# Patient Record
Sex: Male | Born: 1935 | Race: White | Hispanic: No | Marital: Single | Smoking: Former smoker
Health system: Southern US, Community
[De-identification: ages and names within clinical notes are randomized; demographics above are authoritative.]

## PROBLEM LIST (undated history)

## (undated) DIAGNOSIS — Z87442 Personal history of urinary calculi: Secondary | ICD-10-CM

## (undated) DIAGNOSIS — E785 Hyperlipidemia, unspecified: Secondary | ICD-10-CM

## (undated) DIAGNOSIS — G8929 Other chronic pain: Secondary | ICD-10-CM

## (undated) DIAGNOSIS — N400 Enlarged prostate without lower urinary tract symptoms: Secondary | ICD-10-CM

## (undated) DIAGNOSIS — G4730 Sleep apnea, unspecified: Secondary | ICD-10-CM

## (undated) DIAGNOSIS — N183 Chronic kidney disease, stage 3 unspecified: Secondary | ICD-10-CM

## (undated) DIAGNOSIS — I10 Essential (primary) hypertension: Secondary | ICD-10-CM

## (undated) DIAGNOSIS — K3189 Other diseases of stomach and duodenum: Secondary | ICD-10-CM

## (undated) DIAGNOSIS — Z86718 Personal history of other venous thrombosis and embolism: Secondary | ICD-10-CM

## (undated) DIAGNOSIS — K219 Gastro-esophageal reflux disease without esophagitis: Secondary | ICD-10-CM

## (undated) DIAGNOSIS — M549 Dorsalgia, unspecified: Secondary | ICD-10-CM

## (undated) DIAGNOSIS — E119 Type 2 diabetes mellitus without complications: Secondary | ICD-10-CM

## (undated) DIAGNOSIS — I2510 Atherosclerotic heart disease of native coronary artery without angina pectoris: Secondary | ICD-10-CM

## (undated) DIAGNOSIS — H9190 Unspecified hearing loss, unspecified ear: Secondary | ICD-10-CM

## (undated) DIAGNOSIS — I5032 Chronic diastolic (congestive) heart failure: Secondary | ICD-10-CM

## (undated) DIAGNOSIS — S72009A Fracture of unspecified part of neck of unspecified femur, initial encounter for closed fracture: Secondary | ICD-10-CM

## (undated) DIAGNOSIS — I4819 Other persistent atrial fibrillation: Secondary | ICD-10-CM

## (undated) DIAGNOSIS — L0390 Cellulitis, unspecified: Secondary | ICD-10-CM

## (undated) DIAGNOSIS — N390 Urinary tract infection, site not specified: Secondary | ICD-10-CM

## (undated) DIAGNOSIS — Z951 Presence of aortocoronary bypass graft: Secondary | ICD-10-CM

## (undated) DIAGNOSIS — J449 Chronic obstructive pulmonary disease, unspecified: Secondary | ICD-10-CM

## (undated) HISTORY — PX: CORONARY ARTERY BYPASS GRAFT: SHX141

## (undated) HISTORY — DX: Type 2 diabetes mellitus without complications: E11.9

## (undated) HISTORY — DX: Essential (primary) hypertension: I10

## (undated) HISTORY — DX: Atherosclerotic heart disease of native coronary artery without angina pectoris: I25.10

## (undated) HISTORY — DX: Hyperlipidemia, unspecified: E78.5

## (undated) HISTORY — DX: Personal history of other venous thrombosis and embolism: Z86.718

## (undated) HISTORY — DX: Presence of aortocoronary bypass graft: Z95.1

---

## 2000-12-17 ENCOUNTER — Ambulatory Visit (HOSPITAL_COMMUNITY): Admission: RE | Admit: 2000-12-17 | Discharge: 2000-12-17 | Payer: Self-pay | Admitting: Internal Medicine

## 2000-12-17 ENCOUNTER — Encounter: Payer: Self-pay | Admitting: *Deleted

## 2000-12-17 ENCOUNTER — Inpatient Hospital Stay (HOSPITAL_COMMUNITY): Admission: AD | Admit: 2000-12-17 | Discharge: 2000-12-23 | Payer: Self-pay | Admitting: Cardiology

## 2000-12-19 ENCOUNTER — Encounter: Payer: Self-pay | Admitting: Cardiology

## 2000-12-30 ENCOUNTER — Inpatient Hospital Stay (HOSPITAL_COMMUNITY): Admission: AD | Admit: 2000-12-30 | Discharge: 2001-01-05 | Payer: Self-pay | Admitting: *Deleted

## 2001-05-09 ENCOUNTER — Observation Stay (HOSPITAL_COMMUNITY): Admission: AD | Admit: 2001-05-09 | Discharge: 2001-05-11 | Payer: Self-pay | Admitting: Cardiology

## 2001-05-09 ENCOUNTER — Encounter: Payer: Self-pay | Admitting: Cardiology

## 2001-05-16 ENCOUNTER — Encounter: Payer: Self-pay | Admitting: Family Medicine

## 2001-05-16 ENCOUNTER — Ambulatory Visit (HOSPITAL_COMMUNITY): Admission: RE | Admit: 2001-05-16 | Discharge: 2001-05-16 | Payer: Self-pay | Admitting: Family Medicine

## 2002-01-05 HISTORY — PX: COLONOSCOPY: SHX174

## 2002-05-08 ENCOUNTER — Ambulatory Visit (HOSPITAL_COMMUNITY): Admission: RE | Admit: 2002-05-08 | Discharge: 2002-05-08 | Payer: Self-pay | Admitting: Internal Medicine

## 2002-06-29 ENCOUNTER — Ambulatory Visit (HOSPITAL_COMMUNITY): Admission: RE | Admit: 2002-06-29 | Discharge: 2002-06-30 | Payer: Self-pay | Admitting: Cardiology

## 2002-11-10 ENCOUNTER — Observation Stay (HOSPITAL_COMMUNITY): Admission: EM | Admit: 2002-11-10 | Discharge: 2002-11-10 | Payer: Self-pay | Admitting: Emergency Medicine

## 2009-01-05 HISTORY — PX: CORONARY ARTERY BYPASS GRAFT: SHX141

## 2009-01-28 ENCOUNTER — Inpatient Hospital Stay (HOSPITAL_COMMUNITY): Admission: EM | Admit: 2009-01-28 | Discharge: 2009-02-07 | Payer: Self-pay | Admitting: Emergency Medicine

## 2009-01-30 ENCOUNTER — Encounter (INDEPENDENT_AMBULATORY_CARE_PROVIDER_SITE_OTHER): Payer: Self-pay | Admitting: Cardiovascular Disease

## 2009-01-30 ENCOUNTER — Ambulatory Visit: Payer: Self-pay | Admitting: Cardiothoracic Surgery

## 2009-01-31 ENCOUNTER — Encounter: Payer: Self-pay | Admitting: Cardiothoracic Surgery

## 2009-02-27 ENCOUNTER — Encounter: Admission: RE | Admit: 2009-02-27 | Discharge: 2009-02-27 | Payer: Self-pay | Admitting: Cardiothoracic Surgery

## 2009-02-27 ENCOUNTER — Ambulatory Visit: Payer: Self-pay | Admitting: Cardiothoracic Surgery

## 2009-07-03 ENCOUNTER — Encounter: Admission: RE | Admit: 2009-07-03 | Discharge: 2009-07-03 | Payer: Self-pay | Admitting: Cardiovascular Disease

## 2010-03-23 LAB — POCT I-STAT 3, ART BLOOD GAS (G3+)
Acid-base deficit: 1 mmol/L (ref 0.0–2.0)
Acid-base deficit: 2 mmol/L (ref 0.0–2.0)
O2 Saturation: 85 %
O2 Saturation: 93 %
O2 Saturation: 94 %
O2 Saturation: 96 %
Patient temperature: 35.7
Patient temperature: 36.3
Patient temperature: 38
Patient temperature: 38
TCO2: 24 mmol/L (ref 0–100)
TCO2: 25 mmol/L (ref 0–100)
TCO2: 26 mmol/L (ref 0–100)
TCO2: 26 mmol/L (ref 0–100)
pCO2 arterial: 38.8 mmHg (ref 35.0–45.0)
pCO2 arterial: 43.1 mmHg (ref 35.0–45.0)
pCO2 arterial: 45.2 mmHg — ABNORMAL HIGH (ref 35.0–45.0)
pCO2 arterial: 46.3 mmHg — ABNORMAL HIGH (ref 35.0–45.0)
pH, Arterial: 7.321 — ABNORMAL LOW (ref 7.350–7.450)
pH, Arterial: 7.339 — ABNORMAL LOW (ref 7.350–7.450)
pH, Arterial: 7.396 (ref 7.350–7.450)
pH, Arterial: 7.412 (ref 7.350–7.450)

## 2010-03-23 LAB — POCT I-STAT, CHEM 8
BUN: 13 mg/dL (ref 6–23)
BUN: 13 mg/dL (ref 6–23)
Calcium, Ion: 1.12 mmol/L (ref 1.12–1.32)
Calcium, Ion: 1.16 mmol/L (ref 1.12–1.32)
Chloride: 101 mEq/L (ref 96–112)
Creatinine, Ser: 0.9 mg/dL (ref 0.4–1.5)
Glucose, Bld: 173 mg/dL — ABNORMAL HIGH (ref 70–99)
Glucose, Bld: 223 mg/dL — ABNORMAL HIGH (ref 70–99)
Hemoglobin: 10.5 g/dL — ABNORMAL LOW (ref 13.0–17.0)
TCO2: 24 mmol/L (ref 0–100)

## 2010-03-23 LAB — CBC
HCT: 29.7 % — ABNORMAL LOW (ref 39.0–52.0)
HCT: 30.5 % — ABNORMAL LOW (ref 39.0–52.0)
HCT: 31 % — ABNORMAL LOW (ref 39.0–52.0)
HCT: 31.3 % — ABNORMAL LOW (ref 39.0–52.0)
HCT: 32.9 % — ABNORMAL LOW (ref 39.0–52.0)
HCT: 36.7 % — ABNORMAL LOW (ref 39.0–52.0)
HCT: 36.8 % — ABNORMAL LOW (ref 39.0–52.0)
HCT: 38.1 % — ABNORMAL LOW (ref 39.0–52.0)
HCT: 40.4 % (ref 39.0–52.0)
Hemoglobin: 10 g/dL — ABNORMAL LOW (ref 13.0–17.0)
Hemoglobin: 10.2 g/dL — ABNORMAL LOW (ref 13.0–17.0)
Hemoglobin: 10.3 g/dL — ABNORMAL LOW (ref 13.0–17.0)
Hemoglobin: 10.4 g/dL — ABNORMAL LOW (ref 13.0–17.0)
Hemoglobin: 12.8 g/dL — ABNORMAL LOW (ref 13.0–17.0)
Hemoglobin: 12.9 g/dL — ABNORMAL LOW (ref 13.0–17.0)
Hemoglobin: 13.3 g/dL (ref 13.0–17.0)
Hemoglobin: 13.6 g/dL (ref 13.0–17.0)
Hemoglobin: 13.7 g/dL (ref 13.0–17.0)
MCHC: 33.2 g/dL (ref 30.0–36.0)
MCHC: 33.3 g/dL (ref 30.0–36.0)
MCHC: 33.6 g/dL (ref 30.0–36.0)
MCHC: 33.8 g/dL (ref 30.0–36.0)
MCHC: 34.8 g/dL (ref 30.0–36.0)
MCHC: 34.9 g/dL (ref 30.0–36.0)
MCV: 85.6 fL (ref 78.0–100.0)
MCV: 85.8 fL (ref 78.0–100.0)
MCV: 86.7 fL (ref 78.0–100.0)
MCV: 87.6 fL (ref 78.0–100.0)
MCV: 87.9 fL (ref 78.0–100.0)
MCV: 88.2 fL (ref 78.0–100.0)
Platelets: 109 10*3/uL — ABNORMAL LOW (ref 150–400)
Platelets: 112 10*3/uL — ABNORMAL LOW (ref 150–400)
Platelets: 113 10*3/uL — ABNORMAL LOW (ref 150–400)
Platelets: 123 10*3/uL — ABNORMAL LOW (ref 150–400)
Platelets: 132 10*3/uL — ABNORMAL LOW (ref 150–400)
Platelets: 139 10*3/uL — ABNORMAL LOW (ref 150–400)
Platelets: 148 10*3/uL — ABNORMAL LOW (ref 150–400)
Platelets: 157 10*3/uL (ref 150–400)
Platelets: 215 10*3/uL (ref 150–400)
RBC: 3.38 MIL/uL — ABNORMAL LOW (ref 4.22–5.81)
RBC: 3.46 MIL/uL — ABNORMAL LOW (ref 4.22–5.81)
RBC: 3.54 MIL/uL — ABNORMAL LOW (ref 4.22–5.81)
RBC: 3.61 MIL/uL — ABNORMAL LOW (ref 4.22–5.81)
RBC: 4.28 MIL/uL (ref 4.22–5.81)
RBC: 4.28 MIL/uL (ref 4.22–5.81)
RBC: 4.44 MIL/uL (ref 4.22–5.81)
RDW: 13 % (ref 11.5–15.5)
RDW: 13.3 % (ref 11.5–15.5)
RDW: 13.4 % (ref 11.5–15.5)
RDW: 13.4 % (ref 11.5–15.5)
RDW: 13.4 % (ref 11.5–15.5)
RDW: 13.4 % (ref 11.5–15.5)
RDW: 13.5 % (ref 11.5–15.5)
RDW: 13.6 % (ref 11.5–15.5)
RDW: 13.6 % (ref 11.5–15.5)
WBC: 10.2 10*3/uL (ref 4.0–10.5)
WBC: 10.4 10*3/uL (ref 4.0–10.5)
WBC: 10.9 10*3/uL — ABNORMAL HIGH (ref 4.0–10.5)
WBC: 5.1 10*3/uL (ref 4.0–10.5)
WBC: 6.5 10*3/uL (ref 4.0–10.5)
WBC: 7.6 10*3/uL (ref 4.0–10.5)
WBC: 9.2 10*3/uL (ref 4.0–10.5)
WBC: 9.4 10*3/uL (ref 4.0–10.5)

## 2010-03-23 LAB — BASIC METABOLIC PANEL
BUN: 10 mg/dL (ref 6–23)
BUN: 12 mg/dL (ref 6–23)
BUN: 16 mg/dL (ref 6–23)
BUN: 22 mg/dL (ref 6–23)
CO2: 26 mEq/L (ref 19–32)
CO2: 31 mEq/L (ref 19–32)
Calcium: 7.8 mg/dL — ABNORMAL LOW (ref 8.4–10.5)
Calcium: 8 mg/dL — ABNORMAL LOW (ref 8.4–10.5)
Calcium: 8.9 mg/dL (ref 8.4–10.5)
Chloride: 100 mEq/L (ref 96–112)
Chloride: 106 mEq/L (ref 96–112)
Creatinine, Ser: 0.7 mg/dL (ref 0.4–1.5)
Creatinine, Ser: 0.86 mg/dL (ref 0.4–1.5)
Creatinine, Ser: 0.91 mg/dL (ref 0.4–1.5)
GFR calc Af Amer: >60 mL/min (ref >60)
GFR calc Af Amer: >60 mL/min (ref >60)
GFR calc non Af Amer: >60 mL/min (ref >60)
GFR calc non Af Amer: >60 mL/min (ref >60)
GFR calc non Af Amer: >60 mL/min (ref >60)
GFR calc non Af Amer: >60 mL/min (ref >60)
Glucose, Bld: 129 mg/dL — ABNORMAL HIGH (ref 70–99)
Glucose, Bld: 145 mg/dL — ABNORMAL HIGH (ref 70–99)
Glucose, Bld: 173 mg/dL — ABNORMAL HIGH (ref 70–99)
Potassium: 3.5 mEq/L (ref 3.5–5.1)
Potassium: 3.5 mEq/L (ref 3.5–5.1)
Potassium: 4 mEq/L (ref 3.5–5.1)
Sodium: 135 mEq/L (ref 135–145)
Sodium: 137 mEq/L (ref 135–145)
Sodium: 137 mEq/L (ref 135–145)
Sodium: 142 mEq/L (ref 135–145)

## 2010-03-23 LAB — BLOOD GAS, ARTERIAL
Acid-Base Excess: 2.6 mmol/L — ABNORMAL HIGH (ref 0.0–2.0)
Bicarbonate: 27 mEq/L — ABNORMAL HIGH (ref 20.0–24.0)
Drawn by: 312761
O2 Content: 3.5 L/min
O2 Saturation: 97.1 %
Patient temperature: 98.4
TCO2: 28.3 mmol/L (ref 0–100)
pCO2 arterial: 44.3 mmHg (ref 35.0–45.0)
pH, Arterial: 7.401 (ref 7.350–7.450)
pO2, Arterial: 80.5 mmHg (ref 80.0–100.0)

## 2010-03-23 LAB — COMPREHENSIVE METABOLIC PANEL
ALT: 20 U/L (ref 0–53)
AST: 17 U/L (ref 0–37)
AST: 19 U/L (ref 0–37)
Albumin: 2.9 g/dL — ABNORMAL LOW (ref 3.5–5.2)
Albumin: 3.3 g/dL — ABNORMAL LOW (ref 3.5–5.2)
Alkaline Phosphatase: 51 U/L (ref 39–117)
BUN: 12 mg/dL (ref 6–23)
CO2: 27 mEq/L (ref 19–32)
Calcium: 8.6 mg/dL (ref 8.4–10.5)
Calcium: 9.2 mg/dL (ref 8.4–10.5)
Chloride: 100 mEq/L (ref 96–112)
Chloride: 100 mEq/L (ref 96–112)
Creatinine, Ser: 0.71 mg/dL (ref 0.4–1.5)
Creatinine, Ser: 0.9 mg/dL (ref 0.4–1.5)
GFR calc Af Amer: >60 mL/min (ref >60)
GFR calc Af Amer: >60 mL/min (ref >60)
GFR calc non Af Amer: >60 mL/min (ref >60)
Glucose, Bld: 189 mg/dL — ABNORMAL HIGH (ref 70–99)
Potassium: 3.4 mEq/L — ABNORMAL LOW (ref 3.5–5.1)
Sodium: 134 mEq/L — ABNORMAL LOW (ref 135–145)
Total Bilirubin: 1 mg/dL (ref 0.3–1.2)
Total Protein: 5.7 g/dL — ABNORMAL LOW (ref 6.0–8.3)
Total Protein: 6 g/dL (ref 6.0–8.3)

## 2010-03-23 LAB — CARDIAC PANEL(CRET KIN+CKTOT+MB+TROPI)
CK, MB: 0.4 ng/mL (ref 0.3–4.0)
CK, MB: 0.8 ng/mL (ref 0.3–4.0)
Relative Index: INVALID (ref 0.0–2.5)
Relative Index: INVALID (ref 0.0–2.5)
Total CK: 34 U/L (ref 7–232)
Total CK: 47 U/L (ref 7–232)
Troponin I: 0.01 ng/mL (ref 0.00–0.06)
Troponin I: 0.02 ng/mL (ref 0.00–0.06)
Troponin I: 0.02 ng/mL (ref 0.00–0.06)

## 2010-03-23 LAB — DIFFERENTIAL
Basophils Absolute: 0 10*3/uL (ref 0.0–0.1)
Basophils Absolute: 0 10*3/uL (ref 0.0–0.1)
Basophils Relative: 0 % (ref 0–1)
Eosinophils Absolute: 0 10*3/uL (ref 0.0–0.7)
Eosinophils Relative: 0 % (ref 0–5)
Eosinophils Relative: 1 % (ref 0–5)
Eosinophils Relative: 1 % (ref 0–5)
Lymphocytes Relative: 15 % (ref 12–46)
Lymphocytes Relative: 7 % — ABNORMAL LOW (ref 12–46)
Lymphocytes Relative: 7 % — ABNORMAL LOW (ref 12–46)
Lymphs Abs: 0.5 10*3/uL — ABNORMAL LOW (ref 0.7–4.0)
Lymphs Abs: 0.6 10*3/uL — ABNORMAL LOW (ref 0.7–4.0)
Monocytes Absolute: 0.7 10*3/uL (ref 0.1–1.0)
Monocytes Absolute: 0.7 10*3/uL (ref 0.1–1.0)
Monocytes Relative: 9 % (ref 3–12)
Neutro Abs: 6.4 10*3/uL (ref 1.7–7.7)
Neutrophils Relative %: 84 % — ABNORMAL HIGH (ref 43–77)

## 2010-03-23 LAB — URINALYSIS, ROUTINE W REFLEX MICROSCOPIC
Bilirubin Urine: NEGATIVE
Glucose, UA: 250 mg/dL — AB
Hgb urine dipstick: NEGATIVE
Ketones, ur: NEGATIVE mg/dL
Leukocytes, UA: NEGATIVE
Nitrite: NEGATIVE
Protein, ur: 30 mg/dL — AB
Specific Gravity, Urine: 1.029 (ref 1.005–1.030)
Urobilinogen, UA: 1 mg/dL (ref 0.0–1.0)
pH: 5 (ref 5.0–8.0)

## 2010-03-23 LAB — MAGNESIUM
Magnesium: 1.9 mg/dL (ref 1.5–2.5)
Magnesium: 2.3 mg/dL (ref 1.5–2.5)
Magnesium: 2.6 mg/dL — ABNORMAL HIGH (ref 1.5–2.5)

## 2010-03-23 LAB — CROSSMATCH
ABO/RH(D): O NEG
Antibody Screen: NEGATIVE

## 2010-03-23 LAB — CREATININE, SERUM
Creatinine, Ser: 0.85 mg/dL (ref 0.4–1.5)
Creatinine, Ser: 0.9 mg/dL (ref 0.4–1.5)
GFR calc Af Amer: >60 mL/min (ref >60)
GFR calc Af Amer: >60 mL/min (ref >60)
GFR calc non Af Amer: >60 mL/min (ref >60)
GFR calc non Af Amer: >60 mL/min (ref >60)

## 2010-03-23 LAB — POCT I-STAT 4, (NA,K, GLUC, HGB,HCT)
Glucose, Bld: 135 mg/dL — ABNORMAL HIGH (ref 70–99)
Glucose, Bld: 165 mg/dL — ABNORMAL HIGH (ref 70–99)
Glucose, Bld: 173 mg/dL — ABNORMAL HIGH (ref 70–99)
Glucose, Bld: 180 mg/dL — ABNORMAL HIGH (ref 70–99)
Glucose, Bld: 214 mg/dL — ABNORMAL HIGH (ref 70–99)
HCT: 26 % — ABNORMAL LOW (ref 39.0–52.0)
HCT: 31 % — ABNORMAL LOW (ref 39.0–52.0)
HCT: 31 % — ABNORMAL LOW (ref 39.0–52.0)
HCT: 34 % — ABNORMAL LOW (ref 39.0–52.0)
Hemoglobin: 11.6 g/dL — ABNORMAL LOW (ref 13.0–17.0)
Hemoglobin: 8.8 g/dL — ABNORMAL LOW (ref 13.0–17.0)
Hemoglobin: 8.8 g/dL — ABNORMAL LOW (ref 13.0–17.0)
Hemoglobin: 9.2 g/dL — ABNORMAL LOW (ref 13.0–17.0)
Potassium: 3.5 mEq/L (ref 3.5–5.1)
Potassium: 3.7 mEq/L (ref 3.5–5.1)
Potassium: 3.7 mEq/L (ref 3.5–5.1)
Potassium: 3.7 mEq/L (ref 3.5–5.1)
Potassium: 4 mEq/L (ref 3.5–5.1)
Sodium: 132 mEq/L — ABNORMAL LOW (ref 135–145)
Sodium: 132 mEq/L — ABNORMAL LOW (ref 135–145)
Sodium: 133 mEq/L — ABNORMAL LOW (ref 135–145)
Sodium: 138 mEq/L (ref 135–145)

## 2010-03-23 LAB — MRSA PCR SCREENING: MRSA by PCR: NEGATIVE

## 2010-03-23 LAB — GLUCOSE, CAPILLARY
Glucose-Capillary: 125 mg/dL — ABNORMAL HIGH (ref 70–99)
Glucose-Capillary: 139 mg/dL — ABNORMAL HIGH (ref 70–99)
Glucose-Capillary: 140 mg/dL — ABNORMAL HIGH (ref 70–99)
Glucose-Capillary: 152 mg/dL — ABNORMAL HIGH (ref 70–99)
Glucose-Capillary: 163 mg/dL — ABNORMAL HIGH (ref 70–99)
Glucose-Capillary: 184 mg/dL — ABNORMAL HIGH (ref 70–99)
Glucose-Capillary: 185 mg/dL — ABNORMAL HIGH (ref 70–99)
Glucose-Capillary: 190 mg/dL — ABNORMAL HIGH (ref 70–99)
Glucose-Capillary: 237 mg/dL — ABNORMAL HIGH (ref 70–99)
Glucose-Capillary: 94 mg/dL (ref 70–99)

## 2010-03-23 LAB — PREPARE FRESH FROZEN PLASMA

## 2010-03-23 LAB — CULTURE, BLOOD (ROUTINE X 2)
Culture: NO GROWTH
Culture: NO GROWTH

## 2010-03-23 LAB — POCT I-STAT 3, VENOUS BLOOD GAS (G3P V)
Bicarbonate: 29.7 mEq/L — ABNORMAL HIGH (ref 20.0–24.0)
pCO2, Ven: 49.2 mmHg (ref 45.0–50.0)
pH, Ven: 7.389 — ABNORMAL HIGH (ref 7.250–7.300)

## 2010-03-23 LAB — HEMOGLOBIN AND HEMATOCRIT, BLOOD
HCT: 26.5 % — ABNORMAL LOW (ref 39.0–52.0)
Hemoglobin: 9.3 g/dL — ABNORMAL LOW (ref 13.0–17.0)

## 2010-03-23 LAB — HEPARIN LEVEL (UNFRACTIONATED)
Heparin Unfractionated: 0.1 IU/mL — ABNORMAL LOW (ref 0.30–0.70)
Heparin Unfractionated: 0.51 IU/mL (ref 0.30–0.70)

## 2010-03-23 LAB — LIPID PANEL: VLDL: 16 mg/dL (ref 0–40)

## 2010-03-23 LAB — PROTIME-INR
INR: 1.01 (ref 0.00–1.49)
Prothrombin Time: 13.2 seconds (ref 11.6–15.2)

## 2010-03-23 LAB — HEMOGLOBIN A1C
Hgb A1c MFr Bld: 12 % — ABNORMAL HIGH (ref 4.6–6.1)
Mean Plasma Glucose: 298 mg/dL

## 2010-03-23 LAB — URINE MICROSCOPIC-ADD ON

## 2010-03-23 LAB — PREPARE PLATELETS

## 2010-03-23 LAB — APTT: aPTT: 139 seconds — ABNORMAL HIGH (ref 24–37)

## 2010-03-24 LAB — CARDIAC PANEL(CRET KIN+CKTOT+MB+TROPI)
CK, MB: 1.2 ng/mL (ref 0.3–4.0)
Relative Index: INVALID (ref 0.0–2.5)
Relative Index: INVALID (ref 0.0–2.5)
Total CK: 47 U/L (ref 7–232)
Total CK: 58 U/L (ref 7–232)
Troponin I: 0.02 ng/mL (ref 0.00–0.06)

## 2010-03-24 LAB — COMPREHENSIVE METABOLIC PANEL
AST: 21 U/L (ref 0–37)
Alkaline Phosphatase: 79 U/L (ref 39–117)
BUN: 13 mg/dL (ref 6–23)
CO2: 30 mEq/L (ref 19–32)
Chloride: 99 mEq/L (ref 96–112)
Creatinine, Ser: 0.9 mg/dL (ref 0.4–1.5)
GFR calc Af Amer: >60 mL/min (ref >60)
GFR calc non Af Amer: >60 mL/min (ref >60)
Potassium: 3.8 mEq/L (ref 3.5–5.1)
Total Bilirubin: 0.4 mg/dL (ref 0.3–1.2)

## 2010-03-24 LAB — CBC
HCT: 42.5 % (ref 39.0–52.0)
MCV: 85.8 fL (ref 78.0–100.0)
RBC: 4.96 MIL/uL (ref 4.22–5.81)
WBC: 5.2 10*3/uL (ref 4.0–10.5)

## 2010-03-24 LAB — URINE MICROSCOPIC-ADD ON

## 2010-03-24 LAB — URINALYSIS, ROUTINE W REFLEX MICROSCOPIC
Bilirubin Urine: NEGATIVE
Glucose, UA: 500 mg/dL — AB
Hgb urine dipstick: NEGATIVE
Protein, ur: 30 mg/dL — AB
Specific Gravity, Urine: 1.025 (ref 1.005–1.030)
Urobilinogen, UA: 0.2 mg/dL (ref 0.0–1.0)

## 2010-03-24 LAB — GLUCOSE, CAPILLARY
Glucose-Capillary: 197 mg/dL — ABNORMAL HIGH (ref 70–99)
Glucose-Capillary: 489 mg/dL — ABNORMAL HIGH (ref 70–99)

## 2010-03-24 LAB — DIFFERENTIAL
Basophils Absolute: 0 10*3/uL (ref 0.0–0.1)
Basophils Relative: 1 % (ref 0–1)
Eosinophils Absolute: 0 10*3/uL (ref 0.0–0.7)
Eosinophils Relative: 1 % (ref 0–5)
Lymphocytes Relative: 14 % (ref 12–46)
Monocytes Absolute: 0.8 10*3/uL (ref 0.1–1.0)

## 2010-03-24 LAB — POCT CARDIAC MARKERS: Troponin i, poc: <0.05 ng/mL (ref 0.00–0.09)

## 2010-03-24 LAB — GLUCOSE, RANDOM: Glucose, Bld: 444 mg/dL — ABNORMAL HIGH (ref 70–99)

## 2010-03-26 LAB — GLUCOSE, CAPILLARY
Glucose-Capillary: 135 mg/dL — ABNORMAL HIGH (ref 70–99)
Glucose-Capillary: 140 mg/dL — ABNORMAL HIGH (ref 70–99)
Glucose-Capillary: 140 mg/dL — ABNORMAL HIGH (ref 70–99)
Glucose-Capillary: 155 mg/dL — ABNORMAL HIGH (ref 70–99)

## 2010-03-26 LAB — BASIC METABOLIC PANEL
BUN: 23 mg/dL (ref 6–23)
CO2: 28 mEq/L (ref 19–32)
Calcium: 9.9 mg/dL (ref 8.4–10.5)
Chloride: 100 mEq/L (ref 96–112)
Creatinine, Ser: 0.8 mg/dL (ref 0.4–1.5)
GFR calc Af Amer: >60 mL/min (ref >60)
GFR calc non Af Amer: >60 mL/min (ref >60)
Glucose, Bld: 154 mg/dL — ABNORMAL HIGH (ref 70–99)
Potassium: 3.8 mEq/L (ref 3.5–5.1)
Sodium: 138 mEq/L (ref 135–145)

## 2010-05-20 NOTE — Assessment & Plan Note (Signed)
OFFICE VISIT   NAHMIR, ZEIDMAN  DOB:  1931-08-24                                        February 27, 2009  CHART #:  [id number]   CURRENT PROBLEMS:  1. Status post coronary artery bypass graft x4, January 31, 2009.  2. Diabetes mellitus.  3. Transient postoperative thrombocytopenia.   PRESENT ILLNESS:  The patient is a 77 year old Caucasian male nonsmoking  diabetic, returns for his first office visit after multivessel bypass  grafting performed approximately 1 month ago.  At that time, he  presented with unstable angina and had a history of previous coronary  stents placed 4 or 5 years ago.  At cardiac cath, he was found to have a  90% to 95% stenosis of the LAD positioned proximal to the previous stent  as well as high-grade stenosis of the diagonal and moderate disease of  the circumflex and right coronary.  He underwent left IMA graft to the  LAD and vein grafts to the diagonal, circumflex marginal, and posterior  descending branch of right coronary.  Postoperatively, he did well and  maintained in sinus rhythm and was discharged home on the sixth  postoperative day.  He has continued to do well at home, although  recently he has developed symptoms of an upper respiratory infection  with nasal congestion, cough, and sore throat.   CURRENT MEDICATIONS:  1. Lantus 25 units at bedtime.  2. Lopressor 75 mg b.i.d.  3. Metformin 500 mg b.i.d.  4. Lipitor 40 mg a day.  5. Aspirin 1 tablet a day.  6. Lisinopril HCT 20/12.5 mg daily.   PHYSICAL EXAMINATION:  Blood pressure 135/70, pulse 80, respirations 18,  saturation 96% on room air, temperature 99.4.  He has a nasal quality to  his voice from congestion.  His breath sounds, however, are equal and  clear.  His sternal incision is well healed, and his cardiac rhythm is  regular without gallop or rub.  His leg incisions are healing, and there  is no significant peripheral edema.   DIAGNOSTIC TESTS:   A PA and lateral chest x-ray reveals clear lung  fields without pleural effusion.  The sternal wires are intact, and the  heart size is stable.   IMPRESSION AND PLAN:  The patient has done well now almost 1 month after  surgery.  He is ready to resume driving and light activities.  He knows  not to lift more than 20 pounds until 3 months after surgery.  He knows  he should maintain his current medications.  I also asked that he  contact the rehab program at [facility] for post-CABG  outpatient rehab.  He has not had any significant pain issues and is not  in need of any pain medication.  I did prescribe him some Duratuss cough  medicine, Mucinex, and Z-Pak for his upper respiratory infection and  sore throat.   Kerin Perna, M.D.  Electronically Signed   PV/MEDQ  D:  02/27/2009  T:  02/27/2009  Job:  [id number]   cc:   Ritta Slot, MD  Melvyn Novas, MD

## 2010-05-21 ENCOUNTER — Other Ambulatory Visit (HOSPITAL_COMMUNITY): Payer: Self-pay | Admitting: Internal Medicine

## 2010-05-21 ENCOUNTER — Ambulatory Visit (HOSPITAL_COMMUNITY)
Admission: RE | Admit: 2010-05-21 | Discharge: 2010-05-21 | Disposition: A | Discharge status: Home / Self Care | Payer: Medicare Other | Source: Ambulatory Visit | Attending: Internal Medicine | Admitting: Internal Medicine

## 2010-05-21 DIAGNOSIS — R600 Localized edema: Secondary | ICD-10-CM

## 2010-05-21 DIAGNOSIS — M79609 Pain in unspecified limb: Secondary | ICD-10-CM | POA: Insufficient documentation

## 2010-05-21 DIAGNOSIS — M7989 Other specified soft tissue disorders: Secondary | ICD-10-CM | POA: Insufficient documentation

## 2010-05-23 NOTE — Discharge Summary (Signed)
. [facility]  Patient:    [name], [name] Visit Number: [id number] MRN: [id number]          Service Type: EMS Location: [facility] Attending Physician:  Sandi Raveling Dictated by:   Darcella Gasman Ingold, F.N.P.C. Admit Date:  12/30/2000 Discharge Date: 12/30/2000   CC:         Lenise Herald, M.D.  Dr. Nechama Guard, M.D., [address], [place], [place] [address]   Discharge Summary  DISCHARGE DIAGNOSES:  1. Deep venous thrombosis left leg.  2. Anemia, stable with negative Hemoccult.  3. Coronary artery disease with recent staged Percutaneous coronary     intervention.  4. Non-insulin-dependent diabetes mellitus.  5. Anticoagulation with Coumadin.  DISCHARGE CONDITION: Improved and stable.  PROCEDURES:  None.  DISCHARGE MEDICATIONS:  1. Coumadin 5 mg daily.  2. Avandia 4 mg one q.d.  3. Glucotrol 10 mg one q.d.  4. Aspirin 325 mg daily.  5. Altace 10 mg daily.  6. Protonix 40 mg daily.  7. Lopressor 50 mg b.i.d.  8. Lipitor 40 mg daily.  9. Zetia 10 mg one q.d.  10. Nitroglycerin 0.4 mg as needed. 11. Stop Plavix.  DISCHARGE INSTRUCTIONS:  ACTIVITY:  Limited activity and weight-bearing for one to two weeks, keep leg elevated.  DIET:  Low salt, low fat, low cholesterol diabetic diet.  FOLLOW UP:  Will need blood work, protime done on Friday 01/07/01. See Dr. Laurena Slimmer in two weeks. Call the office to set up an appointment.  HISTORY OF PRESENT ILLNESS:  The patient is a 77 year old white married male with past medical history of non-insulin-dependent diabetes mellitus, hypertension and elevated lipids who was recently evaluated for chest pain. He had a positive Cardiolite and underwent cardiac catheterization with initial catheterization revealing high grade left anterior descending lesion as well as mid right coronary artery lesion.  The patient underwent staged interventions to the left anterior descending on  12/20/00 followed by intervention to the right coronary artery on 12/22/00.  The patient presented to the office of Dr. Laurena Slimmer in [place] on 12/30/00 with complaints of left leg pain and swelling which had been present since discharge.  The pain was unchanged though the pain may be slightly worse according to the patients wife, ambulation had been difficult and painful. The patient denied any chest pain, shortness of breath, dizziness, palpitations.  No fever, chills, nausea or vomiting.  No left back or flank discomfort.  The patient was admitted to [facility] and Doppler studies were done.  PAST MEDICAL HISTORY: Coronary artery disease as described, hypertension, hyperlipidemia, diabetes.  OUTPATIENT MEDICATIONS:  1. Glucotrol XL 10 mg daily.  2. Enteric coated aspirin 325 mg q.d.  3. Altace 10 mg.  4. Plavix 75 mg.  5. Protonix 40 mg.  6. Lopressor 50 b.i.d.  7. Lipitor 40 mg q.h.s.  8. Zetia 10 mg daily.  9. Nitroglycerin p.r.n. 10. Patient was on Avandia but the family did not have medications with them     in the office.  REVIEW OF SYSTEMS, SOCIAL HISTORY, FAMILY HISTORY:  Please see history and physical.  ALLERGIES:  No known drug allergies.  PHYSICAL EXAMINATION AT DISCHARGE:  VITAL SIGNS: Blood pressure 114/58, pulse 68, respirations 20, temperature 98. Room air oxygen saturation 96%.  LUNGS:  Clear to auscultation bilaterally.  HEART:  Regular rate and rhythm.  EXTREMITIES:  Moderate edema of left leg and foot to the mid thigh.  LABORATORY DATA:  (ADMISSION)  CBC:  Admission hemoglobin  10.5, hematocrit 30.5, white blood cell count 7.2, platelet count 297K, neutrophils 72, lymphocytes 16, monos 7, eosinophils 3, basophils 2.  Hemoglobin at discharge was 10.3 and hematocrit 30.2.  PROTIME: On admission 13.1, INR 1.  PTT 35 on heparin the PTT was therapeutic. Prior to discharge protime 24.2, INR 2.8 and PTT 65.  CHEMISTRIES:  Sodium 137,  potassium 4.1, chloride 102, cO2 30, glucose 103, BUN 12, creatinine 0.8, calcium 9.6, total protein 6.9, albumin 3.7, AST 17, ALT 19, ALP 71, total bilirubin 0.3. Glucose was elevated until Avandia was restarted with more improvement in Accu-Chek control.  THYROID:  TSH 0.613.  CEA:  0.9.  PSA:  2.18.  ELECTROCARDIOGRAM:  On admission sinus rhythm, normal electrocardiogram. Follow up on 12/31/00: sinus rhythm, first degree aV block, ______ 0.218. Otherwise normal.  VENOUS DOPPLER STUDY:  Appears to be occluding deep venous thrombosis in the left common femoral, superficial femoral, profunda and popliteal veins.  No evidence of superficial thrombus or Bakers cyst bilaterally or deep venous thrombosis in the right lower extremity.  HOSPITAL COURSE:  The patient was admitted by Dr. Laurena Slimmer 12/30/00 for left lower leg edema.  Doppler studies were done which were positive for large deep venous thrombosis in the left system.  The patient was placed on intravenous heparin, intravenous Coumadin was started.  The patient is anemic.  Hemoccults had been negative so Coumadin was continued.  The patient was otherwise asymptomatic.  The leg remained swollen in the hospital even with elevation.  The patient continued to be stable with no chest pain and by 01/05/01 the patient was ready for discharge.  His INR was therapeutic at 2.8.  There had been recommendations to have cross over Lovenox and Coumadin for two to three days with therapeutic Coumadin but the patient stated he could not afford the Lovenox and refused to be on it.  Therefore, the patient was discharged on Coumadin, his Plavix was discontinued and he will follow up on Friday, 01/07/01 for outpatient protime and further management of his Coumadin.  The patient  was discharged by Dr. Elsie Lincoln in stable condition. Dictated by:   Darcella Gasman Ingold, F.N.P.C. Attending Physician:  Sandi Raveling DD:  01/18/01 TD:  01/18/01 Job:  [id number]

## 2010-05-23 NOTE — Procedures (Signed)
[facility]  Patient:    [name], [name] Visit Number: [id number] MRN: [id number]          Service Type: MED Location: 2000 2007 01 Attending Physician:  Pamella Pert Dictated by:   Sherral Hammers, M.D. Proc. Date: 12/17/00 Admit Date:  12/17/2000   CC:         John Giovanni, M.D.   Stress Test  HISTORY OF PRESENT ILLNESS:  Mr. Ohanesian is a 77 year old white male who began to experience chest pain approximately two and one-half weeks ago.  He reports that it is predictable with exertion, resolves with rest.  It is associated with shortness of breath and occasional nausea but no diaphoresis or vomiting.  He is, therefore, referred for nuclear stress today.  PERSANTINE INFUSION 70M CARDIOLITE MYOCARDIAL PERFUSION STUDY: The patient performed the standard rest/pharmacologic stress protocol.  ELECTROCARDIOGRAPHIC/HEMADYNAMIC DATA: The resting blood pressure was 150/80 with a pulse of 67.  Blood pressure increased to a peak of 180/74 with peak heart rate of 103.  The patient experienced chest pain similar to what he has been experiencing in the past two and one-half weeks.  He graded this chest pain at 8/10.  Baseline 12-lead electrocardiogram: Normal sinus rhythm with early transition. Nonspecific ST changes are present, mot notably in V5 and V6.  With peak stress, his electrocardiogram had approximately 1 to 2 mm ST depression in V5, V6, lead II.  This ST depression was downsloping.  It was notably worse during recovery.  It required approximately 5 to 6 minutes for his heart rate and blood pressure to return to normal.  Additionally, it required 1 sublingual nitroglycerin to relieve the chest pain and for normalization of his EKG.  OVERALL IMPRESSION: 1. Clinically positive for angina. 2. Electrocardiogram positive for ischemia. 3. Cine angiographic images are pending. Dictated by:   Sherral Hammers, M.D. Attending Physician:  Pamella Pert DD:  12/17/00 TD:  12/17/00 Job: [id number]

## 2010-05-23 NOTE — Cardiovascular Report (Signed)
NAME:  [name], [name]                         ACCOUNT NO.:  [id number]   MEDICAL RECORD NO.:  [id number]                   PATIENT TYPE:  INP   LOCATION:  1826                                 FACILITY:  [facility]   PHYSICIAN:  Cristy Hilts. Jacinto Halim, M.D.                  DATE OF BIRTH:  08/06/1932   DATE OF PROCEDURE:  11/10/2002  DATE OF DISCHARGE:  11/10/2002                              CARDIAC CATHETERIZATION   REFERRING PHYSICIAN:  Mila Homer. Sudie Bailey, M.D.   PROCEDURES PERFORMED:  1. Left ventriculography.  2. Selective left and right coronary arteriography.   CARDIOLOGIST:  Pamella Pert, M.D.   INDICATIONS:  [name] is a 77 year old Caucasian male with history of  hypertension, diabetes and hyperlipidemia who developed chest pain with  radiation to his left arm about two weeks ago and has been having on and off  numbness in his left arm.  He has significant coronary disease for which he  has undergone PTCA and stenting to the RCA on December 22, 2000, and PTCA  and stenting to the LAD on December 20, 2000.  This LAD stent had restenosed  and he received brachytherapy on May 10, 2001.  He had restenosis again in  the proximal edge of the stent for which he underwent cutting balloon PTCA  on June 29, 2002.   Given his significant history we thought about restenosis and he is brought  back to the cardiac catheterization laboratory for evaluation of his  coronary anatomy.  He was initially seen in the office and was directly sent  to the hospital.   HEMODYNAMIC DATA:  The left ventricular pressures were 111/7 with an end-  diastolic pressure of 16 mmHg.  The aortic pressures were 106/60 with a mean  of 81 mmHg.  There was no pressure gradient across the aortic valve.   ANGIOGRAPHIC DATA:  Left Ventricle:  Left ventricular systolic function was  normal.  The ejection fraction was estimated at 60%.   Right Coronary Artery:  Right coronary artery is a large caliber vessel.  The previously placed stent in the mid RCA is widely patent.  The proximal  RCA had 10-20% luminal narrowing.  This is unchanged from prior cardiac  catheterization.   Left Main Coronary Artery:  The left main coronary artery is a small-to-  moderate caliber vessel and has diffuse disease.  There is no significant  luminal obstruction.   Left Anterior Descending Artery:  The left anterior descending artery has  moderate diffuse disease in the proximal segment; however, the previously  angioplastied proximal segment and also the stent in the mid LAD is widely  patent.  In fact, the diagonal-2, which had a 90-95% stenosis on previous  cardiac catheterization now appears to have reversed, remodeled and  presently looks like 80% stenosis.   Circumflex Artery:  The circumflex artery is a large caliber vessel.  It is  smooth and it has mild noncritical disease.   IMPRESSION:  1. Widely patent right coronary artery stent placed on December 22, 2000.  2. Widely patent left anterior descending stent placed on December 20, 2000.     The restenotic site that was reangioplastied with a cutting balloon is     widely patent.  The proximal left anterior descending has diffuse disease     which is unchanged from prior cardiac catheterization.  3. Mildly elevated left ventricular end-diastolic pressure.   RECOMMENDATIONS:  Based on the coronary anatomy continued aggressive medical  therapy is indicated.  This pain is probably of noncardiac etiology.  If she  continues to have persistent pain evaluation for a noncardiac cause for  chest pain may be indicated.   DESCRIPTION OF PROCEDURE:  Under the usual sterile precautions using 6-  French right femoral artery access and a 5-French right femoral venous  access left heart catheterization was performed through the arterial access.  A 6-French multipurpose FL-4 diagnostic catheter was advanced into the  ascending aorta with 0.02 __________.  The left  main coronary artery was  selectively engaged and angiography was performed.   Then the catheter was pulled out the in the usual fashion and the 6-French  JR-4 diagnostic catheter was advanced into ascending aorta in a similar  fashion.  The right coronary artery was selectively engaged and angiography  was performed.  Then the catheter was pulled out of the body in the usual  fashion.  An angled pigtail catheter was advanced into the left ventricle in  a similar fashion.  Left ventricular pressures were monitored and left  ventriculography was performed in the RAO projection.  Then the catheter was  pulled out of the body in the usual fashion.   Right femoral angiography was performed through the arterial access sheath  and because of the proximity of the access to the bifurcation of the  profunda and superficial femoral artery it was deemed to proceed with manual  compression for hemostasis.   The patient tolerated the procedure well.                                               Cristy Hilts. Jacinto Halim, M.D.    Pilar Plate  D:  11/10/2002  T:  11/11/2002  Job:  [id number]   cc:   Clint Bolder, M.D.   Mila Homer. Sudie Bailey, M.D.  [address] [place], [place] [address]  Fax: [phone]

## 2010-05-23 NOTE — Cardiovascular Report (Signed)
[name]. [facility]  Patient:    [name], [name] Visit Number: [id number] MRN: [id number]          Service Type: MED Location: 6500 [id number] Attending Physician:  Pamella Pert Dictated by:   Delrae Rend, M.D. Proc. Date: 05/10/01 Admit Date:  05/09/2001 Discharge Date: 05/11/2001   CC:         Dr. Kizzie Bane, M.D.  [facility], [address] [address]   Cardiac Catheterization  PROCEDURES PERFORMED: 1. Percutaneous transluminal coronary angioplasty of the in-stent restenotic    segment of the left anterior descending artery. 2. Brachytherapy of the in-stent restenosis segment of the left anterior    descending artery using Galileo system.  INDICATIONS:  [name] is a 77 year old gentleman with a history of diabetes, hypercholesterolemia who underwent PTCA of the left anterior descending artery on May 09, 2001, for an in-stent restenosis and was brought back to the cardiac catheterization lab for brachytherapy.  The patient had recoiled his post PTCA site and had 60% mid left anterior descending artery stenosis.  Hence, balloon angioplasty was performed before administration of brachytherapy.  PROCEDURAL DATA:  The left anterior descending artery in-stent restenotic site had a recoil of about 60%.  This was angioplastied with a 2.5 x 20-mm CrossSail balloon at 12 atmospheres of pressure for 90 seconds.  The balloon was deflated and the inflow of the stent was again dilated at 6 atmospheres of pressure for 90 seconds.  The balloon was deflated and the stenosis reduced from 60% to less than 20%.  There was TIMI-3 to TIMI-3 flow at the beginning and end of the procedure.  There was no evidence of dissection.  Brachytherapy was administered using the Galileo 2.5 x 32-mm balloon and the dwell time was 119 seconds.  The patient tolerated the procedure well.  RECOMMENDATIONS: 1. The patient  will be continued on aspirin and Plavix for long-term. 2. Aggressive risk factor modification is again indicated.  TECHNIQUE OF PROCEDURE:  Under usual sterile precautions using #7 French right femoral arterial access, a #7 [place] FL-4.5 guide was advanced over the ascending aorta over a 0.035-mm J wire.  Because of damping of the pressures, the catheter was pulled out of the body and sidehole was done manually and the catheter was readvanced to the ascending aorta over a 0.035-mm J wire.  The left main coronary artery was selectively engaged.  Arteriography was performed.  Then a 190-cm x 0.014-inch BMW wire was advanced to the left anterior descending artery and the tip of the wire was carefully positioned in the distal left anterior descending artery.  Then a 2.5 x 20-mm CrossSail balloon was advanced into the mid left anterior descending artery and after confirming the position of the balloon, balloon angioplasty was performed at 12 atmospheres of pressure for 90 seconds.  The balloon was deflated, pulled back just into the inflow of the stent.  A second inflation was performed at 6 atmospheres of pressure for 60 seconds.  A third angioplasty was again performed in the proximal inflow segment at 4 atmospheres of pressure for 60 seconds.  The balloon was deflated and pulled back into the guiding catheter and arteriography was performed.  After confirming the ______ with reduction of stenosis to less than 20%, the balloon was pulled out of the body.  The [facility] system was utilized for brachytherapy administration.  A 2.5 x 32-mm balloon was advanced over the BMW guidewire  and the catheter was carefully positioned and brachytherapy was administered.  The total dwell time was 119 seconds.  The [facility] catheter was returned out of the body and angiography was performed.  During the procedure, intracoronary nitroglycerin was also administered.  Then the guidewire was pulled out of the  body, angiography performed, and the guide catheter was disengaged from the left main coronary artery and pulled out of the body over a 0.035-mm J wire.  The arterial access site was sutured in place and the patient was transferred to the floor in stable condition.  During the procedure, ACT was maintained at around 250 seconds.  A total of 10,000 units of intravenous heparin was administered.  Integrilin was started in the cardiac catheterization lab just at the termination of the procedure.  The patient tolerated the procedure well.  No complications were noted. Dictated by:   Delrae Rend, M.D. Attending Physician:  Pamella Pert DD:  05/10/01 TD:  05/11/01 Job: [id number]

## 2010-05-23 NOTE — Op Note (Signed)
NAME:  [name], [name]                         ACCOUNT NO.:  [id number]   MEDICAL RECORD NO.:  [id number]                  PATIENT TYPE:  AMB   LOCATION:  DAY                                  FACILITY:  [facility]   PHYSICIAN:  Lionel December, M.D.                 DATE OF BIRTH:  08/06/1932   DATE OF PROCEDURE:  05/08/2002  DATE OF DISCHARGE:                                 OPERATIVE REPORT   PROCEDURE:  Total colonoscopy.   INDICATIONS:  [name] is a 77 year old Caucasian male who was recently  found to have anemia.  However, his anemia profile was normal.  He does have  history of intermittent hematochezia.  He is undergoing diagnostic  colonoscopy.  Procedure was reviewed with the patient and informed consent  was obtained.   PREOPERATIVE MEDICATIONS:  Demerol 25 mg IV, Versed 5 mg IV in divided  doses.   INSTRUMENT:  Olympus video system.   FINDINGS:  The procedure was performed in the endoscopy suite.  The  patient's vital signs and O2 saturation were monitored during the procedure  and remained stable.  The patient was placed in the left lateral recumbent  position.  Rectal examination was performed.  He has small central skin tag.  Digital exam was normal.  Scope was placed in the rectum and advanced into  the region of the sigmoid colon and beyond.  Preparation was excellent.  The  scope was passed to the cecum which was identified by the  appendiceal  orifice and ileocecal valve.  There were three tiny polyps in this area at  blunted cecum which were ablated by cold biopsy and submitted in one  container.  Short segment of terminal ileum was also examined and was  normal.  As the scope was withdrawn, colonic mucosa was once again carefully  examined.  There were no other abnormalities.  Rectal mucosa was normal.  The scope was retroflexed and examined anorectal junction and small  hemorrhoids were noted below the dentate line.  At this point the endoscope  was  withdrawn.  The patient tolerated the procedure well.   FINAL DIAGNOSIS:  1. Three tiny polyps at the cecum ablated with cold biopsy.  2. Small external hemorrhoids that would explain his intermittent     hematochezia.    RECOMMENDATIONS:  Standard instructions given.  I will be contacting the  patient with biopsy results and further recommendations if any.  As far as  his anemia is concerned, I would like for him to follow with Dr. Sudie Bailey in  a few weeks and get his CBC checked.  If anemia is progressive, he will need  further evaluation per hematologist.  Lionel December, M.D.    NR/MEDQ  D:  05/08/2002  T:  05/08/2002  Job:  [id number]   cc:   Mila Homer. Sudie Bailey, M.D.  [address] [place], [place] [address]  Fax: [phone]

## 2010-05-23 NOTE — Discharge Summary (Signed)
[place]. [facility]  Patient:    [name], [name] Visit Number: [id number] MRN: [id number]          Service Type: DSU Location: 6500 [id number] Attending Physician:  Pamella Pert Dictated by:   Delrae Rend, M.D. Admit Date:  05/09/2001   CC:         Dr. Kizzie Bane, M.D.  [facility], [address] [address]   Discharge Summary  PROCEDURES PERFORMED: 1. Left ventriculography. 2. Selective right and left coronary arteriography. 3. Percutaneous transluminal coronary angioplasty of the in-stent restenotic    mid left anterior descending artery lesion with a 2.5 x 10-mm cutting    balloon. 4. Percutaneous transluminal coronary angioplasty with a 2.0 x 15-mm CrossSail    balloon of the diagonal #2, proximal segment.  INDICATIONS:  [name] is a 77 year old gentleman with a history of hypertension, diabetes, who had undergone PTCA and stenting of the left anterior descending artery and right coronary artery in December 2002 who presents with class 3 angina pectoris.  A cardiac catheterization is being performed to evaluate for suspected ______ restenosis.  HEMODYNAMIC DATA: 1. Left ventricular pressure 143/20. 2. Central aortic pressure 143/78 with no pressure gradient across the aortic    valve.  LEFT VENTRICLE:  The left ventriculography revealed well preserved left ventricular systolic function, ejection fraction 60%.  ANGIOGRAPHIC DATA: 1. Right coronary artery:  The right coronary artery is a dominant vessel.  It    is a large caliber vessel.  The previously placed mid right coronary artery    stent (December 23, 1998, 4.0 x 13-mm Zeta) is widely patent with mild    intimal neoplasia constituting 20% restenosis.  2. Left main coronary:  The left main coronary artery is a large caliber    vessel.  It gives origin to a large caliber circumflex and a larger left    anterior descending  artery.  3. Circumflex coronary artery:  The circumflex coronary artery is a large    caliber vessel.  It has a mid 20% stenosis and a mid to distal 20%    stenosis which is not significantly changed from prior cardiac    catheterization in December 2002.  The circumflex gives origin to a    moderate sized obtuse marginal #1 and continues in the AV groove.  4. Left anterior descending artery:  The left anterior descending artery is    a large caliber vessel.  It gives a very high diagonal #1 artery which has    mild disease.  The left anterior descending artery has mid stent in-stent    restenosis constituting 99% stenosis.  It gives origin to a moderate sized    diagonal #2 which has a proximal 90% stenosis which is not significantly    changed from prior cardiac catheterization as of December 20, 2000;    however, the ostial disease appears to have been progressed.  INTERVENTIONAL DATA:  Successful balloon angioplasty of the mid left anterior descending artery.  The mid left anterior descending artery was predilated with a 2.0 x 15-mm CrossSail balloon and then multiple cuts were made with a 2.5 x 10-mm cutting balloon at 6 atmospheres of pressure, each lasting for 1 minute.  The stenosis was reduced from 99% to less than 30%.  The TIMI flow was from TIMI-3 to TIMI-3 flow with no evidence of dissection, no evidence of thrombus at the end of the procedure.  Successful ______ balloon angioplasty of the diagonal #2 with a 2.0 x 20-mm CrossSail balloon at 5 atmospheres of pressure for 90 seconds x3.  The stenosis was reduced from 90% to less than 20%.  There was TIMI-3 to TIMI-3 flow established.  No evidence of dissection, no evidence of thrombus.  IMPRESSION: 1. Well preserved left ventricular systolic function, ejection fraction 60%. 2. Mild intimal hyperplasia constituting 20% in-stent restenosis of the right    coronary artery. 3. High-grade in-stent restenosis of the mid left  anterior descending artery    constituting 99%, status post balloon angioplasty, to less than 30% with a    2.5 x 10-mm cutting balloon. 4. Successful percutaneous transluminal coronary angioplasty of the diagonal    #2, proximal segment, with a 2.0 x 15-mm CrossSail balloon with reduction    in stenosis from 90% to less than 20%.  RECOMMENDATIONS: 1. Patient will be scheduled for brachytherapy of the mid left anterior    descending artery.  He has a high chance of restenosis as he still has    intimal hyperplasia and the balloon angioplasty results are still    suboptimal. 2. Continued aggressive risk factor modification is indicated.  TECHNIQUE OF DIAGNOSTIC CARDIAC CATHETERIZATION:  Under usual sterile precautions using #6 French right femoral arterial access, a #6 [place] multipurpose B-2 catheter was advanced in the ascending aorta over the 0.035-mm J wire.  The catheter was gently advanced to the left ventricle. Left ventricular pressures were monitored.  Hand contrast injection of the left ventricle was performed in the RAO projection.  The catheter was flushed with saline and pulled back into the ascending aorta.  Pressure gradient across the aortic valve was monitored.  The right coronary artery was selective engaged and arteriography was performed.  In a similar fashion, the left main coronary artery was selective engaged and arteriography was performed.  Then the catheter was pulled out of the body over the 0.035-mm J wire.  TECHNIQUE OF CORONARY INTERVENTION:  The #6 French right femoral arterial access sheath was exchanged for an #8 [place] sheath.  A #5 French venous line was also accessed at the beginning of the diagnostic cardiac catheterization. An #8 [place] FL-4 guide was advanced into the ascending aorta over a 0.035-mm J wire.  The left main coronary artery was selectively engaged.  Because of  damping, the catheter was pulled out of the body and a manual sidehole  was made into the guide.  Then the guide was readvanced into the ascending aorta over the 0.035-mm J wire and the left main coronary artery was selectively engaged.  Then a 190-cm x 0.014-inch luge was advanced into the left anterior descending artery and the tip of the wire was carefully positioned in the distal left anterior descending artery.  Then a 190-cm x 0.014-inch BMW wire was advanced into the left anterior descending artery and the diagonal #2 was selectively cannulated and the tip of the wire was carefully positioned in the distal diagonal #2 vessel.  Then a cutting balloon was advanced into the left anterior descending artery; however, because of inability to cross the lesion, a 2.0 x 15-mm CrossSail balloon was advanced in the left anterior descending artery and balloon dilatation was performed.  Because of residual stenosis, the 2.5 x 10-mm cutting balloon was readvanced into the left anterior descending artery and multiple cuts were made at 60 seconds each.  Then the balloon was returned into the guiding catheter and arteriography was performed.  Then a  2.0 x 15-mm previously used CrossSail balloon was advanced over the BMW wire into the diagonal #2 vessel and after confirming the position of the balloon, balloon angioplasty was performed for 90 seconds at 4 atmospheres of pressure.  Two inflations were performed at 90 seconds.  Then the balloon was deflated, pulled back slightly into the left anterior descending artery and the ostium to diagonal #2 was again dilated at 5 atmospheres of pressure.  There was about 20% residual stenosis left in the diagonal #2; however, because of calcification and because of increased chance of restenosis with the T stenting, the lesion was left alone.  During the procedure, intracoronary nitroglycerin was administered.  During the procedure, intravenous heparin was administered.  ACT was maintained at a therapeutic range and intravenous  Integrilin was also administered.  The arterial access and the venous sheaths were sutured in place.  The patient was transferred to the recovery room in stable condition. Dictated by:   Delrae Rend, M.D. Attending Physician:  Pamella Pert DD:  05/09/01 TD:  05/10/01 Job: [id number]

## 2010-05-23 NOTE — Cardiovascular Report (Signed)
[place]. [facility]  Patient:    [name], [name] Visit Number: [id number] MRN: [id number]          Service Type: MED Location: 3700 [id number] Attending Physician:  Pamella Pert Dictated by:   Delrae Rend, M.D. Proc. Date: 12/22/00 Admit Date:  12/17/2000                          Cardiac Catheterization  PROCEDURE PERFORMED: Percutaneous transluminal coronary angioplasty and stenting of the mid right coronary artery.  INDICATIONS: The patient is a 77 year old gentleman with a history of hypertension, diabetes, hypercholesterolemia, was admitted to the hospital with unstable angina. He underwent PTCA and stenting of the left anterior descending artery on December 20, 2000, and he was brought back to the cardiac catheterization lab for a staged ______, high-grade stenosis of the right coronary artery.  INTERVENTIONAL DATA: Successful PTCA with a 3.5 x 10 mm Cutting Balloon. Atherectomy was performed x3 at maximum of 8 atmospheric pressures for one minute inflations each.  There was haziness noted in the mid segment.  Successful PTCA and stenting of the mid right coronary artery with a 4.0 x 13 mm Zeta stent deployed at 16 atmospheric pressures (4.25 mm lumen). The stenosis was reduced from 80-85% to 0%. The TIMI flow was from TIMI-3 to TIMI-3 at the end of the procedure. There was no evidence of dissection, no evidence of thrombus at the end of the procedure.  TECHNIQUE OF THE PROCEDURE: Under usual sterile precautions, using 8 French left femoral arterial access, a 8 [place] AR1 guide was advanced in the ascending aorta over the 0.025 mm J wire.  The right coronary artery was selectively engaged. Because of damping, the catheter was withdrawn out of the body and side holes were done with the help of a needle and the catheter was reintroduced into the ascending aorta over the 0.025 mm J wire. The right coronary artery was selectively engaged.  Again, dampening was noted. Hence, the catheter was pulled out of the body and exchanged to a 8 [place] hockey-stick with side-hole catheter. The right coronary artery was selectively engaged in the usual fashion. Then a 180 cm x 0.014 mm luge guide wire was advanced into the right coronary artery. The tip of the wire was carefully positioned in the distal right coronary artery.  Then a 3.5 x 10 mm Cutting Balloon was advanced over this guide wire and the Cutting Balloon was positioned in the mid right coronary artery.  After confirming the position of the balloon multiple atherectomy were performed initially at 6 atmospheric pressures then a maximum of 8 atmospheric pressures x3 for one minute inflations each. Then the balloon was ______ with a guiding catheter. Intracoronary nitroglycerin was administered and angiography was performed. There was haziness noted in the mid right coronary artery. Hence, the Cutting Balloon was withdrawn out of the body and a 4.0 x 13 mm Zeta stent was advanced over the luge guide wire and after confirming the position of the stent, the stent was deployed initially at 14 atmospheric pressures for one minute. Then the balloon was deflated and a second inflation was performed at a maximum of 16 atmospheric pressures for 30 seconds. The balloon was deflated, pulled back into the guiding catheter and intracoronary nitroglycerin was administered. Angiography was performed. There were excellent results noted. Then the guide was disengaged from the right coronary artery and the guide wire was removed out of the  body. The guide catheter was eventually removed out of the body over the 0.025 mm J wire. The arterial access sheath was sutured in place and the patient was transferred to the ward in a stable condition.  During the procedure, intravenous heparin and intravenous Integrilin was also administered and the ACT was monitored closely. Dictated by:   Delrae Rend, M.D. Attending Physician:  Pamella Pert DD:  12/22/00 TD:  12/23/00 Job: [id number]

## 2010-05-23 NOTE — Discharge Summary (Signed)
**Note Phillip-Identified via Obfuscation** NAME:  DE[place], [name]                         ACCOUNT NO.:  [id number]   MEDICAL RECORD NO.:  [id number]                   PATIENT TYPE:  OIB   LOCATION:  6533                                 FACILITY:  [facility]   PHYSICIAN:  Kem Boroughs, M.D.                 DATE OF BIRTH:  08/06/1932   DATE OF ADMISSION:  06/29/2002  DATE OF DISCHARGE:  06/30/2002                                 DISCHARGE SUMMARY   ADMISSION DIAGNOSES:  1. Unstable angina.  2. Status post Cardiolite scan on June 13, 2002, that revealed lateral     ischemia, ejection fraction 59%.  3. Known coronary artery disease.     a. History of percutaneous coronary intervention to the left anterior        descending and right coronary artery in December 2002.     b. Stent restenosis of left anterior descending prompting percutaneous        coronary intervention and brachytherapy.  4. History of deep venous thrombosis in the left lower extremity.  5. Peripheral vascular disease with peroneal disease bilaterally.  This is     per angiogram May 2003.   DISCHARGE DIAGNOSES:  1. Unstable angina.  2. Status post Cardiolite scan on June 13, 2002, that revealed lateral     ischemia, ejection fraction 59%.  3. Known coronary artery disease.     a. History of percutaneous coronary intervention to the left anterior        descending and right coronary artery in December 2002.     b. Stent restenosis of left anterior descending prompting percutaneous        coronary intervention and brachytherapy.  4. History of deep venous thrombosis in the left lower extremity.  5. Peripheral vascular disease with peroneal disease bilaterally.  This is     per angiogram May 2003.  6. Status post cardiac catheterization by Dr. Yates Decamp on June 29, 2002,     revealing 99% instant restenosis and a previously placed stent to the     proximal left anterior descending while there was severe diseased 50-60%     stenosis just proximal to this.  There is  also 80% stenosis of an ostial     diagonal.  Dr. Jacinto Halim did try to proceed with cutting balloon angioplasty     of the 99% restenosis site.  It was felt that if the patient has     recurrent restenosis, we might need to consider bypass surgery.  At this     time, we will continue medical therapy and try to optimize __________     modification.   HISTORY OF PRESENT ILLNESS:  [name] is a 77 year old white male who  has history of CAD with previous PCI to the LAD and RCA with stent placement  and in December of 2002.  He had subsequent stent restenosis in the LAD  prompting PCI with  brachytherapy.  As well he had a DVT in the left lower  extremity, status post his first catheterization.  He has also had  peripheral angiography in May 2003 that showed mild distal peroneal  bilateral disease.   From his last office visit earlier in last June, he reported left-sided  chest discomfort with exhaustion which was briefer in duration than his  prior angina but somewhat different and the fact that it was somewhat sharp  and did not occur with exertion.  The duration had been a minute or two.  It  was somewhat different than his previous angina.  He was sent for repeat  nuclear test.  This was performed on June 13, 2002.  This revealed lateral  ischemia at the apex.  Ejection fraction 59%.  He presented to the office  for follow up of his tests on June 21 and was informed of the findings.  It  was felt that we needed to proceed with cardiac catheterization.  Risks and  benefits of the procedure were discussed with the patient who was willing to  proceed.   HOSPITAL COURSE:  On June 29, 2002, [name] underwent cardiac  catheterization by Dr. Yates Decamp.  He was found to have 20-30% stent  restenosis of the RCA previously placed stent.  In the proximal LAD, the  previously placed stent had a 99% stent restenosis.  Just proximal to this  previously placed stent, there was some 50-60% stenosis  of the native LAD.  There was some 80% ostial disease in the ostial diagonal branch.  Dr. Jacinto Halim  performed cutting balloon angioplasty to the stent restenosis and got this  to less than 30% residual.  We plan to continue medical therapy for the  residual disease.  It is felt that if he has recurrent restenosis, we would  have to consider bypass.  The patient tolerated the procedure well with no  complications.   On June 30, 2002, [name] is stable with no chest pain, no shortness of  breath.  His labs are stable post procedure.  He is hemodynamically stable  with blood pressure 104/60, heart rate 62.  Wound site is well healed  without hematoma or bleed.  At this point, he is staying hemodynamically  stable after 24 hours and was therefore discharged home.  Prior to discharge  home, he will have a nutrition consult and diabetic teaching and followup  arrangements made.  He will also ambulate in the hallway prior to discharge.   HOSPITAL CONSULTS:  None other than diabetic teaching and nutrition.   HOSPITAL PROCEDURES:  Cardiac catheterization on June 29, 2002, by Dr. Yates Decamp.  This revealed 20-30% stent restenosis of the prior RCA stent.  Left  circumflex and its branches were free of disease.  In the LAD, the  previously placed proximal LAD stent had 99% stent restenosis.  Proximal to  this area was some 50-60% stenosis of the LAD.  The diagonal two branches  had some ostial 80% disease.  Dr. Jacinto Halim proceeded with cutting balloon  angioplasty to the stent restenosis of the LAD and this went to about 30%  residual.  We plan to continue medical therapy for the remainder of his  disease.  Inasmuch as he had repeat instant restenosis, we will need to  consider bypass surgery.   LABORATORY DATA:  Pre-procedure labs were drawn as an outpatient.  Post-  procedure labs on June 30, 2002, show white count 6.2, hemoglobin 10.8, hematocrit  31, platelets 175.  Sodium 138, potassium 4, BUN  14, creatinine  0.8.   DISCHARGE MEDICATIONS:  1. Lipitor 80 mg once a day.  2. Aspirin 81 mg once a day.  3. Glipizide 20 mg once a day.  4. Hydrochlorothiazide 25 mg once a day.  5. Furosemide 40 mg once a day.  6. Plavix 75 mg once a day.  7. Avandia 8 mg once a day.  8. Altace 10 mg once a day.  9. Toprol-XL 25 mg once a day.  10.      Viagra as directed.  11.      Nitroglycerin as directed.   No strenuous activity, lifting greater than five pounds, driving or sexual  activity for three days.   Low salt, low fat, low cholesterol diabetic diet.   Wash the groin site with warm water and soap.   Call [phone] if any bleeding or injury to the groin site.   He has an appointment to see Dr. Domingo Sep back in the [place] office July  13 at 2:15.   Note that he has received diabetic teaching and a diabetic kit for followup  and has also had a nutrition consult prior to his discharge home.     Mary B. Easley, P.A.-C.                   Kem Boroughs, M.D.    MBE/MEDQ  D:  06/30/2002  T:  07/01/2002  Job:  [id number]   cc:   Kem Boroughs, M.D.  [phone] [address]  [place]  [place] [address]  Fax: [phone]   Mila Homer. Sudie Bailey, M.D.  [address] [place], [place] [address]  Fax: [phone]    cc:   Kem Boroughs, M.D.  [phone] [address]  [place]  [place] [address]  Fax: [phone]   Mila Homer. Sudie Bailey, M.D.  [address] [place], [place] [address]  Fax: [phone]

## 2010-05-23 NOTE — Cardiovascular Report (Signed)
[place]. [facility]  Patient:    [name], [name] Visit Number: [id number] MRN: [id number]          Service Type: DSU Location: 6500 [id number] Attending Physician:  Pamella Pert Dictated by:   Delrae Rend, M.D. Proc. Date: 05/09/01 Admit Date:  05/09/2001   CC:         Dr. Kizzie Bane, M.D.  [facility], [address] [address]   Cardiac Catheterization  PROCEDURE PERFORMED: 1. Abdominal aortogram. 2. Selective right and left iliac angiogram with femoral runoff.  INDICATIONS:  [name] is a 77 year old gentleman with a history of hypertension, diabetes, hypercholesterolemia who has been complaining of leg pain suggestive of claudication especially in the left lower extremity; hence, an angiography is being performed to evaluate for evidence of peripheral vascular disease.  TECHNIQUE:  The patient has undergone cardiac catheterization this morning. He was transferred to the [facility] lab with sheaths in place.  The #8 French right femoral arterial access sheath was exchanged under sterile conditions for a new #8 [place] sheath.  Then a #5 [place] diagnostic LIMA catheter was advanced into the abdominal aorta over the 0.035-mm J wire.  The abdominal aortogram was performed with hand contrast injections.  Then the left iliac artery was selectively engaged and angiography was performed.  The superficial femoral artery and the distal bed of the left lower extremity was visualized by selectively cannulating the catheter deep into the superficial femoral artery into the [place] canal.  With the help of the 0.035-mm J wire, the diagnostic LIMA catheter was advanced into the distal superficial femoral artery. Angiography was again performed with hand contrast injection.  Then after obtaining an adequate view, the catheter was pulled back into the right iliac artery ostium and right iliac angiogram with distal  runoff was performed. Then the catheter was pulled out of the body over the 0.035-mm J wire.  The arterial access sheath was pulled.  Manual pressure was held.  Adequate hemostasis was obtained.  The patient was transferred to the recovery room in stable condition.  ANGIOGRAPHIC DATA: 1. Abdominal angiogram revealed widely patent renal arteries and mesenteric    and celiac trunk with no significant stenosis noted in the renal arteries. 2. The right and left iliac angiogram revealed widely patent iliac arteries    with widely patent superficial femoral arteries.  There was also good    runoff ______ noted in the anterior tibial and perineal trunks    bilaterally.  There was mild insignificant disease noted in the perineal    trunk bilaterally.  RECOMMENDATIONS:  The patient has mild distal disease in the perineal trunk but continued medical therapy and risk factor modification is indicated.Dictated by:   Delrae Rend, M.D. Attending Physician:  Pamella Pert DD:  05/09/01 TD:  05/10/01 Job: [id number]

## 2010-05-23 NOTE — Discharge Summary (Signed)
. [facility]  Patient:    [name], [name] Visit Number: [id number] MRN: [id number]          Service Type: MED Location: [phone] Attending Physician:  Pamella Pert Dictated by:   Polo Riley Benjamine Mola, M.D. Admit Date:  05/09/2001 Discharge Date: 05/11/2001   CC:         John Giovanni, M.D.   Discharge Summary  HISTORY OF PRESENT ILLNESS:  [name] is a 77 year old white married male patient of Dr. Romeo Apple who has a medical history of coronary artery disease.  He most recently had a catheterization on December 20, 2000, and was found to have high-grade lesions.  He had stenting of his LAD, and then he had a staged intervention of his RCA with atherectomy and stenting. Several weeks after intervention, he developed a DVT and has been on Coumadin since then.  He came into the hospital because he was experiencing recurrent anginal symptoms.  He was taken off Coumadin in preparation.  He also had claudication symptoms, and plans for abdominal aortography with runoff were being made.  HOSPITAL COURSE:  On May 09, 2001, he underwent catheterization by Dr. Jacinto Halim. The RCA stent had 20% in-stent restenosis.  The LAD stent had in-stent 99% resetnosis.  He underwent cutting balloon and brachytherapy of that lesion. He also had a 90% diagonal-2 for which he underwent PTCA, reduced to less than 20%.  He then underwent peripheral angiogram which showed widely patent renal, iliac, and superficial femoral arteries.  He had excellent runoff.  He did have small vessel disease wit mild bilateral peroneal disease.  He actually had the brachytherapy on May 10, 2001.  On May 11, 2001, he was seen by Dr. Jacinto Halim and considered stable to be discharged home.  His blood pressure was 124/62, heart rate 64, temperature 97.3.  It was felt that his diabetes was not well controlled; however, a review of his medications showed that he had previously been  on Avandia which he had not told us about previously.  This was added at his discharge.  He did receive a glucometer to follow his blood sugars at home, and he will follow up with his primary care physician, Dr. Sudie Bailey, for followup of diabetes.  He will also received some diabetes education, and he was offered the nutritional classes at [facility].  We are not sure at this point whether or not he will attend these; because of his work schedule, it may be difficulty.  He was also offered cardiac rehabilitation, and he may not be able to attend this because of his work schedule.  Labs on May 11, 2001, showed hemoglobin 11.7, hematocrit 34.3, platelets 204, WBC 7.6.  Sodium 138, potassium 3.8, glucose 217, BUN 12, creatinine 0.8.  There is no chest x-ray on the chart at the time of this dictation.  DISCHARGE MEDICATIONS: 1. Glucotrol 10 mg twice per day. 2. Avandia 8 mg once per day. 3. Toprol XL 50 mg once per day., 4. Lipitor 40 mg once per day. 5. Foltx one every day. 6. Enteric-coated aspirin 81 mg every day. 7. Hydrochlorothiazide 25 mg once per day. 8. Plavix 75 mg once per day. 9. Altace 10 mg once per day.  He should stop taking Lasix and stop taking Coumadin.  ACTIVITY:  He should not do any strenuous activity or lifting for a week.  He should do no driving for three days.  SPECIAL INSTRUCTIONS:  If he has  any problems with his leg, he will call our office.  He should have his blood drawn a few days before he sees Dr. Domingo Sep.  We are drawing a BMET to evaluate his electrolytes after being started on hydrochlorothiazide.  He will follow up with Dr. Sudie Bailey about diabetes.  I have asked him to check his blood sugars once a day at different times of the day such as before breakfast, two hours after a meal, before lunch, before supper, and at bedtime.  DISCHARGE DIAGNOSES: 1. Unstable angina. 2. Status post cardiac catheterization with restenosis in his left  anterior    descending artery stent status post percutaneous transluminal coronary    angioplasty and then brachytherapy with residual diagonal #2, high-grade    90%.  With undergoing percutaneous transluminal coronary angioplasty,    reduced to 20%. His right coronary artery stent was open, and he had    minimal blockage in his circumflex. 3. Claudication symptoms.  He does have some mild small vessel disease in    his peroneal arteries bilaterally; however his right and left iliac and    superficial femoral artery are widely patent.  Also, his renal arteries    are patent. 4. Hyperlipidemia. 5. History of deep venous thrombosis, now off Coumadin.  This occurred    status post catheterization. 6. Adult onset diabetes mellitus, type 2. 7. Erectile dysfunction. 8. Hypertension. Dictated by:   Polo Riley Benjamine Mola, M.D. Attending Physician:  Pamella Pert DD:  05/11/01 TD:  05/14/01 Job: [id number]

## 2010-05-23 NOTE — Cardiovascular Report (Signed)
NAME:  [name], [name]                         ACCOUNT NO.:  [id number]   MEDICAL RECORD NO.:  [id number]                   PATIENT TYPE:  OIB   LOCATION:  6533                                 FACILITY:  [facility]   PHYSICIAN:  Vonna Kotyk R. Jacinto Halim, M.D.                  DATE OF BIRTH:  08/06/1932   DATE OF PROCEDURE:  06/29/2002  DATE OF DISCHARGE:  06/30/2002                              CARDIAC CATHETERIZATION   PROCEDURES PERFORMED:  1. Left ventriculography.  2. Selective left and right coronary arteriography.  3. Cutting balloon angioplasty of the proximal left anterior descending and     in-stent restented segment of the mid left anterior descending; in-stent     restenotic segment of the mid left anterior descending occurred.  4. Intracoronary nitroglycerin administration.   CARDIOLOGIST:  Cristy Hilts. Jacinto Halim, M.D.   INDICATIONS:  [name] is a 77 year old gentleman with history of morbid  obesity, hypertension and hyperlipidemia who has had PTCA and stenting of  the RCA on December 22, 2000 as well as PTCA and stenting of the LAD on  December 20, 2000 status post brachytherapy to the in-stent restenotic LAD  that was performed on May 10, 2001.  He presented with increasing shortness  of breath and chest pain.  He underwent a Cardiolite stress test, which  revealed anteroapical and apical lateral wall ischemia.  Given this he was  brought to the cardiac catheterization lab to evaluate his coronary anatomy.   HEMODYNAMIC DATA:  The left ventricular pressures were 132/13 with an end-  diastolic pressure of 24 mmHg.  The aortic pressures were 132/72 with mean  if 98 mmHg.  There was no pressure gradient across the aortic valve.   ANGIOGRAPHIC DATA:  Left Ventricle:  The left ventricular systolic function  was normal and the ejection fraction was estimated at 65%.  There was no  significant mitral regurgitation.   Right Coronary Artery:  The right coronary artery is a super dominant  vessel  and gives origin to a large PLV, which supplies the apical lateral part of  the left ventricle, and a moderate-to-large-sized PDA.  The previously  placed stent in the mid segment of the RCA (4.0 x 13 Zeta) has 20-30%  luminal irregularity, which is unchanged from cardiac catheterization  performed on May 10, 2001.   Left Main Coronary Artery:  The left main coronary artery is very short and  severely diffusely diseased, small caliber vessel.   Circumflex:  The circumflex coronary artery is a moderate-to-large caliber  vessel.  It has mild luminal irregularity.  It perfuses the obtuse marginal-  1.   Ramus Intermediate:  The ramus intermediate is a small vessel.   Left Anterior Descending Artery:  The left anterior descending artery is a  very small caliber vessel.  It ends at the apex just before reaching the  apex.  It is severely diseased in  the proximal segment.  It gives origin to  a small diagonal-1, but moderate-sized diagonal-2.  This diagonal-2 is the  larger of the diagonals and has an ostial 80-90% stenosis.  It has severe  diffuse disease.  It supplies a large part of the lateral wall.  The LAD  itself has in-stent 99% stenosis in its proximal segment of the previously  placed mid LAD stent that was placed on December 20, 2000 (2.5 x 20 mm  Express).  The proximal LAD also has severe diffuse disease and is at most  2.5 mm in diameter.  The diagonal-1 is small and the diagonal-2 measures  about 1.5 to at most 2.0 mm, if any.   IMPRESSION:  1. Normal left ventricular systolic function with an ejection fraction 65%.  2. Super dominant right coronary artery.  The stent in the right coronary     artery is widely patent with 20-30% luminal irregularity within the stent     that was placed on December 22, 2000.  3. The circumflex has mild disease.  4. The left anterior descending is severely diseased in the proximal and mid     segments.  It is a small caliber vessel  and has a 99% in-stent restenosis     in its proximal segment of the previously placed stent (2.5 x 20 Express     placed on December 20, 2000), this is in spite of the brachytherapy that     patient received previously.  There is also 60-70% diffuse luminal     stenosis of the proximal left anterior descending.  The diagonal-2 has     ostial 80-90% stenosis.  This is unchanged from prior cardiac     catheterization.   INTERVENTIONAL DATA:  Successful cutting balloon angioplasty of the in-stent  restenosis of the ostium of the proximal LAD with a 2.5 x 6 mm cutting  balloon.  The overall stenosis was reduced from, within the stent, 99% to  less than 20% and in the proximal inflow segment of the LAD towards the  stent from 70% to less than 30%.   RECOMMENDATIONS:  The LAD disease is severe and the LAD itself is of small  caliber with severe diffuse disease.  If he has recurrent chest pain the  plan will be to perform OPCAB for the LAD with ___________ stenosis.  I have  discussed these issues with the patient and his wife.  Otherwise we will  continue medical therapy and hopefully there will be no restenosis.   TECHNIQUE OF THE PROCEDURE:  Diagnostic cardiac catheterization under the  usual sterile precautions using a 6 French right femoral arterial access and  a 6 [place] multipurpose B2 catheter was advanced into the ascending aorta  with a 0.035 inch __________.  The catheter was gently advanced into the  left ventricle.  Left ventricular pressure was monitored.  Hand contrast  injections in the left ventricle was done in both the LAO and RAO  projections.  The catheter was flushed and pulled back into the ascending  aorta, and pressure gradient across the aortic valve was monitored.  The  right coronary artery was selectively engaged and angiography was performed.  In a similar fashion the left main coronary artery was selectively engaged and angiography was performed.  Then the  catheter as pulled out of the body  in the usual fashion.   TECHNIQUE OF INTERVENTION:  Initially a 7 French 4.5 guide was utilized to  engage the left main  coronary artery.  Because of damping this catheter was  pulled out and side hole was created, and reattempt was performed.  Because  of severe damping again the catheter was exchanged to a 7 [place] XL 3.0 with  side hole.  With this I was able to adequately engage the left main coronary  artery.  Then a 180 cm x 0.014 inch Luge wire was utilized to cross the LAD  and the tip of the wire was carefully positioned in the distal LAD.  Intracoronary 200 mg nitroglycerin was administered and angiography  repeated.  Then a 2.25 x 6 mm cutting balloon was utilized to perform  multiple angioplasties in the in-stent restenotic mid LAD stent and also the  proximal inflow of the stent.  These inflations were performed from four  atmospheric all the way up to 10 atmospheric pressure from 15 seconds all  the way up to 62 seconds.  Intracoronary nitroglycerin was again  administered, and angiography was repeated.   Then the guide was withdrawn.  Repeat angiography performed.  Catheter was  disengaged and pulled out of the body in the usual fashion.   The patient tolerated the procedure well.                                                 Cristy Hilts. Jacinto Halim, M.D.    Pilar Plate  D:  06/29/2002  T:  07/01/2002  Job:  [id number]  Mila Homer. Sudie Bailey, M.D.  [address] [place], [place] [address]  Fax: [phone]   Kem Boroughs, M.D.  [phone] [address]  [place]  [place] [address]  Fax: [phone]   cc:   Mila Homer. Sudie Bailey, M.D.  [address] [place], [place] [address]  Fax: [phone]   Kem Boroughs, M.D.  [phone] [address]  [place]  [place] [address]  Fax: [phone]

## 2010-05-23 NOTE — Discharge Summary (Signed)
[place]  Patient:    [name], [name] Visit Number: [id number] MRN: [id number]          Service Type: MED Location: [phone] Attending Physician:  Pamella Pert Dictated by:   Halford Decamp Delanna Ahmadi, R.N., N.P. Admit Date:  12/17/2000 Discharge Date: 12/23/2000   CC:         Dr. Kemper Durie   Discharge Summary  HISTORY OF PRESENT ILLNESS: The patient is a 77 year old white male with a prior medical history of hypertension, diabetes, hyperlipidemia.  He had a two-and-one-half week history of chest pain that is sternal with shortness of breath, no other associated symptoms which occurred with exertion, resolved with rest.  The duration was several minutes.  He had a nuclear scan apparently as an outpatient and was positive with chest pain.  His electrocardiogram showed ischemia, 1 to 2 mm downsloping, ST-down, V5 to V6 inferior, worse during recovery.  He required nitroglycerin for chest pain relief.  Thus, it was decided the patient needed to be admitted and undergo cardiac catheterization.  HOSPITAL COURSE:  The patient underwent cardiac catheterization on 12/20/00 by Dr. Jacinto Halim and this revealed positive LAD lesion, high grade.  Diffuse disease with small caliber vessel.  He also had an 80% stenosis of his mid right. He also had 90% ostial diagonal 2 lesion.  He underwent stenting of his LAD with plans for stage intervention of his right coronary artery in two days.  Post procedure the patient did well, however, on admission his hemoglobin was 10.4. Post procedure it was down to 9.9. Iron studies were done.  He was negative Guaiac. The patient then went on to have re-catheterization and had a cutting balloon procedure to his right coronary artery and then stenting.  On 12/23/00 he was not having any further chest pain. His electrocardiogram was without any ischemia post procedure. He has been started on metoprolol while in the hospital,  Protonix, Plavix and his Altace was increased.  His blood pressure on the day of discharge was 140/74, heart rate 68, temperature 97.8, SAO2 98%. His telemetry showed sinus bradycardia to sinus rhythm, 59 to 67.  He was seen by Dr. Susa Griffins who recommended that he have a gastrointestinal evaluation as an outpatient.  He will probably need a colonoscopy secondary to his unexplained anemia.  Prior to his discharge we spoke with his primary cardiologist, Dr. Laurena Slimmer.  He had initially been started on Niaspan as an outpatient, however, it was decided that because his hemoglobin A1C was 7.5 and his non-HDL cholesterol is 166 that we would discontinue the Niaspan and start Zetia 10 mg once per day to drive down his LDL/non-HDL cholesterol.  It was felt that the Niaspan may worsen his diabetes since it does not appear to be well controlled at this time.  Dr. Laurena Slimmer plans to follow up with the patient as an outpatient and talk to his primary care physician, Dr. Metta Clines. The patient may need some nutritional diabetic classes as an outpatient for better control of his diabetes.  LABORATORY DATA: The day of discharge his hemoglobin was 9.9, hematocrit was 28.7, white blood cell count 7.0, platelet count 221K.  His hemoglobin on admission was 10.4, hematocrit 30.8.  ELECTROLYTES:  Sodium 138, potassium 4.0, glucose 204, BUN 12, creatinine 0.7. CK-MBs were negative times three.  Troponin levels were negative times three.  His total iron was 43, TIBC 360, percent saturation was 12, vitamin B12 408, his serum folate  was greater than 20.  LIPIDS:  As an outpatient his total cholesterol on 11/24/00 was 201, triglycerides 310, HDL was 35, LDL was 104, however, this is probably not accurate with an HDL of 35 and triglycerides of 310.  His non-HDL cholesterol is 166.  DISCHARGE MEDICATIONS: 1. Glucotrol 10 mg before breakfast. 2. Enteric coated aspirin 325 mg q.d. 3. Altace 10 mg once per  day. 4. Plavix 75 mg once per day times four weeks. 5. Protonix 40 mg once per day. 6. Metoprolol 50 mg twice per day. 7. Lipitor 40 mg once per day. 8. Zetia 10 mg once per day. 9. Nitroglycerin 1/150 one sublingual q.5 minutes times three for chest pain.  ACTIVITY:  Patient should do no strenuous activity, no driving or lifting times three days. He should be on a low saturation fat diet, less than 12 grams of saturated fat per day and diabetic diet.  WOUND CARE:  If the patient has any problems with his groin he will call our office.  He will need to have an evaluation by a gastroenterologist.  He may need referral to the nutritional diabetic classes at [facility] through [facility].  FOLLOW UP:  He will follow up with Dr. Laurena Slimmer as an outpatient in a few weeks.  DISCHARGE DIAGNOSES: 1. Unstable angina. 2. Arteriosclerotic cardiovascular disease status post catheterization with    high grade mid left anterior descending, high grade diagonal 2 ostial    lesion and an 80% mid right coronary artery. 3. Status post intervention with stenting to left anterior descending and    atherectomy, stent to his right coronary artery. 4. Ejection fraction 60%. 5. Hypertension, medications titrated for blood pressure in the hospital. 6. Hyperlipidemia with change in his medications at discharge. 7. Onset diabetes mellitus type 2, not on insulin and not well controlled. 7. Anemia, unexplained cause with negative Guaiac stools in the hospital and    iron is borderline on the low side. Dictated by:   Halford Decamp Delanna Ahmadi, R.N., N.P. Attending Physician:  Pamella Pert DD:  12/23/00 TD:  12/24/00 Job: [id number]

## 2010-05-23 NOTE — Cardiovascular Report (Signed)
[place]. [facility]  Patient:    [name], [name] Visit Number: [id number] MRN: [id number]          Service Type: MED Location: [id number] Attending Physician:  Pamella Pert Dictated by:   Delrae Rend, M.D. Proc. Date: 12/20/00 Admit Date:  12/17/2000   CC:         Kem Boroughs, M.D., [place], [place].             [facility] [address], [place]                        Cardiac Catheterization  REFERRING CARDIOLOGIST:  Kem Boroughs, M.D.  PROCEDURE PERFORMED: 1. Selective left ventriculography. 2. Selective right and left coronary arteriography. 3. Percutaneous transluminal coronary angioplasty and stenting of the mid left    anterior descending artery. 4. Closure of the right femoral arterial access with Perclose device.  INDICATIONS:  [name] is a 77 year old gentleman with a past medical history of hypertension, hypercholesterolemia, diabetes who was admitted to the hospital with chest pain suggestive of unstable angina.  He was brought to the cardiac catheterization lab to evaluate his coronary anatomy.  HEMODYNAMIC DATA:  The left ventricular pressure was 138/10 with an end diastolic pressure of 21 mmHg.  The central aortic pressures were 141/70 with a mean of 96 mmHg.  There was no pressure gradient across the left ventricle to the aorta across the aortic valve.  ANGIOGRAPHIC DATA:  Left ventriculography:  Left ventriculogram performed in both RAO and LAO projections revealed ejection fraction of about 60%.  There was no significant mitral regurgitation.  Coronary arteriography: 1. Right coronary artery:  This is a caliber vessel.  It is at least 3.5 mm    lumen size.  In the mid segment, it has about 80% shaggy  lesion which is    very focal.  The right coronary artery bifurcated into a PDA and PLV    branches.  The remaining of the vessel is smooth. 2. Left main coronary artery:  The left main  coronary artery is a moderate    caliber vessel.  It bifurcates into LAD, intermedius, and circumflex    coronary artery. 3. Circumflex artery:  The circumflex coronary artery is a moderate caliber    vessel (3.0 to 3.25 mm vessel).  It continues as a tiny branch after giving    origin to a moderate sized obtuse marginal #1.  The obtuse marginal #1 has    mid 20% stenosis. 4. Intermedius coronary artery:  The intermedius coronary artery is a small    vessel.  It is disease-free. 5. Left anterior descending artery:  The left anterior descending artery is a    moderate caliber vessel (2.5 mm).  It has moderate to severe diffuse    disease.  In the mid segment, it has shaggy looking about 80-95% stenosis.    The LAD gives origin to a small diagonal #1 which measures 2 mm and a    smaller diagonal #2 which measures 1.5 mm.  The diagonal #2 has an ostial    about 90-95% stenosis.  The diagonal #1 has moderate diffuse disease.  IMPRESSIONS: 1. Normal left ventricular systolic function.  Ejection fraction 60%. 2. High-grade stenosis of the mid left anterior descending artery.  The    left anterior descending artery appears like a diabetic vessel with    diffuse disease and is a small  caliber vessel. 3. An 80% stenosis of the mid right coronary artery.  INTERVENTIONAL DATA:  Successful percutaneous transluminal coronary angioplasty and stenting of the mid left anterior descending artery with a 2.5- x 20-mm Express stent with reduction of stenosis from 95-99% to minus 10%.  The TIMI flow was TIMI-3 to TIMI-3.  There was no evidence of dissection, no evidence of thrombus at the end of the procedure.  The diagonal #2 was well preserved at the end of the procedure.  RECOMMENDATIONS:  Patient will be scheduled for staged intervention of the 80-85% stenosis in the mid right coronary artery.  Aggressive risk factor modification is indicated.  DESCRIPTION OF CATHETERIZATION PROCEDURE:  Under usual  sterile precautions using 6-French right femoral arterial access, a 6-French Multipurpose B2 catheter was advanced into the ascending aorta over a 0.035-mm J wire.  The catheter was gently advanced into the left ventricle.  Left ventricular pressures were monitored.  Hand contrast injection of the left ventricle was performed in both LAO and RAO projections.  The catheter was flushed with saline and pulled back into the ascending aorta.  Pressure gradient across the aortic valve was monitored.  The right coronary artery was selectively engaged and arteriography was performed.  In a similar fashion, the left main coronary artery was selectively engaged and arteriography was performed.  Then the catheter was pulled out of the body in the usual fashion.  DESCRIPTION OF INTERVENTIONAL PROCEDURE:  The 6-French right femoral arterial sheath was exchanged over a 7-French sheath.  Then a 7-French FL4 Medtronic guide was advanced into the ascending aorta over a 0.035-mm J wire.  The catheter was gently advanced and manipulated to engage the left main coronary artery.  Then a 190-cm x 0.014-mm luge guidewire was advanced into the left main coronary artery and then into the left anterior descending artery and the tip of the wire was carefully positioned in the distal left anterior descending artery.  Then a 2.5- x 20-mm Maverick balloon was advanced into the mid left anterior descending artery and after confirming the position of the balloon, balloon angioplasty was performed at 10 atmospheric pressures.  The balloon was returned with the guiding catheter and arteriography was performed. There was a Type B dissection noted in the mid left anterior descending artery.  Then the balloon was exchanged to a 2.5- x 20-mm Express stent and the stent was advanced into the left anterior descending artery and the stent was deployed after confirming the position at a maximum of 14 atmospheres of pressure for  one minute.  The balloon was deflated, pulled back into the guiding catheter, and arteriography was performed.  Because of  step-down in the distal segment in the outflow of the stent, the stent balloon was readvanced into the stent just into the outflow of the stent and a low pressure dilatation of four atmospheric pressures for one minute was performed.  Then the balloon was deflated and pulled back into the guiding catheter.  Intracoronary nitroglycerin was administered and arteriography was performed.  Excellent result was noted.  Then the wire was withdrawn into the proximal left anterior descending artery and arteriography was performed again.  Then the wire was removed out of the body.  The guide catheter was disengaged from the left main and pulled out of the body over a 0.035-mm J wire.  The right femoral arterial access sheath was closed with Perclose.  Excellent hemostasis was obtained.  The patient was transferred to the floor in a stable  condition. Dictated by:   Delrae Rend, M.D. Attending Physician:  Pamella Pert DD:  12/20/00 TD:  12/21/00 Job: [id number]

## 2011-01-20 ENCOUNTER — Other Ambulatory Visit (HOSPITAL_COMMUNITY): Payer: Self-pay | Admitting: Family Medicine

## 2011-01-20 DIAGNOSIS — M545 Low back pain, unspecified: Secondary | ICD-10-CM

## 2011-01-23 ENCOUNTER — Other Ambulatory Visit (HOSPITAL_COMMUNITY): Payer: Self-pay | Admitting: Family Medicine

## 2011-01-23 ENCOUNTER — Ambulatory Visit (HOSPITAL_COMMUNITY)
Admission: RE | Admit: 2011-01-23 | Discharge: 2011-01-23 | Disposition: A | Discharge status: Home / Self Care | Payer: Medicare Other | Source: Ambulatory Visit | Attending: Family Medicine | Admitting: Family Medicine

## 2011-01-23 DIAGNOSIS — M545 Low back pain, unspecified: Secondary | ICD-10-CM | POA: Insufficient documentation

## 2011-01-23 DIAGNOSIS — Z01818 Encounter for other preprocedural examination: Secondary | ICD-10-CM

## 2011-01-29 ENCOUNTER — Ambulatory Visit (HOSPITAL_COMMUNITY)
Admission: RE | Admit: 2011-01-29 | Discharge: 2011-01-29 | Disposition: A | Discharge status: Home / Self Care | Payer: Medicare Other | Source: Ambulatory Visit | Attending: Family Medicine | Admitting: Family Medicine

## 2011-01-29 DIAGNOSIS — M545 Low back pain, unspecified: Secondary | ICD-10-CM | POA: Insufficient documentation

## 2011-01-29 DIAGNOSIS — M5126 Other intervertebral disc displacement, lumbar region: Secondary | ICD-10-CM | POA: Insufficient documentation

## 2012-06-02 ENCOUNTER — Ambulatory Visit (INDEPENDENT_AMBULATORY_CARE_PROVIDER_SITE_OTHER): Payer: PRIVATE HEALTH INSURANCE | Admitting: Physician Assistant

## 2012-06-02 ENCOUNTER — Encounter: Payer: Self-pay | Admitting: Physician Assistant

## 2012-06-02 VITALS — BP 170/80 | HR 73 | Ht 71.0 in | Wt 226.5 lb

## 2012-06-02 DIAGNOSIS — R609 Edema, unspecified: Secondary | ICD-10-CM

## 2012-06-02 DIAGNOSIS — R600 Localized edema: Secondary | ICD-10-CM

## 2012-06-02 DIAGNOSIS — R0600 Dyspnea, unspecified: Secondary | ICD-10-CM

## 2012-06-02 DIAGNOSIS — R140 Abdominal distension (gaseous): Secondary | ICD-10-CM

## 2012-06-02 DIAGNOSIS — Z86718 Personal history of other venous thrombosis and embolism: Secondary | ICD-10-CM

## 2012-06-02 DIAGNOSIS — R0609 Other forms of dyspnea: Secondary | ICD-10-CM

## 2012-06-02 DIAGNOSIS — I10 Essential (primary) hypertension: Secondary | ICD-10-CM

## 2012-06-02 DIAGNOSIS — R0989 Other specified symptoms and signs involving the circulatory and respiratory systems: Secondary | ICD-10-CM

## 2012-06-02 DIAGNOSIS — E669 Obesity, unspecified: Secondary | ICD-10-CM | POA: Insufficient documentation

## 2012-06-02 DIAGNOSIS — R141 Gas pain: Secondary | ICD-10-CM

## 2012-06-02 LAB — CBC
HCT: 42.5 % (ref 39.0–52.0)
Hemoglobin: 13.9 g/dL (ref 13.0–17.0)
RBC: 5.04 MIL/uL (ref 4.22–5.81)
RDW: 14.5 % (ref 11.5–15.5)
WBC: 8.8 10*3/uL (ref 4.0–10.5)

## 2012-06-02 LAB — COMPREHENSIVE METABOLIC PANEL
AST: 15 U/L (ref 0–37)
Albumin: 4.3 g/dL (ref 3.5–5.2)
BUN: 16 mg/dL (ref 6–23)
CO2: 26 mEq/L (ref 19–32)
Calcium: 10.3 mg/dL (ref 8.4–10.5)
Chloride: 100 mEq/L (ref 96–112)
Potassium: 4.1 mEq/L (ref 3.5–5.3)

## 2012-06-02 MED ORDER — FUROSEMIDE 40 MG PO TABS: 40.0000 mg | ORAL_TABLET | Freq: Every day | ORAL | Status: DC

## 2012-06-02 NOTE — Assessment & Plan Note (Addendum)
According to the patient his lower extremity edema is worse than last year. His abdomen also appears severely distended and tight which to me,  indicates he is volume overloaded.  He does not have noticeable JVD however,  He does have a rather large neck making it difficult to tell. He also does not complain of orthopnea or PND which also leads me to believe this may not be heart failure.   Have added Lasix 40 mg daily the patient's medication regimen which he had been taking a lower dose but ran out.  I've instructed him to weigh himself on a daily basis and make sure that his weight is decreasing. Also asked him to eliminate salt from his diet. We'll give him a prescription for compression hose as his current pair is worn out. I will also order a 2-D echocardiogram, BNP, and CMP to check liver for congestion and albumin levels along with kidney function.  Extremity venous Dopplers showed no evidence of thrombus or thrombi thrombophlebitis were essentially normal lower extremity venous Dopplers. He also had carotid Dopplers that showed no evidence of diameter reduction.

## 2012-06-02 NOTE — Patient Instructions (Addendum)
1. Weigh your self every morning.  If you're not gradually reducing in weight every day, call our office after 5 days 2. Reduce the amount of salt you use daily.  3.  Stop taking Lasix if you experience dizziness and call her office  Wear compression stocking

## 2012-06-02 NOTE — Progress Notes (Signed)
Date:  06/02/2012   ID:  [name], DOB October 20, 1931, MRN [id number]  PCP:  Phillip Stalling, MD  Primary Cardiologist:  Rennis Golden     History of Present Illness: [name] is a 79 y.o. 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  Patient had lower chimney venous Dopplers and carotid Dopplers both studies were essentially normal. No signs of thrombus or thrombophlebitis. Patient presents today after being seen by Dr. Rennis Golden one year ago with continued and worsening lower extremity edema. Patient states his abdomen also is bigger than usual. He reports that he originally wore compression hose for about 2 months but didn't think it was making much difference. He denies any orthopnea or PND he does state that he used to walk walk 3 miles every day but is now down to 1 mile due to dyspnea. He also denies nausea, vomiting, chest pain, abdominal pain. Compared to last year his weight his current weight is up 6 lbs. 8 oz..   Wt Readings from Last 3 Encounters:  06/02/12 226 lb 8 oz (102.74 kg)     History reviewed. No pertinent past medical history.  Current Outpatient Prescriptions  Medication Sig Dispense Refill  . Alogliptin Benzoate (NESINA) 25 MG TABS Take 25 mg by mouth daily.      [name] amLODipine-olmesartan (AZOR) 10-40 MG per tablet Take 1 tablet by mouth daily.      [name] aspirin 325 MG EC tablet Take 325 mg by mouth daily.      . insulin detemir (LEVEMIR) 100 UNIT/ML injection Inject 40 Units into the skin at bedtime.      . rosuvastatin (CRESTOR) 5 MG tablet Take 5 mg by mouth daily.      . silodosin (RAPAFLO) 8 MG CAPS capsule Take 8 mg by mouth daily with breakfast.      . furosemide (LASIX) 40 MG tablet Take 1 tablet (40 mg total) by mouth daily.  90 tablet  3   No current facility-administered medications for this visit.    Allergies:   No Known Allergies  Social History:  The patient  reports  that he quit smoking about 57 years ago. His smoking use included Cigarettes. He smoked 0.00 packs per day. He does not have any smokeless tobacco history on file. He reports that he does not drink alcohol.   ROS:  Please see the history of present illness.  All other systems reviewed and negative.   PHYSICAL EXAM: VS:  BP 170/80  Pulse 73  Ht 5\' 11"  (1.803 m)  Wt 226 lb 8 oz (102.74 kg)  BMI 31.6 kg/m2 Obese, well developed, in no acute distress HEENT: Pupils are equal round react to light accommodation extraocular movements are intact.  Neck: Could not appreciate JVDNo cervical lymphadenopathy. Cardiac: Regular rate and rhythm without murmurs rubs or gallops. Lungs:  clear to auscultation bilaterally, no wheezing, rhonchi or rales Abd: Tense, distended, nontender, positive bowel sounds all quadrants, abdomen was too tense to appreciate hepato-or splenomegaly Ext: 3+ tense lower extremity edema.  2+ radial and dorsalis pedis pulses. Skin: warm and dry Neuro:  Grossly normal  EKG:  Sinus rhythm rate 72 beats per minute. Nonspecific T-wave changes laterally  ASSESSMENT AND PLAN:  Problem List Items Addressed This Visit   Lower extremity edema     According to the patient his lower extremity edema is worse than last year. His abdomen also appears severely  distended and tight which to me,  indicates he is volume overloaded.  He does not have noticeable JVD however,  He does have a rather large neck making it difficult to tell. He also does not complain of orthopnea or PND which also leads me to believe this may not be heart failure.   Have added Lasix 40 mg daily the patient's medication regimen which he had been taking a lower dose but ran out.  I've instructed him to weigh himself on a daily basis and make sure that his weight is decreasing. Also asked him to eliminate salt from his diet. We'll give him a prescription for compression hose as his current pair is worn out. I will also order a  2-D echocardiogram, BNP, and CMP to check liver for congestion and albumin levels along with kidney function.  Extremity venous Dopplers showed no evidence of thrombus or thrombi thrombophlebitis were essentially normal lower extremity venous Dopplers. He also had carotid Dopplers that showed no evidence of diameter reduction.    Relevant Orders      Ambulatory Referral for DME   Dyspnea   Relevant Orders      EKG 12-Lead      2D Echocardiogram with contrast      CBC      B Nat Peptide   Essential hypertension   Relevant Medications      rosuvastatin (CRESTOR) 5 MG tablet      amLODipine-olmesartan (AZOR) 10-40 MG per tablet      aspirin 325 MG EC tablet      furosemide (LASIX) tablet   History of DVT (deep vein thrombosis)   Relevant Orders      Ambulatory Referral for DME    Other Visit Diagnoses   Bilateral lower extremity edema    -  Primary    Relevant Medications       furosemide (LASIX) tablet    Other Relevant Orders       Ambulatory Referral for DME    Distended abdomen        Relevant Orders       Comp Met (CMET)

## 2012-06-03 ENCOUNTER — Telehealth: Payer: Self-pay | Admitting: Physician Assistant

## 2012-06-03 LAB — BRAIN NATRIURETIC PEPTIDE: Brain Natriuretic Peptide: 24.5 pg/mL (ref 0.0–100.0)

## 2012-06-03 NOTE — Telephone Encounter (Signed)
Spoke w/ S. Daphine Deutscher, RN and informed compression is 20-75mm knee highs.  Returned call and informed staff of this.  Verbalized understanding.

## 2012-06-03 NOTE — Telephone Encounter (Signed)
She need to know the compression of his hose? Prescription was written yesterday by Brian-Pt is there now waiting!

## 2012-06-20 ENCOUNTER — Telehealth (HOSPITAL_COMMUNITY): Payer: Self-pay | Admitting: Physician Assistant

## 2012-06-20 NOTE — Telephone Encounter (Signed)
Cancelled Echo as we were unable to obtain a [facility] [place] [facility] referral. Called [facility] (listed as PCP on [facility]) and spoke with, Diane who stated patient had never been seen. She stated that they do not give one time referrals even though the test was ordered for a symptomatic patient. Diane suggested we obtain a referral override from [facility].   Submitted a referral override through [facility] via phone and given tracking #[id number]. Called [facility] to check the status and spoke with [facility] who stated [facility] would not give referral override as they show the patient has had [facility] as their PCP since 2012 and must establish care to obtain referral. They were also aware that the patient was symptomatic. [facility] call ref# [id number].   I called pt. to notify [facility] would not pay for Echo until he established with PCP listed-[facility]- or he updated his PCP with his [facility] psychotherapist. Patient voiced lack of understanding. I spent 8 minutes on the phone trying to help the patient understand what next steps he must take. Notified the patient that he must call back to reschedule his Echo and follow up OV once this is resolved. I told him it was very important that he get in touch with his worker or [facility] as soon as he can. He stated he would try to get in touch with someone this week. I offered to give him the number to [facility] to establish care, he declined. I offered to give him my extension should he have any further questions, he declined. I notified ordering PA Wilburt Finlay that we must cancel both appts and the reasononing why-he agreed to cancel.

## 2012-06-21 ENCOUNTER — Ambulatory Visit (HOSPITAL_COMMUNITY): Payer: PRIVATE HEALTH INSURANCE

## 2012-06-29 ENCOUNTER — Ambulatory Visit: Payer: PRIVATE HEALTH INSURANCE | Admitting: Internal Medicine

## 2012-12-08 ENCOUNTER — Encounter (HOSPITAL_COMMUNITY): Payer: Self-pay | Admitting: Emergency Medicine

## 2012-12-08 ENCOUNTER — Inpatient Hospital Stay (HOSPITAL_COMMUNITY)
Admission: EM | Admit: 2012-12-08 | Discharge: 2012-12-14 | DRG: 690 | Disposition: A | Discharge status: Home / Self Care | Payer: PRIVATE HEALTH INSURANCE | Attending: Family Medicine | Admitting: Family Medicine

## 2012-12-08 DIAGNOSIS — I959 Hypotension, unspecified: Secondary | ICD-10-CM | POA: Diagnosis present

## 2012-12-08 DIAGNOSIS — I2510 Atherosclerotic heart disease of native coronary artery without angina pectoris: Secondary | ICD-10-CM | POA: Diagnosis present

## 2012-12-08 DIAGNOSIS — G8929 Other chronic pain: Secondary | ICD-10-CM | POA: Diagnosis present

## 2012-12-08 DIAGNOSIS — I129 Hypertensive chronic kidney disease with stage 1 through stage 4 chronic kidney disease, or unspecified chronic kidney disease: Secondary | ICD-10-CM | POA: Diagnosis present

## 2012-12-08 DIAGNOSIS — N182 Chronic kidney disease, stage 2 (mild): Secondary | ICD-10-CM | POA: Diagnosis present

## 2012-12-08 DIAGNOSIS — Z794 Long term (current) use of insulin: Secondary | ICD-10-CM

## 2012-12-08 DIAGNOSIS — N390 Urinary tract infection, site not specified: Principal | ICD-10-CM | POA: Diagnosis present

## 2012-12-08 DIAGNOSIS — E119 Type 2 diabetes mellitus without complications: Secondary | ICD-10-CM | POA: Diagnosis present

## 2012-12-08 DIAGNOSIS — N401 Enlarged prostate with lower urinary tract symptoms: Secondary | ICD-10-CM | POA: Diagnosis present

## 2012-12-08 DIAGNOSIS — IMO0002 Reserved for concepts with insufficient information to code with codable children: Secondary | ICD-10-CM | POA: Diagnosis present

## 2012-12-08 DIAGNOSIS — N179 Acute kidney failure, unspecified: Secondary | ICD-10-CM | POA: Diagnosis present

## 2012-12-08 DIAGNOSIS — E785 Hyperlipidemia, unspecified: Secondary | ICD-10-CM | POA: Diagnosis present

## 2012-12-08 DIAGNOSIS — N320 Bladder-neck obstruction: Secondary | ICD-10-CM | POA: Diagnosis present

## 2012-12-08 DIAGNOSIS — N138 Other obstructive and reflux uropathy: Secondary | ICD-10-CM | POA: Diagnosis present

## 2012-12-08 DIAGNOSIS — E860 Dehydration: Secondary | ICD-10-CM

## 2012-12-08 DIAGNOSIS — Z87440 Personal history of urinary (tract) infections: Secondary | ICD-10-CM

## 2012-12-08 DIAGNOSIS — Z951 Presence of aortocoronary bypass graft: Secondary | ICD-10-CM

## 2012-12-08 HISTORY — DX: Benign prostatic hyperplasia without lower urinary tract symptoms: N40.0

## 2012-12-08 NOTE — ED Notes (Signed)
Pt c/o weakness, cramping and sob x 2 hours.

## 2012-12-08 NOTE — ED Provider Notes (Signed)
CSN: [id number]     Arrival date & time 12/08/12  2334 History  This chart was scribed for Joya Gaskins, MD by Bennett Scrape, ED Scribe. This patient was seen in room APA08/APA08 and the patient's care was started at 11:55 AM.   Chief Complaint  Patient presents with  . Weakness    Patient is a 79 y.o. male presenting with weakness. The history is provided by the patient. No language interpreter was used.  Weakness This is a new problem. The current episode started 1 to 2 hours ago. The problem occurs constantly. The problem has been gradually worsening. Associated symptoms include shortness of breath. Pertinent negatives include no chest pain and no abdominal pain. Nothing aggravates the symptoms. Nothing relieves the symptoms. He has tried nothing for the symptoms.    HPI Comments: MOSS BERRY is a 78 y.o. male who presents to the Emergency Department brought in by ambulance complaining of weakness with associated SOB that began earlier that day. He reports cramps in his legs and hands as well. He states he was at home and went to get up but fell down causing him concern. He denies injuring anything as a result of the fall but states it took approximately 20 minutes for him to get back up. He denies LOC. He denies any prior episodes of the same. He is unsure as to whether or not he has any fever. He denies abdominal pain. No prior injury related to CC. He denies any recent medication changes. He reports a chronic h/o back pain but denies any recent flare ups.  PCP is Delbert Harness  Past Medical History  Diagnosis Date  . Diabetes mellitus without complication   . Hypertension   . BPH (benign prostatic hyperplasia)    Past Surgical History  Procedure Laterality Date  . Cardiac surgery    . Coronary artery bypass graft     No family history on file. History  Substance Use Topics  . Smoking status: Never Smoker   . Smokeless tobacco: Not on file  . Alcohol Use: No    Review  of Systems  Constitutional: Negative for fever and appetite change.  Respiratory: Positive for shortness of breath.   Cardiovascular: Negative for chest pain.  Gastrointestinal: Negative for abdominal pain.  Musculoskeletal: Positive for back pain (chronic, no changes) and myalgias.  Neurological: Positive for weakness. Negative for syncope.  All other systems reviewed and are negative.    Allergies  Review of patient's allergies indicates no known allergies.  Home Medications   Current Outpatient Rx  Name  Route  Sig  Dispense  Refill  . Alogliptin Benzoate (NESINA) 25 MG TABS   Oral   Take 25 mg by mouth.         . insulin glargine (LANTUS) 100 UNIT/ML injection   Subcutaneous   Inject 40 Units into the skin at bedtime.         . Olmesartan-Amlodipine-HCTZ (TRIBENZOR) 20-5-12.5 MG TABS   Oral   Take by mouth.         . silodosin (RAPAFLO) 8 MG CAPS capsule   Oral   Take 8 mg by mouth daily with breakfast.          Triage Vitals: BP 91/44  Pulse 78  Temp(Src) 97.3 F (36.3 C) (Oral)  Resp 16  Ht 5' 11.5" (1.816 m)  Wt 220 lb (99.791 kg)  BMI 30.26 kg/m2  SpO2 94%  Physical Exam  Nursing note and vitals reviewed.  CONSTITUTIONAL: Well developed/well nourished HEAD: Normocephalic/atraumatic EYES: EOMI/PERRL ENMT: Mucous membranes moist NECK: supple no meningeal signs SPINE:entire spine nontender CV: S1/S2 noted, no murmurs/rubs/gallops noted LUNGS: Lungs are clear to auscultation bilaterally, no apparent distress ABDOMEN: soft, nontender, no rebound or guarding NEURO: Pt is awake/alert, moves all extremitiesx4, no arm or leg drift EXTREMITIES: pulses normal, full ROM, no deformity and no tenderness SKIN: warm, color normal PSYCH: no abnormalities of mood noted  ED Course  Procedures (including critical care time)  DIAGNOSTIC STUDIES: Oxygen Saturation is 94% on room air, adequate by my interpretation.    COORDINATION OF CARE: 11:55  AM-Discussed treatment plan which includes BMP, CBC, and UA with pt at bedside and pt agreed to plan.   2:54 AM Pt improved His SBP has improved He is in no distress However, he appears to have uti and is dehydrated D/w dr Renard Matter who is on call for dondiego Will admit to med-surg for IV antibiotics and IV rehydration   Labs Review Labs Reviewed  BASIC METABOLIC PANEL  CBC WITH DIFFERENTIAL  URINALYSIS, ROUTINE W REFLEX MICROSCOPIC   Imaging Review No results found.  EKG Interpretation   None       MDM  No diagnosis found. Nursing notes including past medical history and social history reviewed and considered in documentation xrays reviewed and considered Labs/vital reviewed and considered    Date: 12/09/2012  Rate: 67  Rhythm: normal sinus rhythm  QRS Axis: normal  Intervals: normal  ST/T Wave abnormalities: nonspecific ST changes  Conduction Disutrbances:none     I personally performed the services described in this documentation, which was scribed in my presence. The recorded information has been reviewed and is accurate.       Joya Gaskins, MD 12/09/12 [phone]

## 2012-12-09 ENCOUNTER — Emergency Department (HOSPITAL_COMMUNITY): Payer: PRIVATE HEALTH INSURANCE

## 2012-12-09 DIAGNOSIS — N390 Urinary tract infection, site not specified: Secondary | ICD-10-CM | POA: Diagnosis present

## 2012-12-09 LAB — URINE MICROSCOPIC-ADD ON

## 2012-12-09 LAB — BASIC METABOLIC PANEL
BUN: 39 mg/dL — ABNORMAL HIGH (ref 6–23)
Chloride: 87 mEq/L — ABNORMAL LOW (ref 96–112)
GFR calc non Af Amer: 32 mL/min — ABNORMAL LOW (ref >90)
Glucose, Bld: 224 mg/dL — ABNORMAL HIGH (ref 70–99)
Potassium: 3.5 mEq/L (ref 3.5–5.1)

## 2012-12-09 LAB — CBC WITH DIFFERENTIAL/PLATELET
Basophils Relative: 0 % (ref 0–1)
Eosinophils Absolute: 0.1 10*3/uL (ref 0.0–0.7)
Eosinophils Relative: 1 % (ref 0–5)
HCT: 38.6 % — ABNORMAL LOW (ref 39.0–52.0)
Hemoglobin: 13.8 g/dL (ref 13.0–17.0)
MCH: 29.2 pg (ref 26.0–34.0)
MCHC: 35.8 g/dL (ref 30.0–36.0)
MCV: 81.8 fL (ref 78.0–100.0)
Monocytes Absolute: 0.8 10*3/uL (ref 0.1–1.0)
Monocytes Relative: 8 % (ref 3–12)
WBC: 10.6 10*3/uL — ABNORMAL HIGH (ref 4.0–10.5)

## 2012-12-09 LAB — GLUCOSE, CAPILLARY: Glucose-Capillary: 187 mg/dL — ABNORMAL HIGH (ref 70–99)

## 2012-12-09 LAB — URINALYSIS, ROUTINE W REFLEX MICROSCOPIC
Bilirubin Urine: NEGATIVE
Hgb urine dipstick: NEGATIVE
Ketones, ur: NEGATIVE mg/dL
Nitrite: NEGATIVE
Urobilinogen, UA: 0.2 mg/dL (ref 0.0–1.0)

## 2012-12-09 LAB — POCT I-STAT TROPONIN I: Troponin i, poc: 0.02 ng/mL (ref 0.00–0.08)

## 2012-12-09 LAB — FREE PSA
PSA, Free Pct: 27 % (ref >25)
PSA, Free: 0.47 ng/mL

## 2012-12-09 MED ORDER — ASPIRIN 81 MG PO CHEW
81.0000 mg | CHEWABLE_TABLET | Freq: Every day | ORAL | Status: DC
  Administered 2012-12-09 – 2012-12-13 (×4): 81 mg via ORAL
  Filled 2012-12-09 (×4): qty 1

## 2012-12-09 MED ORDER — TAMSULOSIN HCL 0.4 MG PO CAPS
0.4000 mg | ORAL_CAPSULE | Freq: Every day | ORAL | Status: DC
  Administered 2012-12-09 – 2012-12-13 (×4): 0.4 mg via ORAL
  Filled 2012-12-09 (×4): qty 1

## 2012-12-09 MED ORDER — ENOXAPARIN SODIUM 40 MG/0.4ML ~~LOC~~ SOLN
40.0000 mg | SUBCUTANEOUS | Status: DC
  Administered 2012-12-09 – 2012-12-14 (×4): 40 mg via SUBCUTANEOUS
  Filled 2012-12-09 (×4): qty 0.4

## 2012-12-09 MED ORDER — DEXTROSE 5 % IV SOLN
1.0000 g | INTRAVENOUS | Status: DC
  Administered 2012-12-10 – 2012-12-14 (×5): 1 g via INTRAVENOUS
  Filled 2012-12-09 (×5): qty 10

## 2012-12-09 MED ORDER — INSULIN ASPART 100 UNIT/ML ~~LOC~~ SOLN: 0.0000 [IU] | Freq: Every day | SUBCUTANEOUS | Status: DC

## 2012-12-09 MED ORDER — LORAZEPAM 1 MG PO TABS
1.0000 mg | ORAL_TABLET | Freq: Every day | ORAL | Status: DC
  Administered 2012-12-09 – 2012-12-13 (×5): 1 mg via ORAL
  Filled 2012-12-09 (×5): qty 1

## 2012-12-09 MED ORDER — SODIUM CHLORIDE 0.9 % IV BOLUS (SEPSIS)
500.0000 mL | Freq: Once | INTRAVENOUS | Status: AC
  Administered 2012-12-09: 500 mL via INTRAVENOUS

## 2012-12-09 MED ORDER — INSULIN ASPART 100 UNIT/ML ~~LOC~~ SOLN
0.0000 [IU] | Freq: Three times a day (TID) | SUBCUTANEOUS | Status: DC
  Administered 2012-12-09: 15 [IU] via SUBCUTANEOUS
  Administered 2012-12-09: 11 [IU] via SUBCUTANEOUS
  Administered 2012-12-09: 15 [IU] via SUBCUTANEOUS
  Administered 2012-12-10: 11 [IU] via SUBCUTANEOUS
  Administered 2012-12-10: 8 [IU] via SUBCUTANEOUS

## 2012-12-09 MED ORDER — INSULIN GLARGINE 100 UNIT/ML ~~LOC~~ SOLN
45.0000 [IU] | Freq: Every day | SUBCUTANEOUS | Status: DC
  Administered 2012-12-09 – 2012-12-13 (×5): 45 [IU] via SUBCUTANEOUS
  Filled 2012-12-09 (×5): qty 0.45

## 2012-12-09 MED ORDER — OXYCODONE HCL ER 10 MG PO T12A
10.0000 mg | EXTENDED_RELEASE_TABLET | Freq: Two times a day (BID) | ORAL | Status: DC
  Administered 2012-12-09 – 2012-12-13 (×9): 10 mg via ORAL
  Filled 2012-12-09 (×9): qty 1

## 2012-12-09 MED ORDER — DEXTROSE 5 % IV SOLN
1.0000 g | Freq: Once | INTRAVENOUS | Status: AC
  Administered 2012-12-09: 1 g via INTRAVENOUS
  Filled 2012-12-09: qty 10

## 2012-12-09 MED ORDER — INSULIN GLARGINE 100 UNIT/ML ~~LOC~~ SOLN
25.0000 [IU] | Freq: Every day | SUBCUTANEOUS | Status: DC
  Filled 2012-12-09 (×3): qty 0.25

## 2012-12-09 MED ORDER — SODIUM CHLORIDE 0.9 % IV BOLUS (SEPSIS)
1000.0000 mL | Freq: Once | INTRAVENOUS | Status: AC
  Administered 2012-12-09: 1000 mL via INTRAVENOUS

## 2012-12-09 NOTE — Consult Note (Signed)
Pt with significant prostatism needs wu will schedule cysto on Monday under local at 1pm . Will write formal note in am thank you.

## 2012-12-09 NOTE — Progress Notes (Signed)
Pt's BG 436- Dr Janna Arch notified, ordered to give 15 units at lunch to cover this BG.
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
## 2012-12-09 NOTE — ED Notes (Signed)
Daughter called and stated that he normally has high blood pressure and that his sugar runs high.

## 2012-12-09 NOTE — Progress Notes (Signed)
Inpatient Diabetes Program Recommendations  AACE/ADA: New Consensus Statement on Inpatient Glycemic Control (2013)  Target Ranges:  Prepandial:   less than 140 mg/dL      Peak postprandial:   less than 180 mg/dL (1-2 hours)      Critically ill patients:  140 - 180 mg/dL  Results for [name], [name] (MRN [id number]) as of 12/09/2012 14:02  Ref. Range 12/09/2012 00:24  Glucose Latest Range: 70-99 mg/dL 409 (H)   Results for [name], [name] (MRN [id number]) as of 12/09/2012 14:02  Ref. Range 12/09/2012 07:29 12/09/2012 11:45  Glucose-Capillary Latest Range: 70-99 mg/dL 956 (H) 213 (H)   Inpatient Diabetes Program Recommendations  Insulin - Basal: According to the home medication list, patient last took Lantus 40 units QHS on 12/07/12.  Noted Lantus 25 units QHS is ordered to start tonight at bedtime.   MD - May want to consider changing frequency to Q24H (to start now) since blood glucose > 346 mg/dl since 7am.    [id number]: Please consider ordering an A1C to determine glycemic control over the past 2-3 months.  Thanks, Orlando Penner, RN, MSN, CCRN Diabetes Coordinator Inpatient Diabetes Program [phone] (Team Pager) [phone] (AP office) [phone] [place] [facility] office)

## 2012-12-09 NOTE — H&P (Signed)
[id number] 

## 2012-12-09 NOTE — ED Notes (Signed)
Patient unable to stand at this time. Dr. Bebe Shaggy notified.

## 2012-12-09 NOTE — Progress Notes (Signed)
ANTICOAGULATION CONSULT NOTE - Initial Consult  Pharmacy Consult for Lovenox Indication: VTE prophylaxis  No Known Allergies  Patient Measurements: Height: 5' 11.5" (181.6 cm) Weight: 203 lb 6.4 oz (92.262 kg) IBW/kg (Calculated) : 76.45  Vital Signs: Temp: 97.6 F (36.4 C) (12/05 0410) Temp src: Oral (12/05 0410) BP: 136/69 mmHg (12/05 0410) Pulse Rate: 72 (12/05 0410)  Labs:  Recent Labs  12/09/12 0024  HGB 13.8  HCT 38.6*  PLT 183  CREATININE 1.91*    Estimated Creatinine Clearance: 36.7 ml/min (by C-G formula based on Cr of 1.91).   Medical History: Past Medical History  Diagnosis Date  . Diabetes mellitus without complication   . Hypertension   . BPH (benign prostatic hyperplasia)     Medications:  Scheduled:  . aspirin  81 mg Oral Daily  . [START ON 12/10/2012] cefTRIAXone (ROCEPHIN)  IV  1 g Intravenous Q24H  . insulin aspart  0-15 Units Subcutaneous TID WC  . insulin aspart  0-5 Units Subcutaneous QHS  . insulin glargine  25 Units Subcutaneous QHS  . OxyCODONE  10 mg Oral Q12H  . tamsulosin  0.4 mg Oral Daily    Assessment: 79 yo M admitted with UTI to receive Lovenox for VTE px while hospitalized.  No bleeding noted, CBC reviewed.  Estimated CrCl> 38ml/min and anticipate this will continue to improve as patient is hydrated.   Plan:  Lovenox 40mg  sq daily. Pharmacy to sign off.  Please re-consult as needed.   Elson Clan 12/09/2012,8:25 AM

## 2012-12-10 LAB — GLUCOSE, CAPILLARY
Glucose-Capillary: 306 mg/dL — ABNORMAL HIGH (ref 70–99)
Glucose-Capillary: 186 mg/dL — ABNORMAL HIGH (ref 70–99)
Glucose-Capillary: 259 mg/dL — ABNORMAL HIGH (ref 70–99)

## 2012-12-10 LAB — HEPATIC FUNCTION PANEL
ALT: 11 U/L (ref 0–53)
AST: 15 U/L (ref 0–37)
Albumin: 3.2 g/dL — ABNORMAL LOW (ref 3.5–5.2)
Alkaline Phosphatase: 84 U/L (ref 39–117)
Bilirubin, Direct: <0.1 mg/dL (ref 0.0–0.3)
Total Bilirubin: 0.2 mg/dL — ABNORMAL LOW (ref 0.3–1.2)
Total Protein: 6.2 g/dL (ref 6.0–8.3)

## 2012-12-10 LAB — BASIC METABOLIC PANEL
BUN: 33 mg/dL — ABNORMAL HIGH (ref 6–23)
CO2: 29 mEq/L (ref 19–32)
Calcium: 9.2 mg/dL (ref 8.4–10.5)
Glucose, Bld: 332 mg/dL — ABNORMAL HIGH (ref 70–99)
Potassium: 4.2 mEq/L (ref 3.5–5.1)
Sodium: 135 mEq/L (ref 135–145)

## 2012-12-10 LAB — URINE CULTURE

## 2012-12-10 MED ORDER — ENOXAPARIN SODIUM 40 MG/0.4ML ~~LOC~~ SOLN: 40.0000 mg | Freq: Once | SUBCUTANEOUS | Status: DC

## 2012-12-10 MED ORDER — INSULIN ASPART 100 UNIT/ML ~~LOC~~ SOLN
0.0000 [IU] | Freq: Three times a day (TID) | SUBCUTANEOUS | Status: DC
  Administered 2012-12-10: 4 [IU] via SUBCUTANEOUS
  Administered 2012-12-11 – 2012-12-12 (×3): 7 [IU] via SUBCUTANEOUS
  Administered 2012-12-13: 3 [IU] via SUBCUTANEOUS
  Administered 2012-12-13 (×2): 15 [IU] via SUBCUTANEOUS
  Administered 2012-12-14: 3 [IU] via SUBCUTANEOUS

## 2012-12-10 MED ORDER — INSULIN ASPART 100 UNIT/ML ~~LOC~~ SOLN
0.0000 [IU] | Freq: Every day | SUBCUTANEOUS | Status: DC
  Administered 2012-12-10: 3 [IU] via SUBCUTANEOUS
  Administered 2012-12-12: 1 [IU] via SUBCUTANEOUS
  Administered 2012-12-13: 3 [IU] via SUBCUTANEOUS

## 2012-12-10 NOTE — Progress Notes (Signed)
Utilization Review completed.  

## 2012-12-10 NOTE — Consult Note (Signed)
Note [phone]
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
## 2012-12-10 NOTE — Progress Notes (Signed)
[id number] 

## 2012-12-11 LAB — BASIC METABOLIC PANEL
BUN: 20 mg/dL (ref 6–23)
CO2: 28 mEq/L (ref 19–32)
Calcium: 9.2 mg/dL (ref 8.4–10.5)
Chloride: 104 mEq/L (ref 96–112)
Creatinine, Ser: 1.09 mg/dL (ref 0.50–1.35)
GFR calc Af Amer: 73 mL/min — ABNORMAL LOW (ref >90)
GFR calc non Af Amer: 63 mL/min — ABNORMAL LOW (ref >90)

## 2012-12-11 LAB — GLUCOSE, CAPILLARY
Glucose-Capillary: 117 mg/dL — ABNORMAL HIGH (ref 70–99)
Glucose-Capillary: 235 mg/dL — ABNORMAL HIGH (ref 70–99)
Glucose-Capillary: 208 mg/dL — ABNORMAL HIGH (ref 70–99)
Glucose-Capillary: 164 mg/dL — ABNORMAL HIGH (ref 70–99)

## 2012-12-11 LAB — HEPATIC FUNCTION PANEL
ALT: 11 U/L (ref 0–53)
Albumin: 3 g/dL — ABNORMAL LOW (ref 3.5–5.2)
Bilirubin, Direct: <0.1 mg/dL (ref 0.0–0.3)
Total Protein: 6.2 g/dL (ref 6.0–8.3)
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
## 2012-12-11 NOTE — Progress Notes (Signed)
[id number] 

## 2012-12-11 NOTE — Progress Notes (Signed)
Pt voided approx , post void bladder scan revealed <63mL remaining in bladder

## 2012-12-11 NOTE — Progress Notes (Signed)
NAMEIOKEPA, GEFFRE               ACCOUNT NO.:  [id number]  MEDICAL RECORD NO.:  [id number]  LOCATION:  A331                          FACILITY:  [facility]  PHYSICIAN:  Melvyn Novas, MDDATE OF BIRTH:  1931/09/07  DATE OF PROCEDURE: DATE OF DISCHARGE:                                PROGRESS NOTE   The patient has insulin-dependent diabetes, severe prostatism, recurrent UTI, possible bladder outlet obstruction, seen by Urology who plans cystoscopy and possible TURP on Monday.  Currently on Rocephin IV. Doing well.  Suboptimal glycemic control.  We will increase his a.c. and h.s. insulin to resistance scale.  Continue Lantus 45 at night.  The patient asked to ambulate to maintain strength.  VITAL SIGNS:  Blood pressure is 111/70, temperature is 98.4, respiratory rate is 22, pulse is 71 and regular. LUNGS:  Clear to A and P.  No rales, wheeze, or rhonchi. HEART:  Regular rhythm.  No murmurs, gallops, or rubs. ABDOMEN:  Soft, nontender.  Bowel sounds normoactive.  Continue Rocephin.  Cystoscopy and possible TURP.     Melvyn Novas, MD     RMD/MEDQ  D:  12/10/2012  T:  12/11/2012  Job:  [id number]

## 2012-12-11 NOTE — Progress Notes (Signed)
For cysto tomorrow residual yesterday 200cc. i asked the nurse do another bladder scan today to see residual.

## 2012-12-12 ENCOUNTER — Encounter (HOSPITAL_COMMUNITY): Payer: Self-pay | Admitting: *Deleted

## 2012-12-12 ENCOUNTER — Encounter (HOSPITAL_COMMUNITY)
Admission: EM | Disposition: A | Discharge status: Home / Self Care | Payer: Self-pay | Source: Home / Self Care | Attending: Family Medicine

## 2012-12-12 HISTORY — PX: CYSTOSCOPY: SHX5120

## 2012-12-12 LAB — BASIC METABOLIC PANEL
Calcium: 9.8 mg/dL (ref 8.4–10.5)
Chloride: 104 mEq/L (ref 96–112)
GFR calc Af Amer: 90 mL/min — ABNORMAL LOW (ref >90)
GFR calc non Af Amer: 77 mL/min — ABNORMAL LOW (ref >90)
Glucose, Bld: 125 mg/dL — ABNORMAL HIGH (ref 70–99)
Potassium: 3.9 mEq/L (ref 3.5–5.1)
Sodium: 140 mEq/L (ref 135–145)

## 2012-12-12 LAB — GLUCOSE, CAPILLARY
Glucose-Capillary: 211 mg/dL — ABNORMAL HIGH (ref 70–99)
Glucose-Capillary: 175 mg/dL — ABNORMAL HIGH (ref 70–99)
Glucose-Capillary: 219 mg/dL — ABNORMAL HIGH (ref 70–99)
Glucose-Capillary: 204 mg/dL — ABNORMAL HIGH (ref 70–99)

## 2012-12-12 LAB — HEPATIC FUNCTION PANEL
ALT: 11 U/L (ref 0–53)
AST: 13 U/L (ref 0–37)
Albumin: 3.1 g/dL — ABNORMAL LOW (ref 3.5–5.2)
Alkaline Phosphatase: 72 U/L (ref 39–117)
Bilirubin, Direct: <0.1 mg/dL (ref 0.0–0.3)
Total Protein: 6.4 g/dL (ref 6.0–8.3)

## 2012-12-12 SURGERY — CYSTOSCOPY, FLEXIBLE
Anesthesia: LOCAL | Site: Bladder

## 2012-12-12 MED ORDER — LIDOCAINE HCL 2 % EX GEL
CUTANEOUS | Status: AC
  Filled 2012-12-12: qty 10

## 2012-12-12 MED ORDER — LIDOCAINE HCL 2 % EX GEL
CUTANEOUS | Status: DC | PRN
  Administered 2012-12-12: 1 via URETHRAL

## 2012-12-12 MED ORDER — SODIUM CHLORIDE 0.9 % IR SOLN
Status: DC | PRN
  Administered 2012-12-12: 3000 mL via INTRAVESICAL

## 2012-12-12 MED ORDER — AMLODIPINE BESYLATE 5 MG PO TABS
5.0000 mg | ORAL_TABLET | Freq: Every day | ORAL | Status: DC
  Administered 2012-12-13: 5 mg via ORAL
  Filled 2012-12-12: qty 1

## 2012-12-12 SURGICAL SUPPLY — 18 items
CLOTH BEACON ORANGE TIMEOUT ST (SAFETY) ×2 IMPLANT
DRAPE LAPAROTOMY TRNSV 102X78 (DRAPE) ×2 IMPLANT
DRAPE PROXIMA HALF (DRAPES) ×2 IMPLANT
GLOVE BIO SURGEON STRL SZ7 (GLOVE) ×2 IMPLANT
GLOVE BIOGEL PI IND STRL 6.5 (GLOVE) ×1 IMPLANT
GLOVE BIOGEL PI IND STRL 7.0 (GLOVE) ×2 IMPLANT
GLOVE BIOGEL PI INDICATOR 6.5 (GLOVE) ×1
GLOVE BIOGEL PI INDICATOR 7.0 (GLOVE) ×2
GOWN STRL REIN XL XLG (GOWN DISPOSABLE) ×4 IMPLANT
IV NS IRRIG 3000ML ARTHROMATIC (IV SOLUTION) ×2 IMPLANT
MARKER SKIN DUAL TIP RULER LAB (MISCELLANEOUS) ×2 IMPLANT
SET IRRIG Y TYPE TUR BLADDER L (SET/KITS/TRAYS/PACK) ×2 IMPLANT
SPONGE GAUZE 4X4 12PLY (GAUZE/BANDAGES/DRESSINGS) ×2 IMPLANT
TOWEL OR 17X26 4PK STRL BLUE (TOWEL DISPOSABLE) ×2 IMPLANT
TRAY FOLEY CATH 16FR SILVER (SET/KITS/TRAYS/PACK) IMPLANT
VALVE DISPOSABLE (MISCELLANEOUS) ×2 IMPLANT
WATER STERILE IRR 1000ML POUR (IV SOLUTION) ×2 IMPLANT
YANKAUER SUCT BULB TIP 10FT TU (MISCELLANEOUS) ×2 IMPLANT

## 2012-12-12 NOTE — Progress Notes (Signed)
Inpatient Diabetes Program Recommendations  AACE/ADA: New Consensus Statement on Inpatient Glycemic Control (2013)  Target Ranges:  Prepandial:   less than 140 mg/dL      Peak postprandial:   less than 180 mg/dL (1-2 hours)      Critically ill patients:  140 - 180 mg/dL   Results for [name], [name] (MRN [id number]) as of 12/12/2012 08:58  Ref. Range 12/11/2012 07:30 12/11/2012 11:44 12/11/2012 16:49 12/11/2012 21:45 12/12/2012 07:50  Glucose-Capillary Latest Range: 70-99 mg/dL 409 (H) 811 (H) 914 (H) 164 (H) 114 (H)    Inpatient Diabetes Program Recommendations Insulin - Meal Coverage: Please consider ordering Novolog 3 units TID with meals for meal coverage since postprandial glucose consistently elevated. HgbA1C: Please consider ordering an A1C to determine glycemic control over the past 2-3 months.   Thanks, Phillip Penner, RN, MSN, CCRN Diabetes Coordinator Inpatient Diabetes Program [phone] (Team Pager) [phone] ([facility] office) [phone] [facility] office)

## 2012-12-12 NOTE — Progress Notes (Signed)
He was cystscoped has bph with bladder neck obstruction he needs turp after he is medically cleared.

## 2012-12-12 NOTE — Progress Notes (Signed)
He does have enlargeprostate but uncotrolled diabetes he empties his bladder good his main complaint is frquency he says voids good he will not benefit from turp hisdiabetes needs to be controlled to see if he still having symptoms otherwise goes home on flomax.

## 2012-12-12 NOTE — Progress Notes (Signed)
NAMEABBY, STINES               ACCOUNT NO.:  [id number]  MEDICAL RECORD NO.:  [id number]  LOCATION:  A331                          FACILITY:  [facility]  PHYSICIAN:  Melvyn Novas, MDDATE OF BIRTH:  17-Dec-1931  DATE OF PROCEDURE: DATE OF DISCHARGE:                                PROGRESS NOTE   SUBJECTIVE:  This patient has insulin-dependent diabetes, coronary artery disease status post CABG x4, hypertension, hyperlipidemia, significant BPH with presumed bladder outlet obstruction, and UTI currently on Rocephin and Flomax.  Symptoms are improving of dysuria and frequency.  He has nocturia x5 or 6.  The patient is scheduled to have a cystoscopy and possible TURP tomorrow in a.m.  He is in fair glycemic control, on resistant regimen of NovoLog and 45 units of Lantus.  OBJECTIVE:  VITAL SIGNS:  Blood pressure 144/68, temperature 97.7, pulse 71 and regular, respiratory rate is 18.  No angina or angina equivalents. NECK:  No JVD.  No carotid bruits.  No thyromegaly.  No thyroid bruits. HEART:  Regular rhythm.  No S3-S4 auscultated.  No heaves, thrills, or rubs.  PLAN:  Right now is continue Rocephin, continue fluids, cystoscopy in a.m., and possible TURP.     Melvyn Novas, MD     RMD/MEDQ  D:  12/11/2012  T:  12/12/2012  Job:  [id number]

## 2012-12-12 NOTE — Progress Notes (Signed)
[id number] 

## 2012-12-13 ENCOUNTER — Encounter (HOSPITAL_COMMUNITY): Payer: Self-pay | Admitting: Urology

## 2012-12-13 LAB — GLUCOSE, CAPILLARY: Glucose-Capillary: 319 mg/dL — ABNORMAL HIGH (ref 70–99)

## 2012-12-13 NOTE — Progress Notes (Signed)
[name], [name]               ACCOUNT NO.:  [id number]  MEDICAL RECORD NO.:  [id number]  LOCATION:  A331                          FACILITY:  [facility]  PHYSICIAN:  Melvyn Novas, MDDATE OF BIRTH:  October 16, 1931  DATE OF PROCEDURE: DATE OF DISCHARGE:                                PROGRESS NOTE   The patient has insulin-dependent diabetes, UTI with significant BPH and bladder outlet obstruction, so suspect prostatic hypertrophy, seen in consultation by Urology.  He had some renal failure due to volume depletion, now rectified.  Creatinine 0.94, potassium 3.9, likewise has hypertension.  Norvasc was added back today after initial hypotension and acute renal failure.  PHYSICAL EXAMINATION:  VITAL SIGNS:  Blood pressure 161/52, temperature 97.4, pulse is 67 and regular, respiratory rate is 18.  No angina,angina equivalent, palpitations, dizziness, or syncope. LUNGS:  Clear.  No rales, wheeze, or rhonchi. HEART:  Regular rhythm.  No murmurs, gallops, heaves, thrills, or rubs.  The patient has been on Lovenox and aspirin.  Plan right now is to add Norvasc 5 mg daily.  He is going for cystoscopy and possible TURP today.  The patient understands this and risks involved patient appears free of any clinical or laboratory evidence of ischemic heart disease, and is hemodynamically stable.     Melvyn Novas, MD     RMD/MEDQ  D:  12/12/2012  T:  12/13/2012  Job:  [id number]

## 2012-12-13 NOTE — Progress Notes (Signed)
Inpatient Diabetes Program Recommendations  AACE/ADA: New Consensus Statement on Inpatient Glycemic Control (2013)  Target Ranges:  Prepandial:   less than 140 mg/dL      Peak postprandial:   less than 180 mg/dL (1-2 hours)      Critically ill patients:  140 - 180 mg/dL  Results for [name], [name] (MRN [id number]) as of 12/13/2012 09:08  Ref. Range 12/11/2012 07:30 12/11/2012 11:44 12/11/2012 16:49 12/11/2012 21:45  Glucose-Capillary Latest Range: 70-99 mg/dL 409 (H) 811 (H) 914 (H) 164 (H)   Results for [name], [name] (MRN [id number]) as of 12/13/2012 09:08  Ref. Range 12/12/2012 07:50 12/12/2012 10:51 12/12/2012 11:50 12/12/2012 16:43 12/12/2012 21:52 12/13/2012 07:25  Glucose-Capillary Latest Range: 70-99 mg/dL 086 (H) 578 (H) 469 (H) 219 (H) 204 (H) 149 (H)   Inpatient Diabetes Program Recommendations Insulin - Meal Coverage: Please consider ordering Novolog 3 units TID with meals for meal coverage since postprandial glucose consistently elevated. HgbA1C: Please consider ordering an A1C to determine glycemic control over the past 2-3 months.  Thanks, Orlando Penner, RN, MSN, CCRN Diabetes Coordinator Inpatient Diabetes Program [phone] (Team Pager) [phone] ([facility] office) [phone] [facility] office)

## 2012-12-13 NOTE — Progress Notes (Signed)
[id number] 

## 2012-12-13 NOTE — Op Note (Signed)
[name], [name]               ACCOUNT NO.:  [id number]  MEDICAL RECORD NO.:  [id number]  LOCATION:  A331                          FACILITY:  [facility]  PHYSICIAN:  Ky Barban, M.D.DATE OF BIRTH:  1931-03-06  DATE OF PROCEDURE:  12/12/2012 DATE OF DISCHARGE:                              OPERATIVE REPORT   PREOPERATIVE DIAGNOSIS:  Possible benign prostatic hyperplasia.  POSTOPERATIVE DIAGNOSIS:  Benign prostatic hyperplasia.  PROCEDURE:  Cystoscopy.  DESCRIPTION OF PROCEDURE:  The patient was placed in supine position. After usual prep and drape, after instilling with Xylocaine jelly, waiting adequate time, flexible cystoscope was introduced into the bladder.  Anterior urethra looks normal.  Prostatic urethra was completely obstructed with lateral lobe hypertrophy.  The bladder is 2+ trabeculated.  No tumor, stone, foreign body, or inflammation. Cystoscope was then removed.  The patient left the procedure room in satisfactory condition.     Ky Barban, M.D.     MIJ/MEDQ  D:  12/12/2012  T:  12/13/2012  Job:  [id number]

## 2012-12-14 ENCOUNTER — Encounter (HOSPITAL_COMMUNITY): Payer: Self-pay | Admitting: Urology

## 2012-12-14 LAB — GLUCOSE, CAPILLARY

## 2012-12-14 MED ORDER — LORAZEPAM 1 MG PO TABS: 1.0000 mg | ORAL_TABLET | Freq: Every day | ORAL | Status: DC

## 2012-12-14 MED ORDER — ASPIRIN 81 MG PO CHEW: 81.0000 mg | CHEWABLE_TABLET | Freq: Every day | ORAL | Status: DC

## 2012-12-14 MED ORDER — INSULIN GLARGINE 100 UNIT/ML SOLOSTAR PEN: 45.0000 [IU] | PEN_INJECTOR | Freq: Every day | SUBCUTANEOUS | Status: DC

## 2012-12-14 NOTE — Progress Notes (Signed)
Patient with orders to be discharge home. Discharge instructions given, verbalized understanding. Prescriptions given. Patient stable. Patient left with friend in private vehicle. Escorted by staff.

## 2012-12-14 NOTE — Discharge Summary (Signed)
[id number] 

## 2012-12-14 NOTE — Progress Notes (Signed)
NAMEMASASHI, SNOWDON               ACCOUNT NO.:  [id number]  MEDICAL RECORD NO.:  [id number]  LOCATION:  A331                          FACILITY:  [facility]  PHYSICIAN:  Melvyn Novas, MDDATE OF BIRTH:  Sep 12, 1931  DATE OF PROCEDURE: DATE OF DISCHARGE:  12/14/2012                                PROGRESS NOTE   SUBJECTIVE:  The patient admitted with BPH, presumed bladder outlet obstruction, UTI, insulin-dependent diabetes, hypertension, hyperlipidemia, coronary artery disease, two-vessel disease status post CABG x4.  The patient has cystoscopy yesterday and found to have bladder neck obstruction, some BPH, but Urology did not feel that TURP was indicated as his dysuria and steam was adequate and his frequency may be partially attributable to uncontrolled diabetes.  OBJECTIVE:  VITAL SIGNS:  Blood pressure 137/69, temperature 97.2, pulse 68 and regular, respiratory rate is 20, creatinine has returned to baseline at 0.94.  He had renal failure upon admission with volume depletion. LUNGS:  Clear to A and P.  No rales, wheeze, or rhonchi. HEART:  Regular rhythm.  No murmurs, gallops, or rubs. ABDOMEN:  Soft, nontender.  Bowel sounds normoactive.  PLAN:  Right now is to continue IV hydration.  Monitor renal function and electrolytes.  Monitor glycemic control and discharge within 24 hours.     Melvyn Novas, MD     RMD/MEDQ  D:  12/13/2012  T:  12/14/2012  Job:  [id number]

## 2012-12-15 NOTE — Discharge Summary (Signed)
[name], [name]               ACCOUNT NO.:  [id number]  MEDICAL RECORD NO.:  [id number]  LOCATION:  A331                          FACILITY:  [facility]  PHYSICIAN:  Melvyn Novas, MDDATE OF BIRTH:  November 24, 1931  DATE OF ADMISSION:  12/08/2012 DATE OF DISCHARGE:  12/10/2014LH                              DISCHARGE SUMMARY   The patient is a 79 year old white male, who has insulin dependent diabetes, coronary artery disease, status post CABG x4, hypertension, and hyperlipidemia, who came in with generalized weakness falling and significant dysuria and frequency, found to have BPH, with a normal PSA. He had renal failure with a creatinine of 3 upon admission, this was seen and given aggressive fluid resuscitation.  Creatinine normalized by the day of discharge.  He was found to have a UTI, placed on IV Rocephin on day #1, seen in consultation by Urology.  He had a cystoscopy revealing some bladder outlet obstruction, but they did not feel that TURP was indicated at present.  More attention should be focused on glycemic control as far as decreasing his frequency of urination.  The patient seems to understand that although he is illiterate, he was subsequently discharged on the following medicines; aspirin 81 mg p.o. daily, lorazepam 1 mg p.o. bedtime, Lantus insulin 45 units subcu at bedtime, Nesina 25 mg p.o. daily, Rapaflo 8 mg p.o. daily, and Tribenzor 12.5 p.o. daily.  The patient will follow up in the office in 1 week's time to assess glycemic control and renal function as well as degree of dysuria and frequency.     Melvyn Novas, MD     RMD/MEDQ  D:  12/14/2012  T:  12/15/2012  Job:  [id number]

## 2013-06-30 ENCOUNTER — Other Ambulatory Visit (HOSPITAL_COMMUNITY): Payer: Self-pay | Admitting: Family Medicine

## 2013-06-30 DIAGNOSIS — R1030 Lower abdominal pain, unspecified: Secondary | ICD-10-CM

## 2013-06-30 DIAGNOSIS — K2970 Gastritis, unspecified, without bleeding: Secondary | ICD-10-CM

## 2013-06-30 DIAGNOSIS — R1310 Dysphagia, unspecified: Secondary | ICD-10-CM

## 2013-06-30 DIAGNOSIS — R110 Nausea: Secondary | ICD-10-CM

## 2013-07-10 ENCOUNTER — Inpatient Hospital Stay (HOSPITAL_COMMUNITY): Admission: RE | Admit: 2013-07-10 | Payer: Self-pay | Source: Ambulatory Visit | Admitting: Speech Pathology

## 2013-07-10 ENCOUNTER — Ambulatory Visit (HOSPITAL_COMMUNITY): Admission: RE | Admit: 2013-07-10 | Payer: PRIVATE HEALTH INSURANCE | Source: Ambulatory Visit

## 2013-07-14 ENCOUNTER — Ambulatory Visit (HOSPITAL_COMMUNITY)
Admission: RE | Admit: 2013-07-14 | Discharge: 2013-07-14 | Disposition: A | Discharge status: Home / Self Care | Payer: PRIVATE HEALTH INSURANCE | Source: Ambulatory Visit | Attending: Family Medicine | Admitting: Family Medicine

## 2013-07-14 DIAGNOSIS — R1310 Dysphagia, unspecified: Secondary | ICD-10-CM | POA: Diagnosis present

## 2013-07-14 DIAGNOSIS — R110 Nausea: Secondary | ICD-10-CM | POA: Insufficient documentation

## 2013-08-01 ENCOUNTER — Encounter: Payer: Self-pay | Admitting: Gastroenterology

## 2013-08-01 ENCOUNTER — Ambulatory Visit (INDEPENDENT_AMBULATORY_CARE_PROVIDER_SITE_OTHER): Payer: PRIVATE HEALTH INSURANCE | Admitting: Gastroenterology

## 2013-08-01 VITALS — BP 141/71 | HR 74 | Temp 98.0°F | Ht 69.5 in | Wt 211.6 lb

## 2013-08-01 DIAGNOSIS — R112 Nausea with vomiting, unspecified: Secondary | ICD-10-CM

## 2013-08-01 DIAGNOSIS — R1310 Dysphagia, unspecified: Secondary | ICD-10-CM

## 2013-08-01 DIAGNOSIS — Z860100 Personal history of colon polyps, unspecified: Secondary | ICD-10-CM

## 2013-08-01 MED ORDER — ONDANSETRON HCL 4 MG PO TABS: 4.0000 mg | ORAL_TABLET | Freq: Three times a day (TID) | ORAL | Status: DC

## 2013-08-01 MED ORDER — PEG 3350-KCL-NA BICARB-NACL 420 G PO SOLR: 4000.0000 mL | ORAL | Status: DC

## 2013-08-01 MED ORDER — PANTOPRAZOLE SODIUM 40 MG PO TBEC
40.0000 mg | DELAYED_RELEASE_TABLET | Freq: Every day | ORAL | Status: DC
Start: 2013-08-01 — End: 2013-08-30

## 2013-08-01 NOTE — Patient Instructions (Signed)
Start taking Protonix once each morning, 30 minutes before breakfast. I have provided a prescription.  I have sent Zofran, a nausea medication, to your pharmacy to take with meals.   We have scheduled you for a colonoscopy, upper endoscopy, and dilation with Dr. [name] in the near future.

## 2013-08-01 NOTE — Progress Notes (Signed)
Primary Care Physician:  Tarri Fuller, PA-C Primary Gastroenterologist:  Dr. [name]   Chief Complaint  Patient presents with  . Nausea    x 3 months  . Emesis    HPI:   [name] presents today at the request of his PCP secondary to chronic nausea and vomiting. Last colonoscopy in 2004 with 3 polyps, path unknown. Needs surveillance. Nausea all the time. Intermittent vomiting. No abdominal pain. Rare diarrhea. Last episode about a month ago. No melena. No hematochezia. Wakes up and doesn't want to eat. Has to eat a little something to "keep going". States 2 months ago weighed 230, now 211. Loss of appetite. Has bitter taste in mouth a lot. No PPI on med list. Food sometimes feels like it doesn't "go down as good as it ought to". No odynophagia. Was taking Aleve chronically but stopped several weeks ago.   Past Medical History  Diagnosis Date  . Diabetes mellitus without complication   . Hypertension   . BPH (benign prostatic hyperplasia)   . CAD (coronary artery disease)     Past Surgical History  Procedure Laterality Date  . Cardiac surgery    . Coronary artery bypass graft    . Cystoscopy N/A 12/12/2012    Procedure: CYSTOSCOPY FLEXIBLE;  Surgeon: Marissa Nestle, MD;  Location: [facility];  Service: Urology;  Laterality: N/A;  . Colonoscopy  2004    Dr. Laural Golden: three small polyps at cecum, path unknown, external hemorrhoids    Current Outpatient Prescriptions  Medication Sig Dispense Refill  . amLODipine-olmesartan (AZOR) 10-40 MG per tablet Take 1 tablet by mouth daily.      [name] aspirin 325 MG EC tablet Take 325 mg by mouth daily.      . insulin detemir (LEVEMIR) 100 UNIT/ML injection Inject 20 Units into the skin at bedtime.       . Insulin Glargine (LANTUS SOLOSTAR) 100 UNIT/ML SOPN Inject 45 Units into the skin at bedtime.  5 pen  5  . rosuvastatin (CRESTOR) 5 MG tablet Take 5 mg by mouth daily.      . silodosin (RAPAFLO) 8 MG CAPS capsule Take 8 mg by mouth  daily with breakfast.      . ondansetron (ZOFRAN) 4 MG tablet Take 1 tablet (4 mg total) by mouth 3 (three) times daily with meals.  90 tablet  1  . pantoprazole (PROTONIX) 40 MG tablet Take 1 tablet (40 mg total) by mouth daily. Take 30 minutes prior to breakfast daily.  30 tablet  3   No current facility-administered medications for this visit.    Allergies as of 08/01/2013  . (No Known Allergies)    Family History  Problem Relation Age of Onset  . Colon cancer Son 51    deceased    History   Social History  . Marital Status: Single    Spouse Name: N/A    Number of Children: N/A  . Years of Education: N/A   Occupational History  . Not on file.   Social History Main Topics  . Smoking status: Never Smoker   . Smokeless tobacco: Not on file  . Alcohol Use: No  . Drug Use: No  . Sexual Activity: Not on file   Other Topics Concern  . Not on file   Social History Narrative   ** Merged History Encounter **        Review of Systems: Gen: see HPI CV: Denies chest pain, heart palpitations,  peripheral edema, syncope.  Resp: Denies shortness of breath at rest or with exertion. Denies wheezing or cough.  GI: see HPI GU : Denies urinary burning, urinary frequency, urinary hesitancy MS: back pain Derm: Denies rash, itching, dry skin Psych: Denies depression, anxiety, memory loss, and confusion Heme: Denies bruising, bleeding, and enlarged lymph nodes.  Physical Exam: BP 141/71  Pulse 74  Temp(Src) 98 F (36.7 C) (Oral)  Ht 5' 9.5" (1.765 m)  Wt 211 lb 9.6 oz (95.981 kg)  BMI 30.81 kg/m2 General:   Alert and oriented. Pleasant and cooperative. Well-nourished and well-developed.  Head:  Normocephalic and atraumatic. Eyes:  Without icterus, sclera clear and conjunctiva pink.  Ears:  Normal auditory acuity. Nose:  No deformity, discharge,  or lesions. Mouth:  No deformity or lesions, oral mucosa pink.  Lungs:  Clear to auscultation bilaterally. No wheezes, rales, or  rhonchi. No distress.  Heart:  S1, S2 present without murmurs appreciated.  Abdomen:  +BS, soft, non-tender and non-distended. No HSM noted. Possible umbilical hernia Rectal:  Deferred  Msk:  Symmetrical without gross deformities. Normal posture. Extremities:  Without  edema. Neurologic:  Alert and  oriented x4;  grossly normal neurologically. Skin:  Intact without significant lesions or rashes. Psych:  Alert and cooperative. Normal mood and affect.

## 2013-08-03 ENCOUNTER — Encounter: Payer: Self-pay | Admitting: Gastroenterology

## 2013-08-04 ENCOUNTER — Encounter (HOSPITAL_COMMUNITY): Payer: Self-pay | Admitting: Pharmacy Technician

## 2013-08-05 DIAGNOSIS — Z860100 Personal history of colon polyps, unspecified: Secondary | ICD-10-CM | POA: Insufficient documentation

## 2013-08-05 NOTE — Assessment & Plan Note (Addendum)
BPE negative. Dilation as appropriate at time of EGD.

## 2013-08-05 NOTE — Assessment & Plan Note (Signed)
Last colonoscopy in 2004, polyps X 3 with path unknown. Notes his son has history of colon cancer. Overdue for surveillance. No significant lower GI symptoms.   Proceed with TCS with Dr. [name] in near future: the risks, benefits, and alternatives have been discussed with the patient in detail. The patient states understanding and desires to proceed.

## 2013-08-05 NOTE — Assessment & Plan Note (Addendum)
Chronic, with associated weight loss. Vague esophageal dysphagia but no odynophagia. Query uncontrolled GERD, gastritis, possible delayed gastric emptying in the setting of diabetes. Needs EGD for further evaluation. Also, does not appear to be on a PPI. Start Protonix daily. Zofran for supportive measures. UGI with BPE July 2015 unremarkable without evidence of esophageal dysmotility or narrowing of esophagus.   Proceed with upper endoscopy/dilation in the near future with Dr. [name]. The risks, benefits, and alternatives have been discussed in detail with patient. They have stated understanding and desire to proceed.  Consider GES if negative EGD

## 2013-08-08 NOTE — Progress Notes (Signed)
Cc to PCP 

## 2013-08-16 ENCOUNTER — Encounter (HOSPITAL_COMMUNITY): Payer: Self-pay | Admitting: *Deleted

## 2013-08-16 ENCOUNTER — Ambulatory Visit (HOSPITAL_COMMUNITY)
Admission: RE | Admit: 2013-08-16 | Discharge: 2013-08-16 | Disposition: A | Discharge status: Home / Self Care | Payer: PRIVATE HEALTH INSURANCE | Source: Ambulatory Visit | Attending: Internal Medicine | Admitting: Internal Medicine

## 2013-08-16 ENCOUNTER — Other Ambulatory Visit: Payer: Self-pay | Admitting: Internal Medicine

## 2013-08-16 ENCOUNTER — Encounter (HOSPITAL_COMMUNITY)
Admission: RE | Disposition: A | Discharge status: Home / Self Care | Payer: Self-pay | Source: Ambulatory Visit | Attending: Internal Medicine

## 2013-08-16 DIAGNOSIS — E119 Type 2 diabetes mellitus without complications: Secondary | ICD-10-CM | POA: Insufficient documentation

## 2013-08-16 DIAGNOSIS — R112 Nausea with vomiting, unspecified: Secondary | ICD-10-CM

## 2013-08-16 DIAGNOSIS — I2510 Atherosclerotic heart disease of native coronary artery without angina pectoris: Secondary | ICD-10-CM | POA: Diagnosis not present

## 2013-08-16 DIAGNOSIS — K319 Disease of stomach and duodenum, unspecified: Secondary | ICD-10-CM

## 2013-08-16 DIAGNOSIS — R6881 Early satiety: Secondary | ICD-10-CM | POA: Diagnosis not present

## 2013-08-16 DIAGNOSIS — R1314 Dysphagia, pharyngoesophageal phase: Secondary | ICD-10-CM

## 2013-08-16 DIAGNOSIS — K2960 Other gastritis without bleeding: Secondary | ICD-10-CM | POA: Diagnosis not present

## 2013-08-16 DIAGNOSIS — R1310 Dysphagia, unspecified: Secondary | ICD-10-CM | POA: Diagnosis not present

## 2013-08-16 DIAGNOSIS — Z7982 Long term (current) use of aspirin: Secondary | ICD-10-CM | POA: Diagnosis not present

## 2013-08-16 DIAGNOSIS — Z951 Presence of aortocoronary bypass graft: Secondary | ICD-10-CM | POA: Insufficient documentation

## 2013-08-16 DIAGNOSIS — Z538 Procedure and treatment not carried out for other reasons: Secondary | ICD-10-CM | POA: Insufficient documentation

## 2013-08-16 DIAGNOSIS — K5289 Other specified noninfective gastroenteritis and colitis: Secondary | ICD-10-CM | POA: Diagnosis not present

## 2013-08-16 DIAGNOSIS — Z794 Long term (current) use of insulin: Secondary | ICD-10-CM | POA: Insufficient documentation

## 2013-08-16 DIAGNOSIS — Z79899 Other long term (current) drug therapy: Secondary | ICD-10-CM | POA: Diagnosis not present

## 2013-08-16 DIAGNOSIS — I10 Essential (primary) hypertension: Secondary | ICD-10-CM | POA: Diagnosis not present

## 2013-08-16 DIAGNOSIS — D126 Benign neoplasm of colon, unspecified: Secondary | ICD-10-CM | POA: Insufficient documentation

## 2013-08-16 DIAGNOSIS — Z86010 Personal history of colonic polyps: Secondary | ICD-10-CM

## 2013-08-16 DIAGNOSIS — N400 Enlarged prostate without lower urinary tract symptoms: Secondary | ICD-10-CM | POA: Insufficient documentation

## 2013-08-16 DIAGNOSIS — K3189 Other diseases of stomach and duodenum: Secondary | ICD-10-CM

## 2013-08-16 HISTORY — DX: Gastro-esophageal reflux disease without esophagitis: K21.9

## 2013-08-16 HISTORY — PX: COLONOSCOPY: SHX5424

## 2013-08-16 HISTORY — PX: MALONEY DILATION: SHX5535

## 2013-08-16 HISTORY — PX: ESOPHAGOGASTRODUODENOSCOPY: SHX5428

## 2013-08-16 LAB — CREATININE, SERUM
Creatinine, Ser: 0.86 mg/dL (ref 0.50–1.35)
GFR calc Af Amer: >90 mL/min (ref >90)
GFR calc non Af Amer: 80 mL/min — ABNORMAL LOW (ref >90)

## 2013-08-16 SURGERY — COLONOSCOPY
Anesthesia: Moderate Sedation

## 2013-08-16 MED ORDER — SIMETHICONE 40 MG/0.6ML PO SUSP
ORAL | Status: DC | PRN
  Administered 2013-08-16: 14:00:00

## 2013-08-16 MED ORDER — ONDANSETRON HCL 4 MG/2ML IJ SOLN
INTRAMUSCULAR | Status: DC | PRN
  Administered 2013-08-16: 4 mg via INTRAVENOUS

## 2013-08-16 MED ORDER — ONDANSETRON HCL 4 MG/2ML IJ SOLN
INTRAMUSCULAR | Status: AC
  Filled 2013-08-16: qty 2

## 2013-08-16 MED ORDER — LIDOCAINE VISCOUS 2 % MT SOLN
OROMUCOSAL | Status: DC | PRN
  Administered 2013-08-16: 3 mL via OROMUCOSAL

## 2013-08-16 MED ORDER — MEPERIDINE HCL 100 MG/ML IJ SOLN
INTRAMUSCULAR | Status: AC
  Filled 2013-08-16: qty 2

## 2013-08-16 MED ORDER — MEPERIDINE HCL 100 MG/ML IJ SOLN
INTRAMUSCULAR | Status: DC | PRN
  Administered 2013-08-16 (×2): 25 mg via INTRAVENOUS

## 2013-08-16 MED ORDER — SODIUM CHLORIDE 0.9 % IV SOLN
INTRAVENOUS | Status: DC
Start: 2013-08-16 — End: 2013-08-16

## 2013-08-16 MED ORDER — MIDAZOLAM HCL 5 MG/5ML IJ SOLN
INTRAMUSCULAR | Status: DC | PRN
  Administered 2013-08-16: 2 mg via INTRAVENOUS
  Administered 2013-08-16 (×2): 1 mg via INTRAVENOUS

## 2013-08-16 MED ORDER — MIDAZOLAM HCL 5 MG/5ML IJ SOLN
INTRAMUSCULAR | Status: AC
  Filled 2013-08-16: qty 10

## 2013-08-16 MED ORDER — LIDOCAINE VISCOUS 2 % MT SOLN
OROMUCOSAL | Status: AC
  Filled 2013-08-16: qty 15

## 2013-08-16 NOTE — Op Note (Signed)
[facility] [address] [place], [address]   ENDOSCOPY PROCEDURE REPORT  PATIENT: Phillip, Duncan  MR#: [id number] BIRTHDATE: December 10, 1931 , 82  yrs. old GENDER: Male ENDOSCOPIST: R.  Garfield Cornea, MD FACP FACG REFERRED BY:     Shirleen Schirmer, PA-C PROCEDURE DATE:  08/16/2013 PROCEDURE:     EGD esophageal dilation followed by gastric biopsy  INDICATIONS:      Esophageal dysphagia; early satiety/nausea  INFORMED CONSENT:   The risks, benefits, limitations, alternatives and imponderables have been discussed.  The potential for biopsy, esophogeal dilation, etc. have also been reviewed.  Questions have been answered.  All parties agreeable.  Please see the history and physical in the medical record for more information.  MEDICATIONS:   Versed 3 mg IV and Demerol 50 mg IV in divided doses. Xylocaine gel orally. Zofran 4 mg IV  DESCRIPTION OF PROCEDURE:   The EG-[id number] ([id number])  endoscope was introduced through the mouth and advanced to the second portion of the duodenum without difficulty or limitations.  The mucosal surfaces were surveyed very carefully during advancement of the scope and upon withdrawal.  Retroflexion view of the proximal stomach and esophagogastric junction was performed.      FINDINGS:  Normal-appearing esophagus. Stomach empty. Scattered antral and body erosions.    In the antrum, there was a 3 x 4 cm submucosal mass versus mass effect. Please see above photographs. The remainder of the gastric mucosa appeared normal. No obvious infiltrating process otherwise. Patent pylorus. Normal first and second portion of the duodenum.  THERAPEUTIC / DIAGNOSTIC MANEUVERS PERFORMED:  A 56 French Maloney dilator was passed to full insertion. This was done easily. A look back revealed no apparent complication related to this maneuver. Biopsies of the abnormal gastric mucosa were taken for histologic study   COMPLICATIONS:  None  IMPRESSION:    Normal esophagus-status post passage of a Maloney dilator. Gastric erosions  -  status post gastric biopsy. Submucosal gastric mass versus extrinsic mass effect.  RECOMMENDATIONS:  Followup on pathology. Proceed with abdominal and pelvic CT scan    _______________________________ R. Garfield Cornea, MD FACP [facility] eSigned:  R. Garfield Cornea, MD FACP [facility] 08/16/2013 2:21 PM     CC:

## 2013-08-16 NOTE — Interval H&P Note (Signed)
History and Physical Interval Note:  08/16/2013 1:54 PM  [name]  has presented today for surgery, with the diagnosis of NAUSEA, VOMITING, DYSPHAGIA  The various methods of treatment have been discussed with the patient and family. After consideration of risks, benefits and other options for treatment, the patient has consented to  Procedure(s) with comments: COLONOSCOPY (N/A) - 1:00 ESOPHAGOGASTRODUODENOSCOPY (EGD) (N/A) SAVORY DILATION (N/A) MALONEY DILATION (N/A) as a surgical intervention .  The patient's history has been reviewed, patient examined, no change in status, stable for surgery.  I have reviewed the patient's chart and labs.  Questions were answered to the patient's satisfaction.     [name]  No change in symptoms with Zofran and Protonix. EGD with esophageal  dilation and colonoscopy as appropriate.  The risks, benefits, limitations, alternatives and imponderables have been reviewed with the patient. Questions have been answered. All parties are agreeable.

## 2013-08-16 NOTE — Discharge Instructions (Signed)
Colonoscopy Discharge Instructions  Read the instructions outlined below and refer to this sheet in the next few weeks. These discharge instructions provide you with general information on caring for yourself after you leave the hospital. Your doctor may also give you specific instructions. While your treatment has been planned according to the most current medical practices available, unavoidable complications occasionally occur. If you have any problems or questions after discharge, call Dr. [name] at [phone]. ACTIVITY  You may resume your regular activity, but move at a slower pace for the next 24 hours.   Take frequent rest periods for the next 24 hours.   Walking will help get rid of the air and reduce the bloated feeling in your belly (abdomen).   No driving for 24 hours (because of the medicine (anesthesia) used during the test).    Do not sign any important legal documents or operate any machinery for 24 hours (because of the anesthesia used during the test).  NUTRITION  Drink plenty of fluids.   You may resume your normal diet as instructed by your doctor.   Begin with a light meal and progress to your normal diet. Heavy or fried foods are harder to digest and may make you feel sick to your stomach (nauseated).   Avoid alcoholic beverages for 24 hours or as instructed.  MEDICATIONS  You may resume your normal medications unless your doctor tells you otherwise.  WHAT YOU CAN EXPECT TODAY  Some feelings of bloating in the abdomen.   Passage of more gas than usual.   Spotting of blood in your stool or on the toilet paper.  IF YOU HAD POLYPS REMOVED DURING THE COLONOSCOPY:  No aspirin products for 7 days or as instructed.   No alcohol for 7 days or as instructed.   Eat a soft diet for the next 24 hours.  FINDING OUT THE RESULTS OF YOUR TEST Not all test results are available during your visit. If your test results are not back during the visit, make an appointment  with your caregiver to find out the results. Do not assume everything is normal if you have not heard from your caregiver or the medical facility. It is important for you to follow up on all of your test results.  SEEK IMMEDIATE MEDICAL ATTENTION IF:  You have more than a spotting of blood in your stool.   Your belly is swollen (abdominal distention).   You are nauseated or vomiting.   You have a temperature over 101.  You have abdominal pain or discomfort that is severe or gets worse throughout the day. EGD Discharge instructions Please read the instructions outlined below and refer to this sheet in the next few weeks. These discharge instructions provide you with general information on caring for yourself after you leave the hospital. Your doctor may also give you specific instructions. While your treatment has been planned according to the most current medical practices available, unavoidable complications occasionally occur. If you have any problems or questions after discharge, please call your doctor. ACTIVITY You may resume your regular activity but move at a slower pace for the next 24 hours.  Take frequent rest periods for the next 24 hours.  Walking will help expel (get rid of) the air and reduce the bloated feeling in your abdomen.  No driving for 24 hours (because of the anesthesia (medicine) used during the test).  You may shower.  Do not sign any important legal documents or operate any machinery for 24  hours (because of the anesthesia used during the test).  NUTRITION Drink plenty of fluids.  You may resume your normal diet.  Begin with a light meal and progress to your normal diet.  Avoid alcoholic beverages for 24 hours or as instructed by your caregiver.  MEDICATIONS You may resume your normal medications unless your caregiver tells you otherwise.  WHAT YOU CAN EXPECT TODAY You may experience abdominal discomfort such as a feeling of fullness or gas pains.   FOLLOW-UP Your doctor will discuss the results of your test with you.  SEEK IMMEDIATE MEDICAL ATTENTION IF ANY OF THE FOLLOWING OCCUR: Excessive nausea (feeling sick to your stomach) and/or vomiting.  Severe abdominal pain and distention (swelling).  Trouble swallowing.  Temperature over 101 F (37.8 C).  Rectal bleeding or vomiting of blood.    Schedule contrast  CT of the abdomen and pelvis to further evaluate extrinsic compression on the stomach seen today  Colonoscopy incomplete because of poor preparation. Multiple polyps found and removed.  Will need a repeat colonoscopy later in the year.  Further recommendations to follow pending review of pathology report

## 2013-08-16 NOTE — Op Note (Signed)
[facility] [address] [place], [address]   COLONOSCOPY PROCEDURE REPORT  PATIENT: [name], [name]  MR#:         [id number] BIRTHDATE: 09/20/1931 , 82  yrs. old GENDER: Male ENDOSCOPIST: R.  Garfield Cornea, MD Quentin Ore REFERRED BY:     Runell Gess, PA-C PROCEDURE DATE:  08/16/2013 PROCEDURE:     Colonoscopy with biopsy and snare polypectomy  INDICATIONS: Distant history of colonic adenoma; surveillance examination  INFORMED CONSENT:  The risks, benefits, alternatives and imponderables including but not limited to bleeding, perforation as well as the possibility of a missed lesion have been reviewed.  The potential for biopsy, lesion removal, etc. have also been discussed.  Questions have been answered.  All parties agreeable. Please see the history and physical in the medical record for more information.  MEDICATIONS: Versed 4 mg IV and Demerol 50 mg IV in divided doses.  DESCRIPTION OF PROCEDURE:  After a digital rectal exam was performed, the EC-[id number] ([id number])  colonoscope was advanced from the anus through the rectum and colon to the area of the cecum, ileocecal valve and appendiceal orifice.  The cecum was deeply intubated.  These structures were seen and photographed for the record.  From the level of the cecum and ileocecal valve, the scope was slowly and cautiously withdrawn.  The mucosal surfaces were carefully surveyed utilizing scope tip deflection to facilitate fold flattening as needed.  The scope was pulled down into the rectum where a thorough examination including retroflexion was performed.    FINDINGS:  Inadequate preparation as far as polyp/lesion is concerned.  Grossly normal rectum. Somewhat redundant colon. Multiple colonic polyps in the cecum Ascending and hepatic flexure segments. The largest polyp was seen at the hepatic flexure( approximately 7 mm x1.1 cm);  Other polyps may have been present but were obscured by the poor  prep particularly on the right side. There was a 5 mm area of hyperpigmentation in the mid descending segment. Otherwise, the colonic mucosa that was seen, appeared normal. This was an incomplete examination because of the prep.  THERAPEUTIC / DIAGNOSTIC MANEUVERS PERFORMED:  multiple hot snare polypectomies and cold biopsy/removals  were performed.  COMPLICATIONS: none  CECAL WITHDRAWAL TIME:  11 minutes  IMPRESSION:  Incomplete colonoscopy because of inadequate preparation. Multiple colonic polyps.Status post multiple biopsies and snare polypectomies  RECOMMENDATIONS: Follow up on pathology. Patient will need early followup colonoscopy. See EGD report.   _______________________________ eSigned:  R. Garfield Cornea, MD FACP [facility] 08/16/2013 2:48 PM   CC:    PATIENT NAME:  [name], [name] MR#: [id number]

## 2013-08-16 NOTE — H&P (View-Only) (Signed)
Primary Care Physician:  Tarri Fuller, PA-C Primary Gastroenterologist:  Dr. [name]   Chief Complaint  Patient presents with  . Nausea    x 3 months  . Emesis    HPI:   [name] presents today at the request of his PCP secondary to chronic nausea and vomiting. Last colonoscopy in 2004 with 3 polyps, path unknown. Needs surveillance. Nausea all the time. Intermittent vomiting. No abdominal pain. Rare diarrhea. Last episode about a month ago. No melena. No hematochezia. Wakes up and doesn't want to eat. Has to eat a little something to "keep going". States 2 months ago weighed 230, now 211. Loss of appetite. Has bitter taste in mouth a lot. No PPI on med list. Food sometimes feels like it doesn't "go down as good as it ought to". No odynophagia. Was taking Aleve chronically but stopped several weeks ago.   Past Medical History  Diagnosis Date  . Diabetes mellitus without complication   . Hypertension   . BPH (benign prostatic hyperplasia)   . CAD (coronary artery disease)     Past Surgical History  Procedure Laterality Date  . Cardiac surgery    . Coronary artery bypass graft    . Cystoscopy N/A 12/12/2012    Procedure: CYSTOSCOPY FLEXIBLE;  Surgeon: Marissa Nestle, MD;  Location: [facility];  Service: Urology;  Laterality: N/A;  . Colonoscopy  2004    Dr. Laural Golden: three small polyps at cecum, path unknown, external hemorrhoids    Current Outpatient Prescriptions  Medication Sig Dispense Refill  . amLODipine-olmesartan (AZOR) 10-40 MG per tablet Take 1 tablet by mouth daily.      [name] aspirin 325 MG EC tablet Take 325 mg by mouth daily.      . insulin detemir (LEVEMIR) 100 UNIT/ML injection Inject 20 Units into the skin at bedtime.       . Insulin Glargine (LANTUS SOLOSTAR) 100 UNIT/ML SOPN Inject 45 Units into the skin at bedtime.  5 pen  5  . rosuvastatin (CRESTOR) 5 MG tablet Take 5 mg by mouth daily.      . silodosin (RAPAFLO) 8 MG CAPS capsule Take 8 mg by mouth  daily with breakfast.      . ondansetron (ZOFRAN) 4 MG tablet Take 1 tablet (4 mg total) by mouth 3 (three) times daily with meals.  90 tablet  1  . pantoprazole (PROTONIX) 40 MG tablet Take 1 tablet (40 mg total) by mouth daily. Take 30 minutes prior to breakfast daily.  30 tablet  3   No current facility-administered medications for this visit.    Allergies as of 08/01/2013  . (No Known Allergies)    Family History  Problem Relation Age of Onset  . Colon cancer Son 19    deceased    History   Social History  . Marital Status: Single    Spouse Name: N/A    Number of Children: N/A  . Years of Education: N/A   Occupational History  . Not on file.   Social History Main Topics  . Smoking status: Never Smoker   . Smokeless tobacco: Not on file  . Alcohol Use: No  . Drug Use: No  . Sexual Activity: Not on file   Other Topics Concern  . Not on file   Social History Narrative   ** Merged History Encounter **        Review of Systems: Gen: see HPI CV: Denies chest pain, heart palpitations,  peripheral edema, syncope.  Resp: Denies shortness of breath at rest or with exertion. Denies wheezing or cough.  GI: see HPI GU : Denies urinary burning, urinary frequency, urinary hesitancy MS: back pain Derm: Denies rash, itching, dry skin Psych: Denies depression, anxiety, memory loss, and confusion Heme: Denies bruising, bleeding, and enlarged lymph nodes.  Physical Exam: BP 141/71  Pulse 74  Temp(Src) 98 F (36.7 C) (Oral)  Ht 5' 9.5" (1.765 m)  Wt 211 lb 9.6 oz (95.981 kg)  BMI 30.81 kg/m2 General:   Alert and oriented. Pleasant and cooperative. Well-nourished and well-developed.  Head:  Normocephalic and atraumatic. Eyes:  Without icterus, sclera clear and conjunctiva pink.  Ears:  Normal auditory acuity. Nose:  No deformity, discharge,  or lesions. Mouth:  No deformity or lesions, oral mucosa pink.  Lungs:  Clear to auscultation bilaterally. No wheezes, rales, or  rhonchi. No distress.  Heart:  S1, S2 present without murmurs appreciated.  Abdomen:  +BS, soft, non-tender and non-distended. No HSM noted. Possible umbilical hernia Rectal:  Deferred  Msk:  Symmetrical without gross deformities. Normal posture. Extremities:  Without  edema. Neurologic:  Alert and  oriented x4;  grossly normal neurologically. Skin:  Intact without significant lesions or rashes. Psych:  Alert and cooperative. Normal mood and affect.

## 2013-08-17 ENCOUNTER — Other Ambulatory Visit (HOSPITAL_COMMUNITY): Payer: Self-pay

## 2013-08-17 ENCOUNTER — Ambulatory Visit (HOSPITAL_COMMUNITY)
Admission: RE | Admit: 2013-08-17 | Discharge: 2013-08-17 | Disposition: A | Discharge status: Home / Self Care | Payer: PRIVATE HEALTH INSURANCE | Source: Ambulatory Visit | Attending: Internal Medicine | Admitting: Internal Medicine

## 2013-08-17 ENCOUNTER — Encounter (HOSPITAL_COMMUNITY): Payer: Self-pay

## 2013-08-17 DIAGNOSIS — R110 Nausea: Secondary | ICD-10-CM | POA: Insufficient documentation

## 2013-08-17 DIAGNOSIS — K319 Disease of stomach and duodenum, unspecified: Secondary | ICD-10-CM | POA: Insufficient documentation

## 2013-08-17 DIAGNOSIS — R1310 Dysphagia, unspecified: Secondary | ICD-10-CM | POA: Insufficient documentation

## 2013-08-17 LAB — GLUCOSE, CAPILLARY: GLUCOSE-CAPILLARY: 220 mg/dL — AB (ref 70–99)

## 2013-08-17 MED ORDER — SODIUM CHLORIDE 0.9 % IJ SOLN
INTRAMUSCULAR | Status: AC
  Filled 2013-08-17: qty 500

## 2013-08-17 MED ORDER — SODIUM CHLORIDE 0.9 % IJ SOLN
INTRAMUSCULAR | Status: AC
  Filled 2013-08-17: qty 30

## 2013-08-17 MED ORDER — IOHEXOL 300 MG/ML  SOLN
100.0000 mL | Freq: Once | INTRAMUSCULAR | Status: AC | PRN
  Administered 2013-08-17: 100 mL via INTRAVENOUS

## 2013-08-21 ENCOUNTER — Telehealth: Payer: Self-pay | Admitting: Internal Medicine

## 2013-08-21 NOTE — Telephone Encounter (Signed)
I feel this submucosal gastric lesion needs further evaluation via EUS. Let's move towards referral to Dr. Ardis Hughs for consideration of EUS. Please let patient know mass associated with stomach appears somewhat nonspecific on CT (as well as EGD). I feel an endoscopic ultrasound is needed to further evaluate. Will let Dr. Ardis Hughs take a look and decide.          Attached to     [facility] (Order# [id number])               CT Abdomen Pelvis W Contrast Status: Final result         PACS Images    Show images for CT Abdomen Pelvis W Contrast         Study Result    CLINICAL DATA: Nausea, dysphagia, extrinsic compression of stomach  on EGD  EXAM:  CT ABDOMEN AND PELVIS WITH CONTRAST  TECHNIQUE:  Multidetector CT imaging of the abdomen and pelvis was performed  using the standard protocol following bolus administration of  intravenous contrast.  CONTRAST: 176mL OMNIPAQUE IOHEXOL 300 MG/ML SOLN  COMPARISON: None.  FINDINGS:  Lung bases are clear.  2.3 x 3.3 x 1.4 cm fatty lesion along the superior aspect of the  distal gastric antrum, which appears intraluminal on axial imaging  but is eccentric and possibly submucosal on coronal imaging (series  3/image 32), and likely corresponds to the submucosal lesion on  endoscopy.  Liver, pancreas, and adrenal glands are within normal limits.  Pericapsular calcifications along the lateral spleen (series 2/  image 19).  Layering gallstones (series 2/image 33). No associated inflammatory  changes. No intrahepatic or extrahepatic ductal dilatation.  Kidneys are within normal limits. No hydronephrosis.  No evidence of bowel obstruction. Normal appendix.  Atherosclerotic calcifications of the abdominal aorta and branch  vessels.  No abdominopelvic ascites.  Small retroperitoneal lymph nodes which do not meet pathologic CT  size criteria.  Prostatomegaly, with enlargement of the central gland which indents  the  base of the bladder.  Bladder is within normal limits.  Degenerative changes of the visualized thoracolumbar spine. Mild  superior endplate changes at L1 (sagittal image 70).  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  Electronically Signed  By: Julian Hy M.D.  On: 08/17/2013 16:03          Result Notes    Notes Recorded by Marliss Czar A Lovelace on 08/21/2013 at 8:56 AM Referral has been sent to [facility] w/Dr. Ardis Hughs ------  Notes Recorded by Anell Barr, LPN on 5/73/2202 at 5:42 AM Pt returned call before I could get the letter done. I informed him of Dr. Roseanne Kaufman recommendations. Routing to Darius Bump to schedule. ------  Notes Recorded by Anell Barr, LPN on 07/11/2374 at 2:83 AM Tried to call pt and VM not set up. Will mail letter for pt to call as soon as he gets the letter. ------  Notes Recorded by Daneil Dolin, MD on 08/18/2013 at 1:28 PM I feel this submucosal gastric lesion needs further evaluation via EUS. Let's move towards referral to Dr. Ardis Hughs for consideration of EUS. Please let patient know mass associated with stomach appears somewhat nonspecific on CT (as well as EGD). I feel an endoscopic ultrasound is needed to further evaluate. Will let Dr. Ardis Hughs take a look and decide.  External Result Report    External Result Report            Imaging    Imaging Information            Signed by    Signed Date/Time   Phone Pager   Julian Hy 08/17/2013 4:03 PM [phone]          Exam Information    Status Exam Begun   Exam Ended     Final [99] 08/17/2013 2:47 PM 08/17/2013 3:06 PM         Result Notes    Notes Recorded by Marliss Czar A Lovelace on 08/21/2013 at 8:56 AM Referral has been sent to [facility] w/Dr. Ardis Hughs ------  Notes Recorded by Anell Barr, LPN on  5/78/4696 at 8:46 AM Pt returned call before I could get the letter done. I informed him of Dr. Roseanne Kaufman recommendations. Routing to Darius Bump to schedule. ------  Notes Recorded by Anell Barr, LPN on 2/95/2841 at 3:24 AM Tried to call pt and VM not set up. Will mail letter for pt to call as soon as he gets the letter. ------  Notes Recorded by Daneil Dolin, MD on 08/18/2013 at 1:28 PM I feel this submucosal gastric lesion needs further evaluation via EUS. Let's move towards referral to Dr. Ardis Hughs for consideration of EUS. Please let patient know mass associated with stomach appears somewhat nonspecific on CT (as well as EGD). I feel an endoscopic ultrasound is needed to further evaluate. Will let Dr. Ardis Hughs take a look and decide.                    Imaging Related Medications    Medication   iohexol (OMNIPAQUE) 300 MG/ML solution 100 mL   Route: Intravenous   Admin Dose: 100 mL   Volume: 100 mL   PRN Reason(s): contrast   Last Admin Time: 08/17/13 1452   Number of Doses: 1       Most Recent Administration:     User Action Time Recorded Time Dose Route Site Comment Action Reason    Sammuel Bailiff 08/17/13 1452 08/17/13 1452 100 mL Intravenous   Contrast Given     Full Administration Report                     Original Order    Ordered On Ordered By     Wed Aug 16, 2013 4:12 PM [place]

## 2013-08-21 NOTE — Progress Notes (Signed)
Quick Note:  Tried to call pt and VM not set up. Will mail letter for pt to call as soon as he gets the letter. ______

## 2013-08-21 NOTE — Progress Notes (Signed)
Quick Note:  Pt returned call before I could get the letter done. I informed him of Dr. Roseanne Kaufman recommendations. Routing to Darius Bump to schedule. ______

## 2013-08-22 ENCOUNTER — Other Ambulatory Visit (HOSPITAL_COMMUNITY): Payer: Self-pay | Admitting: Family Medicine

## 2013-08-22 ENCOUNTER — Telehealth: Payer: Self-pay

## 2013-08-22 ENCOUNTER — Other Ambulatory Visit: Payer: Self-pay

## 2013-08-22 DIAGNOSIS — K319 Disease of stomach and duodenum, unspecified: Secondary | ICD-10-CM

## 2013-08-22 DIAGNOSIS — M545 Low back pain: Secondary | ICD-10-CM

## 2013-08-22 NOTE — Telephone Encounter (Signed)
EUS scheduled, pt instructed and medications reviewed.  Patient instructions mailed to home.  Patient to call with any questions or concerns.  

## 2013-08-22 NOTE — Telephone Encounter (Signed)
Message copied by Barron Alvine on Tue Aug 22, 2013  1:28 PM ------      Message from: Milus Banister      Created: Tue Aug 22, 2013  1:24 PM      Regarding: RE: EUS       Ok,      He needs upper eus, radial +/- linear, next available EUS Thursday ++MAC.            Thanks            ----- Message -----         From: Barron Alvine, CMA         Sent: 08/22/2013   8:23 AM           To: Milus Banister, MD      Subject: FW: EUS                                                              ----- Message -----         From: Michelene Gardener Lovelace         Sent: 08/21/2013   8:55 AM           To: Barron Alvine, CMA      Subject: EUS                                                      I feel this submucosal gastric lesion needs further evaluation via EUS. Let's move towards referral to Dr. Ardis Hughs for consideration of EUS. Please let patient know mass associated with stomach appears somewhat nonspecific on CT (as well as EGD). I feel an endoscopic ultrasound is needed to further evaluate. Will let Dr. Ardis Hughs take a look and decide       ------

## 2013-08-23 ENCOUNTER — Encounter (HOSPITAL_COMMUNITY): Payer: Self-pay | Admitting: Pharmacy Technician

## 2013-08-24 ENCOUNTER — Ambulatory Visit (HOSPITAL_COMMUNITY)
Admission: RE | Admit: 2013-08-24 | Discharge: 2013-08-24 | Disposition: A | Discharge status: Home / Self Care | Payer: PRIVATE HEALTH INSURANCE | Source: Ambulatory Visit | Attending: Family Medicine | Admitting: Family Medicine

## 2013-08-24 DIAGNOSIS — M545 Low back pain: Secondary | ICD-10-CM

## 2013-08-25 ENCOUNTER — Encounter (HOSPITAL_COMMUNITY): Payer: Self-pay | Admitting: Internal Medicine

## 2013-08-25 ENCOUNTER — Encounter: Payer: Self-pay | Admitting: Internal Medicine

## 2013-08-30 ENCOUNTER — Encounter (HOSPITAL_COMMUNITY): Payer: Self-pay | Admitting: Pharmacy Technician

## 2013-08-30 ENCOUNTER — Encounter (HOSPITAL_COMMUNITY): Payer: Self-pay | Admitting: *Deleted

## 2013-09-07 ENCOUNTER — Encounter (HOSPITAL_COMMUNITY): Payer: PRIVATE HEALTH INSURANCE | Admitting: Anesthesiology

## 2013-09-07 ENCOUNTER — Encounter (HOSPITAL_COMMUNITY): Payer: Self-pay | Admitting: Anesthesiology

## 2013-09-07 ENCOUNTER — Ambulatory Visit (HOSPITAL_COMMUNITY): Payer: PRIVATE HEALTH INSURANCE | Admitting: Anesthesiology

## 2013-09-07 ENCOUNTER — Ambulatory Visit (HOSPITAL_COMMUNITY)
Admission: RE | Admit: 2013-09-07 | Discharge: 2013-09-07 | Disposition: A | Discharge status: Home / Self Care | Payer: PRIVATE HEALTH INSURANCE | Source: Ambulatory Visit | Attending: Gastroenterology | Admitting: Gastroenterology

## 2013-09-07 ENCOUNTER — Encounter (HOSPITAL_COMMUNITY)
Admission: RE | Disposition: A | Discharge status: Home / Self Care | Payer: Self-pay | Source: Ambulatory Visit | Attending: Gastroenterology

## 2013-09-07 DIAGNOSIS — I2510 Atherosclerotic heart disease of native coronary artery without angina pectoris: Secondary | ICD-10-CM | POA: Diagnosis not present

## 2013-09-07 DIAGNOSIS — K219 Gastro-esophageal reflux disease without esophagitis: Secondary | ICD-10-CM | POA: Insufficient documentation

## 2013-09-07 DIAGNOSIS — E7800 Pure hypercholesterolemia, unspecified: Secondary | ICD-10-CM | POA: Insufficient documentation

## 2013-09-07 DIAGNOSIS — N400 Enlarged prostate without lower urinary tract symptoms: Secondary | ICD-10-CM | POA: Insufficient documentation

## 2013-09-07 DIAGNOSIS — E119 Type 2 diabetes mellitus without complications: Secondary | ICD-10-CM | POA: Diagnosis not present

## 2013-09-07 DIAGNOSIS — I10 Essential (primary) hypertension: Secondary | ICD-10-CM | POA: Insufficient documentation

## 2013-09-07 DIAGNOSIS — K319 Disease of stomach and duodenum, unspecified: Secondary | ICD-10-CM

## 2013-09-07 DIAGNOSIS — K3189 Other diseases of stomach and duodenum: Secondary | ICD-10-CM

## 2013-09-07 DIAGNOSIS — Z87891 Personal history of nicotine dependence: Secondary | ICD-10-CM | POA: Insufficient documentation

## 2013-09-07 HISTORY — PX: EUS: SHX5427

## 2013-09-07 LAB — GLUCOSE, CAPILLARY: Glucose-Capillary: 238 mg/dL — ABNORMAL HIGH (ref 70–99)

## 2013-09-07 SURGERY — UPPER ENDOSCOPIC ULTRASOUND (EUS) LINEAR
Anesthesia: Monitor Anesthesia Care

## 2013-09-07 MED ORDER — LACTATED RINGERS IV SOLN
INTRAVENOUS | Status: DC | PRN
  Administered 2013-09-07: 11:00:00 via INTRAVENOUS

## 2013-09-07 MED ORDER — PROPOFOL 10 MG/ML IV BOLUS
INTRAVENOUS | Status: DC | PRN
  Administered 2013-09-07: 50 mg via INTRAVENOUS
  Administered 2013-09-07: 100 mg via INTRAVENOUS
  Administered 2013-09-07: 50 mg via INTRAVENOUS
  Administered 2013-09-07 (×2): 25 mg via INTRAVENOUS
  Administered 2013-09-07: 50 mg via INTRAVENOUS
  Administered 2013-09-07: 25 mg via INTRAVENOUS

## 2013-09-07 MED ORDER — PROPOFOL 10 MG/ML IV BOLUS
INTRAVENOUS | Status: AC
  Filled 2013-09-07: qty 20

## 2013-09-07 NOTE — Op Note (Signed)
[facility] [place], [address]   ENDOSCOPIC ULTRASOUND PROCEDURE REPORT  PATIENT: [name], [name]  MR#: [id number] BIRTHDATE: [birth year]  GENDER: Male ENDOSCOPIST: Milus Banister, MD REFERRED BY:  Garfield Cornea, M.D. PROCEDURE DATE:  09/07/2013 PROCEDURE:   Upper EUS w/FNA ASA CLASS:      Class III INDICATIONS:   abdominal pain, mild weight loss led to EGD Dr. [name] last month, submucosal lesion noted.  By CT appeared 3.3cm, ? lipoma. MEDICATIONS: MAC sedation, administered by CRNA  DESCRIPTION OF PROCEDURE:   After the risks benefits and alternatives of the procedure were  explained, informed consent was obtained. The patient was then placed in the left, lateral, decubitus postion and IV sedation was administered. Throughout the procedure, the patients blood pressure, pulse and oxygen saturations were monitored continuously.  Under direct visualization, the Pentax EUS Linear [id number]  endoscope was introduced through the mouth  and advanced to the second portion of the duodenum .  Water was used as necessary to provide an acoustic interface.  Upon completion of the imaging, water was removed and the patient was sent to the recovery room in satisfactory condition.   Endoscopic findings : 1. Soft submucosal lesion in distal stomach, approximately 2-3cm across endoscopically.  Normal overlying mucosa. 2. Otherwise normal UGI tract  EUS findings: 1. The lesion above corresponded with a hyerechoic, well circumscribed mass within the wall of the distal stomach. The mass measured 3.0 by 2.4cm. The mass was sampled with 2 transgastric EUS FNA passes with a 25 gauge FNA needle. This yielded a few small drops of yellowish liquid. 2. The gastric wall was otherwise normal. 3. Limited views of liver, spleen, pancreas were all normal.  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  Await final cytology report for final  recommendations.   _______________________________ eSignedMilus Banister, MD 09/07/2013 12:14 PM

## 2013-09-07 NOTE — Discharge Instructions (Signed)

## 2013-09-07 NOTE — Transfer of Care (Signed)
Immediate Anesthesia Transfer of Care Note  Patient: [name]  Procedure(s) Performed: Procedure(s): UPPER ENDOSCOPIC ULTRASOUND (EUS) LINEAR (N/A)  Patient Location: PACU  Anesthesia Type:MAC  Level of Consciousness: awake, sedated and patient cooperative  Airway & Oxygen Therapy: Patient Spontanous Breathing and Patient connected to face mask oxygen  Post-op Assessment: Report given to PACU RN and Post -op Vital signs reviewed and stable  Post vital signs: Reviewed and stable  Complications: No apparent anesthesia complications

## 2013-09-07 NOTE — H&P (Signed)
HPI: This is an 80 yo man found to have gastric mass recently on EGD.  CT scan followup confirmed 3cm lipomatous appearing mass.     Past Medical History  Diagnosis Date  . Diabetes mellitus without complication   . Hypertension   . BPH (benign prostatic hyperplasia)   . CAD (coronary artery disease)   . High cholesterol   . GERD (gastroesophageal reflux disease)     Past Surgical History  Procedure Laterality Date  . Cardiac surgery    . Cystoscopy N/A 12/12/2012    Procedure: CYSTOSCOPY FLEXIBLE;  Surgeon: Marissa Nestle, MD;  Location: [facility];  Service: Urology;  Laterality: N/A;  . Colonoscopy  2004    Dr. Laural Golden: three small polyps at cecum, path unknown, external hemorrhoids  . Coronary artery bypass graft      x5  . Cardiac catheterization      stents placed  . Colonoscopy N/A 08/16/2013    Procedure: COLONOSCOPY;  Surgeon: Daneil Dolin, MD;  Location: [facility];  Service: Endoscopy;  Laterality: N/A;  1:00  . Esophagogastroduodenoscopy N/A 08/16/2013    Procedure: ESOPHAGOGASTRODUODENOSCOPY (EGD);  Surgeon: Daneil Dolin, MD;  Location: [facility];  Service: Endoscopy;  Laterality: N/A;  Venia Minks dilation N/A 08/16/2013    Procedure: Venia Minks DILATION;  Surgeon: Daneil Dolin, MD;  Location: [facility];  Service: Endoscopy;  Laterality: N/A;    No current facility-administered medications for this encounter.    Allergies as of 08/22/2013  . (No Known Allergies)    Family History  Problem Relation Age of Onset  . Colon cancer Son 28    deceased    History   Social History  . Marital Status: Single    Spouse Name: N/A    Number of Children: N/A  . Years of Education: N/A   Occupational History  . Not on file.   Social History Main Topics  . Smoking status: Former Smoker -- 1.00 packs/day for 11 years    Quit date: 01/05/1956  . Smokeless tobacco: Not on file  . Alcohol Use: No  . Drug Use: No  . Sexual Activity: Not on file   Other  Topics Concern  . Not on file   Social History Narrative   ** Merged History Encounter **          Physical Exam: BP 213/83  Pulse 72  Temp(Src) 97.8 F (36.6 C) (Oral)  Resp 15  Ht 5\' 9"  (1.753 m)  SpO2 99% Constitutional: generally well-appearing Psychiatric: alert and oriented x3 Abdomen: soft, nontender, nondistended, no obvious ascites, no peritoneal signs, normal bowel sounds     Assessment and plan: 80 y.o. male with gastric mass on EGD recently   For EUS evaluation

## 2013-09-07 NOTE — Anesthesia Postprocedure Evaluation (Signed)
  Anesthesia Post-op Note  Patient: [name]  Procedure(s) Performed: Procedure(s) (LRB): UPPER ENDOSCOPIC ULTRASOUND (EUS) LINEAR (N/A)  Patient Location: PACU  Anesthesia Type: MAC  Level of Consciousness: awake and alert   Airway and Oxygen Therapy: Patient Spontanous Breathing  Post-op Pain: mild  Post-op Assessment: Post-op Vital signs reviewed, Patient's Cardiovascular Status Stable, Respiratory Function Stable, Patent Airway and No signs of Nausea or vomiting  Last Vitals:  Filed Vitals:   09/07/13 1246  BP: 195/85  Pulse: 62  Temp:   Resp: 11    Post-op Vital Signs: stable   Complications: No apparent anesthesia complications

## 2013-09-07 NOTE — Anesthesia Preprocedure Evaluation (Addendum)
Anesthesia Evaluation  Patient identified by MRN, date of birth, ID band Patient awake    Reviewed: Allergy & Precautions, H&P , NPO status , Patient's Chart, lab work & pertinent test results  Airway Mallampati: II TM Distance: >3 FB Neck ROM: Full    Dental  (+) Edentulous Upper, Edentulous Lower   Pulmonary shortness of breath, former smoker,  breath sounds clear to auscultation  Pulmonary exam normal       Cardiovascular hypertension, Pt. on medications + CAD negative cardio ROS  Rhythm:Regular Rate:Normal     Neuro/Psych negative neurological ROS  negative psych ROS   GI/Hepatic Neg liver ROS, GERD-  Medicated,  Endo/Other  diabetes  Renal/GU negative Renal ROS  negative genitourinary   Musculoskeletal negative musculoskeletal ROS (+)   Abdominal   Peds negative pediatric ROS (+)  Hematology negative hematology ROS (+)   Anesthesia Other Findings   Reproductive/Obstetrics negative OB ROS                          Anesthesia Physical Anesthesia Plan  ASA: III  Anesthesia Plan: MAC   Post-op Pain Management:    Induction: Intravenous  Airway Management Planned:   Additional Equipment:   Intra-op Plan:   Post-operative Plan:   Informed Consent: I have reviewed the patients History and Physical, chart, labs and discussed the procedure including the risks, benefits and alternatives for the proposed anesthesia with the patient or authorized representative who has indicated his/her understanding and acceptance.   Dental advisory given  Plan Discussed with: CRNA  Anesthesia Plan Comments:         Anesthesia Quick Evaluation

## 2013-09-08 ENCOUNTER — Encounter (HOSPITAL_COMMUNITY): Payer: Self-pay | Admitting: Gastroenterology

## 2013-09-12 NOTE — Progress Notes (Signed)
A followup CT on your end in one year sounds good to me.  Thanks

## 2013-09-17 ENCOUNTER — Ambulatory Visit: Admission: RE | Admit: 2013-09-17 | Payer: PRIVATE HEALTH INSURANCE | Source: Ambulatory Visit

## 2013-12-18 ENCOUNTER — Other Ambulatory Visit: Payer: Self-pay

## 2013-12-18 ENCOUNTER — Emergency Department (HOSPITAL_COMMUNITY): Payer: PRIVATE HEALTH INSURANCE

## 2013-12-18 ENCOUNTER — Inpatient Hospital Stay (HOSPITAL_COMMUNITY)
Admission: EM | Admit: 2013-12-18 | Discharge: 2013-12-22 | DRG: 603 | Disposition: A | Discharge status: Home / Self Care | Payer: PRIVATE HEALTH INSURANCE | Attending: Family Medicine | Admitting: Family Medicine

## 2013-12-18 ENCOUNTER — Encounter (HOSPITAL_COMMUNITY): Payer: Self-pay | Admitting: Emergency Medicine

## 2013-12-18 DIAGNOSIS — Z23 Encounter for immunization: Secondary | ICD-10-CM

## 2013-12-18 DIAGNOSIS — E780 Pure hypercholesterolemia: Secondary | ICD-10-CM | POA: Diagnosis present

## 2013-12-18 DIAGNOSIS — B370 Candidal stomatitis: Secondary | ICD-10-CM | POA: Diagnosis present

## 2013-12-18 DIAGNOSIS — R1310 Dysphagia, unspecified: Secondary | ICD-10-CM | POA: Diagnosis present

## 2013-12-18 DIAGNOSIS — K228 Other specified diseases of esophagus: Secondary | ICD-10-CM | POA: Diagnosis present

## 2013-12-18 DIAGNOSIS — R0989 Other specified symptoms and signs involving the circulatory and respiratory systems: Secondary | ICD-10-CM | POA: Diagnosis present

## 2013-12-18 DIAGNOSIS — Z794 Long term (current) use of insulin: Secondary | ICD-10-CM

## 2013-12-18 DIAGNOSIS — R1110 Vomiting, unspecified: Secondary | ICD-10-CM

## 2013-12-18 DIAGNOSIS — Z7982 Long term (current) use of aspirin: Secondary | ICD-10-CM | POA: Diagnosis not present

## 2013-12-18 DIAGNOSIS — Z951 Presence of aortocoronary bypass graft: Secondary | ICD-10-CM

## 2013-12-18 DIAGNOSIS — R112 Nausea with vomiting, unspecified: Secondary | ICD-10-CM | POA: Diagnosis present

## 2013-12-18 DIAGNOSIS — G8929 Other chronic pain: Secondary | ICD-10-CM | POA: Diagnosis present

## 2013-12-18 DIAGNOSIS — I10 Essential (primary) hypertension: Secondary | ICD-10-CM | POA: Diagnosis present

## 2013-12-18 DIAGNOSIS — K224 Dyskinesia of esophagus: Secondary | ICD-10-CM | POA: Diagnosis present

## 2013-12-18 DIAGNOSIS — R609 Edema, unspecified: Secondary | ICD-10-CM

## 2013-12-18 DIAGNOSIS — L03221 Cellulitis of neck: Secondary | ICD-10-CM | POA: Diagnosis present

## 2013-12-18 DIAGNOSIS — Z800 Family history of malignant neoplasm of digestive organs: Secondary | ICD-10-CM

## 2013-12-18 DIAGNOSIS — E119 Type 2 diabetes mellitus without complications: Secondary | ICD-10-CM

## 2013-12-18 DIAGNOSIS — L0211 Cutaneous abscess of neck: Secondary | ICD-10-CM

## 2013-12-18 DIAGNOSIS — M549 Dorsalgia, unspecified: Secondary | ICD-10-CM

## 2013-12-18 DIAGNOSIS — R1115 Cyclical vomiting syndrome unrelated to migraine: Secondary | ICD-10-CM

## 2013-12-18 DIAGNOSIS — Z79891 Long term (current) use of opiate analgesic: Secondary | ICD-10-CM | POA: Diagnosis not present

## 2013-12-18 DIAGNOSIS — Z6831 Body mass index (BMI) 31.0-31.9, adult: Secondary | ICD-10-CM

## 2013-12-18 DIAGNOSIS — N400 Enlarged prostate without lower urinary tract symptoms: Secondary | ICD-10-CM | POA: Diagnosis present

## 2013-12-18 DIAGNOSIS — K219 Gastro-esophageal reflux disease without esophagitis: Secondary | ICD-10-CM | POA: Diagnosis present

## 2013-12-18 DIAGNOSIS — E669 Obesity, unspecified: Secondary | ICD-10-CM | POA: Diagnosis present

## 2013-12-18 DIAGNOSIS — Z87891 Personal history of nicotine dependence: Secondary | ICD-10-CM | POA: Diagnosis not present

## 2013-12-18 DIAGNOSIS — K3189 Other diseases of stomach and duodenum: Secondary | ICD-10-CM

## 2013-12-18 DIAGNOSIS — I2510 Atherosclerotic heart disease of native coronary artery without angina pectoris: Secondary | ICD-10-CM | POA: Diagnosis present

## 2013-12-18 DIAGNOSIS — R52 Pain, unspecified: Secondary | ICD-10-CM

## 2013-12-18 DIAGNOSIS — R079 Chest pain, unspecified: Secondary | ICD-10-CM | POA: Diagnosis present

## 2013-12-18 DIAGNOSIS — L0291 Cutaneous abscess, unspecified: Secondary | ICD-10-CM | POA: Diagnosis present

## 2013-12-18 DIAGNOSIS — D72829 Elevated white blood cell count, unspecified: Secondary | ICD-10-CM | POA: Diagnosis present

## 2013-12-18 DIAGNOSIS — E0800 Diabetes mellitus due to underlying condition with hyperosmolarity without nonketotic hyperglycemic-hyperosmolar coma (NKHHC): Secondary | ICD-10-CM

## 2013-12-18 DIAGNOSIS — L0390 Cellulitis, unspecified: Secondary | ICD-10-CM | POA: Diagnosis present

## 2013-12-18 DIAGNOSIS — R634 Abnormal weight loss: Secondary | ICD-10-CM | POA: Insufficient documentation

## 2013-12-18 HISTORY — DX: Other diseases of stomach and duodenum: K31.89

## 2013-12-18 HISTORY — DX: Other chronic pain: G89.29

## 2013-12-18 HISTORY — DX: Sleep apnea, unspecified: G47.30

## 2013-12-18 HISTORY — DX: Cellulitis, unspecified: L03.90

## 2013-12-18 HISTORY — DX: Dorsalgia, unspecified: M54.9

## 2013-12-18 LAB — COMPREHENSIVE METABOLIC PANEL
ALBUMIN: 3.1 g/dL — AB (ref 3.5–5.2)
ALK PHOS: 99 U/L (ref 39–117)
ALT: 10 U/L (ref 0–53)
AST: 10 U/L (ref 0–37)
Anion gap: 10 (ref 5–15)
BILIRUBIN TOTAL: 0.4 mg/dL (ref 0.3–1.2)
BUN: 13 mg/dL (ref 6–23)
CHLORIDE: 100 meq/L (ref 96–112)
CO2: 29 mEq/L (ref 19–32)
Calcium: 10.3 mg/dL (ref 8.4–10.5)
Creatinine, Ser: 0.94 mg/dL (ref 0.50–1.35)
GFR calc Af Amer: 89 mL/min — ABNORMAL LOW (ref >90)
GFR calc non Af Amer: 77 mL/min — ABNORMAL LOW (ref >90)
Glucose, Bld: 96 mg/dL (ref 70–99)
POTASSIUM: 3.9 meq/L (ref 3.7–5.3)
Sodium: 139 mEq/L (ref 137–147)
Total Protein: 7.1 g/dL (ref 6.0–8.3)

## 2013-12-18 LAB — CBC WITH DIFFERENTIAL/PLATELET
BASOS ABS: 0 10*3/uL (ref 0.0–0.1)
BASOS PCT: 0 % (ref 0–1)
Eosinophils Absolute: 0.1 10*3/uL (ref 0.0–0.7)
Eosinophils Relative: 1 % (ref 0–5)
HCT: 40.9 % (ref 39.0–52.0)
HEMOGLOBIN: 13.7 g/dL (ref 13.0–17.0)
Lymphocytes Relative: 7 % — ABNORMAL LOW (ref 12–46)
Lymphs Abs: 1 10*3/uL (ref 0.7–4.0)
MCH: 28.5 pg (ref 26.0–34.0)
MCHC: 33.5 g/dL (ref 30.0–36.0)
MCV: 85.2 fL (ref 78.0–100.0)
MONOS PCT: 9 % (ref 3–12)
Monocytes Absolute: 1.3 10*3/uL — ABNORMAL HIGH (ref 0.1–1.0)
NEUTROS ABS: 12 10*3/uL — AB (ref 1.7–7.7)
NEUTROS PCT: 83 % — AB (ref 43–77)
Platelets: 264 10*3/uL (ref 150–400)
RBC: 4.8 MIL/uL (ref 4.22–5.81)
RDW: 12.3 % (ref 11.5–15.5)
WBC: 14.5 10*3/uL — AB (ref 4.0–10.5)

## 2013-12-18 LAB — CBG MONITORING, ED: GLUCOSE-CAPILLARY: 135 mg/dL — AB (ref 70–99)

## 2013-12-18 LAB — TROPONIN I: Troponin I: <0.3 ng/mL (ref <0.30)

## 2013-12-18 LAB — LIPID PANEL
Cholesterol: 140 mg/dL (ref 0–200)
HDL: 30 mg/dL — AB (ref >39)
LDL CALC: 82 mg/dL (ref 0–99)
Total CHOL/HDL Ratio: 4.7 RATIO
Triglycerides: 142 mg/dL (ref <150)
VLDL: 28 mg/dL (ref 0–40)

## 2013-12-18 LAB — GLUCOSE, CAPILLARY
Glucose-Capillary: 207 mg/dL — ABNORMAL HIGH (ref 70–99)
Glucose-Capillary: 220 mg/dL — ABNORMAL HIGH (ref 70–99)

## 2013-12-18 LAB — PRO B NATRIURETIC PEPTIDE: Pro B Natriuretic peptide (BNP): 239.9 pg/mL (ref 0–450)

## 2013-12-18 MED ORDER — SODIUM CHLORIDE 0.9 % IV SOLN
INTRAVENOUS | Status: DC
  Administered 2013-12-18: 50 mL/h via INTRAVENOUS
  Administered 2013-12-19 – 2013-12-21 (×3): via INTRAVENOUS

## 2013-12-18 MED ORDER — MORPHINE SULFATE 2 MG/ML IJ SOLN: 1.0000 mg | INTRAMUSCULAR | Status: DC | PRN

## 2013-12-18 MED ORDER — INSULIN GLARGINE 100 UNIT/ML ~~LOC~~ SOLN
20.0000 [IU] | Freq: Every day | SUBCUTANEOUS | Status: DC
  Administered 2013-12-19 – 2013-12-21 (×4): 20 [IU] via SUBCUTANEOUS
  Filled 2013-12-18 (×7): qty 0.2

## 2013-12-18 MED ORDER — ACETAMINOPHEN 325 MG PO TABS: 650.0000 mg | ORAL_TABLET | Freq: Four times a day (QID) | ORAL | Status: DC | PRN

## 2013-12-18 MED ORDER — POLYETHYLENE GLYCOL 3350 17 G PO PACK: 17.0000 g | PACK | Freq: Every day | ORAL | Status: DC | PRN

## 2013-12-18 MED ORDER — ALUM & MAG HYDROXIDE-SIMETH 200-200-20 MG/5ML PO SUSP
30.0000 mL | Freq: Four times a day (QID) | ORAL | Status: DC | PRN
  Administered 2013-12-20 (×2): 30 mL via ORAL
  Filled 2013-12-18: qty 30

## 2013-12-18 MED ORDER — PIPERACILLIN-TAZOBACTAM 3.375 G IVPB 30 MIN
3.3750 g | Freq: Once | INTRAVENOUS | Status: AC
  Administered 2013-12-18: 3.375 g via INTRAVENOUS
  Filled 2013-12-18: qty 50

## 2013-12-18 MED ORDER — FLUCONAZOLE IN SODIUM CHLORIDE 200-0.9 MG/100ML-% IV SOLN
200.0000 mg | Freq: Once | INTRAVENOUS | Status: AC
  Administered 2013-12-18: 200 mg via INTRAVENOUS
  Filled 2013-12-18: qty 100

## 2013-12-18 MED ORDER — ACETAMINOPHEN 650 MG RE SUPP: 650.0000 mg | Freq: Four times a day (QID) | RECTAL | Status: DC | PRN

## 2013-12-18 MED ORDER — FLUCONAZOLE IN SODIUM CHLORIDE 200-0.9 MG/100ML-% IV SOLN: 200.0000 mg | INTRAVENOUS | Status: DC

## 2013-12-18 MED ORDER — IOHEXOL 300 MG/ML  SOLN
75.0000 mL | Freq: Once | INTRAMUSCULAR | Status: AC | PRN
  Administered 2013-12-18: 75 mL via INTRAVENOUS

## 2013-12-18 MED ORDER — VANCOMYCIN HCL IN DEXTROSE 1-5 GM/200ML-% IV SOLN
1000.0000 mg | Freq: Two times a day (BID) | INTRAVENOUS | Status: DC
  Administered 2013-12-19 – 2013-12-22 (×7): 1000 mg via INTRAVENOUS
  Filled 2013-12-18 (×14): qty 200

## 2013-12-18 MED ORDER — AMLODIPINE BESYLATE 5 MG PO TABS
5.0000 mg | ORAL_TABLET | Freq: Every day | ORAL | Status: DC
  Administered 2013-12-18 – 2013-12-22 (×5): 5 mg via ORAL
  Filled 2013-12-18 (×5): qty 1

## 2013-12-18 MED ORDER — TRAZODONE HCL 50 MG PO TABS
25.0000 mg | ORAL_TABLET | Freq: Every evening | ORAL | Status: DC | PRN
  Administered 2013-12-20 – 2013-12-21 (×2): 25 mg via ORAL
  Filled 2013-12-18 (×2): qty 1

## 2013-12-18 MED ORDER — ONDANSETRON HCL 4 MG PO TABS: 4.0000 mg | ORAL_TABLET | Freq: Four times a day (QID) | ORAL | Status: DC | PRN

## 2013-12-18 MED ORDER — OXYCODONE HCL 5 MG PO TABS
15.0000 mg | ORAL_TABLET | ORAL | Status: DC | PRN
Start: 2013-12-18 — End: 2013-12-21
  Administered 2013-12-18 – 2013-12-20 (×4): 15 mg via ORAL
  Filled 2013-12-18 (×4): qty 3

## 2013-12-18 MED ORDER — VANCOMYCIN HCL IN DEXTROSE 1-5 GM/200ML-% IV SOLN
1000.0000 mg | Freq: Once | INTRAVENOUS | Status: AC
  Administered 2013-12-18: 1000 mg via INTRAVENOUS
  Filled 2013-12-18: qty 200

## 2013-12-18 MED ORDER — BISACODYL 10 MG RE SUPP: 10.0000 mg | Freq: Every day | RECTAL | Status: DC | PRN

## 2013-12-18 MED ORDER — PANTOPRAZOLE SODIUM 40 MG PO TBEC
40.0000 mg | DELAYED_RELEASE_TABLET | Freq: Every day | ORAL | Status: DC
  Administered 2013-12-18 – 2013-12-19 (×2): 40 mg via ORAL
  Filled 2013-12-18 (×2): qty 1

## 2013-12-18 MED ORDER — ENOXAPARIN SODIUM 40 MG/0.4ML ~~LOC~~ SOLN
40.0000 mg | SUBCUTANEOUS | Status: DC
  Administered 2013-12-18 – 2013-12-19 (×2): 40 mg via SUBCUTANEOUS
  Filled 2013-12-18 (×2): qty 0.4

## 2013-12-18 MED ORDER — ROSUVASTATIN CALCIUM 10 MG PO TABS
10.0000 mg | ORAL_TABLET | Freq: Every day | ORAL | Status: DC
  Administered 2013-12-18 – 2013-12-22 (×5): 10 mg via ORAL
  Filled 2013-12-18 (×5): qty 1

## 2013-12-18 MED ORDER — IRBESARTAN 300 MG PO TABS
300.0000 mg | ORAL_TABLET | Freq: Every day | ORAL | Status: DC
  Administered 2013-12-18 – 2013-12-22 (×5): 300 mg via ORAL
  Filled 2013-12-18 (×5): qty 1

## 2013-12-18 MED ORDER — AMLODIPINE-OLMESARTAN 5-40 MG PO TABS: 1.0000 | ORAL_TABLET | Freq: Every day | ORAL | Status: DC

## 2013-12-18 MED ORDER — SODIUM CHLORIDE 0.9 % IJ SOLN
3.0000 mL | Freq: Two times a day (BID) | INTRAMUSCULAR | Status: DC
  Administered 2013-12-18 – 2013-12-22 (×5): 3 mL via INTRAVENOUS

## 2013-12-18 MED ORDER — FLUCONAZOLE 100 MG PO TABS: 200.0000 mg | ORAL_TABLET | Freq: Once | ORAL | Status: DC

## 2013-12-18 MED ORDER — PIPERACILLIN-TAZOBACTAM 3.375 G IVPB
3.3750 g | Freq: Three times a day (TID) | INTRAVENOUS | Status: DC
  Administered 2013-12-19 – 2013-12-22 (×11): 3.375 g via INTRAVENOUS
  Filled 2013-12-18 (×20): qty 50

## 2013-12-18 MED ORDER — FLUCONAZOLE 100 MG PO TABS: 100.0000 mg | ORAL_TABLET | Freq: Every day | ORAL | Status: DC

## 2013-12-18 MED ORDER — FLUCONAZOLE 100MG IVPB: 100.0000 mg | INTRAVENOUS | Status: DC

## 2013-12-18 MED ORDER — INSULIN ASPART 100 UNIT/ML ~~LOC~~ SOLN
0.0000 [IU] | Freq: Every day | SUBCUTANEOUS | Status: DC
  Administered 2013-12-19 – 2013-12-20 (×2): 2 [IU] via SUBCUTANEOUS

## 2013-12-18 MED ORDER — FLUCONAZOLE 40 MG/ML PO SUSR: 400.0000 mg | Freq: Every day | ORAL | Status: DC

## 2013-12-18 MED ORDER — ONDANSETRON HCL 4 MG/2ML IJ SOLN
4.0000 mg | Freq: Once | INTRAMUSCULAR | Status: AC
  Administered 2013-12-18: 4 mg via INTRAVENOUS
  Filled 2013-12-18: qty 2

## 2013-12-18 MED ORDER — ONDANSETRON HCL 4 MG/2ML IJ SOLN
4.0000 mg | Freq: Four times a day (QID) | INTRAMUSCULAR | Status: DC | PRN
  Administered 2013-12-19 – 2013-12-22 (×6): 4 mg via INTRAVENOUS
  Filled 2013-12-18 (×5): qty 2

## 2013-12-18 MED ORDER — INSULIN ASPART 100 UNIT/ML ~~LOC~~ SOLN
0.0000 [IU] | Freq: Three times a day (TID) | SUBCUTANEOUS | Status: DC
  Administered 2013-12-18 – 2013-12-19 (×2): 5 [IU] via SUBCUTANEOUS
  Administered 2013-12-19 (×2): 3 [IU] via SUBCUTANEOUS
  Administered 2013-12-20: 2 [IU] via SUBCUTANEOUS
  Administered 2013-12-20 – 2013-12-21 (×4): 3 [IU] via SUBCUTANEOUS
  Administered 2013-12-22 (×2): 5 [IU] via SUBCUTANEOUS

## 2013-12-18 MED ORDER — TAMSULOSIN HCL 0.4 MG PO CAPS
0.4000 mg | ORAL_CAPSULE | Freq: Every day | ORAL | Status: DC
  Administered 2013-12-18 – 2013-12-22 (×5): 0.4 mg via ORAL
  Filled 2013-12-18 (×5): qty 1

## 2013-12-18 MED ORDER — NITROGLYCERIN 0.4 MG SL SUBL
0.4000 mg | SUBLINGUAL_TABLET | SUBLINGUAL | Status: DC | PRN
Start: 2013-12-18 — End: 2013-12-22

## 2013-12-18 NOTE — Progress Notes (Signed)
ANTIBIOTIC CONSULT NOTE - INITIAL  Pharmacy Consult for Vancomycin & Zosyn Indication: cellulitis  No Known Allergies  Patient Measurements: Height: 5\' 11"  (180.3 cm) Weight: 190 lb (86.183 kg) IBW/kg (Calculated) : 75.3 Adjusted Body Weight:   Vital Signs: Temp: 98.6 F (37 C) (12/14 0917) Temp Source: Oral (12/14 0917) BP: 150/71 mmHg (12/14 1518) Pulse Rate: 67 (12/14 1157) Intake/Output from previous day:   Intake/Output from this shift:    Labs:  Recent Labs  12/18/13 1008  WBC 14.5*  HGB 13.7  PLT 264  CREATININE 0.94   Estimated Creatinine Clearance: 66.8 mL/min (by C-G formula based on Cr of 0.94). No results for input(s): VANCOTROUGH, VANCOPEAK, VANCORANDOM, GENTTROUGH, GENTPEAK, GENTRANDOM, TOBRATROUGH, TOBRAPEAK, TOBRARND, AMIKACINPEAK, AMIKACINTROU, AMIKACIN in the last 72 hours.   Microbiology: Recent Results (from the past 720 hour(s))  Blood culture (routine x 2)     Status: None (Preliminary result)   Collection Time: 12/18/13 10:08 AM  Result Value Ref Range Status   Specimen Description Blood  Final   Special Requests Normal  Final   Culture NO GROWTH <24 HRS  Final   Report Status PENDING  Incomplete  Blood culture (routine x 2)     Status: None (Preliminary result)   Collection Time: 12/18/13 10:08 AM  Result Value Ref Range Status   Specimen Description Blood  Final   Special Requests Normal  Final   Culture NO GROWTH <24 HRS  Final   Report Status PENDING  Incomplete    Medical History: Past Medical History  Diagnosis Date  . Diabetes mellitus without complication   . Hypertension   . BPH (benign prostatic hyperplasia)   . CAD (coronary artery disease)   . High cholesterol   . GERD (gastroesophageal reflux disease)   . Gastric mass     EGD 9/15    Medications:  Scheduled:  . piperacillin-tazobactam  3.375 g Intravenous Once  . [START ON 12/19/2013] piperacillin-tazobactam (ZOSYN)  IV  3.375 g Intravenous Q8H  . [START ON  12/19/2013] vancomycin  1,000 mg Intravenous Q12H   Assessment: Cellulitis to neck. Vancomycin 1 GM IV and Zosyn 3.375 GM IV given in ED Labs reviewed PTA medications reviewed  Goal of Therapy:  Vancomycin trough level 10-15 mcg/ml  Plan:  Vancomycin 1 GM IV every 12 hours, next dose 12/19/13 at 2 AM Zosyn 3.375 GM IV every 8 hours, next dose midnight Vancomycin trough at steady state Monitor renal function Labs per protocol  Abner Greenspan, Kathy Wahid Bennett 12/18/2013,4:00 PM

## 2013-12-18 NOTE — ED Provider Notes (Signed)
CSN: [id number]     Arrival date & time 12/18/13  5885 History  This chart was scribed for Maudry Diego, MD by Stephania Fragmin, ED Scribe. This patient was seen in room [facility] and the patient's care was started at 9:48 AM.    Chief Complaint  Patient presents with  . Chest Pain   Patient is a 80 y.o. male presenting with vomiting. The history is provided by the patient and the spouse (the pt complains of vomiting ). No language interpreter was used.  Emesis Severity:  Mild Timing:  Intermittent Quality:  Stomach contents Able to tolerate:  Liquids Progression:  Unchanged Chronicity:  New Recent urination:  Normal Associated symptoms: sore throat   Associated symptoms: no abdominal pain, no diarrhea and no headaches      HPI Comments: [name] is a 80 y.o. male who presents to the Emergency Department complaining of nausea that began 2 weeks ago. Patient mentions associated pain on his sides and complains of a boil on his neck onset 2 weeks. His wife also notes a sore throat, difficulty swallowing, and that patient "spits up something clear." Dr. Cindie Laroche referred him to the ED because he couldn't figure out what was wrong with him. Patient denies vomiting.   Past Medical History  Diagnosis Date  . Diabetes mellitus without complication   . Hypertension   . BPH (benign prostatic hyperplasia)   . CAD (coronary artery disease)   . High cholesterol   . GERD (gastroesophageal reflux disease)    Past Surgical History  Procedure Laterality Date  . Cardiac surgery    . Cystoscopy N/A 12/12/2012    Procedure: CYSTOSCOPY FLEXIBLE;  Surgeon: Marissa Nestle, MD;  Location: [facility];  Service: Urology;  Laterality: N/A;  . Colonoscopy  2004    Dr. Laural Golden: three small polyps at cecum, path unknown, external hemorrhoids  . Coronary artery bypass graft      x5  . Cardiac catheterization      stents placed  . Colonoscopy N/A 08/16/2013    Procedure: COLONOSCOPY;  Surgeon: Daneil Dolin, MD;  Location: [facility];  Service: Endoscopy;  Laterality: N/A;  1:00  . Esophagogastroduodenoscopy N/A 08/16/2013    Procedure: ESOPHAGOGASTRODUODENOSCOPY (EGD);  Surgeon: Daneil Dolin, MD;  Location: [facility];  Service: Endoscopy;  Laterality: N/A;  Venia Minks dilation N/A 08/16/2013    Procedure: Venia Minks DILATION;  Surgeon: Daneil Dolin, MD;  Location: [facility];  Service: Endoscopy;  Laterality: N/A;  . Eus N/A 09/07/2013    Procedure: UPPER ENDOSCOPIC ULTRASOUND (EUS) LINEAR;  Surgeon: Milus Banister, MD;  Location: [facility];  Service: Endoscopy;  Laterality: N/A;   Family History  Problem Relation Age of Onset  . Colon cancer Son 106    deceased   History  Substance Use Topics  . Smoking status: Former Smoker -- 1.00 packs/day for 11 years    Quit date: 01/05/1956  . Smokeless tobacco: Not on file  . Alcohol Use: No    Review of Systems  Constitutional: Negative for appetite change and fatigue.  HENT: Positive for sore throat. Negative for congestion, ear discharge and sinus pressure.   Eyes: Negative for discharge.  Respiratory: Negative for cough.   Cardiovascular: Negative for chest pain.  Gastrointestinal: Positive for vomiting. Negative for abdominal pain and diarrhea.  Genitourinary: Negative for frequency and hematuria.  Musculoskeletal: Negative for back pain.  Skin: Negative for rash.  Neurological: Negative for seizures and  headaches.  Psychiatric/Behavioral: Negative for hallucinations.      Allergies  Review of patient's allergies indicates no known allergies.  Home Medications   Prior to Admission medications   Medication Sig Start Date End Date Taking? Authorizing Provider  amLODipine-olmesartan (AZOR) 10-40 MG per tablet Take 1 tablet by mouth every morning.     Historical Provider, MD  aspirin 325 MG EC tablet Take 325 mg by mouth daily.    Historical Provider, MD  dexlansoprazole (DEXILANT) 60 MG capsule Take 60 mg by  mouth daily.    Historical Provider, MD  insulin aspart (NOVOLOG) 100 UNIT/ML injection Inject 20 Units into the skin 2 (two) times daily.    Historical Provider, MD  insulin glargine (LANTUS) 100 UNIT/ML injection Inject 40 Units into the skin at bedtime.     Historical Provider, MD  Oxycodone HCl 10 MG TABS Take 10 mg by mouth 2 (two) times daily.    Historical Provider, MD  pantoprazole (PROTONIX) 40 MG tablet Take 40 mg by mouth daily.    Historical Provider, MD  rosuvastatin (CRESTOR) 10 MG tablet Take 10 mg by mouth daily.    Historical Provider, MD  silodosin (RAPAFLO) 8 MG CAPS capsule Take 8 mg by mouth daily with breakfast.    Historical Provider, MD   BP 160/62 mmHg  Pulse 84  Temp(Src) 98.6 F (37 C) (Oral)  Resp 18  Ht 5\' 11"  (1.803 m)  Wt 190 lb (86.183 kg)  BMI 26.51 kg/m2  SpO2 100% Physical Exam  Constitutional: He is oriented to person, place, and time. He appears well-developed.  HENT:  Head: Normocephalic.  Eyes: Conjunctivae and EOM are normal. No scleral icterus.  Neck: Neck supple. No thyromegaly present.  Cardiovascular: Normal rate and regular rhythm.  Exam reveals no gallop and no friction rub.   No murmur heard. Pulmonary/Chest: No stridor. He has no wheezes. He has no rales. He exhibits no tenderness.  Abdominal: He exhibits no distension. There is no tenderness. There is no rebound.  Musculoskeletal: Normal range of motion. He exhibits no edema.  Lymphadenopathy:    He has no cervical adenopathy.  Neurological: He is oriented to person, place, and time. He exhibits normal muscle tone. Coordination normal.  Skin: No rash noted. No erythema.  5 x 5 cm indurated, tender raised area to the lower part of the posterior neck.  Psychiatric: He has a normal mood and affect. His behavior is normal.  Nursing note and vitals reviewed.   ED Course  Procedures (including critical care time)  DIAGNOSTIC STUDIES: Oxygen Saturation is 100% on room air, normal by  my interpretation.    COORDINATION OF CARE: 9:51 AM - Discussed treatment plan with pt at bedside which includes tests and possible stay and pt agreed to plan.   Labs Review Labs Reviewed  CBG MONITORING, ED - Abnormal; Notable for the following:    Glucose-Capillary 135 (*)    All other components within normal limits    Imaging Review No results found.   EKG Interpretation None      MDM   Final diagnoses:  None   Cellulitis to post neck.   I spoke with Dr. Arnoldo Morale and he felt that he could help take care of this at [facility]  The chart was scribed for me under my direct supervision.  I personally performed the history, physical, and medical decision making and all procedures in the evaluation of this patient.Maudry Diego, MD 12/18/13 [phone]

## 2013-12-18 NOTE — ED Notes (Signed)
Pt reports nausea, chest discomfort, weakness and dizziness, onset 1 week ago.

## 2013-12-18 NOTE — H&P (Signed)
[facility] History and Physical  [name] FKC:[id number] DOB: 1931-08-25 DOA: 12/18/2013  Referring physician:  PCP: Maricela Curet, MD   Chief Complaint: pain posterior neck/sore throat/nausea/chest pain  HPI: [name] is a very pleasant 80 y.o. male with a past medical history of diabetes, CAD status post stents, hypertension, presents to the emergency department with chief complaint of pain in the back of his neck persistent sore throat and nausea with intermittent chest pain. Initial evaluation reveals a cellulitis posterior neck, leukocytosis.  He reports about 2 weeks ago he developed a "knot" on the right posterior neck. Distally it was just under the skin and about the size of a quarter. Over the next couple of weeks it became larger and red and painful. Associated symptoms include chills subjective fever nausea without vomiting decreased appetite. In addition he states he has experienced intermittent left anterior chest pain worse with a deep breath. He reports the pain does not radiate it usually resolves itself within 1-2 minutes and is not associated with any activity. He denies any shortness of breath cough diaphoresis during these episodes. In addition he went to his primary care provider regarding his neck he was given antibiotics which he said he took for 3-4 days but he stopped because "it took the hide right off my tongue". He developed painful swallowing with white exudate in the back of his throat. He denies any choking or coughing with eating or drinking. Workup in the emergency department included pleat blood count significant for leukocytosis of 14.5, complete metabolic panel that is unremarkable, initial troponin is negative, CT of the soft tissue of his neck reveals  evolving focal fluid collection within the subcutaneous fat over the right posterior neck corresponding to patient's clinical palpable abnormality and measuring approximately 1.5 x 2.8 x 3.7  cm in AP, transverse and craniocaudal dimensions. Stranding of the adjacent fat. This likely represents a focal inflammatory/infectious process.This process abuts the deep third of the scan and is not involve the deeper muscle compartment. Chest x-ray with pulmonary vascular congestion with bibasilar atelectasis and LEFT perihilar infiltrate which could represent asymmetric edema or pneumonia.  In the emergency department he is afebrile hemodynamically stable and not hypoxic. Received Zofran Zosyn and vancomycin.  Review of Systems:   10 point review of systems complete and all systems are negative except as indicated in the history of present illness Past Medical History  Diagnosis Date  . Diabetes mellitus without complication   . Hypertension   . BPH (benign prostatic hyperplasia)   . CAD (coronary artery disease)   . High cholesterol   . GERD (gastroesophageal reflux disease)   . Gastric mass     EGD 9/15   Past Surgical History  Procedure Laterality Date  . Cardiac surgery    . Cystoscopy N/A 12/12/2012    Procedure: CYSTOSCOPY FLEXIBLE;  Surgeon: Marissa Nestle, MD;  Location: [facility];  Service: Urology;  Laterality: N/A;  . Colonoscopy  2004    Dr. Laural Golden: three small polyps at cecum, path unknown, external hemorrhoids  . Coronary artery bypass graft      x5  . Cardiac catheterization      stents placed  . Colonoscopy N/A 08/16/2013    Procedure: COLONOSCOPY;  Surgeon: Daneil Dolin, MD;  Location: [facility];  Service: Endoscopy;  Laterality: N/A;  1:00  . Esophagogastroduodenoscopy N/A 08/16/2013    Procedure: ESOPHAGOGASTRODUODENOSCOPY (EGD);  Surgeon: Daneil Dolin, MD;  Location: [facility];  Service: Endoscopy;  Laterality: N/A;  Venia Minks dilation N/A 08/16/2013    Procedure: Venia Minks DILATION;  Surgeon: Daneil Dolin, MD;  Location: [facility];  Service: Endoscopy;  Laterality: N/A;  . Eus N/A 09/07/2013    Procedure: UPPER ENDOSCOPIC ULTRASOUND (EUS) LINEAR;   Surgeon: Milus Banister, MD;  Location: [facility];  Service: Endoscopy;  Laterality: N/A;   Social History:  reports that he quit smoking about 57 years ago. He does not have any smokeless tobacco history on file. He reports that he does not drink alcohol or use illicit drugs.  No Known Allergies  Family History  Problem Relation Age of Onset  . Colon cancer Son 28    deceased     Prior to Admission medications   Medication Sig Start Date End Date Taking? Authorizing Provider  amLODipine-olmesartan (AZOR) 5-40 MG per tablet Take 1 tablet by mouth daily.   Yes Historical Provider, MD  aspirin 325 MG EC tablet Take 325 mg by mouth daily.   Yes Historical Provider, MD  insulin aspart (NOVOLOG FLEXPEN) 100 UNIT/ML FlexPen Inject 20 Units into the skin 2 (two) times daily with a meal.   Yes Historical Provider, MD  Insulin Glargine (LANTUS SOLOSTAR) 100 UNIT/ML Solostar Pen Inject 40 Units into the skin at bedtime.   Yes Historical Provider, MD  oxyCODONE (ROXICODONE) 15 MG immediate release tablet Take 15-30 mg by mouth every 4 (four) hours as needed for pain.   Yes Historical Provider, MD  pantoprazole (PROTONIX) 40 MG tablet Take 40 mg by mouth daily.   Yes Historical Provider, MD  rosuvastatin (CRESTOR) 10 MG tablet Take 10 mg by mouth daily.   Yes Historical Provider, MD  silodosin (RAPAFLO) 8 MG CAPS capsule Take 8 mg by mouth daily with breakfast.   Yes Historical Provider, MD   Physical Exam: Filed Vitals:   12/18/13 0917 12/18/13 1157 12/18/13 1321 12/18/13 1518  BP: 160/62 125/67 148/73 150/71  Pulse: 84 67    Temp: 98.6 F (37 C)     TempSrc: Oral     Resp: 18 16 17 19   Height: 5\' 11"  (1.803 m)     Weight: 86.183 kg (190 lb)     SpO2: 100% 98% 96% 95%    Wt Readings from Last 3 Encounters:  12/18/13 86.183 kg (190 lb)  08/16/13 95.709 kg (211 lb)  08/01/13 95.981 kg (211 lb 9.6 oz)    General:  Appears calm and comfortable Eyes: PERRL, normal lids, irises &  conjunctiva ENT: grossly normal hearing, mucous membranes of his mouth are moist and very red, tongue is somewhat fleshy appearing, see no exudate. Poor dentition Neck: no LAD, masses or thyromegaly, mild edema on right lateral neck under year nontender mild erythema as well somewhat diminished range of motion Cardiovascular: RRR, no m/r/g. No LE edema. Respiratory: Normal effort good air movement very fine crackles bilateral bases particularly on the right no wheeze no rhonchi Abdomen: soft, ntnd positive bowel sounds Skin: Right posterior neck with large indurated raised erythemic area that is tender to touch extending to base of his neck. There is no visible drainage there is no odor Musculoskeletal: grossly normal tone BUE/BLE Psychiatric: grossly normal mood and affect, speech fluent and appropriate Neurologic: grossly non-focal. Speech clear facial symmetry           Labs on Admission:  Basic Metabolic Panel:  Recent Labs Lab 12/18/13 1008  NA 139  K 3.9  CL 100  CO2 29  GLUCOSE 96  BUN 13  CREATININE 0.94  CALCIUM 10.3   Liver Function Tests:  Recent Labs Lab 12/18/13 1008  AST 10  ALT 10  ALKPHOS 99  BILITOT 0.4  PROT 7.1  ALBUMIN 3.1*   No results for input(s): LIPASE, AMYLASE in the last 168 hours. No results for input(s): AMMONIA in the last 168 hours. CBC:  Recent Labs Lab 12/18/13 1008  WBC 14.5*  NEUTROABS 12.0*  HGB 13.7  HCT 40.9  MCV 85.2  PLT 264   Cardiac Enzymes:  Recent Labs Lab 12/18/13 1008  TROPONINI <0.30    BNP (last 3 results) No results for input(s): PROBNP in the last 8760 hours. CBG:  Recent Labs Lab 12/18/13 0927  GLUCAP 135*    Radiological Exams on Admission: Ct Soft Tissue Neck W Contrast  12/18/2013   CLINICAL DATA:  Swelling/boil right side of posterior neck marked by BB beginning 2 weeks ago. Diabetic.  EXAM: CT NECK WITH CONTRAST  TECHNIQUE: Multidetector CT imaging of the neck was performed using the  standard protocol following the bolus administration of intravenous contrast.  CONTRAST:  75 mL Omnipaque 300 IV  COMPARISON:  None.  FINDINGS: The visualized orbits are normal and symmetric. Paranasal sinuses and mastoid air cells are clear. Spaces of the suprahyoid neck are within normal. There is mild calcified plaque at the carotid bifurcations and proximal internal carotid arteries bilaterally. Airway and epiglottis are within normal.  In the area of clinical concern over the posterior right neck subcutaneous fat at the level of C1-C3 is a focal inflammatory/infectious process. There is moderate stranding of the subcutaneous fat with a somewhat poorly defined oval likely evolving fluid collection measuring approximately 1.5 x 2.8 x 3.7 cm in AP, transverse and craniocaudal dimensions. This fluid collection abuts the deep layer of the skin, but is centered within the subcutaneous fat this inflammatory/infectious process does not involve the deeper paraspinal muscle compartment. No significant adenopathy.  Infrahyoid neck demonstrates several small thyroid nodules measuring up to 1.1 cm in size.  There is calcified plaque over the thoracic aorta. Visualized lung apices are within normal. There are mild degenerate changes of the spine with disc disease at the C5-6 level.  IMPRESSION: Evolving focal fluid collection within the subcutaneous fat over the right posterior neck corresponding to patient's clinical palpable abnormality and measuring approximately 1.5 x 2.8 x 3.7 cm in AP, transverse and craniocaudal dimensions. Stranding of the adjacent fat. This likely represents a focal inflammatory/infectious process. This process abuts the deep third of the scan and is not involve the deeper muscle compartment.  Multiple thyroid nodules measuring up to 1.1 cm in size likely multinodular goiter. Recommend followup as clinically indicated.  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  Atherosclerotic  disease of the carotid bifurcations and proximal internal carotid arteries bilaterally.   Electronically Signed   By: Marin Olp M.D.   On: 12/18/2013 14:02   Dg Chest Portable 1 View  12/18/2013   CLINICAL DATA:  Nausea, chest discomfort, weakness and dizziness beginning 1 week ago, history diabetes, hypertension, coronary artery disease post CABG  EXAM: PORTABLE CHEST - 1 VIEW  COMPARISON:  Portable exam 1011 hr compared to 12/09/2012  FINDINGS: Enlargement of cardiac silhouette post CABG.  Pulmonary vascular congestion.  Mild bibasilar atelectasis.  LEFT perihilar infiltrate question infection versus asymmetric edema.  Remaining lungs clear.  No pleural effusion or pneumothorax.  IMPRESSION: Enlargement of cardiac silhouette post CABG.  Pulmonary  vascular congestion with bibasilar atelectasis and LEFT perihilar infiltrate which could represent asymmetric edema or pneumonia.   Electronically Signed   By: Lavonia Dana M.D.   On: 12/18/2013 10:23    EKG: pending  Assessment/Plan Principal Problem:   Cellulitis: admit to medical floor. CT as above Continue Vanc and Zosyn started in ED. He is afebrile and non-toxic appearing. Cultures were drawn in the emergency department Dr Arnoldo Morale with general surgery reportedly evaluated and opined that patient can stay at [facility] and he will see in am. Will provide comfort measures in form of warm compress. Will provide antiemetic and analgesia as needed Active Problems: Pulmonary vascular congestion per x-ray. Patient does not appear volume overloaded on admission. He is not hypoxic. No documented history of heart failure. Smoker that quit 40 years ago. Obtain a proBNP. Monitor closely  Chest pain at rest: atypical. Pt hx CAD s/p CABG in 2000. No chest pain on exam.  Initial troponin negative. Will cycle cardiac enzymes obtain an EKG.    Thrush, oral: I clearly related to antibiotic taking as outpatient. Denies any choking or coughing with eating or  drinking. Will start Diflucan monitor    Diabetes: Does not check his CBG daily. He is on Lantus 40 units and NovoLog 20 at mealtime. I will provide Lantus at half his home dose and use sliding scale insulin for optimal control as his appetite is somewhat unreliable. Obtain a hemoglobin A1c   Essential hypertension: Somewhat on the high side. He has not taken his home antihypertensive medications yet. I will continue these. They include amlodipine, olmesartan. Will monitor closely    Obesity (BMI 30-39.9): BMI 31.3 nutritional consult    Nausea and vomiting: He received Zofran in the emergency department reports improved and requesting food at the time of admission. Provide car modified diet and Zosyn as needed    Chronic back pain: Appears stable at baseline    Leukocytosis: Related to #1. He is afebrile and hemodynamically stable. 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 Repeat imaging in 12 months recommended by GI per chart review     Code Status: full DVT Prophylaxis: Family Communication: girlfriend at bedside Disposition Plan: home when ready  Time spent: 60 minutes  [facility]

## 2013-12-19 ENCOUNTER — Encounter (HOSPITAL_COMMUNITY): Payer: Self-pay | Admitting: Gastroenterology

## 2013-12-19 DIAGNOSIS — B370 Candidal stomatitis: Secondary | ICD-10-CM

## 2013-12-19 DIAGNOSIS — R1310 Dysphagia, unspecified: Secondary | ICD-10-CM

## 2013-12-19 DIAGNOSIS — R112 Nausea with vomiting, unspecified: Secondary | ICD-10-CM

## 2013-12-19 DIAGNOSIS — L0390 Cellulitis, unspecified: Secondary | ICD-10-CM

## 2013-12-19 DIAGNOSIS — L0291 Cutaneous abscess, unspecified: Secondary | ICD-10-CM

## 2013-12-19 LAB — GLUCOSE, CAPILLARY
GLUCOSE-CAPILLARY: 187 mg/dL — AB (ref 70–99)
GLUCOSE-CAPILLARY: 285 mg/dL — AB (ref 70–99)
Glucose-Capillary: 231 mg/dL — ABNORMAL HIGH (ref 70–99)
Glucose-Capillary: 229 mg/dL — ABNORMAL HIGH (ref 70–99)

## 2013-12-19 LAB — URINALYSIS, ROUTINE W REFLEX MICROSCOPIC
BILIRUBIN URINE: NEGATIVE
Glucose, UA: NEGATIVE mg/dL
Hgb urine dipstick: NEGATIVE
Ketones, ur: NEGATIVE mg/dL
LEUKOCYTES UA: NEGATIVE
NITRITE: NEGATIVE
PH: 5 (ref 5.0–8.0)
Protein, ur: 100 mg/dL — AB
Specific Gravity, Urine: >1.03 — ABNORMAL HIGH (ref 1.005–1.030)
UROBILINOGEN UA: 0.2 mg/dL (ref 0.0–1.0)

## 2013-12-19 LAB — CBC
HCT: 37.6 % — ABNORMAL LOW (ref 39.0–52.0)
Hemoglobin: 12.4 g/dL — ABNORMAL LOW (ref 13.0–17.0)
MCH: 28.6 pg (ref 26.0–34.0)
MCHC: 33 g/dL (ref 30.0–36.0)
MCV: 86.6 fL (ref 78.0–100.0)
PLATELETS: 258 10*3/uL (ref 150–400)
RBC: 4.34 MIL/uL (ref 4.22–5.81)
RDW: 12.4 % (ref 11.5–15.5)
WBC: 14.2 10*3/uL — AB (ref 4.0–10.5)

## 2013-12-19 LAB — BASIC METABOLIC PANEL
Anion gap: 11 (ref 5–15)
BUN: 14 mg/dL (ref 6–23)
CO2: 30 mEq/L (ref 19–32)
Calcium: 9.8 mg/dL (ref 8.4–10.5)
Chloride: 98 mEq/L (ref 96–112)
Creatinine, Ser: 1.13 mg/dL (ref 0.50–1.35)
GFR calc non Af Amer: 59 mL/min — ABNORMAL LOW (ref >90)
GFR, EST AFRICAN AMERICAN: 69 mL/min — AB (ref >90)
Glucose, Bld: 279 mg/dL — ABNORMAL HIGH (ref 70–99)
POTASSIUM: 4.4 meq/L (ref 3.7–5.3)
SODIUM: 139 meq/L (ref 137–147)

## 2013-12-19 LAB — URINE MICROSCOPIC-ADD ON

## 2013-12-19 LAB — SURGICAL PCR SCREEN
MRSA, PCR: NEGATIVE
Staphylococcus aureus: NEGATIVE

## 2013-12-19 LAB — HEMOGLOBIN A1C
HEMOGLOBIN A1C: 10 % — AB (ref <5.7)
Mean Plasma Glucose: 240 mg/dL — ABNORMAL HIGH (ref <117)

## 2013-12-19 LAB — TROPONIN I: Troponin I: <0.3 ng/mL (ref <0.30)

## 2013-12-19 MED ORDER — INFLUENZA VAC SPLIT QUAD 0.5 ML IM SUSY
0.5000 mL | PREFILLED_SYRINGE | INTRAMUSCULAR | Status: AC
  Administered 2013-12-22: 0.5 mL via INTRAMUSCULAR
  Filled 2013-12-19 (×2): qty 0.5

## 2013-12-19 MED ORDER — PANTOPRAZOLE SODIUM 40 MG PO TBEC
40.0000 mg | DELAYED_RELEASE_TABLET | Freq: Two times a day (BID) | ORAL | Status: DC
  Administered 2013-12-19 – 2013-12-22 (×5): 40 mg via ORAL
  Filled 2013-12-19 (×5): qty 1

## 2013-12-19 MED ORDER — ONDANSETRON 4 MG PO TBDP
4.0000 mg | ORAL_TABLET | Freq: Three times a day (TID) | ORAL | Status: DC
  Administered 2013-12-19 – 2013-12-22 (×8): 4 mg via ORAL
  Filled 2013-12-19 (×7): qty 1

## 2013-12-19 MED ORDER — ENSURE COMPLETE PO LIQD
237.0000 mL | Freq: Two times a day (BID) | ORAL | Status: DC
  Administered 2013-12-19: 237 mL via ORAL

## 2013-12-19 MED ORDER — PNEUMOCOCCAL VAC POLYVALENT 25 MCG/0.5ML IJ INJ
0.5000 mL | INJECTION | INTRAMUSCULAR | Status: DC
  Filled 2013-12-19: qty 0.5

## 2013-12-19 MED ORDER — FLUCONAZOLE 100 MG PO TABS
100.0000 mg | ORAL_TABLET | Freq: Every day | ORAL | Status: DC
  Administered 2013-12-19 – 2013-12-22 (×4): 100 mg via ORAL
  Filled 2013-12-19 (×4): qty 1

## 2013-12-19 MED ORDER — CHLORHEXIDINE GLUCONATE 4 % EX LIQD
1.0000 "application " | Freq: Once | CUTANEOUS | Status: AC
  Administered 2013-12-19: 1 via TOPICAL
  Filled 2013-12-19: qty 15

## 2013-12-19 MED ORDER — GLUCERNA SHAKE PO LIQD
237.0000 mL | Freq: Two times a day (BID) | ORAL | Status: DC
  Administered 2013-12-20 – 2013-12-22 (×3): 237 mL via ORAL

## 2013-12-19 NOTE — Progress Notes (Signed)
UR chart review completed.  

## 2013-12-19 NOTE — Progress Notes (Signed)
Patient admitted with chronic dyspepsia chronic nausea and progressive dysphasia status post esophageal dilatation within the last year known insulin-dependent diabetic that is post CABG 4 progressive malaise he likewise has an presumed abscess in the posterior cervical region rated with oral antibiotics this week or but he was not able to swallow all of his pills will be seen by surgery for this consideration given to esophageal motility disorder versus re-stenosis of LES. [name] GMW:[id number] DOB: 1931/08/14 DOA: 12/18/2013 PCP: Maricela Curet, MD             Physical Exam: Blood pressure 135/61, pulse 62, temperature 98.5 F (36.9 C), temperature source Oral, resp. rate 18, height 5\' 11"  (1.803 m), weight 189 lb 9.5 oz (86 kg), SpO2 95 %. neck shows diffuse 2 x 2 centimeter soft tissue mass mildly tender mild rubor JVD no carotid bruits no thyromegaly lungs clear to A&P no rales wheeze or rhonchi heart regular rhythm no S3 or S4 no heaves rubs abdomen soft nontender bowel sounds normoactive   Investigations:  Recent Results (from the past 240 hour(s))  Blood culture (routine x 2)     Status: None (Preliminary result)   Collection Time: 12/18/13 10:08 AM  Result Value Ref Range Status   Specimen Description Blood  Final   Special Requests Normal  Final   Culture NO GROWTH <24 HRS  Final   Report Status PENDING  Incomplete  Blood culture (routine x 2)     Status: None (Preliminary result)   Collection Time: 12/18/13 10:08 AM  Result Value Ref Range Status   Specimen Description Blood  Final   Special Requests Normal  Final   Culture NO GROWTH <24 HRS  Final   Report Status PENDING  Incomplete     Basic Metabolic Panel:  Recent Labs  12/18/13 1008  NA 139  K 3.9  CL 100  CO2 29  GLUCOSE 96  BUN 13  CREATININE 0.94  CALCIUM 10.3   Liver Function Tests:  Recent Labs  12/18/13 1008  AST 10  ALT 10  ALKPHOS 99  BILITOT 0.4  PROT 7.1  ALBUMIN 3.1*      CBC:  Recent Labs  12/18/13 1008  WBC 14.5*  NEUTROABS 12.0*  HGB 13.7  HCT 40.9  MCV 85.2  PLT 264    Ct Soft Tissue Neck W Contrast  12/18/2013   CLINICAL DATA:  Swelling/boil right side of posterior neck marked by BB beginning 2 weeks ago. Diabetic.  EXAM: CT NECK WITH CONTRAST  TECHNIQUE: Multidetector CT imaging of the neck was performed using the standard protocol following the bolus administration of intravenous contrast.  CONTRAST:  75 mL Omnipaque 300 IV  COMPARISON:  None.  FINDINGS: The visualized orbits are normal and symmetric. Paranasal sinuses and mastoid air cells are clear. Spaces of the suprahyoid neck are within normal. There is mild calcified plaque at the carotid bifurcations and proximal internal carotid arteries bilaterally. Airway and epiglottis are within normal.  In the area of clinical concern over the posterior right neck subcutaneous fat at the level of C1-C3 is a focal inflammatory/infectious process. There is moderate stranding of the subcutaneous fat with a somewhat poorly defined oval likely evolving fluid collection measuring approximately 1.5 x 2.8 x 3.7 cm in AP, transverse and craniocaudal dimensions. This fluid collection abuts the deep layer of the skin, but is centered within the subcutaneous fat this inflammatory/infectious process does not involve the deeper paraspinal muscle compartment. No significant adenopathy.  Infrahyoid neck demonstrates several small thyroid nodules measuring up to 1.1 cm in size.  There is calcified plaque over the thoracic aorta. Visualized lung apices are within normal. There are mild degenerate changes of the spine with disc disease at the C5-6 level.  IMPRESSION: Evolving focal fluid collection within the subcutaneous fat over the right posterior neck corresponding to patient's clinical palpable abnormality and measuring approximately 1.5 x 2.8 x 3.7 cm in AP, transverse and craniocaudal dimensions. Stranding of the  adjacent fat. This likely represents a focal inflammatory/infectious process. This process abuts the deep third of the scan and is not involve the deeper muscle compartment.  Multiple thyroid nodules measuring up to 1.1 cm in size likely multinodular goiter. Recommend followup as clinically indicated.  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  Atherosclerotic disease of the carotid bifurcations and proximal internal carotid arteries bilaterally.   Electronically Signed   By: Marin Olp M.D.   On: 12/18/2013 14:02   Dg Chest Portable 1 View  12/18/2013   CLINICAL DATA:  Nausea, chest discomfort, weakness and dizziness beginning 1 week ago, history diabetes, hypertension, coronary artery disease post CABG  EXAM: PORTABLE CHEST - 1 VIEW  COMPARISON:  Portable exam 1011 hr compared to 12/09/2012  FINDINGS: Enlargement of cardiac silhouette post CABG.  Pulmonary vascular congestion.  Mild bibasilar atelectasis.  LEFT perihilar infiltrate question infection versus asymmetric edema.  Remaining lungs clear.  No pleural effusion or pneumothorax.  IMPRESSION: Enlargement of cardiac silhouette post CABG.  Pulmonary vascular congestion with bibasilar atelectasis and LEFT perihilar infiltrate which could represent asymmetric edema or pneumonia.   Electronically Signed   By: Lavonia Dana M.D.   On: 12/18/2013 10:23      Medications:  Impression: Progressive dyspepsia and nausea and dysphagia status post esophageal dilatation Principal Problem:   Cellulitis and abscess Active Problems:   Essential hypertension   Obesity (BMI 30-39.9): BMI 31.3   Nausea and vomiting   Thrush, oral   Chest pain at rest   Leukocytosis   Diabetes   Chronic back pain   Pulmonary vascular congestion   Dysphagia   Odynophagia     Plan: Continue vancomycin and Zosyn. Gastroenterology consultation regarding nausea and dysfunction surgical consultation regarding presumed neck abscess    Consultants: Gastroenterology and surgery    Procedures   Antibiotics: Thank and Zosyn                  Code Status: Full   Family Communication:    Disposition Plan surgery and gastroenterology consults continue dual antibiotics serum lipase today  Time spent: 20 minutes   LOS: 1 day   Tyauna Lacaze M   12/19/2013, 6:39 AM

## 2013-12-19 NOTE — Progress Notes (Signed)
INITIAL NUTRITION ASSESSMENT  DOCUMENTATION CODES Per approved criteria  -Not Applicable   INTERVENTION: Glucerna Shake po TID, each supplement provides 220 kcal and 10 grams of protein  Recommend ST evaluate pt for swallow difficulty   NUTRITION DIAGNOSIS: Inadequate oral intake  related to swallow difficulty as evidenced by 10% wt loss and pt diet hx  Goal: Pt to meet >/= 90% of their estimated nutrition needs    Monitor:  Diet advancement, labs and weight changes  Reason for Assessment: Malnutrition Screen  80 y.o. male  Admitting Dx: Cellulitis and abscess  ASSESSMENT: Pt is complaining of swallow difficulty which he says has become worse over past 2 weeks. He has a neck abscess and is to have an incision and drainage tomorrow. He is also receiving antibiotics.  He is c/o increased nausea (due to antibiotics?) and says that all his foods and even water taste bitter. He has been tolerating soups and drinking Ensure.  His blood glucose is poorly controlled. HgA1C =10%.   Pt has significant wt loss of 10% over past 4 months. No overt physical signs of malnutrition noted. Weight loss may be related to hyperglycemia vs swallow difficulty?  Height: Ht Readings from Last 1 Encounters:  12/18/13 5\' 11"  (1.803 m)    Weight: Wt Readings from Last 1 Encounters:  12/19/13 189 lb 9.5 oz (86 kg)    Ideal Body Weight: 172#  % Ideal Body Weight: 110%  Wt Readings from Last 10 Encounters:  12/19/13 189 lb 9.5 oz (86 kg)  08/16/13 211 lb (95.709 kg)  08/01/13 211 lb 9.6 oz (95.981 kg)  12/09/12 203 lb 6.4 oz (92.262 kg)  06/02/12 226 lb 8 oz (102.74 kg)    Usual Body Weight: 211#  % Usual Body Weight: 90%  BMI:  Body mass index is 26.45 kg/(m^2). overweight  Estimated Nutritional Needs: Kcal:  Protein:  Fluid:   Skin:   Diet Order: Diet full liquid  EDUCATION NEEDS: -No education needs identified at this time   Intake/Output Summary (Last 24 hours) at  12/19/13 1541 Last data filed at 12/19/13 1328  Gross per 24 hour  Intake   1000 ml  Output    100 ml  Net    900 ml    Last BM:   Labs:   Recent Labs Lab 12/18/13 1008 12/19/13 0636  NA 139 139  K 3.9 4.4  CL 100 98  CO2 29 30  BUN 13 14  CREATININE 0.94 1.13  CALCIUM 10.3 9.8  GLUCOSE 96 279*    CBG (last 3)   Recent Labs  12/18/13 2102 12/19/13 0727 12/19/13 1108  GLUCAP 220* 231* 187*    Scheduled Meds: . amLODipine  5 mg Oral Daily   And  . irbesartan  300 mg Oral Daily  . enoxaparin (LOVENOX) injection  40 mg Subcutaneous Q24H  . feeding supplement (ENSURE COMPLETE)  237 mL Oral BID BM  . fluconazole  100 mg Oral Daily  . [START ON 12/20/2013] Influenza vac split quadrivalent PF  0.5 mL Intramuscular Tomorrow-1000  . insulin aspart  0-15 Units Subcutaneous TID WC  . insulin aspart  0-5 Units Subcutaneous QHS  . insulin glargine  20 Units Subcutaneous QHS  . ondansetron  4 mg Oral TID WC & HS  . pantoprazole  40 mg Oral BID AC  . piperacillin-tazobactam (ZOSYN)  IV  3.375 g Intravenous Q8H  . [START ON 12/20/2013] pneumococcal 23 valent vaccine  0.5 mL Intramuscular Tomorrow-1000  .  rosuvastatin  10 mg Oral Daily  . sodium chloride  3 mL Intravenous Q12H  . tamsulosin  0.4 mg Oral Daily  . vancomycin  1,000 mg Intravenous Q12H    Continuous Infusions: . sodium chloride 50 mL/hr (12/18/13 1817)    Past Medical History  Diagnosis Date  . Diabetes mellitus without complication   . Hypertension   . BPH (benign prostatic hyperplasia)   . CAD (coronary artery disease)   . High cholesterol   . GERD (gastroesophageal reflux disease)   . Gastric mass     EGD 9/15  . Pulmonary vascular congestion     per xray 12/15  . Cellulitis     12/15  . Chronic back pain     Past Surgical History  Procedure Laterality Date  . Cardiac surgery    . Cystoscopy N/A 12/12/2012    Procedure: CYSTOSCOPY FLEXIBLE;  Surgeon: Marissa Nestle, MD;  Location: [facility];  Service: Urology;  Laterality: N/A;  . Colonoscopy  2004    Dr. Laural Golden: three small polyps at cecum, path unknown, external hemorrhoids  . Coronary artery bypass graft      x5  . Cardiac catheterization      stents placed  . Colonoscopy N/A 08/16/2013    Dr. [name]: incomplete prep. multiple tubular adenomas, multiple biopsies. Needs surveillance in Aug 2016 due to poor prep  . Esophagogastroduodenoscopy N/A 08/16/2013    Dr. [name]: normal esophagus s/p [facility] dilation, gastric erosions, submucosal gastric mass vs extrinsic mass  . Maloney dilation N/A 08/16/2013    Procedure: Venia Minks DILATION;  Surgeon: Daneil Dolin, MD;  Location: [facility];  Service: Endoscopy;  Laterality: N/A;  . Eus N/A 09/07/2013    Dr. 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

## 2013-12-19 NOTE — Progress Notes (Signed)
Inpatient Diabetes Program Recommendations  AACE/ADA: New Consensus Statement on Inpatient Glycemic Control (2013)  Target Ranges:  Prepandial:   less than 140 mg/dL      Peak postprandial:   less than 180 mg/dL (1-2 hours)      Critically ill patients:  140 - 180 mg/dL   Results for FARAZ, [name] (MRN [id number]) as of 12/19/2013 09:37  Ref. Range 12/18/2013 09:27 12/18/2013 18:04 12/18/2013 21:02 12/19/2013 07:27  Glucose-Capillary Latest Range: 70-99 mg/dL 135 (H) 207 (H) 220 (H) 231 (H)  Results for AKIF, [name] (MRN [id number]) as of 12/19/2013 09:37  Ref. Range 12/18/2013 10:08  Hgb A1c MFr Bld Latest Range: <5.7 % 10.0 (H)   Diabetes history: DM2 Outpatient Diabetes medications: Lantus 40 units QHS, Novolog 20 units BID with meal Current orders for Inpatient glycemic control: Lantus 20 units QHS, Novolog 0-15 units AC, Novolog 0-5 units HS  Inpatient Diabetes Program Recommendations Insulin - Basal: Please consider increasing Lantus to 25 units QHS and continue to adjust until fasting glucose is less than 180 mg/dl. HgbA1C: A1C 10.0% on 12/18/13 indicating poor glycemic control.  Thanks, Barnie Alderman, RN, MSN, CCRN, CDE Diabetes Coordinator Inpatient Diabetes Program [phone] (Team Pager) [phone] ([facility] office) [phone] [facility] office)

## 2013-12-19 NOTE — Care Management Note (Addendum)
    Page 1 of 1   12/22/2013     2:47:12 PM CARE MANAGEMENT NOTE 12/22/2013  Patient:  [name], [name]   Account Number:  [id number]  Date Initiated:  12/19/2013  Documentation initiated by:  Theophilus Kinds  Subjective/Objective Assessment:   Pt admitted from home with cellulitis with abcess. Pt lives alone and will return home at [place]. Pt has a daughter who is very active in the care of the pt. Pt is independent with ADl's.     Action/Plan:   Will continue to follow for discharge planning needs. ? need for [facility] RN at discharge for dressing changes.   Anticipated DC Date:  12/22/2013   Anticipated DC Plan:  [place]  CM consult      Choice offered to / List presented to:             Status of service:  Completed, signed off Medicare Important Message given?  YES (If response is "NO", the following Medicare IM given date fields will be blank) Date Medicare IM given:  12/22/2013 Medicare IM given by:  Theophilus Kinds Date Additional Medicare IM given:   Additional Medicare IM given by:    Discharge Disposition:  HOME/SELF CARE  Per UR Regulation:    If discussed at Long Length of Stay Meetings, dates discussed:    Comments:  12/22/13 [place], RN BSN CM Pt discharged home today. Pt refuses any HH services at this time. Pt given number for Meals on Wheels. No other CM needs noted.  12/19/13 [place], RN BSN CM

## 2013-12-19 NOTE — Consult Note (Signed)
Reason for Consult: Neck abscess Referring Physician: Dr. Don Diego  [name] is an 80 y.o. male.  HPI: Patient is an 80-year-old white male with multiple medical problems who presents with a weeklong history of an infection on the back of his neck. He was on Keflex, but the swelling increased. CT scan of the neck reveals a developing abscess in the posterior aspect of the neck.  Past Medical History  Diagnosis Date  . Diabetes mellitus without complication   . Hypertension   . BPH (benign prostatic hyperplasia)   . CAD (coronary artery disease)   . High cholesterol   . GERD (gastroesophageal reflux disease)   . Gastric mass     EGD 9/15  . Pulmonary vascular congestion     per xray 12/15  . Cellulitis     12/15  . Chronic back pain     Past Surgical History  Procedure Laterality Date  . Cardiac surgery    . Cystoscopy N/A 12/12/2012    Procedure: CYSTOSCOPY FLEXIBLE;  Surgeon: Mohammad I Javaid, MD;  Location: [facility];  Service: Urology;  Laterality: N/A;  . Colonoscopy  2004    Dr. Rehman: three small polyps at cecum, path unknown, external hemorrhoids  . Coronary artery bypass graft      x5  . Cardiac catheterization      stents placed  . Colonoscopy N/A 08/16/2013    Procedure: COLONOSCOPY;  Surgeon: Robert M Rourk, MD;  Location: [facility];  Service: Endoscopy;  Laterality: N/A;  1:00  . Esophagogastroduodenoscopy N/A 08/16/2013    Procedure: ESOPHAGOGASTRODUODENOSCOPY (EGD);  Surgeon: Robert M Rourk, MD;  Location: [facility];  Service: Endoscopy;  Laterality: N/A;  . Maloney dilation N/A 08/16/2013    Procedure: MALONEY DILATION;  Surgeon: Robert M Rourk, MD;  Location: [facility];  Service: Endoscopy;  Laterality: N/A;  . Eus N/A 09/07/2013    Procedure: UPPER ENDOSCOPIC ULTRASOUND (EUS) LINEAR;  Surgeon: Daniel P Jacobs, MD;  Location: [facility];  Service: Endoscopy;  Laterality: N/A;    Family History  Problem Relation Age of Onset  . Colon cancer  Son 52    deceased    Social History:  reports that he quit smoking about 57 years ago. He does not have any smokeless tobacco history on file. He reports that he does not drink alcohol or use illicit drugs.  Allergies: No Known Allergies  Medications: I have reviewed the patient's current medications.  Results for orders placed or performed during the hospital encounter of 12/18/13 (from the past 48 hour(s))  CBG monitoring, ED     Status: Abnormal   Collection Time: 12/18/13  9:27 AM  Result Value Ref Range   Glucose-Capillary 135 (H) 70 - 99 mg/dL  CBC with Differential     Status: Abnormal   Collection Time: 12/18/13 10:08 AM  Result Value Ref Range   WBC 14.5 (H) 4.0 - 10.5 K/uL   RBC 4.80 4.22 - 5.81 MIL/uL   Hemoglobin 13.7 13.0 - 17.0 g/dL   HCT 40.9 39.0 - 52.0 %   MCV 85.2 78.0 - 100.0 fL   MCH 28.5 26.0 - 34.0 pg   MCHC 33.5 30.0 - 36.0 g/dL   RDW 12.3 11.5 - 15.5 %   Platelets 264 150 - 400 K/uL   Neutrophils Relative % 83 (H) 43 - 77 %   Neutro Abs 12.0 (H) 1.7 - 7.7 K/uL   Lymphocytes Relative 7 (L) 12 - 46 %     Lymphs Abs 1.0 0.7 - 4.0 K/uL   Monocytes Relative 9 3 - 12 %   Monocytes Absolute 1.3 (H) 0.1 - 1.0 K/uL   Eosinophils Relative 1 0 - 5 %   Eosinophils Absolute 0.1 0.0 - 0.7 K/uL   Basophils Relative 0 0 - 1 %   Basophils Absolute 0.0 0.0 - 0.1 K/uL  Comprehensive metabolic panel     Status: Abnormal   Collection Time: 12/18/13 10:08 AM  Result Value Ref Range   Sodium 139 137 - 147 mEq/L   Potassium 3.9 3.7 - 5.3 mEq/L   Chloride 100 96 - 112 mEq/L   CO2 29 19 - 32 mEq/L   Glucose, Bld 96 70 - 99 mg/dL   BUN 13 6 - 23 mg/dL   Creatinine, Ser 0.94 0.50 - 1.35 mg/dL   Calcium 10.3 8.4 - 10.5 mg/dL   Total Protein 7.1 6.0 - 8.3 g/dL   Albumin 3.1 (L) 3.5 - 5.2 g/dL   AST 10 0 - 37 U/L   ALT 10 0 - 53 U/L   Alkaline Phosphatase 99 39 - 117 U/L   Total Bilirubin 0.4 0.3 - 1.2 mg/dL   GFR calc non Af Amer 77 (L) >90 mL/min   GFR calc Af Amer 89  (L) >90 mL/min    Comment: (NOTE) The eGFR has been calculated using the CKD EPI equation. This calculation has not been validated in all clinical situations. eGFR's persistently <90 mL/min signify possible Chronic Kidney Disease.    Anion gap 10 5 - 15  Blood culture (routine x 2)     Status: None (Preliminary result)   Collection Time: 12/18/13 10:08 AM  Result Value Ref Range   Specimen Description Blood    Special Requests Normal    Culture NO GROWTH <24 HRS    Report Status PENDING   Blood culture (routine x 2)     Status: None (Preliminary result)   Collection Time: 12/18/13 10:08 AM  Result Value Ref Range   Specimen Description Blood    Special Requests Normal    Culture NO GROWTH <24 HRS    Report Status PENDING   Troponin I     Status: None   Collection Time: 12/18/13 10:08 AM  Result Value Ref Range   Troponin I <0.30 <0.30 ng/mL    Comment:        Due to the release kinetics of cTnI, a negative result within the first hours of the onset of symptoms does not rule out myocardial infarction with certainty. If myocardial infarction is still suspected, repeat the test at appropriate intervals.   Pro b natriuretic peptide (BNP)     Status: None   Collection Time: 12/18/13 10:08 AM  Result Value Ref Range   Pro B Natriuretic peptide (BNP) 239.9 0 - 450 pg/mL  Lipid panel     Status: Abnormal   Collection Time: 12/18/13 10:08 AM  Result Value Ref Range   Cholesterol 140 0 - 200 mg/dL   Triglycerides 142 <150 mg/dL   HDL 30 (L) >39 mg/dL   Total CHOL/HDL Ratio 4.7 RATIO   VLDL 28 0 - 40 mg/dL   LDL Cholesterol 82 0 - 99 mg/dL    Comment:        Total Cholesterol/HDL:CHD Risk Coronary Heart Disease Risk Table                     Men   Women  1/2 Average Risk  3.4   3.3  Average Risk       5.0   4.4  2 X Average Risk   9.6   7.1  3 X Average Risk  23.4   11.0        Use the calculated Patient Ratio above and the CHD Risk Table to determine the patient's CHD  Risk.        ATP III CLASSIFICATION (LDL):  <100     mg/dL   Optimal  100-129  mg/dL   Near or Above                    Optimal  130-159  mg/dL   Borderline  160-189  mg/dL   High  >190     mg/dL   Very High   Hemoglobin A1c     Status: Abnormal   Collection Time: 12/18/13 10:08 AM  Result Value Ref Range   Hgb A1c MFr Bld 10.0 (H) <5.7 %    Comment: (NOTE)                                                                       According to the ADA Clinical Practice Recommendations for 2011, when HbA1c is used as a screening test:  >=6.5%   Diagnostic of Diabetes Mellitus           (if abnormal result is confirmed) 5.7-6.4%   Increased risk of developing Diabetes Mellitus References:Diagnosis and Classification of Diabetes Mellitus,Diabetes QPYP,9509,32(IZTIW 1):S62-S69 and Standards of Medical Care in         Diabetes - 2011,Diabetes Care,2011,34 (Suppl 1):S11-S61.    Mean Plasma Glucose 240 (H) <117 mg/dL    Comment: Performed at [facility]  Troponin I     Status: None   Collection Time: 12/18/13  5:53 PM  Result Value Ref Range   Troponin I <0.30 <0.30 ng/mL    Comment:        Due to the release kinetics of cTnI, a negative result within the first hours of the onset of symptoms does not rule out myocardial infarction with certainty. If myocardial infarction is still suspected, repeat the test at appropriate intervals.   Glucose, capillary     Status: Abnormal   Collection Time: 12/18/13  6:04 PM  Result Value Ref Range   Glucose-Capillary 207 (H) 70 - 99 mg/dL   Comment 1 Documented in Chart    Comment 2 Notify RN   Glucose, capillary     Status: Abnormal   Collection Time: 12/18/13  9:02 PM  Result Value Ref Range   Glucose-Capillary 220 (H) 70 - 99 mg/dL   Comment 1 Notify RN   CBC     Status: Abnormal   Collection Time: 12/19/13  6:36 AM  Result Value Ref Range   WBC 14.2 (H) 4.0 - 10.5 K/uL   RBC 4.34 4.22 - 5.81 MIL/uL   Hemoglobin 12.4 (L) 13.0  - 17.0 g/dL   HCT 37.6 (L) 39.0 - 52.0 %   MCV 86.6 78.0 - 100.0 fL   MCH 28.6 26.0 - 34.0 pg   MCHC 33.0 30.0 - 36.0 g/dL   RDW 12.4 11.5 - 15.5 %   Platelets 258 150 -  400 K/uL  Troponin I     Status: None   Collection Time: 12/19/13  6:36 AM  Result Value Ref Range   Troponin I <0.30 <0.30 ng/mL    Comment:        Due to the release kinetics of cTnI, a negative result within the first hours of the onset of symptoms does not rule out myocardial infarction with certainty. If myocardial infarction is still suspected, repeat the test at appropriate intervals.   Basic metabolic panel     Status: Abnormal   Collection Time: 12/19/13  6:36 AM  Result Value Ref Range   Sodium 139 137 - 147 mEq/L   Potassium 4.4 3.7 - 5.3 mEq/L   Chloride 98 96 - 112 mEq/L   CO2 30 19 - 32 mEq/L   Glucose, Bld 279 (H) 70 - 99 mg/dL   BUN 14 6 - 23 mg/dL   Creatinine, Ser 1.13 0.50 - 1.35 mg/dL   Calcium 9.8 8.4 - 10.5 mg/dL   GFR calc non Af Amer 59 (L) >90 mL/min   GFR calc Af Amer 69 (L) >90 mL/min    Comment: (NOTE) The eGFR has been calculated using the CKD EPI equation. This calculation has not been validated in all clinical situations. eGFR's persistently <90 mL/min signify possible Chronic Kidney Disease.    Anion gap 11 5 - 15  Glucose, capillary     Status: Abnormal   Collection Time: 12/19/13  7:27 AM  Result Value Ref Range   Glucose-Capillary 231 (H) 70 - 99 mg/dL    Ct Soft Tissue Neck W Contrast  12/18/2013   CLINICAL DATA:  Swelling/boil right side of posterior neck marked by BB beginning 2 weeks ago. Diabetic.  EXAM: CT NECK WITH CONTRAST  TECHNIQUE: Multidetector CT imaging of the neck was performed using the standard protocol following the bolus administration of intravenous contrast.  CONTRAST:  75 mL Omnipaque 300 IV  COMPARISON:  None.  FINDINGS: The visualized orbits are normal and symmetric. Paranasal sinuses and mastoid air cells are clear. Spaces of the suprahyoid  neck are within normal. There is mild calcified plaque at the carotid bifurcations and proximal internal carotid arteries bilaterally. Airway and epiglottis are within normal.  In the area of clinical concern over the posterior right neck subcutaneous fat at the level of C1-C3 is a focal inflammatory/infectious process. There is moderate stranding of the subcutaneous fat with a somewhat poorly defined oval likely evolving fluid collection measuring approximately 1.5 x 2.8 x 3.7 cm in [facility], transverse and craniocaudal dimensions. This fluid collection abuts the deep layer of the skin, but is centered within the subcutaneous fat this inflammatory/infectious process does not involve the deeper paraspinal muscle compartment. No significant adenopathy.  Infrahyoid neck demonstrates several small thyroid nodules measuring up to 1.1 cm in size.  There is calcified plaque over the thoracic aorta. Visualized lung apices are within normal. There are mild degenerate changes of the spine with disc disease at the C5-6 level.  IMPRESSION: Evolving focal fluid collection within the subcutaneous fat over the right posterior neck corresponding to patient's clinical palpable abnormality and measuring approximately 1.5 x 2.8 x 3.7 cm in [facility], transverse and craniocaudal dimensions. Stranding of the adjacent fat. This likely represents a focal inflammatory/infectious process. This process abuts the deep third of the scan and is not involve the deeper muscle compartment.  Multiple thyroid nodules measuring up to 1.1 cm in size likely multinodular goiter. Recommend followup as clinically indicated.    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  Atherosclerotic disease of the carotid bifurcations and proximal internal carotid arteries bilaterally.   Electronically Signed   By: Marin Olp M.D.   On: 12/18/2013 14:02   Dg Chest Portable 1 View  12/18/2013   CLINICAL DATA:  Nausea, chest discomfort, weakness and dizziness  beginning 1 week ago, history diabetes, hypertension, coronary artery disease post CABG  EXAM: PORTABLE CHEST - 1 VIEW  COMPARISON:  Portable exam 1011 hr compared to 12/09/2012  FINDINGS: Enlargement of cardiac silhouette post CABG.  Pulmonary vascular congestion.  Mild bibasilar atelectasis.  LEFT perihilar infiltrate question infection versus asymmetric edema.  Remaining lungs clear.  No pleural effusion or pneumothorax.  IMPRESSION: Enlargement of cardiac silhouette post CABG.  Pulmonary vascular congestion with bibasilar atelectasis and LEFT perihilar infiltrate which could represent asymmetric edema or pneumonia.   Electronically Signed   By: Lavonia Dana M.D.   On: 12/18/2013 10:23    ROS: See chart Blood pressure 135/61, pulse 62, temperature 98.5 F (36.9 C), temperature source Oral, resp. rate 18, height 5' 11" (1.803 m), weight 86 kg (189 lb 9.5 oz), SpO2 95 %. Physical Exam: Pleasant white male in no acute distress. Neck examination reveals a 3-4 cm indurated area along the posterior left aspect of the neck. No drainage is present. Is tender to touch.  Assessment/Plan: Impression: Neck abscess Plan: Agree with IV Zosyn and vancomycin. We'll proceed with incision and drainage of the neck abscess tomorrow. The risks and benefits of the procedure were fully explained to the patient, who gave informed consent.  Danniell Rotundo A 12/19/2013, 8:17 AM

## 2013-12-19 NOTE — Consult Note (Signed)
Referring Provider: Dr. Cindie Laroche Primary Care Physician:  Maricela Curet, MD Primary Gastroenterologist:  Dr. [name]   Date of Admission: 12/18/13 Date of Consultation: 12/19/13  Reason for Consultation:  Dysphagia  HPI:  [name] is an 80 year old well-known to our office with history of dysphagia, nausea and vomiting, loss of appetite, and weight loss. Last seen as an outpatient in Aug 2015, undergoing EGD with normal esophagus s/p dilation, gastric erosions, negative H.pylori, submucosal gastric mass. 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 Weight in July 2015 211, now 189. Progressive weight loss.  States decreased appetite for the past 2 weeks. Felt nauseated. Food feels like it "doesn't want to go down". Throat is "sore". Everything tastes bitter. From Saturday to Monday only had 2 cans of ensure. This morning only coffee and orange juice. Tried to eat a bite of oatmeal and grits but "just couldn't go down". No abdominal pain. Nausea, underlying. Quit taking Protonix at home, states his tongue was peeling. Noted he had "white" on his tongue that he could scrape off. Tongue is sore. Started on Diflucan yesterday for presumed oral thrush.    Past Medical History  Diagnosis Date  . Diabetes mellitus without complication   . Hypertension   . BPH (benign prostatic hyperplasia)   . CAD (coronary artery disease)   . High cholesterol   . GERD (gastroesophageal reflux disease)   . Gastric mass     EGD 9/15  . Pulmonary vascular congestion     per xray 12/15  . Cellulitis     12/15  . Chronic back pain     Past Surgical History  Procedure Laterality Date  . Cardiac surgery    . Cystoscopy N/A 12/12/2012    Procedure: CYSTOSCOPY FLEXIBLE;  Surgeon: Marissa Nestle, MD;  Location: [facility];  Service: Urology;  Laterality: N/A;  . Colonoscopy  2004    Dr. Laural Golden: three small polyps at cecum, path unknown, external hemorrhoids  . Coronary artery bypass graft       x5  . Cardiac catheterization      stents placed  . Colonoscopy N/A 08/16/2013    Procedure: COLONOSCOPY;  Surgeon: Daneil Dolin, MD;  Location: [facility];  Service: Endoscopy;  Laterality: N/A;  1:00  . Esophagogastroduodenoscopy N/A 08/16/2013    Procedure: ESOPHAGOGASTRODUODENOSCOPY (EGD);  Surgeon: Daneil Dolin, MD;  Location: [facility];  Service: Endoscopy;  Laterality: N/A;  Venia Minks dilation N/A 08/16/2013    Procedure: Venia Minks DILATION;  Surgeon: Daneil Dolin, MD;  Location: [facility];  Service: Endoscopy;  Laterality: N/A;  . Eus N/A 09/07/2013    Procedure: UPPER ENDOSCOPIC ULTRASOUND (EUS) LINEAR;  Surgeon: Milus Banister, MD;  Location: [facility];  Service: Endoscopy;  Laterality: N/A;    Prior to Admission medications   Medication Sig Start Date End Date Taking? Authorizing Provider  amLODipine-olmesartan (AZOR) 5-40 MG per tablet Take 1 tablet by mouth daily.   Yes Historical Provider, MD  aspirin 325 MG EC tablet Take 325 mg by mouth daily.   Yes Historical Provider, MD  insulin aspart (NOVOLOG FLEXPEN) 100 UNIT/ML FlexPen Inject 20 Units into the skin 2 (two) times daily with a meal.   Yes Historical Provider, MD  Insulin Glargine (LANTUS SOLOSTAR) 100 UNIT/ML Solostar Pen Inject 40 Units into the skin at bedtime.   Yes Historical Provider, MD  oxyCODONE (ROXICODONE) 15 MG immediate release tablet Take 15-30 mg by mouth every 4 (four) hours as  needed for pain.   Yes Historical Provider, MD  pantoprazole (PROTONIX) 40 MG tablet Take 40 mg by mouth daily.   Yes Historical Provider, MD  rosuvastatin (CRESTOR) 10 MG tablet Take 10 mg by mouth daily.   Yes Historical Provider, MD  silodosin (RAPAFLO) 8 MG CAPS capsule Take 8 mg by mouth daily with breakfast.   Yes Historical Provider, MD    Current Facility-Administered Medications  Medication Dose Route Frequency Provider Last Rate Last Dose  . 0.9 %  sodium chloride infusion   Intravenous Continuous Radene Gunning, NP 50 mL/hr at 12/18/13 1817 50 mL/hr at 12/18/13 1817  . acetaminophen (TYLENOL) tablet 650 mg  650 mg Oral Q6H PRN Radene Gunning, NP       Or  . acetaminophen (TYLENOL) suppository 650 mg  650 mg Rectal Q6H PRN Radene Gunning, NP      . alum & mag hydroxide-simeth (MAALOX/MYLANTA) 200-200-20 MG/5ML suspension 30 mL  30 mL Oral Q6H PRN Radene Gunning, NP      . amLODipine (NORVASC) tablet 5 mg  5 mg Oral Daily Lezlie Octave Black, NP   5 mg at 12/18/13 1816   And  . irbesartan (AVAPRO) tablet 300 mg  300 mg Oral Daily Radene Gunning, NP   300 mg at 12/18/13 1816  . bisacodyl (DULCOLAX) suppository 10 mg  10 mg Rectal Daily PRN Radene Gunning, NP      . enoxaparin (LOVENOX) injection 40 mg  40 mg Subcutaneous Q24H Radene Gunning, NP   40 mg at 12/18/13 1817  . fluconazole (DIFLUCAN) IVPB 100 mg  100 mg Intravenous Q24H Rexene Alberts, MD      . Derrill Memo ON 12/20/2013] Influenza vac split quadrivalent PF (FLUARIX) injection 0.5 mL  0.5 mL Intramuscular Tomorrow-1000 Maricela Curet, MD      . insulin aspart (novoLOG) injection 0-15 Units  0-15 Units Subcutaneous TID WC Radene Gunning, NP   5 Units at 12/19/13 0805  . insulin aspart (novoLOG) injection 0-5 Units  0-5 Units Subcutaneous QHS Radene Gunning, NP   0 Units at 12/18/13 2200  . insulin glargine (LANTUS) injection 20 Units  20 Units Subcutaneous QHS Radene Gunning, NP   20 Units at 12/19/13 0000  . morphine 2 MG/ML injection 1 mg  1 mg Intravenous Q3H PRN Radene Gunning, NP      . nitroGLYCERIN (NITROSTAT) SL tablet 0.4 mg  0.4 mg Sublingual Q5 min PRN Rexene Alberts, MD      . ondansetron [facility]) tablet 4 mg  4 mg Oral Q6H PRN Radene Gunning, NP       Or  . ondansetron [facility]) injection 4 mg  4 mg Intravenous Q6H PRN Radene Gunning, NP      . oxyCODONE (Oxy IR/ROXICODONE) immediate release tablet 15 mg  15 mg Oral Q4H PRN Radene Gunning, NP   15 mg at 12/19/13 0830  . pantoprazole (PROTONIX) EC tablet 40 mg  40 mg Oral Daily Radene Gunning, NP   40  mg at 12/18/13 1816  . piperacillin-tazobactam (ZOSYN) IVPB 3.375 g  3.375 g Intravenous Q8H Lezlie Octave Black, NP   3.375 g at 12/19/13 0805  . [START ON 12/20/2013] pneumococcal 23 valent vaccine (PNU-IMMUNE) injection 0.5 mL  0.5 mL Intramuscular Tomorrow-1000 Maricela Curet, MD      . polyethylene glycol (MIRALAX / GLYCOLAX) packet 17 g  17 g Oral Daily PRN Lezlie Octave  Black, NP      . rosuvastatin (CRESTOR) tablet 10 mg  10 mg Oral Daily Radene Gunning, NP   10 mg at 12/18/13 1816  . sodium chloride 0.9 % injection 3 mL  3 mL Intravenous Q12H Lezlie Octave Black, NP   3 mL at 12/18/13 2200  . tamsulosin (FLOMAX) capsule 0.4 mg  0.4 mg Oral Daily Lezlie Octave Black, NP   0.4 mg at 12/18/13 1817  . traZODone (DESYREL) tablet 25 mg  25 mg Oral QHS PRN Radene Gunning, NP      . vancomycin (VANCOCIN) IVPB 1000 mg/200 mL premix  1,000 mg Intravenous Q12H Lezlie Octave Black, NP   1,000 mg at 12/19/13 0200    Allergies as of 12/18/2013  . (No Known Allergies)    Family History  Problem Relation Age of Onset  . Colon cancer Son 93    deceased    History   Social History  . Marital Status: Single    Spouse Name: N/A    Number of Children: N/A  . Years of Education: N/A   Occupational History  . Not on file.   Social History Main Topics  . Smoking status: Former Smoker -- 1.00 packs/day for 11 years    Quit date: 01/05/1956  . Smokeless tobacco: Not on file  . Alcohol Use: No  . Drug Use: No  . Sexual Activity: Not on file   Other Topics Concern  . Not on file   Social History Narrative   ** Merged History Encounter **        Review of Systems: Gen: Dsee HPI CV: Denies chest pain, heart palpitations, syncope, edema  Resp: Denies shortness of breath with rest, cough, wheezing GI: see HPI GU : Denies urinary burning, urinary frequency, urinary incontinence.  MS: +back pain Derm: Denies rash, itching, dry skin Psych: Denies depression, anxiety,confusion, or memory loss Heme: Denies  bruising, bleeding, and enlarged lymph nodes.  Physical Exam: Vital signs in last 24 hours: Temp:  [98.3 F (36.8 C)-99.6 F (37.6 C)] 98.5 F (36.9 C) (12/15 0526) Pulse Rate:  [61-69] 62 (12/15 0526) Resp:  [16-19] 18 (12/15 0526) BP: (125-175)/(58-73) 135/61 mmHg (12/15 0526) SpO2:  [95 %-100 %] 95 % (12/15 0526) Weight:  [189 lb 9.5 oz (86 kg)-190 lb (86.183 kg)] 189 lb 9.5 oz (86 kg) (12/15 0526) Last BM Date: 12/18/13 General:   Alert,  Well-developed, well-nourished, pleasant and cooperative in NAD Head:  Normocephalic and atraumatic. Eyes:  Sclera clear, no icterus.   Conjunctiva pink. Ears:  Normal auditory acuity. Nose:  No deformity, discharge,  or lesions. Mouth:  No deformity or lesions, dentition normal. Oral mucosa beefy red, no plaques noted Lungs:  Clear throughout to auscultation.   No wheezes, crackles, or rhonchi. No acute distress. Heart:  S1 S2 present, no murmurs appreciated Abdomen:  Soft, obese, rounded, large AP diameter. No palpable HSM, no ttp Rectal:  Deferred  Msk:  Symmetrical without gross deformities. Normal posture. Extremities:  Without edema. Neurologic:  Alert and  oriented x4;  grossly normal neurologically. Psych:  Alert and cooperative. Normal mood and affect.  Intake/Output from previous day: 12/14 0701 - 12/15 0700 In: 360 [P.O.:360] Out: 100 [Urine:100] Intake/Output this shift: Total I/O In: 240 [P.O.:240] Out: -   Lab Results:  Recent Labs  12/18/13 1008 12/19/13 0636  WBC 14.5* 14.2*  HGB 13.7 12.4*  HCT 40.9 37.6*  PLT 264 258   BMET  Recent Labs  12/18/13 1008 12/19/13 0636  NA 139 139  K 3.9 4.4  CL 100 98  CO2 29 30  GLUCOSE 96 279*  BUN 13 14  CREATININE 0.94 1.13  CALCIUM 10.3 9.8   LFT  Recent Labs  12/18/13 1008  PROT 7.1  ALBUMIN 3.1*  AST 10  ALT 10  ALKPHOS 99  BILITOT 0.4    Studies/Results: Ct Soft Tissue Neck W Contrast  12/18/2013   CLINICAL DATA:  Swelling/boil right side of  posterior neck marked by BB beginning 2 weeks ago. Diabetic.  EXAM: CT NECK WITH CONTRAST  TECHNIQUE: Multidetector CT imaging of the neck was performed using the standard protocol following the bolus administration of intravenous contrast.  CONTRAST:  75 mL Omnipaque 300 IV  COMPARISON:  None.  FINDINGS: The visualized orbits are normal and symmetric. Paranasal sinuses and mastoid air cells are clear. Spaces of the suprahyoid neck are within normal. There is mild calcified plaque at the carotid bifurcations and proximal internal carotid arteries bilaterally. Airway and epiglottis are within normal.  In the area of clinical concern over the posterior right neck subcutaneous fat at the level of C1-C3 is a focal inflammatory/infectious process. There is moderate stranding of the subcutaneous fat with a somewhat poorly defined oval likely evolving fluid collection measuring approximately 1.5 x 2.8 x 3.7 cm in AP, transverse and craniocaudal dimensions. This fluid collection abuts the deep layer of the skin, but is centered within the subcutaneous fat this inflammatory/infectious process does not involve the deeper paraspinal muscle compartment. No significant adenopathy.  Infrahyoid neck demonstrates several small thyroid nodules measuring up to 1.1 cm in size.  There is calcified plaque over the thoracic aorta. Visualized lung apices are within normal. There are mild degenerate changes of the spine with disc disease at the C5-6 level.  IMPRESSION: Evolving focal fluid collection within the subcutaneous fat over the right posterior neck corresponding to patient's clinical palpable abnormality and measuring approximately 1.5 x 2.8 x 3.7 cm in AP, transverse and craniocaudal dimensions. Stranding of the adjacent fat. This likely represents a focal inflammatory/infectious process. This process abuts the deep third of the scan and is not involve the deeper muscle compartment.  Multiple thyroid nodules measuring up to 1.1  cm in size likely multinodular goiter. Recommend followup as clinically indicated.  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  Atherosclerotic disease of the carotid bifurcations and proximal internal carotid arteries bilaterally.   Electronically Signed   By: Marin Olp M.D.   On: 12/18/2013 14:02   Dg Chest Portable 1 View  12/18/2013   CLINICAL DATA:  Nausea, chest discomfort, weakness and dizziness beginning 1 week ago, history diabetes, hypertension, coronary artery disease post CABG  EXAM: PORTABLE CHEST - 1 VIEW  COMPARISON:  Portable exam 1011 hr compared to 12/09/2012  FINDINGS: Enlargement of cardiac silhouette post CABG.  Pulmonary vascular congestion.  Mild bibasilar atelectasis.  LEFT perihilar infiltrate question infection versus asymmetric edema.  Remaining lungs clear.  No pleural effusion or pneumothorax.  IMPRESSION: Enlargement of cardiac silhouette post CABG.  Pulmonary vascular congestion with bibasilar atelectasis and LEFT perihilar infiltrate which could represent asymmetric edema or pneumonia.   Electronically Signed   By: Lavonia Dana M.D.   On: 12/18/2013 10:23    Impression: 80 year old male with persistent dysphagia, vague odynophagia, persistent nausea, and documented weight loss since at least July this year. EGD on file with empiric dilation of normal esophagus, gastric erosions, and likely  benign gastric lipoma after evaluation with EUS. CT abdomen in Aug 2015 without occult malignancy. Per history, notes white exudate after taking antibiotics recently, likely thrush but no evidence of this on exam today. Started on Diflucan per admitting service. UGI on file from July 2015 unremarkable. If no improvement over next 24 hours with empiric treatment of questionable candidiasis, would proceed with repeat BPE. Underlying nausea raises question of delayed gastric emptying in the setting of diabetes. Will provide symptomatic treatment with scheduled Zofran, PPI  BID. Consider GES if persistent nausea despite supportive care. Weight loss likely multifactorial in the setting of decreased oral intake. It is reassuring he has had both EGD, colonoscopy, and abdominal imaging recently. As of note, likely multinodular goiter on CT of neck: follow-up with primary physician regarding further evaluation. TSH ordered.    Plan: Increase PPI to BID Add Zofran scheduled with meals and bedtime Diflucan as already started Consider repeat BPE if no improvement in next 24 hours GES if persistent nausea despite supportive care Ensure supplements Will continue to follow with you   Orvil Feil, ANP-BC [facility] Gastroenterology      LOS: 1 day    12/19/2013, 9:20 AM

## 2013-12-19 NOTE — Progress Notes (Signed)
PHARMACIST - PHYSICIAN COMMUNICATION DR:   Cindie Laroche CONCERNING: Antibiotic IV to Oral Route Change Policy  RECOMMENDATION: This patient is receiving Diflucan by the intravenous route.  Based on criteria approved by the Pharmacy and Therapeutics Committee, the antibiotic(s) is/are being converted to the equivalent oral dose form(s).   DESCRIPTION: These criteria include:  Patient being treated for a respiratory tract infection, urinary tract infection, cellulitis or clostridium difficile associated diarrhea if on metronidazole  The patient is not neutropenic and does not exhibit a GI malabsorption state  The patient is eating (either orally or via tube) and/or has been taking other orally administered medications for a least 24 hours  The patient is improving clinically and has a Tmax < 100.5  If you have questions about this conversion, please contact the Pharmacy Department  [x]   [phone] )  [name] []   [phone] )  [name]  []   [phone] )  [facility] []   [phone] )  [place], PharmD, BCPS 12/19/2013@10 :59 AM

## 2013-12-20 ENCOUNTER — Inpatient Hospital Stay (HOSPITAL_COMMUNITY): Payer: PRIVATE HEALTH INSURANCE

## 2013-12-20 ENCOUNTER — Encounter (HOSPITAL_COMMUNITY): Payer: Self-pay | Admitting: *Deleted

## 2013-12-20 ENCOUNTER — Encounter (HOSPITAL_COMMUNITY)
Admission: EM | Disposition: A | Discharge status: Home / Self Care | Payer: Self-pay | Source: Home / Self Care | Attending: Family Medicine

## 2013-12-20 ENCOUNTER — Inpatient Hospital Stay (HOSPITAL_COMMUNITY): Payer: PRIVATE HEALTH INSURANCE | Admitting: Anesthesiology

## 2013-12-20 HISTORY — PX: INCISION AND DRAINAGE ABSCESS: SHX5864

## 2013-12-20 LAB — GLUCOSE, CAPILLARY
GLUCOSE-CAPILLARY: 119 mg/dL — AB (ref 70–99)
GLUCOSE-CAPILLARY: 120 mg/dL — AB (ref 70–99)
Glucose-Capillary: 166 mg/dL — ABNORMAL HIGH (ref 70–99)
Glucose-Capillary: 168 mg/dL — ABNORMAL HIGH (ref 70–99)
Glucose-Capillary: 185 mg/dL — ABNORMAL HIGH (ref 70–99)
Glucose-Capillary: 269 mg/dL — ABNORMAL HIGH (ref 70–99)

## 2013-12-20 LAB — LIPASE, BLOOD: Lipase: 14 U/L (ref 11–59)

## 2013-12-20 LAB — TSH: TSH: 0.285 u[IU]/mL — ABNORMAL LOW (ref 0.350–4.500)

## 2013-12-20 SURGERY — INCISION AND DRAINAGE, ABSCESS
Anesthesia: Monitor Anesthesia Care | Site: Neck

## 2013-12-20 MED ORDER — ONDANSETRON HCL 4 MG/2ML IJ SOLN
4.0000 mg | Freq: Once | INTRAMUSCULAR | Status: AC
Start: 2013-12-20 — End: 2013-12-20
  Administered 2013-12-20: 4 mg via INTRAVENOUS

## 2013-12-20 MED ORDER — ONDANSETRON HCL 4 MG/2ML IJ SOLN: 4.0000 mg | Freq: Once | INTRAMUSCULAR | Status: DC | PRN

## 2013-12-20 MED ORDER — FENTANYL CITRATE 0.05 MG/ML IJ SOLN
INTRAMUSCULAR | Status: AC
  Filled 2013-12-20: qty 2

## 2013-12-20 MED ORDER — LACTATED RINGERS IV SOLN
INTRAVENOUS | Status: DC
  Administered 2013-12-20: 09:00:00 via INTRAVENOUS

## 2013-12-20 MED ORDER — FENTANYL CITRATE 0.05 MG/ML IJ SOLN
INTRAMUSCULAR | Status: DC | PRN
  Administered 2013-12-20 (×2): 25 ug via INTRAVENOUS

## 2013-12-20 MED ORDER — BACITRACIN ZINC 500 UNIT/GM EX OINT
TOPICAL_OINTMENT | CUTANEOUS | Status: AC
  Filled 2013-12-20: qty 0.9

## 2013-12-20 MED ORDER — FENTANYL CITRATE 0.05 MG/ML IJ SOLN
25.0000 ug | Freq: Once | INTRAMUSCULAR | Status: AC
  Administered 2013-12-20: 25 ug via INTRAVENOUS

## 2013-12-20 MED ORDER — FENTANYL CITRATE 0.05 MG/ML IJ SOLN: 25.0000 ug | INTRAMUSCULAR | Status: DC | PRN

## 2013-12-20 MED ORDER — PROPOFOL INFUSION 10 MG/ML OPTIME
INTRAVENOUS | Status: DC | PRN
  Administered 2013-12-20: 75 ug/kg/min via INTRAVENOUS

## 2013-12-20 MED ORDER — MIDAZOLAM HCL 2 MG/2ML IJ SOLN
1.0000 mg | INTRAMUSCULAR | Status: DC | PRN
  Administered 2013-12-20: 2 mg via INTRAVENOUS

## 2013-12-20 MED ORDER — LIDOCAINE HCL (PF) 1 % IJ SOLN
INTRAMUSCULAR | Status: AC
  Filled 2013-12-20: qty 30

## 2013-12-20 MED ORDER — LIDOCAINE HCL (CARDIAC) 10 MG/ML IV SOLN
INTRAVENOUS | Status: DC | PRN
  Administered 2013-12-20: 10 mg via INTRAVENOUS

## 2013-12-20 MED ORDER — LIDOCAINE HCL (PF) 1 % IJ SOLN
INTRAMUSCULAR | Status: AC
  Filled 2013-12-20: qty 10

## 2013-12-20 MED ORDER — PROPOFOL 10 MG/ML IV BOLUS
INTRAVENOUS | Status: DC | PRN
  Administered 2013-12-20: 8.6 mg via INTRAVENOUS

## 2013-12-20 MED ORDER — ENOXAPARIN SODIUM 40 MG/0.4ML ~~LOC~~ SOLN
40.0000 mg | SUBCUTANEOUS | Status: DC
  Administered 2013-12-21: 40 mg via SUBCUTANEOUS
  Filled 2013-12-20: qty 0.4

## 2013-12-20 MED ORDER — LIDOCAINE HCL (PF) 1 % IJ SOLN
INTRAMUSCULAR | Status: DC | PRN
  Administered 2013-12-20: 4 mL

## 2013-12-20 MED ORDER — SODIUM CHLORIDE 0.9 % IR SOLN
Status: DC | PRN
  Administered 2013-12-20: 1000 mL

## 2013-12-20 MED ORDER — MIDAZOLAM HCL 2 MG/2ML IJ SOLN
INTRAMUSCULAR | Status: AC
  Filled 2013-12-20: qty 2

## 2013-12-20 MED ORDER — ONDANSETRON HCL 4 MG/2ML IJ SOLN
INTRAMUSCULAR | Status: AC
  Filled 2013-12-20: qty 2

## 2013-12-20 MED ORDER — PROPOFOL 10 MG/ML IV EMUL
INTRAVENOUS | Status: AC
  Filled 2013-12-20: qty 20

## 2013-12-20 SURGICAL SUPPLY — 28 items
BAG HAMPER (MISCELLANEOUS) ×3 IMPLANT
BNDG CONFORM 2 STRL LF (GAUZE/BANDAGES/DRESSINGS) ×3 IMPLANT
CHLORAPREP W/TINT 10.5 ML (MISCELLANEOUS) ×3 IMPLANT
CLOTH BEACON ORANGE TIMEOUT ST (SAFETY) ×3 IMPLANT
COVER LIGHT HANDLE STERIS (MISCELLANEOUS) ×6 IMPLANT
ELECT REM PT RETURN 9FT ADLT (ELECTROSURGICAL) ×3
ELECTRODE REM PT RTRN 9FT ADLT (ELECTROSURGICAL) ×1 IMPLANT
GAUZE IODOFORM PACK 1/2 7832 (GAUZE/BANDAGES/DRESSINGS) ×3 IMPLANT
GLOVE BIOGEL M STRL SZ7.5 (GLOVE) ×3 IMPLANT
GLOVE BIOGEL PI IND STRL 7.0 (GLOVE) ×1 IMPLANT
GLOVE BIOGEL PI INDICATOR 7.0 (GLOVE) ×2
GLOVE EXAM NITRILE LRG STRL (GLOVE) ×3 IMPLANT
GLOVE OPTIFIT SS 6.5 STRL BRWN (GLOVE) ×3 IMPLANT
GLOVE SURG SS PI 7.5 STRL IVOR (GLOVE) ×3 IMPLANT
GOWN STRL REUS W/TWL LRG LVL3 (GOWN DISPOSABLE) ×6 IMPLANT
KIT ROOM TURNOVER APOR (KITS) ×3 IMPLANT
MANIFOLD NEPTUNE II (INSTRUMENTS) ×3 IMPLANT
MARKER SKIN DUAL TIP RULER LAB (MISCELLANEOUS) ×3 IMPLANT
NEEDLE HYPO 25X1 1.5 SAFETY (NEEDLE) ×3 IMPLANT
NS IRRIG 1000ML POUR BTL (IV SOLUTION) ×3 IMPLANT
PACK MINOR (CUSTOM PROCEDURE TRAY) ×3 IMPLANT
PAD ARMBOARD 7.5X6 YLW CONV (MISCELLANEOUS) ×3 IMPLANT
SET BASIN LINEN APH (SET/KITS/TRAYS/PACK) ×3 IMPLANT
SPONGE GAUZE 4X4 12PLY (GAUZE/BANDAGES/DRESSINGS) ×3 IMPLANT
SWAB CULTURE LIQ STUART DBL (MISCELLANEOUS) ×3 IMPLANT
SYR BULB IRRIGATION 50ML (SYRINGE) ×3 IMPLANT
TAPE MEDIFIX FOAM 3 (GAUZE/BANDAGES/DRESSINGS) ×3 IMPLANT
TUBE ANAEROBIC PORT A CUL  W/M (MISCELLANEOUS) ×3 IMPLANT

## 2013-12-20 NOTE — Progress Notes (Signed)
Patient undergoing I&D this morning with Dr. Arnoldo Morale. Will reassess tomorrow morning and proceed with BPE as planned.

## 2013-12-20 NOTE — Progress Notes (Signed)
   12/20/13 0817  OBSTRUCTIVE SLEEP APNEA  Have you ever been diagnosed with sleep apnea through a sleep study? No  Do you snore loudly (loud enough to be heard through closed doors)?  0  Do you often feel tired, fatigued, or sleepy during the daytime? 1  Has anyone observed you stop breathing during your sleep? 0  Do you have, or are you being treated for high blood pressure? 1  BMI more than 35 kg/m2? 0  Age over 78 years old? 1  Neck circumference greater than 40 cm/16 inches? 1  Gender: 1  Obstructive Sleep Apnea Score 5

## 2013-12-20 NOTE — Anesthesia Preprocedure Evaluation (Signed)
Anesthesia Evaluation  Patient identified by MRN, date of birth, ID band Patient awake    Reviewed: Allergy & Precautions, H&P , NPO status , Patient's Chart, lab work & pertinent test results  Airway Mallampati: II  TM Distance: >3 FB Neck ROM: Full    Dental  (+) Edentulous Upper, Edentulous Lower   Pulmonary shortness of breath, sleep apnea , former smoker,  breath sounds clear to auscultation  Pulmonary exam normal       Cardiovascular hypertension, Pt. on medications + CAD negative cardio ROS  Rhythm:Regular Rate:Normal     Neuro/Psych negative neurological ROS  negative psych ROS   GI/Hepatic Neg liver ROS, GERD-  Medicated,  Endo/Other  diabetes, Type 2, Insulin Dependent  Renal/GU negative Renal ROS  negative genitourinary   Musculoskeletal negative musculoskeletal ROS (+)   Abdominal   Peds negative pediatric ROS (+)  Hematology negative hematology ROS (+)   Anesthesia Other Findings   Reproductive/Obstetrics negative OB ROS                             Anesthesia Physical Anesthesia Plan  ASA: III  Anesthesia Plan: MAC   Post-op Pain Management:    Induction: Intravenous  Airway Management Planned: Simple Face Mask  Additional Equipment:   Intra-op Plan:   Post-operative Plan:   Informed Consent: I have reviewed the patients History and Physical, chart, labs and discussed the procedure including the risks, benefits and alternatives for the proposed anesthesia with the patient or authorized representative who has indicated his/her understanding and acceptance.     Plan Discussed with:   Anesthesia Plan Comments:         Anesthesia Quick Evaluation

## 2013-12-20 NOTE — Op Note (Signed)
Patient:  [name]  DOB:  [birth year]  MRN:  [id number]   Preop Diagnosis:  Abscess, neck  Postop Diagnosis:  Same  Procedure:  Incision and drainage of abscess, neck  Surgeon:  Aviva Signs, M.D.  Anes:  Mac  Indications:  Patient is an 80 year old white male who presents with a maturing abscess in the right posterior neck. The risks and benefits of the procedure were fully explained to the patient, who gave informed consent.  Procedure note:  The patient was placed left lateral decub was position. Monitored anesthesia care was given. Surgical site confirmation was performed.  1% Xylocaine was used for local anesthesia.  An indurated erythematous area which was approximately 4 cm in its greatest diameter was noted in the right posterior inferior neck area. An incision was made over this area. The dissection was taken down to the subcutaneous tissue. Purulent fluid was found. Aerobic and anaerobic cultures were taken and sent to microbiology. The wound was irrigated with normal saline. The wound was then packed with iodoform Nu Gauze.  All tape and needle counts were correct at the end of the procedure. The patient was transferred to PACU in stable condition.  Complications:  None  EBL:  Minimal  Specimen:  Aerobic and anaerobic cultures of neck abscess

## 2013-12-20 NOTE — Anesthesia Postprocedure Evaluation (Signed)
  Anesthesia Post-op Note  Patient: [name]  Procedure(s) Performed: Procedure(s): INCISION AND DRAINAGE ABSCESS NECK (N/A)  Patient Location: PACU  Anesthesia Type:MAC  Level of Consciousness: awake, alert  and patient cooperative  Airway and Oxygen Therapy: Patient Spontanous Breathing and Patient connected to nasal cannula oxygen  Post-op Pain: none  Post-op Assessment: Post-op Vital signs reviewed, Patient's Cardiovascular Status Stable, Respiratory Function Stable, Patent Airway and No signs of Nausea or vomiting  Post-op Vital Signs: Reviewed and stable  Last Vitals:  Filed Vitals:   12/20/13 1100  BP: 123/56  Pulse: 66  Temp: 36.6 C  Resp: 15    Complications: No apparent anesthesia complications

## 2013-12-20 NOTE — Anesthesia Procedure Notes (Signed)
Procedure Name: MAC Date/Time: 12/20/2013 10:23 AM Performed by: Vista Deck Pre-anesthesia Checklist: Patient identified, Emergency Drugs available, Suction available, Timeout performed and Patient being monitored Patient Re-evaluated:Patient Re-evaluated prior to inductionOxygen Delivery Method: Non-rebreather mask

## 2013-12-20 NOTE — Transfer of Care (Signed)
Immediate Anesthesia Transfer of Care Note  Patient: [name]  Procedure(s) Performed: Procedure(s): INCISION AND DRAINAGE ABSCESS NECK (N/A)  Patient Location: PACU  Anesthesia Type:MAC  Level of Consciousness: awake and patient cooperative  Airway & Oxygen Therapy: Patient Spontanous Breathing and Patient connected to nasal cannula oxygen  Post-op Assessment: Report given to PACU RN, Post -op Vital signs reviewed and stable and Patient moving all extremities  Post vital signs: Reviewed and stable  Complications: No apparent anesthesia complications

## 2013-12-20 NOTE — Progress Notes (Signed)
Appreciate surgical and GI expertise PACEY ALTIZER IDP:[id number] DOB: November 06, 1931 DOA: 12/18/2013 PCP: Maricela Curet, MD             Physical Exam: Blood pressure 130/61, pulse 63, temperature 98.5 F (36.9 C), temperature source Oral, resp. rate 18, height 5\' 11"  (1.803 m), weight 190 lb (86.183 kg), SpO2 92 %. lungs no rales wheeze rhonchi appreciable heart regular rhythm no*for measles rubs abdomen soft nontender bowel sounds normoactive   Investigations:  Recent Results (from the past 240 hour(s))  Blood culture (routine x 2)     Status: None (Preliminary result)   Collection Time: 12/18/13 10:01 AM  Result Value Ref Range Status   Specimen Description BLOOD RIGHT FOREARM  Final   Special Requests BOTTLES DRAWN AEROBIC AND ANAEROBIC 6CC  Final   Culture NO GROWTH 1 DAY  Final   Report Status PENDING  Incomplete  Blood culture (routine x 2)     Status: None (Preliminary result)   Collection Time: 12/18/13 10:05 AM  Result Value Ref Range Status   Specimen Description BLOOD RIGHT WRIST DRAWN BY RN [facility]  Final   Special Requests BOTTLES DRAWN AEROBIC AND ANAEROBIC 6CC  Final   Culture NO GROWTH 1 DAY  Final   Report Status PENDING  Incomplete  Surgical pcr screen     Status: None   Collection Time: 12/19/13  7:31 PM  Result Value Ref Range Status   MRSA, PCR NEGATIVE NEGATIVE Final   Staphylococcus aureus NEGATIVE NEGATIVE Final    Comment:        The Xpert SA Assay (FDA approved for NASAL specimens in patients over 78 years of age), is one component of a comprehensive surveillance program.  Test performance has been validated by [employer] for patients greater than or equal to 78 year old. It is not intended to diagnose infection nor to guide or monitor treatment.      Basic Metabolic Panel:  Recent Labs  12/18/13 1008 12/19/13 0636  NA 139 139  K 3.9 4.4  CL 100 98  CO2 29 30  GLUCOSE 96 279*  BUN 13 14  CREATININE 0.94 1.13  CALCIUM 10.3  9.8   Liver Function Tests:  Recent Labs  12/18/13 1008  AST 10  ALT 10  ALKPHOS 99  BILITOT 0.4  PROT 7.1  ALBUMIN 3.1*     CBC:  Recent Labs  12/18/13 1008 12/19/13 0636  WBC 14.5* 14.2*  NEUTROABS 12.0*  --   HGB 13.7 12.4*  HCT 40.9 37.6*  MCV 85.2 86.6  PLT 264 258    Ct Soft Tissue Neck W Contrast  12/18/2013   CLINICAL DATA:  Swelling/boil right side of posterior neck marked by BB beginning 2 weeks ago. Diabetic.  EXAM: CT NECK WITH CONTRAST  TECHNIQUE: Multidetector CT imaging of the neck was performed using the standard protocol following the bolus administration of intravenous contrast.  CONTRAST:  75 mL Omnipaque 300 IV  COMPARISON:  None.  FINDINGS: The visualized orbits are normal and symmetric. Paranasal sinuses and mastoid air cells are clear. Spaces of the suprahyoid neck are within normal. There is mild calcified plaque at the carotid bifurcations and proximal internal carotid arteries bilaterally. Airway and epiglottis are within normal.  In the area of clinical concern over the posterior right neck subcutaneous fat at the level of C1-C3 is a focal inflammatory/infectious process. There is moderate stranding of the subcutaneous fat with a somewhat poorly defined oval likely evolving fluid collection  measuring approximately 1.5 x 2.8 x 3.7 cm in AP, transverse and craniocaudal dimensions. This fluid collection abuts the deep layer of the skin, but is centered within the subcutaneous fat this inflammatory/infectious process does not involve the deeper paraspinal muscle compartment. No significant adenopathy.  Infrahyoid neck demonstrates several small thyroid nodules measuring up to 1.1 cm in size.  There is calcified plaque over the thoracic aorta. Visualized lung apices are within normal. There are mild degenerate changes of the spine with disc disease at the C5-6 level.  IMPRESSION: Evolving focal fluid collection within the subcutaneous fat over the right posterior  neck corresponding to patient's clinical palpable abnormality and measuring approximately 1.5 x 2.8 x 3.7 cm in AP, transverse and craniocaudal dimensions. Stranding of the adjacent fat. This likely represents a focal inflammatory/infectious process. This process abuts the deep third of the scan and is not involve the deeper muscle compartment.  Multiple thyroid nodules measuring up to 1.1 cm in size likely multinodular goiter. Recommend followup as clinically indicated.  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  Atherosclerotic disease of the carotid bifurcations and proximal internal carotid arteries bilaterally.   Electronically Signed   By: Marin Olp M.D.   On: 12/18/2013 14:02   Dg Chest Portable 1 View  12/18/2013   CLINICAL DATA:  Nausea, chest discomfort, weakness and dizziness beginning 1 week ago, history diabetes, hypertension, coronary artery disease post CABG  EXAM: PORTABLE CHEST - 1 VIEW  COMPARISON:  Portable exam 1011 hr compared to 12/09/2012  FINDINGS: Enlargement of cardiac silhouette post CABG.  Pulmonary vascular congestion.  Mild bibasilar atelectasis.  LEFT perihilar infiltrate question infection versus asymmetric edema.  Remaining lungs clear.  No pleural effusion or pneumothorax.  IMPRESSION: Enlargement of cardiac silhouette post CABG.  Pulmonary vascular congestion with bibasilar atelectasis and LEFT perihilar infiltrate which could represent asymmetric edema or pneumonia.   Electronically Signed   By: Lavonia Dana M.D.   On: 12/18/2013 10:23      Medication  Impression: Insulin-dependent diabetes  Principal Problem:   Cellulitis and abscess Active Problems:   Essential hypertension   Obesity (BMI 30-39.9): BMI 31.3   Nausea and vomiting   Thrush, oral   Chest pain at rest   Leukocytosis   Diabetes   Chronic back pain   Pulmonary vascular congestion   Dysphagia   Odynophagia     Plan: Probable IND of neck abscess today continue dual  antibiotics Glucerna shakes, PPI twice a day  Consultants: General surgery and gastroenterology   Procedures IND of abscess today   Antibiotics: Vancomycin and Zosyn                  Code Status: Full   Family Communication:    Disposition Plan   Time spent: 30 minutes   LOS: 2 days   Winifred Balogh M   12/20/2013, 7:07 AM

## 2013-12-21 ENCOUNTER — Inpatient Hospital Stay (HOSPITAL_COMMUNITY): Payer: PRIVATE HEALTH INSURANCE

## 2013-12-21 ENCOUNTER — Encounter (HOSPITAL_COMMUNITY): Payer: Self-pay | Admitting: General Surgery

## 2013-12-21 DIAGNOSIS — R634 Abnormal weight loss: Secondary | ICD-10-CM | POA: Insufficient documentation

## 2013-12-21 LAB — BASIC METABOLIC PANEL
Anion gap: 9 (ref 5–15)
BUN: 11 mg/dL (ref 6–23)
CALCIUM: 9.6 mg/dL (ref 8.4–10.5)
CO2: 31 mEq/L (ref 19–32)
CREATININE: 1.44 mg/dL — AB (ref 0.50–1.35)
Chloride: 99 mEq/L (ref 96–112)
GFR calc Af Amer: 51 mL/min — ABNORMAL LOW (ref >90)
GFR calc non Af Amer: 44 mL/min — ABNORMAL LOW (ref >90)
GLUCOSE: 186 mg/dL — AB (ref 70–99)
Potassium: 4 mEq/L (ref 3.7–5.3)
Sodium: 139 mEq/L (ref 137–147)

## 2013-12-21 LAB — CBC
HEMATOCRIT: 37.3 % — AB (ref 39.0–52.0)
Hemoglobin: 12.1 g/dL — ABNORMAL LOW (ref 13.0–17.0)
MCH: 28.7 pg (ref 26.0–34.0)
MCHC: 32.4 g/dL (ref 30.0–36.0)
MCV: 88.4 fL (ref 78.0–100.0)
PLATELETS: 274 10*3/uL (ref 150–400)
RBC: 4.22 MIL/uL (ref 4.22–5.81)
RDW: 12.4 % (ref 11.5–15.5)
WBC: 12.5 10*3/uL — ABNORMAL HIGH (ref 4.0–10.5)

## 2013-12-21 LAB — GLUCOSE, CAPILLARY
GLUCOSE-CAPILLARY: 161 mg/dL — AB (ref 70–99)
Glucose-Capillary: 189 mg/dL — ABNORMAL HIGH (ref 70–99)
Glucose-Capillary: 137 mg/dL — ABNORMAL HIGH (ref 70–99)
Glucose-Capillary: 152 mg/dL — ABNORMAL HIGH (ref 70–99)

## 2013-12-21 LAB — LIPASE, BLOOD: Lipase: 16 U/L (ref 11–59)

## 2013-12-21 MED ORDER — CETYLPYRIDINIUM CHLORIDE 0.05 % MT LIQD
7.0000 mL | Freq: Two times a day (BID) | OROMUCOSAL | Status: DC
  Administered 2013-12-21 – 2013-12-22 (×2): 7 mL via OROMUCOSAL

## 2013-12-21 NOTE — Progress Notes (Signed)
UR chart review completed.  

## 2013-12-21 NOTE — Progress Notes (Signed)
Patient continued with mild [name] 5 she does fascia and inability to get food down  Follow results noted with age related dysmotility [name] GEX:[id number] DOB: [birth year] DOA: 12/18/2013 PCP: Phillip Curet, MD             Physical Exam: Blood pressure 144/56, pulse 59, temperature 97.9 F (36.6 C), temperature source Oral, resp. rate 16, height 5\' 11"  (1.803 m), weight 189 lb 9.5 oz (86 kg), SpO2 93 %. neck no JVD no carotid bruit no thyromegaly lungs clear to A&P no rashes Y or rhonchi heart regular rhythm no S3 or S4 no heaves thrills rubs soft nontender bowel sounds normoactive no guarding or rebound masses no megaly   Investigations:  Recent Results (from the past 240 hour(s))  Blood culture (routine x 2)     Status: None (Preliminary result)   Collection Time: 12/18/13 10:01 AM  Result Value Ref Range Status   Specimen Description BLOOD RIGHT FOREARM  Final   Special Requests BOTTLES DRAWN AEROBIC AND ANAEROBIC 6CC  Final   Culture NO GROWTH 3 DAYS  Final   Report Status PENDING  Incomplete  Blood culture (routine x 2)     Status: None (Preliminary result)   Collection Time: 12/18/13 10:05 AM  Result Value Ref Range Status   Specimen Description BLOOD RIGHT WRIST DRAWN BY RN [facility]  Final   Special Requests BOTTLES DRAWN AEROBIC AND ANAEROBIC 6CC  Final   Culture NO GROWTH 3 DAYS  Final   Report Status PENDING  Incomplete  Surgical pcr screen     Status: None   Collection Time: 12/19/13  7:31 PM  Result Value Ref Range Status   MRSA, PCR NEGATIVE NEGATIVE Final   Staphylococcus aureus NEGATIVE NEGATIVE Final    Comment:        The Xpert SA Assay (FDA approved for NASAL specimens in patients over 26 years of age), is one component of a comprehensive surveillance program.  Test performance has been validated by [employer] for patients greater than or equal to 74 year old. It is not intended to diagnose infection nor to guide or monitor  treatment.   Anaerobic culture     Status: None (Preliminary result)   Collection Time: 12/20/13 10:44 AM  Result Value Ref Range Status   Specimen Description ABSCESS NECK  Final   Special Requests Antibiotic Treatment: Zosyn 3.375g  Final   Gram Stain   Final    FEW WBC PRESENT,BOTH PMN AND MONONUCLEAR NO SQUAMOUS EPITHELIAL CELLS SEEN RARE GRAM POSITIVE COCCI IN PAIRS IN CLUSTERS Performed at [facility]    Culture   Final    NO ANAEROBES ISOLATED; CULTURE IN PROGRESS FOR 5 DAYS Performed at [facility]    Report Status PENDING  Incomplete  Culture, routine-abscess     Status: None (Preliminary result)   Collection Time: 12/20/13 10:44 AM  Result Value Ref Range Status   Specimen Description ABSCESS NECK  Final   Special Requests Antibiotic Treatment: Zosyn 3.375g  Final   Gram Stain   Final    FEW WBC PRESENT,BOTH PMN AND MONONUCLEAR NO SQUAMOUS EPITHELIAL CELLS SEEN RARE GRAM POSITIVE COCCI IN PAIRS IN CLUSTERS Performed at [facility]    Culture   Final    Culture reincubated for better growth Performed at [facility]    Report Status PENDING  Incomplete     Basic Metabolic Panel:  Recent Labs  12/19/13 0636 12/21/13 0630  NA  139 139  K 4.4 4.0  CL 98 99  CO2 30 31  GLUCOSE 279* 186*  BUN 14 11  CREATININE 1.13 1.44*  CALCIUM 9.8 9.6   Liver Function Tests: No results for input(s): AST, ALT, ALKPHOS, BILITOT, PROT, ALBUMIN in the last 72 hours.   CBC:  Recent Labs  12/19/13 0636 12/21/13 0630  WBC 14.2* 12.5*  HGB 12.4* 12.1*  HCT 37.6* 37.3*  MCV 86.6 88.4  PLT 258 274    Dg Esophagus  12/21/2013   CLINICAL DATA:  Dysphagia, choking, odynophagia, nausea, weight loss ; history hypertension, diabetes  EXAM: ESOPHOGRAM/BARIUM SWALLOW  TECHNIQUE: Single contrast examination was performed using thin barium. Patient swallowed a 12.5 mm diameter barium tablet.  FLUOROSCOPY TIME:  2 min 42 seconds  COMPARISON:   None  FINDINGS: Normal esophageal distention.  No esophageal mass or stricture.  12.5 mm diameter barium tablet passed from oral cavity to stomach without obstruction; trans the plate the gastroesophageal junction was noted, passing beyond with several additional swallows of water and barium.  Air contrast imaging unable to be performed due to patient condition.  No gross areas of mucosal irregularity, stricture, or persistent intraluminal filling defects identified.  Diffuse age-related esophageal dysmotility.  Postsurgical changes of CABG noted.  Atherosclerotic calcification aorta.  IMPRESSION: Age-related esophageal dysmotility.  No gross evidence of esophageal mass or stricture.   Electronically Signed   By: Lavonia Dana M.D.   On: 12/21/2013 11:04      Medications:   Impression: Esophageal dysmotility presbyesophagus  Principal Problem:   Cellulitis and abscess Active Problems:   Essential hypertension   Obesity (BMI 30-39.9): BMI 31.3   Nausea and vomiting   Thrush, oral   Chest pain at rest   Leukocytosis   Diabetes   Chronic back pain   Pulmonary vascular congestion   Dysphagia   Odynophagia   Abnormal weight loss     Plan: PPI twice a day advance and monitor diet  Consultants:    Procedures IND of neck abscess   Antibiotics:                   Code Status:   Family Communication:    Disposition Plan as per GI and surgery  Time spent: 30 minutes   LOS: 3 days   [name] M   12/21/2013, 1:17 PM

## 2013-12-21 NOTE — Progress Notes (Addendum)
Subjective:  Feels nauseated this morning. Couldn't eat last night due to nausea. Just can't get it to go down. Denies abdominal pain.   Objective: Vital signs in last 24 hours: Temp:  [97.7 F (36.5 C)-98.3 F (36.8 C)] 97.9 F (36.6 C) (12/17 0550) Pulse Rate:  [56-69] 59 (12/17 0550) Resp:  [10-28] 16 (12/17 0550) BP: (118-158)/(54-79) 144/56 mmHg (12/17 0550) SpO2:  [84 %-100 %] 93 % (12/17 0550) Weight:  [189 lb 9.5 oz (86 kg)] 189 lb 9.5 oz (86 kg) (12/17 0550) Last BM Date: 12/19/13 General:   Alert,  Well-developed, well-nourished, pleasant and cooperative in NAD Head:  Normocephalic and atraumatic. Eyes:  Sclera clear, no icterus.  Abdomen:  Soft, nontender and nondistended.  Normal bowel sounds, without guarding, and without rebound.   Extremities:  Without clubbing, deformity or edema. Neurologic:  Alert and  oriented x4;  grossly normal neurologically. Skin:  Intact without significant lesions or rashes. Psych:  Alert and cooperative. Normal mood and affect.  Intake/Output from previous day: 12/16 0701 - 12/17 0700 In: 840 [P.O.:240; I.V.:300; IV Piggyback:300] Out: -  Intake/Output this shift:    Lab Results: CBC  Recent Labs  12/18/13 1008 12/19/13 0636 12/21/13 0630  WBC 14.5* 14.2* 12.5*  HGB 13.7 12.4* 12.1*  HCT 40.9 37.6* 37.3*  MCV 85.2 86.6 88.4  PLT 264 258 274   BMET  Recent Labs  12/18/13 1008 12/19/13 0636 12/21/13 0630  NA 139 139 139  K 3.9 4.4 4.0  CL 100 98 99  CO2 29 30 31   GLUCOSE 96 279* 186*  BUN 13 14 11   CREATININE 0.94 1.13 1.44*  CALCIUM 10.3 9.8 9.6   LFTs  Recent Labs  12/18/13 1008  BILITOT 0.4  ALKPHOS 99  AST 10  ALT 10  PROT 7.1  ALBUMIN 3.1*    Recent Labs  12/20/13 0635 12/21/13 0630  LIPASE 14 16   PT/INR No results for input(s): LABPROT, INR in the last 72 hours.    Imaging Studies: Ct Soft Tissue Neck W Contrast  12/18/2013   CLINICAL DATA:  Swelling/boil right side of posterior neck  marked by BB beginning 2 weeks ago. Diabetic.  EXAM: CT NECK WITH CONTRAST  TECHNIQUE: Multidetector CT imaging of the neck was performed using the standard protocol following the bolus administration of intravenous contrast.  CONTRAST:  75 mL Omnipaque 300 IV  COMPARISON:  None.  FINDINGS: The visualized orbits are normal and symmetric. Paranasal sinuses and mastoid air cells are clear. Spaces of the suprahyoid neck are within normal. There is mild calcified plaque at the carotid bifurcations and proximal internal carotid arteries bilaterally. Airway and epiglottis are within normal.  In the area of clinical concern over the posterior right neck subcutaneous fat at the level of C1-C3 is a focal inflammatory/infectious process. There is moderate stranding of the subcutaneous fat with a somewhat poorly defined oval likely evolving fluid collection measuring approximately 1.5 x 2.8 x 3.7 cm in AP, transverse and craniocaudal dimensions. This fluid collection abuts the deep layer of the skin, but is centered within the subcutaneous fat this inflammatory/infectious process does not involve the deeper paraspinal muscle compartment. No significant adenopathy.  Infrahyoid neck demonstrates several small thyroid nodules measuring up to 1.1 cm in size.  There is calcified plaque over the thoracic aorta. Visualized lung apices are within normal. There are mild degenerate changes of the spine with disc disease at the C5-6 level.  IMPRESSION: Evolving focal fluid collection within the  subcutaneous fat over the right posterior neck corresponding to patient's clinical palpable abnormality and measuring approximately 1.5 x 2.8 x 3.7 cm in AP, transverse and craniocaudal dimensions. Stranding of the adjacent fat. This likely represents a focal inflammatory/infectious process. This process abuts the deep third of the scan and is not involve the deeper muscle compartment.  Multiple thyroid nodules measuring up to 1.1 cm in size  likely multinodular goiter. Recommend followup as clinically indicated.  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  Atherosclerotic disease of the carotid bifurcations and proximal internal carotid arteries bilaterally.   Electronically Signed   By: Marin Olp M.D.   On: 12/18/2013 14:02   Dg Chest Portable 1 View  12/18/2013   CLINICAL DATA:  Nausea, chest discomfort, weakness and dizziness beginning 1 week ago, history diabetes, hypertension, coronary artery disease post CABG  EXAM: PORTABLE CHEST - 1 VIEW  COMPARISON:  Portable exam 1011 hr compared to 12/09/2012  FINDINGS: Enlargement of cardiac silhouette post CABG.  Pulmonary vascular congestion.  Mild bibasilar atelectasis.  LEFT perihilar infiltrate question infection versus asymmetric edema.  Remaining lungs clear.  No pleural effusion or pneumothorax.  IMPRESSION: Enlargement of cardiac silhouette post CABG.  Pulmonary vascular congestion with bibasilar atelectasis and LEFT perihilar infiltrate which could represent asymmetric edema or pneumonia.   Electronically Signed   By: Lavonia Dana M.D.   On: 12/18/2013 10:23  [2 weeks]   Assessment: 80 year old male with persistent dysphagia, vague odynophagia, persistent nausea, and documented weight loss since at least July this year. 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 CT abdomen in Aug 2015 without occult malignancy. Per history, notes white exudate after taking antibiotics recently, likely thrush but no evidence of this on exam today. Started on Diflucan per admitting service. UGI on file from July 2015 unremarkable.  Plans for BPE today. Underlying nausea raises question of delayed gastric emptying in the setting of diabetes. Will provide symptomatic treatment with scheduled Zofran, PPI BID. Consider GES if persistent nausea despite supportive care. Weight loss likely multifactorial  in the setting of decreased oral intake. It is reassuring he has had both EGD, colonoscopy, and abdominal imaging recently. As of note, likely multinodular goiter on CT of neck: follow-up with primary physician regarding further evaluation. TSH ordered.    Plan: Follow up pending BPE as available.   LOS: 3 days   Neil Crouch  12/21/2013, 8:11 AM  Attending note:  BPE reveals dysmotility but really no obstruction. No narcotics for back pain so far today. Still nauseated. We have an opportunity to get a gastric emptying baseline. We'll go ahead and order a GES for tomorrow morning. No narcotics until after test performed .  Discussed with nursing staff and patient.

## 2013-12-21 NOTE — Progress Notes (Signed)
Neck dressing removed. Wound care orders written. Cultures pending.

## 2013-12-22 ENCOUNTER — Inpatient Hospital Stay (HOSPITAL_COMMUNITY): Payer: PRIVATE HEALTH INSURANCE

## 2013-12-22 ENCOUNTER — Encounter (HOSPITAL_COMMUNITY): Payer: Self-pay

## 2013-12-22 DIAGNOSIS — L03221 Cellulitis of neck: Secondary | ICD-10-CM | POA: Diagnosis not present

## 2013-12-22 LAB — GLUCOSE, CAPILLARY
Glucose-Capillary: 214 mg/dL — ABNORMAL HIGH (ref 70–99)
Glucose-Capillary: 248 mg/dL — ABNORMAL HIGH (ref 70–99)

## 2013-12-22 LAB — LIPASE, BLOOD: LIPASE: 23 U/L (ref 11–59)

## 2013-12-22 MED ORDER — TECHNETIUM TC 99M SULFUR COLLOID
2.0000 | Freq: Once | INTRAVENOUS | Status: AC | PRN
  Administered 2013-12-22: 2 via ORAL

## 2013-12-22 MED ORDER — SULFAMETHOXAZOLE-TRIMETHOPRIM 400-80 MG PO TABS
1.0000 | ORAL_TABLET | Freq: Two times a day (BID) | ORAL | Status: DC
Start: 2013-12-22 — End: 2015-12-27

## 2013-12-22 MED ORDER — CETYLPYRIDINIUM CHLORIDE 0.05 % MT LIQD: 7.0000 mL | Freq: Two times a day (BID) | OROMUCOSAL | Status: DC

## 2013-12-22 MED ORDER — NITROGLYCERIN 0.4 MG SL SUBL: 0.4000 mg | SUBLINGUAL_TABLET | SUBLINGUAL | Status: AC | PRN

## 2013-12-22 MED ORDER — DOXYCYCLINE HYCLATE 50 MG PO CAPS: 100.0000 mg | ORAL_CAPSULE | Freq: Two times a day (BID) | ORAL | Status: DC

## 2013-12-22 NOTE — Progress Notes (Signed)
2 Days Post-Op  Subjective: Decreased neck pain noted.  Objective: Vital signs in last 24 hours: Temp:  [97.1 F (36.2 C)-97.6 F (36.4 C)] 97.6 F (36.4 C) (12/18 2637) Pulse Rate:  [65-82] 82 (12/18 0614) Resp:  [15-18] 18 (12/18 0614) BP: (154-178)/(64-71) 178/71 mmHg (12/18 0614) SpO2:  [93 %-95 %] 94 % (12/18 0614) Weight:  [91.264 kg (201 lb 3.2 oz)] 91.264 kg (201 lb 3.2 oz) (12/18 8588) Last BM Date: 12/20/13  Intake/Output from previous day: 12/17 0701 - 12/18 0700 In: 2607.5 [P.O.:290; I.V.:1767.5; IV Piggyback:550] Out: -  Intake/Output this shift:    General appearance: alert, cooperative and no distress Neck: Serosanguineous drainage noted from wound. Decreased induration noted.  Lab Results:   Recent Labs  12/21/13 0630  WBC 12.5*  HGB 12.1*  HCT 37.3*  PLT 274   BMET  Recent Labs  12/21/13 0630  NA 139  K 4.0  CL 99  CO2 31  GLUCOSE 186*  BUN 11  CREATININE 1.44*  CALCIUM 9.6   PT/INR No results for input(s): LABPROT, INR in the last 72 hours.  Studies/Results: Dg Esophagus  12/21/2013   CLINICAL DATA:  Dysphagia, choking, odynophagia, nausea, weight loss ; history hypertension, diabetes  EXAM: ESOPHOGRAM/BARIUM SWALLOW  TECHNIQUE: Single contrast examination was performed using thin barium. Patient swallowed a 12.5 mm diameter barium tablet.  FLUOROSCOPY TIME:  2 min 42 seconds  COMPARISON:  None  FINDINGS: Normal esophageal distention.  No esophageal mass or stricture.  12.5 mm diameter barium tablet passed from oral cavity to stomach without obstruction; trans the plate the gastroesophageal junction was noted, passing beyond with several additional swallows of water and barium.  Air contrast imaging unable to be performed due to patient condition.  No gross areas of mucosal irregularity, stricture, or persistent intraluminal filling defects identified.  Diffuse age-related esophageal dysmotility.  Postsurgical changes of CABG noted.   Atherosclerotic calcification aorta.  IMPRESSION: Age-related esophageal dysmotility.  No gross evidence of esophageal mass or stricture.   Electronically Signed   By: Lavonia Dana M.D.   On: 12/21/2013 11:04    Anti-infectives: Anti-infectives    Start     Dose/Rate Route Frequency Ordered Stop   12/19/13 1600  fluconazole (DIFLUCAN) IVPB 100 mg  Status:  Discontinued     100 mg50 mL/hr over 60 Minutes Intravenous Every 24 hours 12/18/13 1645 12/19/13 1058   12/19/13 1100  fluconazole (DIFLUCAN) tablet 100 mg     100 mg Oral Daily 12/19/13 1058     12/19/13 1000  fluconazole (DIFLUCAN) tablet 100 mg  Status:  Discontinued     100 mg Oral Daily 12/18/13 1633 12/18/13 1644   12/19/13 0200  vancomycin (VANCOCIN) IVPB 1000 mg/200 mL premix     1,000 mg200 mL/hr over 60 Minutes Intravenous Every 12 hours 12/18/13 1600     12/19/13 0000  piperacillin-tazobactam (ZOSYN) IVPB 3.375 g     3.375 g12.5 mL/hr over 240 Minutes Intravenous Every 8 hours 12/18/13 1600     12/18/13 1700  fluconazole (DIFLUCAN) IVPB 200 mg     200 mg100 mL/hr over 60 Minutes Intravenous  Once 12/18/13 1645 12/18/13 1917   12/18/13 1645  fluconazole (DIFLUCAN) 40 MG/ML suspension 400 mg  Status:  Discontinued     400 mg Oral Daily 12/18/13 1630 12/18/13 1633   12/18/13 1645  fluconazole (DIFLUCAN) tablet 200 mg  Status:  Discontinued     200 mg Oral  Once 12/18/13 1633 12/18/13 1644  12/18/13 1645  fluconazole (DIFLUCAN) IVPB 200 mg  Status:  Discontinued     200 mg100 mL/hr over 60 Minutes Intravenous Every 24 hours 12/18/13 1644 12/18/13 1645   12/18/13 1500  piperacillin-tazobactam (ZOSYN) IVPB 3.375 g     3.375 g100 mL/hr over 30 Minutes Intravenous  Once 12/18/13 1450 12/18/13 1618   12/18/13 1430  vancomycin (VANCOCIN) IVPB 1000 mg/200 mL premix     1,000 mg200 mL/hr over 60 Minutes Intravenous  Once 12/18/13 1428 12/18/13 1542      Assessment/Plan: s/p Procedure(s): INCISION AND DRAINAGE ABSCESS  NECK Impression: Neck abscess, resolving. Cultures reveal staph aureus. No mention of MRSA at this time. Would treat with Bactrim or doxycycline as an outpatient. Would continue to express fluid from the wound. Would treat for a total of 10 days.  LOS: 4 days    Karthika Glasper A 12/22/2013

## 2013-12-22 NOTE — Progress Notes (Signed)
Patient continues with nausea unresponsive to Zofran GES reveals no evidence of gastroparesis currently on dual antibiotics for neck abscess inability to swallow pills completely oriented patient [name] BOF:[id number] DOB: 1942/12/13 DOA: 12/18/2013 PCP: Maricela Curet, MD             Physical Exam: Blood pressure 178/71, pulse 82, temperature 97.6 F (36.4 C), temperature source Oral, resp. rate 18, height 5\' 11"  (1.803 m), weight 201 lb 3.2 oz (91.264 kg), SpO2 94 %. no JVD no carotid bruits no thyromegaly lungs clear to A&P no rales wheeze or rhonchi heart regular rhythm no*for measles rubs abdomen soft nontender bowel sounds normoactive no guarding or rebound or masses no megaly   Investigations:  Recent Results (from the past 240 hour(s))  Blood culture (routine x 2)     Status: None (Preliminary result)   Collection Time: 12/18/13 10:01 AM  Result Value Ref Range Status   Specimen Description BLOOD RIGHT FOREARM  Final   Special Requests BOTTLES DRAWN AEROBIC AND ANAEROBIC 6CC  Final   Culture NO GROWTH 3 DAYS  Final   Report Status PENDING  Incomplete  Blood culture (routine x 2)     Status: None (Preliminary result)   Collection Time: 12/18/13 10:05 AM  Result Value Ref Range Status   Specimen Description BLOOD RIGHT WRIST DRAWN BY RN [facility]  Final   Special Requests BOTTLES DRAWN AEROBIC AND ANAEROBIC 6CC  Final   Culture NO GROWTH 3 DAYS  Final   Report Status PENDING  Incomplete  Surgical pcr screen     Status: None   Collection Time: 12/19/13  7:31 PM  Result Value Ref Range Status   MRSA, PCR NEGATIVE NEGATIVE Final   Staphylococcus aureus NEGATIVE NEGATIVE Final    Comment:        The Xpert SA Assay (FDA approved for NASAL specimens in patients over 50 years of age), is one component of a comprehensive surveillance program.  Test performance has been validated by [employer] for patients greater than or equal to 17 year old. It is not  intended to diagnose infection nor to guide or monitor treatment.   Anaerobic culture     Status: None (Preliminary result)   Collection Time: 12/20/13 10:44 AM  Result Value Ref Range Status   Specimen Description ABSCESS NECK  Final   Special Requests Antibiotic Treatment: Zosyn 3.375g  Final   Gram Stain   Final    FEW WBC PRESENT,BOTH PMN AND MONONUCLEAR NO SQUAMOUS EPITHELIAL CELLS SEEN RARE GRAM POSITIVE COCCI IN PAIRS IN CLUSTERS Performed at [facility]    Culture   Final    NO ANAEROBES ISOLATED; CULTURE IN PROGRESS FOR 5 DAYS Performed at [facility]    Report Status PENDING  Incomplete  Culture, routine-abscess     Status: None (Preliminary result)   Collection Time: 12/20/13 10:44 AM  Result Value Ref Range Status   Specimen Description ABSCESS NECK  Final   Special Requests Antibiotic Treatment: Zosyn 3.375g  Final   Gram Stain   Final    FEW WBC PRESENT,BOTH PMN AND MONONUCLEAR NO SQUAMOUS EPITHELIAL CELLS SEEN RARE GRAM POSITIVE COCCI IN PAIRS IN CLUSTERS Performed at [facility]    Culture   Final    MODERATE STAPHYLOCOCCUS AUREUS Note: RIFAMPIN AND GENTAMICIN SHOULD NOT BE USED AS SINGLE DRUGS FOR TREATMENT OF STAPH INFECTIONS. Performed at [facility]    Report Status PENDING  Incomplete  Basic Metabolic Panel:  Recent Labs  12/21/13 0630  NA 139  K 4.0  CL 99  CO2 31  GLUCOSE 186*  BUN 11  CREATININE 1.44*  CALCIUM 9.6   Liver Function Tests: No results for input(s): AST, ALT, ALKPHOS, BILITOT, PROT, ALBUMIN in the last 72 hours.   CBC:  Recent Labs  12/21/13 0630  WBC 12.5*  HGB 12.1*  HCT 37.3*  MCV 88.4  PLT 274    Nm Gastric Emptying  12/22/2013   CLINICAL DATA:  Vomiting, diabetes, hypertension, coronary artery disease, GERD, hypercholesterolemia  EXAM: NUCLEAR MEDICINE GASTRIC EMPTYING SCAN  TECHNIQUE: After oral ingestion of radiolabeled meal, sequential abdominal images were  obtained for 60 minutes. Residual percentage of activity remaining within the stomach was calculated at 60 minutes. Exam is typically performed for 120 min but was terminated prematurely due to near complete emptying of the stomach already identified at 60 min.  RADIOPHARMACEUTICALS:  2 mCi Technetium 99-m labeled sulfur colloid orally  COMPARISON:  None  FINDINGS: Rapid emptying of tracer from the stomach.  At 30 min, 94% emptying has occurred.  At 60 min, 95% emptying has occurred.  Findings could reflect dumping syndrome.  No evidence of abnormal delayed gastric emptying identified.  IMPRESSION: Rapid emptying of tracer from the stomach with 94% emptying by 30 min.  Findings could reflect dumping syndrome.  No evidence of gastroparesis.   Electronically Signed   By: Lavonia Dana M.D.   On: 12/22/2013 09:51   Dg Esophagus  12/21/2013   CLINICAL DATA:  Dysphagia, choking, odynophagia, nausea, weight loss ; history hypertension, diabetes  EXAM: ESOPHOGRAM/BARIUM SWALLOW  TECHNIQUE: Single contrast examination was performed using thin barium. Patient swallowed a 12.5 mm diameter barium tablet.  FLUOROSCOPY TIME:  2 min 42 seconds  COMPARISON:  None  FINDINGS: Normal esophageal distention.  No esophageal mass or stricture.  12.5 mm diameter barium tablet passed from oral cavity to stomach without obstruction; trans the plate the gastroesophageal junction was noted, passing beyond with several additional swallows of water and barium.  Air contrast imaging unable to be performed due to patient condition.  No gross areas of mucosal irregularity, stricture, or persistent intraluminal filling defects identified.  Diffuse age-related esophageal dysmotility.  Postsurgical changes of CABG noted.  Atherosclerotic calcification aorta.  IMPRESSION: Age-related esophageal dysmotility.  No gross evidence of esophageal mass or stricture.   Electronically Signed   By: Lavonia Dana M.D.   On: 12/21/2013 11:04      Medications:    Impression:  Principal Problem:   Cellulitis and abscess Active Problems:   Essential hypertension   Obesity (BMI 30-39.9): BMI 31.3   Nausea and vomiting   Thrush, oral   Chest pain at rest   Leukocytosis   Diabetes   Chronic back pain   Pulmonary vascular congestion   Dysphagia   Odynophagia   Abnormal weight loss     Plan: Continue IV antibiotics due to inability to swallow oral minutes  Consultants: Surgery and gastroenterology   Procedures GES   Antibiotics: Vancomycin and Zosyn IV                  Code Status:   Family Communication:    Disposition Plan continue dual antibiotics advance diet check neck abscess  Time spent: 30 minutes   LOS: 4 days   Aleiyah Halpin M   12/22/2013, 1:34 PM

## 2013-12-22 NOTE — Progress Notes (Signed)
Subjective:  Patient wants to go home. Still has heartburn and nausea. Zofran is not helping his nausea. Completed GES this morning without vomiting.   Objective: Vital signs in last 24 hours: Temp:  [97.1 F (36.2 C)-97.6 F (36.4 C)] 97.6 F (36.4 C) (12/18 2947) Pulse Rate:  [65-82] 82 (12/18 0614) Resp:  [15-18] 18 (12/18 0614) BP: (154-178)/(64-71) 178/71 mmHg (12/18 0614) SpO2:  [93 %-95 %] 94 % (12/18 0614) Weight:  [201 lb 3.2 oz (91.264 kg)] 201 lb 3.2 oz (91.264 kg) (12/18 6546) Last BM Date: 12/20/13 General:   Alert,  Well-developed, well-nourished, pleasant and cooperative in NAD Head:  Normocephalic and atraumatic. Eyes:  Sclera clear, no icterus.  Abdomen:  Soft, nontender and nondistended.   Normal bowel sounds, without guarding, and without rebound.   Extremities:  Without clubbing, deformity or edema. Neurologic:  Alert and  oriented x4;  grossly normal neurologically. Skin:  Intact without significant lesions or rashes. Psych:  Alert and cooperative. Normal mood and affect.  Intake/Output from previous day: 12/17 0701 - 12/18 0700 In: 2607.5 [P.O.:290; I.V.:1767.5; IV Piggyback:550] Out: -  Intake/Output this shift:    Lab Results: CBC  Recent Labs  12/21/13 0630  WBC 12.5*  HGB 12.1*  HCT 37.3*  MCV 88.4  PLT 274   BMET  Recent Labs  12/21/13 0630  NA 139  K 4.0  CL 99  CO2 31  GLUCOSE 186*  BUN 11  CREATININE 1.44*  CALCIUM 9.6   LFTs No results for input(s): BILITOT, BILIDIR, IBILI, ALKPHOS, AST, ALT, PROT, ALBUMIN in the last 72 hours.  Recent Labs  12/20/13 0635 12/21/13 0630 12/22/13 0621  LIPASE 14 16 23    PT/INR No results for input(s): LABPROT, INR in the last 72 hours.    Imaging Studies: Ct Soft Tissue Neck W Contrast  12/18/2013   CLINICAL DATA:  Swelling/boil right side of posterior neck marked by BB beginning 2 weeks ago. Diabetic.  EXAM: CT NECK WITH CONTRAST  TECHNIQUE: Multidetector CT imaging of the neck was  performed using the standard protocol following the bolus administration of intravenous contrast.  CONTRAST:  75 mL Omnipaque 300 IV  COMPARISON:  None.  FINDINGS: The visualized orbits are normal and symmetric. Paranasal sinuses and mastoid air cells are clear. Spaces of the suprahyoid neck are within normal. There is mild calcified plaque at the carotid bifurcations and proximal internal carotid arteries bilaterally. Airway and epiglottis are within normal.  In the area of clinical concern over the posterior right neck subcutaneous fat at the level of C1-C3 is a focal inflammatory/infectious process. There is moderate stranding of the subcutaneous fat with a somewhat poorly defined oval likely evolving fluid collection measuring approximately 1.5 x 2.8 x 3.7 cm in AP, transverse and craniocaudal dimensions. This fluid collection abuts the deep layer of the skin, but is centered within the subcutaneous fat this inflammatory/infectious process does not involve the deeper paraspinal muscle compartment. No significant adenopathy.  Infrahyoid neck demonstrates several small thyroid nodules measuring up to 1.1 cm in size.  There is calcified plaque over the thoracic aorta. Visualized lung apices are within normal. There are mild degenerate changes of the spine with disc disease at the C5-6 level.  IMPRESSION: Evolving focal fluid collection within the subcutaneous fat over the right posterior neck corresponding to patient's clinical palpable abnormality and measuring approximately 1.5 x 2.8 x 3.7 cm in AP, transverse and craniocaudal dimensions. Stranding of the adjacent fat. This likely represents a focal  inflammatory/infectious process. This process abuts the deep third of the scan and is not involve the deeper muscle compartment.  Multiple thyroid nodules measuring up to 1.1 cm in size likely multinodular goiter. Recommend followup as clinically indicated.  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  Atherosclerotic disease of the carotid bifurcations and proximal internal carotid arteries bilaterally.   Electronically Signed   By: Marin Olp M.D.   On: 12/18/2013 14:02   Dg Esophagus  12/21/2013   CLINICAL DATA:  Dysphagia, choking, odynophagia, nausea, weight loss ; history hypertension, diabetes  EXAM: ESOPHOGRAM/BARIUM SWALLOW  TECHNIQUE: Single contrast examination was performed using thin barium. Patient swallowed a 12.5 mm diameter barium tablet.  FLUOROSCOPY TIME:  2 min 42 seconds  COMPARISON:  None  FINDINGS: Normal esophageal distention.  No esophageal mass or stricture.  12.5 mm diameter barium tablet passed from oral cavity to stomach without obstruction; trans the plate the gastroesophageal junction was noted, passing beyond with several additional swallows of water and barium.  Air contrast imaging unable to be performed due to patient condition.  No gross areas of mucosal irregularity, stricture, or persistent intraluminal filling defects identified.  Diffuse age-related esophageal dysmotility.  Postsurgical changes of CABG noted.  Atherosclerotic calcification aorta.  IMPRESSION: Age-related esophageal dysmotility.  No gross evidence of esophageal mass or stricture.   Electronically Signed   By: Lavonia Dana M.D.   On: 12/21/2013 11:04   Dg Chest Portable 1 View  12/18/2013   CLINICAL DATA:  Nausea, chest discomfort, weakness and dizziness beginning 1 week ago, history diabetes, hypertension, coronary artery disease post CABG  EXAM: PORTABLE CHEST - 1 VIEW  COMPARISON:  Portable exam 1011 hr compared to 12/09/2012  FINDINGS: Enlargement of cardiac silhouette post CABG.  Pulmonary vascular congestion.  Mild bibasilar atelectasis.  LEFT perihilar infiltrate question infection versus asymmetric edema.  Remaining lungs clear.  No pleural effusion or pneumothorax.  IMPRESSION: Enlargement of cardiac silhouette post CABG.  Pulmonary vascular congestion with bibasilar atelectasis and  LEFT perihilar infiltrate which could represent asymmetric edema or pneumonia.   Electronically Signed   By: Lavonia Dana M.D.   On: 12/18/2013 10:23  [2 weeks]   Assessment: 80 year old male with persistent dysphagia, vague odynophagia, persistent nausea, and documented weight loss since at least July this year. 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 CT abdomen in Aug 2015 without occult malignancy. Per history, notes white exudate after taking antibiotics recently, likely thrush but no evidence of this on exam today. Started on Diflucan per admitting service. UGI on file from July 2015 unremarkable.BPE showed dysmotility but no obstruction. GES planned for today.   As of note, likely multinodular goiter on CT of neck: follow-up with primary physician regarding further evaluation. TSH low. Management per attending.  Plan: 1. F/U GES results as available. 2. Discussed his esophageal dysmotility with regards to sitting upright for 30 minutes after meals, eating slowly, using plenty of liquid during meals.    LOS: 4 days   Neil Crouch  12/22/2013, 8:37 AM

## 2013-12-22 NOTE — Discharge Summary (Signed)
Physician Discharge Summary  [name] SAY:[id number] DOB: October 09, 1931 DOA: 12/18/2013  PCP: Maricela Curet, MD  Admit date: 12/18/2013 Discharge date: 12/22/2013   Recommendations for Outpatient Follow-up:  Patient to follow-up with myself in 3 days time to assess neck abscess and abdominal complaints Discharge Diagnoses:  Principal Problem:   Cellulitis and abscess Active Problems:   Essential hypertension   Obesity (BMI 30-39.9): BMI 31.3   Nausea and vomiting   Thrush, oral   Chest pain at rest   Leukocytosis   Diabetes   Chronic back pain   Pulmonary vascular congestion   Dysphagia   Odynophagia   Abnormal weight loss   Discharge Condition: Good  Filed Weights   12/20/13 0434 12/21/13 0550 12/22/13 0614  Weight: 190 lb (86.183 kg) 189 lb 9.5 oz (86 kg) 201 lb 3.2 oz (91.264 kg)    History of present illness:  Patient was admitted with neck abscess could not swallow pills and identifies she had dysplasia intractable nausea nonresponsiveness (was admitted placed on IV Vanco and Zosyn for neck and surgical consultation with an IND performed in consultation by gastroenterology who increased her Protonix to twice a day gastric imaging study revealed no evidence of gastroparesis recent abdominal CT and revealed no evidence of pathology and patient had swallowing study revealing mild esophageal dysmotility. Presbyesophagus no stricture noted  Hospital Course:  Patient had GI symptoms stabilized advanced under soft I was able tolerate sent home on oral antibiotics and twice a day PPIs  Procedures:  IND of neck abscess  Consultations:  Surgery and gastroenterology  Discharge Instructions  Discharge Instructions    Discharge instructions    Complete by:  As directed      Discharge patient    Complete by:  As directed             Medication List    STOP taking these medications        pantoprazole 40 MG tablet  Commonly known as:  PROTONIX      TAKE these medications        antiseptic oral rinse 0.05 % Liqd solution  Commonly known as:  CPC / CETYLPYRIDINIUM CHLORIDE 0.05%  7 mLs by Mouth Rinse route 2 (two) times daily.     aspirin 325 MG EC tablet  Take 325 mg by mouth daily.     AZOR 5-40 MG per tablet  Generic drug:  amLODipine-olmesartan  Take 1 tablet by mouth daily.     doxycycline 50 MG capsule  Commonly known as:  VIBRAMYCIN  Take 2 capsules (100 mg total) by mouth 2 (two) times daily.     LANTUS SOLOSTAR 100 UNIT/ML Solostar Pen  Generic drug:  Insulin Glargine  Inject 40 Units into the skin at bedtime.     nitroGLYCERIN 0.4 MG SL tablet  Commonly known as:  NITROSTAT  Place 1 tablet (0.4 mg total) under the tongue every 5 (five) minutes as needed for chest pain.     NOVOLOG FLEXPEN 100 UNIT/ML FlexPen  Generic drug:  insulin aspart  Inject 20 Units into the skin 2 (two) times daily with a meal.     oxyCODONE 15 MG immediate release tablet  Commonly known as:  ROXICODONE  Take 15-30 mg by mouth every 4 (four) hours as needed for pain.     rosuvastatin 10 MG tablet  Commonly known as:  CRESTOR  Take 10 mg by mouth daily.     silodosin 8 MG Caps capsule  Commonly known as:  RAPAFLO  Take 8 mg by mouth daily with breakfast.     sulfamethoxazole-trimethoprim 400-80 MG per tablet  Commonly known as:  BACTRIM  Take 1 tablet by mouth 2 (two) times daily.       No Known Allergies     Follow-up Information    Follow up with Jamesetta So, MD. Call on 12/26/2013.   Specialty:  General Surgery   Why:  For wound re-check   Contact information:   [address] [place] [address] [phone]        The results of significant diagnostics from this hospitalization (including imaging, microbiology, ancillary and laboratory) are listed below for reference.    Significant Diagnostic Studies: Ct Soft Tissue Neck W Contrast  12/18/2013   CLINICAL DATA:  Swelling/boil right side of  posterior neck marked by BB beginning 2 weeks ago. Diabetic.  EXAM: CT NECK WITH CONTRAST  TECHNIQUE: Multidetector CT imaging of the neck was performed using the standard protocol following the bolus administration of intravenous contrast.  CONTRAST:  75 mL Omnipaque 300 IV  COMPARISON:  None.  FINDINGS: The visualized orbits are normal and symmetric. Paranasal sinuses and mastoid air cells are clear. Spaces of the suprahyoid neck are within normal. There is mild calcified plaque at the carotid bifurcations and proximal internal carotid arteries bilaterally. Airway and epiglottis are within normal.  In the area of clinical concern over the posterior right neck subcutaneous fat at the level of C1-C3 is a focal inflammatory/infectious process. There is moderate stranding of the subcutaneous fat with a somewhat poorly defined oval likely evolving fluid collection measuring approximately 1.5 x 2.8 x 3.7 cm in AP, transverse and craniocaudal dimensions. This fluid collection abuts the deep layer of the skin, but is centered within the subcutaneous fat this inflammatory/infectious process does not involve the deeper paraspinal muscle compartment. No significant adenopathy.  Infrahyoid neck demonstrates several small thyroid nodules measuring up to 1.1 cm in size.  There is calcified plaque over the thoracic aorta. Visualized lung apices are within normal. There are mild degenerate changes of the spine with disc disease at the C5-6 level.  IMPRESSION: Evolving focal fluid collection within the subcutaneous fat over the right posterior neck corresponding to patient's clinical palpable abnormality and measuring approximately 1.5 x 2.8 x 3.7 cm in AP, transverse and craniocaudal dimensions. Stranding of the adjacent fat. This likely represents a focal inflammatory/infectious process. This process abuts the deep third of the scan and is not involve the deeper muscle compartment.  Multiple thyroid nodules measuring up to 1.1  cm in size likely multinodular goiter. Recommend followup as clinically indicated.  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  Atherosclerotic disease of the carotid bifurcations and proximal internal carotid arteries bilaterally.   Electronically Signed   By: Marin Olp M.D.   On: 12/18/2013 14:02   Nm Gastric Emptying  12/22/2013   CLINICAL DATA:  Vomiting, diabetes, hypertension, coronary artery disease, GERD, hypercholesterolemia  EXAM: NUCLEAR MEDICINE GASTRIC EMPTYING SCAN  TECHNIQUE: After oral ingestion of radiolabeled meal, sequential abdominal images were obtained for 60 minutes. Residual percentage of activity remaining within the stomach was calculated at 60 minutes. Exam is typically performed for 120 min but was terminated prematurely due to near complete emptying of the stomach already identified at 60 min.  RADIOPHARMACEUTICALS:  2 mCi Technetium 99-m labeled sulfur colloid orally  COMPARISON:  None  FINDINGS: Rapid emptying of tracer from the stomach.  At 30 min, 94% emptying has occurred.  At 60 min, 95% emptying has occurred.  Findings could reflect dumping syndrome.  No evidence of abnormal delayed gastric emptying identified.  IMPRESSION: Rapid emptying of tracer from the stomach with 94% emptying by 30 min.  Findings could reflect dumping syndrome.  No evidence of gastroparesis.   Electronically Signed   By: Lavonia Dana M.D.   On: 12/22/2013 09:51   Dg Esophagus  12/21/2013   CLINICAL DATA:  Dysphagia, choking, odynophagia, nausea, weight loss ; history hypertension, diabetes  EXAM: ESOPHOGRAM/BARIUM SWALLOW  TECHNIQUE: Single contrast examination was performed using thin barium. Patient swallowed a 12.5 mm diameter barium tablet.  FLUOROSCOPY TIME:  2 min 42 seconds  COMPARISON:  None  FINDINGS: Normal esophageal distention.  No esophageal mass or stricture.  12.5 mm diameter barium tablet passed from oral cavity to stomach without obstruction; trans the plate  the gastroesophageal junction was noted, passing beyond with several additional swallows of water and barium.  Air contrast imaging unable to be performed due to patient condition.  No gross areas of mucosal irregularity, stricture, or persistent intraluminal filling defects identified.  Diffuse age-related esophageal dysmotility.  Postsurgical changes of CABG noted.  Atherosclerotic calcification aorta.  IMPRESSION: Age-related esophageal dysmotility.  No gross evidence of esophageal mass or stricture.   Electronically Signed   By: Lavonia Dana M.D.   On: 12/21/2013 11:04   Dg Chest Portable 1 View  12/18/2013   CLINICAL DATA:  Nausea, chest discomfort, weakness and dizziness beginning 1 week ago, history diabetes, hypertension, coronary artery disease post CABG  EXAM: PORTABLE CHEST - 1 VIEW  COMPARISON:  Portable exam 1011 hr compared to 12/09/2012  FINDINGS: Enlargement of cardiac silhouette post CABG.  Pulmonary vascular congestion.  Mild bibasilar atelectasis.  LEFT perihilar infiltrate question infection versus asymmetric edema.  Remaining lungs clear.  No pleural effusion or pneumothorax.  IMPRESSION: Enlargement of cardiac silhouette post CABG.  Pulmonary vascular congestion with bibasilar atelectasis and LEFT perihilar infiltrate which could represent asymmetric edema or pneumonia.   Electronically Signed   By: Lavonia Dana M.D.   On: 12/18/2013 10:23    Microbiology: Recent Results (from the past 240 hour(s))  Blood culture (routine x 2)     Status: None (Preliminary result)   Collection Time: 12/18/13 10:01 AM  Result Value Ref Range Status   Specimen Description BLOOD RIGHT FOREARM  Final   Special Requests BOTTLES DRAWN AEROBIC AND ANAEROBIC 6CC  Final   Culture NO GROWTH 3 DAYS  Final   Report Status PENDING  Incomplete  Blood culture (routine x 2)     Status: None (Preliminary result)   Collection Time: 12/18/13 10:05 AM  Result Value Ref Range Status   Specimen Description BLOOD  RIGHT WRIST DRAWN BY RN [facility]  Final   Special Requests BOTTLES DRAWN AEROBIC AND ANAEROBIC 6CC  Final   Culture NO GROWTH 3 DAYS  Final   Report Status PENDING  Incomplete  Surgical pcr screen     Status: None   Collection Time: 12/19/13  7:31 PM  Result Value Ref Range Status   MRSA, PCR NEGATIVE NEGATIVE Final   Staphylococcus aureus NEGATIVE NEGATIVE Final    Comment:        The Xpert SA Assay (FDA approved for NASAL specimens in patients over 32 years of age), is one component of a comprehensive surveillance program.  Test performance has been validated by [employer] for patients greater than or equal  to 52 year old. It is not intended to diagnose infection nor to guide or monitor treatment.   Anaerobic culture     Status: None (Preliminary result)   Collection Time: 12/20/13 10:44 AM  Result Value Ref Range Status   Specimen Description ABSCESS NECK  Final   Special Requests Antibiotic Treatment: Zosyn 3.375g  Final   Gram Stain   Final    FEW WBC PRESENT,BOTH PMN AND MONONUCLEAR NO SQUAMOUS EPITHELIAL CELLS SEEN RARE GRAM POSITIVE COCCI IN PAIRS IN CLUSTERS Performed at [facility]    Culture   Final    NO ANAEROBES ISOLATED; CULTURE IN PROGRESS FOR 5 DAYS Performed at [facility]    Report Status PENDING  Incomplete  Culture, routine-abscess     Status: None (Preliminary result)   Collection Time: 12/20/13 10:44 AM  Result Value Ref Range Status   Specimen Description ABSCESS NECK  Final   Special Requests Antibiotic Treatment: Zosyn 3.375g  Final   Gram Stain   Final    FEW WBC PRESENT,BOTH PMN AND MONONUCLEAR NO SQUAMOUS EPITHELIAL CELLS SEEN RARE GRAM POSITIVE COCCI IN PAIRS IN CLUSTERS Performed at [facility]    Culture   Final    MODERATE STAPHYLOCOCCUS AUREUS Note: RIFAMPIN AND GENTAMICIN SHOULD NOT BE USED AS SINGLE DRUGS FOR TREATMENT OF STAPH INFECTIONS. Performed at [facility]    Report Status PENDING   Incomplete     Labs: Basic Metabolic Panel:  Recent Labs Lab 12/18/13 1008 12/19/13 0636 12/21/13 0630  NA 139 139 139  K 3.9 4.4 4.0  CL 100 98 99  CO2 29 30 31   GLUCOSE 96 279* 186*  BUN 13 14 11   CREATININE 0.94 1.13 1.44*  CALCIUM 10.3 9.8 9.6   Liver Function Tests:  Recent Labs Lab 12/18/13 1008  AST 10  ALT 10  ALKPHOS 99  BILITOT 0.4  PROT 7.1  ALBUMIN 3.1*    Recent Labs Lab 12/20/13 0635 12/21/13 0630 12/22/13 0621  LIPASE 14 16 23    No results for input(s): AMMONIA in the last 168 hours. CBC:  Recent Labs Lab 12/18/13 1008 12/19/13 0636 12/21/13 0630  WBC 14.5* 14.2* 12.5*  NEUTROABS 12.0*  --   --   HGB 13.7 12.4* 12.1*  HCT 40.9 37.6* 37.3*  MCV 85.2 86.6 88.4  PLT 264 258 274   Cardiac Enzymes:  Recent Labs Lab 12/18/13 1008 12/18/13 1753 12/19/13 0636  TROPONINI <0.30 <0.30 <0.30   BNP: BNP (last 3 results)  Recent Labs  12/18/13 1008  PROBNP 239.9   CBG:  Recent Labs Lab 12/21/13 1117 12/21/13 1640 12/21/13 2134 12/22/13 0826 12/22/13 1149  GLUCAP 137* 161* 152* 214* 248*       Signed:  Monay Houlton M  [facility] Pager: [phone] 12/22/2013, 1:57 PM

## 2013-12-22 NOTE — Progress Notes (Signed)
Patient discharged with instructions, prescription, and care notes.  Verbalized understanding via teach back.  IV was removed and the site was WNL. Patient voiced no further complaints or concerns at the time of discharge.  Appointments scheduled per instructions.  Patient left the floor via w/c with staff and family in stable condition. 

## 2013-12-23 LAB — CULTURE, BLOOD (ROUTINE X 2)
CULTURE: NO GROWTH
Culture: NO GROWTH

## 2013-12-23 LAB — CULTURE, ROUTINE-ABSCESS

## 2013-12-25 LAB — ANAEROBIC CULTURE

## 2013-12-25 NOTE — Progress Notes (Signed)
UR chart review completed.  

## 2014-06-11 ENCOUNTER — Other Ambulatory Visit (HOSPITAL_COMMUNITY): Payer: Self-pay | Admitting: Family Medicine

## 2014-06-11 ENCOUNTER — Ambulatory Visit (HOSPITAL_COMMUNITY)
Admission: RE | Admit: 2014-06-11 | Discharge: 2014-06-11 | Disposition: A | Discharge status: Home / Self Care | Payer: Medicare Other | Source: Ambulatory Visit | Attending: Family Medicine | Admitting: Family Medicine

## 2014-06-11 DIAGNOSIS — R062 Wheezing: Secondary | ICD-10-CM | POA: Insufficient documentation

## 2014-06-11 DIAGNOSIS — J40 Bronchitis, not specified as acute or chronic: Secondary | ICD-10-CM

## 2014-06-11 DIAGNOSIS — R05 Cough: Secondary | ICD-10-CM | POA: Diagnosis present

## 2014-07-11 ENCOUNTER — Encounter: Payer: Self-pay | Admitting: Internal Medicine

## 2014-09-13 ENCOUNTER — Telehealth: Payer: Self-pay

## 2014-09-13 NOTE — Telephone Encounter (Signed)
-----   Message from Barron Alvine, [place] sent at 09/12/2013 10:01 AM EDT ----- recall CT scan abd (with oral contrast but no IV contrast)

## 2014-09-13 NOTE — Telephone Encounter (Signed)
Left message on machine to call back  

## 2014-09-18 NOTE — Telephone Encounter (Signed)
Left message on machine to call back  

## 2014-09-20 NOTE — Telephone Encounter (Signed)
Unable to reach pt letter mailed 

## 2015-01-10 ENCOUNTER — Other Ambulatory Visit (HOSPITAL_COMMUNITY): Payer: Self-pay | Admitting: Family Medicine

## 2015-01-10 ENCOUNTER — Ambulatory Visit (HOSPITAL_COMMUNITY)
Admission: RE | Admit: 2015-01-10 | Discharge: 2015-01-10 | Disposition: A | Discharge status: Home / Self Care | Payer: Medicare Other | Source: Ambulatory Visit | Attending: Family Medicine | Admitting: Family Medicine

## 2015-01-10 DIAGNOSIS — R0781 Pleurodynia: Secondary | ICD-10-CM | POA: Insufficient documentation

## 2015-01-10 DIAGNOSIS — R0602 Shortness of breath: Secondary | ICD-10-CM | POA: Insufficient documentation

## 2015-01-11 ENCOUNTER — Other Ambulatory Visit (HOSPITAL_COMMUNITY): Payer: Self-pay | Admitting: Family Medicine

## 2015-01-11 ENCOUNTER — Ambulatory Visit (HOSPITAL_COMMUNITY)
Admission: RE | Admit: 2015-01-11 | Discharge: 2015-01-11 | Disposition: A | Discharge status: Home / Self Care | Payer: Medicare Other | Source: Ambulatory Visit | Attending: Family Medicine | Admitting: Family Medicine

## 2015-01-11 DIAGNOSIS — R0602 Shortness of breath: Secondary | ICD-10-CM

## 2015-01-11 DIAGNOSIS — R079 Chest pain, unspecified: Secondary | ICD-10-CM

## 2015-01-11 DIAGNOSIS — Z951 Presence of aortocoronary bypass graft: Secondary | ICD-10-CM | POA: Insufficient documentation

## 2015-01-11 DIAGNOSIS — E119 Type 2 diabetes mellitus without complications: Secondary | ICD-10-CM | POA: Diagnosis not present

## 2015-01-11 DIAGNOSIS — R791 Abnormal coagulation profile: Secondary | ICD-10-CM | POA: Insufficient documentation

## 2015-01-11 DIAGNOSIS — J90 Pleural effusion, not elsewhere classified: Secondary | ICD-10-CM | POA: Insufficient documentation

## 2015-01-11 MED ORDER — IOHEXOL 350 MG/ML SOLN
100.0000 mL | Freq: Once | INTRAVENOUS | Status: AC | PRN
  Administered 2015-01-11: 100 mL via INTRAVENOUS

## 2015-06-06 ENCOUNTER — Other Ambulatory Visit (HOSPITAL_COMMUNITY): Payer: Self-pay | Admitting: *Deleted

## 2015-06-06 ENCOUNTER — Ambulatory Visit (HOSPITAL_COMMUNITY)
Admission: RE | Admit: 2015-06-06 | Discharge: 2015-06-06 | Disposition: A | Discharge status: Home / Self Care | Payer: Medicare Other | Source: Ambulatory Visit | Attending: Family Medicine | Admitting: Family Medicine

## 2015-06-06 ENCOUNTER — Ambulatory Visit (HOSPITAL_COMMUNITY): Payer: Medicare Other

## 2015-06-06 ENCOUNTER — Other Ambulatory Visit (HOSPITAL_COMMUNITY): Payer: Self-pay | Admitting: Family Medicine

## 2015-06-06 DIAGNOSIS — R52 Pain, unspecified: Secondary | ICD-10-CM

## 2015-06-06 DIAGNOSIS — G319 Degenerative disease of nervous system, unspecified: Secondary | ICD-10-CM | POA: Diagnosis not present

## 2015-06-06 DIAGNOSIS — H578 Other specified disorders of eye and adnexa: Secondary | ICD-10-CM | POA: Diagnosis not present

## 2015-07-05 NOTE — Addendum Note (Signed)
Encounter addended by: Gerre Couch, RN on: 07/05/2015  1:54 PM<BR>     Documentation filed: Arn Medal VN

## 2015-07-22 ENCOUNTER — Other Ambulatory Visit (HOSPITAL_COMMUNITY): Payer: Self-pay | Admitting: Family Medicine

## 2015-07-22 ENCOUNTER — Ambulatory Visit (HOSPITAL_COMMUNITY)
Admission: RE | Admit: 2015-07-22 | Discharge: 2015-07-22 | Disposition: A | Discharge status: Home / Self Care | Payer: Medicare Other | Source: Ambulatory Visit | Attending: Family Medicine | Admitting: Family Medicine

## 2015-07-22 DIAGNOSIS — H5712 Ocular pain, left eye: Secondary | ICD-10-CM | POA: Diagnosis not present

## 2015-07-22 DIAGNOSIS — T148XXA Other injury of unspecified body region, initial encounter: Secondary | ICD-10-CM

## 2016-01-01 NOTE — Patient Instructions (Signed)
Your procedure is scheduled on: 01/10/2015  Report to [facility] at  46  AM.  Call this number if you have problems the morning of surgery: [phone]   Do not eat food or drink liquids :After Midnight.      Take these medicines the morning of surgery with A SIP OF WATER: azor, protonix, lyrica. Take 1/2 of your usual insulin dosage the night before your surgery. DO NOT take any medicine for diabetes the morning of your surgery.   Do not wear jewelry, make-up or nail polish.  Do not wear lotions, powders, or perfumes. You may wear deodorant.  Do not shave 48 hours prior to surgery.  Do not bring valuables to the hospital.  Contacts, dentures or bridgework may not be worn into surgery.  Leave suitcase in the car. After surgery it may be brought to your room.  For patients admitted to the hospital, checkout time is 11:00 AM the day of discharge.   Patients discharged the day of surgery will not be allowed to drive home.  :     Please read over the following fact sheets that you were given: Coughing and Deep Breathing, Surgical Site Infection Prevention, Anesthesia Post-op Instructions and Care and Recovery After Surgery    Cataract A cataract is a clouding of the lens of the eye. When a lens becomes cloudy, vision is reduced based on the degree and nature of the clouding. Many cataracts reduce vision to some degree. Some cataracts make people more near-sighted as they develop. Other cataracts increase glare. Cataracts that are ignored and become worse can sometimes look white. The white color can be seen through the pupil. CAUSES   Aging. However, cataracts may occur at any age, even in newborns.   Certain drugs.   Trauma to the eye.   Certain diseases such as diabetes.   Specific eye diseases such as chronic inflammation inside the eye or a sudden attack of a rare form of glaucoma.   Inherited or acquired medical problems.  SYMPTOMS   Gradual, progressive drop in vision in the  affected eye.   Severe, rapid visual loss. This most often happens when trauma is the cause.  DIAGNOSIS  To detect a cataract, an eye doctor examines the lens. Cataracts are best diagnosed with an exam of the eyes with the pupils enlarged (dilated) by drops.  TREATMENT  For an early cataract, vision may improve by using different eyeglasses or stronger lighting. If that does not help your vision, surgery is the only effective treatment. A cataract needs to be surgically removed when vision loss interferes with your everyday activities, such as driving, reading, or watching TV. A cataract may also have to be removed if it prevents examination or treatment of another eye problem. Surgery removes the cloudy lens and usually replaces it with a substitute lens (intraocular lens, IOL).  At a time when both you and your doctor agree, the cataract will be surgically removed. If you have cataracts in both eyes, only one is usually removed at a time. This allows the operated eye to heal and be out of danger from any possible problems after surgery (such as infection or poor wound healing). In rare cases, a cataract may be doing damage to your eye. In these cases, your caregiver may advise surgical removal right away. The vast majority of people who have cataract surgery have better vision afterward. HOME CARE INSTRUCTIONS  If you are not planning surgery, you may be asked to  do the following:  Use different eyeglasses.   Use stronger or brighter lighting.   Ask your eye doctor about reducing your medicine dose or changing medicines if it is thought that a medicine caused your cataract. Changing medicines does not make the cataract go away on its own.   Become familiar with your surroundings. Poor vision can lead to injury. Avoid bumping into things on the affected side. You are at a higher risk for tripping or falling.   Exercise extreme care when driving or operating machinery.   Wear sunglasses if you  are sensitive to bright light or experiencing problems with glare.  SEEK IMMEDIATE MEDICAL CARE IF:   You have a worsening or sudden vision loss.   You notice redness, swelling, or increasing pain in the eye.   You have a fever.  Document Released: 12/22/2004 Document Revised: 12/11/2010 Document Reviewed: 08/15/2010 [employer] Patient Information 2012 [place].PATIENT INSTRUCTIONS POST-ANESTHESIA  IMMEDIATELY FOLLOWING SURGERY:  Do not drive or operate machinery for the first twenty four hours after surgery.  Do not make any important decisions for twenty four hours after surgery or while taking narcotic pain medications or sedatives.  If you develop intractable nausea and vomiting or a severe headache please notify your doctor immediately.  FOLLOW-UP:  Please make an appointment with your surgeon as instructed. You do not need to follow up with anesthesia unless specifically instructed to do so.  WOUND CARE INSTRUCTIONS (if applicable):  Keep a dry clean dressing on the anesthesia/puncture wound site if there is drainage.  Once the wound has quit draining you may leave it open to air.  Generally you should leave the bandage intact for twenty four hours unless there is drainage.  If the epidural site drains for more than 36-48 hours please call the anesthesia department.  QUESTIONS?:  Please feel free to call your physician or the hospital operator if you have any questions, and they will be happy to assist you.

## 2016-01-03 ENCOUNTER — Encounter (HOSPITAL_COMMUNITY)
Admission: RE | Admit: 2016-01-03 | Discharge: 2016-01-03 | Disposition: A | Discharge status: Home / Self Care | Payer: Medicare Other | Source: Ambulatory Visit | Attending: Ophthalmology | Admitting: Ophthalmology

## 2016-01-03 ENCOUNTER — Encounter (HOSPITAL_COMMUNITY): Payer: Self-pay

## 2016-01-03 DIAGNOSIS — Z01818 Encounter for other preprocedural examination: Secondary | ICD-10-CM | POA: Insufficient documentation

## 2016-01-03 HISTORY — DX: Unspecified hearing loss, unspecified ear: H91.90

## 2016-01-03 HISTORY — DX: Personal history of urinary calculi: Z87.442

## 2016-01-03 LAB — CBC WITH DIFFERENTIAL/PLATELET
BASOS ABS: 0.1 10*3/uL (ref 0.0–0.1)
BASOS PCT: 1 %
EOS ABS: 0.2 10*3/uL (ref 0.0–0.7)
EOS PCT: 2 %
HCT: 46.9 % (ref 39.0–52.0)
Hemoglobin: 15.4 g/dL (ref 13.0–17.0)
Lymphocytes Relative: 9 %
Lymphs Abs: 0.9 10*3/uL (ref 0.7–4.0)
MCH: 29.1 pg (ref 26.0–34.0)
MCHC: 32.8 g/dL (ref 30.0–36.0)
MCV: 88.7 fL (ref 78.0–100.0)
MONO ABS: 0.9 10*3/uL (ref 0.1–1.0)
Monocytes Relative: 9 %
NEUTROS ABS: 7.7 10*3/uL (ref 1.7–7.7)
Neutrophils Relative %: 79 %
PLATELETS: 173 10*3/uL (ref 150–400)
RBC: 5.29 MIL/uL (ref 4.22–5.81)
RDW: 13.8 % (ref 11.5–15.5)
WBC: 9.7 10*3/uL (ref 4.0–10.5)

## 2016-01-03 LAB — BASIC METABOLIC PANEL
ANION GAP: 10 (ref 5–15)
BUN: 33 mg/dL — ABNORMAL HIGH (ref 6–20)
CALCIUM: 9.4 mg/dL (ref 8.9–10.3)
CO2: 26 mmol/L (ref 22–32)
Chloride: 104 mmol/L (ref 101–111)
Creatinine, Ser: 1.53 mg/dL — ABNORMAL HIGH (ref 0.61–1.24)
GFR, EST AFRICAN AMERICAN: 47 mL/min — AB (ref >60)
GFR, EST NON AFRICAN AMERICAN: 41 mL/min — AB (ref >60)
Glucose, Bld: 176 mg/dL — ABNORMAL HIGH (ref 65–99)
Potassium: 4 mmol/L (ref 3.5–5.1)
SODIUM: 140 mmol/L (ref 135–145)

## 2016-01-03 NOTE — Progress Notes (Signed)
   01/03/16 0938  OBSTRUCTIVE SLEEP APNEA  Have you ever been diagnosed with sleep apnea through a sleep study? No  Do you snore loudly (loud enough to be heard through closed doors)?  0  Do you often feel tired, fatigued, or sleepy during the daytime (such as falling asleep during driving or talking to someone)? 0  Has anyone observed you stop breathing during your sleep? 0  Do you have, or are you being treated for high blood pressure? 1  BMI more than 35 kg/m2? 1  Age > 50 (1-yes) 1  Neck circumference greater than:Male 16 inches or larger, Male 17inches or larger? 1  Male Gender (Yes=1) 1  Obstructive Sleep Apnea Score 5  Score 5 or greater  Results sent to PCP

## 2016-01-09 NOTE — OR Nursing (Signed)
EKG revealed A fib,  Last ekg was NSR,  Reported to Dr. Patsey Berthold ,  Orders given.  Also Dr. Lorriane Shire office called left message, as office was closed.   And faxed EKG to office. [phone]

## 2016-01-21 ENCOUNTER — Encounter (HOSPITAL_COMMUNITY)
Admission: RE | Admit: 2016-01-21 | Discharge: 2016-01-21 | Disposition: A | Discharge status: Home / Self Care | Payer: Medicare Other | Source: Ambulatory Visit | Attending: Ophthalmology | Admitting: Ophthalmology

## 2016-01-21 ENCOUNTER — Encounter (HOSPITAL_COMMUNITY): Payer: Self-pay

## 2016-02-03 ENCOUNTER — Encounter (HOSPITAL_COMMUNITY)
Admission: RE | Admit: 2016-02-03 | Discharge: 2016-02-03 | Disposition: A | Discharge status: Home / Self Care | Payer: Medicare Other | Source: Ambulatory Visit | Attending: Ophthalmology | Admitting: Ophthalmology

## 2016-02-03 ENCOUNTER — Encounter (HOSPITAL_COMMUNITY): Payer: Self-pay

## 2016-02-04 ENCOUNTER — Other Ambulatory Visit (HOSPITAL_COMMUNITY): Payer: Medicare Other

## 2016-02-07 ENCOUNTER — Ambulatory Visit (HOSPITAL_COMMUNITY)
Admission: RE | Admit: 2016-02-07 | Discharge: 2016-02-07 | Disposition: A | Discharge status: Home / Self Care | Payer: Medicare Other | Source: Ambulatory Visit | Attending: Ophthalmology | Admitting: Ophthalmology

## 2016-02-07 ENCOUNTER — Encounter (HOSPITAL_COMMUNITY)
Admission: RE | Disposition: A | Discharge status: Home / Self Care | Payer: Self-pay | Source: Ambulatory Visit | Attending: Ophthalmology

## 2016-02-07 ENCOUNTER — Encounter (HOSPITAL_COMMUNITY): Payer: Self-pay | Admitting: *Deleted

## 2016-02-07 ENCOUNTER — Ambulatory Visit (HOSPITAL_COMMUNITY): Payer: Medicare Other | Admitting: Anesthesiology

## 2016-02-07 DIAGNOSIS — Z87891 Personal history of nicotine dependence: Secondary | ICD-10-CM | POA: Insufficient documentation

## 2016-02-07 DIAGNOSIS — E1136 Type 2 diabetes mellitus with diabetic cataract: Secondary | ICD-10-CM | POA: Diagnosis not present

## 2016-02-07 DIAGNOSIS — H2181 Floppy iris syndrome: Secondary | ICD-10-CM | POA: Diagnosis not present

## 2016-02-07 DIAGNOSIS — G4730 Sleep apnea, unspecified: Secondary | ICD-10-CM | POA: Insufficient documentation

## 2016-02-07 DIAGNOSIS — I10 Essential (primary) hypertension: Secondary | ICD-10-CM | POA: Insufficient documentation

## 2016-02-07 DIAGNOSIS — Z794 Long term (current) use of insulin: Secondary | ICD-10-CM | POA: Insufficient documentation

## 2016-02-07 DIAGNOSIS — I2510 Atherosclerotic heart disease of native coronary artery without angina pectoris: Secondary | ICD-10-CM | POA: Insufficient documentation

## 2016-02-07 DIAGNOSIS — K219 Gastro-esophageal reflux disease without esophagitis: Secondary | ICD-10-CM | POA: Diagnosis not present

## 2016-02-07 DIAGNOSIS — Z79899 Other long term (current) drug therapy: Secondary | ICD-10-CM | POA: Insufficient documentation

## 2016-02-07 HISTORY — PX: CATARACT EXTRACTION W/PHACO: SHX586

## 2016-02-07 LAB — GLUCOSE, CAPILLARY: Glucose-Capillary: 129 mg/dL — ABNORMAL HIGH (ref 65–99)

## 2016-02-07 SURGERY — PHACOEMULSIFICATION, CATARACT, WITH IOL INSERTION
Anesthesia: Monitor Anesthesia Care | Site: Eye | Laterality: Left

## 2016-02-07 MED ORDER — EPINEPHRINE PF 1 MG/ML IJ SOLN
INTRAMUSCULAR | Status: AC
  Filled 2016-02-07: qty 1

## 2016-02-07 MED ORDER — MIDAZOLAM HCL 5 MG/5ML IJ SOLN
INTRAMUSCULAR | Status: DC | PRN
  Administered 2016-02-07: 1 mg via INTRAVENOUS

## 2016-02-07 MED ORDER — SODIUM HYALURONATE 23 MG/ML IO SOLN
INTRAOCULAR | Status: DC | PRN
  Administered 2016-02-07: 0.6 mL via INTRAOCULAR

## 2016-02-07 MED ORDER — POVIDONE-IODINE 5 % OP SOLN
OPHTHALMIC | Status: DC | PRN
  Administered 2016-02-07: 1 via OPHTHALMIC

## 2016-02-07 MED ORDER — TETRACAINE HCL 0.5 % OP SOLN
1.0000 [drp] | OPHTHALMIC | Status: AC
  Administered 2016-02-07 (×3): 1 [drp] via OPHTHALMIC

## 2016-02-07 MED ORDER — EPINEPHRINE PF 1 MG/ML IJ SOLN
INTRAOCULAR | Status: DC | PRN
  Administered 2016-02-07: 500 mL

## 2016-02-07 MED ORDER — PHENYLEPHRINE HCL 2.5 % OP SOLN
1.0000 [drp] | OPHTHALMIC | Status: AC
  Administered 2016-02-07 (×3): 1 [drp] via OPHTHALMIC

## 2016-02-07 MED ORDER — BSS IO SOLN
INTRAOCULAR | Status: DC | PRN
  Administered 2016-02-07: 15 mL

## 2016-02-07 MED ORDER — NEOMYCIN-POLYMYXIN-DEXAMETH 3.5-10000-0.1 OP SUSP
OPHTHALMIC | Status: DC | PRN
  Administered 2016-02-07: 2 [drp] via OPHTHALMIC

## 2016-02-07 MED ORDER — CYCLOPENTOLATE-PHENYLEPHRINE 0.2-1 % OP SOLN
1.0000 [drp] | OPHTHALMIC | Status: AC
  Administered 2016-02-07 (×3): 1 [drp] via OPHTHALMIC

## 2016-02-07 MED ORDER — MIDAZOLAM HCL 2 MG/2ML IJ SOLN
INTRAMUSCULAR | Status: AC
  Filled 2016-02-07: qty 2

## 2016-02-07 MED ORDER — MIDAZOLAM HCL 2 MG/2ML IJ SOLN
1.0000 mg | INTRAMUSCULAR | Status: DC | PRN
  Administered 2016-02-07: 2 mg via INTRAVENOUS

## 2016-02-07 MED ORDER — PROVISC 10 MG/ML IO SOLN
INTRAOCULAR | Status: DC | PRN
  Administered 2016-02-07: 0.85 mL via INTRAOCULAR

## 2016-02-07 MED ORDER — LIDOCAINE HCL 3.5 % OP GEL
1.0000 "application " | Freq: Once | OPHTHALMIC | Status: AC
  Administered 2016-02-07: 1 via OPHTHALMIC

## 2016-02-07 MED ORDER — LACTATED RINGERS IV SOLN
INTRAVENOUS | Status: DC
  Administered 2016-02-07: 07:00:00 via INTRAVENOUS

## 2016-02-07 MED ORDER — EPINEPHRINE PF 1 MG/ML IJ SOLN
INTRAOCULAR | Status: DC | PRN
  Administered 2016-02-07: 1 mL via OPHTHALMIC

## 2016-02-07 SURGICAL SUPPLY — 14 items
CLOTH BEACON ORANGE TIMEOUT ST (SAFETY) ×3 IMPLANT
EYE SHIELD UNIVERSAL CLEAR (GAUZE/BANDAGES/DRESSINGS) ×3 IMPLANT
GLOVE BIOGEL PI IND STRL 6.5 (GLOVE) ×1 IMPLANT
GLOVE BIOGEL PI IND STRL 8 (GLOVE) ×1 IMPLANT
GLOVE BIOGEL PI INDICATOR 6.5 (GLOVE) ×2
GLOVE BIOGEL PI INDICATOR 8 (GLOVE) ×2
LENS ALC ACRYL/TECN (Ophthalmic Related) ×3 IMPLANT
PAD ARMBOARD 7.5X6 YLW CONV (MISCELLANEOUS) ×3 IMPLANT
RING MALYGIN (MISCELLANEOUS) ×3 IMPLANT
SYR TB 1ML LL NO SAFETY (SYRINGE) ×3 IMPLANT
TAPE SURG TRANSPORE 1 IN (GAUZE/BANDAGES/DRESSINGS) ×1 IMPLANT
TAPE SURGICAL TRANSPORE 1 IN (GAUZE/BANDAGES/DRESSINGS) ×2
VISCOELASTIC ADDITIONAL (OPHTHALMIC RELATED) ×3 IMPLANT
WATER STERILE IRR 250ML POUR (IV SOLUTION) ×3 IMPLANT

## 2016-02-07 NOTE — Anesthesia Preprocedure Evaluation (Signed)
Anesthesia Evaluation  Patient identified by MRN, date of birth, ID band Patient awake    Reviewed: Allergy & Precautions, H&P , NPO status , Patient's Chart, lab work & pertinent test results  Airway Mallampati: II  TM Distance: >3 FB Neck ROM: Full    Dental  (+) Edentulous Upper, Edentulous Lower   Pulmonary shortness of breath, sleep apnea , former smoker,    Pulmonary exam normal breath sounds clear to auscultation       Cardiovascular hypertension, Pt. on medications + CAD  negative cardio ROS   Rhythm:Regular Rate:Normal     Neuro/Psych negative neurological ROS  negative psych ROS   GI/Hepatic Neg liver ROS, GERD  Medicated,  Endo/Other  diabetes, Type 2, Insulin Dependent  Renal/GU negative Renal ROS  negative genitourinary   Musculoskeletal negative musculoskeletal ROS (+)   Abdominal   Peds negative pediatric ROS (+)  Hematology negative hematology ROS (+)   Anesthesia Other Findings More SOB today than usual for pt, resolving recent URI.  Reproductive/Obstetrics negative OB ROS                             Anesthesia Physical Anesthesia Plan  ASA: III  Anesthesia Plan: MAC   Post-op Pain Management:    Induction: Intravenous  Airway Management Planned: Simple Face Mask  Additional Equipment:   Intra-op Plan:   Post-operative Plan:   Informed Consent: I have reviewed the patients History and Physical, chart, labs and discussed the procedure including the risks, benefits and alternatives for the proposed anesthesia with the patient or authorized representative who has indicated his/her understanding and acceptance.     Plan Discussed with:   Anesthesia Plan Comments:         Anesthesia Quick Evaluation

## 2016-02-07 NOTE — H&P (Signed)
The H and P was reviewed and updated. The patient was examined.  No changes were found after exam.  The surgical eye was marked.  

## 2016-02-07 NOTE — Transfer of Care (Signed)
Immediate Anesthesia Transfer of Care Note  Patient: [name]  Procedure(s) Performed: Procedure(s) with comments: CATARACT EXTRACTION PHACO AND INTRAOCULAR LENS PLACEMENT (IOC) (Left) - CDE:  23.13  Patient Location: Short Stay  Anesthesia Type:MAC  Level of Consciousness: awake, alert , oriented and patient cooperative  Airway & Oxygen Therapy: Patient Spontanous Breathing  Post-op Assessment: Report given to RN and Post -op Vital signs reviewed and stable  Post vital signs: Reviewed and stable  Last Vitals:  Vitals:   02/07/16 0800 02/07/16 0805  BP: 135/79 133/72  Pulse:    Resp: 10 13  Temp:      Last Pain:  Vitals:   02/07/16 0714  TempSrc: Oral      Patients Stated Pain Goal: 8 (64/33/29 5188)  Complications: No apparent anesthesia complications

## 2016-02-07 NOTE — Discharge Instructions (Signed)
Moderate Conscious Sedation, Adult, Care After These instructions provide you with information about caring for yourself after your procedure. Your health care provider may also give you more specific instructions. Your treatment has been planned according to current medical practices, but problems sometimes occur. Call your health care provider if you have any problems or questions after your procedure. What can I expect after the procedure? After your procedure, it is common:  To feel sleepy for several hours.  To feel clumsy and have poor balance for several hours.  To have poor judgment for several hours.  To vomit if you eat too soon. Follow these instructions at home: For at least 24 hours after the procedure:   Do not:  Participate in activities where you could fall or become injured.  Drive.  Use heavy machinery.  Drink alcohol.  Take sleeping pills or medicines that cause drowsiness.  Make important decisions or sign legal documents.  Take care of children on your own.  Rest. Eating and drinking  Follow the diet recommended by your health care provider.  If you vomit:  Drink water, juice, or soup when you can drink without vomiting.  Make sure you have little or no nausea before eating solid foods. General instructions  Have a responsible adult stay with you until you are awake and alert.  Take over-the-counter and prescription medicines only as told by your health care provider.  If you smoke, do not smoke without supervision.  Keep all follow-up visits as told by your health care provider. This is important. Contact a health care provider if:  You keep feeling nauseous or you keep vomiting.  You feel light-headed.  You develop a rash.  You have a fever. Get help right away if:  You have trouble breathing. This information is not intended to replace advice given to you by your health care provider. Make sure you discuss any questions you have  with your health care provider. Document Released: 10/12/2012 Document Revised: 05/27/2015 Document Reviewed: 04/13/2015 Elsevier Interactive Patient Education  2017 [place].  Please discharge patient when stable, will follow up today with Dr. Marisa Hua at the [facility] office at 9:15AM.  Leave shield in place until visit.  All paperwork with discharge instructions will be given at the office.

## 2016-02-07 NOTE — Op Note (Signed)
Date of procedure:   Pre-operative diagnosis: Visually significant cataract, Left Eye  Post-operative diagnosis: Visually significant cataract, Left Eye; Intra-operative floppy iris syndrome  Procedure: Removal of cataract via phacoemulsification and insertion of intra-ocular lens AMO PCB00 +17.5D into the capsular bag of the Left Eye  Attending surgeon: Gerda Diss. Chasitee Zenker, MD, MA  Anesthesia: MAC, Topical Akten  Complications: None  Estimated Blood Loss: <32m (minimal)  Specimens: None  Implants: As above  Indications:  Visually significant cataract, Left Eye  Procedure:  The patient was seen and identified in the pre-operative area. The operative eye was identified and dilated.  The operative eye was marked.  Topical anesthesia was administered to the operative eye.     The patient was then to the operative suite and placed in the supine position.  A timeout was performed confirming the patient, procedure to be performed, and all other relevant information.   The patient's face was prepped and draped in the usual fashion for intra-ocular surgery.  A lid speculum was placed into the operative eye and the surgical microscope moved into place and focused.  Poor dilation was confirmed.  A superotemporal paracentesis was created using a 15 degree blade.  Shugarcaine was injected into the anterior chamber.  Viscoelastic was injected into the anterior chamber.  A temporal clear-corneal main wound incision was created using a 2.428mmicrokeratome.  A 6.25 Malyugan ring was placed.  A continuous curvilinear capsulorrhexis was initiated using an irrigating cystitome and completed using capsulorrhexis forceps.  Hydrodissection and hydrodeliniation were performed.  Viscoelastic was injected into the anterior chamber.  A phacoemulsification handpiece and a chopper as a second instrument were used to remove the nucleus and epinucleus. The irrigation/aspiration handpiece was used to remove any remaining  cortical material.   The capsular bag was reinflated with viscoelastic, checked, and found to be intact. The AMO PCB00 intraocular lens was inserted into the capsular bag and dialed into place using a Sinskey hook.  The Malyugan ring was removed.  The irrigation/aspiration handpiece was used to remove any remaining viscoelastic.  The clear corneal wound and paracentesis wounds were then hydrated and checked with Weck-Cels to be watertight.  The lid-speculum and drape was removed, and the patient's face was cleaned with a wet and dry 4x4.  Maxitrol was instilled in the eye before a clear shield was taped over the eye. The patient was taken to the post-operative care unit in good condition, having tolerated the procedure well.  Post-Op Instructions: [facility] and will receive all other orders and instructions.

## 2016-02-07 NOTE — Anesthesia Postprocedure Evaluation (Signed)
Anesthesia Post Note  Patient: [name]  Procedure(s) Performed: Procedure(s) (LRB): CATARACT EXTRACTION PHACO AND INTRAOCULAR LENS PLACEMENT (IOC) (Left)  Patient location during evaluation: Short Stay Anesthesia Type: MAC Level of consciousness: awake and alert and oriented Pain management: pain level controlled Vital Signs Assessment: post-procedure vital signs reviewed and stable Cardiovascular status: stable Postop Assessment: no signs of nausea or vomiting Anesthetic complications: no     Last Vitals:  Vitals:   02/07/16 0800 02/07/16 0805  BP: 135/79 133/72  Pulse:    Resp: 10 13  Temp:      Last Pain:  Vitals:   02/07/16 0714  TempSrc: Oral                 Kawthar Ennen A

## 2016-02-10 ENCOUNTER — Encounter (HOSPITAL_COMMUNITY): Payer: Self-pay | Admitting: Ophthalmology

## 2016-02-18 ENCOUNTER — Ambulatory Visit (HOSPITAL_BASED_OUTPATIENT_CLINIC_OR_DEPARTMENT_OTHER)
Admission: RE | Admit: 2016-02-18 | Discharge: 2016-02-18 | Disposition: A | Discharge status: Home / Self Care | Payer: Medicare Other | Source: Ambulatory Visit

## 2016-02-18 ENCOUNTER — Ambulatory Visit (HOSPITAL_COMMUNITY)
Admission: RE | Admit: 2016-02-18 | Discharge: 2016-02-18 | Disposition: A | Discharge status: Home / Self Care | Payer: Medicare Other | Source: Ambulatory Visit | Attending: Family Medicine | Admitting: Family Medicine

## 2016-02-18 DIAGNOSIS — I4891 Unspecified atrial fibrillation: Secondary | ICD-10-CM

## 2016-02-18 DIAGNOSIS — R9439 Abnormal result of other cardiovascular function study: Secondary | ICD-10-CM | POA: Insufficient documentation

## 2016-02-18 DIAGNOSIS — I340 Nonrheumatic mitral (valve) insufficiency: Secondary | ICD-10-CM | POA: Insufficient documentation

## 2016-02-18 DIAGNOSIS — I358 Other nonrheumatic aortic valve disorders: Secondary | ICD-10-CM | POA: Insufficient documentation

## 2016-02-18 MED ORDER — PERFLUTREN LIPID MICROSPHERE
1.0000 mL | INTRAVENOUS | Status: AC | PRN
  Administered 2016-02-18: 1 mL via INTRAVENOUS
  Filled 2016-02-18: qty 10

## 2016-02-18 NOTE — Progress Notes (Signed)
*  PRELIMINARY RESULTS* Echocardiogram 2D Echocardiogram with definity has been performed.  Leavy Cella 02/18/2016, 3:08 PM

## 2016-02-19 ENCOUNTER — Encounter: Payer: Self-pay | Admitting: Cardiology

## 2016-02-19 NOTE — Progress Notes (Signed)
Cardiology Office Note  Date: 02/20/2016   ID: YUVIN DUBEL, DOB [birth year], MRN [id number]  PCP: Maricela Curet, MD  Consulting Cardiologist: Rozann Lesches, MD   Chief Complaint  Patient presents with  . Atrial Fibrillation    History of Present Illness: [name] is an 83 y.o. male referred for cardiology consultation by Dr. Cindie Laroche. I reviewed extensive records and updated his chart. He was recently found to be in atrial fibrillation at office visit. He is here with family members today. States that he has been more short of breath than normal over the last few months, also has had some worsening leg edema. Reports occasional angina symptoms, does not use nitroglycerin anymore than about twice in 6 months.  Records indicate prior follow-up by Dr. Debara Pickett, not seen since 2014 in our practice. He has a history of multivessel CAD status post remote PCI and ultimately CABG in 2011. No prior documented cardiac arrhythmia, although I did see an ECG from December 2017 that showed atrial fibrillation.  Recent ECG from February 5 showed rate-controlled atrial fibrillation with occasional PVCs versus aberrantly conducted complexes, and nonspecific ST-T changes. He does not necessarily feel palpitations at this time, mainly dyspnea on exertion.  CHADSVASC score is 5. He has been placed on Eliquis and Toprol-XL by Dr. Cindie Laroche.  Past Medical History:  Diagnosis Date  . BPH (benign prostatic hyperplasia)   . CAD (coronary artery disease)    Multivessel status post CABG 2011 - LIMA to LAD, SVG to diagonal, SVG to OM, SVG to PDA  . Cellulitis    12/15  . Chronic back pain   . Essential hypertension   . Gastric mass    EGD 9/15  . GERD (gastroesophageal reflux disease)   . History of DVT (deep vein thrombosis)    Postphlebitic syndrome  . History of kidney stones   . HOH (hard of hearing)   . Hx of CABG   . Hyperlipidemia   . Sleep apnea    Stop Bang score of 5  . Type 2  diabetes mellitus ([place])     Past Surgical History:  Procedure Laterality Date  . CATARACT EXTRACTION W/PHACO Left 02/07/2016   Procedure: CATARACT EXTRACTION PHACO AND INTRAOCULAR LENS PLACEMENT (IOC);  Surgeon: [name], MD;  Location: [facility];  Service: Ophthalmology;  Laterality: Left;  CDE:  23.13  . COLONOSCOPY  2004   Dr. Laural Golden: three small polyps at cecum, path unknown, external hemorrhoids  . COLONOSCOPY N/A 08/16/2013   Dr. [name]: incomplete prep. multiple tubular adenomas, multiple biopsies. Needs surveillance in Aug 2016 due to poor prep  . CORONARY ARTERY BYPASS GRAFT     x5  . CORONARY ARTERY BYPASS GRAFT  2011  . CYSTOSCOPY N/A 12/12/2012   Procedure: CYSTOSCOPY FLEXIBLE;  Surgeon: Marissa Nestle, MD;  Location: [facility];  Service: Urology;  Laterality: N/A;  . ESOPHAGOGASTRODUODENOSCOPY N/A 08/16/2013   Dr. [name]: normal esophagus s/p Maloney dilation, gastric erosions, submucosal gastric mass vs extrinsic mass  . EUS N/A 09/07/2013   Dr. 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 INCISION AND DRAINAGE ABSCESS N/A 12/20/2013   Procedure: INCISION AND DRAINAGE ABSCESS NECK;  Surgeon: Jamesetta So, MD;  Location: [facility];  Service: General;  Laterality: N/A;  . Venia Minks DILATION N/A 08/16/2013   Procedure: Keturah Shavers;  Surgeon: Daneil Dolin, MD;  Location: [facility] SUITE;  Service: Endoscopy;  Laterality: N/A;    Current Outpatient  Prescriptions  Medication Sig Dispense Refill  . amLODipine-olmesartan (AZOR) 5-40 MG per tablet Take 1 tablet by mouth daily.    [name] apixaban (ELIQUIS) 5 MG TABS tablet Take 5 mg by mouth 2 (two) times daily.    [name] Besifloxacin HCl (BESIVANCE) 0.6 % SUSP Place 1 drop into the left eye 2 (two) times daily.     . Cholecalciferol (VITAMIN D3) 400 units tablet Take 400 Units by mouth daily.    . cloNIDine (CATAPRES) 0.1 MG tablet Take 0.1 mg by mouth 2 (two) times daily.    [name] dextromethorphan-guaiFENesin (MUCINEX DM) 30-600  MG 12hr tablet Take 1 tablet by mouth 2 (two) times daily.    . furosemide (LASIX) 20 MG tablet Take 20 mg by mouth 2 (two) times daily.    . Garlic [id number] MG CAPS Take 500 mg by mouth daily.    . insulin aspart (NOVOLOG FLEXPEN) 100 UNIT/ML FlexPen Inject 20 Units into the skin 2 (two) times daily with a meal.    . Insulin Glargine (LANTUS SOLOSTAR) 100 UNIT/ML Solostar Pen Inject 45 Units into the skin daily at 6 PM.     . linaclotide (LINZESS) 145 MCG CAPS capsule Take 145 mcg by mouth daily as needed (for constipation.).    [name] metoprolol succinate (TOPROL-XL) 50 MG 24 hr tablet Take 50 mg by mouth daily. Take with or immediately following a meal.    . nepafenac (ILEVRO) 0.3 % ophthalmic suspension Place 1 drop into the left eye daily.    . nitroGLYCERIN (NITROSTAT) 0.4 MG SL tablet Place 1 tablet (0.4 mg total) under the tongue every 5 (five) minutes as needed for chest pain. 30 tablet 12  . Omega-3 Fatty Acids (OMEGA 3 PO) Take 1 capsule by mouth daily.    . pantoprazole (PROTONIX) 40 MG tablet Take 40 mg by mouth daily.    . prednisoLONE acetate (PRED FORTE) 1 % ophthalmic suspension Place 1 drop into the left eye as directed. Use 3 times daily for 1 week, then 2 times daily for 1 week.    . pregabalin (LYRICA) 50 MG capsule Take 50 mg by mouth daily.    . rosuvastatin (CRESTOR) 10 MG tablet Take 10 mg by mouth daily.    . silodosin (RAPAFLO) 8 MG CAPS capsule Take 8 mg by mouth daily with breakfast.    . timolol (BETIMOL) 0.5 % ophthalmic solution Place 1 drop into the left eye daily.     No current facility-administered medications for this visit.    Allergies:  Patient has no known allergies.   Social History: The patient  reports that he quit smoking about 60 years ago. He has a 11.00 pack-year smoking history. He has never used smokeless tobacco. He reports that he does not drink alcohol or use drugs.   Family History: The patient's family history includes Colon cancer (age of onset:  63) in his son.   ROS:  Please see the history of present illness. Otherwise, complete review of systems is positive for chronic leg swelling and venous insufficiency.  All other systems are reviewed and negative.   Physical Exam: VS:  BP 114/70 (BP Location: Left Arm)   Pulse 92   Ht 5\' 11"  (1.803 m)   Wt 237 lb (107.5 kg)   SpO2 94%   BMI 33.05 kg/m , BMI Body mass index is 33.05 kg/m.  Wt Readings from Last 3 Encounters:  02/20/16 237 lb (107.5 kg)  02/07/16 230 lb (104.3 kg)  01/03/16  230 lb 9.6 oz (104.6 kg)    General: Overweight elderly male, appears comfortable at rest. HEENT: Conjunctiva and lids normal, oropharynx clear with poor dentition. Neck: Supple, no elevated JVP or carotid bruits, no thyromegaly. Lungs: Diminished breath sounds without wheezing, nonlabored breathing at rest. Cardiac: Irregularly irregular, no S3, soft systolic murmur, no pericardial rub. Abdomen: Obese, nontender, bowel sounds present. Extremities: Chrronic appearing edema and venous stasis changes, distal pulses 1-2+. Skin: Warm and dry. Musculoskeletal: No kyphosis. Neuropsychiatric: Alert and oriented x3, affect grossly appropriate.  ECG: I personally reviewed the tracing from 01/03/2016 which showed rate-controlled atrial fibrillation with nonspecific ST-T changes.  Recent Labwork: 01/03/2016: BUN 33; Creatinine, Ser 1.53; Hemoglobin 15.4; Platelets 173; Potassium 4.0; Sodium 140     Component Value Date/Time   CHOL 140 12/18/2013 1008   TRIG 142 12/18/2013 1008   HDL 30 (L) 12/18/2013 1008   CHOLHDL 4.7 12/18/2013 1008   VLDL 28 12/18/2013 1008   LDLCALC 82 12/18/2013 1008    Other Studies Reviewed Today:  Echocardiogram 02/18/2016: Study Conclusions  - Left ventricle: The cavity size was normal. Wall thickness was   increased in a pattern of moderate to severe LVH. Systolic   function was normal. The estimated ejection fraction was in the   range of 60% to 65%. Wall motion  was normal; there were no   regional wall motion abnormalities. The study was not technically   sufficient to allow evaluation of LV diastolic dysfunction due to   atrial fibrillation. - Aortic valve: Mildly calcified annulus. Trileaflet; mildly   thickened leaflets. Mean gradient (S): 4 mm Hg. Valve area (VTI):   2.1 cm^2. Valve area (Vmax): 1.96 cm^2. - Mitral valve: Mildly calcified annulus. Mildly thickened leaflets   . There was mild regurgitation. - Left atrium: The atrium was mildly dilated. - Right atrium: The atrium was mildly dilated. - Technically difficult study. Echocontrast was used to enhance   visualization.  Assessment and Plan:  1. Persistent atrial fibrillation with CHADSVASC score of 5. Onset is uncertain but looks to have been present back in December which would fit with his description of more noticeable dyspnea on exertion. Agree with his current management strategy including Eliquis and Toprol-XL for heart rate control. As discussed below, we will follow-up with ischemic testing first and then bring him back to see whether we need to consider a cardioversion. I asked him to stop his aspirin for now.  2. Multivessel CAD status post prior PCI's and CABG. We will obtain a follow-up Lexiscan Myoview to assess ischemic burden.  3. History of prior DVT and postphlebitic syndrome. He has had chronic leg edema, currently on Lasix. Compression hose may also be useful. Recent echocardiogram shows normal LVEF at 60-65%.  4. Hyperlipidemia, on Crestor.  Current medicines were reviewed with the patient today.   Orders Placed This Encounter  Procedures  . NM Myocar Multi W/Spect W/Wall Motion / EF    Disposition: Follow-up after testing.  Signed, Satira Sark, MD, [facility] 02/20/2016 9:32 AM    [facility] [address], [place],  [address] Phone: [phone]; Fax: [phone]

## 2016-02-20 ENCOUNTER — Encounter: Payer: Self-pay | Admitting: Cardiology

## 2016-02-20 ENCOUNTER — Ambulatory Visit (INDEPENDENT_AMBULATORY_CARE_PROVIDER_SITE_OTHER): Payer: Medicare Other | Admitting: Cardiology

## 2016-02-20 VITALS — BP 114/70 | HR 92 | Ht 71.0 in | Wt 237.0 lb

## 2016-02-20 DIAGNOSIS — R0602 Shortness of breath: Secondary | ICD-10-CM

## 2016-02-20 DIAGNOSIS — I25119 Atherosclerotic heart disease of native coronary artery with unspecified angina pectoris: Secondary | ICD-10-CM

## 2016-02-20 DIAGNOSIS — E782 Mixed hyperlipidemia: Secondary | ICD-10-CM

## 2016-02-20 DIAGNOSIS — I481 Persistent atrial fibrillation: Secondary | ICD-10-CM

## 2016-02-20 DIAGNOSIS — I87009 Postthrombotic syndrome without complications of unspecified extremity: Secondary | ICD-10-CM | POA: Diagnosis not present

## 2016-02-20 DIAGNOSIS — I4819 Other persistent atrial fibrillation: Secondary | ICD-10-CM

## 2016-02-20 NOTE — Patient Instructions (Signed)
Your physician recommends that you schedule a follow-up appointment in: 3 weeks Dr Domenic Polite   STOP Aspirin    Your physician has requested that you have a lexiscan myoview. For further information please visit [email or web address]. Please follow instruction sheet, as given.      Thank you for choosing [facility] !

## 2016-02-27 ENCOUNTER — Encounter (HOSPITAL_COMMUNITY)
Admission: RE | Admit: 2016-02-27 | Discharge: 2016-02-27 | Disposition: A | Discharge status: Home / Self Care | Payer: Medicare Other | Source: Ambulatory Visit | Attending: Cardiology | Admitting: Cardiology

## 2016-02-27 ENCOUNTER — Inpatient Hospital Stay (HOSPITAL_COMMUNITY): Admission: RE | Admit: 2016-02-27 | Payer: Medicare Other | Source: Ambulatory Visit

## 2016-02-27 ENCOUNTER — Encounter (HOSPITAL_COMMUNITY): Payer: Self-pay

## 2016-02-27 DIAGNOSIS — I25119 Atherosclerotic heart disease of native coronary artery with unspecified angina pectoris: Secondary | ICD-10-CM

## 2016-02-27 DIAGNOSIS — R0602 Shortness of breath: Secondary | ICD-10-CM | POA: Diagnosis not present

## 2016-02-27 LAB — NM MYOCAR MULTI W/SPECT W/WALL MOTION / EF
CHL CUP NUCLEAR SDS: 2
CHL CUP NUCLEAR SRS: 1
CHL CUP RESTING HR STRESS: 99 {beats}/min
LHR: 0.12
LV dias vol: 72 mL (ref 62–150)
LV sys vol: 33 mL
NUC STRESS TID: 1.04
Peak HR: 120 {beats}/min
SSS: 3

## 2016-02-27 MED ORDER — SODIUM CHLORIDE 0.9% FLUSH
INTRAVENOUS | Status: AC
  Administered 2016-02-27: 10 mL via INTRAVENOUS
  Filled 2016-02-27: qty 10

## 2016-02-27 MED ORDER — REGADENOSON 0.4 MG/5ML IV SOLN
INTRAVENOUS | Status: AC
  Administered 2016-02-27: 0.4 mg via INTRAVENOUS
  Filled 2016-02-27: qty 5

## 2016-02-27 MED ORDER — TECHNETIUM TC 99M TETROFOSMIN IV KIT
30.0000 | PACK | Freq: Once | INTRAVENOUS | Status: AC | PRN
  Administered 2016-02-27: 31 via INTRAVENOUS

## 2016-02-27 MED ORDER — TECHNETIUM TC 99M TETROFOSMIN IV KIT
10.0000 | PACK | Freq: Once | INTRAVENOUS | Status: AC | PRN
  Administered 2016-02-27: 11 via INTRAVENOUS

## 2016-02-28 NOTE — Patient Instructions (Signed)
[name]  02/28/2016     @PREFPERIOPPHARMACY @   Your procedure is scheduled on 03/06/2016.  Report to [facility] at 1100 A.M.  Call this number if you have problems the morning of surgery:  [phone]   Remember:  Do not eat food or drink liquids after midnight.  Take these medicines the morning of surgery with A SIP OF WATER : Azor, Catapress, Metoprolol, Protonix, Lyrica and Rapaflo.  Please 1/2 your insulin dosage the night before surgery.  Do not take any diabetic medications the morning of surgery.   Do not wear jewelry, make-up or nail polish.  Do not wear lotions, powders, or perfumes, or deoderant.  Do not shave 48 hours prior to surgery.  Men may shave face and neck.  Do not bring valuables to the hospital.  [facility] is not responsible for any belongings or valuables.  Contacts, dentures or bridgework may not be worn into surgery.  Leave your suitcase in the car.  After surgery it may be brought to your room.  For patients admitted to the hospital, discharge time will be determined by your treatment team.  Patients discharged the day of surgery will not be allowed to drive home.   Name and phone number of your driver:   Family Special instructions:  n/a  Please read over the following fact sheets that you were given. Care and Recovery After Surgery    Cataract Surgery Cataract surgery is a procedure to remove a cataract from your eye. A cataract is cloudiness on the lens of your eye. The lens focuses light inside the eye. When a lens becomes cloudy, your vision is affected. Cataract surgery is a procedure to remove the cloudy lens. A substitute lens (intraocular lens or IOL) is usually inserted as a replacement for the cloudy lens. Tell a health care provider about:  Any allergies you have.  All medicines you are taking, including vitamins, herbs, eye drops, creams, and over-the-counter medicines.  Any problems you or family members have had with  anesthetic medicines.  Any blood disorders you have.  Any surgeries you have had, especially eye surgeries that include refractive surgery, such as PRK and LASIK.  Any medical conditions you have.  Whether you are pregnant or may be pregnant. What are the risks? Generally, this is a safe procedure. However, problems may occur, including:  Infection.  Bleeding.  Glaucoma.  Retinal detachment.  Allergic reactions to medicines.  Damage to other structures or organs.  Inflammation of the eye.  Clouding of the part of your eye that holds an IOL in place (after-cataract), if an IOL was inserted. This is fairly common.  An IOL moving out of position, if an IOL was inserted. This is very rare.  Loss of vision. This is rare. What happens before the procedure?  Follow instructions from your health care provider about eating or drinking restrictions.  Ask your health care provider about:  Changing or stopping your regular medicines, including any eye drops you have been prescribed. This is especially important if you are taking diabetes medicines or blood thinners.  Taking medicines such as aspirin and ibuprofen. These medicines can thin your blood. Do not take these medicines before your procedure if your health care provider instructs you not to.  Do not put contact lenses in either eye on the day of your surgery.  Plan for someone to drive you to and from the procedure.  If you will be going home right after the  procedure, plan to have someone with you for 24 hours. What happens during the procedure?  An IV tube may be inserted into one of your veins.  You will be given one or more of the following:  A medicine to help you relax (sedative).  A medicine to numb the area (local anesthetic). This may be numbing eye drops or an injection that is given behind the eye.  A small cut (incision) will be made to the edge of the clear, dome-shaped surface that covers the front of  the eye (cornea).  A small probe will be inserted into the eye. This device gives off ultrasound waves that soften and break up the cloudy center of the lens. This makes it easier for the cloudy lens to be removed by suction.  An IOL may be implanted.  Part of the capsule that surrounds the lens will be left in the eye to support the IOL.  Your surgeon may use stitches (sutures) to close the incision. The procedure may vary among health care providers and hospitals. What happens after the procedure?  Your blood pressure, heart rate, breathing rate, and blood oxygen level will be monitored often until the medicines you were given have worn off.  You may be given a protective shield to wear over your eyes.  Do not drive for 24 hours if you received a sedative. This information is not intended to replace advice given to you by your health care provider. Make sure you discuss any questions you have with your health care provider. Document Released: 12/11/2010 Document Revised: 05/30/2015 Document Reviewed: 11/01/2014 Elsevier Interactive Patient Education  2017 [employer].

## 2016-03-02 ENCOUNTER — Encounter (HOSPITAL_COMMUNITY)
Admission: RE | Admit: 2016-03-02 | Discharge: 2016-03-02 | Disposition: A | Discharge status: Home / Self Care | Payer: Medicare Other | Source: Ambulatory Visit | Attending: Ophthalmology | Admitting: Ophthalmology

## 2016-03-02 ENCOUNTER — Encounter (HOSPITAL_COMMUNITY): Payer: Self-pay

## 2016-03-06 ENCOUNTER — Ambulatory Visit (HOSPITAL_COMMUNITY): Admission: RE | Admit: 2016-03-06 | Payer: Medicare Other | Source: Ambulatory Visit | Admitting: Ophthalmology

## 2016-03-06 ENCOUNTER — Encounter (HOSPITAL_COMMUNITY): Admission: RE | Payer: Self-pay | Source: Ambulatory Visit

## 2016-03-06 SURGERY — PHACOEMULSIFICATION, CATARACT, WITH IOL INSERTION
Anesthesia: Monitor Anesthesia Care | Laterality: Right

## 2016-03-18 NOTE — Progress Notes (Signed)
Cardiology Office Note  Date: 03/19/2016   ID: PACEN WATFORD, DOB December 20, 1931, MRN [id number]  PCP: Maricela Curet, MD  Primary Cardiologist: Rozann Lesches, MD   Chief Complaint  Patient presents with  . Atrial Fibrillation    History of Present Illness: LORRY ANASTASI is an 83 y.o. male that I saw her recently in consultation in February. He presents today with family members for a follow-up visit. States that he has been progressively short of breath with intermittent coughing. He saw Dr. Cindie Laroche and was placed back on Lasix after running out of the medication for several days. Still not feeling any better. He is not reporting any chest pain.  CHADSVASC score is 5. He has been on Eliquis for stroke prophylaxis over the last few months. Also on Toprol-XL for heart rate control. We have discussed the possibility of cardioversion.  Lexiscan Myoview done in February showed no significant ischemia with normal LVEF, low risk study. We discussed the results today. Recent echocardiogram also confirms normal LVEF.  Today we discussed his medications and further management plans. They tell me that Dr. Cindie Laroche had already mentioned hospitalization which I think is reasonable.  Past Medical History:  Diagnosis Date  . BPH (benign prostatic hyperplasia)   . CAD (coronary artery disease)    Multivessel status post CABG 2011 - LIMA to LAD, SVG to diagonal, SVG to OM, SVG to PDA  . Cellulitis    12/15  . Chronic back pain   . Essential hypertension   . Gastric mass    EGD 9/15  . GERD (gastroesophageal reflux disease)   . History of DVT (deep vein thrombosis)    Postphlebitic syndrome  . History of kidney stones   . HOH (hard of hearing)   . Hx of CABG   . Hyperlipidemia   . Sleep apnea    Stop Bang score of 5  . Type 2 diabetes mellitus ([place])     Past Surgical History:  Procedure Laterality Date  . CATARACT EXTRACTION W/PHACO Left 02/07/2016   Procedure: CATARACT  EXTRACTION PHACO AND INTRAOCULAR LENS PLACEMENT (IOC);  Surgeon: [name], MD;  Location: [facility];  Service: Ophthalmology;  Laterality: Left;  CDE:  23.13  . COLONOSCOPY  2004   Dr. Laural Golden: three small polyps at cecum, path unknown, external hemorrhoids  . COLONOSCOPY N/A 08/16/2013   Dr. [name]: incomplete prep. multiple tubular adenomas, multiple biopsies. Needs surveillance in Aug 2016 due to poor prep  . CORONARY ARTERY BYPASS GRAFT     x5  . CORONARY ARTERY BYPASS GRAFT  2011  . CYSTOSCOPY N/A 12/12/2012   Procedure: CYSTOSCOPY FLEXIBLE;  Surgeon: Marissa Nestle, MD;  Location: [facility];  Service: Urology;  Laterality: N/A;  . ESOPHAGOGASTRODUODENOSCOPY N/A 08/16/2013   Dr. [name]: normal esophagus s/p Maloney dilation, gastric erosions, submucosal gastric mass vs extrinsic mass  . EUS N/A 09/07/2013   Dr. 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 INCISION AND DRAINAGE ABSCESS N/A 12/20/2013   Procedure: INCISION AND DRAINAGE ABSCESS NECK;  Surgeon: Jamesetta So, MD;  Location: [facility];  Service: General;  Laterality: N/A;  . Venia Minks DILATION N/A 08/16/2013   Procedure: Keturah Shavers;  Surgeon: Daneil Dolin, MD;  Location: [facility];  Service: Endoscopy;  Laterality: N/A;    Current Outpatient Prescriptions  Medication Sig Dispense Refill  . amLODipine-olmesartan (AZOR) 5-40 MG per tablet Take 1 tablet by mouth daily.    [name] apixaban (  ELIQUIS) 5 MG TABS tablet Take 5 mg by mouth 2 (two) times daily.    . beclomethasone (QVAR) 80 MCG/ACT inhaler Inhale 2 puffs into the lungs 2 (two) times daily.    [name] Besifloxacin HCl (BESIVANCE) 0.6 % SUSP Place 1 drop into the left eye 2 (two) times daily.     . Cholecalciferol (VITAMIN D3) 400 units tablet Take 400 Units by mouth daily.    . cloNIDine (CATAPRES) 0.1 MG tablet Take 0.1 mg by mouth 2 (two) times daily.    [name] dextromethorphan-guaiFENesin (MUCINEX DM) 30-600 MG 12hr tablet Take 1 tablet by mouth 2 (two) times  daily.    . furosemide (LASIX) 20 MG tablet Take 20 mg by mouth 2 (two) times daily.    . insulin aspart (NOVOLOG FLEXPEN) 100 UNIT/ML FlexPen Inject 20 Units into the skin 2 (two) times daily with a meal.    . Insulin Glargine (LANTUS SOLOSTAR) 100 UNIT/ML Solostar Pen Inject 45 Units into the skin daily at 6 PM.     . metoprolol succinate (TOPROL-XL) 50 MG 24 hr tablet Take 50 mg by mouth daily. Take with or immediately following a meal.    . nepafenac (ILEVRO) 0.3 % ophthalmic suspension Place 1 drop into the left eye daily.    . nitroGLYCERIN (NITROSTAT) 0.4 MG SL tablet Place 1 tablet (0.4 mg total) under the tongue every 5 (five) minutes as needed for chest pain. 30 tablet 12  . Omega-3 Fatty Acids (OMEGA 3 PO) Take 1 capsule by mouth daily.    [name] oxymetazoline (AFRIN) 0.05 % nasal spray Place 1 spray into both nostrils 2 (two) times daily.    . pantoprazole (PROTONIX) 40 MG tablet Take 40 mg by mouth daily.    . pregabalin (LYRICA) 50 MG capsule Take 50 mg by mouth daily.    . rosuvastatin (CRESTOR) 10 MG tablet Take 10 mg by mouth daily.    . silodosin (RAPAFLO) 8 MG CAPS capsule Take 8 mg by mouth daily with breakfast.    . timolol (BETIMOL) 0.5 % ophthalmic solution Place 1 drop into the left eye daily.     No current facility-administered medications for this visit.    Allergies:  Patient has no known allergies.   Social History: The patient  reports that he quit smoking about 60 years ago. He has a 11.00 pack-year smoking history. He has never used smokeless tobacco. He reports that he does not drink alcohol or use drugs.   Family History: The patient's family history includes Colon cancer (age of onset: 9) in his son.   ROS:  Please see the history of present illness. Otherwise, complete review of systems is positive for fatigue, shortness of breath.  All other systems are reviewed and negative.   Physical Exam: VS:  BP (!) 120/58   Pulse 92   Ht 5' 11.5" (1.816 m)   Wt 235  lb (106.6 kg)   SpO2 90%   BMI 32.32 kg/m , BMI Body mass index is 32.32 kg/m.  Wt Readings from Last 3 Encounters:  03/19/16 235 lb (106.6 kg)  02/20/16 237 lb (107.5 kg)  02/07/16 230 lb (104.3 kg)    General: Overweight elderly male, appears mildly short of breath. HEENT: Conjunctiva and lids normal, oropharynx clear with poor dentition. Neck: Supple, no elevated JVP or carotid bruits, no thyromegaly. Lungs: Crackles noted on the left side, nonlabored breathing at rest. Cardiac: Irregularly irregular, no S3, soft systolic murmur, no pericardial rub. Abdomen: Obese,  nontender, bowel sounds present. Extremities: Chrronic appearing edema and venous stasis changes, distal pulses 1-2+. Skin: Warm and dry. Musculoskeletal: No kyphosis. Neuropsychiatric: Alert and oriented x3, affect grossly appropriate.  ECG: I personally reviewed the tracing from 02/10/2016 which showed atrial fibrillation with intermittent PVCs versus aberrantly conducted complexes. Nonspecific T-wave changes.  Recent Labwork: 01/03/2016: BUN 33; Creatinine, Ser 1.53; Hemoglobin 15.4; Platelets 173; Potassium 4.0; Sodium 140   Other Studies Reviewed Today:  Lexiscan Myoview 02/07/2016:  The study is normal. No myocardial ischemia or scar.  This is a low risk study.  Nuclear stress EF: 54%.  Nonspecific ST-T abnormalities in inferolateral leads.  Echocardiogram 02/18/2016: Study Conclusions  - Left ventricle: The cavity size was normal. Wall thickness was   increased in a pattern of moderate to severe LVH. Systolic   function was normal. The estimated ejection fraction was in the   range of 60% to 65%. Wall motion was normal; there were no   regional wall motion abnormalities. The study was not technically   sufficient to allow evaluation of LV diastolic dysfunction due to   atrial fibrillation. - Aortic valve: Mildly calcified annulus. Trileaflet; mildly   thickened leaflets. Mean gradient (S): 4 mm Hg.  Valve area (VTI):   2.1 cm^2. Valve area (Vmax): 1.96 cm^2. - Mitral valve: Mildly calcified annulus. Mildly thickened leaflets   . There was mild regurgitation. - Left atrium: The atrium was mildly dilated. - Right atrium: The atrium was mildly dilated. - Technically difficult study. Echocontrast was used to enhance   visualization.  Assessment and Plan:  1. Persistent atrial fibrillation with CHADSVASC score of 5. Suspect that at least some of her shortness of breath is related, and we have discussed elective cardioversion previously. Ischemic workup was low risk and his LVEF is normal. He has been on Eliquis for the last few months and continues on Toprol-XL. Plan is admission to the hospital for treatment of suspected volume overload and potential acute on chronic diastolic heart failure, rule out bronchitis with chest x-ray pending. We will get him on the schedule for a cardioversion tomorrow afternoon with IV diuresis today.  2. Possible acute on chronic diastolic heart failure. I spoke with Dr. Cindie Laroche and ultimately Dr. Wynetta Emery on the hospitalist team regarding admission. No telemetry bed available for direct admission so he will go through the ER and can at least get started on IV Lasix. Would start Lasix 40 m IV twice daily. Also needs a PA and lateral chest x-ray.  3. Multivessel CAD status post prior PCI's and CABG. Recent Lexiscan Myoview was low risk.  4. History of prior DVT and postphlebitic syndrome.  5. Hyperlipidemia, on Crestor.  Current medicines were reviewed with the patient today.  Disposition: Patient being taken from office to ER to initiation hospitalization.  Signed, Satira Sark, MD, [facility] 03/19/2016 10:01 AM    [facility] [address], [place], [place] [address] Phone: [phone]; Fax: [phone]

## 2016-03-19 ENCOUNTER — Inpatient Hospital Stay (HOSPITAL_COMMUNITY)
Admission: AD | Admit: 2016-03-19 | Discharge: 2016-03-21 | DRG: 291 | Disposition: A | Discharge status: Home / Self Care | Payer: Medicare Other | Source: Ambulatory Visit | Attending: Family Medicine | Admitting: Family Medicine

## 2016-03-19 ENCOUNTER — Ambulatory Visit (INDEPENDENT_AMBULATORY_CARE_PROVIDER_SITE_OTHER): Payer: Medicare Other | Admitting: Cardiology

## 2016-03-19 ENCOUNTER — Inpatient Hospital Stay (HOSPITAL_COMMUNITY): Payer: Medicare Other

## 2016-03-19 ENCOUNTER — Encounter: Payer: Self-pay | Admitting: Cardiology

## 2016-03-19 ENCOUNTER — Encounter (HOSPITAL_COMMUNITY): Payer: Self-pay | Admitting: Family Medicine

## 2016-03-19 ENCOUNTER — Emergency Department (HOSPITAL_COMMUNITY)
Admission: EM | Admit: 2016-03-19 | Discharge: 2016-03-19 | Discharge status: Absent without leave | Payer: Medicare Other

## 2016-03-19 VITALS — BP 120/58 | HR 92 | Ht 71.5 in | Wt 235.0 lb

## 2016-03-19 DIAGNOSIS — I454 Nonspecific intraventricular block: Secondary | ICD-10-CM | POA: Diagnosis present

## 2016-03-19 DIAGNOSIS — R600 Localized edema: Secondary | ICD-10-CM

## 2016-03-19 DIAGNOSIS — Z87891 Personal history of nicotine dependence: Secondary | ICD-10-CM | POA: Diagnosis not present

## 2016-03-19 DIAGNOSIS — Z791 Long term (current) use of non-steroidal anti-inflammatories (NSAID): Secondary | ICD-10-CM

## 2016-03-19 DIAGNOSIS — R1310 Dysphagia, unspecified: Secondary | ICD-10-CM | POA: Diagnosis present

## 2016-03-19 DIAGNOSIS — I482 Chronic atrial fibrillation: Secondary | ICD-10-CM | POA: Diagnosis present

## 2016-03-19 DIAGNOSIS — I481 Persistent atrial fibrillation: Secondary | ICD-10-CM | POA: Diagnosis present

## 2016-03-19 DIAGNOSIS — I25708 Atherosclerosis of coronary artery bypass graft(s), unspecified, with other forms of angina pectoris: Secondary | ICD-10-CM | POA: Diagnosis not present

## 2016-03-19 DIAGNOSIS — I110 Hypertensive heart disease with heart failure: Principal | ICD-10-CM | POA: Diagnosis present

## 2016-03-19 DIAGNOSIS — E785 Hyperlipidemia, unspecified: Secondary | ICD-10-CM | POA: Diagnosis present

## 2016-03-19 DIAGNOSIS — N400 Enlarged prostate without lower urinary tract symptoms: Secondary | ICD-10-CM | POA: Diagnosis present

## 2016-03-19 DIAGNOSIS — Z800 Family history of malignant neoplasm of digestive organs: Secondary | ICD-10-CM | POA: Diagnosis not present

## 2016-03-19 DIAGNOSIS — J181 Lobar pneumonia, unspecified organism: Secondary | ICD-10-CM | POA: Diagnosis not present

## 2016-03-19 DIAGNOSIS — H9190 Unspecified hearing loss, unspecified ear: Secondary | ICD-10-CM | POA: Diagnosis present

## 2016-03-19 DIAGNOSIS — I5032 Chronic diastolic (congestive) heart failure: Secondary | ICD-10-CM | POA: Diagnosis present

## 2016-03-19 DIAGNOSIS — I25119 Atherosclerotic heart disease of native coronary artery with unspecified angina pectoris: Secondary | ICD-10-CM | POA: Diagnosis not present

## 2016-03-19 DIAGNOSIS — J189 Pneumonia, unspecified organism: Secondary | ICD-10-CM | POA: Diagnosis present

## 2016-03-19 DIAGNOSIS — I5033 Acute on chronic diastolic (congestive) heart failure: Secondary | ICD-10-CM | POA: Diagnosis present

## 2016-03-19 DIAGNOSIS — R0600 Dyspnea, unspecified: Secondary | ICD-10-CM | POA: Diagnosis present

## 2016-03-19 DIAGNOSIS — J9811 Atelectasis: Secondary | ICD-10-CM | POA: Diagnosis present

## 2016-03-19 DIAGNOSIS — E119 Type 2 diabetes mellitus without complications: Secondary | ICD-10-CM | POA: Diagnosis present

## 2016-03-19 DIAGNOSIS — Z79899 Other long term (current) drug therapy: Secondary | ICD-10-CM | POA: Diagnosis not present

## 2016-03-19 DIAGNOSIS — K219 Gastro-esophageal reflux disease without esophagitis: Secondary | ICD-10-CM | POA: Diagnosis present

## 2016-03-19 DIAGNOSIS — Z86010 Personal history of colonic polyps: Secondary | ICD-10-CM | POA: Diagnosis not present

## 2016-03-19 DIAGNOSIS — Z9842 Cataract extraction status, left eye: Secondary | ICD-10-CM | POA: Diagnosis not present

## 2016-03-19 DIAGNOSIS — R0989 Other specified symptoms and signs involving the circulatory and respiratory systems: Secondary | ICD-10-CM | POA: Diagnosis not present

## 2016-03-19 DIAGNOSIS — G8929 Other chronic pain: Secondary | ICD-10-CM | POA: Diagnosis present

## 2016-03-19 DIAGNOSIS — I5031 Acute diastolic (congestive) heart failure: Secondary | ICD-10-CM

## 2016-03-19 DIAGNOSIS — Z9861 Coronary angioplasty status: Secondary | ICD-10-CM

## 2016-03-19 DIAGNOSIS — E782 Mixed hyperlipidemia: Secondary | ICD-10-CM

## 2016-03-19 DIAGNOSIS — Z87442 Personal history of urinary calculi: Secondary | ICD-10-CM

## 2016-03-19 DIAGNOSIS — Z86718 Personal history of other venous thrombosis and embolism: Secondary | ICD-10-CM | POA: Diagnosis not present

## 2016-03-19 DIAGNOSIS — G4730 Sleep apnea, unspecified: Secondary | ICD-10-CM | POA: Diagnosis present

## 2016-03-19 DIAGNOSIS — I87009 Postthrombotic syndrome without complications of unspecified extremity: Secondary | ICD-10-CM | POA: Diagnosis not present

## 2016-03-19 DIAGNOSIS — Z951 Presence of aortocoronary bypass graft: Secondary | ICD-10-CM

## 2016-03-19 DIAGNOSIS — Z961 Presence of intraocular lens: Secondary | ICD-10-CM | POA: Diagnosis present

## 2016-03-19 DIAGNOSIS — I2510 Atherosclerotic heart disease of native coronary artery without angina pectoris: Secondary | ICD-10-CM | POA: Diagnosis present

## 2016-03-19 DIAGNOSIS — Z794 Long term (current) use of insulin: Secondary | ICD-10-CM

## 2016-03-19 DIAGNOSIS — E0800 Diabetes mellitus due to underlying condition with hyperosmolarity without nonketotic hyperglycemic-hyperosmolar coma (NKHHC): Secondary | ICD-10-CM

## 2016-03-19 DIAGNOSIS — E7800 Pure hypercholesterolemia, unspecified: Secondary | ICD-10-CM | POA: Diagnosis not present

## 2016-03-19 DIAGNOSIS — Z7901 Long term (current) use of anticoagulants: Secondary | ICD-10-CM | POA: Diagnosis not present

## 2016-03-19 DIAGNOSIS — M549 Dorsalgia, unspecified: Secondary | ICD-10-CM | POA: Diagnosis present

## 2016-03-19 DIAGNOSIS — M5440 Lumbago with sciatica, unspecified side: Secondary | ICD-10-CM

## 2016-03-19 DIAGNOSIS — I10 Essential (primary) hypertension: Secondary | ICD-10-CM

## 2016-03-19 DIAGNOSIS — I4819 Other persistent atrial fibrillation: Secondary | ICD-10-CM

## 2016-03-19 DIAGNOSIS — I493 Ventricular premature depolarization: Secondary | ICD-10-CM | POA: Diagnosis not present

## 2016-03-19 DIAGNOSIS — R0601 Orthopnea: Secondary | ICD-10-CM

## 2016-03-19 LAB — COMPREHENSIVE METABOLIC PANEL
ALBUMIN: 3.3 g/dL — AB (ref 3.5–5.0)
ALT: 19 U/L (ref 17–63)
AST: 22 U/L (ref 15–41)
Alkaline Phosphatase: 105 U/L (ref 38–126)
Anion gap: 11 (ref 5–15)
BILIRUBIN TOTAL: 0.7 mg/dL (ref 0.3–1.2)
BUN: 17 mg/dL (ref 6–20)
CHLORIDE: 102 mmol/L (ref 101–111)
CO2: 27 mmol/L (ref 22–32)
Calcium: 9.6 mg/dL (ref 8.9–10.3)
Creatinine, Ser: 1.57 mg/dL — ABNORMAL HIGH (ref 0.61–1.24)
GFR calc Af Amer: 46 mL/min — ABNORMAL LOW (ref >60)
GFR calc non Af Amer: 39 mL/min — ABNORMAL LOW (ref >60)
GLUCOSE: 142 mg/dL — AB (ref 65–99)
POTASSIUM: 3.9 mmol/L (ref 3.5–5.1)
SODIUM: 140 mmol/L (ref 135–145)
TOTAL PROTEIN: 6.9 g/dL (ref 6.5–8.1)

## 2016-03-19 LAB — GLUCOSE, CAPILLARY
GLUCOSE-CAPILLARY: 188 mg/dL — AB (ref 65–99)
Glucose-Capillary: 127 mg/dL — ABNORMAL HIGH (ref 65–99)
Glucose-Capillary: 255 mg/dL — ABNORMAL HIGH (ref 65–99)

## 2016-03-19 LAB — TSH: TSH: 1.288 u[IU]/mL (ref 0.350–4.500)

## 2016-03-19 LAB — LIPID PANEL
CHOLESTEROL: 154 mg/dL (ref 0–200)
HDL: 32 mg/dL — AB (ref >40)
LDL Cholesterol: 95 mg/dL (ref 0–99)
TRIGLYCERIDES: 136 mg/dL (ref <150)
Total CHOL/HDL Ratio: 4.8 RATIO
VLDL: 27 mg/dL (ref 0–40)

## 2016-03-19 LAB — BRAIN NATRIURETIC PEPTIDE: B Natriuretic Peptide: 156 pg/mL — ABNORMAL HIGH (ref 0.0–100.0)

## 2016-03-19 LAB — CBC
HCT: 40.8 % (ref 39.0–52.0)
Hemoglobin: 13.2 g/dL (ref 13.0–17.0)
MCH: 28 pg (ref 26.0–34.0)
MCHC: 32.4 g/dL (ref 30.0–36.0)
MCV: 86.6 fL (ref 78.0–100.0)
PLATELETS: 258 10*3/uL (ref 150–400)
RBC: 4.71 MIL/uL (ref 4.22–5.81)
RDW: 13.4 % (ref 11.5–15.5)
WBC: 12.6 10*3/uL — AB (ref 4.0–10.5)

## 2016-03-19 LAB — TROPONIN I

## 2016-03-19 MED ORDER — AMLODIPINE BESYLATE 5 MG PO TABS
5.0000 mg | ORAL_TABLET | Freq: Every day | ORAL | Status: DC
  Administered 2016-03-20 – 2016-03-21 (×2): 5 mg via ORAL
  Filled 2016-03-19 (×2): qty 1

## 2016-03-19 MED ORDER — BUDESONIDE 0.25 MG/2ML IN SUSP
0.2500 mg | Freq: Two times a day (BID) | RESPIRATORY_TRACT | Status: DC
  Filled 2016-03-19: qty 2

## 2016-03-19 MED ORDER — PANTOPRAZOLE SODIUM 40 MG PO TBEC
40.0000 mg | DELAYED_RELEASE_TABLET | Freq: Every day | ORAL | Status: DC
  Administered 2016-03-20 – 2016-03-21 (×2): 40 mg via ORAL
  Filled 2016-03-19 (×2): qty 1

## 2016-03-19 MED ORDER — ROSUVASTATIN CALCIUM 10 MG PO TABS
10.0000 mg | ORAL_TABLET | Freq: Every day | ORAL | Status: DC
  Administered 2016-03-20 – 2016-03-21 (×2): 10 mg via ORAL
  Filled 2016-03-19 (×2): qty 1

## 2016-03-19 MED ORDER — HYDROCORTISONE 1 % EX OINT
TOPICAL_OINTMENT | Freq: Two times a day (BID) | CUTANEOUS | Status: DC
  Administered 2016-03-19: 23:00:00 via TOPICAL
  Administered 2016-03-20 (×2): 1 via TOPICAL
  Filled 2016-03-19: qty 28.35

## 2016-03-19 MED ORDER — SODIUM CHLORIDE 0.9% FLUSH: 3.0000 mL | INTRAVENOUS | Status: DC | PRN

## 2016-03-19 MED ORDER — DOXYCYCLINE HYCLATE 100 MG PO TABS
100.0000 mg | ORAL_TABLET | Freq: Two times a day (BID) | ORAL | Status: DC
  Administered 2016-03-19: 100 mg via ORAL
  Filled 2016-03-19: qty 1

## 2016-03-19 MED ORDER — ONDANSETRON HCL 4 MG/2ML IJ SOLN: 4.0000 mg | Freq: Four times a day (QID) | INTRAMUSCULAR | Status: DC | PRN

## 2016-03-19 MED ORDER — FUROSEMIDE 10 MG/ML IJ SOLN
40.0000 mg | Freq: Two times a day (BID) | INTRAMUSCULAR | Status: DC
  Administered 2016-03-19 – 2016-03-21 (×4): 40 mg via INTRAVENOUS
  Filled 2016-03-19 (×4): qty 4

## 2016-03-19 MED ORDER — HYDRALAZINE HCL 20 MG/ML IJ SOLN: 10.0000 mg | INTRAMUSCULAR | Status: DC | PRN

## 2016-03-19 MED ORDER — APIXABAN 5 MG PO TABS
5.0000 mg | ORAL_TABLET | Freq: Two times a day (BID) | ORAL | Status: DC
  Administered 2016-03-19: 5 mg via ORAL
  Filled 2016-03-19: qty 1

## 2016-03-19 MED ORDER — MOMETASONE FURO-FORMOTEROL FUM 200-5 MCG/ACT IN AERO
2.0000 | INHALATION_SPRAY | Freq: Two times a day (BID) | RESPIRATORY_TRACT | Status: DC
  Administered 2016-03-19 – 2016-03-21 (×4): 2 via RESPIRATORY_TRACT
  Filled 2016-03-19: qty 8.8

## 2016-03-19 MED ORDER — METHYLPREDNISOLONE SODIUM SUCC 125 MG IJ SOLR
80.0000 mg | Freq: Once | INTRAMUSCULAR | Status: AC
  Administered 2016-03-19: 80 mg via INTRAVENOUS
  Filled 2016-03-19: qty 2

## 2016-03-19 MED ORDER — IPRATROPIUM-ALBUTEROL 0.5-2.5 (3) MG/3ML IN SOLN
3.0000 mL | Freq: Four times a day (QID) | RESPIRATORY_TRACT | Status: DC | PRN
  Filled 2016-03-19: qty 3

## 2016-03-19 MED ORDER — SODIUM CHLORIDE 0.9% FLUSH
3.0000 mL | Freq: Two times a day (BID) | INTRAVENOUS | Status: DC
  Administered 2016-03-20 (×2): 3 mL via INTRAVENOUS

## 2016-03-19 MED ORDER — TAMSULOSIN HCL 0.4 MG PO CAPS
0.4000 mg | ORAL_CAPSULE | Freq: Every day | ORAL | Status: DC
  Administered 2016-03-19 – 2016-03-20 (×2): 0.4 mg via ORAL
  Filled 2016-03-19 (×2): qty 1

## 2016-03-19 MED ORDER — INSULIN GLARGINE 100 UNIT/ML ~~LOC~~ SOLN
24.0000 [IU] | Freq: Every day | SUBCUTANEOUS | Status: DC
  Administered 2016-03-19 – 2016-03-20 (×2): 24 [IU] via SUBCUTANEOUS
  Filled 2016-03-19 (×4): qty 0.24

## 2016-03-19 MED ORDER — PREGABALIN 50 MG PO CAPS
50.0000 mg | ORAL_CAPSULE | Freq: Every day | ORAL | Status: DC
  Administered 2016-03-20 – 2016-03-21 (×2): 50 mg via ORAL
  Filled 2016-03-19 (×2): qty 1

## 2016-03-19 MED ORDER — INSULIN ASPART 100 UNIT/ML ~~LOC~~ SOLN
0.0000 [IU] | Freq: Three times a day (TID) | SUBCUTANEOUS | Status: DC
  Administered 2016-03-19: 3 [IU] via SUBCUTANEOUS
  Administered 2016-03-20: 11 [IU] via SUBCUTANEOUS
  Administered 2016-03-20 – 2016-03-21 (×2): 5 [IU] via SUBCUTANEOUS

## 2016-03-19 MED ORDER — LEVOFLOXACIN IN D5W 500 MG/100ML IV SOLN
500.0000 mg | INTRAVENOUS | Status: DC
  Administered 2016-03-19: 500 mg via INTRAVENOUS
  Filled 2016-03-19: qty 100

## 2016-03-19 MED ORDER — AMLODIPINE-OLMESARTAN 5-40 MG PO TABS: 1.0000 | ORAL_TABLET | Freq: Every day | ORAL | Status: DC

## 2016-03-19 MED ORDER — CLONIDINE HCL 0.1 MG PO TABS
0.1000 mg | ORAL_TABLET | Freq: Two times a day (BID) | ORAL | Status: DC
  Administered 2016-03-19 – 2016-03-21 (×4): 0.1 mg via ORAL
  Filled 2016-03-19 (×4): qty 1

## 2016-03-19 MED ORDER — SODIUM CHLORIDE 0.9 % IV SOLN
250.0000 mL | INTRAVENOUS | Status: DC
  Administered 2016-03-19: 250 mL via INTRAVENOUS

## 2016-03-19 MED ORDER — IPRATROPIUM-ALBUTEROL 0.5-2.5 (3) MG/3ML IN SOLN
3.0000 mL | Freq: Four times a day (QID) | RESPIRATORY_TRACT | Status: DC
  Administered 2016-03-19 – 2016-03-21 (×6): 3 mL via RESPIRATORY_TRACT
  Filled 2016-03-19 (×6): qty 3

## 2016-03-19 MED ORDER — SODIUM CHLORIDE 0.9 % IV SOLN: 250.0000 mL | INTRAVENOUS | Status: DC | PRN

## 2016-03-19 MED ORDER — SODIUM CHLORIDE 0.9% FLUSH
3.0000 mL | Freq: Two times a day (BID) | INTRAVENOUS | Status: DC
  Administered 2016-03-20: 3 mL via INTRAVENOUS

## 2016-03-19 MED ORDER — IRBESARTAN 300 MG PO TABS
300.0000 mg | ORAL_TABLET | Freq: Every day | ORAL | Status: DC
  Administered 2016-03-20 – 2016-03-21 (×2): 300 mg via ORAL
  Filled 2016-03-19 (×2): qty 1

## 2016-03-19 MED ORDER — CHOLECALCIFEROL 10 MCG (400 UNIT) PO TABS
400.0000 [IU] | ORAL_TABLET | Freq: Every day | ORAL | Status: DC
  Administered 2016-03-20 – 2016-03-21 (×2): 400 [IU] via ORAL
  Filled 2016-03-19 (×2): qty 1

## 2016-03-19 MED ORDER — METOPROLOL SUCCINATE ER 50 MG PO TB24
50.0000 mg | ORAL_TABLET | Freq: Every day | ORAL | Status: DC
  Administered 2016-03-20 – 2016-03-21 (×2): 50 mg via ORAL
  Filled 2016-03-19 (×2): qty 1

## 2016-03-19 MED ORDER — POTASSIUM CHLORIDE CRYS ER 20 MEQ PO TBCR
30.0000 meq | EXTENDED_RELEASE_TABLET | Freq: Two times a day (BID) | ORAL | Status: DC
  Administered 2016-03-20 – 2016-03-21 (×3): 30 meq via ORAL
  Filled 2016-03-19 (×3): qty 1

## 2016-03-19 MED ORDER — INSULIN ASPART 100 UNIT/ML ~~LOC~~ SOLN
8.0000 [IU] | Freq: Three times a day (TID) | SUBCUTANEOUS | Status: DC
  Administered 2016-03-20 – 2016-03-21 (×2): 8 [IU] via SUBCUTANEOUS

## 2016-03-19 MED ORDER — NITROGLYCERIN 0.4 MG SL SUBL: 0.4000 mg | SUBLINGUAL_TABLET | SUBLINGUAL | Status: DC | PRN

## 2016-03-19 MED ORDER — DM-GUAIFENESIN ER 30-600 MG PO TB12
1.0000 | ORAL_TABLET | Freq: Two times a day (BID) | ORAL | Status: DC
  Administered 2016-03-19 – 2016-03-21 (×4): 1 via ORAL
  Filled 2016-03-19 (×4): qty 1

## 2016-03-19 MED ORDER — ACETAMINOPHEN 325 MG PO TABS
650.0000 mg | ORAL_TABLET | ORAL | Status: DC | PRN
  Administered 2016-03-19: 650 mg via ORAL
  Filled 2016-03-19: qty 2

## 2016-03-19 MED ORDER — TIMOLOL MALEATE 0.5 % OP SOLN
1.0000 [drp] | Freq: Every day | OPHTHALMIC | Status: DC
  Administered 2016-03-20 – 2016-03-21 (×2): 1 [drp] via OPHTHALMIC
  Filled 2016-03-19: qty 5

## 2016-03-19 MED ORDER — IPRATROPIUM-ALBUTEROL 0.5-2.5 (3) MG/3ML IN SOLN
3.0000 mL | Freq: Once | RESPIRATORY_TRACT | Status: AC
  Administered 2016-03-19: 3 mL via RESPIRATORY_TRACT

## 2016-03-19 NOTE — Progress Notes (Signed)
03/19/2016 4:49 PM  Called to see patient due to acute shortness of breath. Pt seen with shallow breathing, sitting up in bed, appears distressed, lung exam reveals that patient has diffuse exp wheezes at base and poor air movement.  I ordered stat Duoneb treatment x 1.  RN had already applied oxygen [place].  Pt was given neb Tx by RN and reported that he felt better. Will order solumedrol 80 mg IV x 1 dose now.  Add humidification to oxygen.  Encouraged patient to lower the temp in room.  He has it really warm in the room.  He says he can't tolerate cool temps at all.   Pt says that he felt much better after the breathing treatment.  Will continue to monitor.  Pt already received his antibiotic.    Murvin Natal, MD

## 2016-03-19 NOTE — Progress Notes (Signed)
Patient refused CPAP. Placed patient on [facility]. Tolerating well. RT will continue to monitor.

## 2016-03-19 NOTE — Patient Instructions (Signed)
We are admitting you to the hospital     They will schedule your follow after leaving hospital    You will go the the ED to await and bed and start IV therapy      Thank you for choosing [facility] !

## 2016-03-19 NOTE — H&P (Signed)
History and Physical  [name] YHC:[id number] DOB: [birth year] DOA: 03/19/2016  Referring physician: Domenic Polite, cardiologist PCP: Maricela Curet, MD   Chief Complaint: worsening SOB  HPI: [name] is a 83 y.o. male with chronic atrial fibrillation CHADVASC 5 anticoagulated on eliquis, CAD s/p CABG, diastolic CHF, DM, HTN and other history detailed below presented to cardiology office today complaining of worsening SOB over past 3 months.  He had been out of his lasix for a while and recently restarted on lasix by his PCP.  He says he has had increasing abdominal distension, cough, SOB and generalized weakness.  He can barely walk 20 feet without severe SOB.  He is being evaluated by cardiology for possible cardioversion from his chronic atrial fibrillation.  He was noted to be volume overloaded in office today and admission was requested for IV diuresis.  He denies chest pain.  He does have significant SOB.  His family notes that they have been concerned about his recent decline.  His cough has been mostly nonproductive.  No known sick contact.  No fever or chills reported.    Review of Systems: All systems reviewed and apart from history of presenting illness, are negative.  Past Medical History:  Diagnosis Date  . BPH (benign prostatic hyperplasia)   . CAD (coronary artery disease)    Multivessel status post CABG 2011 - LIMA to LAD, SVG to diagonal, SVG to OM, SVG to PDA  . Cellulitis    12/15  . Chronic back pain   . Essential hypertension   . Gastric mass    EGD 9/15  . GERD (gastroesophageal reflux disease)   . History of DVT (deep vein thrombosis)    Postphlebitic syndrome  . History of kidney stones   . HOH (hard of hearing)   . Hx of CABG   . Hyperlipidemia   . Sleep apnea    Stop Bang score of 5  . Type 2 diabetes mellitus ([place])    Past Surgical History:  Procedure Laterality Date  . CATARACT EXTRACTION W/PHACO Left 02/07/2016   Procedure: CATARACT  EXTRACTION PHACO AND INTRAOCULAR LENS PLACEMENT (IOC);  Surgeon: [name], MD;  Location: [facility];  Service: Ophthalmology;  Laterality: Left;  CDE:  23.13  . COLONOSCOPY  2004   Dr. Laural Golden: three small polyps at cecum, path unknown, external hemorrhoids  . COLONOSCOPY N/A 08/16/2013   Dr. [name]: incomplete prep. multiple tubular adenomas, multiple biopsies. Needs surveillance in Aug 2016 due to poor prep  . CORONARY ARTERY BYPASS GRAFT     x5  . CORONARY ARTERY BYPASS GRAFT  2011  . CYSTOSCOPY N/A 12/12/2012   Procedure: CYSTOSCOPY FLEXIBLE;  Surgeon: Marissa Nestle, MD;  Location: [facility];  Service: Urology;  Laterality: N/A;  . ESOPHAGOGASTRODUODENOSCOPY N/A 08/16/2013   Dr. [name]: normal esophagus s/p Maloney dilation, gastric erosions, submucosal gastric mass vs extrinsic mass  . EUS N/A 09/07/2013   Dr. 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 INCISION AND DRAINAGE ABSCESS N/A 12/20/2013   Procedure: INCISION AND DRAINAGE ABSCESS NECK;  Surgeon: Jamesetta So, MD;  Location: [facility];  Service: General;  Laterality: N/A;  . Venia Minks DILATION N/A 08/16/2013   Procedure: Keturah Shavers;  Surgeon: Daneil Dolin, MD;  Location: [facility];  Service: Endoscopy;  Laterality: N/A;   Social History:  reports that he quit smoking about 60 years ago. He has a 11.00 pack-year smoking history. He has never  used smokeless tobacco. He reports that he does not drink alcohol or use drugs.   No Known Allergies  Family History  Problem Relation Age of Onset  . Colon cancer Son 63    deceased    Prior to Admission medications   Medication Sig Start Date End Date Taking? Authorizing Provider  amLODipine-olmesartan (AZOR) 5-40 MG per tablet Take 1 tablet by mouth daily.    Historical Provider, MD  apixaban (ELIQUIS) 5 MG TABS tablet Take 5 mg by mouth 2 (two) times daily.    Historical Provider, MD  beclomethasone (QVAR) 80 MCG/ACT inhaler Inhale 2 puffs into the lungs 2  (two) times daily.    Historical Provider, MD  Besifloxacin HCl (BESIVANCE) 0.6 % SUSP Place 1 drop into the left eye 2 (two) times daily.     Historical Provider, MD  Cholecalciferol (VITAMIN D3) 400 units tablet Take 400 Units by mouth daily.    Historical Provider, MD  cloNIDine (CATAPRES) 0.1 MG tablet Take 0.1 mg by mouth 2 (two) times daily.    Historical Provider, MD  dextromethorphan-guaiFENesin (MUCINEX DM) 30-600 MG 12hr tablet Take 1 tablet by mouth 2 (two) times daily.    Historical Provider, MD  furosemide (LASIX) 20 MG tablet Take 20 mg by mouth 2 (two) times daily.    Historical Provider, MD  insulin aspart (NOVOLOG FLEXPEN) 100 UNIT/ML FlexPen Inject 20 Units into the skin 2 (two) times daily with a meal.    Historical Provider, MD  Insulin Glargine (LANTUS SOLOSTAR) 100 UNIT/ML Solostar Pen Inject 45 Units into the skin daily at 6 PM.     Historical Provider, MD  metoprolol succinate (TOPROL-XL) 50 MG 24 hr tablet Take 50 mg by mouth daily. Take with or immediately following a meal.    Historical Provider, MD  nepafenac (ILEVRO) 0.3 % ophthalmic suspension Place 1 drop into the left eye daily.    Historical Provider, MD  nitroGLYCERIN (NITROSTAT) 0.4 MG SL tablet Place 1 tablet (0.4 mg total) under the tongue every 5 (five) minutes as needed for chest pain. 12/22/13   [name], MD  Omega-3 Fatty Acids (OMEGA 3 PO) Take 1 capsule by mouth daily.    Historical Provider, MD  oxymetazoline (AFRIN) 0.05 % nasal spray Place 1 spray into both nostrils 2 (two) times daily.    Historical Provider, MD  pantoprazole (PROTONIX) 40 MG tablet Take 40 mg by mouth daily.    Historical Provider, MD  pregabalin (LYRICA) 50 MG capsule Take 50 mg by mouth daily.    Historical Provider, MD  rosuvastatin (CRESTOR) 10 MG tablet Take 10 mg by mouth daily.    Historical Provider, MD  silodosin (RAPAFLO) 8 MG CAPS capsule Take 8 mg by mouth daily with breakfast.    Historical Provider, MD  timolol  (BETIMOL) 0.5 % ophthalmic solution Place 1 drop into the left eye daily.    Historical Provider, MD   Physical Exam: There were no vitals filed for this visit.   General exam: Moderately built and nourished patient, lying comfortably supine on the gurney in no obvious distress.  Head, eyes and ENT: Nontraumatic and normocephalic. Pupils equally reacting to light and accommodation. Oral mucosa moist.  Neck: Supple. No JVD, carotid bruit or thyromegaly.  Lymphatics: No lymphadenopathy.  Respiratory system: Clear to auscultation. No increased work of breathing.  Cardiovascular system: S1 and S2 heard, RRR. No JVD, murmurs, gallops, clicks or pedal edema.  Gastrointestinal system: Abdomen is nondistended, soft and nontender. Normal  bowel sounds heard. No organomegaly or masses appreciated.  Central nervous system: Alert and oriented. No focal neurological deficits.  Extremities: Symmetric 5 x 5 power. Peripheral pulses symmetrically felt.   Skin: No rashes or acute findings.  Musculoskeletal system: Negative exam.  Psychiatry: Pleasant and cooperative.   Labs on Admission:  Basic Metabolic Panel: No results for input(s): NA, K, CL, CO2, GLUCOSE, BUN, CREATININE, CALCIUM, MG, PHOS in the last 168 hours. Liver Function Tests: No results for input(s): AST, ALT, ALKPHOS, BILITOT, PROT, ALBUMIN in the last 168 hours. No results for input(s): LIPASE, AMYLASE in the last 168 hours. No results for input(s): AMMONIA in the last 168 hours. CBC: No results for input(s): WBC, NEUTROABS, HGB, HCT, MCV, PLT in the last 168 hours. Cardiac Enzymes: No results for input(s): CKTOTAL, CKMB, CKMBINDEX, TROPONINI in the last 168 hours.  BNP (last 3 results) No results for input(s): PROBNP in the last 8760 hours. CBG:  Recent Labs Lab 03/19/16 1150  GLUCAP 127*    Radiological Exams on Admission: No results found.  Assessment/Plan Active Problems:   Lower extremity edema   Dyspnea    Essential hypertension   History of DVT (deep vein thrombosis)   Diabetes (HCC)   Chronic back pain   Pulmonary vascular congestion   Dysphagia   Acute diastolic CHF (congestive heart failure) ([place])   1. Acute exacerbation of chronic diastolic Heart failure - Pt had missed some scheduled doses of lasix and is now volume overloaded and has increased abdominal distention and edema in the legs and feet.  Admit under Heart failure pathway.  Start diuresis with IV Lasix every 12 hours. Monitor strict intake and output. Daily weights. Monitor on telemetry. Check echocardiogram. Check TSH. Check lipids and cycle troponin. 2. Volume overload-secondary to heart failure exacerbation as noted above-diary seeing as noted. 3. Essential hypertension-resume home medications and provide IV medicines as needed. 4. Chronic atrial fibrillation-rate controlled at this time, continue current medications and anticoagulation, patient is scheduled tentatively for cardioversion with the cardiology team tomorrow. 5. Chronic anticoagulation-continue Eliquis twice a day. 6. Type 2 diabetes mellitus-monitor blood glucose closely and provide insulin during hospitalization for blood glucose treatment. Sliding scale coverage ordered as well for supplemental insulin. 7. GERD-Protonix ordered for GI protection. 8. Sleep Apnea-will offer nightly CPAP. 9. BPH-tamsulosin ordered in the hospital. 10. Generalized weakness-multifactorial secondary to above, PT OT evaluation recommended.   DVT Prophylaxis: eliquis Code Status: full  Family Communication: bedside  Disposition Plan: TBD   Time spent: 74 mins  Irwin Brakeman, MD [facility] Pager [phone]  If 7PM-7AM, please contact night-coverage [email or web address] Password [email or web address] 03/19/2016, 12:00 PM

## 2016-03-19 NOTE — Progress Notes (Signed)
Unable to obtain a peripheral IV.  ICU notified for ultrasound guided IV.

## 2016-03-19 NOTE — Progress Notes (Signed)
   Progress Note  Patient Name: [name] Date of Encounter: 03/19/2016  Reviewed chart and completed DCCV orders for tomorrow (tentatively 2:30 PM with Dr. Harl Bowie). Looks like CXR suggests pneumonia, although afebrile and pleural effusion present so component of CHF also likely. Depending on how he is doing tomorrow would still consider trying to get him back in sinus rhythm as this will likely make management of CHF better. Would reassess in AM.  Signed, Rozann Lesches, MD  03/19/2016, 8:05 PM

## 2016-03-19 NOTE — Progress Notes (Signed)
Patient arrived on unit to room 304.  Dr. Wynetta Emery notified via text page.

## 2016-03-19 NOTE — Progress Notes (Signed)
03/19/2016 2:03 PM  Reviewed CXR results and labs.  It appears that he has pneumonia, starting on antibiotics.  Get blood cultures.   Murvin Natal, MD

## 2016-03-20 ENCOUNTER — Encounter (HOSPITAL_COMMUNITY)
Admission: AD | Disposition: A | Discharge status: Home / Self Care | Payer: Self-pay | Source: Ambulatory Visit | Attending: Family Medicine

## 2016-03-20 ENCOUNTER — Inpatient Hospital Stay (HOSPITAL_COMMUNITY): Payer: Medicare Other

## 2016-03-20 ENCOUNTER — Ambulatory Visit (HOSPITAL_COMMUNITY): Admission: RE | Admit: 2016-03-20 | Payer: Medicare Other | Source: Ambulatory Visit | Admitting: Cardiology

## 2016-03-20 ENCOUNTER — Inpatient Hospital Stay (HOSPITAL_COMMUNITY): Payer: Medicare Other | Admitting: Anesthesiology

## 2016-03-20 ENCOUNTER — Encounter (HOSPITAL_COMMUNITY): Payer: Self-pay | Admitting: Anesthesiology

## 2016-03-20 DIAGNOSIS — R0989 Other specified symptoms and signs involving the circulatory and respiratory systems: Secondary | ICD-10-CM

## 2016-03-20 DIAGNOSIS — I10 Essential (primary) hypertension: Secondary | ICD-10-CM

## 2016-03-20 DIAGNOSIS — E7800 Pure hypercholesterolemia, unspecified: Secondary | ICD-10-CM

## 2016-03-20 DIAGNOSIS — I481 Persistent atrial fibrillation: Secondary | ICD-10-CM

## 2016-03-20 DIAGNOSIS — J181 Lobar pneumonia, unspecified organism: Secondary | ICD-10-CM

## 2016-03-20 DIAGNOSIS — R600 Localized edema: Secondary | ICD-10-CM

## 2016-03-20 DIAGNOSIS — I25708 Atherosclerosis of coronary artery bypass graft(s), unspecified, with other forms of angina pectoris: Secondary | ICD-10-CM

## 2016-03-20 DIAGNOSIS — Z86718 Personal history of other venous thrombosis and embolism: Secondary | ICD-10-CM

## 2016-03-20 DIAGNOSIS — I5033 Acute on chronic diastolic (congestive) heart failure: Secondary | ICD-10-CM

## 2016-03-20 HISTORY — PX: CARDIOVERSION: SHX1299

## 2016-03-20 LAB — CBC WITH DIFFERENTIAL/PLATELET
BASOS PCT: 0 %
Basophils Absolute: 0 10*3/uL (ref 0.0–0.1)
EOS ABS: 0 10*3/uL (ref 0.0–0.7)
Eosinophils Relative: 0 %
HCT: 41.9 % (ref 39.0–52.0)
HEMOGLOBIN: 13.8 g/dL (ref 13.0–17.0)
LYMPHS ABS: 0.3 10*3/uL — AB (ref 0.7–4.0)
Lymphocytes Relative: 3 %
MCH: 28.3 pg (ref 26.0–34.0)
MCHC: 32.9 g/dL (ref 30.0–36.0)
MCV: 86 fL (ref 78.0–100.0)
Monocytes Absolute: 0.1 10*3/uL (ref 0.1–1.0)
Monocytes Relative: 1 %
NEUTROS ABS: 12.7 10*3/uL — AB (ref 1.7–7.7)
NEUTROS PCT: 96 %
Platelets: 254 10*3/uL (ref 150–400)
RBC: 4.87 MIL/uL (ref 4.22–5.81)
RDW: 13.3 % (ref 11.5–15.5)
WBC: 13.2 10*3/uL — AB (ref 4.0–10.5)

## 2016-03-20 LAB — COMPREHENSIVE METABOLIC PANEL WITH GFR
ALT: 20 U/L (ref 17–63)
AST: 17 U/L (ref 15–41)
Albumin: 3.3 g/dL — ABNORMAL LOW (ref 3.5–5.0)
Alkaline Phosphatase: 103 U/L (ref 38–126)
Anion gap: 14 (ref 5–15)
BUN: 21 mg/dL — ABNORMAL HIGH (ref 6–20)
CO2: 26 mmol/L (ref 22–32)
Calcium: 9.6 mg/dL (ref 8.9–10.3)
Chloride: 97 mmol/L — ABNORMAL LOW (ref 101–111)
Creatinine, Ser: 1.61 mg/dL — ABNORMAL HIGH (ref 0.61–1.24)
GFR calc Af Amer: 44 mL/min — ABNORMAL LOW (ref >60)
GFR calc non Af Amer: 38 mL/min — ABNORMAL LOW (ref >60)
Glucose, Bld: 328 mg/dL — ABNORMAL HIGH (ref 65–99)
Potassium: 3.9 mmol/L (ref 3.5–5.1)
Sodium: 137 mmol/L (ref 135–145)
Total Bilirubin: 1.1 mg/dL (ref 0.3–1.2)
Total Protein: 7 g/dL (ref 6.5–8.1)

## 2016-03-20 LAB — GLUCOSE, CAPILLARY
GLUCOSE-CAPILLARY: 201 mg/dL — AB (ref 65–99)
GLUCOSE-CAPILLARY: 123 mg/dL — AB (ref 65–99)
Glucose-Capillary: 321 mg/dL — ABNORMAL HIGH (ref 65–99)
Glucose-Capillary: 247 mg/dL — ABNORMAL HIGH (ref 65–99)
Glucose-Capillary: 230 mg/dL — ABNORMAL HIGH (ref 65–99)

## 2016-03-20 LAB — VITAMIN D 25 HYDROXY (VIT D DEFICIENCY, FRACTURES): VIT D 25 HYDROXY: 33.9 ng/mL (ref 30.0–100.0)

## 2016-03-20 LAB — EXPECTORATED SPUTUM ASSESSMENT W REFEX TO RESP CULTURE

## 2016-03-20 LAB — HEMOGLOBIN A1C
Hgb A1c MFr Bld: 9.6 % — ABNORMAL HIGH (ref 4.8–5.6)
MEAN PLASMA GLUCOSE: 229 mg/dL

## 2016-03-20 LAB — MAGNESIUM: Magnesium: 1.7 mg/dL (ref 1.7–2.4)

## 2016-03-20 LAB — EXPECTORATED SPUTUM ASSESSMENT W GRAM STAIN, RFLX TO RESP C

## 2016-03-20 SURGERY — CARDIOVERSION
Anesthesia: Monitor Anesthesia Care

## 2016-03-20 MED ORDER — SENNOSIDES-DOCUSATE SODIUM 8.6-50 MG PO TABS
1.0000 | ORAL_TABLET | Freq: Two times a day (BID) | ORAL | Status: DC
  Administered 2016-03-20 – 2016-03-21 (×3): 1 via ORAL
  Filled 2016-03-20 (×3): qty 1

## 2016-03-20 MED ORDER — MIDAZOLAM HCL 2 MG/2ML IJ SOLN
INTRAMUSCULAR | Status: AC
  Filled 2016-03-20: qty 2

## 2016-03-20 MED ORDER — BISACODYL 10 MG RE SUPP: 10.0000 mg | Freq: Every day | RECTAL | Status: DC | PRN

## 2016-03-20 MED ORDER — GLYCOPYRROLATE 0.2 MG/ML IJ SOLN
INTRAMUSCULAR | Status: AC
  Filled 2016-03-20: qty 1

## 2016-03-20 MED ORDER — FENTANYL CITRATE (PF) 100 MCG/2ML IJ SOLN
INTRAMUSCULAR | Status: AC
  Filled 2016-03-20: qty 2

## 2016-03-20 MED ORDER — LEVOFLOXACIN IN D5W 500 MG/100ML IV SOLN: 500.0000 mg | INTRAVENOUS | Status: DC

## 2016-03-20 MED ORDER — PROPOFOL 10 MG/ML IV BOLUS
INTRAVENOUS | Status: DC | PRN
  Administered 2016-03-20 (×3): 10.4 mg via INTRAVENOUS

## 2016-03-20 MED ORDER — MIDAZOLAM HCL 2 MG/2ML IJ SOLN
1.0000 mg | INTRAMUSCULAR | Status: AC
  Administered 2016-03-20: 2 mg via INTRAVENOUS

## 2016-03-20 MED ORDER — PROPOFOL 500 MG/50ML IV EMUL
INTRAVENOUS | Status: DC | PRN
  Administered 2016-03-20: 100 ug/kg/min via INTRAVENOUS

## 2016-03-20 MED ORDER — FENTANYL CITRATE (PF) 100 MCG/2ML IJ SOLN
25.0000 ug | INTRAMUSCULAR | Status: AC
  Administered 2016-03-20: 25 ug via INTRAVENOUS

## 2016-03-20 MED ORDER — APIXABAN 2.5 MG PO TABS
2.5000 mg | ORAL_TABLET | Freq: Two times a day (BID) | ORAL | Status: DC
  Administered 2016-03-20 – 2016-03-21 (×3): 2.5 mg via ORAL
  Filled 2016-03-20 (×3): qty 1

## 2016-03-20 MED ORDER — LACTATED RINGERS IV SOLN
INTRAVENOUS | Status: DC
  Administered 2016-03-20: 1000 mL via INTRAVENOUS

## 2016-03-20 MED ORDER — GLYCOPYRROLATE 0.2 MG/ML IJ SOLN
0.2000 mg | Freq: Once | INTRAMUSCULAR | Status: AC
  Administered 2016-03-20: 0.2 mg via INTRAVENOUS

## 2016-03-20 MED ORDER — MAGNESIUM HYDROXIDE 400 MG/5ML PO SUSP
30.0000 mL | Freq: Every day | ORAL | Status: DC | PRN
  Administered 2016-03-20: 30 mL via ORAL
  Filled 2016-03-20: qty 30

## 2016-03-20 NOTE — CV Procedure (Signed)
Procedure: Direct current cardioversion Physician: Dr Carlyle Dolly  Indication: Persistent afib   The patient was brought to the procedure suite after appropirate consent was obtained. Sedation was achieved with the assistance of anesthesiology, for full details please refer to there note. Defib pads were placed in the anterior and posterior positions, and with a synchronized 200j shock the patient was succesfully converted to NSR. Cardiopulmonary monitoring was conducted throughout the procedure, he tolerated the procedure well without complications.   Carlyle Dolly MD

## 2016-03-20 NOTE — Progress Notes (Signed)
Inpatient Diabetes Program Recommendations  AACE/ADA: New Consensus Statement on Inpatient Glycemic Control (2015)  Target Ranges:  Prepandial:   less than 140 mg/dL      Peak postprandial:   less than 180 mg/dL (1-2 hours)      Critically ill patients:  140 - 180 mg/dL   Results for [name], [name] (MRN [id number]) as of 03/20/2016 10:35  Ref. Range 03/19/2016 11:50 03/19/2016 16:40 03/19/2016 21:33 03/20/2016 07:21  Glucose-Capillary Latest Ref Range: 65 - 99 mg/dL 127 (H) 188 (H) 255 (H) 321 (H)   Review of Glycemic Control  Diabetes history: DM2 Outpatient Diabetes medications: Lantus 45 units Q6PM, Humalog correction scale Current orders for Inpatient glycemic control: Lantus 24 units QHS, Novolog 0-15 units TID with meals, Novolog 8 units TID with meals  Inpatient Diabetes Program Recommendations: Insulin - Basal: Please consider increasing Lantus to 30 units QHS. Correction (SSI): Please consider ordering Novolog 0-5 units QHS for bedtime correction scale. Insulin-Meal Coverage: Noted patient received Novolog 8 units for meal coverage this morning. However, patient is NPO. NURSING: Please do not give meal coverage unless patient is eating and eats at least 50% of meals.  NOTE: In reviewing chart, noted patient received one time dose of Solumedrol 80 mg on 03/19/16. Fasting glucose 321 mg/dl this morning.   Thanks, Barnie Alderman, RN, MSN, CDE Diabetes Coordinator Inpatient Diabetes Program [phone] (Team Pager from 8am to 5pm)

## 2016-03-20 NOTE — Anesthesia Postprocedure Evaluation (Signed)
Anesthesia Post Note  Patient: [name]  Procedure(s) Performed: Procedure(s) (LRB): CARDIOVERSION (N/A)  Patient location during evaluation: PACU Anesthesia Type: MAC Level of consciousness: awake and alert Pain management: pain level controlled Vital Signs Assessment: post-procedure vital signs reviewed and stable Respiratory status: spontaneous breathing and patient connected to nasal cannula oxygen Cardiovascular status: stable Anesthetic complications: no     Last Vitals:  Vitals:   03/20/16 1315 03/20/16 1430  BP: 133/85 106/66  Pulse: 92   Resp: 20 18  Temp: 36.9 C 36.4 C    Last Pain:  Vitals:   03/20/16 1430  TempSrc:   PainSc: 0-No pain                 Sarai January

## 2016-03-20 NOTE — Progress Notes (Addendum)
PHARMACY NOTE:  ANTIMICROBIAL RENAL DOSAGE ADJUSTMENT  Current antimicrobial regimen includes a mismatch between antimicrobial dosage and estimated renal function.  As per policy approved by the Pharmacy & Therapeutics and Medical Executive Committees, the antimicrobial dosage will be adjusted accordingly.  Current antimicrobial dosage:  Levaquin 500mg  IV q24hrs, Apixaban 5mg  po BID  Indication: COPDE / stroke prevention  Renal Function: Estimated Creatinine Clearance: 43.3 mL/min (A) (by C-G formula based on SCr of 1.61 mg/dL (H)).    Antimicrobial dosage has been changed to:  Levaquin 500mg  IV q48hrs, Apixaban dose reduced to 2.5mg  po BID due to age > 80 and SCr > 1.5  Additional comments:  Monitor SCR / renal fxn, adjust dosing as appropriate  Thank you for allowing pharmacy to be a part of this patient's care.  Ena Dawley, [facility] 03/20/2016 8:10 AM

## 2016-03-20 NOTE — Progress Notes (Signed)
OT Cancellation Note  Patient Details Name: [name] MRN: [id number] DOB: 1931-03-13   Cancelled Treatment:    Reason Eval/Treat Not Completed: Medical issues which prohibited therapy. Patient with Chronic A-Fib and severe breathing issues as well as being scheduled for a DCCV this afternoon. Patient not appropriate for OT evaluation at this time. Will re-attempt at a later day when appropriate.    Ailene Ravel, OTR/L,CBIS  [phone]  03/20/2016, 9:25 AM

## 2016-03-20 NOTE — Plan of Care (Signed)
Problem: Food- and Nutrition-Related Knowledge Deficit (NB-1.1) Goal: Nutrition education Formal process to instruct or train a patient/client in a skill or to impart knowledge to help patients/clients voluntarily manage or modify food choices and eating behavior to maintain or improve health. Outcome: Adequate for Discharge Nutrition Education Note  RD consulted for nutrition education regarding acute CHF.  RD provided "Low Sodium Nutrition Therapy" handout from the Academy of [employer]. Reviewed patient's dietary recall. He daily consumes high sodium foods  Such as sausage and eggs for breakfast, hot dog or bologna sandwich for lunch and pringle's chips for snack. He also has been using salt shaker.  Provided examples on ways to decrease sodium intake in diet. Discouraged intake of processed foods and use of salt shaker. Encouraged fresh fruits and vegetables as well as whole grain sources of carbohydrates to maximize fiber intake.   RD discussed why it is important for patient to adhere to diet recommendations, and emphasized the role of fluids, foods to avoid, and importance of weighing self daily. Teach back method used.  Expect fair compliance. It may take some time for [name] to adapt to these lifestyle changes since so many of his daily favorite foods are high in sodium. Encouraged him to stay with it and focus more on adding pepper and herbs to season his food and cut his processed foods in half in order to get him started with making long term changes.  Body mass index is 32.84 kg/m. Pt meets criteria for obesity based on current BMI.  Current diet order is Heart Healthy / CHO modified, patient is consuming approximately 100% of meals at this time. Labs and medications reviewed. No further nutrition interventions warranted at this time. RD contact information provided. If additional nutrition issues arise, please re-consult RD.   Colman Cater MS,RD,CSG,LDN Office:  [phone] Pager: [phone]

## 2016-03-20 NOTE — Progress Notes (Signed)
Patient awaiting elective cardioversion for recent onset A. fib on anticoagulation for over 2 months question of left lower lobe atelectasis effusion infiltrate currently on Levaquin hemodynamically stable and less dyspnea after diuresis JAKUB DEBOLD WLS:[id number] DOB: 09/19/1931 DOA: 03/19/2016 PCP: Maricela Curet, MD   Physical Exam: Blood pressure 130/66, pulse 88, temperature 98.2 F (36.8 C), temperature source Oral, resp. rate 20, height 5' 10"  (1.778 m), weight 103.8 kg (228 lb 14.4 oz), SpO2 93 %. Lungs basilar crackles left base prolonged expiratory phase no rales no wheezes appreciable heart irregular rhythm, no S3 auscultated no heaves or thrills or rubs   Investigations:  Recent Results (from the past 240 hour(s))  Culture, blood (Routine X 2) w Reflex to ID Panel     Status: None (Preliminary result)   Collection Time: 03/19/16  3:18 PM  Result Value Ref Range Status   Specimen Description LEFT ANTECUBITAL  Final   Special Requests BOTTLES DRAWN AEROBIC AND ANAEROBIC 4CC EACH  Final   Culture NO GROWTH < 24 HOURS  Final   Report Status PENDING  Incomplete  Culture, blood (Routine X 2) w Reflex to ID Panel     Status: None (Preliminary result)   Collection Time: 03/19/16  3:48 PM  Result Value Ref Range Status   Specimen Description RIGHT ANTECUBITAL  Final   Special Requests BOTTLES DRAWN AEROBIC AND ANAEROBIC 4CC EACH  Final   Culture NO GROWTH < 24 HOURS  Final   Report Status PENDING  Incomplete  Culture, expectorated sputum-assessment     Status: None   Collection Time: 03/20/16 11:00 AM  Result Value Ref Range Status   Specimen Description EXPECTORATED SPUTUM  Final   Special Requests NONE  Final   Sputum evaluation   Final    Sputum specimen not acceptable for testing.  Please recollect.   SPOKE WITH DILDY,V @ 1141 ON 3.16.18 ASKING TO RECOLLECT    Report Status 03/20/2016 FINAL  Final     Basic Metabolic Panel:  Recent Labs  03/19/16 1222  03/20/16 0540  NA 140 137  K 3.9 3.9  CL 102 97*  CO2 27 26  GLUCOSE 142* 328*  BUN 17 21*  CREATININE 1.57* 1.61*  CALCIUM 9.6 9.6  MG  --  1.7   Liver Function Tests:  Recent Labs  03/19/16 1222 03/20/16 0540  AST 22 17  ALT 19 20  ALKPHOS 105 103  BILITOT 0.7 1.1  PROT 6.9 7.0  ALBUMIN 3.3* 3.3*     CBC:  Recent Labs  03/19/16 1222 03/20/16 0540  WBC 12.6* 13.2*  NEUTROABS  --  12.7*  HGB 13.2 13.8  HCT 40.8 41.9  MCV 86.6 86.0  PLT 258 254    Dg Chest 2 View  Result Date: 03/19/2016 CLINICAL DATA:  Cough, shortness of breath, and weakness. Former smoker. History of CABG. EXAM: CHEST  2 VIEW COMPARISON:  Chest x-ray of January 10, 2015 FINDINGS: The right lung is well-expanded. On the left there is patchy increased density at the lung base with small left pleural effusion. This is new. The interstitial markings of both lungs elsewhere are coarse and slightly more conspicuous overall. The cardiac silhouette is enlarged. The pulmonary vascularity is not engorged. There is calcification in the wall of the aortic arch. The sternal wires are intact. The bony thorax exhibits no acute abnormality. IMPRESSION: Left basilar atelectasis or pneumonia with small left pleural effusion. This is superimposed upon findings of COPD and mild chronic CHF. Followup  PA and lateral chest X-ray is recommended in 3-4 weeks following trial of antibiotic therapy to ensure resolution and exclude underlying malignancy. Thoracic aortic atherosclerosis. Electronically Signed   By: David  Martinique M.D.   On: 03/19/2016 13:25      Medications:   Impression:  Principal Problem:   CAP (community acquired pneumonia) Active Problems:   Lower extremity edema   Dyspnea   Essential hypertension   History of DVT (deep vein thrombosis)   Diabetes (HCC)   Chronic back pain   Pulmonary vascular congestion   Dysphagia   Acute diastolic CHF (congestive heart failure) ([place])     Plan: Elective  cardioversions afternoon continue Levaquin monitor be met in a.m.   Consultants: Cardiology   Procedures   Antibiotics: Levaquin      Time spent 30 minutes   LOS: 1 day   Saverio Kader M   03/20/2016, 12:48 PM

## 2016-03-20 NOTE — Transfer of Care (Signed)
Immediate Anesthesia Transfer of Care Note  Patient: [name]  Procedure(s) Performed: Procedure(s): CARDIOVERSION (N/A)  Patient Location: PACU  Anesthesia Type:MAC  Level of Consciousness: awake and alert   Airway & Oxygen Therapy: Patient Spontanous Breathing and Patient connected to nasal cannula oxygen  Post-op Assessment: Report given to RN and Post -op Vital signs reviewed and stable  Post vital signs: Reviewed and stable  Last Vitals:  Vitals:   03/20/16 0635 03/20/16 1315  BP: 130/66 133/85  Pulse: 88 92  Resp: 20 20  Temp: 36.8 C 36.9 C    Last Pain:  Vitals:   03/20/16 1315  TempSrc: Oral  PainSc: 0-No pain         Complications: No apparent anesthesia complications

## 2016-03-20 NOTE — Evaluation (Signed)
Physical Therapy Evaluation Patient Details Name: [name] MRN: [id number] DOB: 01-Mar-1931 Today's Date: 03/20/2016   History of Present Illness  83 y.o.male presents today stating that he has been progressively short of breath with intermittent coughing. He saw Dr. Cindie Laroche and was placed back on Lasix after running out of the medication for several days. Still not feeling any better. He is not reporting any chest pain.  Dx: Persistent atrial fibrillation with CHADSVASC score of 5, Possible acute on chronic diastolic heart failure  Clinical Impression  Pt received sitting up in the chair, and was agreeable to PT evaluation.  Pt is normally independent with unlimited community ambulation, independent with dressing and bathing.  During PT evaluation, he was modified independent with sit<>stand, and was able to ambulate 158ft with no assistive device.  Vitals during PT evaluation were as follows:   At rest BP: 132/68, HR: 90bpm, and SpO2: 96% on RA During ambulation: HR ranged from 90-111bpm After ambulation: BP: 125/71 HR:100bpm, SpO2: 91% on RA.    Pt does not demonstrate need for skilled PT at this time, therefore, will d/c PT.      Follow Up Recommendations No PT follow up    Equipment Recommendations  None recommended by PT    Recommendations for Other Services       Precautions / Restrictions Precautions Precautions: None Restrictions Weight Bearing Restrictions: No      Mobility  Bed Mobility                  Transfers Overall transfer level: Modified independent Equipment used: None                Ambulation/Gait Ambulation/Gait assistance: Modified independent (Device/Increase time) Ambulation Distance (Feet): 110 Feet Assistive device: None Gait Pattern/deviations: Antalgic;WFL(Within Functional Limits)     General Gait Details: HR ranged from 90bpm up to 111bpm during ambulation with PT, and back down to 100bpm once he returned to seated  position for rest at end of tx.    Stairs            Wheelchair Mobility    Modified Rankin (Stroke Patients Only)       Balance Overall balance assessment: Independent                                           Pertinent Vitals/Pain Pain Assessment: No/denies pain    Home Living   Living Arrangements: Non-relatives/Friends (Has a worker and his wife that stay with him. ) Available Help at Discharge: Available 24 hours/day Type of Home: Mobile home Home Access: Stairs to enter Entrance Stairs-Rails: Can reach both Entrance Stairs-Number of Steps: 3 Home Layout: One level Home Equipment: None      Prior Function Level of Independence: Independent   Gait / Transfers Assistance Needed: unlimited community ambulation - uses shopping cart at grocery store.    ADL's / Homemaking Assistance Needed: independent.         Hand Dominance   Dominant Hand: Right    Extremity/Trunk Assessment   Upper Extremity Assessment Upper Extremity Assessment: Overall WFL for tasks assessed    Lower Extremity Assessment Lower Extremity Assessment: Overall WFL for tasks assessed       Communication   Communication: No difficulties  Cognition Arousal/Alertness: Awake/alert Behavior During Therapy: WFL for tasks assessed/performed Overall Cognitive Status: Within Functional Limits for tasks assessed  General Comments      Exercises     Assessment/Plan    PT Assessment Patent does not need any further PT services  PT Problem List         PT Treatment Interventions      PT Goals (Current goals can be found in the Care Plan section)  Acute Rehab PT Goals Patient Stated Goal: To go home PT Goal Formulation: All assessment and education complete, DC therapy    Frequency     Barriers to discharge        Co-evaluation               End of Session Equipment Utilized During Treatment: Gait belt;Other  (comment) (telemetry monitor) Activity Tolerance: Patient tolerated treatment well Patient left: in chair;with call bell/phone within reach;with family/visitor present Nurse Communication: Mobility status (Val, RN notified of pt's mobility status, as well as HR during mobility.  Mobility sheet left hanging in the room. ) PT Visit Diagnosis: Muscle weakness (generalized) (M62.81)    Functional Assessment Tool Used: AM-PAC 6 Clicks Basic Mobility;Clinical judgement Functional Limitation: Mobility: Walking and moving around Mobility: Walking and Moving Around Current Status [id number]): 0 percent impaired, limited or restricted Mobility: Walking and Moving Around Goal Status [phone]): 0 percent impaired, limited or restricted Mobility: Walking and Moving Around Discharge Status [phone]): 0 percent impaired, limited or restricted    Time: 3762-8315 PT Time Calculation (min) (ACUTE ONLY): 24 min   Charges:   PT Evaluation $PT Eval Low Complexity: 1 Procedure PT Treatments $Gait Training: 8-22 mins   PT G Codes:   PT G-Codes **NOT FOR INPATIENT CLASS** Functional Assessment Tool Used: AM-PAC 6 Clicks Basic Mobility;Clinical judgement Functional Limitation: Mobility: Walking and moving around Mobility: Walking and Moving Around Current Status (V7616): 0 percent impaired, limited or restricted Mobility: Walking and Moving Around Goal Status (W7371): 0 percent impaired, limited or restricted Mobility: Walking and Moving Around Discharge Status (G6269): 0 percent impaired, limited or restricted     Beth Caedyn Tassinari, PT, DPT X: [phone]

## 2016-03-20 NOTE — Progress Notes (Signed)
Progress Note  Patient Name: [name] Date of Encounter: 03/20/2016  Primary Cardiologist: Johnny Bridge MD  Subjective   Says "I feel a whole lot better than yesterday". Denies chest pain and palpitations. Has urinated in toilet and this was not recorded.  Inpatient Medications    Scheduled Meds: . amLODipine  5 mg Oral Daily   And  . irbesartan  300 mg Oral Daily  . apixaban  2.5 mg Oral BID  . cholecalciferol  400 Units Oral Daily  . cloNIDine  0.1 mg Oral BID  . dextromethorphan-guaiFENesin  1 tablet Oral BID  . furosemide  40 mg Intravenous Q12H  . hydrocortisone   Topical BID  . insulin aspart  0-15 Units Subcutaneous TID WC  . insulin aspart  8 Units Subcutaneous TID WC  . insulin glargine  24 Units Subcutaneous Q2200  . ipratropium-albuterol  3 mL Nebulization QID  . [START ON 03/21/2016] levofloxacin (LEVAQUIN) IV  500 mg Intravenous Q48H  . metoprolol succinate  50 mg Oral Daily  . mometasone-formoterol  2 puff Inhalation BID  . pantoprazole  40 mg Oral Daily  . potassium chloride  30 mEq Oral BID  . pregabalin  50 mg Oral Daily  . rosuvastatin  10 mg Oral Daily  . senna-docusate  1 tablet Oral BID  . sodium chloride flush  3 mL Intravenous Q12H  . sodium chloride flush  3 mL Intravenous Q12H  . tamsulosin  0.4 mg Oral QPC supper  . timolol  1 drop Left Eye Daily   Continuous Infusions: . sodium chloride 250 mL (03/19/16 2226)   PRN Meds: sodium chloride, acetaminophen, bisacodyl, hydrALAZINE, ipratropium-albuterol, magnesium hydroxide, nitroGLYCERIN, ondansetron (ZOFRAN) IV, sodium chloride flush, sodium chloride flush   Vital Signs    Vitals:   03/19/16 2041 03/19/16 2221 03/20/16 0635 03/20/16 0801  BP:  138/71 130/66   Pulse:  84 88   Resp:  18 20   Temp:  98.2 F (36.8 C) 98.2 F (36.8 C)   TempSrc:  Oral Oral   SpO2: 98% 97% 98% 94%  Weight:      Height:        Intake/Output Summary (Last 24 hours) at 03/20/16 1018 Last data  filed at 03/20/16 6222  Gross per 24 hour  Intake           248.15 ml  Output              325 ml  Net           -76.85 ml   Filed Weights   03/19/16 1142  Weight: 235 lb 6.4 oz (106.8 kg)    Telemetry    Atrial fibrillation, PVC's, aberrant conduction - Personally Reviewed  ECG     - Personally Reviewed  Physical Exam   GEN: No acute distress.   Neck: No JVD Cardiac: Regular rate, irregular rhythm, no murmurs, rubs, or gallops.  Respiratory: Left basilar crackles GI: Soft, nontender, obese. MS: No edema; No deformity. Neuro:  Nonfocal  Psych: Normal affect   Labs    Chemistry Recent Labs Lab 03/19/16 1222 03/20/16 0540  NA 140 137  K 3.9 3.9  CL 102 97*  CO2 27 26  GLUCOSE 142* 328*  BUN 17 21*  CREATININE 1.57* 1.61*  CALCIUM 9.6 9.6  PROT 6.9 7.0  ALBUMIN 3.3* 3.3*  AST 22 17  ALT 19 20  ALKPHOS 105 103  BILITOT 0.7 1.1  GFRNONAA 39* 38*  GFRAA 46*  [place]     Hematology Recent Labs Lab 03/19/16 1222 03/20/16 0540  WBC 12.6* 13.2*  RBC 4.71 4.87  HGB 13.2 13.8  HCT 40.8 41.9  MCV 86.6 86.0  MCH 28.0 28.3  MCHC 32.4 32.9  RDW 13.4 13.3  PLT 258 254    Cardiac Enzymes Recent Labs Lab 03/19/16 1222  TROPONINI <0.03   No results for input(s): TROPIPOC in the last 168 hours.   BNP Recent Labs Lab 03/19/16 1222  BNP 156.0*     DDimer No results for input(s): DDIMER in the last 168 hours.   Radiology    Dg Chest 2 View  Result Date: 03/19/2016 CLINICAL DATA:  Cough, shortness of breath, and weakness. Former smoker. History of CABG. EXAM: CHEST  2 VIEW COMPARISON:  Chest x-ray of January 10, 2015 FINDINGS: The right lung is well-expanded. On the left there is patchy increased density at the lung base with small left pleural effusion. This is new. The interstitial markings of both lungs elsewhere are coarse and slightly more conspicuous overall. The cardiac silhouette is enlarged. The pulmonary vascularity is not  engorged. There is calcification in the wall of the aortic arch. The sternal wires are intact. The bony thorax exhibits no acute abnormality. IMPRESSION: Left basilar atelectasis or pneumonia with small left pleural effusion. This is superimposed upon findings of COPD and mild chronic CHF. Followup PA and lateral chest X-ray is recommended in 3-4 weeks following trial of antibiotic therapy to ensure resolution and exclude underlying malignancy. Thoracic aortic atherosclerosis. Electronically Signed   By: David  Martinique M.D.   On: 03/19/2016 13:25    Cardiac Studies   Echocardiogram (02/18/16)  - Left ventricle: The cavity size was normal. Wall thickness was   increased in a pattern of moderate to severe LVH. Systolic   function was normal. The estimated ejection fraction was in the   range of 60% to 65%. Wall motion was normal; there were no   regional wall motion abnormalities. The study was not technically   sufficient to allow evaluation of LV diastolic dysfunction due to   atrial fibrillation. - Aortic valve: Mildly calcified annulus. Trileaflet; mildly   thickened leaflets. Mean gradient (S): 4 mm Hg. Valve area (VTI):   2.1 cm^2. Valve area (Vmax): 1.96 cm^2. - Mitral valve: Mildly calcified annulus. Mildly thickened leaflets   . There was mild regurgitation. - Left atrium: The atrium was mildly dilated. - Right atrium: The atrium was mildly dilated. - Technically difficult study. Echocontrast was used to enhance   visualization.  Patient Profile     83 y.o. male with atrial fibrillation and CAD with PCI and CABG, hospitalized for progressive shortness of breath and coughing and found to have pneumonia and acute on chronic diastolic heart failure.  Assessment & Plan    1. Acute on chronic diastolic heart failure: Chest xray showed small left pleural effusion and mild chronic CHF with left basilar pneumonia. BNP only mildly elevated (156).  He has improved with respect to shortness  of breath.  I suspect I/O's are inaccurate as noted above, with urinary output which was not measured. Plan is for DCCV later today to further assist with diuresis. BUN up to 21 from 17, creatinine 1.61 from 1.57. Can continue IV Lasix 40 mg bid today, but would probably reduce to IV Lasix 40 mg daily on 3/17.  2. Persistent atrial fibrillation with CHADSVASC score of 5: Anticoagulated with Eliquis. He is currently on Toprol-XL  50 mg daily. This may need to be reduced after DCCV today if successful.  Left atrium only mildly dilated so reasonable chance for achieving sinus rhythm.  3. Multivessel CAD status post prior PCI's and CABG: Recent Lexiscan Myoview was low risk. Continue metoprolol and Crestor.  4. Pneumonia: On IV Levaquin. WBC's 13.2, 12.6 yesterday.  5. Hyperlipidemia: On Crestor. LDL 95. Consider outpatient medication adjustment to achieve lower LDL.  Signed, Kate Sable, MD  03/20/2016, 10:18 AM

## 2016-03-20 NOTE — Anesthesia Procedure Notes (Signed)
Procedure Name: MAC Date/Time: 03/20/2016 1:51 PM Performed by: Vista Deck Pre-anesthesia Checklist: Patient identified, Emergency Drugs available, Suction available, Timeout performed and Patient being monitored Patient Re-evaluated:Patient Re-evaluated prior to inductionOxygen Delivery Method: Non-rebreather mask

## 2016-03-20 NOTE — H&P (Signed)
Preprocedure H&P, please see Dr McDowell's recent clinic note documented below for full history. Patient with persistent afib on eliquis at home referred for DCCV. We will plan for procedure this afternoon.    Phillip Abts MD    Cardiology Office Note  Date: 03/19/2016   ID: [name], DOB [birth year], MRN [id number]  PCP: Maricela Curet, MD  Primary Cardiologist: Rozann Lesches, MD      Chief Complaint  Patient presents with  . Atrial Fibrillation    History of Present Illness: [name] is an 83 y.o. male that I saw her recently in consultation in February. He presents today with family members for a follow-up visit. States that he has been progressively short of breath with intermittent coughing. He saw Dr. Cindie Laroche and was placed back on Lasix after running out of the medication for several days. Still not feeling any better. He is not reporting any chest pain.  CHADSVASC score is 5. He has been on Eliquis for stroke prophylaxis over the last few months. Also on Toprol-XL for heart rate control. We have discussed the possibility of cardioversion.  Lexiscan Myoview done in February showed no significant ischemia with normal LVEF, low risk study. We discussed the results today. Recent echocardiogram also confirms normal LVEF.  Today we discussed his medications and further management plans. They tell me that Dr. Cindie Laroche had already mentioned hospitalization which I think is reasonable.      Past Medical History:  Diagnosis Date  . BPH (benign prostatic hyperplasia)   . CAD (coronary artery disease)    Multivessel status post CABG 2011 - LIMA to LAD, SVG to diagonal, SVG to OM, SVG to PDA  . Cellulitis    12/15  . Chronic back pain   . Essential hypertension   . Gastric mass    EGD 9/15  . GERD (gastroesophageal reflux disease)   . History of DVT (deep vein thrombosis)    Postphlebitic syndrome  . History of kidney stones   . HOH  (hard of hearing)   . Hx of CABG   . Hyperlipidemia   . Sleep apnea    Stop Bang score of 5  . Type 2 diabetes mellitus ([place])          Past Surgical History:  Procedure Laterality Date  . CATARACT EXTRACTION W/PHACO Left 02/07/2016   Procedure: CATARACT EXTRACTION PHACO AND INTRAOCULAR LENS PLACEMENT (IOC);  Surgeon: [name], MD;  Location: [facility];  Service: Ophthalmology;  Laterality: Left;  CDE:  23.13  . COLONOSCOPY  2004   Dr. Laural Golden: three small polyps at cecum, path unknown, external hemorrhoids  . COLONOSCOPY N/A 08/16/2013   Dr. [name]: incomplete prep. multiple tubular adenomas, multiple biopsies. Needs surveillance in Aug 2016 due to poor prep  . CORONARY ARTERY BYPASS GRAFT     x5  . CORONARY ARTERY BYPASS GRAFT  2011  . CYSTOSCOPY N/A 12/12/2012   Procedure: CYSTOSCOPY FLEXIBLE;  Surgeon: Marissa Nestle, MD;  Location: [facility];  Service: Urology;  Laterality: N/A;  . ESOPHAGOGASTRODUODENOSCOPY N/A 08/16/2013   Dr. [name]: normal esophagus s/p Maloney dilation, gastric erosions, submucosal gastric mass vs extrinsic mass  . EUS N/A 09/07/2013   Dr. 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 INCISION AND DRAINAGE ABSCESS N/A 12/20/2013   Procedure: INCISION AND DRAINAGE ABSCESS NECK;  Surgeon: Jamesetta So, MD;  Location: [facility];  Service: General;  Laterality: N/A;  . MALONEY DILATION N/A 08/16/2013  Procedure: MALONEY DILATION;  Surgeon: Daneil Dolin, MD;  Location: [facility];  Service: Endoscopy;  Laterality: N/A;          Current Outpatient Prescriptions  Medication Sig Dispense Refill  . amLODipine-olmesartan (AZOR) 5-40 MG per tablet Take 1 tablet by mouth daily.    [name] apixaban (ELIQUIS) 5 MG TABS tablet Take 5 mg by mouth 2 (two) times daily.    . beclomethasone (QVAR) 80 MCG/ACT inhaler Inhale 2 puffs into the lungs 2 (two) times daily.    [name] Besifloxacin HCl (BESIVANCE) 0.6 % SUSP Place 1 drop into the left  eye 2 (two) times daily.     . Cholecalciferol (VITAMIN D3) 400 units tablet Take 400 Units by mouth daily.    . cloNIDine (CATAPRES) 0.1 MG tablet Take 0.1 mg by mouth 2 (two) times daily.    [name] dextromethorphan-guaiFENesin (MUCINEX DM) 30-600 MG 12hr tablet Take 1 tablet by mouth 2 (two) times daily.    . furosemide (LASIX) 20 MG tablet Take 20 mg by mouth 2 (two) times daily.    . insulin aspart (NOVOLOG FLEXPEN) 100 UNIT/ML FlexPen Inject 20 Units into the skin 2 (two) times daily with a meal.    . Insulin Glargine (LANTUS SOLOSTAR) 100 UNIT/ML Solostar Pen Inject 45 Units into the skin daily at 6 PM.     . metoprolol succinate (TOPROL-XL) 50 MG 24 hr tablet Take 50 mg by mouth daily. Take with or immediately following a meal.    . nepafenac (ILEVRO) 0.3 % ophthalmic suspension Place 1 drop into the left eye daily.    . nitroGLYCERIN (NITROSTAT) 0.4 MG SL tablet Place 1 tablet (0.4 mg total) under the tongue every 5 (five) minutes as needed for chest pain. 30 tablet 12  . Omega-3 Fatty Acids (OMEGA 3 PO) Take 1 capsule by mouth daily.    [name] oxymetazoline (AFRIN) 0.05 % nasal spray Place 1 spray into both nostrils 2 (two) times daily.    . pantoprazole (PROTONIX) 40 MG tablet Take 40 mg by mouth daily.    . pregabalin (LYRICA) 50 MG capsule Take 50 mg by mouth daily.    . rosuvastatin (CRESTOR) 10 MG tablet Take 10 mg by mouth daily.    . silodosin (RAPAFLO) 8 MG CAPS capsule Take 8 mg by mouth daily with breakfast.    . timolol (BETIMOL) 0.5 % ophthalmic solution Place 1 drop into the left eye daily.     No current facility-administered medications for this visit.    Allergies:  Patient has no known allergies.   Social History: The patient  reports that he quit smoking about 60 years ago. He has a 11.00 pack-year smoking history. He has never used smokeless tobacco. He reports that he does not drink alcohol or use drugs.   Family History: The patient's  family history includes Colon cancer (age of onset: 41) in his son.   ROS:  Please see the history of present illness. Otherwise, complete review of systems is positive for fatigue, shortness of breath.  All other systems are reviewed and negative.   Physical Exam: VS:  BP (!) 120/58   Pulse 92   Ht 5' 11.5" (1.816 m)   Wt 235 lb (106.6 kg)   SpO2 90%   BMI 32.32 kg/m , BMI Body mass index is 32.32 kg/m.     Wt Readings from Last 3 Encounters:  03/19/16 235 lb (106.6 kg)  02/20/16 237 lb (107.5 kg)  02/07/16 230  lb (104.3 kg)    General: Overweight elderly male,appears mildly short of breath. HEENT: Conjunctiva and lids normal, oropharynx clear with poor dentition. Neck: Supple, no elevated JVP or carotid bruits, no thyromegaly. Lungs: Crackles noted on the left side, nonlabored breathing at rest. Cardiac: Irregularly irregular, no S3, softsystolic murmur, no pericardial rub. Abdomen: Obese, nontender, bowel sounds present. Extremities: Chrronic appearing edema and venous stasis changes, distal pulses 1-2+. Skin: Warm and dry. Musculoskeletal: No kyphosis. Neuropsychiatric: Alert and oriented x3, affect grossly appropriate.  ECG: I personally reviewed the tracing from 02/10/2016 which showed atrial fibrillation with intermittent PVCs versus aberrantly conducted complexes. Nonspecific T-wave changes.  Recent Labwork: 01/03/2016: BUN 33; Creatinine, Ser 1.53; Hemoglobin 15.4; Platelets 173; Potassium 4.0; Sodium 140   Other Studies Reviewed Today:  Lexiscan Myoview 02/07/2016:  The study is normal. No myocardial ischemia or scar.  This is a low risk study.  Nuclear stress EF: 54%.  Nonspecific ST-T abnormalities in inferolateral leads.  Echocardiogram 02/18/2016: Study Conclusions  - Left ventricle: The cavity size was normal. Wall thickness was increased in a pattern of moderate to severe LVH. Systolic function was normal. The estimated ejection fraction  was in the range of 60% to 65%. Wall motion was normal; there were no regional wall motion abnormalities. The study was not technically sufficient to allow evaluation of LV diastolic dysfunction due to atrial fibrillation. - Aortic valve: Mildly calcified annulus. Trileaflet; mildly thickened leaflets. Mean gradient (S): 4 mm Hg. Valve area (VTI): 2.1 cm^2. Valve area (Vmax): 1.96 cm^2. - Mitral valve: Mildly calcified annulus. Mildly thickened leaflets . There was mild regurgitation. - Left atrium: The atrium was mildly dilated. - Right atrium: The atrium was mildly dilated. - Technically difficult study. Echocontrast was used to enhance visualization.  Assessment and Plan:  1. Persistent atrial fibrillation with CHADSVASC score of 5. Suspect that at least some of her shortness of breath is related, and we have discussed elective cardioversion previously. Ischemic workup was low risk and his LVEF is normal. He has been on Eliquis for the last few months and continues on Toprol-XL. Plan is admission to the hospital for treatment of suspected volume overload and potential acute on chronic diastolic heart failure, rule out bronchitis with chest x-ray pending. We will get him on the schedule for a cardioversion tomorrow afternoon with IV diuresis today.  2. Possible acute on chronic diastolic heart failure. I spoke with Dr. Cindie Laroche and ultimately Dr. Wynetta Emery on the hospitalist team regarding admission. No telemetry bed available for direct admission so he will go through the ER and can at least get started on IV Lasix. Would start Lasix 40 m IV twice daily. Also needs a PA and lateral chest x-ray.  3. Multivessel CAD status post prior PCI's and CABG. Recent Lexiscan Myoview was low risk.  4. History of prior DVT and postphlebitic syndrome.  5. Hyperlipidemia, on Crestor.  Current medicines were reviewed with the patient today.  Disposition: Patient being taken from  office to ER to initiation hospitalization.  Signed, Satira Sark, MD, [facility] 03/19/2016 10:01 AM    [place] [facility] [address] [place], [place] [address] Phone: [phone]; Fax: [phone]

## 2016-03-20 NOTE — Anesthesia Preprocedure Evaluation (Signed)
Anesthesia Evaluation  Patient identified by MRN, date of birth, ID band Patient awake    Reviewed: Allergy & Precautions, H&P , NPO status , Patient's Chart, lab work & pertinent test results  Airway Mallampati: II  TM Distance: >3 FB Neck ROM: Full    Dental  (+) Edentulous Upper, Edentulous Lower   Pulmonary shortness of breath, sleep apnea , former smoker,    Pulmonary exam normal breath sounds clear to auscultation       Cardiovascular hypertension, Pt. on medications + CAD, + CABG, +CHF and + DVT  negative cardio ROS  + dysrhythmias Atrial Fibrillation  Rhythm:Regular Rate:Normal     Neuro/Psych negative neurological ROS  negative psych ROS   GI/Hepatic Neg liver ROS, GERD  Medicated,  Endo/Other  diabetes, Type 2, Insulin Dependent  Renal/GU negative Renal ROS  negative genitourinary   Musculoskeletal negative musculoskeletal ROS (+)   Abdominal   Peds negative pediatric ROS (+)  Hematology negative hematology ROS (+)   Anesthesia Other Findings   Reproductive/Obstetrics negative OB ROS                             Anesthesia Physical Anesthesia Plan  ASA: III  Anesthesia Plan: MAC   Post-op Pain Management:    Induction: Intravenous  Airway Management Planned: Simple Face Mask  Additional Equipment:   Intra-op Plan:   Post-operative Plan:   Informed Consent: I have reviewed the patients History and Physical, chart, labs and discussed the procedure including the risks, benefits and alternatives for the proposed anesthesia with the patient or authorized representative who has indicated his/her understanding and acceptance.     Plan Discussed with:   Anesthesia Plan Comments:         Anesthesia Quick Evaluation

## 2016-03-20 NOTE — Progress Notes (Addendum)
PT Cancellation Note  Patient Details Name: [name] MRN: [id number] DOB: 1931-11-28   Cancelled Treatment:    Reason Eval/Treat Not Completed: Patient not medically ready (Pt scheduled for a DCCV this afternoon at 2:30 due to Afib with RVR, multifocal PVC, and BBB.  Not appropriate for PT this AM. Will check back after procedure.  )   Beth Apoorva Bugay, PT, DPT X: [phone]

## 2016-03-20 NOTE — Care Management Note (Signed)
Case Management Note  Patient Details  Name: [name] MRN: [id number] Date of Birth: [birth year]  Subjective/Objective: Adm with CAP/CHF/Afib. He is from home, lives with friends, all present in room. States they are his family. He is ind with ADL's. Has neb machine, cane, walker, WC and scooter if needed. He still drives, has PCP and reports no issues affording medications. PT eval pending. Patient scheduled for cardioversion later today. Patient states he will be agreeable to anything recommended by PT. No current Home health.            Action/Plan: CM will follow for needs. Anticipate DC home with self care.    Expected Discharge Date:       03/21/2016           Expected Discharge Plan:  Home/Self Care  In-House Referral:     Discharge planning Services  CM Consult  Post Acute Care Choice:    Choice offered to:     DME Arranged:    DME Agency:     HH Arranged:    HH Agency:     Status of Service:  In process, will continue to follow  If discussed at Long Length of Stay Meetings, dates discussed:    Additional Comments:  Ariq Khamis, Chauncey Reading, RN 03/20/2016, 12:30 PM

## 2016-03-20 NOTE — Progress Notes (Signed)
Electrical Cardioversion Procedure Note [name] [id number] 05-01-1931   Procedure: Electrical Cardioversion Indications:  persistent atrial fibrillation  Procedure Details Consent: Direct elective cardioversion Time Out: Verified patient identification, verified procedure, site/side was marked, verified correct patient position, special equipment/implants available, medications/allergies/relevent history reviewed, required imaging and test results available.  Time out 1410  Patient placed on cardiac monitor, pulse oximetry, supplemental oxygen as necessary.  Sedation given: per anesthesia Pacer pads placed posterior and anterior pads  Cardioverted with 200 jules one  time  Cardioverted at 1414  Evaluation Findings: Post procedure EKG shows: sinus rhythm with 1st degree AV block with occasional PVC Complications: none Patient tolerate procedure well.   Sofie Rower R 03/20/2016, 2:12 PM

## 2016-03-21 LAB — CBC WITH DIFFERENTIAL/PLATELET
BASOS PCT: 0 %
Basophils Absolute: 0.1 10*3/uL (ref 0.0–0.1)
Eosinophils Absolute: 0.1 10*3/uL (ref 0.0–0.7)
Eosinophils Relative: 0 %
HEMATOCRIT: 39.1 % (ref 39.0–52.0)
Hemoglobin: 12.6 g/dL — ABNORMAL LOW (ref 13.0–17.0)
Lymphocytes Relative: 8 %
Lymphs Abs: 1.4 10*3/uL (ref 0.7–4.0)
MCH: 28.1 pg (ref 26.0–34.0)
MCHC: 32.2 g/dL (ref 30.0–36.0)
MCV: 87.3 fL (ref 78.0–100.0)
MONO ABS: 1.4 10*3/uL — AB (ref 0.1–1.0)
MONOS PCT: 8 %
NEUTROS ABS: 13.7 10*3/uL — AB (ref 1.7–7.7)
Neutrophils Relative %: 84 %
Platelets: 252 10*3/uL (ref 150–400)
RBC: 4.48 MIL/uL (ref 4.22–5.81)
RDW: 13.5 % (ref 11.5–15.5)
WBC: 16.6 10*3/uL — ABNORMAL HIGH (ref 4.0–10.5)

## 2016-03-21 LAB — MAGNESIUM: Magnesium: 1.9 mg/dL (ref 1.7–2.4)

## 2016-03-21 LAB — COMPREHENSIVE METABOLIC PANEL
ALBUMIN: 3.1 g/dL — AB (ref 3.5–5.0)
ALK PHOS: 91 U/L (ref 38–126)
ALT: 19 U/L (ref 17–63)
ANION GAP: 9 (ref 5–15)
AST: 19 U/L (ref 15–41)
BUN: 32 mg/dL — ABNORMAL HIGH (ref 6–20)
CALCIUM: 9.6 mg/dL (ref 8.9–10.3)
CO2: 30 mmol/L (ref 22–32)
Chloride: 99 mmol/L — ABNORMAL LOW (ref 101–111)
Creatinine, Ser: 1.79 mg/dL — ABNORMAL HIGH (ref 0.61–1.24)
GFR calc Af Amer: 39 mL/min — ABNORMAL LOW (ref >60)
GFR calc non Af Amer: 34 mL/min — ABNORMAL LOW (ref >60)
GLUCOSE: 232 mg/dL — AB (ref 65–99)
Potassium: 3.8 mmol/L (ref 3.5–5.1)
SODIUM: 138 mmol/L (ref 135–145)
Total Bilirubin: 0.6 mg/dL (ref 0.3–1.2)
Total Protein: 6.3 g/dL — ABNORMAL LOW (ref 6.5–8.1)

## 2016-03-21 LAB — GLUCOSE, CAPILLARY: GLUCOSE-CAPILLARY: 240 mg/dL — AB (ref 65–99)

## 2016-03-21 MED ORDER — LEVOFLOXACIN 500 MG PO TABS: 500.0000 mg | ORAL_TABLET | Freq: Every day | ORAL | 0 refills | Status: DC

## 2016-03-21 MED ORDER — POTASSIUM CHLORIDE ER 8 MEQ PO TBCR: 8.0000 meq | EXTENDED_RELEASE_TABLET | Freq: Every day | ORAL | 2 refills | Status: DC

## 2016-03-21 NOTE — Progress Notes (Signed)
Patient discharged home. IV removed and site intact. Patient discharged with all personal belongings. Reviewed discharge papers with patient and prescriptions. Patient verbalized understanding prescriptions and discharge papers.

## 2016-03-21 NOTE — Discharge Summary (Signed)
Physician Discharge Summary  [name] ZOX:[id number] DOB: June 20, 1931 DOA: 03/19/2016  PCP: Maricela Curet, MD  Admit date: 03/19/2016 Discharge date: 03/21/2016   Recommendations for Outpatient Follow-up:  Patient is advised to take all prehospital admissions as prescribed in addition he is to take Levaquin 500 mg by mouth daily for 7 additional days he is to follow-up in the office in 3-6 days time to assess hemodynamics cardiac rhythm status and electrolytes Discharge Diagnoses:  Principal Problem:   CAP (community acquired pneumonia) Active Problems:   Lower extremity edema   Dyspnea   Essential hypertension   History of DVT (deep vein thrombosis)   Diabetes (HCC)   Chronic back pain   Pulmonary vascular congestion   Dysphagia   Acute diastolic CHF (congestive heart failure) [facility])   Discharge Condition: Good  Filed Weights   03/19/16 1142 03/20/16 1246  Weight: 106.8 kg (235 lb 6.4 oz) 103.8 kg (228 lb 14.4 oz)    History of present illness:  The patient is an 83 year old white male with multiple medical problems including diastolic dysfunction chronic atrial fibrillation for several months duration was previously on L requests for this duration he had some cough dyspnea was discussed with cardiology myself felt best to cardiovert him he was admitted successfully electrolytes any melena within normal parameters. Cardioversion with 200 J with successful conversion to sinus rhythm he was hemodynamically stable upon admission this chest x-ray revealed left lower lobe atelectasis possible small effusion question of infiltrate placed empirically on Levaquin 500 milligrams by mouth daily which will be continued for 7 additional days at home he'll follow-up in my office in 3-6 days time  Hospital Course:  See history of present illness above  Procedures:  Cardioversion with 20 J  Consultations:  Cardiology  Discharge Instructions  Discharge Instructions    Discharge instructions    Complete by:  As directed    Discharge patient    Complete by:  As directed    Discharge disposition:  01-Home or Self Care   Discharge patient date:  03/21/2016     Allergies as of 03/21/2016   No Known Allergies     Medication List    STOP taking these medications   beclomethasone 80 MCG/ACT inhaler Commonly known as:  QVAR   pregabalin 50 MG capsule Commonly known as:  LYRICA     TAKE these medications   AZOR 5-40 MG tablet Generic drug:  amLODipine-olmesartan Take 1 tablet by mouth daily.   BESIVANCE 0.6 % Susp Generic drug:  Besifloxacin HCl Place 1 drop into the left eye 2 (two) times daily.   budesonide-formoterol 160-4.5 MCG/ACT inhaler Commonly known as:  SYMBICORT Inhale 2 puffs into the lungs 2 (two) times daily.   cloNIDine 0.1 MG tablet Commonly known as:  CATAPRES Take 0.1 mg by mouth 2 (two) times daily.   dextromethorphan-guaiFENesin 30-600 MG 12hr tablet Commonly known as:  MUCINEX DM Take 1 tablet by mouth 2 (two) times daily.   ELIQUIS 5 MG Tabs tablet Generic drug:  apixaban Take 5 mg by mouth 2 (two) times daily.   furosemide 20 MG tablet Commonly known as:  LASIX Take 20 mg by mouth 2 (two) times daily.   ILEVRO 0.3 % ophthalmic suspension Generic drug:  nepafenac Place 1 drop into the left eye daily.   insulin lispro 100 UNIT/ML injection Commonly known as:  HUMALOG Inject into the skin. Per sliding scale   ipratropium-albuterol 0.5-2.5 (3) MG/3ML Soln Commonly known as:  DUONEB Take 3 mLs by nebulization every 6 (six) hours as needed (wheezing).   LANTUS SOLOSTAR 100 UNIT/ML Solostar Pen Generic drug:  Insulin Glargine Inject 45 Units into the skin daily at 6 PM.   metoprolol succinate 50 MG 24 hr tablet Commonly known as:  TOPROL-XL Take 50 mg by mouth daily. Take with or immediately following a meal.   nitroGLYCERIN 0.4 MG SL tablet Commonly known as:  NITROSTAT Place 1 tablet (0.4 mg total)  under the tongue every 5 (five) minutes as needed for chest pain.   OMEGA 3 PO Take 1 capsule by mouth daily.   oxymetazoline 0.05 % nasal spray Commonly known as:  AFRIN Place 1 spray into both nostrils 2 (two) times daily.   pantoprazole 40 MG tablet Commonly known as:  PROTONIX Take 40 mg by mouth daily.   potassium chloride 8 MEQ tablet Commonly known as:  KLOR-CON Take 1 tablet (8 mEq total) by mouth daily.   rosuvastatin 10 MG tablet Commonly known as:  CRESTOR Take 10 mg by mouth daily.   silodosin 8 MG Caps capsule Commonly known as:  RAPAFLO Take 8 mg by mouth daily with breakfast.   timolol 0.5 % ophthalmic solution Commonly known as:  BETIMOL Place 1 drop into the left eye daily.   Vitamin D3 400 units tablet Take 400 Units by mouth daily.      No Known Allergies    The results of significant diagnostics from this hospitalization (including imaging, microbiology, ancillary and laboratory) are listed below for reference.    Significant Diagnostic Studies: Dg Chest 2 View  Result Date: 03/19/2016 CLINICAL DATA:  Cough, shortness of breath, and weakness. Former smoker. History of CABG. EXAM: CHEST  2 VIEW COMPARISON:  Chest x-ray of January 10, 2015 FINDINGS: The right lung is well-expanded. On the left there is patchy increased density at the lung base with small left pleural effusion. This is new. The interstitial markings of both lungs elsewhere are coarse and slightly more conspicuous overall. The cardiac silhouette is enlarged. The pulmonary vascularity is not engorged. There is calcification in the wall of the aortic arch. The sternal wires are intact. The bony thorax exhibits no acute abnormality. IMPRESSION: Left basilar atelectasis or pneumonia with small left pleural effusion. This is superimposed upon findings of COPD and mild chronic CHF. Followup PA and lateral chest X-ray is recommended in 3-4 weeks following trial of antibiotic therapy to ensure  resolution and exclude underlying malignancy. Thoracic aortic atherosclerosis. Electronically Signed   By: David  Martinique M.D.   On: 03/19/2016 13:25   Nm Myocar Multi W/spect W/wall Motion / Ef  Result Date: 02/27/2016  The study is normal. No myocardial ischemia or scar.  This is a low risk study.  Nuclear stress EF: 54%.  Nonspecific ST-T abnormalities in inferolateral leads.     Microbiology: Recent Results (from the past 240 hour(s))  Culture, blood (Routine X 2) w Reflex to ID Panel     Status: None (Preliminary result)   Collection Time: 03/19/16  3:18 PM  Result Value Ref Range Status   Specimen Description LEFT ANTECUBITAL  Final   Special Requests BOTTLES DRAWN AEROBIC AND ANAEROBIC 4CC EACH  Final   Culture NO GROWTH < 24 HOURS  Final   Report Status PENDING  Incomplete  Culture, blood (Routine X 2) w Reflex to ID Panel     Status: None (Preliminary result)   Collection Time: 03/19/16  3:48 PM  Result Value Ref Range  Status   Specimen Description RIGHT ANTECUBITAL  Final   Special Requests BOTTLES DRAWN AEROBIC AND ANAEROBIC 4CC EACH  Final   Culture NO GROWTH < 24 HOURS  Final   Report Status PENDING  Incomplete  Culture, expectorated sputum-assessment     Status: None   Collection Time: 03/20/16 11:00 AM  Result Value Ref Range Status   Specimen Description EXPECTORATED SPUTUM  Final   Special Requests NONE  Final   Sputum evaluation   Final    Sputum specimen not acceptable for testing.  Please recollect.   SPOKE WITH DILDY,V @ 1141 ON 3.16.18 ASKING TO RECOLLECT    Report Status 03/20/2016 FINAL  Final     Labs: Basic Metabolic Panel:  Recent Labs Lab 03/19/16 1222 03/20/16 0540 03/21/16 0556  NA 140 137 138  K 3.9 3.9 3.8  CL 102 97* 99*  CO2 27 26 30   GLUCOSE 142* 328* 232*  BUN 17 21* 32*  CREATININE 1.57* 1.61* 1.79*  CALCIUM 9.6 9.6 9.6  MG  --  1.7 1.9   Liver Function Tests:  Recent Labs Lab 03/19/16 1222 03/20/16 0540 03/21/16 0556    AST 22 17 19   ALT 19 20 19   ALKPHOS 105 103 91  BILITOT 0.7 1.1 0.6  PROT 6.9 7.0 6.3*  ALBUMIN 3.3* 3.3* 3.1*   No results for input(s): LIPASE, AMYLASE in the last 168 hours. No results for input(s): AMMONIA in the last 168 hours. CBC:  Recent Labs Lab 03/19/16 1222 03/20/16 0540 03/21/16 0556  WBC 12.6* 13.2* 16.6*  NEUTROABS  --  12.7* 13.7*  HGB 13.2 13.8 12.6*  HCT 40.8 41.9 39.1  MCV 86.6 86.0 87.3  PLT 258 254 252   Cardiac Enzymes:  Recent Labs Lab 03/19/16 1222  TROPONINI <0.03   BNP: BNP (last 3 results)  Recent Labs  03/19/16 1222  BNP 156.0*    ProBNP (last 3 results) No results for input(s): PROBNP in the last 8760 hours.  CBG:  Recent Labs Lab 03/20/16 1109 03/20/16 1309 03/20/16 1555 03/20/16 2121 03/21/16 0801  GLUCAP 247* 201* 123* 230* 240*       Signed:  [facility] Pager: [phone] 03/21/2016, 8:58 AM

## 2016-03-23 ENCOUNTER — Encounter (HOSPITAL_COMMUNITY): Payer: Self-pay | Admitting: Cardiology

## 2016-03-24 LAB — CULTURE, BLOOD (ROUTINE X 2)
Culture: NO GROWTH
Culture: NO GROWTH

## 2016-05-27 ENCOUNTER — Other Ambulatory Visit (HOSPITAL_COMMUNITY): Payer: Self-pay | Admitting: Family Medicine

## 2016-05-27 DIAGNOSIS — M545 Low back pain: Principal | ICD-10-CM

## 2016-05-27 DIAGNOSIS — G8929 Other chronic pain: Secondary | ICD-10-CM

## 2016-06-05 ENCOUNTER — Ambulatory Visit (HOSPITAL_COMMUNITY)
Admission: RE | Admit: 2016-06-05 | Discharge: 2016-06-05 | Disposition: A | Discharge status: Home / Self Care | Payer: Medicare Other | Source: Ambulatory Visit | Attending: Family Medicine | Admitting: Family Medicine

## 2016-06-05 DIAGNOSIS — M5137 Other intervertebral disc degeneration, lumbosacral region: Secondary | ICD-10-CM

## 2016-06-05 DIAGNOSIS — G8929 Other chronic pain: Secondary | ICD-10-CM

## 2016-06-05 DIAGNOSIS — M5136 Other intervertebral disc degeneration, lumbar region: Secondary | ICD-10-CM

## 2016-06-05 DIAGNOSIS — R079 Chest pain, unspecified: Secondary | ICD-10-CM | POA: Diagnosis not present

## 2016-06-05 DIAGNOSIS — M5126 Other intervertebral disc displacement, lumbar region: Secondary | ICD-10-CM | POA: Insufficient documentation

## 2016-06-05 DIAGNOSIS — I130 Hypertensive heart and chronic kidney disease with heart failure and stage 1 through stage 4 chronic kidney disease, or unspecified chronic kidney disease: Secondary | ICD-10-CM | POA: Diagnosis not present

## 2016-06-05 DIAGNOSIS — M545 Low back pain: Principal | ICD-10-CM

## 2016-06-08 ENCOUNTER — Emergency Department (HOSPITAL_COMMUNITY): Payer: Medicare Other

## 2016-06-08 ENCOUNTER — Inpatient Hospital Stay (HOSPITAL_COMMUNITY)
Admission: EM | Admit: 2016-06-08 | Discharge: 2016-06-15 | DRG: 291 | Disposition: A | Discharge status: Home Health | Payer: Medicare Other | Attending: Family Medicine | Admitting: Family Medicine

## 2016-06-08 ENCOUNTER — Encounter (HOSPITAL_COMMUNITY): Payer: Self-pay

## 2016-06-08 DIAGNOSIS — Z794 Long term (current) use of insulin: Secondary | ICD-10-CM

## 2016-06-08 DIAGNOSIS — R0601 Orthopnea: Secondary | ICD-10-CM | POA: Diagnosis not present

## 2016-06-08 DIAGNOSIS — G8929 Other chronic pain: Secondary | ICD-10-CM

## 2016-06-08 DIAGNOSIS — I2510 Atherosclerotic heart disease of native coronary artery without angina pectoris: Secondary | ICD-10-CM | POA: Diagnosis present

## 2016-06-08 DIAGNOSIS — E1122 Type 2 diabetes mellitus with diabetic chronic kidney disease: Secondary | ICD-10-CM | POA: Diagnosis present

## 2016-06-08 DIAGNOSIS — Z9841 Cataract extraction status, right eye: Secondary | ICD-10-CM | POA: Diagnosis not present

## 2016-06-08 DIAGNOSIS — N183 Chronic kidney disease, stage 3 unspecified: Secondary | ICD-10-CM

## 2016-06-08 DIAGNOSIS — I083 Combined rheumatic disorders of mitral, aortic and tricuspid valves: Secondary | ICD-10-CM | POA: Diagnosis present

## 2016-06-08 DIAGNOSIS — N179 Acute kidney failure, unspecified: Secondary | ICD-10-CM

## 2016-06-08 DIAGNOSIS — J449 Chronic obstructive pulmonary disease, unspecified: Secondary | ICD-10-CM | POA: Diagnosis not present

## 2016-06-08 DIAGNOSIS — Z9842 Cataract extraction status, left eye: Secondary | ICD-10-CM | POA: Diagnosis not present

## 2016-06-08 DIAGNOSIS — L89102 Pressure ulcer of unspecified part of back, stage 2: Secondary | ICD-10-CM | POA: Diagnosis not present

## 2016-06-08 DIAGNOSIS — I5032 Chronic diastolic (congestive) heart failure: Secondary | ICD-10-CM | POA: Diagnosis present

## 2016-06-08 DIAGNOSIS — I481 Persistent atrial fibrillation: Secondary | ICD-10-CM | POA: Diagnosis present

## 2016-06-08 DIAGNOSIS — E785 Hyperlipidemia, unspecified: Secondary | ICD-10-CM | POA: Diagnosis present

## 2016-06-08 DIAGNOSIS — I130 Hypertensive heart and chronic kidney disease with heart failure and stage 1 through stage 4 chronic kidney disease, or unspecified chronic kidney disease: Principal | ICD-10-CM | POA: Diagnosis present

## 2016-06-08 DIAGNOSIS — R600 Localized edema: Secondary | ICD-10-CM

## 2016-06-08 DIAGNOSIS — E0800 Diabetes mellitus due to underlying condition with hyperosmolarity without nonketotic hyperglycemic-hyperosmolar coma (NKHHC): Secondary | ICD-10-CM

## 2016-06-08 DIAGNOSIS — I10 Essential (primary) hypertension: Secondary | ICD-10-CM | POA: Diagnosis present

## 2016-06-08 DIAGNOSIS — Z800 Family history of malignant neoplasm of digestive organs: Secondary | ICD-10-CM

## 2016-06-08 DIAGNOSIS — Z86010 Personal history of colonic polyps: Secondary | ICD-10-CM

## 2016-06-08 DIAGNOSIS — Z86718 Personal history of other venous thrombosis and embolism: Secondary | ICD-10-CM | POA: Diagnosis not present

## 2016-06-08 DIAGNOSIS — Z9861 Coronary angioplasty status: Secondary | ICD-10-CM

## 2016-06-08 DIAGNOSIS — R079 Chest pain, unspecified: Secondary | ICD-10-CM

## 2016-06-08 DIAGNOSIS — J189 Pneumonia, unspecified organism: Secondary | ICD-10-CM

## 2016-06-08 DIAGNOSIS — K222 Esophageal obstruction: Secondary | ICD-10-CM

## 2016-06-08 DIAGNOSIS — N400 Enlarged prostate without lower urinary tract symptoms: Secondary | ICD-10-CM | POA: Diagnosis present

## 2016-06-08 DIAGNOSIS — Z961 Presence of intraocular lens: Secondary | ICD-10-CM | POA: Diagnosis present

## 2016-06-08 DIAGNOSIS — R1310 Dysphagia, unspecified: Secondary | ICD-10-CM | POA: Diagnosis not present

## 2016-06-08 DIAGNOSIS — I5021 Acute systolic (congestive) heart failure: Secondary | ICD-10-CM

## 2016-06-08 DIAGNOSIS — J181 Lobar pneumonia, unspecified organism: Secondary | ICD-10-CM

## 2016-06-08 DIAGNOSIS — Z951 Presence of aortocoronary bypass graft: Secondary | ICD-10-CM | POA: Diagnosis not present

## 2016-06-08 DIAGNOSIS — R0602 Shortness of breath: Secondary | ICD-10-CM | POA: Diagnosis not present

## 2016-06-08 DIAGNOSIS — I4819 Other persistent atrial fibrillation: Secondary | ICD-10-CM

## 2016-06-08 DIAGNOSIS — I4891 Unspecified atrial fibrillation: Secondary | ICD-10-CM

## 2016-06-08 DIAGNOSIS — Z87891 Personal history of nicotine dependence: Secondary | ICD-10-CM

## 2016-06-08 DIAGNOSIS — K3189 Other diseases of stomach and duodenum: Secondary | ICD-10-CM

## 2016-06-08 DIAGNOSIS — I5033 Acute on chronic diastolic (congestive) heart failure: Secondary | ICD-10-CM | POA: Diagnosis present

## 2016-06-08 DIAGNOSIS — R0600 Dyspnea, unspecified: Secondary | ICD-10-CM

## 2016-06-08 DIAGNOSIS — M5440 Lumbago with sciatica, unspecified side: Secondary | ICD-10-CM

## 2016-06-08 DIAGNOSIS — K219 Gastro-esophageal reflux disease without esophagitis: Secondary | ICD-10-CM | POA: Diagnosis present

## 2016-06-08 DIAGNOSIS — D649 Anemia, unspecified: Secondary | ICD-10-CM | POA: Diagnosis present

## 2016-06-08 DIAGNOSIS — R4702 Dysphasia: Secondary | ICD-10-CM | POA: Diagnosis present

## 2016-06-08 DIAGNOSIS — H9190 Unspecified hearing loss, unspecified ear: Secondary | ICD-10-CM | POA: Diagnosis present

## 2016-06-08 DIAGNOSIS — G4730 Sleep apnea, unspecified: Secondary | ICD-10-CM | POA: Diagnosis present

## 2016-06-08 DIAGNOSIS — Z7901 Long term (current) use of anticoagulants: Secondary | ICD-10-CM

## 2016-06-08 DIAGNOSIS — I25708 Atherosclerosis of coronary artery bypass graft(s), unspecified, with other forms of angina pectoris: Secondary | ICD-10-CM | POA: Diagnosis not present

## 2016-06-08 DIAGNOSIS — E119 Type 2 diabetes mellitus without complications: Secondary | ICD-10-CM

## 2016-06-08 DIAGNOSIS — L8990 Pressure ulcer of unspecified site, unspecified stage: Secondary | ICD-10-CM | POA: Insufficient documentation

## 2016-06-08 HISTORY — DX: Chronic kidney disease, stage 3 (moderate): N18.3

## 2016-06-08 HISTORY — DX: Chronic kidney disease, stage 3 unspecified: N18.30

## 2016-06-08 HISTORY — DX: Other persistent atrial fibrillation: I48.19

## 2016-06-08 HISTORY — DX: Chronic obstructive pulmonary disease, unspecified: J44.9

## 2016-06-08 HISTORY — DX: Chronic diastolic (congestive) heart failure: I50.32

## 2016-06-08 LAB — BASIC METABOLIC PANEL
Anion gap: 10 (ref 5–15)
BUN: 24 mg/dL — ABNORMAL HIGH (ref 6–20)
CHLORIDE: 102 mmol/L (ref 101–111)
CO2: 29 mmol/L (ref 22–32)
Calcium: 10 mg/dL (ref 8.9–10.3)
Creatinine, Ser: 1.44 mg/dL — ABNORMAL HIGH (ref 0.61–1.24)
GFR calc non Af Amer: 44 mL/min — ABNORMAL LOW (ref >60)
GFR, EST AFRICAN AMERICAN: 51 mL/min — AB (ref >60)
GLUCOSE: 139 mg/dL — AB (ref 65–99)
Potassium: 4.5 mmol/L (ref 3.5–5.1)
Sodium: 141 mmol/L (ref 135–145)

## 2016-06-08 LAB — HEPATIC FUNCTION PANEL
ALK PHOS: 171 U/L — AB (ref 38–126)
ALT: 19 U/L (ref 17–63)
AST: 15 U/L (ref 15–41)
Albumin: 3.1 g/dL — ABNORMAL LOW (ref 3.5–5.0)
BILIRUBIN INDIRECT: 0.6 mg/dL (ref 0.3–0.9)
BILIRUBIN TOTAL: 0.7 mg/dL (ref 0.3–1.2)
Bilirubin, Direct: 0.1 mg/dL (ref 0.1–0.5)
TOTAL PROTEIN: 7.1 g/dL (ref 6.5–8.1)

## 2016-06-08 LAB — CBC WITH DIFFERENTIAL/PLATELET
Basophils Absolute: 0 10*3/uL (ref 0.0–0.1)
Basophils Relative: 0 %
Eosinophils Absolute: 0.2 10*3/uL (ref 0.0–0.7)
Eosinophils Relative: 1 %
HEMATOCRIT: 35.8 % — AB (ref 39.0–52.0)
HEMOGLOBIN: 11.5 g/dL — AB (ref 13.0–17.0)
LYMPHS ABS: 0.9 10*3/uL (ref 0.7–4.0)
LYMPHS PCT: 7 %
MCH: 28.2 pg (ref 26.0–34.0)
MCHC: 32.1 g/dL (ref 30.0–36.0)
MCV: 87.7 fL (ref 78.0–100.0)
MONOS PCT: 9 %
Monocytes Absolute: 1.2 10*3/uL — ABNORMAL HIGH (ref 0.1–1.0)
NEUTROS ABS: 11 10*3/uL — AB (ref 1.7–7.7)
NEUTROS PCT: 83 %
Platelets: 323 10*3/uL (ref 150–400)
RBC: 4.08 MIL/uL — ABNORMAL LOW (ref 4.22–5.81)
RDW: 14.4 % (ref 11.5–15.5)
WBC: 13.2 10*3/uL — ABNORMAL HIGH (ref 4.0–10.5)

## 2016-06-08 LAB — TROPONIN I
Troponin I: <0.03 ng/mL (ref <0.03)
Troponin I: <0.03 ng/mL (ref <0.03)

## 2016-06-08 LAB — BRAIN NATRIURETIC PEPTIDE: B Natriuretic Peptide: 1759 pg/mL — ABNORMAL HIGH (ref 0.0–100.0)

## 2016-06-08 LAB — GLUCOSE, CAPILLARY
Glucose-Capillary: 226 mg/dL — ABNORMAL HIGH (ref 65–99)
Glucose-Capillary: 233 mg/dL — ABNORMAL HIGH (ref 65–99)

## 2016-06-08 MED ORDER — INSULIN ASPART 100 UNIT/ML ~~LOC~~ SOLN
0.0000 [IU] | Freq: Three times a day (TID) | SUBCUTANEOUS | Status: DC
  Administered 2016-06-08: 5 [IU] via SUBCUTANEOUS
  Administered 2016-06-09: 3 [IU] via SUBCUTANEOUS
  Administered 2016-06-09: 15 [IU] via SUBCUTANEOUS
  Administered 2016-06-10 (×2): 3 [IU] via SUBCUTANEOUS
  Administered 2016-06-10: 5 [IU] via SUBCUTANEOUS
  Administered 2016-06-11: 2 [IU] via SUBCUTANEOUS
  Administered 2016-06-11: 5 [IU] via SUBCUTANEOUS
  Administered 2016-06-11: 8 [IU] via SUBCUTANEOUS
  Administered 2016-06-12: 3 [IU] via SUBCUTANEOUS
  Administered 2016-06-12 – 2016-06-13 (×3): 11 [IU] via SUBCUTANEOUS

## 2016-06-08 MED ORDER — ACETAMINOPHEN 325 MG PO TABS
650.0000 mg | ORAL_TABLET | Freq: Four times a day (QID) | ORAL | Status: DC | PRN
  Filled 2016-06-08: qty 2

## 2016-06-08 MED ORDER — INSULIN ASPART 100 UNIT/ML ~~LOC~~ SOLN
0.0000 [IU] | Freq: Every day | SUBCUTANEOUS | Status: DC
  Administered 2016-06-08 – 2016-06-10 (×2): 2 [IU] via SUBCUTANEOUS
  Administered 2016-06-11: 3 [IU] via SUBCUTANEOUS
  Administered 2016-06-12: 5 [IU] via SUBCUTANEOUS

## 2016-06-08 MED ORDER — APIXABAN 5 MG PO TABS
5.0000 mg | ORAL_TABLET | Freq: Two times a day (BID) | ORAL | Status: DC
  Administered 2016-06-08 (×2): 5 mg via ORAL
  Filled 2016-06-08 (×2): qty 1

## 2016-06-08 MED ORDER — SODIUM CHLORIDE 0.9 % IV SOLN
250.0000 mL | INTRAVENOUS | Status: DC | PRN
  Administered 2016-06-08: 250 mL via INTRAVENOUS

## 2016-06-08 MED ORDER — FUROSEMIDE 10 MG/ML IJ SOLN
40.0000 mg | Freq: Once | INTRAMUSCULAR | Status: AC
  Administered 2016-06-08: 40 mg via INTRAVENOUS
  Filled 2016-06-08: qty 4

## 2016-06-08 MED ORDER — PIPERACILLIN-TAZOBACTAM 3.375 G IVPB 30 MIN
3.3750 g | Freq: Once | INTRAVENOUS | Status: AC
Start: 2016-06-08 — End: 2016-06-08
  Administered 2016-06-08: 3.375 g via INTRAVENOUS
  Filled 2016-06-08: qty 50

## 2016-06-08 MED ORDER — PREDNISOLONE ACETATE 1 % OP SUSP
1.0000 [drp] | Freq: Four times a day (QID) | OPHTHALMIC | Status: DC
  Administered 2016-06-08 – 2016-06-15 (×26): 1 [drp] via OPHTHALMIC
  Filled 2016-06-08 (×2): qty 1

## 2016-06-08 MED ORDER — INSULIN GLARGINE 100 UNIT/ML ~~LOC~~ SOLN
30.0000 [IU] | Freq: Every day | SUBCUTANEOUS | Status: DC
  Administered 2016-06-08 – 2016-06-12 (×5): 30 [IU] via SUBCUTANEOUS
  Filled 2016-06-08 (×7): qty 0.3

## 2016-06-08 MED ORDER — DEXTROSE 5 % IV SOLN
1.0000 g | INTRAVENOUS | Status: AC
  Administered 2016-06-08 – 2016-06-14 (×7): 1 g via INTRAVENOUS
  Filled 2016-06-08 (×7): qty 10

## 2016-06-08 MED ORDER — OXYCODONE HCL 5 MG PO TABS
15.0000 mg | ORAL_TABLET | Freq: Four times a day (QID) | ORAL | Status: DC | PRN
  Administered 2016-06-08 – 2016-06-13 (×4): 15 mg via ORAL
  Filled 2016-06-08 (×4): qty 3

## 2016-06-08 MED ORDER — ROSUVASTATIN CALCIUM 10 MG PO TABS
10.0000 mg | ORAL_TABLET | Freq: Every day | ORAL | Status: DC
  Administered 2016-06-08 – 2016-06-15 (×8): 10 mg via ORAL
  Filled 2016-06-08 (×8): qty 1

## 2016-06-08 MED ORDER — ACETAMINOPHEN 650 MG RE SUPP: 650.0000 mg | Freq: Four times a day (QID) | RECTAL | Status: DC | PRN

## 2016-06-08 MED ORDER — VANCOMYCIN HCL IN DEXTROSE 1-5 GM/200ML-% IV SOLN
1000.0000 mg | Freq: Once | INTRAVENOUS | Status: AC
  Administered 2016-06-08: 1000 mg via INTRAVENOUS
  Filled 2016-06-08: qty 200

## 2016-06-08 MED ORDER — METOPROLOL SUCCINATE ER 50 MG PO TB24
50.0000 mg | ORAL_TABLET | Freq: Every day | ORAL | Status: DC
  Administered 2016-06-08 – 2016-06-09 (×2): 50 mg via ORAL
  Filled 2016-06-08 (×2): qty 1

## 2016-06-08 MED ORDER — DEXTROSE 5 % IV SOLN
500.0000 mg | INTRAVENOUS | Status: AC
  Administered 2016-06-08 – 2016-06-14 (×7): 500 mg via INTRAVENOUS
  Filled 2016-06-08 (×7): qty 500

## 2016-06-08 MED ORDER — SODIUM CHLORIDE 0.9% FLUSH
3.0000 mL | INTRAVENOUS | Status: DC | PRN
  Administered 2016-06-08: 3 mL via INTRAVENOUS
  Filled 2016-06-08: qty 3

## 2016-06-08 MED ORDER — SODIUM CHLORIDE 0.9% FLUSH
3.0000 mL | Freq: Two times a day (BID) | INTRAVENOUS | Status: DC
  Administered 2016-06-08 – 2016-06-14 (×12): 3 mL via INTRAVENOUS

## 2016-06-08 MED ORDER — POTASSIUM CHLORIDE CRYS ER 10 MEQ PO TBCR
10.0000 meq | EXTENDED_RELEASE_TABLET | Freq: Every day | ORAL | Status: DC
  Administered 2016-06-09 – 2016-06-12 (×4): 10 meq via ORAL
  Filled 2016-06-08 (×5): qty 1

## 2016-06-08 NOTE — H&P (Signed)
History and Physical    [name] WCH:[id number] DOB: 11/16/1931 DOA: 06/08/2016  PCP: [name], MD  Patient coming from:  Home  Chief Complaint: Shortness of breath  HPI: [name] is a 83 y.o. male with medical history significant of diastolic congestive heart failure, CAD, CKD (Stage III), Atrial fibrillation, Diabetes Mellitus and HTN presents to the ED from his physician office after going to be seen for dyspnea.  Per patient he has had shortness of breath for about 2 months however, he reports his shortness of breath really began prior to his last hospitalization which as in March of 2018.  He reports that he has had an accompanying cough during this time as well.  Both of these symptoms never improved.  Per patient he cannot walk more than 10 yards without getting short of breath.  His cough is productive of yellow sputum occasionally.  Says the cough seems to be more productive at night.  Cannot lay down flat on his back but can lay down on his side flat.  Says he occasionally has chest pain that comes and goes and has been going on for a couple of months.  No vision changes but having weakness.  After last hospitalization did not follow up with cardiology.    ED Course: Patient found to have BNP of 1759, troponin of <0.03, cr of 1.44, WBC slightly elevated at 13.2.  Cardiology consulted by EDP and stated patient could stay at [facility] for further evaluation.  Blood cultures were collected.  CXR showing similar small left pleural effusion with adjacent airspace disease. This could represent chronic atelectasis. Pneumonia cannot be excluded. Cardiomegaly with mild pulmonary venous congestion  Review of Systems: As per HPI otherwise 10 point review of systems negative.    Past Medical History:  Diagnosis Date  . BPH (benign prostatic hyperplasia)   . CAD (coronary artery disease)    Multivessel status post CABG 2011 - LIMA to LAD, SVG to diagonal, SVG to OM, SVG to PDA  .  Cellulitis    12/15  . Chronic back pain   . Chronic diastolic CHF (congestive heart failure) ([place])   . CKD (chronic kidney disease), stage III   . COPD (chronic obstructive pulmonary disease) ([place])   . Essential hypertension   . Gastric mass    EGD 9/15  . GERD (gastroesophageal reflux disease)   . History of DVT (deep vein thrombosis)    Postphlebitic syndrome  . History of kidney stones   . HOH (hard of hearing)   . Hx of CABG   . Hyperlipidemia   . Persistent atrial fibrillation ([place])    a. s/p DCCV 03/2016.  [name] Sleep apnea    Stop Bang score of 5  . Type 2 diabetes mellitus ([place])     Past Surgical History:  Procedure Laterality Date  . CARDIOVERSION N/A 03/20/2016   Procedure: CARDIOVERSION;  Surgeon: Arnoldo Lenis, MD;  Location: [facility];  Service: Endoscopy;  Laterality: N/A;  . CATARACT EXTRACTION W/PHACO Left 02/07/2016   Procedure: CATARACT EXTRACTION PHACO AND INTRAOCULAR LENS PLACEMENT (IOC);  Surgeon: [name], MD;  Location: [facility] OR[facility];  Service: Ophthalmology;  Laterality: Left;  CDE:  23.13  . COLONOSCOPY  2004   Dr. Laural Golden: three small polyps at cecum, path unknown, external hemorrhoids  . COLONOSCOPY N/A 08/16/2013   Dr. [name]: incomplete prep. multiple tubular adenomas, multiple biopsies. Needs surveillance in Aug 2016 due to poor prep  . CORONARY ARTERY BYPASS  GRAFT     x5  . CORONARY ARTERY BYPASS GRAFT  2011  . CYSTOSCOPY N/A 12/12/2012   Procedure: CYSTOSCOPY FLEXIBLE;  Surgeon: Marissa Nestle, MD;  Location: [facility];  Service: Urology;  Laterality: N/A;  . ESOPHAGOGASTRODUODENOSCOPY N/A 08/16/2013   Dr. [name]: normal esophagus s/p Maloney dilation, gastric erosions, submucosal gastric mass vs extrinsic mass  . EUS N/A 09/07/2013   Dr. 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 INCISION AND DRAINAGE ABSCESS N/A 12/20/2013   Procedure: INCISION AND DRAINAGE ABSCESS NECK;  Surgeon: Jamesetta So, MD;  Location: [facility];  Service:  General;  Laterality: N/A;  . Venia Minks DILATION N/A 08/16/2013   Procedure: Keturah Shavers;  Surgeon: Daneil Dolin, MD;  Location: [facility];  Service: Endoscopy;  Laterality: N/A;     reports that he quit smoking about 60 years ago. He has a 11.00 pack-year smoking history. He has never used smokeless tobacco. He reports that he does not drink alcohol or use drugs.  No Known Allergies  Family History  Problem Relation Age of Onset  . Colon cancer Son 68       deceased     Prior to Admission medications   Medication Sig Start Date End Date Taking? Authorizing Provider  amLODipine-olmesartan (AZOR) 5-40 MG tablet Take 1 tablet by mouth daily.    [provider]  apixaban (ELIQUIS) 5 MG TABS tablet Take 5 mg by mouth 2 (two) times daily.    [provider]  Besifloxacin HCl (BESIVANCE) 0.6 % SUSP Place 1 drop into the left eye 2 (two) times daily.     [provider]  budesonide-formoterol (SYMBICORT) 160-4.5 MCG/ACT inhaler Inhale 2 puffs into the lungs 2 (two) times daily.    [provider]  Cholecalciferol (VITAMIN D3) 400 units tablet Take 400 Units by mouth daily.    [provider]  cloNIDine (CATAPRES) 0.1 MG tablet Take 0.1 mg by mouth 2 (two) times daily.    [provider]  dextromethorphan-guaiFENesin (MUCINEX DM) 30-600 MG 12hr tablet Take 1 tablet by mouth 2 (two) times daily.    [provider]  furosemide (LASIX) 20 MG tablet Take 20 mg by mouth 2 (two) times daily.    [provider]  Insulin Glargine (LANTUS SOLOSTAR) 100 UNIT/ML Solostar Pen Inject 45 Units into the skin daily at 6 PM.     [provider]  insulin lispro (HUMALOG) 100 UNIT/ML injection Inject into the skin. Per sliding scale    [provider]  ipratropium-albuterol (DUONEB) 0.5-2.5 (3) MG/3ML SOLN Take 3 mLs by nebulization every 6 (six) hours as needed (wheezing).    [provider]  levofloxacin  (LEVAQUIN) 500 MG tablet Take 1 tablet (500 mg total) by mouth daily. 03/21/16   [name], MD  metoprolol succinate (TOPROL-XL) 50 MG 24 hr tablet Take 50 mg by mouth daily. Take with or immediately following a meal.    [provider]  nepafenac (ILEVRO) 0.3 % ophthalmic suspension Place 1 drop into the left eye daily.    [provider]  nitroGLYCERIN (NITROSTAT) 0.4 MG SL tablet Place 1 tablet (0.4 mg total) under the tongue every 5 (five) minutes as needed for chest pain. 12/22/13   [name], MD  Omega-3 Fatty Acids (OMEGA 3 PO) Take 1 capsule by mouth daily.    [provider]  oxymetazoline (AFRIN) 0.05 % nasal spray Place 1 spray into both nostrils 2 (  two) times daily.    [provider]  pantoprazole (PROTONIX) 40 MG tablet Take 40 mg by mouth daily.    [provider]  potassium chloride (KLOR-CON) 8 MEQ tablet Take 1 tablet (8 mEq total) by mouth daily. 03/21/16   [name], MD  rosuvastatin (CRESTOR) 10 MG tablet Take 10 mg by mouth daily.    [provider]  silodosin (RAPAFLO) 8 MG CAPS capsule Take 8 mg by mouth daily with breakfast.    [provider]  timolol (BETIMOL) 0.5 % ophthalmic solution Place 1 drop into the left eye daily.    [provider]    Physical Exam: Vitals:   06/08/16 1134 06/08/16 1149 06/08/16 1200 06/08/16 1230  BP:  129/72 110/69 117/63  Pulse: 80 78 98 79  Resp: 16 (!) 22 13 13   Temp:      TempSrc:      SpO2: 95% 97% 94% 95%  Weight:      Height:          Constitutional: NAD, calm, comfortable Vitals:   06/08/16 1134 06/08/16 1149 06/08/16 1200 06/08/16 1230  BP:  129/72 110/69 117/63  Pulse: 80 78 98 79  Resp: 16 (!) 22 13 13   Temp:      TempSrc:      SpO2: 95% 97% 94% 95%  Weight:      Height:       Eyes: PERRL, lids and conjunctivae normal ENMT: Mucous membranes are moist. Posterior pharynx clear of any exudate or lesions.Normal dentition.   Neck: normal, supple, no masses, no thyromegaly Respiratory: rales in bases bilaterally, scattered rhonchi throughout, poor air movement.  Cardiovascular: irregular irregular, no murmurs / rubs / gallops. Trace pretibial extremity edema. 2+ pedal pulses. No carotid bruits.  Abdomen: obese, no tenderness, no masses palpated. No hepatosplenomegaly. Bowel sounds positive.  Musculoskeletal: Good ROM, no contractures. Unable to visually inspect lower extremities from hips to knees. Normal muscle tone.  Skin: numerous lesions on back of scalp, back and lower abdomen that are healing Neurologic: CN 2-12 grossly intact. Sensation intact, DTR normal. Strength 5/5 in all 4.  Psychiatric: Alert and oriented x 3. Normal mood.  Poor insight    Labs on Admission: I have personally reviewed following labs and imaging studies  CBC:  Recent Labs Lab 06/08/16 1037  WBC 13.2*  NEUTROABS 11.0*  HGB 11.5*  HCT 35.8*  MCV 87.7  PLT 932   Basic Metabolic Panel:  Recent Labs Lab 06/08/16 1037  NA 141  K 4.5  CL 102  CO2 29  GLUCOSE 139*  BUN 24*  CREATININE 1.44*  CALCIUM 10.0   GFR: Estimated Creatinine Clearance: 47.3 mL/min (A) (by C-G formula based on SCr of 1.44 mg/dL (H)). Liver Function Tests:  Recent Labs Lab 06/08/16 1133  AST 15  ALT 19  ALKPHOS 171*  BILITOT 0.7  PROT 7.1  ALBUMIN 3.1*   No results for input(s): LIPASE, AMYLASE in the last 168 hours. No results for input(s): AMMONIA in the last 168 hours. Coagulation Profile: No results for input(s): INR, PROTIME in the last 168 hours. Cardiac Enzymes:  Recent Labs Lab 06/08/16 1037  TROPONINI <0.03   BNP (last 3 results) No results for input(s): PROBNP in the last 8760 hours. HbA1C: No results for input(s): HGBA1C in the last 72 hours. CBG: No results for input(s): GLUCAP in the last 168 hours. Lipid Profile: No results for input(s): CHOL, HDL, LDLCALC, TRIG, CHOLHDL, LDLDIRECT in the last  72  hours. Thyroid Function Tests: No results for input(s): TSH, T4TOTAL, FREET4, T3FREE, THYROIDAB in the last 72 hours. Anemia Panel: No results for input(s): VITAMINB12, FOLATE, FERRITIN, TIBC, IRON, RETICCTPCT in the last 72 hours. Urine analysis:    Component Value Date/Time   COLORURINE YELLOW 12/19/2013 [place] 12/19/2013 1905   LABSPEC >1.030 (H) 12/19/2013 1905   PHURINE 5.0 12/19/2013 1905   GLUCOSEU NEGATIVE 12/19/2013 1905   HGBUR NEGATIVE 12/19/2013 1905   BILIRUBINUR NEGATIVE 12/19/2013 1905   KETONESUR NEGATIVE 12/19/2013 1905   PROTEINUR 100 (A) 12/19/2013 1905   UROBILINOGEN 0.2 12/19/2013 1905   NITRITE NEGATIVE 12/19/2013 1905   LEUKOCYTESUR NEGATIVE 12/19/2013 1905   Sepsis Labs: !!!!!!!!!!!!!!!!!!!!!!!!!!!!!!!!!!!!!!!!!!!! @LABRCNTIP (procalcitonin:4,lacticidven:4) ) Recent Results (from the past 240 hour(s))  Blood culture (routine x 2)     Status: None (Preliminary result)   Collection Time: 06/08/16 12:37 PM  Result Value Ref Range Status   Specimen Description LEFT ANTECUBITAL  Final   Special Requests   Final    BOTTLES DRAWN AEROBIC AND ANAEROBIC Blood Culture adequate volume   Culture PENDING  Incomplete   Report Status PENDING  Incomplete  Blood culture (routine x 2)     Status: None (Preliminary result)   Collection Time: 06/08/16  1:04 PM  Result Value Ref Range Status   Specimen Description RIGHT ANTECUBITAL  Final   Special Requests   Final    BOTTLES DRAWN AEROBIC AND ANAEROBIC Blood Culture adequate volume   Culture PENDING  Incomplete   Report Status PENDING  Incomplete     Radiological Exams on Admission: Dg Chest 2 View  Result Date: 06/08/2016 CLINICAL DATA:  chest pain x 3 weeks, sob, and chronic cough. Copd/diabetic/hx bypass surgery/ex smoker EXAM: CHEST  2 VIEW COMPARISON:  03/19/2016 FINDINGS: Midline trachea. Mild cardiomegaly with transverse aortic atherosclerosis. Prior median sternotomy. Small left pleural  effusion is not significantly changed. No pneumothorax. Mild pulmonary venous congestion. Similar patchy left base airspace disease, new since 01/10/2015. Probable bilateral nipple shadows. IMPRESSION: Since 03/19/2016, similar small left pleural effusion with adjacent airspace disease. This could represent chronic atelectasis. Pneumonia cannot be excluded. Cardiomegaly with mild pulmonary venous congestion. Probable bilateral nipple shadows. Repeat frontal radiograph with nipple markers could confirm. Electronically Signed   By: Abigail Miyamoto M.D.   On: 06/08/2016 10:25    EKG: Not done  Assessment/Plan Active Problems:   Dyspnea   Essential hypertension   Diabetes (HCC)   Acute on chronic diastolic CHF (congestive heart failure) (HCC)   CAD in native artery   Chest pain   Persistent atrial fibrillation (HCC)   CKD (chronic kidney disease), stage III     Dyspnea - cardiology consulted - Echocardiogram ordered - lasix 40mg  IV started - oxygen as needed - agree that CT would be beneficial but patient reports he cannot lay down flat on his back for CT at this time - may need to pursue CT tomorrow after initial diuresis  Chest pain - waxing and waning - cycle troponins - cardiology consulted - etiology likely multifactorial  Essential HTN - lasix - can restart azor tomorrow  DM - limit lantus to 30 units daily - carb modified diet - SSI   Atrial fibrillation - continue eliquis - continue toprol for rate control  CKD Stage III - repeat BMP in am - monitor Cr with diuresis   DVT prophylaxis: Eliquis  Code Status: Full code  Family Communication: No family bedside  Disposition Plan: Unclear, pending  clinical course Consults called:  Cardiology Admission status:  Inpatient, tele   Loretha Stapler MD [facility] Pager [phone]  If 7PM-7AM, please contact night-coverage [email or web address] Password [email or web address]  06/08/2016, 1:34 PM

## 2016-06-08 NOTE — Consult Note (Signed)
Cardiology Consultation:   Patient ID: [name]; [id number]; 09-08-1931   Admit date: 06/08/2016 Date of Consult: 06/08/2016  Primary Care Provider: [name], MD Primary Cardiologist: Dr. Domenic Polite   Patient Profile:   [name] is an 83 y.o. male with a hx of CAD s/p prior PCIs then CABG 2011 (LIMA to LAD, SVG to diagonal, SVG to OM, SVG to PDA), chronic diastolic CHF, CKD III, BPH, chronic back pain, HTN, GERD, prior DVT with postphlebitic syndrome, hard of hearing, sleep apnea, HLD, DM, persistent atrial fib s/p DCCV 03/2016 whom we are asked to see for CP/SOB by Dr. Roderic Palau.  History of Present Illness:   Last nuc 02/2016 showed no ischemia or scar, EF 54%. Last admission 03/2016 for worsening SOB in setting of persistent AF with ultimate DCCV to NSR on Eliquis. He was also treated with Levaquin for possible PNA. He did not follow up after that admission.  He returns to the ED today with complaints of 2 months worth of progress dyspnea, progressively worsening and now with even minimal activity. His activity is also limited by significant chronic back pain. He's not had any orthopnea or noticed any palpitations, but is back in atrial fib of unknown duration. He reports spotty med compliance, occasionally misses doses but does not really give a reason. He denies any bleeding. He reports yellow sputum cough for several weeks. He also reports daily chest pain, sometimes on and off all day long, occasionally worse with exertion but not every time. Not worse with palpation, inspiration. Due to worsening dyspnea, he came to the ED today. Labs notable for BUN/Cr 24/1.44, albumin 3.1, AP 171, BNP 1759, troponin neg, WBC 13k, Hgb 11.5. CXR similar small left pleural effusion, chronic atx, PNA cannot be excluded, mild pulm vasc congestion, probable nipple shadows. VSS - HR 80s-100. Not clear currently what medications he's actually been taking at home.  Past Medical History:  Diagnosis  Date  . BPH (benign prostatic hyperplasia)   . CAD (coronary artery disease)    Multivessel status post CABG 2011 - LIMA to LAD, SVG to diagonal, SVG to OM, SVG to PDA  . Cellulitis    12/15  . Chronic back pain   . Chronic diastolic CHF (congestive heart failure) ([place])   . CKD (chronic kidney disease), stage III   . COPD (chronic obstructive pulmonary disease) ([place])   . Essential hypertension   . Gastric mass    EGD 9/15  . GERD (gastroesophageal reflux disease)   . History of DVT (deep vein thrombosis)    Postphlebitic syndrome  . History of kidney stones   . HOH (hard of hearing)   . Hx of CABG   . Hyperlipidemia   . Persistent atrial fibrillation ([place])    a. s/p DCCV 03/2016.  [name] Sleep apnea    Stop Bang score of 5  . Type 2 diabetes mellitus ([place])     Past Surgical History:  Procedure Laterality Date  . CARDIOVERSION N/A 03/20/2016   Procedure: CARDIOVERSION;  Surgeon: Arnoldo Lenis, MD;  Location: [facility];  Service: Endoscopy;  Laterality: N/A;  . CATARACT EXTRACTION W/PHACO Left 02/07/2016   Procedure: CATARACT EXTRACTION PHACO AND INTRAOCULAR LENS PLACEMENT (IOC);  Surgeon: [name], MD;  Location: [facility];  Service: Ophthalmology;  Laterality: Left;  CDE:  23.13  . COLONOSCOPY  2004   Dr. Laural Golden: three small polyps at cecum, path unknown, external hemorrhoids  . COLONOSCOPY N/A 08/16/2013   Dr.  Rourk: incomplete prep. multiple tubular adenomas, multiple biopsies. Needs surveillance in Aug 2016 due to poor prep  . CORONARY ARTERY BYPASS GRAFT     x5  . CORONARY ARTERY BYPASS GRAFT  2011  . CYSTOSCOPY N/A 12/12/2012   Procedure: CYSTOSCOPY FLEXIBLE;  Surgeon: Marissa Nestle, MD;  Location: [facility];  Service: Urology;  Laterality: N/A;  . ESOPHAGOGASTRODUODENOSCOPY N/A 08/16/2013   Dr. [name]: normal esophagus s/p Maloney dilation, gastric erosions, submucosal gastric mass vs extrinsic mass  . EUS N/A 09/07/2013   Dr. Ardis Hughs: likely benign gastric lipoma, needs  CT in Sept 2016  . INCISION AND DRAINAGE ABSCESS N/A 12/20/2013   Procedure: INCISION AND DRAINAGE ABSCESS NECK;  Surgeon: Jamesetta So, MD;  Location: [facility];  Service: General;  Laterality: N/A;  . Venia Minks DILATION N/A 08/16/2013   Procedure: Keturah Shavers;  Surgeon: Daneil Dolin, MD;  Location: [facility];  Service: Endoscopy;  Laterality: N/A;     Inpatient Medications: Scheduled Meds:  Continuous Infusions: . piperacillin-tazobactam 3.375 g (06/08/16 1238)  . vancomycin     PRN Meds:   Allergies:   No Known Allergies  Social History:   Social History   Social History  . Marital status: Single    Spouse name: N/A  . Number of children: N/A  . Years of education: N/A   Occupational History  . Not on file.   Social History Main Topics  . Smoking status: Former Smoker    Packs/day: 1.00    Years: 11.00    Quit date: 01/05/1956  . Smokeless tobacco: Never Used  . Alcohol use No  . Drug use: No  . Sexual activity: Not on file   Other Topics Concern  . Not on file   Social History Narrative   ** Merged History Encounter **        Family History:   The patient's family history includes Colon cancer (age of onset: 50) in his son.  ROS:  Please see the history of present illness.  + wheezing. All other ROS reviewed and negative.     Physical Exam/Data:   Vitals:   06/08/16 1134 06/08/16 1149 06/08/16 1200 06/08/16 1230  BP:  129/72 110/69 117/63  Pulse: 80 78 98 79  Resp: 16 (!) 22 13 13   Temp:      TempSrc:      SpO2: 95% 97% 94% 95%  Weight:      Height:       No intake or output data in the 24 hours ending 06/08/16 1249 Filed Weights   06/08/16 0958  Weight: 225 lb (102.1 kg)   Body mass index is 32.28 kg/m.  General: Well developed, well nourished WM in no acute distress. Head: Normocephalic, atraumatic, sclera non-icteric, no xanthomas, nares are without discharge. Neck: Negative for carotid bruits. JVD not elevated. Lungs:  Diffusely diminished BS, occ exp wheezing, no obvious rales or rhonchi. Breathing is unlabored. Heart: Irregularly irregular, rate generally controlled, with S1 S2. No murmurs, rubs, or gallops appreciated. Abdomen: Soft but somewhat distended with normoactive bowel sounds. No rebound/guarding. Msk:  Strength and tone appear normal for age. Extremities: No clubbing or cyanosis. Trace BLE edema. No erythema or palpable cords. Distal pedal pulses are 2+ and equal bilaterally. Neuro: Alert and oriented X 3. No facial asymmetry. No focal deficit. Moves all extremities spontaneously. Psych:  Responds to questions appropriately with a normal affect.  EKG:  The EKG was personally reviewed and demonstrates atrial fib 83bpm  occ PVCs, nonspecific TW changes  Relevant CV Studies: 2D Echo 02/18/16 - Left ventricle: The cavity size was normal. Wall thickness was   increased in a pattern of moderate to severe LVH. Systolic   function was normal. The estimated ejection fraction was in the   range of 60% to 65%. Wall motion was normal; there were no   regional wall motion abnormalities. The study was not technically   sufficient to allow evaluation of LV diastolic dysfunction due to   atrial fibrillation. - Aortic valve: Mildly calcified annulus. Trileaflet; mildly   thickened leaflets. Mean gradient (S): 4 mm Hg. Valve area (VTI):   2.1 cm^2. Valve area (Vmax): 1.96 cm^2. - Mitral valve: Mildly calcified annulus. Mildly thickened leaflets   . There was mild regurgitation. - Left atrium: The atrium was mildly dilated. - Right atrium: The atrium was mildly dilated. - Technically difficult study. Echocontrast was used to enhance   visualization.  Laboratory Data:  Chemistry Recent Labs Lab 06/08/16 1037  NA 141  K 4.5  CL 102  CO2 29  GLUCOSE 139*  BUN 24*  CREATININE 1.44*  CALCIUM 10.0  GFRNONAA 44*  GFRAA 51*  ANIONGAP 10     Recent Labs Lab 06/08/16 1133  PROT 7.1  ALBUMIN 3.1*    AST 15  ALT 19  ALKPHOS 171*  BILITOT 0.7   Hematology Recent Labs Lab 06/08/16 1037  WBC 13.2*  RBC 4.08*  HGB 11.5*  HCT 35.8*  MCV 87.7  MCH 28.2  MCHC 32.1  RDW 14.4  PLT 323   Cardiac Enzymes Recent Labs Lab 06/08/16 1037  TROPONINI <0.03   No results for input(s): TROPIPOC in the last 168 hours.  BNP Recent Labs Lab 06/08/16 1133  BNP 1,759.0*    Radiology/Studies:  Dg Chest 2 View  Result Date: 06/08/2016 CLINICAL DATA:  chest pain x 3 weeks, sob, and chronic cough. Copd/diabetic/hx bypass surgery/ex smoker EXAM: CHEST  2 VIEW COMPARISON:  03/19/2016 FINDINGS: Midline trachea. Mild cardiomegaly with transverse aortic atherosclerosis. Prior median sternotomy. Small left pleural effusion is not significantly changed. No pneumothorax. Mild pulmonary venous congestion. Similar patchy left base airspace disease, new since 01/10/2015. Probable bilateral nipple shadows. IMPRESSION: Since 03/19/2016, similar small left pleural effusion with adjacent airspace disease. This could represent chronic atelectasis. Pneumonia cannot be excluded. Cardiomegaly with mild pulmonary venous congestion. Probable bilateral nipple shadows. Repeat frontal radiograph with nipple markers could confirm. Electronically Signed   By: Abigail Miyamoto M.D.   On: 06/08/2016 10:25   Mr Lumbar Spine Wo Contrast  Result Date: 06/05/2016 CLINICAL DATA:  Severe chronic right-sided low back pain. EXAM: MRI LUMBAR SPINE WITHOUT CONTRAST TECHNIQUE: Multiplanar, multisequence MR imaging of the lumbar spine was performed. No intravenous contrast was administered. COMPARISON:  MRI DATED 01/23/2011 FINDINGS: Segmentation:  Standard. Alignment:  Physiologic. Vertebrae:  No fracture, evidence of discitis, or bone lesion. Conus medullaris: Extends to the L1-2 level and appears normal. Paraspinal and other soft tissues: Negative. Disc levels: L1-2 AND L2-3:  Slight disc desiccation.  Otherwise normal. L3-4: Tiny broad-based  disc bulge with a small protrusion into the right neural foramen without neural impingement. This is slightly more prominent than the disc bulge present on the prior study. L4-5: Soft disc protrusion into the right neural foramen adjacent 2 but not compressing the right L4 nerve. This could irritate the nerve, however. The finding is more prominent than on the prior study. Small disc bulge into the left neural foramen. Slight hypertrophy  of the ligamentum flavum without neural impingement. L5-S1: Small disc protrusion into the right neural foramen adjacent to the right L5 nerve. Hypertrophy of the right ligamentum flavum creates moderate right foraminal stenosis. This could irritate the right L5 nerve. This is more prominent on the prior study. Small disc bulge into the left neural foramen with moderate left foraminal stenosis which could affect the left L5 nerve. Slight hypertrophy of the left facet joint. IMPRESSION: Progressive degenerative disc disease at L3-4 through L5-S1 with new small disc protrusions into the right neural foramina at L3-4, L4-5, and L5-S1. These could possibly irritate the exiting nerve roots. Electronically Signed   By: Lorriane Shire M.D.   On: 06/05/2016 11:19    Assessment and Plan:   1. Shortness of breath, possibly multifactorial - pending IM admission for question of recurrent HCAP - elevated WBC, possible PNA on CXR, yellow sputum cough. May need to consider CT if truly suspected to be unresolved or recurrent HCAP to exclude underlying obstructing lesion. Also suspect contribution from a/c diastolic CHF in the setting of recurrent AF and poor medication compliance.  2. Chest pain - x 1 month with mixed atypical/typical features - patient is somewhat of a poor historian. EKG nonspecific. Negative nuc 02/2016. Would recommend to cycle troponins for now. Follow symptoms with diuresis. Consider f/u echo. If LVEF is down may need to revisit ischemic eval.  3. Acute on chronic  diastolic CHF - patient reports often missing doses of meds which is likely contributing. Would start with Lasix 40mg  IV BID and follow I/Os, daily weights.  4. Recurrent atrial fibrillation - s/p DCCV 03/2016; unclear how long he's been back in atrial fib as he has not followed up with cardiology since. Would resume Eliquis if OK with IM (see #7) and follow. If dyspnea persists despite diuresis could consider TEE/DCCV. However, at this time it's not clear if he is back in atrial fib due to an acute infectious issue or if he would needs AAD long term. I'll review with Dr. Domenic Polite. Importance of compliance reinforced. CHADSVASC 6.  5. CAD s/p prior CABG - see above. Cycle troponins.  6. CKD stage III - follow with diuresis.  7. Anemia - per IM, ? dilutional. Pt denies any bleeding but hgb downtrending.  Signed, Charlie Pitter, PA-C  06/08/2016 12:49 PM    Attending note:  Patient seen and examined. Reviewed records and discussed the case with Ms. Purcell Mouton. He was last seen in March of this year and hospitalized with community-acquired pneumonia and persistent atrial fibrillation that was electively cardioverted. Additional cardiac history is discussed above. He is referred to the ER by Dr. Cindie Laroche today after presenting for office visit, complaining of progressive shortness of breath and exertional fatigue, intermittent chest discomfort, also coughing which is intermittently productive. No obvious fevers or chills. He has not been consistent with medication use, does use a nebulizer at times but states that he has not been as effective. He is noted to be back in atrial fibrillation, although heart rate is reasonably well controlled. Initial troponin I is less than 0.03, BNP elevated at 1759. Follow-up chest x-ray shows probable small left pleural effusion with associated infiltrate.  On examination the patient has mild wheezing, no active chest pain. Heart rate is in the 80s in atrial fibrillation by  telemetry. Lungs exhibit diminished breath sounds with end expiratory wheezes, cardiac exam with irregularly irregular rhythm, distant without gallop. He has 2+ leg edema. Lab work shows creatinine  1.4, hemoglobin 11.5, WBC 13.2.  Patient presents with several week history of dyspnea on exertion and fatigue, intermittent cough that is occasionally productive, recurrent atrial fibrillation of uncertain duration, also intermittent chest discomfort. Suspect that symptoms are multifactorial. He does have a mild leukocytosis and abnormal chest x-ray suggesting pneumonia, question whether he had complete resolution of prior episode and if CT imaging would be of benefit. He is being admitted to the hospitalist team. Would cycle a full set of cardiac markers. Initiate IV Lasix for diuresis with suspected component of acute on chronic diastolic heart failure. Continue Eliquis for stroke prophylaxis with atrial fibrillation and Toprol-XL for heart rate control. We will obtain a follow-up echocardiogram to ensure no decrease in LVEF. Not entirely clear that follow-up cardioversion of atrial fibrillation will be pursued at this time - might adopt a.strategy of heart rate control and anticoagulation.  Satira Sark, M.D., F.A.C.C.

## 2016-06-08 NOTE — ED Notes (Signed)
Dunn PA at bedside.

## 2016-06-08 NOTE — ED Provider Notes (Signed)
[facility] DEPT Provider Note   CSN: [id number] Arrival date & time: 06/08/16  0934     History   Chief Complaint Chief Complaint  Patient presents with  . Chest Pain    HPI [name] is a 83 y.o. male.  Patient complains of shortness of breath with exertion getting more often over last couple months. He also has been having chest pain with exertion   The history is provided by the patient.  Chest Pain   This is a recurrent problem. The current episode started more than 1 week ago. The problem occurs daily. The problem has been gradually worsening. The pain is associated with exertion. The pain is present in the substernal region. The pain is at a severity of 7/10. The pain is moderate. The quality of the pain is described as dull. The pain does not radiate. The symptoms are aggravated by exertion. Pertinent negatives include no abdominal pain, no back pain, no cough and no headaches.  Pertinent negatives for past medical history include no seizures.    Past Medical History:  Diagnosis Date  . BPH (benign prostatic hyperplasia)   . CAD (coronary artery disease)    Multivessel status post CABG 2011 - LIMA to LAD, SVG to diagonal, SVG to OM, SVG to PDA  . Cellulitis    12/15  . Chronic back pain   . Chronic diastolic CHF (congestive heart failure) ([place])   . CKD (chronic kidney disease), stage III   . COPD (chronic obstructive pulmonary disease) ([place])   . Essential hypertension   . Gastric mass    EGD 9/15  . GERD (gastroesophageal reflux disease)   . History of DVT (deep vein thrombosis)    Postphlebitic syndrome  . History of kidney stones   . HOH (hard of hearing)   . Hx of CABG   . Hyperlipidemia   . Persistent atrial fibrillation ([place])    a. s/p DCCV 03/2016.  [name] Sleep apnea    Stop Bang score of 5  . Type 2 diabetes mellitus [facility])     Patient Active Problem List   Diagnosis Date Noted  . Acute diastolic CHF (congestive heart failure) ([place]) 03/19/2016   . CAP (community acquired pneumonia) 03/19/2016  . Abnormal weight loss   . Cellulitis and abscess 12/18/2013  . Thrush, oral 12/18/2013  . Chest pain at rest 12/18/2013  . Leukocytosis 12/18/2013  . Diabetes ([place]) 12/18/2013  . Chronic back pain 12/18/2013  . Pulmonary vascular congestion 12/18/2013  . Dysphagia 12/18/2013  . Odynophagia 12/18/2013  . Gastric mass 09/07/2013  . Personal history of colonic polyps 08/05/2013  . Dysphagia, unspecified(787.20) 08/01/2013  . Nausea and vomiting 08/01/2013  . UTI (lower urinary tract infection) 12/09/2012  . Lower extremity edema 06/02/2012  . Dyspnea 06/02/2012  . Essential hypertension 06/02/2012  . History of DVT (deep vein thrombosis) 06/02/2012  . Obesity (BMI 30-39.9): BMI 31.3 06/02/2012    Past Surgical History:  Procedure Laterality Date  . CARDIOVERSION N/A 03/20/2016   Procedure: CARDIOVERSION;  Surgeon: Arnoldo Lenis, MD;  Location: [facility] ENDO SUITE;  Service: Endoscopy;  Laterality: N/A;  . CATARACT EXTRACTION W/PHACO Left 02/07/2016   Procedure: CATARACT EXTRACTION PHACO AND INTRAOCULAR LENS PLACEMENT (IOC);  Surgeon: [name], MD;  Location: [facility] ORS;  Service: Ophthalmology;  Laterality: Left;  CDE:  23.13  . COLONOSCOPY  2004   Dr. Laural Golden: three small polyps at cecum, path unknown, external hemorrhoids  . COLONOSCOPY N/A 08/16/2013   Dr.  Rourk: incomplete prep. multiple tubular adenomas, multiple biopsies. Needs surveillance in Aug 2016 due to poor prep  . CORONARY ARTERY BYPASS GRAFT     x5  . CORONARY ARTERY BYPASS GRAFT  2011  . CYSTOSCOPY N/A 12/12/2012   Procedure: CYSTOSCOPY FLEXIBLE;  Surgeon: Marissa Nestle, MD;  Location: [facility] ORS;  Service: Urology;  Laterality: N/A;  . ESOPHAGOGASTRODUODENOSCOPY N/A 08/16/2013   Dr. [name]: normal esophagus s/p Maloney dilation, gastric erosions, submucosal gastric mass vs extrinsic mass  . EUS N/A 09/07/2013   Dr. Ardis Hughs: likely benign gastric lipoma, needs CT in Sept  2016  . INCISION AND DRAINAGE ABSCESS N/A 12/20/2013   Procedure: INCISION AND DRAINAGE ABSCESS NECK;  Surgeon: Jamesetta So, MD;  Location: [facility] ORS;  Service: General;  Laterality: N/A;  . Venia Minks DILATION N/A 08/16/2013   Procedure: Keturah Shavers;  Surgeon: Daneil Dolin, MD;  Location: [facility] ENDO SUITE;  Service: Endoscopy;  Laterality: N/A;       Home Medications    Prior to Admission medications   Medication Sig Start Date End Date Taking? Authorizing Provider  amLODipine-olmesartan (AZOR) 5-40 MG tablet Take 1 tablet by mouth daily.    [provider]  apixaban (ELIQUIS) 5 MG TABS tablet Take 5 mg by mouth 2 (two) times daily.    [provider]  Besifloxacin HCl (BESIVANCE) 0.6 % SUSP Place 1 drop into the left eye 2 (two) times daily.     [provider]  budesonide-formoterol (SYMBICORT) 160-4.5 MCG/ACT inhaler Inhale 2 puffs into the lungs 2 (two) times daily.    [provider]  Cholecalciferol (VITAMIN D3) 400 units tablet Take 400 Units by mouth daily.    [provider]  cloNIDine (CATAPRES) 0.1 MG tablet Take 0.1 mg by mouth 2 (two) times daily.    [provider]  dextromethorphan-guaiFENesin (MUCINEX DM) 30-600 MG 12hr tablet Take 1 tablet by mouth 2 (two) times daily.    [provider]  furosemide (LASIX) 20 MG tablet Take 20 mg by mouth 2 (two) times daily.    [provider]  Insulin Glargine (LANTUS SOLOSTAR) 100 UNIT/ML Solostar Pen Inject 45 Units into the skin daily at 6 PM.     [provider]  insulin lispro (HUMALOG) 100 UNIT/ML injection Inject into the skin. Per sliding scale    [provider]  ipratropium-albuterol (DUONEB) 0.5-2.5 (3) MG/3ML SOLN Take 3 mLs by nebulization every 6 (six) hours as needed (wheezing).    [provider]  levofloxacin (LEVAQUIN) 500 MG tablet Take 1 tablet (500 mg total) by mouth daily. 03/21/16   [name], MD  metoprolol  succinate (TOPROL-XL) 50 MG 24 hr tablet Take 50 mg by mouth daily. Take with or immediately following a meal.    [provider]  nepafenac (ILEVRO) 0.3 % ophthalmic suspension Place 1 drop into the left eye daily.    [provider]  nitroGLYCERIN (NITROSTAT) 0.4 MG SL tablet Place 1 tablet (0.4 mg total) under the tongue every 5 (five) minutes as needed for chest pain. 12/22/13   [name], MD  Omega-3 Fatty Acids (OMEGA 3 PO) Take 1 capsule by mouth daily.    [provider]  oxymetazoline (AFRIN) 0.05 % nasal spray Place 1 spray into both nostrils 2 (two) times daily.    [provider]  pantoprazole (PROTONIX) 40 MG tablet Take 40 mg by mouth daily.    [provider]  potassium chloride (KLOR-CON) 8 MEQ tablet  Take 1 tablet (8 mEq total) by mouth daily. 03/21/16   [name], MD  rosuvastatin (CRESTOR) 10 MG tablet Take 10 mg by mouth daily.    [provider]  silodosin (RAPAFLO) 8 MG CAPS capsule Take 8 mg by mouth daily with breakfast.    [provider]  timolol (BETIMOL) 0.5 % ophthalmic solution Place 1 drop into the left eye daily.    [provider]    Family History Family History  Problem Relation Age of Onset  . Colon cancer Son 74       deceased    Social History Social History  Substance Use Topics  . Smoking status: Former Smoker    Packs/day: 1.00    Years: 11.00    Quit date: 01/05/1956  . Smokeless tobacco: Never Used  . Alcohol use No     Allergies   Patient has no known allergies.   Review of Systems Review of Systems  Constitutional: Negative for appetite change and fatigue.  HENT: Negative for congestion, ear discharge and sinus pressure.   Eyes: Negative for discharge.  Respiratory: Negative for cough.   Cardiovascular: Positive for chest pain.  Gastrointestinal: Negative for abdominal pain and diarrhea.  Genitourinary: Negative for frequency and hematuria.    Musculoskeletal: Negative for back pain.  Skin: Negative for rash.  Neurological: Negative for seizures and headaches.  Psychiatric/Behavioral: Negative for hallucinations.     Physical Exam Updated Vital Signs BP 117/63   Pulse 79   Temp 97.7 F (36.5 C) (Oral)   Resp 13   Ht 5\' 10"  (1.778 m)   Wt 102.1 kg (225 lb)   SpO2 95%   BMI 32.28 kg/m   Physical Exam  Constitutional: He is oriented to person, place, and time. He appears well-developed.  HENT:  Head: Normocephalic.  Eyes: Conjunctivae and EOM are normal. No scleral icterus.  Neck: Neck supple. No thyromegaly present.  Cardiovascular: Normal rate and regular rhythm.  Exam reveals no gallop and no friction rub.   No murmur heard. Pulmonary/Chest: No stridor. He has no wheezes. He has no rales. He exhibits no tenderness.  Abdominal: He exhibits no distension. There is no tenderness. There is no rebound.  Musculoskeletal: Normal range of motion. He exhibits no edema.  Lymphadenopathy:    He has no cervical adenopathy.  Neurological: He is oriented to person, place, and time. He exhibits normal muscle tone. Coordination normal.  Skin: No rash noted. No erythema.  Psychiatric: He has a normal mood and affect. His behavior is normal.     ED Treatments / Results  Labs (all labs ordered are listed, but only abnormal results are displayed) Labs Reviewed  CBC WITH DIFFERENTIAL/PLATELET - Abnormal; Notable for the following:       Result Value   WBC 13.2 (*)    RBC 4.08 (*)    Hemoglobin 11.5 (*)    HCT 35.8 (*)    Neutro Abs 11.0 (*)    Monocytes Absolute 1.2 (*)    All other components within normal limits  BASIC METABOLIC PANEL - Abnormal; Notable for the following:    Glucose, Bld 139 (*)    BUN 24 (*)    Creatinine, Ser 1.44 (*)    GFR calc non Af Amer 44 (*)    GFR calc Af Amer 51 (*)    All other components within normal limits  HEPATIC FUNCTION PANEL - Abnormal; Notable for the following:    Albumin  3.1 (*)  Alkaline Phosphatase 171 (*)    All other components within normal limits  BRAIN NATRIURETIC PEPTIDE - Abnormal; Notable for the following:    B Natriuretic Peptide 1,759.0 (*)    All other components within normal limits  CULTURE, BLOOD (ROUTINE X 2)  CULTURE, BLOOD (ROUTINE X 2)  TROPONIN I    EKG  EKG Interpretation None       Radiology Dg Chest 2 View  Result Date: 06/08/2016 CLINICAL DATA:  chest pain x 3 weeks, sob, and chronic cough. Copd/diabetic/hx bypass surgery/ex smoker EXAM: CHEST  2 VIEW COMPARISON:  03/19/2016 FINDINGS: Midline trachea. Mild cardiomegaly with transverse aortic atherosclerosis. Prior median sternotomy. Small left pleural effusion is not significantly changed. No pneumothorax. Mild pulmonary venous congestion. Similar patchy left base airspace disease, new since 01/10/2015. Probable bilateral nipple shadows. IMPRESSION: Since 03/19/2016, similar small left pleural effusion with adjacent airspace disease. This could represent chronic atelectasis. Pneumonia cannot be excluded. Cardiomegaly with mild pulmonary venous congestion. Probable bilateral nipple shadows. Repeat frontal radiograph with nipple markers could confirm. Electronically Signed   By: Abigail Miyamoto M.D.   On: 06/08/2016 10:25    Procedures Procedures (including critical care time)  Medications Ordered in ED Medications  vancomycin (VANCOCIN) IVPB 1000 mg/200 mL premix (not administered)  piperacillin-tazobactam (ZOSYN) IVPB 3.375 g (3.375 g Intravenous New Bag/Given 06/08/16 1238)  furosemide (LASIX) injection 40 mg (40 mg Intravenous Given 06/08/16 1238)     Initial Impression / Assessment and Plan / ED Course  I have reviewed the triage vital signs and the nursing notes.  Pertinent labs & imaging results that were available during my care of the patient were reviewed by me and considered in my medical decision making (see chart for details).     Patient with chest pain heart  failure and possibly pneumonia. Cardiology will consult with medicine will admit  Final Clinical Impressions(s) / ED Diagnoses   Final diagnoses:  Acute systolic congestive heart failure ([place])    New Prescriptions New Prescriptions   No medications on file     Milton Ferguson, MD 06/08/16 1304

## 2016-06-08 NOTE — ED Triage Notes (Signed)
Pt c/o chest pain x 3 weeks, sob, and chronic cough.  Pt went to pcp today and sent here for eval.

## 2016-06-09 ENCOUNTER — Inpatient Hospital Stay (HOSPITAL_COMMUNITY): Payer: Medicare Other

## 2016-06-09 ENCOUNTER — Encounter (HOSPITAL_COMMUNITY): Payer: Self-pay | Admitting: Gastroenterology

## 2016-06-09 DIAGNOSIS — I481 Persistent atrial fibrillation: Secondary | ICD-10-CM

## 2016-06-09 DIAGNOSIS — R1310 Dysphagia, unspecified: Secondary | ICD-10-CM

## 2016-06-09 DIAGNOSIS — I5033 Acute on chronic diastolic (congestive) heart failure: Secondary | ICD-10-CM

## 2016-06-09 DIAGNOSIS — R0602 Shortness of breath: Secondary | ICD-10-CM

## 2016-06-09 DIAGNOSIS — K222 Esophageal obstruction: Secondary | ICD-10-CM

## 2016-06-09 LAB — GLUCOSE, CAPILLARY
Glucose-Capillary: 164 mg/dL — ABNORMAL HIGH (ref 65–99)
Glucose-Capillary: 95 mg/dL (ref 65–99)
Glucose-Capillary: 352 mg/dL — ABNORMAL HIGH (ref 65–99)
Glucose-Capillary: 194 mg/dL — ABNORMAL HIGH (ref 65–99)

## 2016-06-09 LAB — BASIC METABOLIC PANEL
ANION GAP: 7 (ref 5–15)
BUN: 24 mg/dL — ABNORMAL HIGH (ref 6–20)
CHLORIDE: 102 mmol/L (ref 101–111)
CO2: 31 mmol/L (ref 22–32)
CREATININE: 1.66 mg/dL — AB (ref 0.61–1.24)
Calcium: 9.7 mg/dL (ref 8.9–10.3)
GFR calc non Af Amer: 37 mL/min — ABNORMAL LOW (ref >60)
GFR, EST AFRICAN AMERICAN: 43 mL/min — AB (ref >60)
Glucose, Bld: 146 mg/dL — ABNORMAL HIGH (ref 65–99)
POTASSIUM: 4.5 mmol/L (ref 3.5–5.1)
SODIUM: 140 mmol/L (ref 135–145)

## 2016-06-09 LAB — CBC
HEMATOCRIT: 34.3 % — AB (ref 39.0–52.0)
HEMOGLOBIN: 10.9 g/dL — AB (ref 13.0–17.0)
MCH: 28.2 pg (ref 26.0–34.0)
MCHC: 31.8 g/dL (ref 30.0–36.0)
MCV: 88.6 fL (ref 78.0–100.0)
PLATELETS: 305 10*3/uL (ref 150–400)
RBC: 3.87 MIL/uL — AB (ref 4.22–5.81)
RDW: 14.3 % (ref 11.5–15.5)
WBC: 10.8 10*3/uL — AB (ref 4.0–10.5)

## 2016-06-09 LAB — ECHOCARDIOGRAM COMPLETE
Height: 70 in
Weight: 3460.8 oz

## 2016-06-09 LAB — TROPONIN I: Troponin I: <0.03 ng/mL (ref <0.03)

## 2016-06-09 LAB — HIV ANTIBODY (ROUTINE TESTING W REFLEX): HIV Screen 4th Generation wRfx: NONREACTIVE

## 2016-06-09 MED ORDER — ONDANSETRON HCL 4 MG/2ML IJ SOLN
4.0000 mg | Freq: Four times a day (QID) | INTRAMUSCULAR | Status: DC | PRN
  Administered 2016-06-09 – 2016-06-11 (×3): 4 mg via INTRAVENOUS
  Filled 2016-06-09 (×3): qty 2

## 2016-06-09 MED ORDER — METOPROLOL SUCCINATE ER 50 MG PO TB24
75.0000 mg | ORAL_TABLET | Freq: Every day | ORAL | Status: DC
  Administered 2016-06-10 – 2016-06-15 (×6): 75 mg via ORAL
  Filled 2016-06-09 (×6): qty 1

## 2016-06-09 MED ORDER — PANTOPRAZOLE SODIUM 40 MG PO TBEC
40.0000 mg | DELAYED_RELEASE_TABLET | Freq: Two times a day (BID) | ORAL | Status: DC
  Administered 2016-06-09 – 2016-06-15 (×11): 40 mg via ORAL
  Filled 2016-06-09 (×13): qty 1

## 2016-06-09 MED ORDER — IPRATROPIUM-ALBUTEROL 0.5-2.5 (3) MG/3ML IN SOLN
3.0000 mL | Freq: Four times a day (QID) | RESPIRATORY_TRACT | Status: DC
  Administered 2016-06-09 – 2016-06-10 (×6): 3 mL via RESPIRATORY_TRACT
  Filled 2016-06-09 (×6): qty 3

## 2016-06-09 MED ORDER — PERFLUTREN LIPID MICROSPHERE
1.0000 mL | INTRAVENOUS | Status: AC | PRN
  Administered 2016-06-09 (×3): 1 mL via INTRAVENOUS
  Filled 2016-06-09: qty 10

## 2016-06-09 MED ORDER — FUROSEMIDE 10 MG/ML IJ SOLN
40.0000 mg | Freq: Every day | INTRAMUSCULAR | Status: DC
  Administered 2016-06-09 – 2016-06-11 (×3): 40 mg via INTRAVENOUS
  Filled 2016-06-09 (×3): qty 4

## 2016-06-09 MED ORDER — APIXABAN 2.5 MG PO TABS: 2.5000 mg | ORAL_TABLET | Freq: Two times a day (BID) | ORAL | Status: DC

## 2016-06-09 MED ORDER — GLUCERNA SHAKE PO LIQD
237.0000 mL | Freq: Three times a day (TID) | ORAL | Status: DC
  Administered 2016-06-09 – 2016-06-14 (×13): 237 mL via ORAL

## 2016-06-09 NOTE — Progress Notes (Signed)
*  PRELIMINARY RESULTS* Echocardiogram 2D Echocardiogram has been performed with Definity.  [name] 06/09/2016, 10:26 AM

## 2016-06-09 NOTE — Progress Notes (Signed)
Appreciate cardiology expertise she remains in A. fib which is presumably chronic henceforth after cardioversion several months ago currently on eliquis. Patient complains of dyspnea will add DuoNeb nebulizer which he has at home 4 times a day [name] PNT:[id number] DOB: Sep 17, 1931 DOA: 06/08/2016 PCP: [name], MD   Physical Exam: Blood pressure 139/60, pulse 93, temperature 97.8 F (36.6 C), temperature source Oral, resp. rate 20, height 5\' 10"  (1.778 Duncan), weight 98.1 kg (216 lb 4.8 oz), SpO2 96 %. Lungs show diminished breath sounds to bases prolonged expiratory phase scattered end expiratory wheeze noted no rales appreciable heart irregular rhythm no S3 auscultated no heaves thrills rubs   Investigations:  Recent Results (from the past 240 hour(s))  Blood culture (routine x 2)     Status: None (Preliminary result)   Collection Time: 06/08/16 12:37 PM  Result Value Ref Range Status   Specimen Description LEFT ANTECUBITAL  Final   Special Requests   Final    BOTTLES DRAWN AEROBIC AND ANAEROBIC Blood Culture adequate volume   Culture NO GROWTH < 24 HOURS  Final   Report Status PENDING  Incomplete  Blood culture (routine x 2)     Status: None (Preliminary result)   Collection Time: 06/08/16  1:04 PM  Result Value Ref Range Status   Specimen Description RIGHT ANTECUBITAL  Final   Special Requests   Final    BOTTLES DRAWN AEROBIC AND ANAEROBIC Blood Culture adequate volume   Culture NO GROWTH < 24 HOURS  Final   Report Status PENDING  Incomplete     Basic Metabolic Panel:  Recent Labs  06/08/16 1037 06/09/16 0330  NA 141 140  K 4.5 4.5  CL 102 102  CO2 29 31  GLUCOSE 139* 146*  BUN 24* 24*  CREATININE 1.44* 1.66*  CALCIUM 10.0 9.7   Liver Function Tests:  Recent Labs  06/08/16 1133  AST 15  ALT 19  ALKPHOS 171*  BILITOT 0.7  PROT 7.1  ALBUMIN 3.1*     CBC:  Recent Labs  06/08/16 1037 06/09/16 0330  WBC 13.2* 10.8*  NEUTROABS 11.0*  --   HGB  11.5* 10.9*  HCT 35.8* 34.3*  MCV 87.7 88.6  PLT 323 305    Dg Chest 2 View  Result Date: 06/08/2016 CLINICAL DATA:  chest pain x 3 weeks, sob, and chronic cough. Copd/diabetic/hx bypass surgery/ex smoker EXAM: CHEST  2 VIEW COMPARISON:  03/19/2016 FINDINGS: Midline trachea. Mild cardiomegaly with transverse aortic atherosclerosis. Prior median sternotomy. Small left pleural effusion is not significantly changed. No pneumothorax. Mild pulmonary venous congestion. Similar patchy left base airspace disease, new since 01/10/2015. Probable bilateral nipple shadows. IMPRESSION: Since 03/19/2016, similar small left pleural effusion with adjacent airspace disease. This could represent chronic atelectasis. Pneumonia cannot be excluded. Cardiomegaly with mild pulmonary venous congestion. Probable bilateral nipple shadows. Repeat frontal radiograph with nipple markers could confirm. Electronically Signed   By: Abigail Miyamoto Duncan.D.   On: 06/08/2016 10:25      Medications:   Impression: Principal Problem:   Dyspnea Active Problems:   Essential hypertension   Diabetes (HCC)   Acute on chronic diastolic CHF (congestive heart failure) (HCC)   CAD in native artery   Chest pain   Persistent atrial fibrillation (HCC)   CKD (chronic kidney disease), stage III   Esophageal stricture     Plan: GI consultation regarding recurrent despite she has status post esophageal dilatation and stricture. Add DuoNeb nebulizer 4 times a day to improve  respiratory status and pulmonary toilet  Consultants: Cardiology and GI   Procedures   Antibiotics: Vancomycin and Rocephin           Time spent: 30 minutes   LOS: 1 day   [name]   06/09/2016, 1:42 PM

## 2016-06-09 NOTE — Progress Notes (Signed)
Progress Note  Patient Name: [name] Date of Encounter: 06/09/2016  Primary Cardiologist: Dr. Satira Sark  Subjective   Complains of shortness of breath and cough. No chest pain. Also reporting a 2 month history of dysphagia.  Inpatient Medications    Scheduled Meds: . apixaban  5 mg Oral BID  . insulin aspart  0-15 Units Subcutaneous TID WC  . insulin aspart  0-5 Units Subcutaneous QHS  . insulin glargine  30 Units Subcutaneous QHS  . metoprolol succinate  50 mg Oral Daily  . potassium chloride  10 mEq Oral Daily  . prednisoLONE acetate  1 drop Left Eye QID  . rosuvastatin  10 mg Oral Daily  . sodium chloride flush  3 mL Intravenous Q12H   Continuous Infusions: . sodium chloride 250 mL (06/08/16 1901)  . azithromycin Stopped (06/08/16 2002)  . cefTRIAXone (ROCEPHIN)  IV Stopped (06/08/16 2129)   PRN Meds: sodium chloride, acetaminophen **OR** acetaminophen, oxyCODONE, perflutren lipid microspheres (DEFINITY) IV suspension, sodium chloride flush   Vital Signs    Vitals:   06/08/16 2200 06/08/16 2205 06/09/16 0652 06/09/16 1018  BP: (!) 118/56  (!) 147/85 (!) 119/56  Pulse: 81  92   Resp: 18  20   Temp: 98.2 F (36.8 C)  97.8 F (36.6 C)   TempSrc: Oral  Oral   SpO2: (!) 88% 95% 99% 95%  Weight:   216 lb 4.8 oz (98.1 kg)   Height:        Intake/Output Summary (Last 24 hours) at 06/09/16 1041 Last data filed at 06/09/16 0653  Gross per 24 hour  Intake              693 ml  Output             1300 ml  Net             -607 ml   Filed Weights   06/08/16 0958 06/08/16 1504 06/09/16 0652  Weight: 225 lb (102.1 kg) 225 lb 1.4 oz (102.1 kg) 216 lb 4.8 oz (98.1 kg)    Telemetry    Atrial fibrillation. Personally reviewed.  Physical Exam   GEN: Obese, chronically ill-appearing male in no distress.   Cardiac: Irregularly irregular, no gallop.  Respiratory:  Coarse breath sounds, slight end expiratory wheeze. GI:  Protuberant, nontender, bowel  sounds present. MS: 1-2+ leg edema; No deformity. Neuro:  Nonfocal. Psych: Alert and oriented x 3. Normal affect.  Labs    Chemistry  Recent Labs Lab 06/08/16 1037 06/08/16 1133 06/09/16 0330  NA 141  --  140  K 4.5  --  4.5  CL 102  --  102  CO2 29  --  31  GLUCOSE 139*  --  146*  BUN 24*  --  24*  CREATININE 1.44*  --  1.66*  CALCIUM 10.0  --  9.7  PROT  --  7.1  --   ALBUMIN  --  3.1*  --   AST  --  15  --   ALT  --  19  --   ALKPHOS  --  171*  --   BILITOT  --  0.7  --   GFRNONAA 44*  --  37*  GFRAA 51*  --  43*  ANIONGAP 10  --  7     Hematology  Recent Labs Lab 06/08/16 1037 06/09/16 0330  WBC 13.2* 10.8*  RBC 4.08* 3.87*  HGB 11.5* 10.9*  HCT 35.8* 34.3*  MCV 87.7 88.6  MCH 28.2 28.2  MCHC 32.1 31.8  RDW 14.4 14.3  PLT 323 305    Cardiac Enzymes  Recent Labs Lab 06/08/16 1037 06/08/16 1704 06/08/16 2106 06/09/16 0330  TROPONINI <0.03 <0.03 <0.03 <0.03   No results for input(s): TROPIPOC in the last 168 hours.   BNP  Recent Labs Lab 06/08/16 1133  BNP 1,759.0*     Radiology    Dg Chest 2 View  Result Date: 06/08/2016 CLINICAL DATA:  chest pain x 3 weeks, sob, and chronic cough. Copd/diabetic/hx bypass surgery/ex smoker EXAM: CHEST  2 VIEW COMPARISON:  03/19/2016 FINDINGS: Midline trachea. Mild cardiomegaly with transverse aortic atherosclerosis. Prior median sternotomy. Small left pleural effusion is not significantly changed. No pneumothorax. Mild pulmonary venous congestion. Similar patchy left base airspace disease, new since 01/10/2015. Probable bilateral nipple shadows. IMPRESSION: Since 03/19/2016, similar small left pleural effusion with adjacent airspace disease. This could represent chronic atelectasis. Pneumonia cannot be excluded. Cardiomegaly with mild pulmonary venous congestion. Probable bilateral nipple shadows. Repeat frontal radiograph with nipple markers could confirm. Electronically Signed   By: Abigail Miyamoto M.D.   On:  06/08/2016 10:25    Cardiac Studies   Echocardiogram pending.  Patient Profile     83 y.o. male with history of multivessel CAD status post previous PCI and ultimately CABG in 1610, chronic diastolic heart failure, paroxysmal to persistent atrial fibrillation, CAD stage III, hypertension, and previous DVT with postphlebitic syndrome. He presents to the hospital with shortness of breath and cough, progressive over the last few months. Does not report any obvious angina. Does not sense palpitations but has been back in atrial fibrillation following cardioversion earlier in the year, duration is uncertain. Cardiac enzymes argue against ACS. He does have evidence of fluid overload. Also question possible pneumonia by chest x-ray.  Assessment & Plan    1. Persistent atrial fibrillation with CHADSVASC score of 6. Would focus on strategy of heart rate control and anticoagulation at this time. Likelihood of maintaining sinus rhythm long-term is low. Follow-up echocardiogram is pending to reassess LVEF in light of associated fluid overload.   2. CAD status post CABG in 2011. Cardiac markers argue against ACS.  3. CKD stage III, creatinine 1.66.  4. History of diastolic heart failure, suspect acute exacerbation. Reassessing LVEF by follow-up echocardiogram.  5. Question recurrent pneumonia based on chest x-ray, also mild leukocytosis at presentation.  6. Patient reported two-month history of progressive dysphagia. He is on protonic as an outpatient. May need to have further GI workup at a later time.  Discussed with patient and family. Plan to increase Toprol-XL dose for better heart rate control of atrial fibrillation, concurrently reduce Eliquis to 2.5 mg twice daily (age, creatinine). Place him on IV Lasix for now, follow-up echocardiogram pending. May want to consider a noncontrasted chest CT to better assess chest x-ray findings particularly with prior history of pneumonia earlier in the year -  will defer to primary service.  Signed, Rozann Lesches, MD  06/09/2016, 10:41 AM

## 2016-06-09 NOTE — Care Management Note (Signed)
Case Management Note  Patient Details  Name: [name] MRN: [id number] Date of Birth: 10/17/1931  Subjective/Objective:                  Admitted with CP/CHF. Pt is from home, lives alone. He is ind with ADL's. He works [employer]. He has no DME or HH needs pta. He does have neb machine at home. PT has recommended HH PT and pt is agreeable. He has no preference of [facility] provider.   Action/Plan: Anticipate DC home with [facility] services through [facility]. [facility] rep, Vaughan Basta, aware of referral and will obtain pt info from chart. Pt will need to wean from oxygen ptd or have home O2 assessment. CM will cont to follow.   Expected Discharge Date:       06/12/2016           Expected Discharge Plan:  [place]  In-House Referral:  NA  Discharge planning Services  CM Consult  Post Acute Care Choice:  Home Health Choice offered to:  Patient  HH Arranged:  RN, PT [facility] Agency:  [place]  Status of Service:  In process, will continue to follow  Sherald Barge, RN 06/09/2016, 3:17 PM

## 2016-06-09 NOTE — Evaluation (Signed)
Physical Therapy Evaluation Patient Details Name: [name] MRN: [id number] DOB: [birth year] Today's Date: 06/09/2016   History of Present Illness  [name] is an 83yo white male, discheveled in appearance, who comes to [facility] on 6/4 from his PCP, after 76M SOB and productive cough. PMH: DHF, CAD s/p CABG, CKD3, AF, DM, HTN, DVT on eliquis, chronic low back pain, DM and COPD. Pt reports a single fall in the past 6 months, daughter attests. Pt reports that he chornically has occasional LLE shooting pain and buckling which he related to a lumbar disc issue, estimated 1-2x daily. He also says that prolonged AMB makes him get dizzy, 'drunk-like' and causes him to stumble, his doctor tells him this is vertigo. He monitors his BG at home and says typically between 300-500.    Clinical Impression  Pt admitted with above diagnosis. Pt currently with functional limitations due to the deficits listed below (see "PT Problem List"). Pt performing bed mobility and transfers with mild to moderate weakness, but fairly close to prior level of function. Short distance AMB to BR to void urine on room-air resultant in desat to 82%, with recovery to 99% on 2L/min. AMB in hallway slow and unsteady, but on 2L/min no patent desaturation. AMB >127ft resultant in increasing dizziness and 'drunk-like' feeling progressing to stumbling on Left with progressive gait distubance. Pt reports this is a baseline phenomenon for >2 months now but provides little detail. Dizziness abates after 3 minutes seated rest; BP normotensive, SpO2 WNL; pt denies CP, visual changes. RN notified.  Pt will benefit from skilled PT intervention to increase independence and safety with basic mobility in preparation for discharge to the venue listed below.       Follow Up Recommendations Home health PT    Equipment Recommendations  None recommended by PT    Recommendations for Other Services       Precautions / Restrictions         Mobility  Bed Mobility Overal bed mobility: Modified Independent                Transfers Overall transfer level: Modified independent                  Ambulation/Gait Ambulation/Gait assistance: Min guard Ambulation Distance (Feet): 120 Feet Assistive device: None Gait Pattern/deviations: Staggering left   Gait velocity interpretation: <1.8 ft/sec, indicative of risk for recurrent falls General Gait Details: after halfway point, pt reorts increasing dizziness progressing to stumbling on Left with gait distubance. Dizziness abates after 3 minutes seated rest; BP normotensive, SpO2 WNL; pt denies CP, visual changes.   Stairs            Wheelchair Mobility    Modified Rankin (Stroke Patients Only)       Balance Overall balance assessment: Needs assistance;History of Falls         Standing balance support: During functional activity Standing balance-Leahy Scale: Fair                               Pertinent Vitals/Pain Pain Assessment: No/denies pain    Home Living Family/patient expects to be discharged to:: Private residence Living Arrangements: Non-relatives/Friends Available Help at Discharge: Available 24 hours/day Type of Home: Mobile home Home Access: Stairs to enter Entrance Stairs-Rails: Can reach both Entrance Stairs-Number of Steps: 2 Home Layout: One level Home Equipment: None      Prior Function Level of Independence: Independent  Comments: still drives, works out in yard/workshop during the day     Journalist, newspaper        Extremity/Trunk Assessment        Lower Extremity Assessment Lower Extremity Assessment: Generalized weakness       Communication   Communication: No difficulties  Cognition Arousal/Alertness: Awake/alert Behavior During Therapy: WFL for tasks assessed/performed Overall Cognitive Status: Difficult to assess                                 General  Comments: difficulty following commands for pursed lip breathing.       General Comments      Exercises     Assessment/Plan    PT Assessment Patient needs continued PT services  PT Problem List Decreased balance;Decreased mobility;Decreased activity tolerance;Decreased range of motion;Decreased strength;Decreased safety awareness;Obesity       PT Treatment Interventions Gait training;Functional mobility training;Stair training;Therapeutic activities;Balance training;Therapeutic exercise;Patient/family education    PT Goals (Current goals can be found in the Care Plan section)  Acute Rehab PT Goals Patient Stated Goal: return to PLOF PT Goal Formulation: With patient Time For Goal Achievement: 06/23/16 Potential to Achieve Goals: Fair    Frequency Min 2X/week   Barriers to discharge Inaccessible home environment      Co-evaluation               AM-PAC PT "6 Clicks" Daily Activity  Outcome Measure Difficulty turning over in bed (including adjusting bedclothes, sheets and blankets)?: A Lot Difficulty moving from lying on back to sitting on the side of the bed? : A Lot Difficulty sitting down on and standing up from a chair with arms (e.g., wheelchair, bedside commode, etc,.)?: A Lot Help needed moving to and from a bed to chair (including a wheelchair)?: A Lot Help needed walking in hospital room?: A Lot Help needed climbing 3-5 steps with a railing? : A Lot 6 Click Score: 12    End of Session Equipment Utilized During Treatment: Gait belt;Oxygen Activity Tolerance: Patient tolerated treatment well;Treatment limited secondary to medical complications (Comment);No increased pain (dizziness, decline in gait quality ) Patient left: in bed;with call bell/phone within reach;with family/visitor present Nurse Communication: Mobility status PT Visit Diagnosis: Unsteadiness on feet (R26.81);History of falling (Z91.81);Difficulty in walking, not elsewhere classified (R26.2)     Time: 0277-4128 PT Time Calculation (min) (ACUTE ONLY): 28 min   Charges:   PT Evaluation $PT Eval Moderate Complexity: 1 Procedure PT Treatments $Therapeutic Activity: 8-22 mins   PT G Codes:        12:47 PM, 17-Jun-2016 Etta Grandchild, PT, DPT Physical Therapist - [place] [phone] (Office)    Tarita Deshmukh C 06/17/16, 12:44 PM

## 2016-06-09 NOTE — Consult Note (Signed)
Referring Provider: Dr. Cindie Laroche  Primary Care Physician:  [name], MD Primary Gastroenterologist:  Dr. [name]   Date of Admission: 06/08/16 Date of Consultation: 06/09/16  Reason for Consultation:  Dysphagia   HPI:  [name] is an 83 y.o. year old male previously well-known to our practice with history of dysphagia, nausea and vomiting, loss of appetite, and weight loss. Last seen in Dec 2015 during inpatient hospitalization. Last EGD in 2015 with normal esophagus s/p dilation, gastric erosions, negative H.pylori, submucosal gastric mass. 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 He was due for surveillance abdominal CT in 2016 for follow-up of gastric mass. Known age-related esophageal dysmotility on BPE in Dec 2015.   Admitted yesterday with dyspnea, chest pain, acute exacerbation of heart failure. Multiple co-morbidities. History of persistent afib, on Eliquis. Cardiology following during admission. Concern for pneumonia on chest xray. Leukocytosis on admission improved. GI consulted for dysphagia for the past 2 months.   Eats 3-4 spoonfuls of pork and beans, then can't eat anything else. Dysphagia to soft foods as well. Notes chronic back pain. Stays nauseated. Intermittent vomiting. Had been doing well with swallowing up until several months ago. No hematemesis. Stool looks dark brown, black for 3-4 months. States he will be constipated for awhile and then when he has a BM, it is black. Feels short of breath, about same as when he was admitted. Chills, no fever. Good appetite, just can't eat. Sipping on liquids, which come up easily as well. Takes Aleve for his back when running out of pain medication. No aspirin powders. Protonix once daily.   Past Medical History:  Diagnosis Date  . BPH (benign prostatic hyperplasia)   . CAD (coronary artery disease)    Multivessel status post CABG 2011 - LIMA to LAD, SVG to diagonal, SVG to OM, SVG to PDA  . Cellulitis    12/15   . Chronic back pain   . Chronic diastolic CHF (congestive heart failure) ([place])   . CKD (chronic kidney disease), stage III   . COPD (chronic obstructive pulmonary disease) ([place])   . Essential hypertension   . Gastric mass    EGD 9/15  . GERD (gastroesophageal reflux disease)   . History of DVT (deep vein thrombosis)    Postphlebitic syndrome  . History of kidney stones   . HOH (hard of hearing)   . Hx of CABG   . Hyperlipidemia   . Persistent atrial fibrillation ([place])    a. s/p DCCV 03/2016.  [name] Sleep apnea    Stop Bang score of 5  . Type 2 diabetes mellitus ([place])     Past Surgical History:  Procedure Laterality Date  . CARDIOVERSION N/A 03/20/2016   Procedure: CARDIOVERSION;  Surgeon: Arnoldo Lenis, MD;  [facility];  Service: Endoscopy;  Laterality: N/A;  . CATARACT EXTRACTION W/PHACO Left 02/07/2016   Procedure: CATARACT EXTRACTION PHACO AND INTRAOCULAR LENS PLACEMENT (IOC);  Surgeon: [name], MD;  Location: [facility];  Service: Ophthalmology;  Laterality: Left;  CDE:  23.13  . COLONOSCOPY  2004   Dr. Laural Golden: three small polyps at cecum, path unknown, external hemorrhoids  . COLONOSCOPY N/A 08/16/2013   Dr. [name]: incomplete prep. multiple tubular adenomas, multiple biopsies. Needs surveillance in Aug 2016 due to poor prep  . CORONARY ARTERY BYPASS GRAFT     x5  . CORONARY ARTERY BYPASS GRAFT  2011  . CYSTOSCOPY N/A 12/12/2012   Procedure: CYSTOSCOPY FLEXIBLE;  Surgeon: Marissa Nestle,  MD;  Location: [facility];  Service: Urology;  Laterality: N/A;  . ESOPHAGOGASTRODUODENOSCOPY N/A 08/16/2013   Dr. [name]: normal esophagus s/p Maloney dilation, gastric erosions, submucosal gastric mass vs extrinsic mass  . EUS N/A 09/07/2013   Dr. 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 INCISION AND DRAINAGE ABSCESS N/A 12/20/2013   Procedure: INCISION AND DRAINAGE ABSCESS NECK;  Surgeon: Jamesetta So, MD;  Location: [facility];  Service: General;  Laterality:  N/A;  . Venia Minks DILATION N/A 08/16/2013   Procedure: Keturah Shavers;  Surgeon: Daneil Dolin, MD;  Location: [facility];  Service: Endoscopy;  Laterality: N/A;    Prior to Admission medications   Medication Sig Start Date End Date Taking? Authorizing Provider  amLODipine-olmesartan (AZOR) 5-40 MG tablet Take 1 tablet by mouth daily.   Yes [provider]  apixaban (ELIQUIS) 5 MG TABS tablet Take 5 mg by mouth 2 (two) times daily.   Yes [provider]  cholecalciferol (VITAMIN D) 1000 units tablet Take 5,000 Units by mouth daily.   Yes [provider]  cloNIDine (CATAPRES) 0.1 MG tablet Take 0.1 mg by mouth 2 (two) times daily.   Yes [provider]  docusate sodium (COLACE) 100 MG capsule Take 100 mg by mouth 2 (two) times daily.   Yes [provider]  furosemide (LASIX) 20 MG tablet Take 20 mg by mouth 2 (two) times daily.   Yes [provider]  hydrocortisone cream 1 % Apply 1 application topically 2 (two) times daily.   Yes [provider]  Insulin Glargine (LANTUS SOLOSTAR) 100 UNIT/ML Solostar Pen Inject 45 Units into the skin daily at 6 PM.    Yes [provider]  insulin lispro (HUMALOG) 100 UNIT/ML injection Inject into the skin. Per sliding scale   Yes [provider]  ipratropium-albuterol (DUONEB) 0.5-2.5 (3) MG/3ML SOLN Take 3 mLs by nebulization every 6 (six) hours as needed (wheezing).   Yes [provider]  meclizine (ANTIVERT) 12.5 MG tablet Take 12.5 mg by mouth 2 (two) times daily.   Yes [provider]  metoprolol succinate (TOPROL-XL) 50 MG 24 hr tablet Take 50 mg by mouth daily. Take with or immediately following a meal.   Yes [provider]  nitroGLYCERIN (NITROSTAT) 0.4 MG SL tablet Place 1 tablet (0.4 mg total) under the tongue every 5 (five) minutes as needed for chest pain. 12/22/13  Yes [name], MD  oxyCODONE (ROXICODONE) 15 MG immediate release  tablet Take 15 mg by mouth 4 (four) times daily as needed for pain.   Yes [provider]  pantoprazole (PROTONIX) 40 MG tablet Take 40 mg by mouth daily.   Yes [provider]  potassium chloride (KLOR-CON) 8 MEQ tablet Take 1 tablet (8 mEq total) by mouth daily. 03/21/16  Yes [name], MD  prednisoLONE acetate (PRED FORTE) 1 % ophthalmic suspension Place 1 drop into the left eye 4 (four) times daily.   Yes [provider]  pregabalin (LYRICA) 50 MG capsule Take 50 mg by mouth daily.   Yes [provider]  rosuvastatin (CRESTOR) 10 MG tablet Take 10 mg by mouth daily.   Yes [provider]  silodosin (RAPAFLO) 8 MG CAPS capsule Take 8 mg by mouth daily with breakfast.   Yes [provider]  timolol (BETIMOL) 0.5 % ophthalmic solution Place 1 drop into the left eye daily.   Yes [provider]  levofloxacin (LEVAQUIN) 500 MG  tablet Take 1 tablet (500 mg total) by mouth daily. 03/21/16   [name], MD    Current Facility-Administered Medications  Medication Dose Route Frequency Provider Last Rate Last Dose  . 0.9 %  sodium chloride infusion  250 mL Intravenous PRN [name], MD 0 mL/hr at 06/08/16 1901 250 mL at 06/08/16 1901  . acetaminophen (TYLENOL) tablet 650 mg  650 mg Oral Q6H PRN Eber Jones, MD       Or  . acetaminophen (TYLENOL) suppository 650 mg  650 mg Rectal Q6H PRN Eber Jones, MD      . apixaban Arne Cleveland) tablet 2.5 mg  2.5 mg Oral BID Satira Sark, MD      . azithromycin (ZITHROMAX) 500 mg in dextrose 5 % 250 mL IVPB  500 mg Intravenous Q24H Eber Jones, MD 250 mL/hr at 06/09/16 1531 500 mg at 06/09/16 1531  . cefTRIAXone (ROCEPHIN) 1 g in dextrose 5 % 50 mL IVPB  1 g Intravenous Q24H Eber Jones, MD   Stopped at 06/08/16 2129  . furosemide (LASIX) injection 40 mg  40 mg Intravenous Daily Satira Sark, MD   40 mg at 06/09/16 1130  . insulin  aspart (novoLOG) injection 0-15 Units  0-15 Units Subcutaneous TID WC Eber Jones, MD   3 Units at 06/09/16 0730  . insulin aspart (novoLOG) injection 0-5 Units  0-5 Units Subcutaneous QHS Eber Jones, MD   2 Units at 06/08/16 2138  . insulin glargine (LANTUS) injection 30 Units  30 Units Subcutaneous QHS Eber Jones, MD   30 Units at 06/08/16 2137  . ipratropium-albuterol (DUONEB) 0.5-2.5 (3) MG/3ML nebulizer solution 3 mL  3 mL Nebulization Q6H [name], MD   3 mL at 06/09/16 1423  . [START ON 06/10/2016] metoprolol succinate (TOPROL-XL) 24 hr tablet 75 mg  75 mg Oral Daily Satira Sark, MD      . oxyCODONE (Oxy IR/ROXICODONE) immediate release tablet 15 mg  15 mg Oral Q6H PRN Eber Jones, MD   15 mg at 06/09/16 0734  . potassium chloride (K-DUR,KLOR-CON) CR tablet 10 mEq  10 mEq Oral Daily Eber Jones, MD   10 mEq at 06/09/16 0905  . prednisoLONE acetate (PRED FORTE) 1 % ophthalmic suspension 1 drop  1 drop Left Eye QID Eber Jones, MD   1 drop at 06/09/16 1327  . rosuvastatin (CRESTOR) tablet 10 mg  10 mg Oral Daily Eber Jones, MD   10 mg at 06/09/16 0905  . sodium chloride flush (NS) 0.9 % injection 3 mL  3 mL Intravenous Q12H [name], MD   3 mL at 06/09/16 0905  . sodium chloride flush (NS) 0.9 % injection 3 mL  3 mL Intravenous PRN [name], MD   3 mL at 06/08/16 1902    Allergies as of 06/08/2016  . (No Known Allergies)    Family History  Problem Relation Age of Onset  . Colon cancer Son 87       deceased    Social History   Social History  . Marital status: Single    Spouse name: N/A  . Number of children: N/A  . Years of education: N/A   Occupational History  . Not on file.   Social History Main Topics  . Smoking status: Former Smoker    Packs/day: 1.00    Years: 11.00    Quit date: 01/05/1956  . Smokeless tobacco: Never Used  .  Alcohol use No  . Drug use: No  .  Sexual activity: No   Other Topics Concern  . Not on file   Social History Narrative   ** Merged History Encounter **        Review of Systems: Gen: see HPI  CV: intermittent chest pain  Resp: +SOB, +DOE  GI: see HPI  GU : Denies urinary burning, urinary frequency, urinary incontinence.  MS: Denies joint pain,swelling, cramping Derm: Denies rash, itching, dry skin Psych: Denies depression, anxiety,confusion, or memory loss Heme: see HPI   Physical Exam: Vital signs in last 24 hours: Temp:  [97.8 F (36.6 C)-98.2 F (36.8 C)] 97.8 F (36.6 C) (06/05 1326) Pulse Rate:  [81-93] 93 (06/05 1326) Resp:  [18-20] 20 (06/05 0652) BP: (118-147)/(56-85) 139/60 (06/05 1326) SpO2:  [82 %-99 %] 96 % (06/05 1424) Weight:  [216 lb 4.8 oz (98.1 kg)] 216 lb 4.8 oz (98.1 kg) (06/05 0300) Last BM Date: 06/07/16 General:   Alert, appears stated age, chronically ill Head:  Normocephalic and atraumatic. Eyes:  Sclera clear, no icterus.   Conjunctiva pink. Ears:  Normal auditory acuity. Nose:  No deformity, discharge,  or lesions. Mouth:  No deformity or lesions, dentition normal. Lungs:  Expiratory wheeze bilaterally, diminished bases  Heart:  S1 S2 present irregularly irregular  Abdomen:  +BS, protuberant, no TTP, rebound or guarding.  Rectal:  Deferred  Extremities:  With 1+ edema. Neurologic:  Alert and  oriented x4 Psych:  Alert and cooperative. Normal mood and affect.  Intake/Output from previous day: 06/04 0701 - 06/05 0700 In: 693 [P.O.:120; I.V.:23; IV Piggyback:550] Out: 1300 [Urine:1300] Intake/Output this shift: No intake/output data recorded.  Lab Results:  Recent Labs  06/08/16 1037 06/09/16 0330  WBC 13.2* 10.8*  HGB 11.5* 10.9*  HCT 35.8* 34.3*  PLT 323 305   BMET  Recent Labs  06/08/16 1037 06/09/16 0330  NA 141 140  K 4.5 4.5  CL 102 102  CO2 29 31  GLUCOSE 139* 146*  BUN 24* 24*  CREATININE 1.44* 1.66*  CALCIUM 10.0 9.7   LFT  Recent Labs   06/08/16 1133  PROT 7.1  ALBUMIN 3.1*  AST 15  ALT 19  ALKPHOS 171*  BILITOT 0.7  BILIDIR 0.1  IBILI 0.6    Studies/Results: Dg Chest 2 View  Result Date: 06/08/2016 CLINICAL DATA:  chest pain x 3 weeks, sob, and chronic cough. Copd/diabetic/hx bypass surgery/ex smoker EXAM: CHEST  2 VIEW COMPARISON:  03/19/2016 FINDINGS: Midline trachea. Mild cardiomegaly with transverse aortic atherosclerosis. Prior median sternotomy. Small left pleural effusion is not significantly changed. No pneumothorax. Mild pulmonary venous congestion. Similar patchy left base airspace disease, new since 01/10/2015. Probable bilateral nipple shadows. IMPRESSION: Since 03/19/2016, similar small left pleural effusion with adjacent airspace disease. This could represent chronic atelectasis. Pneumonia cannot be excluded. Cardiomegaly with mild pulmonary venous congestion. Probable bilateral nipple shadows. Repeat frontal radiograph with nipple markers could confirm. Electronically Signed   By: Abigail Miyamoto M.D.   On: 06/08/2016 10:25    Impression: 83 year old male admitted with suspected acute on chronic heart failure, possible pneumonia, with reports of recurrent dysphagia for the past 2 months. Last EGD in 2015 with normal esophagus s/p dilation, gastric erosions, negative H.pylori, submucosal gastric mass. 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 He was due for surveillance abdominal CT in 2016 for follow-up of gastric mass. Known age-related esophageal dysmotility on BPE in Dec 2015. Reports black stool for the past 3-4 months, on  Eliquis for afib. No overt GI bleeding this admission.   Dysphagia: will change diet to full liquids. If respiratory status appropriate, anticipate EGD with dilation on Friday. Will hold Eliquis. AS OF NOTE: last dose evening 6/4.   History of gastric mass: felt to be a lipoma, overdue for abdominal imaging. Await EGD findings.   Possible melena as outpatient: continue to follow.  Monitor for overt GI bleeding.   Anemia: multifactorial. March 2018, Hgb 13 range. Admitting Hgb this admission 11.5. Continue to follow.    Plan: Full liquids Add PPI BID Hold Eliquis Anticipate EGD/dilation if clinically appropriate on Friday Monitor for overt GI bleeding Possible further imaging depending on results of EGD    Annitta Needs, PhD, ANP-BC [facility]     LOS: 1 day    06/09/2016, 3:45 PM

## 2016-06-10 ENCOUNTER — Inpatient Hospital Stay (HOSPITAL_COMMUNITY): Payer: Medicare Other

## 2016-06-10 DIAGNOSIS — I4891 Unspecified atrial fibrillation: Secondary | ICD-10-CM

## 2016-06-10 LAB — GLUCOSE, CAPILLARY
GLUCOSE-CAPILLARY: 188 mg/dL — AB (ref 65–99)
GLUCOSE-CAPILLARY: 195 mg/dL — AB (ref 65–99)
GLUCOSE-CAPILLARY: 201 mg/dL — AB (ref 65–99)
Glucose-Capillary: 201 mg/dL — ABNORMAL HIGH (ref 65–99)

## 2016-06-10 LAB — BASIC METABOLIC PANEL
ANION GAP: 9 (ref 5–15)
BUN: 27 mg/dL — ABNORMAL HIGH (ref 6–20)
CALCIUM: 9.7 mg/dL (ref 8.9–10.3)
CO2: 28 mmol/L (ref 22–32)
Chloride: 99 mmol/L — ABNORMAL LOW (ref 101–111)
Creatinine, Ser: 2.04 mg/dL — ABNORMAL HIGH (ref 0.61–1.24)
GFR calc Af Amer: 33 mL/min — ABNORMAL LOW (ref >60)
GFR, EST NON AFRICAN AMERICAN: 29 mL/min — AB (ref >60)
GLUCOSE: 178 mg/dL — AB (ref 65–99)
POTASSIUM: 4.8 mmol/L (ref 3.5–5.1)
Sodium: 136 mmol/L (ref 135–145)

## 2016-06-10 MED ORDER — IPRATROPIUM-ALBUTEROL 0.5-2.5 (3) MG/3ML IN SOLN
3.0000 mL | Freq: Three times a day (TID) | RESPIRATORY_TRACT | Status: DC
  Administered 2016-06-11: 3 mL via RESPIRATORY_TRACT
  Filled 2016-06-10: qty 3

## 2016-06-10 NOTE — Progress Notes (Signed)
PT Cancellation Note  Patient Details Name: [name] MRN: [id number] DOB: [birth year]   Cancelled Treatment:    Reason Eval/Treat Not Completed: Other (comment);Fatigue/lethargy limiting ability to participate (Declined, not feeling well and tired).  SOB and will try again tomorrow.   Ramond Dial 06/10/2016, 2:37 PM   2:37 PM, 06/10/16 Mee Hives, PT, MS Physical Therapist - [place] [phone] (Office)

## 2016-06-10 NOTE — Progress Notes (Signed)
Patient complains of continued dyspnea. I suspect this is worse due to loss of atrial kick? He is status post cardioversion several months ago. Question of atelectasis infiltrate in lower lobe will get CT of abdomen and chest to assess possible stomach lipoma and to elucidate CHF versus atelectasis versus infiltrate in chest [name] [name]:[id number] DOB: [birth year] DOA: 06/08/2016 PCP: [name], MD   Physical Exam: Blood pressure 130/74, pulse 82, temperature 98 F (36.7 C), temperature source Oral, resp. rate 20, height 5\' 10"  (1.778 m), weight 98.1 kg (216 lb 4.7 oz), SpO2 98 %. Lungs diminished breath sounds in the bases prolonged expiratory phase scattered rhonchi no rales audible heart irregular regular no S3 auscultated no heaves thrills rubs   Investigations:  Recent Results (from the past 240 hour(s))  Blood culture (routine x 2)     Status: None (Preliminary result)   Collection Time: 06/08/16 12:37 PM  Result Value Ref Range Status   Specimen Description LEFT ANTECUBITAL  Final   Special Requests   Final    BOTTLES DRAWN AEROBIC AND ANAEROBIC Blood Culture adequate volume   Culture NO GROWTH 2 DAYS  Final   Report Status PENDING  Incomplete  Blood culture (routine x 2)     Status: None (Preliminary result)   Collection Time: 06/08/16  1:04 PM  Result Value Ref Range Status   Specimen Description RIGHT ANTECUBITAL  Final   Special Requests   Final    BOTTLES DRAWN AEROBIC AND ANAEROBIC Blood Culture adequate volume   Culture NO GROWTH 2 DAYS  Final   Report Status PENDING  Incomplete     Basic Metabolic Panel:  Recent Labs  06/09/16 0330 06/10/16 0502  NA 140 136  K 4.5 4.8  CL 102 99*  CO2 31 28  GLUCOSE 146* 178*  BUN 24* 27*  CREATININE 1.66* 2.04*  CALCIUM 9.7 9.7   Liver Function Tests:  Recent Labs  06/08/16 1133  AST 15  ALT 19  ALKPHOS 171*  BILITOT 0.7  PROT 7.1  ALBUMIN 3.1*     CBC:  Recent Labs  06/08/16 1037  06/09/16 0330  WBC 13.2* 10.8*  NEUTROABS 11.0*  --   HGB 11.5* 10.9*  HCT 35.8* 34.3*  MCV 87.7 88.6  PLT 323 305    No results found.    Medications  Impression:  Principal Problem:   Dyspnea Active Problems:   Essential hypertension   Diabetes (HCC)   Acute on chronic diastolic CHF (congestive heart failure) (H[facility])   CAD in native artery   Chest pain   Persistent atrial fibrillation ([facility])   CKD (chronic kidney disease), stage III   Esophageal stricture   Atrial fibrillation with normal ventricular rate ([facility])     Plan: Increase beta blocker from 25 mg to 50 daily to decrease ventricular response and augment diastolic filling period. CT of abdomen and chest 2 differentiate atelectasis possible infiltrate versus volume overload and also to assess stomach lipoma. Consideration given to EGD with possible dilatation Friday for presumed esophageal stricture. Monitor renal function rising creatinine of 2.04  Consultants: Cardiology gastroenterology   Procedures  Antibiotics:         Time spent: 30 minutes  LOS: 2 days   Bitania Shankland M   06/10/2016, 12:42 PM

## 2016-06-10 NOTE — Progress Notes (Signed)
Progress Note  Patient Name: [name] Date of Encounter: 06/10/2016  Primary Cardiologist: Dr. Satira Sark  Subjective   Doesn't feel any better. Breathing not any better. Still with productive cough. No chest pain. Dysphagia.  Inpatient Medications    Scheduled Meds: . feeding supplement (GLUCERNA SHAKE)  237 mL Oral TID BM  . furosemide  40 mg Intravenous Daily  . insulin aspart  0-15 Units Subcutaneous TID WC  . insulin aspart  0-5 Units Subcutaneous QHS  . insulin glargine  30 Units Subcutaneous QHS  . ipratropium-albuterol  3 mL Nebulization Q6H  . metoprolol succinate  75 mg Oral Daily  . pantoprazole  40 mg Oral BID AC  . potassium chloride  10 mEq Oral Daily  . prednisoLONE acetate  1 drop Left Eye QID  . rosuvastatin  10 mg Oral Daily  . sodium chloride flush  3 mL Intravenous Q12H   Continuous Infusions: . sodium chloride 250 mL (06/08/16 1901)  . azithromycin Stopped (06/09/16 1631)  . cefTRIAXone (ROCEPHIN)  IV Stopped (06/09/16 2033)   PRN Meds: sodium chloride, acetaminophen **OR** acetaminophen, ondansetron (ZOFRAN) IV, oxyCODONE, sodium chloride flush   Vital Signs    Vitals:   06/09/16 2100 06/10/16 0226 06/10/16 0628 06/10/16 0731  BP: 133/65  130/74   Pulse: (!) 103  82   Resp: 20  20   Temp: 98.7 F (37.1 C)  98 F (36.7 C)   TempSrc: Oral  Oral   SpO2: 97% (!) 88% 100% 98%  Weight:   216 lb 4.7 oz (98.1 kg)   Height:        Intake/Output Summary (Last 24 hours) at 06/10/16 0935 Last data filed at 06/10/16 0100  Gross per 24 hour  Intake              253 ml  Output              225 ml  Net               28 ml   Filed Weights   06/08/16 1504 06/09/16 0652 06/10/16 0628  Weight: 225 lb 1.4 oz (102.1 kg) 216 lb 4.8 oz (98.1 kg) 216 lb 4.7 oz (98.1 kg)    Telemetry    Afib at 84 now but up to 120 at times and bradycardia yest.- Personally Reviewed  Physical Exam   GEN: Obese male with dyspnea at rest.   Neck: No  obvious JVD Cardiac: Irreg irreg, distant HS, no gallops. Respiratory: Decreased BS with rales left lung. GI: Protuberant, nontender, distended  MS: No edema; No deformity. Neuro:  Nonfocal  Psych: Normal affect   Labs    Chemistry  Recent Labs Lab 06/08/16 1037 06/08/16 1133 06/09/16 0330 06/10/16 0502  NA 141  --  140 136  K 4.5  --  4.5 4.8  CL 102  --  102 99*  CO2 29  --  31 28  GLUCOSE 139*  --  146* 178*  BUN 24*  --  24* 27*  CREATININE 1.44*  --  1.66* 2.04*  CALCIUM 10.0  --  9.7 9.7  PROT  --  7.1  --   --   ALBUMIN  --  3.1*  --   --   AST  --  15  --   --   ALT  --  19  --   --   ALKPHOS  --  171*  --   --  BILITOT  --  0.7  --   --   GFRNONAA 44*  --  37* 29*  GFRAA 51*  --  43* 33*  ANIONGAP 10  --  7 9     Hematology  Recent Labs Lab 06/08/16 1037 06/09/16 0330  WBC 13.2* 10.8*  RBC 4.08* 3.87*  HGB 11.5* 10.9*  HCT 35.8* 34.3*  MCV 87.7 88.6  MCH 28.2 28.2  MCHC 32.1 31.8  RDW 14.4 14.3  PLT 323 305    Cardiac Enzymes  Recent Labs Lab 06/08/16 1037 06/08/16 1704 06/08/16 2106 06/09/16 0330  TROPONINI <0.03 <0.03 <0.03 <0.03   No results for input(s): TROPIPOC in the last 168 hours.   BNP  Recent Labs Lab 06/08/16 1133  BNP 1,759.0*     Radiology    Dg Chest 2 View  Result Date: 06/08/2016 CLINICAL DATA:  chest pain x 3 weeks, sob, and chronic cough. Copd/diabetic/hx bypass surgery/ex smoker EXAM: CHEST  2 VIEW COMPARISON:  03/19/2016 FINDINGS: Midline trachea. Mild cardiomegaly with transverse aortic atherosclerosis. Prior median sternotomy. Small left pleural effusion is not significantly changed. No pneumothorax. Mild pulmonary venous congestion. Similar patchy left base airspace disease, new since 01/10/2015. Probable bilateral nipple shadows. IMPRESSION: Since 03/19/2016, similar small left pleural effusion with adjacent airspace disease. This could represent chronic atelectasis. Pneumonia cannot be excluded.  Cardiomegaly with mild pulmonary venous congestion. Probable bilateral nipple shadows. Repeat frontal radiograph with nipple markers could confirm. Electronically Signed   By: Abigail Miyamoto M.D.   On: 06/08/2016 10:25    Cardiac Studies   Echocardiogram 06/09/2016: Study Conclusions   - Left ventricle: The cavity size was normal. Wall thickness was   increased increased in a pattern of mild to moderate LVH.   Systolic function was normal. The estimated ejection fraction was   in the range of 60% to 65%. Wall motion was normal; there were no   regional wall motion abnormalities. The study is not technically   sufficient to allow evaluation of LV diastolic function. - Aortic valve: Mildly calcified annulus. Trileaflet; mildly   thickened, mildly calcified leaflets. - Mitral valve: Calcified annulus. There was mild regurgitation. - Left atrium: The atrium was mildly dilated. - Right atrium: Central venous pressure (est): 8 mm Hg. - Tricuspid valve: There was trivial regurgitation. - Pulmonary arteries: PA peak pressure: 42 mm Hg (S). - Pericardium, extracardiac: A prominent pericardial fat pad was   present.   Impressions:   - Mild to moderate LVH with LVEF 60-65%. Indeterminate diastolic   function. Mild left atrial enlargement. Mildly calcified mitral   annulus with mild mitral regurgitation. Sclerotic aortic valve   without stenosis. Trivial tricuspid regurgitation with PASP   estimated 42 mmHg. Prominent pericardial fat pad noted.   Lexiscan Myoview 02/27/2016:  The study is normal. No myocardial ischemia or scar.  This is a low risk study.  Nuclear stress EF: 54%.  Nonspecific ST-T abnormalities in inferolateral leads.      Patient Profile     83 y.o. male with history of multivessel CAD status post previous PCI and ultimately CABG in 3244, chronic diastolic heart failure, paroxysmal to persistent atrial fibrillation, CAD stage III, hypertension, and previous DVT with  postphlebitic syndrome. He presents to the hospital with shortness of breath and cough, progressive over the last few months. Does not report any obvious angina. Does not sense palpitations but has been back in atrial fibrillation following cardioversion earlier in the year, duration is uncertain. Cardiac enzymes argue  against ACS. He does have some evidence of fluid overload. Also question possible pneumonia by chest x-ray.   Assessment & Plan    1. Persistent atrial fibrillation with CHADSVASC score of 6. Would focus on strategy of heart rate control and anticoagulation at this time. Likelihood of maintaining sinus rhythm long-term is low. Follow-up echocardiogram with normal LVEF 60-65%. Toprol XL increased yest to 75 mg daily and Eliquis deceased 2.5 mg BID.   2. CAD status post CABG in 2011. No angina. Cardiac markers argue against ACS.   3. CKD stage III, creatinine up to 2.04 with diuresis.   4. History of diastolic heart failure, suspect some component of acute exacerbation but does not look to be main issue. Follow-up echocardiogram with normal LV function. Received Lasix 40 mg IV but I/O's positive and creatinine increasing.   5. Question recurrent pneumonia based on chest x-ray, also mild leukocytosis at presentation. Probably needs CT.   6. Patient reported two-month history of progressive dysphagia. He is on protonix as an outpatient. GI has evaluated.   Sumner Boast 06/10/2016, 9:35 AM     Attending note:  Patient seen and examined. Discussed with Ms. Vita Barley. Patient remains short of breath with intermittent productive cough, also dysphasia and abdominal fullness. Although he does have persistent atrial fibrillation and some component of diastolic heart fear, this does not seem to be a complete explanation for his symptoms and presentation. With diuresis in fact his creatinine is increased and he has not had substantial net urine output. Would consider getting a  noncontrasted CT chest and abdomen - will defer to Dr. Cindie Laroche. Gi is also evaluating dysphagia.   Satira Sark, M.D., F.A.C.C.

## 2016-06-11 DIAGNOSIS — L8990 Pressure ulcer of unspecified site, unspecified stage: Secondary | ICD-10-CM | POA: Insufficient documentation

## 2016-06-11 DIAGNOSIS — R0600 Dyspnea, unspecified: Secondary | ICD-10-CM

## 2016-06-11 LAB — BASIC METABOLIC PANEL
Anion gap: 8 (ref 5–15)
BUN: 30 mg/dL — ABNORMAL HIGH (ref 6–20)
CHLORIDE: 99 mmol/L — AB (ref 101–111)
CO2: 30 mmol/L (ref 22–32)
Calcium: 9.7 mg/dL (ref 8.9–10.3)
Creatinine, Ser: 2.34 mg/dL — ABNORMAL HIGH (ref 0.61–1.24)
GFR, EST AFRICAN AMERICAN: 28 mL/min — AB (ref >60)
GFR, EST NON AFRICAN AMERICAN: 24 mL/min — AB (ref >60)
Glucose, Bld: 150 mg/dL — ABNORMAL HIGH (ref 65–99)
POTASSIUM: 4.6 mmol/L (ref 3.5–5.1)
SODIUM: 137 mmol/L (ref 135–145)

## 2016-06-11 LAB — GLUCOSE, CAPILLARY
Glucose-Capillary: 137 mg/dL — ABNORMAL HIGH (ref 65–99)
Glucose-Capillary: 214 mg/dL — ABNORMAL HIGH (ref 65–99)
Glucose-Capillary: 289 mg/dL — ABNORMAL HIGH (ref 65–99)
Glucose-Capillary: 280 mg/dL — ABNORMAL HIGH (ref 65–99)

## 2016-06-11 MED ORDER — IPRATROPIUM-ALBUTEROL 0.5-2.5 (3) MG/3ML IN SOLN
3.0000 mL | Freq: Four times a day (QID) | RESPIRATORY_TRACT | Status: DC
  Administered 2016-06-11 – 2016-06-15 (×13): 3 mL via RESPIRATORY_TRACT
  Filled 2016-06-11 (×14): qty 3

## 2016-06-11 MED ORDER — LORAZEPAM 0.5 MG PO TABS
0.5000 mg | ORAL_TABLET | Freq: Every day | ORAL | Status: DC | PRN
  Filled 2016-06-11: qty 1

## 2016-06-11 NOTE — Progress Notes (Signed)
Progress Note  Patient Name: [name] Date of Encounter: 06/11/2016  Primary Cardiologist: Dr. Satira Sark  Subjective   Cough somewhat better. Still short of breath. Lots of trouble with dysphagia even with soft foods this morning. No chest pain.  Inpatient Medications    Scheduled Meds: . feeding supplement (GLUCERNA SHAKE)  237 mL Oral TID BM  . furosemide  40 mg Intravenous Daily  . insulin aspart  0-15 Units Subcutaneous TID WC  . insulin aspart  0-5 Units Subcutaneous QHS  . insulin glargine  30 Units Subcutaneous QHS  . ipratropium-albuterol  3 mL Nebulization TID  . metoprolol succinate  75 mg Oral Daily  . pantoprazole  40 mg Oral BID AC  . potassium chloride  10 mEq Oral Daily  . prednisoLONE acetate  1 drop Left Eye QID  . rosuvastatin  10 mg Oral Daily  . sodium chloride flush  3 mL Intravenous Q12H   Continuous Infusions: . sodium chloride 250 mL (06/08/16 1901)  . azithromycin Stopped (06/10/16 1906)  . cefTRIAXone (ROCEPHIN)  IV Stopped (06/10/16 2051)   PRN Meds: sodium chloride, acetaminophen **OR** acetaminophen, ondansetron (ZOFRAN) IV, oxyCODONE, sodium chloride flush   Vital Signs    Vitals:   06/10/16 2134 06/10/16 2139 06/11/16 0548 06/11/16 0748  BP: (!) 158/76  (!) 143/68   Pulse: 94  86   Resp: 16  18   Temp: 98.1 F (36.7 C)  98.1 F (36.7 C)   TempSrc: Oral  Oral   SpO2: 93% 92% 95% 92%  Weight:   218 lb 7.6 oz (99.1 kg)   Height:        Intake/Output Summary (Last 24 hours) at 06/11/16 0857 Last data filed at 06/11/16 5427  Gross per 24 hour  Intake                0 ml  Output              250 ml  Net             -250 ml   Filed Weights   06/09/16 0652 06/10/16 0628 06/11/16 0548  Weight: 216 lb 4.8 oz (98.1 kg) 216 lb 4.7 oz (98.1 kg) 218 lb 7.6 oz (99.1 kg)    Telemetry    Atrial fibrillation, bradycardia noted in early morning hours. Personally reviewed.  Physical Exam   GEN: Obese male. No acute  distress.   Neck: No JVD. Cardiac:  Irregularly irregular, no gallop.  Respiratory:  Mildly labored at rest. Prolonged expiratory phase and scattered rhonchi.. GI:  Obese, nontender, bowel sounds present. MS: No edema; No deformity.  Labs    Chemistry Recent Labs Lab 06/08/16 1133 06/09/16 0330 06/10/16 0502 06/11/16 0533  NA  --  140 136 137  K  --  4.5 4.8 4.6  CL  --  102 99* 99*  CO2  --  31 28 30   GLUCOSE  --  146* 178* 150*  BUN  --  24* 27* 30*  CREATININE  --  1.66* 2.04* 2.34*  CALCIUM  --  9.7 9.7 9.7  PROT 7.1  --   --   --   ALBUMIN 3.1*  --   --   --   AST 15  --   --   --   ALT 19  --   --   --   ALKPHOS 171*  --   --   --   BILITOT 0.7  --   --   --  GFRNONAA  --  37* 29* 24*  GFRAA  --  43* 33* 28*  ANIONGAP  --  7 9 8      Hematology Recent Labs Lab 06/08/16 1037 06/09/16 0330  WBC 13.2* 10.8*  RBC 4.08* 3.87*  HGB 11.5* 10.9*  HCT 35.8* 34.3*  MCV 87.7 88.6  MCH 28.2 28.2  MCHC 32.1 31.8  RDW 14.4 14.3  PLT 323 305    Cardiac Enzymes Recent Labs Lab 06/08/16 1037 06/08/16 1704 06/08/16 2106 06/09/16 0330  TROPONINI <0.03 <0.03 <0.03 <0.03   No results for input(s): TROPIPOC in the last 168 hours.   BNP Recent Labs Lab 06/08/16 1133  BNP 1,759.0*     Radiology    Ct Abdomen Wo Contrast  Result Date: 06/10/2016 CLINICAL DATA:  Productive cough, renal insufficiency, dysphagia. EXAM: CT CHEST AND ABDOMEN WITHOUT CONTRAST TECHNIQUE: Multidetector CT imaging of the chest and abdomen was performed following the standard protocol without intravenous contrast. COMPARISON:  CT scan of January 11, 2015. FINDINGS: CT CHEST FINDINGS WITHOUT CONTRAST Cardiovascular: Atherosclerosis of thoracic aorta is noted without aneurysm formation. Status post coronary artery bypass graft. No pericardial effusion is noted. Mediastinum/Nodes: No enlarged mediastinal or axillary lymph nodes. Thyroid gland, trachea, and esophagus demonstrate no significant  findings. Lungs/Pleura: No pneumothorax is noted. Minimal right basilar subsegmental atelectasis is noted. Mild left pleural effusion is noted with adjacent pneumonia or subsegmental atelectasis. Musculoskeletal: No chest wall mass or suspicious bone lesions identified. CT ABDOMEN FINDINGS WITHOUT CONTRAST Hepatobiliary: Possible minimal cholelithiasis is noted without inflammation. No abnormality seen in the liver on these unenhanced images. Pancreas: Unremarkable. No pancreatic ductal dilatation or surrounding inflammatory changes. Spleen: Normal in size without focal abnormality. Adrenals/Urinary Tract: Adrenal glands and kidneys appear normal. No hydronephrosis or renal obstruction is noted. No renal calculi are noted. Stomach/Bowel: No evidence of bowel dilatation or obstruction is noted. 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 Vascular/Lymphatic: Aortic atherosclerosis. No enlarged abdominal or pelvic lymph nodes. Other: No hernia or abnormal fluid collection is noted. Musculoskeletal: No acute or significant osseous findings. IMPRESSION: Aortic atherosclerosis. Mild left posterior basilar opacity is noted concerning for atelectasis or pneumonia with small associated pleural effusion. Possible minimal cholelithiasis. 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 Electronically Signed   By: Marijo Conception, M.D.   On: 06/10/2016 17:53   Ct Chest Wo Contrast  Result Date: 06/10/2016 CLINICAL DATA:  Productive cough, renal insufficiency, dysphagia. EXAM: CT CHEST AND ABDOMEN WITHOUT CONTRAST TECHNIQUE: Multidetector CT imaging of the chest and abdomen was performed following the standard protocol without intravenous contrast. COMPARISON:  CT scan of January 11, 2015. FINDINGS: CT CHEST FINDINGS WITHOUT CONTRAST Cardiovascular: Atherosclerosis of thoracic aorta is noted without  aneurysm formation. Status post coronary artery bypass graft. No pericardial effusion is noted. Mediastinum/Nodes: No enlarged mediastinal or axillary lymph nodes. Thyroid gland, trachea, and esophagus demonstrate no significant findings. Lungs/Pleura: No pneumothorax is noted. Minimal right basilar subsegmental atelectasis is noted. Mild left pleural effusion is noted with adjacent pneumonia or subsegmental atelectasis. Musculoskeletal: No chest wall mass or suspicious bone lesions identified. CT ABDOMEN FINDINGS WITHOUT CONTRAST Hepatobiliary: Possible minimal cholelithiasis is noted without inflammation. No abnormality seen in the liver on these unenhanced images. Pancreas: Unremarkable. No pancreatic ductal dilatation or surrounding inflammatory changes. Spleen: Normal in size without focal abnormality. Adrenals/Urinary Tract: Adrenal glands and kidneys appear  normal. No hydronephrosis or renal obstruction is noted. No renal calculi are noted. Stomach/Bowel: No evidence of bowel dilatation or obstruction is noted. 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 Vascular/Lymphatic: Aortic atherosclerosis. No enlarged abdominal or pelvic lymph nodes. Other: No hernia or abnormal fluid collection is noted. Musculoskeletal: No acute or significant osseous findings. IMPRESSION: Aortic atherosclerosis. Mild left posterior basilar opacity is noted concerning for atelectasis or pneumonia with small associated pleural effusion. Possible minimal cholelithiasis. 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 Electronically Signed   By: Marijo Conception, M.D.   On: 06/10/2016 17:53    Cardiac Studies   Echocardiogram 06/09/2016: Study Conclusions  - Left ventricle: The cavity size was normal. Wall thickness was   increased increased in a pattern of mild to moderate LVH.   Systolic function was  normal. The estimated ejection fraction was   in the range of 60% to 65%. Wall motion was normal; there were no   regional wall motion abnormalities. The study is not technically   sufficient to allow evaluation of LV diastolic function. - Aortic valve: Mildly calcified annulus. Trileaflet; mildly   thickened, mildly calcified leaflets. - Mitral valve: Calcified annulus. There was mild regurgitation. - Left atrium: The atrium was mildly dilated. - Right atrium: Central venous pressure (est): 8 mm Hg. - Tricuspid valve: There was trivial regurgitation. - Pulmonary arteries: PA peak pressure: 42 mm Hg (S). - Pericardium, extracardiac: A prominent pericardial fat pad was   present.  Impressions:  - Mild to moderate LVH with LVEF 60-65%. Indeterminate diastolic   function. Mild left atrial enlargement. Mildly calcified mitral   annulus with mild mitral regurgitation. Sclerotic aortic valve   without stenosis. Trivial tricuspid regurgitation with PASP   estimated 42 mmHg. Prominent pericardial fat pad noted.  Patient Profile     83 y.o. male with history of multivessel CAD status post previous PCI and ultimately CABG in 4259, chronic diastolic heart failure, paroxysmal to persistent atrial fibrillation, CAD stage III, hypertension, and previous DVT with postphlebitic syndrome. He presents to the hospital with shortness of breath and cough, progressive over the last few months. Does not report any obvious angina. Does not sense palpitations but has been back in atrial fibrillation following cardioversion earlier in the year, duration is uncertain. Cardiac enzymes argue against ACS. Lower chest CT does not show evidence of progressive pneumonia or mass.  Assessment & Plan    1. Persistent atrial fibrillation with CHADSVASC score of 6. He continues on Eliquis for stroke prophylaxis. Toprol-XL dose has been increased for heart rate control and LVEF is normal range of 60-65%. I reviewed Dr.  Denita Lung recent note. I do not think that atrial fibrillation is causing this much shortness of breath (and productive cough), and in fact the patient does not recall specifically feeling a whole lot better after having his cardioversion earlier this year. As noted previously, I doubt that he will be able to maintain sinus rhythm easily, and I would still tend to follow a course of heart rate control and anticoagulation.  2. History of chronic diastolic heart failure. With IV diuresis he has not had much change in his symptoms and creatinine has climbed. Would hold Lasix for now.  3. Shortness of breath with intermittently productive cough, prolonged expiratory phase. Follow-up chest CT does not suggest mass or increasing infiltrate. Might be  worth considering a Pulmonary consultation to optimize pulmonary regimen with history of COPD.  4. Significant dysphasia, pending EGD and potential esophageal dilatation.  5. CAD status post CABG in 2011. Cardiac markers are negative arguing against ACS.  6. CKD stage 3, creatinine up to 2.3 with diuresis.  Discussed with patient and family members present. I do not plan to pursue cardioversion at this time as discussed above. Consider getting Pulmonary consultation with continued shortness of breath, intermittent cough, and history of COPD. Continue heart rate control with Toprol-XL. Eliquis changed to 2.5 mg twice daily (hold for now pending EGD with potential esophageal dilatation tomorrow).  Signed, Rozann Lesches, MD  06/11/2016, 8:57 AM

## 2016-06-11 NOTE — Progress Notes (Signed)
Patient has chronic dyspnea as well as despite she which was progressive appreciate cardiology and GI. Pulmonary consultation requested regarding dyspnea atelectasis and/or infiltrate patient is currently on vancomycin as well as Zithromax since admission DuoNeb nebulizer ordered heart rate control on 75 of Toprol daily seems improved consideration for EGD and possible esophageal dilatation for a.m. eliquis on hold. VI WHITESEL [id number] DOB: [birth year] DOA: 06/08/2016 PCP: [name], MD   Physical Exam: Blood pressure (!) 129/57, pulse 88, temperature 97.6 F (36.4 C), temperature source Oral, resp. rate 18, height 5\' 10"  (1.778 m), weight 99.1 kg (218 lb 7.6 oz), SpO2 94 %. Lungs show diminished breath sounds in bases prolonged respiratory phase no rales audible no wheezes appreciated heart regular rhythm no S3 or S4 no heaves thrills rubs   Investigations:  Recent Results (from the past 240 hour(s))  Blood culture (routine x 2)     Status: None (Preliminary result)   Collection Time: 06/08/16 12:37 PM  Result Value Ref Range Status   Specimen Description LEFT ANTECUBITAL  Final   Special Requests   Final    BOTTLES DRAWN AEROBIC AND ANAEROBIC Blood Culture adequate volume   Culture NO GROWTH 3 DAYS  Final   Report Status PENDING  Incomplete  Blood culture (routine x 2)     Status: None (Preliminary result)   Collection Time: 06/08/16  1:04 PM  Result Value Ref Range Status   Specimen Description RIGHT ANTECUBITAL  Final   Special Requests   Final    BOTTLES DRAWN AEROBIC AND ANAEROBIC Blood Culture adequate volume   Culture NO GROWTH 3 DAYS  Final   Report Status PENDING  Incomplete     Basic Metabolic Panel:  Recent Labs  06/10/16 0502 06/11/16 0533  NA 136 137  K 4.8 4.6  CL 99* 99*  CO2 28 30  GLUCOSE 178* 150*  BUN 27* 30*  CREATININE 2.04* 2.34*  CALCIUM 9.7 9.7   Liver Function Tests: No results for input(s): AST, ALT, ALKPHOS, BILITOT, PROT,  ALBUMIN in the last 72 hours.   CBC:  Recent Labs  06/09/16 0330  WBC 10.8*  HGB 10.9*  HCT 34.3*  MCV 88.6  PLT 305    Ct Abdomen Wo Contrast  Result Date: 06/10/2016 CLINICAL DATA:  Productive cough, renal insufficiency, dysphagia. EXAM: CT CHEST AND ABDOMEN WITHOUT CONTRAST TECHNIQUE: Multidetector CT imaging of the chest and abdomen was performed following the standard protocol without intravenous contrast. COMPARISON:  CT scan of January 11, 2015. FINDINGS: CT CHEST FINDINGS WITHOUT CONTRAST Cardiovascular: Atherosclerosis of thoracic aorta is noted without aneurysm formation. Status post coronary artery bypass graft. No pericardial effusion is noted. Mediastinum/Nodes: No enlarged mediastinal or axillary lymph nodes. Thyroid gland, trachea, and esophagus demonstrate no significant findings. Lungs/Pleura: No pneumothorax is noted. Minimal right basilar subsegmental atelectasis is noted. Mild left pleural effusion is noted with adjacent pneumonia or subsegmental atelectasis. Musculoskeletal: No chest wall mass or suspicious bone lesions identified. CT ABDOMEN FINDINGS WITHOUT CONTRAST Hepatobiliary: Possible minimal cholelithiasis is noted without inflammation. No abnormality seen in the liver on these unenhanced images. Pancreas: Unremarkable. No pancreatic ductal dilatation or surrounding inflammatory changes. Spleen: Normal in size without focal abnormality. Adrenals/Urinary Tract: Adrenal glands and kidneys appear normal. No hydronephrosis or renal obstruction is noted. No renal calculi are noted. Stomach/Bowel: No evidence of bowel dilatation or obstruction is noted. 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 Vascular/Lymphatic:  Aortic atherosclerosis. No enlarged abdominal or pelvic lymph nodes. Other: No hernia or abnormal fluid collection is noted. Musculoskeletal: No acute or significant osseous findings.  IMPRESSION: Aortic atherosclerosis. Mild left posterior basilar opacity is noted concerning for atelectasis or pneumonia with small associated pleural effusion. Possible minimal cholelithiasis. 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 Electronically Signed   By: Marijo Conception, M.D.   On: 06/10/2016 17:53   Ct Chest Wo Contrast  Result Date: 06/10/2016 CLINICAL DATA:  Productive cough, renal insufficiency, dysphagia. EXAM: CT CHEST AND ABDOMEN WITHOUT CONTRAST TECHNIQUE: Multidetector CT imaging of the chest and abdomen was performed following the standard protocol without intravenous contrast. COMPARISON:  CT scan of January 11, 2015. FINDINGS: CT CHEST FINDINGS WITHOUT CONTRAST Cardiovascular: Atherosclerosis of thoracic aorta is noted without aneurysm formation. Status post coronary artery bypass graft. No pericardial effusion is noted. Mediastinum/Nodes: No enlarged mediastinal or axillary lymph nodes. Thyroid gland, trachea, and esophagus demonstrate no significant findings. Lungs/Pleura: No pneumothorax is noted. Minimal right basilar subsegmental atelectasis is noted. Mild left pleural effusion is noted with adjacent pneumonia or subsegmental atelectasis. Musculoskeletal: No chest wall mass or suspicious bone lesions identified. CT ABDOMEN FINDINGS WITHOUT CONTRAST Hepatobiliary: Possible minimal cholelithiasis is noted without inflammation. No abnormality seen in the liver on these unenhanced images. Pancreas: Unremarkable. No pancreatic ductal dilatation or surrounding inflammatory changes. Spleen: Normal in size without focal abnormality. Adrenals/Urinary Tract: Adrenal glands and kidneys appear normal. No hydronephrosis or renal obstruction is noted. No renal calculi are noted. Stomach/Bowel: No evidence of bowel dilatation or obstruction is noted. Visualized portion of appendix appears normal per stable 2.1 cm lipoma is seen within the distal  portion of the stomach. Vascular/Lymphatic: Aortic atherosclerosis. No enlarged abdominal or pelvic lymph nodes. Other: No hernia or abnormal fluid collection is noted. Musculoskeletal: No acute or significant osseous findings. IMPRESSION: Aortic atherosclerosis. Mild left posterior basilar opacity is noted concerning for atelectasis or pneumonia with small associated pleural effusion. Possible minimal cholelithiasis. 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 Electronically Signed   By: Marijo Conception, M.D.   On: 06/10/2016 17:53      Medications:   Impression:  Principal Problem:   Dyspnea Active Problems:   Essential hypertension   Diabetes (HCC)   Acute on chronic diastolic CHF (congestive heart failure) (HCC)   CAD in native artery   Chest pain   Persistent atrial fibrillation (HCC)   CKD (chronic kidney disease), stage III   Esophageal stricture   Atrial fibrillation with normal ventricular rate (HCC)   Pressure injury of skin     Plan: Hold diuresis secondary to increasing creatinine. We'll reconsult requested for optimization of pulmonary status as well as atelectasis/infiltrate patient to have EGD in a.m. Consultants: Cardiology, gastroenterology, pulmonary requested    Procedures   Antibiotics: Vancomycin Zithromax        Time spent: 30 minutes   LOS: 3 days   Okema Rollinson M   06/11/2016, 1:24 PM

## 2016-06-11 NOTE — Progress Notes (Signed)
Subjective:  Breathing only slightly improved. Trouble swallowing even soft foods.   Objective: Vital signs in last 24 hours: Temp:  [98.1 F (36.7 C)-98.5 F (36.9 C)] 98.1 F (36.7 C) (06/07 0548) Pulse Rate:  [86-94] 86 (06/07 0548) Resp:  [16-18] 18 (06/07 0548) BP: (134-158)/(66-76) 143/68 (06/07 0548) SpO2:  [92 %-95 %] 95 % (06/07 0548) Weight:  [218 lb 7.6 oz (99.1 kg)] 218 lb 7.6 oz (99.1 kg) (06/07 0548) Last BM Date: 06/07/16 General:   Alert,  Well-developed, well-nourished, pleasant and cooperative in NAD Head:  Normocephalic and atraumatic. Eyes:  Sclera clear, no icterus.  Chest: somewhat labored at rest but able to complete sentences. scattered rhonchi.     Heart:  Irregular rhythm; no murmurs, clicks, rubs,  or gallops. Abdomen:  Soft, nontender and nondistended.   Normal bowel sounds, without guarding, and without rebound.   Extremities:  Without clubbing, deformity or edema. Neurologic:  Alert and  oriented x4;  grossly normal neurologically. Skin:  Intact without significant lesions or rashes. Psych:  Alert and cooperative. Normal mood and affect.  Intake/Output from previous day: 06/06 0701 - 06/07 0700 In: -  Out: 250 [Urine:250] Intake/Output this shift: No intake/output data recorded.  Lab Results: CBC  Recent Labs  06/09/16 0330  WBC 10.8*  HGB 10.9*  HCT 34.3*  MCV 88.6  PLT 305   BMET  Recent Labs  06/09/16 0330 06/10/16 0502 06/11/16 0533  NA 140 136 137  K 4.5 4.8 4.6  CL 102 99* 99*  CO2 31 28 30   GLUCOSE 146* 178* 150*  BUN 24* 27* 30*  CREATININE 1.66* 2.04* 2.34*  CALCIUM 9.7 9.7 9.7   LFTs  Recent Labs  06/08/16 1133  BILITOT 0.7  BILIDIR 0.1  IBILI 0.6  ALKPHOS 171*  AST 15  ALT 19  PROT 7.1  ALBUMIN 3.1*   No results for input(s): LIPASE in the last 72 hours. PT/INR No results for input(s): LABPROT, INR in the last 72 hours.    Imaging Studies: Ct Abdomen Wo Contrast  Result Date:  06/10/2016 CLINICAL DATA:  Productive cough, renal insufficiency, dysphagia. EXAM: CT CHEST AND ABDOMEN WITHOUT CONTRAST TECHNIQUE: Multidetector CT imaging of the chest and abdomen was performed following the standard protocol without intravenous contrast. COMPARISON:  CT scan of January 11, 2015. FINDINGS: CT CHEST FINDINGS WITHOUT CONTRAST Cardiovascular: Atherosclerosis of thoracic aorta is noted without aneurysm formation. Status post coronary artery bypass graft. No pericardial effusion is noted. Mediastinum/Nodes: No enlarged mediastinal or axillary lymph nodes. Thyroid gland, trachea, and esophagus demonstrate no significant findings. Lungs/Pleura: No pneumothorax is noted. Minimal right basilar subsegmental atelectasis is noted. Mild left pleural effusion is noted with adjacent pneumonia or subsegmental atelectasis. Musculoskeletal: No chest wall mass or suspicious bone lesions identified. CT ABDOMEN FINDINGS WITHOUT CONTRAST Hepatobiliary: Possible minimal cholelithiasis is noted without inflammation. No abnormality seen in the liver on these unenhanced images. Pancreas: Unremarkable. No pancreatic ductal dilatation or surrounding inflammatory changes. Spleen: Normal in size without focal abnormality. Adrenals/Urinary Tract: Adrenal glands and kidneys appear normal. No hydronephrosis or renal obstruction is noted. No renal calculi are noted. Stomach/Bowel: No evidence of bowel dilatation or obstruction is noted. 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 Vascular/Lymphatic: Aortic atherosclerosis. No enlarged abdominal or pelvic lymph nodes. Other: No hernia or abnormal fluid collection is noted. Musculoskeletal: No acute or significant osseous findings. IMPRESSION: Aortic atherosclerosis. Mild left posterior basilar  opacity is noted concerning for atelectasis or pneumonia with small associated pleural effusion. Possible minimal  cholelithiasis. 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 Electronically Signed   By: Marijo Conception, M.D.   On: 06/10/2016 17:53   Dg Chest 2 View  Result Date: 06/08/2016 CLINICAL DATA:  chest pain x 3 weeks, sob, and chronic cough. Copd/diabetic/hx bypass surgery/ex smoker EXAM: CHEST  2 VIEW COMPARISON:  03/19/2016 FINDINGS: Midline trachea. Mild cardiomegaly with transverse aortic atherosclerosis. Prior median sternotomy. Small left pleural effusion is not significantly changed. No pneumothorax. Mild pulmonary venous congestion. Similar patchy left base airspace disease, new since 01/10/2015. Probable bilateral nipple shadows. IMPRESSION: Since 03/19/2016, similar small left pleural effusion with adjacent airspace disease. This could represent chronic atelectasis. Pneumonia cannot be excluded. Cardiomegaly with mild pulmonary venous congestion. Probable bilateral nipple shadows. Repeat frontal radiograph with nipple markers could confirm. Electronically Signed   By: Abigail Miyamoto M.D.   On: 06/08/2016 10:25   Ct Chest Wo Contrast  Result Date: 06/10/2016 CLINICAL DATA:  Productive cough, renal insufficiency, dysphagia. EXAM: CT CHEST AND ABDOMEN WITHOUT CONTRAST TECHNIQUE: Multidetector CT imaging of the chest and abdomen was performed following the standard protocol without intravenous contrast. COMPARISON:  CT scan of January 11, 2015. FINDINGS: CT CHEST FINDINGS WITHOUT CONTRAST Cardiovascular: Atherosclerosis of thoracic aorta is noted without aneurysm formation. Status post coronary artery bypass graft. No pericardial effusion is noted. Mediastinum/Nodes: No enlarged mediastinal or axillary lymph nodes. Thyroid gland, trachea, and esophagus demonstrate no significant findings. Lungs/Pleura: No pneumothorax is noted. Minimal right basilar subsegmental atelectasis is noted. Mild left pleural effusion is noted with adjacent pneumonia or subsegmental  atelectasis. Musculoskeletal: No chest wall mass or suspicious bone lesions identified. CT ABDOMEN FINDINGS WITHOUT CONTRAST Hepatobiliary: Possible minimal cholelithiasis is noted without inflammation. No abnormality seen in the liver on these unenhanced images. Pancreas: Unremarkable. No pancreatic ductal dilatation or surrounding inflammatory changes. Spleen: Normal in size without focal abnormality. Adrenals/Urinary Tract: Adrenal glands and kidneys appear normal. No hydronephrosis or renal obstruction is noted. No renal calculi are noted. Stomach/Bowel: No evidence of bowel dilatation or obstruction is noted. 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 Vascular/Lymphatic: Aortic atherosclerosis. No enlarged abdominal or pelvic lymph nodes. Other: No hernia or abnormal fluid collection is noted. Musculoskeletal: No acute or significant osseous findings. IMPRESSION: Aortic atherosclerosis. Mild left posterior basilar opacity is noted concerning for atelectasis or pneumonia with small associated pleural effusion. Possible minimal cholelithiasis. 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 Electronically Signed   By: Marijo Conception, M.D.   On: 06/10/2016 17:53   Mr Lumbar Spine Wo Contrast  Result Date: 06/05/2016 CLINICAL DATA:  Severe chronic right-sided low back pain. EXAM: MRI LUMBAR SPINE WITHOUT CONTRAST TECHNIQUE: Multiplanar, multisequence MR imaging of the lumbar spine was performed. No intravenous contrast was administered. COMPARISON:  MRI DATED 01/23/2011 FINDINGS: Segmentation:  Standard. Alignment:  Physiologic. Vertebrae:  No fracture, evidence of discitis, or bone lesion. Conus medullaris: Extends to the L1-2 level and appears normal. Paraspinal and other soft tissues: Negative. Disc levels: L1-2 AND L2-3:  Slight disc desiccation.  Otherwise normal. L3-4: Tiny  broad-based disc bulge with a small protrusion into the right neural foramen without neural impingement. This is slightly more prominent than the disc bulge present on the prior study.  L4-5: Soft disc protrusion into the right neural foramen adjacent 2 but not compressing the right L4 nerve. This could irritate the nerve, however. The finding is more prominent than on the prior study. Small disc bulge into the left neural foramen. Slight hypertrophy of the ligamentum flavum without neural impingement. L5-S1: Small disc protrusion into the right neural foramen adjacent to the right L5 nerve. Hypertrophy of the right ligamentum flavum creates moderate right foraminal stenosis. This could irritate the right L5 nerve. This is more prominent on the prior study. Small disc bulge into the left neural foramen with moderate left foraminal stenosis which could affect the left L5 nerve. Slight hypertrophy of the left facet joint. IMPRESSION: Progressive degenerative disc disease at L3-4 through L5-S1 with new small disc protrusions into the right neural foramina at L3-4, L4-5, and L5-S1. These could possibly irritate the exiting nerve roots. Electronically Signed   By: Lorriane Shire M.D.   On: 06/05/2016 11:19  [2 weeks]   Assessment: 83 year old male admitted with suspected acute on chronic heart failure, possible pneumonia, with reports of recurrent dysphagia for the past 2 months. Last EGD in 2015 with normal esophagus s/p dilation, gastric erosions, negative H.pylori, submucosal gastric mass. 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 He was due for surveillance abdominal CT in 2016 for follow-up of gastric mass. CT Abd without contrast updated this admission. Known age-related esophageal dysmotility on BPE in Dec 2015. Reports black stool for the past 3-4 months, on Eliquis for afib. No overt GI bleeding this admission.   Dysphagia: currently on full liquids. Will hold Eliquis. AS OF NOTE: last dose evening  6/4. Hoping for EGD/ED Friday 06/12/16 if stable from respiratory standpoint. Per note, ok from cardiology standpoint.   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 CT updated this admission, stable. Await EGD findings.   Possible melena as outpatient: continue to follow. Monitor for overt GI bleeding.   Anemia: multifactorial. March 2018, Hgb 13 range. Current Hgb 10.9. Continue to follow. Incomplete bowel prep for attempted colonoscopy in 2015 and was supposed to have early interval follow up in 2016 but patient did not follow through.   Plan: 1. NPO after midnight.  2. Continue to hold Eliquis.  3. Possible EGD/ED tomorrow if clinically appropriate. Reassess in the AM.   Laureen Ochs. Bernarda Caffey [facility] [phone] 6/7/201812:57 PM     LOS: 3 days

## 2016-06-11 NOTE — Progress Notes (Signed)
Inpatient Diabetes Program Recommendations  AACE/ADA: New Consensus Statement on Inpatient Glycemic Control (2015)  Target Ranges:  Prepandial:   less than 140 mg/dL      Peak postprandial:   less than 180 mg/dL (1-2 hours)      Critically ill patients:  140 - 180 mg/dL   Results for [name], [name] (MRN [id number]) as of 06/11/2016 11:04  Ref. Range 06/10/2016 08:01 06/10/2016 12:11 06/10/2016 17:23 06/10/2016 21:31 06/11/2016 07:43  Glucose-Capillary Latest Ref Range: 65 - 99 mg/dL 188 (H) 195 (H) 201 (H) 201 (H) 137 (H)   Review of Glycemic Control  Diabetes history: DM2 Outpatient Diabetes medications: Lantus 45 units daily at 6PM, Humalog per sliding scale Current orders for Inpatient glycemic control: Lantus 30 units QHS, Novolog 0-15 units TID with meals, Novolog 0-5 units QHS  Inpatient Diabetes Program Recommendations: Insulin - Meal Coverage: Noted glucose consistently elevated after Glucerna intake. Please consider ordering Novolog 3 units TID with Glucerna (note poor PO intake with diet).  Thanks, Barnie Alderman, RN, MSN, CDE Diabetes Coordinator Inpatient Diabetes Program [phone] (Team Pager from 8am to 5pm)

## 2016-06-11 NOTE — Progress Notes (Signed)
Physical Therapy Treatment Patient Details Name: [name] MRN: [id number] DOB: 05/24/1931 Today's Date: 06/11/2016    History of Present Illness [name] is an 83yo white male, discheveled in appearance, who comes to [facility] on 6/4 from his PCP, after 3M SOB and productive cough. PMH: DHF, CAD s/p CABG, CKD3, AF, DM, HTN, DVT on eliquis, chronic low back pain, DM and COPD. Pt reports a single fall in the past 6 months, daughter attests. Pt reports that he chornically has occasional LLE shooting pain and buckling which he related to a lumbar disc issue, estimated 1-2x daily. He also says that prolonged AMB makes him get dizzy, 'drunk-like' and causes him to stumble, his doctor tells him this is vertigo. He monitors his BG at home and says typically between 300-500.      PT Comments    Pt was in bathroom with nurse tech.  She had just taken vitals, BP 132/77 and O2 93%.  Pt with noted SOB requiring rest before ambulating after returning from the restroom.  O2 monitored during entire treatment, without O2 and remained 92-96%.  Encouraged patient to use correct breathing as pt is a mouth breather.  Pt ambulated 150 feet without AD or LOB.  Pt returned to room, again SOB but without O2 desaturation.  Pt independent with bed mobility.    Follow Up Recommendations  Home health PT     Equipment Recommendations  None recommended by PT    Recommendations for Other Services       Precautions / Restrictions      Mobility  Bed Mobility Overal bed mobility: Modified Independent                Transfers Overall transfer level: Modified independent                  Ambulation/Gait Ambulation/Gait assistance: Min guard Ambulation Distance (Feet): 150 Feet Assistive device: None Gait Pattern/deviations: Decreased stride length;WFL(Within Functional Limits)     General Gait Details: Pt able to complete distance, just with heavy breathing;; oxygen sats were  normal.   Stairs            Wheelchair Mobility    Modified Rankin (Stroke Patients Only)       Balance                                            Cognition Arousal/Alertness: Awake/alert Behavior During Therapy: WFL for tasks assessed/performed Overall Cognitive Status: Difficult to assess                                 General Comments: difficulty following commands for pursed lip breathing.       Exercises      General Comments        Pertinent Vitals/Pain      Home Living                      Prior Function            PT Goals (current goals can now be found in the care plan section)      Frequency    Min 2X/week      PT Plan  continue progression    Co-evaluation  AM-PAC PT "6 Clicks" Daily Activity  Outcome Measure  Difficulty turning over in bed (including adjusting bedclothes, sheets and blankets)?: A Lot Difficulty moving from lying on back to sitting on the side of the bed? : A Lot Difficulty sitting down on and standing up from a chair with arms (e.g., wheelchair, bedside commode, etc,.)?: A Lot Help needed moving to and from a bed to chair (including a wheelchair)?: A Lot Help needed walking in hospital room?: A Lot Help needed climbing 3-5 steps with a railing? : A Lot 6 Click Score: 12    End of Session Equipment Utilized During Treatment: Gait belt Activity Tolerance: Patient tolerated treatment well;Patient limited by fatigue (dizziness, decline in gait quality ) Patient left: in bed;with call bell/phone within reach;with family/visitor present Nurse Communication: Mobility status PT Visit Diagnosis: Unsteadiness on feet (R26.81);History of falling (Z91.81);Difficulty in walking, not elsewhere classified (R26.2)     Time: 7793-9030 PT Time Calculation (min) (ACUTE ONLY): 15 min  Charges:  $Gait Training: 8-22 mins                    G Codes:       Teena Irani, PTA/CLT [place], [name] 06/11/2016, 3:59 PM

## 2016-06-12 ENCOUNTER — Encounter (HOSPITAL_COMMUNITY)
Admission: EM | Disposition: A | Discharge status: Home Health | Payer: Self-pay | Source: Home / Self Care | Attending: Family Medicine

## 2016-06-12 ENCOUNTER — Inpatient Hospital Stay (HOSPITAL_COMMUNITY): Payer: Medicare Other

## 2016-06-12 ENCOUNTER — Inpatient Hospital Stay (HOSPITAL_COMMUNITY): Payer: Medicare Other | Admitting: Anesthesiology

## 2016-06-12 ENCOUNTER — Ambulatory Visit: Payer: Medicare Other | Admitting: Gastroenterology

## 2016-06-12 DIAGNOSIS — J449 Chronic obstructive pulmonary disease, unspecified: Secondary | ICD-10-CM

## 2016-06-12 DIAGNOSIS — K222 Esophageal obstruction: Secondary | ICD-10-CM

## 2016-06-12 LAB — BASIC METABOLIC PANEL
Anion gap: 11 (ref 5–15)
BUN: 32 mg/dL — ABNORMAL HIGH (ref 6–20)
CALCIUM: 9.9 mg/dL (ref 8.9–10.3)
CO2: 30 mmol/L (ref 22–32)
CREATININE: 2.03 mg/dL — AB (ref 0.61–1.24)
Chloride: 96 mmol/L — ABNORMAL LOW (ref 101–111)
GFR calc Af Amer: 33 mL/min — ABNORMAL LOW (ref >60)
GFR calc non Af Amer: 29 mL/min — ABNORMAL LOW (ref >60)
GLUCOSE: 176 mg/dL — AB (ref 65–99)
Potassium: 4.5 mmol/L (ref 3.5–5.1)
Sodium: 137 mmol/L (ref 135–145)

## 2016-06-12 LAB — STREP PNEUMONIAE URINARY ANTIGEN: STREP PNEUMO URINARY ANTIGEN: NEGATIVE

## 2016-06-12 LAB — GLUCOSE, CAPILLARY
GLUCOSE-CAPILLARY: 194 mg/dL — AB (ref 65–99)
GLUCOSE-CAPILLARY: 349 mg/dL — AB (ref 65–99)
Glucose-Capillary: 158 mg/dL — ABNORMAL HIGH (ref 65–99)
Glucose-Capillary: 428 mg/dL — ABNORMAL HIGH (ref 65–99)

## 2016-06-12 SURGERY — ESOPHAGOGASTRODUODENOSCOPY (EGD) WITH PROPOFOL
Anesthesia: Monitor Anesthesia Care

## 2016-06-12 MED ORDER — APIXABAN 2.5 MG PO TABS
2.5000 mg | ORAL_TABLET | Freq: Two times a day (BID) | ORAL | Status: DC
  Administered 2016-06-12 – 2016-06-14 (×5): 2.5 mg via ORAL
  Filled 2016-06-12 (×7): qty 1

## 2016-06-12 MED ORDER — SODIUM CHLORIDE 0.9 % IV SOLN: INTRAVENOUS | Status: DC

## 2016-06-12 MED ORDER — IPRATROPIUM-ALBUTEROL 0.5-2.5 (3) MG/3ML IN SOLN
RESPIRATORY_TRACT | Status: AC
Start: 2016-06-12 — End: ?
  Filled 2016-06-12: qty 3

## 2016-06-12 MED ORDER — TECHNETIUM TO 99M ALBUMIN AGGREGATED
4.0000 | Freq: Once | INTRAVENOUS | Status: AC | PRN
  Administered 2016-06-12: 4.3 via INTRAVENOUS

## 2016-06-12 MED ORDER — IPRATROPIUM-ALBUTEROL 0.5-2.5 (3) MG/3ML IN SOLN
3.0000 mL | Freq: Once | RESPIRATORY_TRACT | Status: AC
  Administered 2016-06-12: 3 mL via RESPIRATORY_TRACT

## 2016-06-12 MED ORDER — ORAL CARE MOUTH RINSE
15.0000 mL | Freq: Two times a day (BID) | OROMUCOSAL | Status: DC
  Administered 2016-06-12 – 2016-06-15 (×6): 15 mL via OROMUCOSAL

## 2016-06-12 MED ORDER — PREDNISONE 20 MG PO TABS
40.0000 mg | ORAL_TABLET | Freq: Every day | ORAL | Status: DC
  Administered 2016-06-12 – 2016-06-14 (×3): 40 mg via ORAL
  Filled 2016-06-12 (×3): qty 2

## 2016-06-12 NOTE — Consult Note (Signed)
Consult requested by: Dr. Lorriane Shire Consult requested for: Shortness of breath  HPI: This is an 83 year old who has history of diastolic CHF coronary disease chronic kidney disease atrial fib diabetes obesity hypertension and shortness of breath. He says he's not felt well at all since hospitalization for pneumonia in March 2018. He does say that he felt a little bit worse prior to that admission. He has been coughing up yellow sputum. He is coughing during my exam but coughed up more clear sputum. He's not had any definite fever but he doesn't have a way to measure it at home. He says he hears himself wheeze sometimes. He has a fairly mild smoking history of about 11 pack years and stopped many years ago. He is known to have atrial fib and has some element of diastolic heart failure. He had previous cardiac bypass surgery. He has chest pain that he associates with his cough. He does not have any hemoptysis. No nausea vomiting diarrhea.  Past Medical History:  Diagnosis Date  . BPH (benign prostatic hyperplasia)   . CAD (coronary artery disease)    Multivessel status post CABG 2011 - LIMA to LAD, SVG to diagonal, SVG to OM, SVG to PDA  . Cellulitis    12/15  . Chronic back pain   . Chronic diastolic CHF (congestive heart failure) ([place])   . CKD (chronic kidney disease), stage III   . COPD (chronic obstructive pulmonary disease) ([place])   . Essential hypertension   . Gastric mass    EGD 9/15  . GERD (gastroesophageal reflux disease)   . History of DVT (deep vein thrombosis)    Postphlebitic syndrome  . History of kidney stones   . HOH (hard of hearing)   . Hx of CABG   . Hyperlipidemia   . Persistent atrial fibrillation ([place])    a. s/p DCCV 03/2016.  [name] Sleep apnea    Stop Bang score of 5  . Type 2 diabetes mellitus (HCC)      Family History  Problem Relation Age of Onset  . Colon cancer Son 60       deceased    He is unsure if he has family history of COPD or not Social History    Social History  . Marital status: Single    Spouse name: N/A  . Number of children: N/A  . Years of education: N/A   Social History Main Topics  . Smoking status: Former Smoker    Packs/day: 1.00    Years: 11.00    Quit date: 01/05/1956  . Smokeless tobacco: Never Used  . Alcohol use No  . Drug use: No  . Sexual activity: No   Other Topics Concern  . None   Social History Narrative   ** Merged History Encounter **         ROS: Except as mentioned 10 point review of systems is negative    Objective: Vital signs in last 24 hours: Temp:  [97.6 F (36.4 C)-98.2 F (36.8 C)] 97.9 F (36.6 C) (06/08 0754) Pulse Rate:  [75-87] 75 (06/07 2251) Resp:  [20-55] 55 (06/08 0800) BP: (124-149)/(70-82) 149/82 (06/08 0800) SpO2:  [92 %-96 %] 95 % (06/08 0800) Weight change:  Last BM Date: 06/07/16  Intake/Output from previous day: 06/07 0701 - 06/08 0700 In: 189 [P.O.:120; I.V.:19; IV Piggyback:50] Out: 826 [Urine:826]  PHYSICAL EXAM Constitutional: He is obese coughing but otherwise in no acute distress. Eyes: Pupils react. EOMI. Ears nose mouth and throat:  His mucous membranes are moist. His hearing is grossly normal. Cardiovascular: I don't hear a gallop. He has minimal edema of his legs. Normal heart sounds. Respiratory: He has bilateral rhonchi but no wheezing now. Gastrointestinal: His abdomen is soft obese with no masses. Skin: Warm and dry. Musculoskeletal: Normal strength. Neurological: No focal abnormalities. Psychiatric: Normal mood and affect  Lab Results: Basic Metabolic Panel:  Recent Labs  06/11/16 0533 06/12/16 0509  NA 137 137  K 4.6 4.5  CL 99* 96*  CO2 30 30  GLUCOSE 150* 176*  BUN 30* 32*  CREATININE 2.34* 2.03*  CALCIUM 9.7 9.9   Liver Function Tests: No results for input(s): AST, ALT, ALKPHOS, BILITOT, PROT, ALBUMIN in the last 72 hours. No results for input(s): LIPASE, AMYLASE in the last 72 hours. No results for input(s): AMMONIA in the  last 72 hours. CBC: No results for input(s): WBC, NEUTROABS, HGB, HCT, MCV, PLT in the last 72 hours. Cardiac Enzymes: No results for input(s): CKTOTAL, CKMB, CKMBINDEX, TROPONINI in the last 72 hours. BNP: No results for input(s): PROBNP in the last 72 hours. D-Dimer: No results for input(s): DDIMER in the last 72 hours. CBG:  Recent Labs  06/10/16 2131 06/11/16 0743 06/11/16 1218 06/11/16 1715 06/11/16 2038 06/12/16 0731  GLUCAP 201* 137* 214* 289* 280* 158*   Hemoglobin A1C: No results for input(s): HGBA1C in the last 72 hours. Fasting Lipid Panel: No results for input(s): CHOL, HDL, LDLCALC, TRIG, CHOLHDL, LDLDIRECT in the last 72 hours. Thyroid Function Tests: No results for input(s): TSH, T4TOTAL, FREET4, T3FREE, THYROIDAB in the last 72 hours. Anemia Panel: No results for input(s): VITAMINB12, FOLATE, FERRITIN, TIBC, IRON, RETICCTPCT in the last 72 hours. Coagulation: No results for input(s): LABPROT, INR in the last 72 hours. Urine Drug Screen: Drugs of Abuse  No results found for: LABOPIA, COCAINSCRNUR, LABBENZ, AMPHETMU, THCU, LABBARB  Alcohol Level: No results for input(s): ETH in the last 72 hours. Urinalysis: No results for input(s): COLORURINE, LABSPEC, PHURINE, GLUCOSEU, HGBUR, BILIRUBINUR, KETONESUR, PROTEINUR, UROBILINOGEN, NITRITE, LEUKOCYTESUR in the last 72 hours.  Invalid input(s): APPERANCEUR Misc. Labs:   ABGS: No results for input(s): PHART, PO2ART, TCO2, HCO3 in the last 72 hours.  Invalid input(s): PCO2   MICROBIOLOGY: Recent Results (from the past 240 hour(s))  Blood culture (routine x 2)     Status: None (Preliminary result)   Collection Time: 06/08/16 12:37 PM  Result Value Ref Range Status   Specimen Description LEFT ANTECUBITAL  Final   Special Requests   Final    BOTTLES DRAWN AEROBIC AND ANAEROBIC Blood Culture adequate volume   Culture NO GROWTH 3 DAYS  Final   Report Status PENDING  Incomplete  Blood culture (routine x 2)      Status: None (Preliminary result)   Collection Time: 06/08/16  1:04 PM  Result Value Ref Range Status   Specimen Description RIGHT ANTECUBITAL  Final   Special Requests   Final    BOTTLES DRAWN AEROBIC AND ANAEROBIC Blood Culture adequate volume   Culture NO GROWTH 3 DAYS  Final   Report Status PENDING  Incomplete    Studies/Results: Ct Abdomen Wo Contrast  Result Date: 06/10/2016 CLINICAL DATA:  Productive cough, renal insufficiency, dysphagia. EXAM: CT CHEST AND ABDOMEN WITHOUT CONTRAST TECHNIQUE: Multidetector CT imaging of the chest and abdomen was performed following the standard protocol without intravenous contrast. COMPARISON:  CT scan of January 11, 2015. FINDINGS: CT CHEST FINDINGS WITHOUT CONTRAST Cardiovascular: Atherosclerosis of thoracic aorta is noted without  aneurysm formation. Status post coronary artery bypass graft. No pericardial effusion is noted. Mediastinum/Nodes: No enlarged mediastinal or axillary lymph nodes. Thyroid gland, trachea, and esophagus demonstrate no significant findings. Lungs/Pleura: No pneumothorax is noted. Minimal right basilar subsegmental atelectasis is noted. Mild left pleural effusion is noted with adjacent pneumonia or subsegmental atelectasis. Musculoskeletal: No chest wall mass or suspicious bone lesions identified. CT ABDOMEN FINDINGS WITHOUT CONTRAST Hepatobiliary: Possible minimal cholelithiasis is noted without inflammation. No abnormality seen in the liver on these unenhanced images. Pancreas: Unremarkable. No pancreatic ductal dilatation or surrounding inflammatory changes. Spleen: Normal in size without focal abnormality. Adrenals/Urinary Tract: Adrenal glands and kidneys appear normal. No hydronephrosis or renal obstruction is noted. No renal calculi are noted. Stomach/Bowel: No evidence of bowel dilatation or obstruction is noted. Visualized portion of appendix appears normal per stable 2.1 cm lipoma is seen within the distal portion of the  stomach. Vascular/Lymphatic: Aortic atherosclerosis. No enlarged abdominal or pelvic lymph nodes. Other: No hernia or abnormal fluid collection is noted. Musculoskeletal: No acute or significant osseous findings. IMPRESSION: Aortic atherosclerosis. Mild left posterior basilar opacity is noted concerning for atelectasis or pneumonia with small associated pleural effusion. Possible minimal cholelithiasis. 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 Electronically Signed   By: Marijo Conception, M.D.   On: 06/10/2016 17:53   Ct Chest Wo Contrast  Result Date: 06/10/2016 CLINICAL DATA:  Productive cough, renal insufficiency, dysphagia. EXAM: CT CHEST AND ABDOMEN WITHOUT CONTRAST TECHNIQUE: Multidetector CT imaging of the chest and abdomen was performed following the standard protocol without intravenous contrast. COMPARISON:  CT scan of January 11, 2015. FINDINGS: CT CHEST FINDINGS WITHOUT CONTRAST Cardiovascular: Atherosclerosis of thoracic aorta is noted without aneurysm formation. Status post coronary artery bypass graft. No pericardial effusion is noted. Mediastinum/Nodes: No enlarged mediastinal or axillary lymph nodes. Thyroid gland, trachea, and esophagus demonstrate no significant findings. Lungs/Pleura: No pneumothorax is noted. Minimal right basilar subsegmental atelectasis is noted. Mild left pleural effusion is noted with adjacent pneumonia or subsegmental atelectasis. Musculoskeletal: No chest wall mass or suspicious bone lesions identified. CT ABDOMEN FINDINGS WITHOUT CONTRAST Hepatobiliary: Possible minimal cholelithiasis is noted without inflammation. No abnormality seen in the liver on these unenhanced images. Pancreas: Unremarkable. No pancreatic ductal dilatation or surrounding inflammatory changes. Spleen: Normal in size without focal abnormality. Adrenals/Urinary Tract: Adrenal glands and kidneys appear normal. No hydronephrosis or renal  obstruction is noted. No renal calculi are noted. Stomach/Bowel: No evidence of bowel dilatation or obstruction is noted. 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 Vascular/Lymphatic: Aortic atherosclerosis. No enlarged abdominal or pelvic lymph nodes. Other: No hernia or abnormal fluid collection is noted. Musculoskeletal: No acute or significant osseous findings. IMPRESSION: Aortic atherosclerosis. Mild left posterior basilar opacity is noted concerning for atelectasis or pneumonia with small associated pleural effusion. Possible minimal cholelithiasis. 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 Electronically Signed   By: Marijo Conception, M.D.   On: 06/10/2016 17:53    Medications:  Prior to Admission:  Prescriptions Prior to Admission  Medication Sig Dispense Refill Last Dose  . amLODipine-olmesartan (AZOR) 5-40 MG tablet Take 1 tablet by mouth daily.   Past Week at Unknown time  . apixaban (ELIQUIS) 5 MG TABS tablet Take 5 mg by mouth 2 (two) times daily.   Past Week at Unknown  time  . cholecalciferol (VITAMIN D) 1000 units tablet Take 5,000 Units by mouth daily.   Past Week at Unknown time  . cloNIDine (CATAPRES) 0.1 MG tablet Take 0.1 mg by mouth 2 (two) times daily.   Past Week at Unknown time  . docusate sodium (COLACE) 100 MG capsule Take 100 mg by mouth 2 (two) times daily.   Past Week at Unknown time  . furosemide (LASIX) 20 MG tablet Take 20 mg by mouth 2 (two) times daily.   Past Week at Unknown time  . hydrocortisone cream 1 % Apply 1 application topically 2 (two) times daily.   Past Week at Unknown time  . Insulin Glargine (LANTUS SOLOSTAR) 100 UNIT/ML Solostar Pen Inject 45 Units into the skin daily at 6 PM.    06/07/2016 at Unknown time  . insulin lispro (HUMALOG) 100 UNIT/ML injection Inject into the skin. Per sliding scale   Past Week at Unknown time  .  ipratropium-albuterol (DUONEB) 0.5-2.5 (3) MG/3ML SOLN Take 3 mLs by nebulization every 6 (six) hours as needed (wheezing).   06/07/2016 at Unknown time  . meclizine (ANTIVERT) 12.5 MG tablet Take 12.5 mg by mouth 2 (two) times daily.   Past Week at Unknown time  . metoprolol succinate (TOPROL-XL) 50 MG 24 hr tablet Take 50 mg by mouth daily. Take with or immediately following a meal.   Past Week at Unknown time  . nitroGLYCERIN (NITROSTAT) 0.4 MG SL tablet Place 1 tablet (0.4 mg total) under the tongue every 5 (five) minutes as needed for chest pain. 30 tablet 12 Past Month at Unknown time  . oxyCODONE (ROXICODONE) 15 MG immediate release tablet Take 15 mg by mouth 4 (four) times daily as needed for pain.   06/07/2016 at Unknown time  . pantoprazole (PROTONIX) 40 MG tablet Take 40 mg by mouth daily.   Past Week at Unknown time  . potassium chloride (KLOR-CON) 8 MEQ tablet Take 1 tablet (8 mEq total) by mouth daily. 30 tablet 2 Past Week at Unknown time  . prednisoLONE acetate (PRED FORTE) 1 % ophthalmic suspension Place 1 drop into the left eye 4 (four) times daily.   Past Week at Unknown time  . pregabalin (LYRICA) 50 MG capsule Take 50 mg by mouth daily.   Past Week at Unknown time  . rosuvastatin (CRESTOR) 10 MG tablet Take 10 mg by mouth daily.   Past Week at Unknown time  . silodosin (RAPAFLO) 8 MG CAPS capsule Take 8 mg by mouth daily with breakfast.   Past Week at Unknown time  . timolol (BETIMOL) 0.5 % ophthalmic solution Place 1 drop into the left eye daily.   Past Week at Unknown time  . levofloxacin (LEVAQUIN) 500 MG tablet Take 1 tablet (500 mg total) by mouth daily. 7 tablet 0    Scheduled: . feeding supplement (GLUCERNA SHAKE)  237 mL Oral TID BM  . insulin aspart  0-15 Units Subcutaneous TID WC  . insulin aspart  0-5 Units Subcutaneous QHS  . insulin glargine  30 Units Subcutaneous QHS  . ipratropium-albuterol  3 mL Nebulization QID  . mouth rinse  15 mL Mouth Rinse BID  . metoprolol  succinate  75 mg Oral Daily  . pantoprazole  40 mg Oral BID AC  . potassium chloride  10 mEq Oral Daily  . prednisoLONE acetate  1 drop Left Eye QID  . rosuvastatin  10 mg Oral Daily  . sodium chloride flush  3 mL Intravenous Q12H  Continuous: . sodium chloride 250 mL (06/08/16 1901)  . azithromycin Stopped (06/11/16 1827)  . cefTRIAXone (ROCEPHIN)  IV Stopped (06/11/16 2111)   LPN:PYYFRT chloride, acetaminophen **OR** acetaminophen, LORazepam, ondansetron (ZOFRAN) IV, oxyCODONE, sodium chloride flush  Assesment: He was admitted with shortness of breath felt to be due to heart failure. He's being treated for that. He has some element of COPD as well. Chest x-ray showed small area that may be pneumonia for which she is being treated. He has diabetes. I think he needs steroids despite his history of diabetes and I will initiate prednisone. I think we should see if he's had blood clots although I don't think that's very likely and I ordered a lung scan. He will need outpatient pulmonary function testing Principal Problem:   Dyspnea Active Problems:   Essential hypertension   Diabetes (HCC)   Acute on chronic diastolic CHF (congestive heart failure) (HCC)   CAD in native artery   Chest pain   Persistent atrial fibrillation (HCC)   CKD (chronic kidney disease), stage III   Esophageal stricture   Atrial fibrillation with normal ventricular rate (HCC)   Pressure injury of skin    Plan: Add prednisone. Check lung scan. Continue other treatments.    LOS: 4 days   Harvey Lingo L 06/12/2016, 9:05 AM

## 2016-06-12 NOTE — Progress Notes (Signed)
Patient ID: [name], male   DOB: 04-Jul-1931, 83 y.o.   MRN: [id number]   Assessment/Plan: ADMITTED WITH CHF/COPD EXACERBATION. ARRIVED TO [facility] FOR EGD IN MODERATE RESPIRATORY DISTRESS. NEEDS EGD FOR DYSPHAGIA  PLAN: 1. [place] EGD WITH MAC ON MON JUN 11. 2. BID PPI 3. DYSPHAGIA 2 DIET AND ADVANCE TO DYSPHAGIA 3 IF TOLERATES DYSPHAGIA 2 4. Hold ELIQUIS IF PT NEEDS EGD/DIL IT WILL NEED TO BE HELD FOR 3 DAYS.   Subjective: Since I last evaluated the patient he remains short of breath.   Objective: Vital signs in last 24 hours: Vitals:   06/12/16 0754 06/12/16 0800  BP:  (!) 149/82  Pulse:    Resp: 20 (!) 55  Temp: 97.9 F (36.6 C)    General appearance: alert, cooperative and mild distress Resp: diminished breath sounds bibasilar, POOR AIR MOVEMENT BILATERALLY. END EXPIRATORY WHEEZES Cardio: irregularly irregular rhythm GI: soft, non-tender; bowel sounds normal;   Lab Results:  Cr 2.03 K 4.5   Studies/Results: No results found.  Medications: I have reviewed the patient's current medications. LAST DOSE OF ELIQUIS Jun 15 1298.   LOS: 5 days   Barney Drain 06/15/2013, 2:23 PM

## 2016-06-12 NOTE — Progress Notes (Signed)
Appreciate pulmonary and GI as well as cardiology notes EGD and possible dilatation of esophagus canceled due to sputum production as well as dyspnea will resume Ellik was 2.5 by mouth twice a day due to chronic atrial fibrillation and delayed procedure patient currently being treated with 3 antibiotics since admission DuoNeb nebulizer on board prednisone added by pulmonary today [name] ASN:[id number] DOB: 07/27/1931 DOA: 06/08/2016 PCP: [name], MD   Physical Exam: Blood pressure (!) 154/79, pulse 92, temperature 97.9 F (36.6 C), temperature source Oral, resp. rate (!) 55, height 5\' 10"  (1.778 m), weight 99.1 kg (218 lb 7.6 oz), SpO2 95 %. Lungs diminished breath sounds in the bases prolonged expiratory phase no rales no wheezes no rhonchi appreciable heart irregular rhythm no S3 auscultated no heaves thrills rubs leg showed no evidence of DVT at present   Investigations:  Recent Results (from the past 240 hour(s))  Blood culture (routine x 2)     Status: None (Preliminary result)   Collection Time: 06/08/16 12:37 PM  Result Value Ref Range Status   Specimen Description LEFT ANTECUBITAL  Final   Special Requests   Final    BOTTLES DRAWN AEROBIC AND ANAEROBIC Blood Culture adequate volume   Culture NO GROWTH 4 DAYS  Final   Report Status PENDING  Incomplete  Blood culture (routine x 2)     Status: None (Preliminary result)   Collection Time: 06/08/16  1:04 PM  Result Value Ref Range Status   Specimen Description RIGHT ANTECUBITAL  Final   Special Requests   Final    BOTTLES DRAWN AEROBIC AND ANAEROBIC Blood Culture adequate volume   Culture NO GROWTH 4 DAYS  Final   Report Status PENDING  Incomplete     Basic Metabolic Panel:  Recent Labs  06/11/16 0533 06/12/16 0509  NA 137 137  K 4.6 4.5  CL 99* 96*  CO2 30 30  GLUCOSE 150* 176*  BUN 30* 32*  CREATININE 2.34* 2.03*  CALCIUM 9.7 9.9   Liver Function Tests: No results for input(s): AST, ALT, ALKPHOS,  BILITOT, PROT, ALBUMIN in the last 72 hours.   CBC: No results for input(s): WBC, NEUTROABS, HGB, HCT, MCV, PLT in the last 72 hours.  Ct Abdomen Wo Contrast  Result Date: 06/10/2016 CLINICAL DATA:  Productive cough, renal insufficiency, dysphagia. EXAM: CT CHEST AND ABDOMEN WITHOUT CONTRAST TECHNIQUE: Multidetector CT imaging of the chest and abdomen was performed following the standard protocol without intravenous contrast. COMPARISON:  CT scan of January 11, 2015. FINDINGS: CT CHEST FINDINGS WITHOUT CONTRAST Cardiovascular: Atherosclerosis of thoracic aorta is noted without aneurysm formation. Status post coronary artery bypass graft. No pericardial effusion is noted. Mediastinum/Nodes: No enlarged mediastinal or axillary lymph nodes. Thyroid gland, trachea, and esophagus demonstrate no significant findings. Lungs/Pleura: No pneumothorax is noted. Minimal right basilar subsegmental atelectasis is noted. Mild left pleural effusion is noted with adjacent pneumonia or subsegmental atelectasis. Musculoskeletal: No chest wall mass or suspicious bone lesions identified. CT ABDOMEN FINDINGS WITHOUT CONTRAST Hepatobiliary: Possible minimal cholelithiasis is noted without inflammation. No abnormality seen in the liver on these unenhanced images. Pancreas: Unremarkable. No pancreatic ductal dilatation or surrounding inflammatory changes. Spleen: Normal in size without focal abnormality. Adrenals/Urinary Tract: Adrenal glands and kidneys appear normal. No hydronephrosis or renal obstruction is noted. No renal calculi are noted. Stomach/Bowel: No evidence of bowel dilatation or obstruction is noted. 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  Vascular/Lymphatic: Aortic atherosclerosis. No enlarged abdominal or pelvic lymph nodes. Other: No hernia or abnormal fluid collection is noted. Musculoskeletal: No acute or significant osseous findings.  IMPRESSION: Aortic atherosclerosis. Mild left posterior basilar opacity is noted concerning for atelectasis or pneumonia with small associated pleural effusion. Possible minimal cholelithiasis. 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 Electronically Signed   By: Marijo Conception, M.D.   On: 06/10/2016 17:53   Ct Chest Wo Contrast  Result Date: 06/10/2016 CLINICAL DATA:  Productive cough, renal insufficiency, dysphagia. EXAM: CT CHEST AND ABDOMEN WITHOUT CONTRAST TECHNIQUE: Multidetector CT imaging of the chest and abdomen was performed following the standard protocol without intravenous contrast. COMPARISON:  CT scan of January 11, 2015. FINDINGS: CT CHEST FINDINGS WITHOUT CONTRAST Cardiovascular: Atherosclerosis of thoracic aorta is noted without aneurysm formation. Status post coronary artery bypass graft. No pericardial effusion is noted. Mediastinum/Nodes: No enlarged mediastinal or axillary lymph nodes. Thyroid gland, trachea, and esophagus demonstrate no significant findings. Lungs/Pleura: No pneumothorax is noted. Minimal right basilar subsegmental atelectasis is noted. Mild left pleural effusion is noted with adjacent pneumonia or subsegmental atelectasis. Musculoskeletal: No chest wall mass or suspicious bone lesions identified. CT ABDOMEN FINDINGS WITHOUT CONTRAST Hepatobiliary: Possible minimal cholelithiasis is noted without inflammation. No abnormality seen in the liver on these unenhanced images. Pancreas: Unremarkable. No pancreatic ductal dilatation or surrounding inflammatory changes. Spleen: Normal in size without focal abnormality. Adrenals/Urinary Tract: Adrenal glands and kidneys appear normal. No hydronephrosis or renal obstruction is noted. No renal calculi are noted. Stomach/Bowel: No evidence of bowel dilatation or obstruction is noted. Visualized portion of appendix appears normal per stable 2.1 cm lipoma is seen within the distal  portion of the stomach. Vascular/Lymphatic: Aortic atherosclerosis. No enlarged abdominal or pelvic lymph nodes. Other: No hernia or abnormal fluid collection is noted. Musculoskeletal: No acute or significant osseous findings. IMPRESSION: Aortic atherosclerosis. Mild left posterior basilar opacity is noted concerning for atelectasis or pneumonia with small associated pleural effusion. Possible minimal cholelithiasis. 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 Electronically Signed   By: Marijo Conception, M.D.   On: 06/10/2016 17:53      Medications:   Impression:  Principal Problem:   Dyspnea Active Problems:   Essential hypertension   Diabetes (HCC)   Acute on chronic diastolic CHF (congestive heart failure) (HCC)   CAD in native artery   Chest pain   Persistent atrial fibrillation (HCC)   CKD (chronic kidney disease), stage III   Esophageal stricture   Atrial fibrillation with normal ventricular rate (HCC)   Pressure injury of skin     Plan: Hold Lasix monitor renal function. Resume his 2.5 by mouth twice a day. We'll await results of CT angiogram. Carb modified diet. Prednisone ordered we will monitor glucose for sliding scale.  Consultants: Gastroenterology, cardiology, pulmonary   Procedures   Antibiotics: Vancomycin and Rocephin and Zithromax          Time spent: 30 minutes   LOS: 4 days   Jolly Carlini M   06/12/2016, 12:50 PM

## 2016-06-12 NOTE — Care Management Important Message (Signed)
Important Message  Patient Details  Name: [name] MRN: [id number] Date of Birth: Jun 26, 1931   Medicare Important Message Given:  Yes    Sherald Barge, RN 06/12/2016, 9:58 AM

## 2016-06-12 NOTE — Anesthesia Preprocedure Evaluation (Addendum)
Anesthesia Evaluation  Patient identified by MRN, date of birth, ID band Patient awake    Reviewed: Allergy & Precautions, H&P , NPO status , Patient's Chart, lab work & pertinent test results  Airway Mallampati: II  TM Distance: >3 FB Neck ROM: Full    Dental  (+) Edentulous Upper, Edentulous Lower   Pulmonary shortness of breath, sleep apnea and Continuous Positive Airway Pressure Ventilation , pneumonia (L basilar infiltrate, L plueral effusion on CT, active prod cough), COPD, former smoker,   Prod cough, RA sat 90% Pulmonary exam normal + rhonchi        Cardiovascular hypertension, Pt. on medications + CAD, + CABG, +CHF and + DVT  negative cardio ROS  + dysrhythmias Atrial Fibrillation  Rhythm:Regular Rate:Normal     Neuro/Psych negative neurological ROS  negative psych ROS   GI/Hepatic Neg liver ROS, GERD  Medicated,  Endo/Other  diabetes, Type 2, Insulin Dependent  Renal/GU Renal InsufficiencyRenal diseasenegative Renal ROS     Musculoskeletal negative musculoskeletal ROS (+)   Abdominal   Peds  Hematology negative hematology ROS (+)   Anesthesia Other Findings   Reproductive/Obstetrics                           Anesthesia Physical Anesthesia Plan  ASA: III  Anesthesia Plan: MAC   Post-op Pain Management:    Induction: Intravenous  PONV Risk Score and Plan:   Airway Management Planned: Simple Face Mask  Additional Equipment:   Intra-op Plan:   Post-operative Plan:   Informed Consent: I have reviewed the patients History and Physical, chart, labs and discussed the procedure including the risks, benefits and alternatives for the proposed anesthesia with the patient or authorized representative who has indicated his/her understanding and acceptance.     Plan Discussed with: Surgeon  Anesthesia Plan Comments: (Despite Duoneb treatment in Preop holding, pt is still SOB  even while sitting upright.  Doubt we will be able to sedate for EGD and maintain his oxygenation in his present condition.  Spoke with GI about delaying until improvement in breathing allows [place] to sedate this pt safely.)       Anesthesia Quick Evaluation

## 2016-06-12 NOTE — OR Nursing (Signed)
Dr. Luan Pulling in to see patient prior to planned egd.  Duoneb  Given to patient this morning in preop.  Procedure cancelled and report called Val RN  On  300.

## 2016-06-12 NOTE — Care Management (Signed)
DC plan continues to be for pt to return home with [facility] PT. Pt unable to have EGD done today due to resp status. Pt will need to wean from oxygen or have home O2 assessment prior to DC. Discharge over weekend not anticipated.

## 2016-06-12 NOTE — Progress Notes (Signed)
Inpatient Diabetes Program Recommendations  AACE/ADA: New Consensus Statement on Inpatient Glycemic Control (2015)  Target Ranges:  Prepandial:   less than 140 mg/dL      Peak postprandial:   less than 180 mg/dL (1-2 hours)      Critically ill patients:  140 - 180 mg/dL  Results for [name], [name] (MRN [id number]) as of 06/12/2016 10:24  Ref. Range 06/11/2016 07:43 06/11/2016 12:18 06/11/2016 17:15 06/11/2016 20:38 06/12/2016 07:31  Glucose-Capillary Latest Ref Range: 65 - 99 mg/dL 137 (H) 214 (H) 289 (H) 280 (H) 158 (H)  Results for [name], [name] (MRN [id number]) as of 06/12/2016 10:24  Ref. Range 06/10/2016 08:01 06/10/2016 12:11 06/10/2016 17:23 06/10/2016 21:31  Glucose-Capillary Latest Ref Range: 65 - 99 mg/dL 188 (H) 195 (H) 201 (H) 201 (H)    Review of Glycemic Control  Diabetes history: DM2 Outpatient Diabetes medications: Lantus 45 units daily at 6PM, Humalog per sliding scale Current orders for Inpatient glycemic control: Lantus 30 units QHS, Novolog 0-15 units TID with meals, Novolog 0-5 units QHS  Inpatient Diabetes Program Recommendations: Insulin - Meal Coverage: Noted glucose consistently elevated after Glucerna intake and patient is being started on Prednisone today. Please consider ordering Novolog 4 units TID with Glucerna (note poor PO intake with diet).  Thanks, Barnie Alderman, RN, MSN, CDE Diabetes Coordinator Inpatient Diabetes Program [phone] (Team Pager from 8am to 5pm)

## 2016-06-12 NOTE — Progress Notes (Signed)
Progress Note  Patient Name: [name] Date of Encounter: 06/12/2016  Primary Cardiologist: Dr. Satira Sark  Subjective   Appears less short of breath, still with intermittent cough. No palpitations. Tells me that EGD was canceled this morning given concerns about pulmonary status.  Inpatient Medications    Scheduled Meds: . feeding supplement (GLUCERNA SHAKE)  237 mL Oral TID BM  . insulin aspart  0-15 Units Subcutaneous TID WC  . insulin aspart  0-5 Units Subcutaneous QHS  . insulin glargine  30 Units Subcutaneous QHS  . ipratropium-albuterol  3 mL Nebulization QID  . mouth rinse  15 mL Mouth Rinse BID  . metoprolol succinate  75 mg Oral Daily  . pantoprazole  40 mg Oral BID AC  . potassium chloride  10 mEq Oral Daily  . prednisoLONE acetate  1 drop Left Eye QID  . predniSONE  40 mg Oral Q breakfast  . rosuvastatin  10 mg Oral Daily  . sodium chloride flush  3 mL Intravenous Q12H   Continuous Infusions: . sodium chloride 250 mL (06/08/16 1901)  . azithromycin Stopped (06/11/16 1827)  . cefTRIAXone (ROCEPHIN)  IV Stopped (06/11/16 2111)   PRN Meds: sodium chloride, acetaminophen **OR** acetaminophen, LORazepam, ondansetron (ZOFRAN) IV, oxyCODONE, sodium chloride flush   Vital Signs    Vitals:   06/11/16 2028 06/11/16 2251 06/12/16 0754 06/12/16 0800  BP:  124/70  (!) 149/82  Pulse:  75    Resp:  20 20 (!) 55  Temp:  98.2 F (36.8 C) 97.9 F (36.6 C)   TempSrc:  Oral Oral   SpO2: 92% 92% 96% 95%  Weight:      Height:        Intake/Output Summary (Last 24 hours) at 06/12/16 1038 Last data filed at 06/12/16 0744  Gross per 24 hour  Intake              189 ml  Output              776 ml  Net             -587 ml   Filed Weights   06/09/16 0652 06/10/16 0628 06/11/16 0548  Weight: 216 lb 4.8 oz (98.1 kg) 216 lb 4.7 oz (98.1 kg) 218 lb 7.6 oz (99.1 kg)    Telemetry    Atrial fibrillation, bradycardia noted in early morning hours. Personally  reviewed.  Physical Exam   GEN: Obese male. No acute distress.   Neck: No JVD. Cardiac:  Irregularly irregular, no gallop.  Respiratory:  Mildly prolonged expiratory phase and scattered rhonchi.. GI:  Obese, nontender, bowel sounds present. MS: No edema; No deformity.  Labs    Chemistry Recent Labs Lab 06/08/16 1133  06/10/16 0502 06/11/16 0533 06/12/16 0509  NA  --   < > 136 137 137  K  --   < > 4.8 4.6 4.5  CL  --   < > 99* 99* 96*  CO2  --   < > 28 30 30   GLUCOSE  --   < > 178* 150* 176*  BUN  --   < > 27* 30* 32*  CREATININE  --   < > 2.04* 2.34* 2.03*  CALCIUM  --   < > 9.7 9.7 9.9  PROT 7.1  --   --   --   --   ALBUMIN 3.1*  --   --   --   --   AST 15  --   --   --   --  ALT 19  --   --   --   --   ALKPHOS 171*  --   --   --   --   BILITOT 0.7  --   --   --   --   GFRNONAA  --   < > 29* 24* 29*  GFRAA  --   < > 33* 28* 33*  ANIONGAP  --   < > 9 8 11   < > = values in this interval not displayed.   Hematology  Recent Labs Lab 06/08/16 1037 06/09/16 0330  WBC 13.2* 10.8*  RBC 4.08* 3.87*  HGB 11.5* 10.9*  HCT 35.8* 34.3*  MCV 87.7 88.6  MCH 28.2 28.2  MCHC 32.1 31.8  RDW 14.4 14.3  PLT 323 305    Cardiac Enzymes  Recent Labs Lab 06/08/16 1037 06/08/16 1704 06/08/16 2106 06/09/16 0330  TROPONINI <0.03 <0.03 <0.03 <0.03   No results for input(s): TROPIPOC in the last 168 hours.   BNP  Recent Labs Lab 06/08/16 1133  BNP 1,759.0*     Radiology    Ct Abdomen Wo Contrast  Result Date: 06/10/2016 CLINICAL DATA:  Productive cough, renal insufficiency, dysphagia. EXAM: CT CHEST AND ABDOMEN WITHOUT CONTRAST TECHNIQUE: Multidetector CT imaging of the chest and abdomen was performed following the standard protocol without intravenous contrast. COMPARISON:  CT scan of January 11, 2015. FINDINGS: CT CHEST FINDINGS WITHOUT CONTRAST Cardiovascular: Atherosclerosis of thoracic aorta is noted without aneurysm formation. Status post coronary artery bypass  graft. No pericardial effusion is noted. Mediastinum/Nodes: No enlarged mediastinal or axillary lymph nodes. Thyroid gland, trachea, and esophagus demonstrate no significant findings. Lungs/Pleura: No pneumothorax is noted. Minimal right basilar subsegmental atelectasis is noted. Mild left pleural effusion is noted with adjacent pneumonia or subsegmental atelectasis. Musculoskeletal: No chest wall mass or suspicious bone lesions identified. CT ABDOMEN FINDINGS WITHOUT CONTRAST Hepatobiliary: Possible minimal cholelithiasis is noted without inflammation. No abnormality seen in the liver on these unenhanced images. Pancreas: Unremarkable. No pancreatic ductal dilatation or surrounding inflammatory changes. Spleen: Normal in size without focal abnormality. Adrenals/Urinary Tract: Adrenal glands and kidneys appear normal. No hydronephrosis or renal obstruction is noted. No renal calculi are noted. Stomach/Bowel: No evidence of bowel dilatation or obstruction is noted. 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 Vascular/Lymphatic: Aortic atherosclerosis. No enlarged abdominal or pelvic lymph nodes. Other: No hernia or abnormal fluid collection is noted. Musculoskeletal: No acute or significant osseous findings. IMPRESSION: Aortic atherosclerosis. Mild left posterior basilar opacity is noted concerning for atelectasis or pneumonia with small associated pleural effusion. Possible minimal cholelithiasis. 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 Electronically Signed   By: Marijo Conception, M.D.   On: 06/10/2016 17:53   Ct Chest Wo Contrast  Result Date: 06/10/2016 CLINICAL DATA:  Productive cough, renal insufficiency, dysphagia. EXAM: CT CHEST AND ABDOMEN WITHOUT CONTRAST TECHNIQUE: Multidetector CT imaging of the chest and abdomen was performed following the standard protocol without  intravenous contrast. COMPARISON:  CT scan of January 11, 2015. FINDINGS: CT CHEST FINDINGS WITHOUT CONTRAST Cardiovascular: Atherosclerosis of thoracic aorta is noted without aneurysm formation. Status post coronary artery bypass graft. No pericardial effusion is noted. Mediastinum/Nodes: No enlarged mediastinal or axillary lymph nodes. Thyroid gland, trachea, and esophagus demonstrate no significant findings. Lungs/Pleura: No pneumothorax is noted. Minimal right basilar subsegmental atelectasis is noted. Mild left pleural effusion  is noted with adjacent pneumonia or subsegmental atelectasis. Musculoskeletal: No chest wall mass or suspicious bone lesions identified. CT ABDOMEN FINDINGS WITHOUT CONTRAST Hepatobiliary: Possible minimal cholelithiasis is noted without inflammation. No abnormality seen in the liver on these unenhanced images. Pancreas: Unremarkable. No pancreatic ductal dilatation or surrounding inflammatory changes. Spleen: Normal in size without focal abnormality. Adrenals/Urinary Tract: Adrenal glands and kidneys appear normal. No hydronephrosis or renal obstruction is noted. No renal calculi are noted. Stomach/Bowel: No evidence of bowel dilatation or obstruction is noted. 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 Vascular/Lymphatic: Aortic atherosclerosis. No enlarged abdominal or pelvic lymph nodes. Other: No hernia or abnormal fluid collection is noted. Musculoskeletal: No acute or significant osseous findings. IMPRESSION: Aortic atherosclerosis. Mild left posterior basilar opacity is noted concerning for atelectasis or pneumonia with small associated pleural effusion. Possible minimal cholelithiasis. 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 Electronically Signed   By: Marijo Conception, M.D.   On: 06/10/2016 17:53    Cardiac Studies   Echocardiogram  06/09/2016: Study Conclusions  - Left ventricle: The cavity size was normal. Wall thickness was   increased increased in a pattern of mild to moderate LVH.   Systolic function was normal. The estimated ejection fraction was   in the range of 60% to 65%. Wall motion was normal; there were no   regional wall motion abnormalities. The study is not technically   sufficient to allow evaluation of LV diastolic function. - Aortic valve: Mildly calcified annulus. Trileaflet; mildly   thickened, mildly calcified leaflets. - Mitral valve: Calcified annulus. There was mild regurgitation. - Left atrium: The atrium was mildly dilated. - Right atrium: Central venous pressure (est): 8 mm Hg. - Tricuspid valve: There was trivial regurgitation. - Pulmonary arteries: PA peak pressure: 42 mm Hg (S). - Pericardium, extracardiac: A prominent pericardial fat pad was   present.  Impressions:  - Mild to moderate LVH with LVEF 60-65%. Indeterminate diastolic   function. Mild left atrial enlargement. Mildly calcified mitral   annulus with mild mitral regurgitation. Sclerotic aortic valve   without stenosis. Trivial tricuspid regurgitation with PASP   estimated 42 mmHg. Prominent pericardial fat pad noted.  Patient Profile     83 y.o. male with history of multivessel CAD status post previous PCI and ultimately CABG in 8938, chronic diastolic heart failure, paroxysmal to persistent atrial fibrillation, CAD stage III, hypertension, and previous DVT with postphlebitic syndrome. He presents to the hospital with shortness of breath and cough, progressive over the last few months. Does not report any obvious angina. Has been back in atrial fibrillation following cardioversion earlier in the year, duration is uncertain. Cardiac enzymes argue against ACS. Lower chest CT does not show evidence of progressive pneumonia or mass.  Assessment & Plan    1. Persistent atrial fibrillation with CHADSVASC score of 6. Had been  on Eliquis for stroke prophylaxis - held for possible EGD which was canceled. Toprol-XL dose has been increased for heart rate control and LVEF is normal range of 60-65%. At this point plan is to continue strategy of heart rate control and anticoagulation. It is not clear that he noticed a tremendous difference following cardioversion earlier in the year in terms of shortness of breath.  2. History of chronic diastolic heart failure. Lasix was held in light of climbing creatinine. Chest CT with small left pleural effusion.  3. Shortness of breath with intermittently productive cough, prolonged expiratory phase. Follow-up chest CT does not suggest mass or increasing infiltrate. Dr. Luan Pulling has been consulted for Pulmonary input with history of COPD.  4. Significant dysphasia, EGD reportedly canceled this morning.  5. CAD status post CABG in 2011. Cardiac markers are negative arguing against ACS.  6. CKD stage 3, creatinine down from 2.3-2.0 after Lasix held.  Consider resuming Eliquis at 2.5 mg twice daily if EGD is not going to be pursued at this point. Dr. Luan Pulling now consulted to help assist with pulmonary management. Would hold Lasix again today in light of improving renal function. Continue Toprol-XL and Crestor. Not certain that changing from Toprol-XL to bisoprolol would be beneficial from a pulmonary perspective, but I would not be opposed to this if bronchospasm is felt to be a significant component of the patient's shortness of breath.  Signed, Rozann Lesches, MD  06/12/2016, 10:38 AM

## 2016-06-13 LAB — CULTURE, BLOOD (ROUTINE X 2)
CULTURE: NO GROWTH
CULTURE: NO GROWTH
SPECIAL REQUESTS: ADEQUATE
Special Requests: ADEQUATE

## 2016-06-13 LAB — GLUCOSE, CAPILLARY
GLUCOSE-CAPILLARY: 333 mg/dL — AB (ref 65–99)
GLUCOSE-CAPILLARY: 349 mg/dL — AB (ref 65–99)
GLUCOSE-CAPILLARY: 434 mg/dL — AB (ref 65–99)
GLUCOSE-CAPILLARY: 458 mg/dL — AB (ref 65–99)
GLUCOSE-CAPILLARY: 466 mg/dL — AB (ref 65–99)
Glucose-Capillary: 398 mg/dL — ABNORMAL HIGH (ref 65–99)
Glucose-Capillary: 468 mg/dL — ABNORMAL HIGH (ref 65–99)
Glucose-Capillary: 519 mg/dL (ref 65–99)
Glucose-Capillary: >600 mg/dL (ref 65–99)

## 2016-06-13 LAB — BASIC METABOLIC PANEL
Anion gap: 11 (ref 5–15)
Anion gap: 16 — ABNORMAL HIGH (ref 5–15)
BUN: 32 mg/dL — AB (ref 6–20)
BUN: 35 mg/dL — AB (ref 6–20)
CHLORIDE: 94 mmol/L — AB (ref 101–111)
CHLORIDE: 92 mmol/L — AB (ref 101–111)
CO2: 29 mmol/L (ref 22–32)
CO2: 25 mmol/L (ref 22–32)
CREATININE: 1.92 mg/dL — AB (ref 0.61–1.24)
Calcium: 10.2 mg/dL (ref 8.9–10.3)
Calcium: 10.4 mg/dL — ABNORMAL HIGH (ref 8.9–10.3)
Creatinine, Ser: 1.65 mg/dL — ABNORMAL HIGH (ref 0.61–1.24)
GFR calc Af Amer: 43 mL/min — ABNORMAL LOW (ref >60)
GFR calc Af Amer: 36 mL/min — ABNORMAL LOW (ref >60)
GFR calc non Af Amer: 37 mL/min — ABNORMAL LOW (ref >60)
GFR calc non Af Amer: 31 mL/min — ABNORMAL LOW (ref >60)
GLUCOSE: 379 mg/dL — AB (ref 65–99)
GLUCOSE: 576 mg/dL — AB (ref 65–99)
POTASSIUM: 4.9 mmol/L (ref 3.5–5.1)
POTASSIUM: 5.4 mmol/L — AB (ref 3.5–5.1)
SODIUM: 134 mmol/L — AB (ref 135–145)
SODIUM: 133 mmol/L — AB (ref 135–145)

## 2016-06-13 MED ORDER — SODIUM CHLORIDE 0.9 % IV SOLN
INTRAVENOUS | Status: DC
  Administered 2016-06-13 – 2016-06-15 (×3): via INTRAVENOUS

## 2016-06-13 MED ORDER — GUAIFENESIN ER 600 MG PO TB12
1200.0000 mg | ORAL_TABLET | Freq: Two times a day (BID) | ORAL | Status: DC
  Administered 2016-06-13 – 2016-06-15 (×5): 1200 mg via ORAL
  Filled 2016-06-13 (×5): qty 2

## 2016-06-13 MED ORDER — INSULIN ASPART 100 UNIT/ML ~~LOC~~ SOLN
25.0000 [IU] | Freq: Once | SUBCUTANEOUS | Status: AC
  Administered 2016-06-13: 25 [IU] via SUBCUTANEOUS

## 2016-06-13 MED ORDER — SODIUM CHLORIDE 0.9 % IV SOLN
INTRAVENOUS | Status: AC
  Filled 2016-06-13: qty 1

## 2016-06-13 MED ORDER — FLUTICASONE FUROATE-VILANTEROL 100-25 MCG/INH IN AEPB
1.0000 | INHALATION_SPRAY | Freq: Every day | RESPIRATORY_TRACT | Status: DC
  Administered 2016-06-13 – 2016-06-15 (×3): 1 via RESPIRATORY_TRACT
  Filled 2016-06-13: qty 28

## 2016-06-13 MED ORDER — INSULIN REGULAR BOLUS VIA INFUSION
0.0000 [IU] | Freq: Three times a day (TID) | INTRAVENOUS | Status: DC
  Administered 2016-06-14: 2.8 [IU] via INTRAVENOUS
  Administered 2016-06-14: 2.3 [IU] via INTRAVENOUS
  Filled 2016-06-13: qty 10

## 2016-06-13 MED ORDER — DEXTROSE-NACL 5-0.45 % IV SOLN
INTRAVENOUS | Status: DC
  Administered 2016-06-14: 04:00:00 via INTRAVENOUS

## 2016-06-13 MED ORDER — DEXTROSE 50 % IV SOLN: 25.0000 mL | INTRAVENOUS | Status: DC | PRN

## 2016-06-13 MED ORDER — INSULIN ASPART 100 UNIT/ML ~~LOC~~ SOLN
0.0000 [IU] | Freq: Three times a day (TID) | SUBCUTANEOUS | Status: DC
  Administered 2016-06-14: 4 [IU] via SUBCUTANEOUS
  Administered 2016-06-14: 15 [IU] via SUBCUTANEOUS

## 2016-06-13 MED ORDER — SODIUM CHLORIDE 0.9 % IV SOLN
INTRAVENOUS | Status: DC
  Administered 2016-06-13: 4.6 [IU]/h via INTRAVENOUS
  Filled 2016-06-13 (×2): qty 1

## 2016-06-13 MED ORDER — INSULIN ASPART 100 UNIT/ML ~~LOC~~ SOLN
10.0000 [IU] | Freq: Once | SUBCUTANEOUS | Status: AC
  Administered 2016-06-13: 10 [IU] via SUBCUTANEOUS

## 2016-06-13 NOTE — Progress Notes (Signed)
Subjective: He says he feels a little better. He has no new complaints. He is still coughing. He is still short of breath. He had perfusion lung scan that I personally reviewed and agree this is very low probability for pulmonary embolism. However he did refuse to ventilation portion of the study.  Objective: Vital signs in last 24 hours: Temp:  [97.7 F (36.5 C)-98.4 F (36.9 C)] 97.7 F (36.5 C) (06/09 0527) Pulse Rate:  [66-104] 93 (06/09 0527) Resp:  [16-20] 18 (06/09 0527) BP: (147-166)/(78-95) 166/83 (06/09 0527) SpO2:  [94 %-99 %] 96 % (06/09 0719) Weight change:  Last BM Date: 06/07/16  Intake/Output from previous day: 06/08 0701 - 06/09 0700 In: -  Out: 150 [Urine:150]  PHYSICAL EXAM General appearance: alert, cooperative, mild distress and morbidly obese Resp: rhonchi bilaterally Cardio: regular rate and rhythm, S1, S2 normal, no murmur, click, rub or gallop GI: soft, non-tender; bowel sounds normal; no masses,  no organomegaly Extremities: extremities normal, atraumatic, no cyanosis or edema Mucous membranes are moist  Lab Results:  Results for orders placed or performed during the hospital encounter of 06/08/16 (from the past 48 hour(s))  Glucose, capillary     Status: Abnormal   Collection Time: 06/11/16 12:18 PM  Result Value Ref Range   Glucose-Capillary 214 (H) 65 - 99 mg/dL  Glucose, capillary     Status: Abnormal   Collection Time: 06/11/16  5:15 PM  Result Value Ref Range   Glucose-Capillary 289 (H) 65 - 99 mg/dL  Glucose, capillary     Status: Abnormal   Collection Time: 06/11/16  8:38 PM  Result Value Ref Range   Glucose-Capillary 280 (H) 65 - 99 mg/dL   Comment 1 Notify RN    Comment 2 Document in Chart   Strep pneumoniae urinary antigen     Status: None   Collection Time: 06/12/16  5:00 AM  Result Value Ref Range   Strep Pneumo Urinary Antigen NEGATIVE NEGATIVE    Comment:        Infection due to S. pneumoniae cannot be absolutely ruled  out since the antigen present may be below the detection limit of the test. Performed at [facility], [address] [place], [place] [address]   Basic metabolic panel     Status: Abnormal   Collection Time: 06/12/16  5:09 AM  Result Value Ref Range   Sodium 137 135 - 145 mmol/L   Potassium 4.5 3.5 - 5.1 mmol/L   Chloride 96 (L) 101 - 111 mmol/L   CO2 30 22 - 32 mmol/L   Glucose, Bld 176 (H) 65 - 99 mg/dL   BUN 32 (H) 6 - 20 mg/dL   Creatinine, Ser 2.03 (H) 0.61 - 1.24 mg/dL   Calcium 9.9 8.9 - 10.3 mg/dL   GFR calc non Af Amer 29 (L) >60 mL/min   GFR calc Af Amer 33 (L) >60 mL/min    Comment: (NOTE) The eGFR has been calculated using the CKD EPI equation. This calculation has not been validated in all clinical situations. eGFR's persistently <60 mL/min signify possible Chronic Kidney Disease.    Anion gap 11 5 - 15  Glucose, capillary     Status: Abnormal   Collection Time: 06/12/16  7:31 AM  Result Value Ref Range   Glucose-Capillary 158 (H) 65 - 99 mg/dL  Glucose, capillary     Status: Abnormal   Collection Time: 06/12/16 11:30 AM  Result Value Ref Range   Glucose-Capillary 194 (H)  65 - 99 mg/dL  Glucose, capillary     Status: Abnormal   Collection Time: 06/12/16  4:59 PM  Result Value Ref Range   Glucose-Capillary 349 (H) 65 - 99 mg/dL   Comment 1 Notify RN   Glucose, capillary     Status: Abnormal   Collection Time: 06/12/16  9:08 PM  Result Value Ref Range   Glucose-Capillary 428 (H) 65 - 99 mg/dL   Comment 1 Notify RN    Comment 2 Document in Chart   Glucose, capillary     Status: Abnormal   Collection Time: 06/12/16 11:49 PM  Result Value Ref Range   Glucose-Capillary 466 (H) 65 - 99 mg/dL   Comment 1 Notify RN    Comment 2 Document in Chart   Glucose, capillary     Status: Abnormal   Collection Time: 06/13/16  2:19 AM  Result Value Ref Range   Glucose-Capillary 434 (H) 65 - 99 mg/dL   Comment 1 Notify RN    Comment 2 Document in Chart    Glucose, capillary     Status: Abnormal   Collection Time: 06/13/16  5:46 AM  Result Value Ref Range   Glucose-Capillary 398 (H) 65 - 99 mg/dL  Basic metabolic panel     Status: Abnormal   Collection Time: 06/13/16  6:51 AM  Result Value Ref Range   Sodium 134 (L) 135 - 145 mmol/L   Potassium 4.9 3.5 - 5.1 mmol/L   Chloride 94 (L) 101 - 111 mmol/L   CO2 29 22 - 32 mmol/L   Glucose, Bld 379 (H) 65 - 99 mg/dL   BUN 32 (H) 6 - 20 mg/dL   Creatinine, Ser 1.65 (H) 0.61 - 1.24 mg/dL   Calcium 10.2 8.9 - 10.3 mg/dL   GFR calc non Af Amer 37 (L) >60 mL/min   GFR calc Af Amer 43 (L) >60 mL/min    Comment: (NOTE) The eGFR has been calculated using the CKD EPI equation. This calculation has not been validated in all clinical situations. eGFR's persistently <60 mL/min signify possible Chronic Kidney Disease.    Anion gap 11 5 - 15  Glucose, capillary     Status: Abnormal   Collection Time: 06/13/16  7:52 AM  Result Value Ref Range   Glucose-Capillary 349 (H) 65 - 99 mg/dL   Comment 1 Notify RN    Comment 2 Document in Chart     ABGS No results for input(s): PHART, PO2ART, TCO2, HCO3 in the last 72 hours.  Invalid input(s): PCO2 CULTURES Recent Results (from the past 240 hour(s))  Blood culture (routine x 2)     Status: None (Preliminary result)   Collection Time: 06/08/16 12:37 PM  Result Value Ref Range Status   Specimen Description LEFT ANTECUBITAL  Final   Special Requests   Final    BOTTLES DRAWN AEROBIC AND ANAEROBIC Blood Culture adequate volume   Culture NO GROWTH 4 DAYS  Final   Report Status PENDING  Incomplete  Blood culture (routine x 2)     Status: None (Preliminary result)   Collection Time: 06/08/16  1:04 PM  Result Value Ref Range Status   Specimen Description RIGHT ANTECUBITAL  Final   Special Requests   Final    BOTTLES DRAWN AEROBIC AND ANAEROBIC Blood Culture adequate volume   Culture NO GROWTH 4 DAYS  Final   Report Status PENDING  Incomplete    Studies/Results: Nm Pulmonary Perfusion  Result Date: 06/12/2016 CLINICAL DATA:  Shortness  of breath. EXAM: NUCLEAR MEDICINE VENTILATION AND PERFUSION SCAN TECHNIQUE: Perfusion images were obtained in multiple projections after intravenous injection of radiopharmaceutical. Patient refused ventilation study. RADIOPHARMACEUTICALS:  4.3 MCi Tc52mMAA IV COMPARISON:  CT 06/10/2016. FINDINGS: Patient refused ventilation portion of the study. No prominent perfusion defects noted. Mild basilar defects mass likely related to atelectasis noted on CT of 06/10/2016 . IMPRESSION: 1.  Patient refused ventilation portion of study. 2. No prominent perfusion defects to suggest pulmonary embolic disease. Minimal defects in the lung bases most likely related to prominent atelectasis noted on prior CT of 06/10/2016 . Electronically Signed   By: [place]  On: 06/12/2016 13:22    Medications:  Prior to Admission:  Prescriptions Prior to Admission  Medication Sig Dispense Refill Last Dose  . amLODipine-olmesartan (AZOR) 5-40 MG tablet Take 1 tablet by mouth daily.   Past Week at Unknown time  . apixaban (ELIQUIS) 5 MG TABS tablet Take 5 mg by mouth 2 (two) times daily.   Past Week at Unknown time  . cholecalciferol (VITAMIN D) 1000 units tablet Take 5,000 Units by mouth daily.   Past Week at Unknown time  . cloNIDine (CATAPRES) 0.1 MG tablet Take 0.1 mg by mouth 2 (two) times daily.   Past Week at Unknown time  . docusate sodium (COLACE) 100 MG capsule Take 100 mg by mouth 2 (two) times daily.   Past Week at Unknown time  . furosemide (LASIX) 20 MG tablet Take 20 mg by mouth 2 (two) times daily.   Past Week at Unknown time  . hydrocortisone cream 1 % Apply 1 application topically 2 (two) times daily.   Past Week at Unknown time  . Insulin Glargine (LANTUS SOLOSTAR) 100 UNIT/ML Solostar Pen Inject 45 Units into the skin daily at 6 PM.    06/07/2016 at Unknown time  . insulin lispro (HUMALOG) 100 UNIT/ML  injection Inject into the skin. Per sliding scale   Past Week at Unknown time  . ipratropium-albuterol (DUONEB) 0.5-2.5 (3) MG/3ML SOLN Take 3 mLs by nebulization every 6 (six) hours as needed (wheezing).   06/07/2016 at Unknown time  . meclizine (ANTIVERT) 12.5 MG tablet Take 12.5 mg by mouth 2 (two) times daily.   Past Week at Unknown time  . metoprolol succinate (TOPROL-XL) 50 MG 24 hr tablet Take 50 mg by mouth daily. Take with or immediately following a meal.   Past Week at Unknown time  . nitroGLYCERIN (NITROSTAT) 0.4 MG SL tablet Place 1 tablet (0.4 mg total) under the tongue every 5 (five) minutes as needed for chest pain. 30 tablet 12 Past Month at Unknown time  . oxyCODONE (ROXICODONE) 15 MG immediate release tablet Take 15 mg by mouth 4 (four) times daily as needed for pain.   06/07/2016 at Unknown time  . pantoprazole (PROTONIX) 40 MG tablet Take 40 mg by mouth daily.   Past Week at Unknown time  . potassium chloride (KLOR-CON) 8 MEQ tablet Take 1 tablet (8 mEq total) by mouth daily. 30 tablet 2 Past Week at Unknown time  . prednisoLONE acetate (PRED FORTE) 1 % ophthalmic suspension Place 1 drop into the left eye 4 (four) times daily.   Past Week at Unknown time  . pregabalin (LYRICA) 50 MG capsule Take 50 mg by mouth daily.   Past Week at Unknown time  . rosuvastatin (CRESTOR) 10 MG tablet Take 10 mg by mouth daily.   Past Week at Unknown time  . silodosin (RAPAFLO) 8  MG CAPS capsule Take 8 mg by mouth daily with breakfast.   Past Week at Unknown time  . timolol (BETIMOL) 0.5 % ophthalmic solution Place 1 drop into the left eye daily.   Past Week at Unknown time  . levofloxacin (LEVAQUIN) 500 MG tablet Take 1 tablet (500 mg total) by mouth daily. 7 tablet 0    Scheduled: . apixaban  2.5 mg Oral BID  . feeding supplement (GLUCERNA SHAKE)  237 mL Oral TID BM  . insulin aspart  0-15 Units Subcutaneous TID WC  . insulin aspart  0-5 Units Subcutaneous QHS  . insulin glargine  30 Units  Subcutaneous QHS  . ipratropium-albuterol  3 mL Nebulization QID  . mouth rinse  15 mL Mouth Rinse BID  . metoprolol succinate  75 mg Oral Daily  . pantoprazole  40 mg Oral BID AC  . prednisoLONE acetate  1 drop Left Eye QID  . predniSONE  40 mg Oral Q breakfast  . rosuvastatin  10 mg Oral Daily  . sodium chloride flush  3 mL Intravenous Q12H   Continuous: . sodium chloride 250 mL (06/08/16 1901)  . azithromycin Stopped (06/12/16 1818)  . cefTRIAXone (ROCEPHIN)  IV Stopped (06/12/16 2006)   HWK:GSUPJS chloride, acetaminophen **OR** acetaminophen, LORazepam, ondansetron (ZOFRAN) IV, oxyCODONE, sodium chloride flush  Assesment: He was admitted with shortness of breath. He seems to have some element of COPD. He is on antibiotics for what appears to be a patch of pneumonia. He has a history of chronic atrial fib. He has acute on chronic diastolic heart failure. He has an esophageal stricture but was unable to undergo procedure yesterday because of his oxygenation. Principal Problem:   Dyspnea Active Problems:   Essential hypertension   Diabetes (HCC)   Acute on chronic diastolic CHF (congestive heart failure) (HCC)   CAD in native artery   Chest pain   Persistent atrial fibrillation (HCC)   CKD (chronic kidney disease), stage III   Esophageal stricture   Atrial fibrillation with normal ventricular rate (HCC)   Pressure injury of skin    Plan: Add Mucinex, flutter valve, Brio.  I will be out of town after today until the 18th    LOS: 5 days   Yer Olivencia L 06/13/2016, 9:46 AM

## 2016-06-13 NOTE — Progress Notes (Addendum)
Glucose over 600. Contacted MD. Did not administer insulin ordered. Transfer to ICU for cont insulin drip. Continue to monitor

## 2016-06-13 NOTE — Progress Notes (Signed)
Pt CBG 458. DR Legrand Rams E-Paged and received verbal order to give one time dose of 25 units and to discontinue current insulin moderate scale. Received verbal order for insulin resistant scale.

## 2016-06-13 NOTE — Progress Notes (Addendum)
Patient ID: [name], male   DOB: [birth year], 83 y.o.   MRN: [id number]  Assessment/Plan: ADMITTED WITH SOB DUE TO CHF EXACERBATION. ON ELIQUIS FOR AFIB. HAVING DYSPHAGIA. EGD CANCELLED YESTERDAY DUE TO RESPIRATORY DISTRESS. PT HAD ELIQUIS 1300 JUN 8/ ELIQUIS WAS HELD AND NOW RE-STARTED. TOLERATING DYSPHAGIA 2 DIET.  PLAN: 1. PT CANNOT HAVE EGD/DIL WHILE TAKING ELIQUIS. PRIOR TO AN EGD/DIL THE ELIQUIS NEEDS TO BE HELD FOR 2-3 DAYS BASED ON HIS CrCl. MAY HAVE EGD/DIL AS AN OUTPATIENT WHEN BENEFITS OF STOPPING ELIQUIS OUTWEIGH THE RISKS. DISCUSSED WITH PT AND FAMILY FRIEND. 2. SOFT MECHANICAL DIET 3.  Will SIGN OFF. PLEASE CALL WITH QUESTIONS.   Subjective: Since I last evaluated the patient HIS SOB HAS IMPROVED. HE IS TOLERATING A DYSPHAGIA 2 DIET BUT DOESN'T LIKE THE TASTE. ELIQUIS RESTARTED.  Objective: Vital signs in last 24 hours: Vitals:   06/12/16 2113 06/13/16 0527  BP: (!) 153/78 (!) 166/83  Pulse: (!) 104 93  Resp: 20 18  Temp: 98.4 F (36.9 C) 97.7 F (36.5 C)   General appearance: alert, cooperative and no distress Resp: diminished breath sounds bilaterally, FAIR AIR MOVEMENT BILATERALLY Cardio: irregularly irregular rhythm GI: soft, non-tender; bowel sounds normal; no masses,  no organomegaly  Lab Results:  Cr 1.65 K 4.9 GLU 379   Studies/Results: Nm Pulmonary Perfusion  Result Date: 06/12/2016 CLINICAL DATA:  Shortness of breath. EXAM: NUCLEAR MEDICINE VENTILATION AND PERFUSION SCAN TECHNIQUE: Perfusion images were obtained in multiple projections after intravenous injection of radiopharmaceutical. Patient refused ventilation study. RADIOPHARMACEUTICALS:  4.3 MCi Tc62m MAA IV COMPARISON:  CT 06/10/2016. FINDINGS: Patient refused ventilation portion of the study. No prominent perfusion defects noted. Mild basilar defects mass likely related to atelectasis noted on CT of 06/10/2016 . IMPRESSION: 1.  Patient refused ventilation portion of study. 2. No prominent  perfusion defects to suggest pulmonary embolic disease. Minimal defects in the lung bases most likely related to prominent atelectasis noted on prior CT of 06/10/2016 . Electronically Signed   By: Marcello Moores  Register   On: 06/12/2016 13:22    Medications: I have reviewed the patient's current medications.   LOS: 5 days   Barney Drain 06/15/2013, 2:23 PM

## 2016-06-13 NOTE — Progress Notes (Signed)
At 2345 pt recheck blood glucose was 466, previous blood glucose was 428. MD aware and pt was given scheduled lantus and novolog at 2207. MD made aware of recheck glucose and wants a recheck at 0100. Will continue to monitor.

## 2016-06-13 NOTE — Progress Notes (Signed)
Patient states his dyspnea is significantly improved perfusion part of VQ scan low probability for PE. Patient currently on oral steroids prednisone 40. We'll ambulate patient and her bed to chair 3 times a day for strengthening today with assistance THERAN VANDERGRIFT WER:[id number] DOB: [birth year] DOA: 06/08/2016 PCP: [name], MD   Physical Exam: Blood pressure (!) 166/83, pulse 93, temperature 97.7 F (36.5 C), temperature source Oral, resp. rate 18, height 5\' 10"  (1.778 m), weight 99.1 kg (218 lb 7.6 oz), SpO2 96 %. Lungs show diminished breath sounds in the bases prolonged respiratory phase no rales no wheezes audible heart irregular rhythm no S3 no heaves thrills rubs   Investigations:  Recent Results (from the past 240 hour(s))  Blood culture (routine x 2)     Status: None (Preliminary result)   Collection Time: 06/08/16 12:37 PM  Result Value Ref Range Status   Specimen Description LEFT ANTECUBITAL  Final   Special Requests   Final    BOTTLES DRAWN AEROBIC AND ANAEROBIC Blood Culture adequate volume   Culture NO GROWTH 4 DAYS  Final   Report Status PENDING  Incomplete  Blood culture (routine x 2)     Status: None (Preliminary result)   Collection Time: 06/08/16  1:04 PM  Result Value Ref Range Status   Specimen Description RIGHT ANTECUBITAL  Final   Special Requests   Final    BOTTLES DRAWN AEROBIC AND ANAEROBIC Blood Culture adequate volume   Culture NO GROWTH 4 DAYS  Final   Report Status PENDING  Incomplete     Basic Metabolic Panel:  Recent Labs  06/12/16 0509 06/13/16 0651  NA 137 134*  K 4.5 4.9  CL 96* 94*  CO2 30 29  GLUCOSE 176* 379*  BUN 32* 32*  CREATININE 2.03* 1.65*  CALCIUM 9.9 10.2   Liver Function Tests: No results for input(s): AST, ALT, ALKPHOS, BILITOT, PROT, ALBUMIN in the last 72 hours.   CBC: No results for input(s): WBC, NEUTROABS, HGB, HCT, MCV, PLT in the last 72 hours.  Nm Pulmonary Perfusion  Result Date: 06/12/2016 CLINICAL  DATA:  Shortness of breath. EXAM: NUCLEAR MEDICINE VENTILATION AND PERFUSION SCAN TECHNIQUE: Perfusion images were obtained in multiple projections after intravenous injection of radiopharmaceutical. Patient refused ventilation study. RADIOPHARMACEUTICALS:  4.3 MCi Tc71m MAA IV COMPARISON:  CT 06/10/2016. FINDINGS: Patient refused ventilation portion of the study. No prominent perfusion defects noted. Mild basilar defects mass likely related to atelectasis noted on CT of 06/10/2016 . IMPRESSION: 1.  Patient refused ventilation portion of study. 2. No prominent perfusion defects to suggest pulmonary embolic disease. Minimal defects in the lung bases most likely related to prominent atelectasis noted on prior CT of 06/10/2016 . Electronically Signed   By: [place]   On: 06/12/2016 13:22      Medications:  Impression:  Principal Problem:   Dyspnea Active Problems:   Essential hypertension   Diabetes (HCC)   Acute on chronic diastolic CHF (congestive heart failure) (HCC)   CAD in native artery   Chest pain   Persistent atrial fibrillation (HCC)   CKD (chronic kidney disease), stage III   Esophageal stricture   Atrial fibrillation with normal ventricular rate (HCC)   Pressure injury of skin     Plan: Hold diuretics renal function improving continue steroids out of bed to chair 3 times a day and ambulate with assistance  Consultants: Pulmonary cardiology gastroenterology    Procedures   Antibiotics: Rocephin and Zithromax and vancomycin  Time spent: 30 minutes   LOS: 5 days   Joyel Chenette M   06/13/2016, 11:21 AM

## 2016-06-13 NOTE — Progress Notes (Signed)
Report given to Tiffany Rn in ICU. Critical Glucose of 576 called at 2230. MD aware of glucose. Continue to monitor

## 2016-06-14 LAB — GLUCOSE, CAPILLARY
GLUCOSE-CAPILLARY: 121 mg/dL — AB (ref 65–99)
GLUCOSE-CAPILLARY: 153 mg/dL — AB (ref 65–99)
GLUCOSE-CAPILLARY: 247 mg/dL — AB (ref 65–99)
GLUCOSE-CAPILLARY: 289 mg/dL — AB (ref 65–99)
GLUCOSE-CAPILLARY: 364 mg/dL — AB (ref 65–99)
GLUCOSE-CAPILLARY: 370 mg/dL — AB (ref 65–99)
GLUCOSE-CAPILLARY: 419 mg/dL — AB (ref 65–99)
GLUCOSE-CAPILLARY: 430 mg/dL — AB (ref 65–99)
GLUCOSE-CAPILLARY: 439 mg/dL — AB (ref 65–99)
GLUCOSE-CAPILLARY: 84 mg/dL (ref 65–99)
Glucose-Capillary: 189 mg/dL — ABNORMAL HIGH (ref 65–99)
Glucose-Capillary: 178 mg/dL — ABNORMAL HIGH (ref 65–99)
Glucose-Capillary: 150 mg/dL — ABNORMAL HIGH (ref 65–99)
Glucose-Capillary: 119 mg/dL — ABNORMAL HIGH (ref 65–99)
Glucose-Capillary: 185 mg/dL — ABNORMAL HIGH (ref 65–99)
Glucose-Capillary: 322 mg/dL — ABNORMAL HIGH (ref 65–99)

## 2016-06-14 LAB — BASIC METABOLIC PANEL
ANION GAP: 10 (ref 5–15)
ANION GAP: 11 (ref 5–15)
Anion gap: 11 (ref 5–15)
BUN: 33 mg/dL — ABNORMAL HIGH (ref 6–20)
BUN: 33 mg/dL — AB (ref 6–20)
BUN: 32 mg/dL — ABNORMAL HIGH (ref 6–20)
CALCIUM: 9.8 mg/dL (ref 8.9–10.3)
CALCIUM: 10.1 mg/dL (ref 8.9–10.3)
CHLORIDE: 93 mmol/L — AB (ref 101–111)
CHLORIDE: 94 mmol/L — AB (ref 101–111)
CO2: 28 mmol/L (ref 22–32)
CO2: 30 mmol/L (ref 22–32)
CO2: 27 mmol/L (ref 22–32)
CREATININE: 1.6 mg/dL — AB (ref 0.61–1.24)
Calcium: 9.9 mg/dL (ref 8.9–10.3)
Chloride: 97 mmol/L — ABNORMAL LOW (ref 101–111)
Creatinine, Ser: 1.53 mg/dL — ABNORMAL HIGH (ref 0.61–1.24)
Creatinine, Ser: 1.49 mg/dL — ABNORMAL HIGH (ref 0.61–1.24)
GFR calc non Af Amer: 41 mL/min — ABNORMAL LOW (ref >60)
GFR, EST AFRICAN AMERICAN: 45 mL/min — AB (ref >60)
GFR, EST AFRICAN AMERICAN: 47 mL/min — AB (ref >60)
GFR, EST AFRICAN AMERICAN: 49 mL/min — AB (ref >60)
GFR, EST NON AFRICAN AMERICAN: 38 mL/min — AB (ref >60)
GFR, EST NON AFRICAN AMERICAN: 42 mL/min — AB (ref >60)
GLUCOSE: 95 mg/dL (ref 65–99)
Glucose, Bld: 297 mg/dL — ABNORMAL HIGH (ref 65–99)
Glucose, Bld: 185 mg/dL — ABNORMAL HIGH (ref 65–99)
POTASSIUM: 4.1 mmol/L (ref 3.5–5.1)
POTASSIUM: 4.3 mmol/L (ref 3.5–5.1)
Potassium: 4.5 mmol/L (ref 3.5–5.1)
SODIUM: 132 mmol/L — AB (ref 135–145)
SODIUM: 135 mmol/L (ref 135–145)
Sodium: 134 mmol/L — ABNORMAL LOW (ref 135–145)

## 2016-06-14 LAB — MRSA PCR SCREENING: MRSA BY PCR: INVALID — AB

## 2016-06-14 LAB — GLUCOSE, RANDOM: Glucose, Bld: 449 mg/dL — ABNORMAL HIGH (ref 65–99)

## 2016-06-14 MED ORDER — INSULIN GLARGINE 100 UNIT/ML ~~LOC~~ SOLN
40.0000 [IU] | Freq: Every day | SUBCUTANEOUS | Status: DC
  Filled 2016-06-14: qty 0.4

## 2016-06-14 MED ORDER — INSULIN ASPART 100 UNIT/ML ~~LOC~~ SOLN
5.0000 [IU] | SUBCUTANEOUS | Status: DC
  Administered 2016-06-14 – 2016-06-15 (×5): 5 [IU] via SUBCUTANEOUS

## 2016-06-14 MED ORDER — APIXABAN 2.5 MG PO TABS
2.5000 mg | ORAL_TABLET | Freq: Two times a day (BID) | ORAL | Status: DC
  Administered 2016-06-14 – 2016-06-15 (×2): 2.5 mg via ORAL
  Filled 2016-06-14 (×3): qty 1

## 2016-06-14 MED ORDER — INSULIN ASPART 100 UNIT/ML ~~LOC~~ SOLN
0.0000 [IU] | SUBCUTANEOUS | Status: DC
Start: 2016-06-14 — End: 2016-06-15
  Administered 2016-06-14: 20 [IU] via SUBCUTANEOUS
  Administered 2016-06-15: 4 [IU] via SUBCUTANEOUS
  Administered 2016-06-15: 20 [IU] via SUBCUTANEOUS
  Administered 2016-06-15: 7 [IU] via SUBCUTANEOUS
  Administered 2016-06-15: 11 [IU] via SUBCUTANEOUS

## 2016-06-14 MED ORDER — INSULIN GLARGINE 100 UNIT/ML ~~LOC~~ SOLN
15.0000 [IU] | Freq: Once | SUBCUTANEOUS | Status: AC
  Administered 2016-06-14: 15 [IU] via SUBCUTANEOUS
  Filled 2016-06-14: qty 0.15

## 2016-06-14 MED ORDER — PREDNISONE 20 MG PO TABS
20.0000 mg | ORAL_TABLET | Freq: Every day | ORAL | Status: DC
  Administered 2016-06-15: 20 mg via ORAL
  Filled 2016-06-14: qty 1

## 2016-06-14 MED ORDER — GUAIFENESIN-DM 100-10 MG/5ML PO SYRP
5.0000 mL | ORAL_SOLUTION | ORAL | Status: DC | PRN
  Administered 2016-06-14: 5 mL via ORAL
  Filled 2016-06-14: qty 5

## 2016-06-14 MED ORDER — INSULIN GLARGINE 100 UNIT/ML ~~LOC~~ SOLN
50.0000 [IU] | Freq: Every day | SUBCUTANEOUS | Status: DC
  Administered 2016-06-14: 50 [IU] via SUBCUTANEOUS
  Filled 2016-06-14 (×2): qty 0.5

## 2016-06-14 NOTE — Progress Notes (Signed)
Glucose is normalized after insulin infusion secondary to steroid utilization. We will decrease prednisone today respiratory status improved we'll discontinue L requests in anticipation of possible EGD/dilatation Tuesday? Lantus has been increased we'll monitor sliding scale insulin coverage before meals and at bedtime patient is hemodynamically stable. Creatinine 1.49 BUELL PARCEL HQI:[id number] DOB: 1931/01/12 DOA: 06/08/2016 PCP: [name], MD   Physical Exam: Blood pressure (!) 129/93, pulse 98, temperature 98.2 F (36.8 C), temperature source Oral, resp. rate 16, height 5\' 10"  (1.778 m), weight 98.9 kg (218 lb 0.6 oz), SpO2 95 %. Lungs diminished breath sounds in the bases prolonged respiratory phase no rales audible. Heart irregular rhythm no S3 no heaves thrills rubs abdomen soft nontender bowel sounds normoactive   Investigations:  Recent Results (from the past 240 hour(s))  Blood culture (routine x 2)     Status: None   Collection Time: 06/08/16 12:37 PM  Result Value Ref Range Status   Specimen Description LEFT ANTECUBITAL  Final   Special Requests   Final    BOTTLES DRAWN AEROBIC AND ANAEROBIC Blood Culture adequate volume   Culture NO GROWTH 5 DAYS  Final   Report Status 06/13/2016 FINAL  Final  Blood culture (routine x 2)     Status: None   Collection Time: 06/08/16  1:04 PM  Result Value Ref Range Status   Specimen Description RIGHT ANTECUBITAL  Final   Special Requests   Final    BOTTLES DRAWN AEROBIC AND ANAEROBIC Blood Culture adequate volume   Culture NO GROWTH 5 DAYS  Final   Report Status 06/13/2016 FINAL  Final  MRSA PCR Screening     Status: Abnormal   Collection Time: 06/13/16 10:49 PM  Result Value Ref Range Status   MRSA by PCR INVALID RESULTS, SPECIMEN SENT FOR CULTURE (A) NEGATIVE Final    Comment: RESULT CALLED TO, READ BACK BY AND VERIFIED WITH: Trinna Post AT 0915AM BY HFLYNT 06/14/16        The GeneXpert MRSA Assay (FDA approved for NASAL  specimens only), is one component of a comprehensive MRSA colonization surveillance program. It is not intended to diagnose MRSA infection nor to guide or monitor treatment for MRSA infections.      Basic Metabolic Panel:  Recent Labs  06/14/16 0451 06/14/16 0845  NA 134* 135  K 4.1 4.3  CL 94* 97*  CO2 30 27  GLUCOSE 185* 95  BUN 33* 32*  CREATININE 1.53* 1.49*  CALCIUM 9.9 10.1   Liver Function Tests: No results for input(s): AST, ALT, ALKPHOS, BILITOT, PROT, ALBUMIN in the last 72 hours.   CBC: No results for input(s): WBC, NEUTROABS, HGB, HCT, MCV, PLT in the last 72 hours.  Nm Pulmonary Perfusion  Result Date: 06/12/2016 CLINICAL DATA:  Shortness of breath. EXAM: NUCLEAR MEDICINE VENTILATION AND PERFUSION SCAN TECHNIQUE: Perfusion images were obtained in multiple projections after intravenous injection of radiopharmaceutical. Patient refused ventilation study. RADIOPHARMACEUTICALS:  4.3 MCi Tc38m MAA IV COMPARISON:  CT 06/10/2016. FINDINGS: Patient refused ventilation portion of the study. No prominent perfusion defects noted. Mild basilar defects mass likely related to atelectasis noted on CT of 06/10/2016 . IMPRESSION: 1.  Patient refused ventilation portion of study. 2. No prominent perfusion defects to suggest pulmonary embolic disease. Minimal defects in the lung bases most likely related to prominent atelectasis noted on prior CT of 06/10/2016 . Electronically Signed   By: Marcello Moores  Register   On: 06/12/2016 13:22      Medications:   Impression:  Principal Problem:   Dyspnea Active Problems:   Essential hypertension   Diabetes (HCC)   Acute on chronic diastolic CHF (congestive heart failure) (HCC)   CAD in native artery   Chest pain   Persistent atrial fibrillation (HCC)   CKD (chronic kidney disease), stage III   Esophageal stricture   Atrial fibrillation with normal ventricular rate (HCC)   Pressure injury of skin     Plan: Decrease oral steroids  today transferred back to regular floor  Consultants: Gastroenterology cardiology pulmonology   Procedures   Antibiotics: Vancomycin and Rocephin and Zithromax           Time spent: 30 minutes   LOS: 6 days   Derk Doubek M   06/14/2016, 11:45 AM

## 2016-06-14 NOTE — Progress Notes (Signed)
Paged oncall MD. Pt's blood sugar is 430. Put in STAT lab for blood glucose verification. Dr. Legrand Rams phoned and aware of patient SBAR. States to give patient a total of 20 units sliding scale plus his additional 5 units. Will continue to monitor patient.

## 2016-06-14 NOTE — Progress Notes (Signed)
Pt doing well on insulin gtt. cbg is trending down. Last cbg was 280. Will change fluids over once cbg is <250. Will continue to monitor.  Ericka Pontiff, RN 3:24 AM 06/14/16

## 2016-06-15 ENCOUNTER — Telehealth: Payer: Self-pay | Admitting: Gastroenterology

## 2016-06-15 ENCOUNTER — Encounter: Payer: Self-pay | Admitting: Internal Medicine

## 2016-06-15 DIAGNOSIS — I25708 Atherosclerosis of coronary artery bypass graft(s), unspecified, with other forms of angina pectoris: Secondary | ICD-10-CM

## 2016-06-15 LAB — CREATININE, SERUM
CREATININE: 1.47 mg/dL — AB (ref 0.61–1.24)
GFR calc non Af Amer: 43 mL/min — ABNORMAL LOW (ref >60)
GFR, EST AFRICAN AMERICAN: 49 mL/min — AB (ref >60)

## 2016-06-15 LAB — CBC
HCT: 36.4 % — ABNORMAL LOW (ref 39.0–52.0)
Hemoglobin: 11.8 g/dL — ABNORMAL LOW (ref 13.0–17.0)
MCH: 27.8 pg (ref 26.0–34.0)
MCHC: 32.4 g/dL (ref 30.0–36.0)
MCV: 85.6 fL (ref 78.0–100.0)
PLATELETS: 292 10*3/uL (ref 150–400)
RBC: 4.25 MIL/uL (ref 4.22–5.81)
RDW: 13.9 % (ref 11.5–15.5)
WBC: 19.6 10*3/uL — ABNORMAL HIGH (ref 4.0–10.5)

## 2016-06-15 LAB — MRSA CULTURE: Culture: NOT DETECTED

## 2016-06-15 LAB — GLUCOSE, CAPILLARY
GLUCOSE-CAPILLARY: 189 mg/dL — AB (ref 65–99)
Glucose-Capillary: 270 mg/dL — ABNORMAL HIGH (ref 65–99)
Glucose-Capillary: 208 mg/dL — ABNORMAL HIGH (ref 65–99)

## 2016-06-15 MED ORDER — INSULIN GLARGINE 100 UNIT/ML ~~LOC~~ SOLN: 50.0000 [IU] | Freq: Every day | SUBCUTANEOUS | 11 refills | Status: DC

## 2016-06-15 MED ORDER — IPRATROPIUM-ALBUTEROL 0.5-2.5 (3) MG/3ML IN SOLN
3.0000 mL | Freq: Three times a day (TID) | RESPIRATORY_TRACT | Status: DC
  Administered 2016-06-15: 3 mL via RESPIRATORY_TRACT
  Filled 2016-06-15: qty 3

## 2016-06-15 MED ORDER — METOPROLOL SUCCINATE ER 25 MG PO TB24: 75.0000 mg | ORAL_TABLET | Freq: Every day | ORAL | 4 refills | Status: DC

## 2016-06-15 NOTE — Discharge Summary (Signed)
Physician Discharge Summary  [name] IZT:[id number] DOB: 10-Feb-1931 DOA: 06/08/2016  PCP: [name], MD  Admit date: 06/08/2016 Discharge date: 06/15/2016   Recommendations for Outpatient Follow-up:  Patient is advised to increase his dosage of liquids to 5 mg by mouth twice a day likewise increase his Toprol-XL 25 mg tablets to 3 tablets together once a day that is 75 mg is likewise advised to increase his Lantus to 50 units at bedtime every night and to follow-up in my office within one week's time to assess renal function electrolytes as well as glycemic control and heart rate control Discharge Diagnoses:  Principal Problem:   Dyspnea Active Problems:   Essential hypertension   Diabetes ([place])   Acute on chronic diastolic CHF (congestive heart failure) (HCC)   CAD in native artery   Chest pain   Persistent atrial fibrillation (HCC)   CKD (chronic kidney disease), stage III   Esophageal stricture   Atrial fibrillation with normal ventricular rate (HCC)   Pressure injury of skin   Discharge Condition: Good  Filed Weights   06/14/16 0500 06/14/16 1350 06/15/16 0553  Weight: 98.9 kg (218 lb 0.6 oz) 98.7 kg (217 lb 11.2 oz) 100.7 kg (222 lb)    History of present illness:  Patient is an 83 year old white male with multisystemic issues including coronary artery disease status post CABG 4 persistent atrial fibrillation anticoagulation insulin-dependent diabetes hypertension hyperlipidemia who was admitted with increasing dyspnea he was found to be back in atrial fibrillation since consultation by cardiology gastroenterology and pulmonology gastroenterology was summoned due to chronic disease 5 CL which seemed to disappear after 6 days in hospital EGD and possible dilatation of esophagus was planned however banding as the patient had too much dyspnea 2 have EGD as well as an anticoagulation was not terminated for 3 days time. Chest x-ray on admission shows right lower lobe  atelectasis question of infiltrate he was treated with vancomycin and Rocephin and Zithromax since day of admission given DuoNeb nebulizers pulmonary seen with the addition of oral steroids which seemed to improve his respiratory status problem was his glycemic control was worsened with the steroids any and output DKA in ICU this was resolved within 24 hours anion gap had closed and his daily insulin was increased to Lantus 50 units  at bedtime. Felt that he could undergo EGD as an outpatient patient had achieved heart rate control his echocardiogram showed a systolic ejection fraction of 60-65% and his dyspnea had resolved patient will follow-up in the office in 5-6 days time thank you end of dictation Dr. Alease Frame ago  Hospital Course:  See history of present illness above  Procedures:    Consultations:  Cardiology gastroenterology and pulmonology  Discharge Instructions  Discharge Instructions    Discharge instructions    Complete by:  As directed    Discharge patient    Complete by:  As directed    Discharge disposition:  01-Home or Self Care   Discharge patient date:  06/15/2016      No Known Allergies Follow-up Information    Lendon Colonel, NP On 06/29/2016.   Specialties:  Nurse Practitioner, Radiology, Cardiology Why:  at 1:00 pm Contact information: [place] [id number] [phone]            The results of significant diagnostics from this hospitalization (including imaging, microbiology, ancillary and laboratory) are listed below for reference.    Significant Diagnostic Studies: Ct Abdomen Wo Contrast  Result Date: 06/10/2016 CLINICAL DATA:  Productive cough, renal insufficiency, dysphagia. EXAM: CT CHEST AND ABDOMEN WITHOUT CONTRAST TECHNIQUE: Multidetector CT imaging of the chest and abdomen was performed following the standard protocol without intravenous contrast. COMPARISON:  CT scan of January 11, 2015. FINDINGS: CT CHEST FINDINGS  WITHOUT CONTRAST Cardiovascular: Atherosclerosis of thoracic aorta is noted without aneurysm formation. Status post coronary artery bypass graft. No pericardial effusion is noted. Mediastinum/Nodes: No enlarged mediastinal or axillary lymph nodes. Thyroid gland, trachea, and esophagus demonstrate no significant findings. Lungs/Pleura: No pneumothorax is noted. Minimal right basilar subsegmental atelectasis is noted. Mild left pleural effusion is noted with adjacent pneumonia or subsegmental atelectasis. Musculoskeletal: No chest wall mass or suspicious bone lesions identified. CT ABDOMEN FINDINGS WITHOUT CONTRAST Hepatobiliary: Possible minimal cholelithiasis is noted without inflammation. No abnormality seen in the liver on these unenhanced images. Pancreas: Unremarkable. No pancreatic ductal dilatation or surrounding inflammatory changes. Spleen: Normal in size without focal abnormality. Adrenals/Urinary Tract: Adrenal glands and kidneys appear normal. No hydronephrosis or renal obstruction is noted. No renal calculi are noted. Stomach/Bowel: No evidence of bowel dilatation or obstruction is noted. 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 Vascular/Lymphatic: Aortic atherosclerosis. No enlarged abdominal or pelvic lymph nodes. Other: No hernia or abnormal fluid collection is noted. Musculoskeletal: No acute or significant osseous findings. IMPRESSION: Aortic atherosclerosis. Mild left posterior basilar opacity is noted concerning for atelectasis or pneumonia with small associated pleural effusion. Possible minimal cholelithiasis. 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 Electronically Signed   By: Marijo Conception, M.D.   On: 06/10/2016 17:53   Dg Chest 2 View  Result Date: 06/08/2016 CLINICAL DATA:  chest pain x 3 weeks, sob, and chronic cough. Copd/diabetic/hx bypass surgery/ex  smoker EXAM: CHEST  2 VIEW COMPARISON:  03/19/2016 FINDINGS: Midline trachea. Mild cardiomegaly with transverse aortic atherosclerosis. Prior median sternotomy. Small left pleural effusion is not significantly changed. No pneumothorax. Mild pulmonary venous congestion. Similar patchy left base airspace disease, new since 01/10/2015. Probable bilateral nipple shadows. IMPRESSION: Since 03/19/2016, similar small left pleural effusion with adjacent airspace disease. This could represent chronic atelectasis. Pneumonia cannot be excluded. Cardiomegaly with mild pulmonary venous congestion. Probable bilateral nipple shadows. Repeat frontal radiograph with nipple markers could confirm. Electronically Signed   By: Abigail Miyamoto M.D.   On: 06/08/2016 10:25   Ct Chest Wo Contrast  Result Date: 06/10/2016 CLINICAL DATA:  Productive cough, renal insufficiency, dysphagia. EXAM: CT CHEST AND ABDOMEN WITHOUT CONTRAST TECHNIQUE: Multidetector CT imaging of the chest and abdomen was performed following the standard protocol without intravenous contrast. COMPARISON:  CT scan of January 11, 2015. FINDINGS: CT CHEST FINDINGS WITHOUT CONTRAST Cardiovascular: Atherosclerosis of thoracic aorta is noted without aneurysm formation. Status post coronary artery bypass graft. No pericardial effusion is noted. Mediastinum/Nodes: No enlarged mediastinal or axillary lymph nodes. Thyroid gland, trachea, and esophagus demonstrate no significant findings. Lungs/Pleura: No pneumothorax is noted. Minimal right basilar subsegmental atelectasis is noted. Mild left pleural effusion is noted with adjacent pneumonia or subsegmental atelectasis. Musculoskeletal: No chest wall mass or suspicious bone lesions identified. CT ABDOMEN FINDINGS WITHOUT CONTRAST Hepatobiliary: Possible minimal cholelithiasis is noted without inflammation. No abnormality seen in the liver on these unenhanced images. Pancreas: Unremarkable. No pancreatic ductal dilatation or  surrounding inflammatory changes. Spleen: Normal in size without focal abnormality. Adrenals/Urinary Tract: Adrenal glands and kidneys appear normal. No  hydronephrosis or renal obstruction is noted. No renal calculi are noted. Stomach/Bowel: No evidence of bowel dilatation or obstruction is noted. 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 Vascular/Lymphatic: Aortic atherosclerosis. No enlarged abdominal or pelvic lymph nodes. Other: No hernia or abnormal fluid collection is noted. Musculoskeletal: No acute or significant osseous findings. IMPRESSION: Aortic atherosclerosis. Mild left posterior basilar opacity is noted concerning for atelectasis or pneumonia with small associated pleural effusion. Possible minimal cholelithiasis. 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 Electronically Signed   By: Marijo Conception, M.D.   On: 06/10/2016 17:53   Mr Lumbar Spine Wo Contrast  Result Date: 06/05/2016 CLINICAL DATA:  Severe chronic right-sided low back pain. EXAM: MRI LUMBAR SPINE WITHOUT CONTRAST TECHNIQUE: Multiplanar, multisequence MR imaging of the lumbar spine was performed. No intravenous contrast was administered. COMPARISON:  MRI DATED 01/23/2011 FINDINGS: Segmentation:  Standard. Alignment:  Physiologic. Vertebrae:  No fracture, evidence of discitis, or bone lesion. Conus medullaris: Extends to the L1-2 level and appears normal. Paraspinal and other soft tissues: Negative. Disc levels: L1-2 AND L2-3:  Slight disc desiccation.  Otherwise normal. L3-4: Tiny broad-based disc bulge with a small protrusion into the right neural foramen without neural impingement. This is slightly more prominent than the disc bulge present on the prior study. L4-5: Soft disc protrusion into the right neural foramen adjacent 2 but not compressing the right L4 nerve. This could irritate the nerve,  however. The finding is more prominent than on the prior study. Small disc bulge into the left neural foramen. Slight hypertrophy of the ligamentum flavum without neural impingement. L5-S1: Small disc protrusion into the right neural foramen adjacent to the right L5 nerve. Hypertrophy of the right ligamentum flavum creates moderate right foraminal stenosis. This could irritate the right L5 nerve. This is more prominent on the prior study. Small disc bulge into the left neural foramen with moderate left foraminal stenosis which could affect the left L5 nerve. Slight hypertrophy of the left facet joint. IMPRESSION: Progressive degenerative disc disease at L3-4 through L5-S1 with new small disc protrusions into the right neural foramina at L3-4, L4-5, and L5-S1. These could possibly irritate the exiting nerve roots. Electronically Signed   By: Lorriane Shire M.D.   On: 06/05/2016 11:19   Nm Pulmonary Perfusion  Result Date: 06/12/2016 CLINICAL DATA:  Shortness of breath. EXAM: NUCLEAR MEDICINE VENTILATION AND PERFUSION SCAN TECHNIQUE: Perfusion images were obtained in multiple projections after intravenous injection of radiopharmaceutical. Patient refused ventilation study. RADIOPHARMACEUTICALS:  4.3 MCi Tc37m MAA IV COMPARISON:  CT 06/10/2016. FINDINGS: Patient refused ventilation portion of the study. No prominent perfusion defects noted. Mild basilar defects mass likely related to atelectasis noted on CT of 06/10/2016 . IMPRESSION: 1.  Patient refused ventilation portion of study. 2. No prominent perfusion defects to suggest pulmonary embolic disease. Minimal defects in the lung bases most likely related to prominent atelectasis noted on prior CT of 06/10/2016 . Electronically Signed   By: Marcello Moores  Register   On: 06/12/2016 13:22    Microbiology: Recent Results (from the past 240 hour(s))  Blood culture (routine x 2)     Status: None   Collection Time: 06/08/16 12:37 PM  Result Value Ref Range Status    Specimen Description LEFT ANTECUBITAL  Final   Special Requests   Final    BOTTLES DRAWN AEROBIC AND ANAEROBIC Blood  Culture adequate volume   Culture NO GROWTH 5 DAYS  Final   Report Status 06/13/2016 FINAL  Final  Blood culture (routine x 2)     Status: None   Collection Time: 06/08/16  1:04 PM  Result Value Ref Range Status   Specimen Description RIGHT ANTECUBITAL  Final   Special Requests   Final    BOTTLES DRAWN AEROBIC AND ANAEROBIC Blood Culture adequate volume   Culture NO GROWTH 5 DAYS  Final   Report Status 06/13/2016 FINAL  Final  MRSA PCR Screening     Status: Abnormal   Collection Time: 06/13/16 10:49 PM  Result Value Ref Range Status   MRSA by PCR INVALID RESULTS, SPECIMEN SENT FOR CULTURE (A) NEGATIVE Final    Comment: RESULT CALLED TO, READ BACK BY AND VERIFIED WITH: Trinna Post AT 0915AM BY HFLYNT 06/14/16        The GeneXpert MRSA Assay (FDA approved for NASAL specimens only), is one component of a comprehensive MRSA colonization surveillance program. It is not intended to diagnose MRSA infection nor to guide or monitor treatment for MRSA infections.   MRSA culture     Status: None   Collection Time: 06/14/16  7:30 AM  Result Value Ref Range Status   Specimen Description NASAL SWAB  Final   Special Requests NONE  Final   Culture   Final    NO MRSA DETECTED Performed at [facility], [address], [place], [place] [address]    Report Status 06/15/2016 FINAL  Final     Labs: Basic Metabolic Panel:  Recent Labs Lab 06/13/16 0651 06/13/16 2155 06/14/16 0307 06/14/16 0451 06/14/16 0845 06/14/16 2004 06/15/16 0550  NA 134* 133* 132* 134* 135  --   --   K 4.9 5.4* 4.5 4.1 4.3  --   --   CL 94* 92* 93* 94* 97*  --   --   CO2 29 25 28 30 27   --   --   GLUCOSE 379* 576* 297* 185* 95 449*  --   BUN 32* 35* 33* 33* 32*  --   --   CREATININE 1.65* 1.92* 1.60* 1.53* 1.49*  --  1.47*  CALCIUM 10.2 10.4* 9.8 9.9 10.1  --   --    Liver Function  Tests: No results for input(s): AST, ALT, ALKPHOS, BILITOT, PROT, ALBUMIN in the last 168 hours. No results for input(s): LIPASE, AMYLASE in the last 168 hours. No results for input(s): AMMONIA in the last 168 hours. CBC:  Recent Labs Lab 06/09/16 0330 06/15/16 0550  WBC 10.8* 19.6*  HGB 10.9* 11.8*  HCT 34.3* 36.4*  MCV 88.6 85.6  PLT 305 292   Cardiac Enzymes:  Recent Labs Lab 06/08/16 1704 06/08/16 2106 06/09/16 0330  TROPONINI <0.03 <0.03 <0.03   BNP: BNP (last 3 results)  Recent Labs  03/19/16 1222 06/08/16 1133  BNP 156.0* 1,759.0*    ProBNP (last 3 results) No results for input(s): PROBNP in the last 8760 hours.  CBG:  Recent Labs Lab 06/14/16 2138 06/15/16 0001 06/15/16 0359 06/15/16 0803 06/15/16 1125  GLUCAP 419* 364* 270* 189* 208*       Signed:  Arian Mcquitty M  [facility] Pager: [phone] 06/15/2016, 12:58 PM

## 2016-06-15 NOTE — Progress Notes (Signed)
[name] discharged Home per MD order.  Discharge instructions reviewed and discussed with the patient, all questions and concerns answered. Copy of instructions and scripts given to patient.  Allergies as of 06/15/2016   No Known Allergies     Medication List    STOP taking these medications   insulin lispro 100 UNIT/ML injection Commonly known as:  HUMALOG   LANTUS SOLOSTAR 100 UNIT/ML Solostar Pen Generic drug:  Insulin Glargine Replaced by:  insulin glargine 100 UNIT/ML injection   levofloxacin 500 MG tablet Commonly known as:  LEVAQUIN     TAKE these medications   AZOR 5-40 MG tablet Generic drug:  amLODipine-olmesartan Take 1 tablet by mouth daily.   cholecalciferol 1000 units tablet Commonly known as:  VITAMIN D Take 5,000 Units by mouth daily.   cloNIDine 0.1 MG tablet Commonly known as:  CATAPRES Take 0.1 mg by mouth 2 (two) times daily.   docusate sodium 100 MG capsule Commonly known as:  COLACE Take 100 mg by mouth 2 (two) times daily.   ELIQUIS 5 MG Tabs tablet Generic drug:  apixaban Take 5 mg by mouth 2 (two) times daily.   furosemide 20 MG tablet Commonly known as:  LASIX Take 20 mg by mouth 2 (two) times daily.   hydrocortisone cream 1 % Apply 1 application topically 2 (two) times daily.   insulin glargine 100 UNIT/ML injection Commonly known as:  LANTUS Inject 0.5 mLs (50 Units total) into the skin at bedtime. Replaces:  LANTUS SOLOSTAR 100 UNIT/ML Solostar Pen   ipratropium-albuterol 0.5-2.5 (3) MG/3ML Soln Commonly known as:  DUONEB Take 3 mLs by nebulization every 6 (six) hours as needed (wheezing).   meclizine 12.5 MG tablet Commonly known as:  ANTIVERT Take 12.5 mg by mouth 2 (two) times daily.   metoprolol succinate 25 MG 24 hr tablet Commonly known as:  TOPROL-XL Take 3 tablets (75 mg total) by mouth daily. Take with or immediately following a meal. Start taking on:  06/16/2016 What changed:  medication strength  how  much to take   nitroGLYCERIN 0.4 MG SL tablet Commonly known as:  NITROSTAT Place 1 tablet (0.4 mg total) under the tongue every 5 (five) minutes as needed for chest pain.   oxyCODONE 15 MG immediate release tablet Commonly known as:  ROXICODONE Take 15 mg by mouth 4 (four) times daily as needed for pain.   pantoprazole 40 MG tablet Commonly known as:  PROTONIX Take 40 mg by mouth daily.   potassium chloride 8 MEQ tablet Commonly known as:  KLOR-CON Take 1 tablet (8 mEq total) by mouth daily.   prednisoLONE acetate 1 % ophthalmic suspension Commonly known as:  PRED FORTE Place 1 drop into the left eye 4 (four) times daily.   pregabalin 50 MG capsule Commonly known as:  LYRICA Take 50 mg by mouth daily.   rosuvastatin 10 MG tablet Commonly known as:  CRESTOR Take 10 mg by mouth daily.   silodosin 8 MG Caps capsule Commonly known as:  RAPAFLO Take 8 mg by mouth daily with breakfast.   timolol 0.5 % ophthalmic solution Commonly known as:  BETIMOL Place 1 drop into the left eye daily.        IV site discontinued and catheter remains intact. Site without signs and symptoms of complications. Dressing and pressure applied.  Patient escorted to car by NT in a wheelchair,  no distress noted upon discharge.  [name] [name] 06/15/2016 3:05 PM

## 2016-06-15 NOTE — Care Management Important Message (Signed)
Important Message  Patient Details  Name: MAKARIOS MADLOCK MRN: [id number] Date of Birth: [birth year]   Medicare Important Message Given:  Yes    Sherald Barge, RN 06/15/2016, 1:11 PM

## 2016-06-15 NOTE — Progress Notes (Addendum)
Progress Note  Patient Name: [name] Date of Encounter: 06/15/2016  Primary Cardiologist: Domenic Polite  Subjective   Feeling much better. Says shortness of breath has resolved. Denies chest pain.  Inpatient Medications    Scheduled Meds: . apixaban  2.5 mg Oral BID  . feeding supplement (GLUCERNA SHAKE)  237 mL Oral TID BM  . fluticasone furoate-vilanterol  1 puff Inhalation Daily  . guaiFENesin  1,200 mg Oral BID  . insulin aspart  0-20 Units Subcutaneous Q4H  . insulin aspart  5 Units Subcutaneous Q4H  . insulin glargine  50 Units Subcutaneous QHS  . ipratropium-albuterol  3 mL Nebulization TID  . mouth rinse  15 mL Mouth Rinse BID  . metoprolol succinate  75 mg Oral Daily  . pantoprazole  40 mg Oral BID AC  . prednisoLONE acetate  1 drop Left Eye QID  . predniSONE  20 mg Oral Q breakfast  . rosuvastatin  10 mg Oral Daily  . sodium chloride flush  3 mL Intravenous Q12H   Continuous Infusions: . sodium chloride 250 mL (06/08/16 1901)  . sodium chloride 100 mL/hr at 06/15/16 0621   PRN Meds: sodium chloride, acetaminophen **OR** acetaminophen, dextrose, guaiFENesin-dextromethorphan, LORazepam, ondansetron (ZOFRAN) IV, oxyCODONE, sodium chloride flush   Vital Signs    Vitals:   06/14/16 1952 06/15/16 0553 06/15/16 0720 06/15/16 0729  BP: 131/71 (!) 160/60    Pulse: 81 69    Resp: 20 15    Temp: 98.4 F (36.9 C) 98.2 F (36.8 C)    TempSrc: Oral     SpO2: 94% 97% 97% 97%  Weight:  222 lb (100.7 kg)    Height:        Intake/Output Summary (Last 24 hours) at 06/15/16 1131 Last data filed at 06/15/16 0900  Gross per 24 hour  Intake           561.67 ml  Output              400 ml  Net           161.67 ml   Filed Weights   06/14/16 0500 06/14/16 1350 06/15/16 0553  Weight: 218 lb 0.6 oz (98.9 kg) 217 lb 11.2 oz (98.7 kg) 222 lb (100.7 kg)    Telemetry    - Personally Reviewed  ECG     Physical Exam   GEN: No acute distress.   Neck: No  JVD Cardiac: Irregular rhythm, Normal S1/S2, no S3, no murmurs, rubs, or gallops.  Respiratory: Clear to auscultation bilaterally. GI: Soft, nontender, non-distended  MS: No edema; No deformity. Neuro:  Nonfocal  Psych: Normal affect   Labs    Chemistry Recent Labs Lab 06/08/16 1133  06/14/16 0307 06/14/16 0451 06/14/16 0845 06/14/16 2004 06/15/16 0550  NA  --   < > 132* 134* 135  --   --   K  --   < > 4.5 4.1 4.3  --   --   CL  --   < > 93* 94* 97*  --   --   CO2  --   < > 28 30 27   --   --   GLUCOSE  --   < > 297* 185* 95 449*  --   BUN  --   < > 33* 33* 32*  --   --   CREATININE  --   < > 1.60* 1.53* 1.49*  --  1.47*  CALCIUM  --   < >  9.8 9.9 10.1  --   --   PROT 7.1  --   --   --   --   --   --   ALBUMIN 3.1*  --   --   --   --   --   --   AST 15  --   --   --   --   --   --   ALT 19  --   --   --   --   --   --   ALKPHOS 171*  --   --   --   --   --   --   BILITOT 0.7  --   --   --   --   --   --   GFRNONAA  --   < > 38* 41* 42*  --  43*  GFRAA  --   < > 45* 47* 49*  --  49*  ANIONGAP  --   < > 11 10 11   --   --   < > = values in this interval not displayed.   Hematology Recent Labs Lab 06/09/16 0330 06/15/16 0550  WBC 10.8* 19.6*  RBC 3.87* 4.25  HGB 10.9* 11.8*  HCT 34.3* 36.4*  MCV 88.6 85.6  MCH 28.2 27.8  MCHC 31.8 32.4  RDW 14.3 13.9  PLT 305 292    Cardiac Enzymes Recent Labs Lab 06/08/16 1704 06/08/16 2106 06/09/16 0330  TROPONINI <0.03 <0.03 <0.03   No results for input(s): TROPIPOC in the last 168 hours.   BNP Recent Labs Lab 06/08/16 1133  BNP 1,759.0*     DDimer No results for input(s): DDIMER in the last 168 hours.   Radiology    No results found.  Cardiac Studies   Echocardiogram (06/09/16):  - Left ventricle: The cavity size was normal. Wall thickness was   increased increased in a pattern of mild to moderate LVH.   Systolic function was normal. The estimated ejection fraction was   in the range of 60% to 65%. Wall  motion was normal; there were no   regional wall motion abnormalities. The study is not technically   sufficient to allow evaluation of LV diastolic function. - Aortic valve: Mildly calcified annulus. Trileaflet; mildly   thickened, mildly calcified leaflets. - Mitral valve: Calcified annulus. There was mild regurgitation. - Left atrium: The atrium was mildly dilated. - Right atrium: Central venous pressure (est): 8 mm Hg. - Tricuspid valve: There was trivial regurgitation. - Pulmonary arteries: PA peak pressure: 42 mm Hg (S). - Pericardium, extracardiac: A prominent pericardial fat pad was   present.  Impressions:  - Mild to moderate LVH with LVEF 60-65%. Indeterminate diastolic   function. Mild left atrial enlargement. Mildly calcified mitral   annulus with mild mitral regurgitation. Sclerotic aortic valve   without stenosis. Trivial tricuspid regurgitation with PASP   estimated 42 mmHg. Prominent pericardial fat pad noted.  Patient Profile     83 y.o. male with history of multivessel CAD status post previous PCI and ultimately CABG in 1245, chronic diastolic heart failure, paroxysmal to persistent atrial fibrillation, CAD stage III, hypertension, and previous DVT with postphlebitic syndrome. He presents to the hospital with shortness of breath and cough, progressive over the last few months. Does not report any obvious angina. Has been back in atrial fibrillation following cardioversion earlier in the year, duration is uncertain. Cardiac enzymes argue against ACS. Lower chest CT does not show evidence of  progressive pneumonia or mass.  Assessment & Plan    1. Persistent atrial fibrillation: Toprol-XL increased to 75 mg for HR control. Currently asymptomatic. On low dose Eliquis 2.5 mg bid. LVEF is normal.  2. Chronic diastolic heart failure: Euvolemic. Creatinine decreasing. Consider restarting Lasix low dose at time of discharge.  3. CAD with h/o CABG: Stable. Continue beta  blocker and statin.  4. CKD stage III: Creatinine trending down. Lasix held. Consider restarting Lasix low dose at time of discharge.  5. Shortness of breath/COPD: Symptoms have resolved.  6. Esophageal stricture: EGD to be performed in outpatient setting as per GI.  Dispo: No further recommendations. Will sign off. Will arrange outpatient follow up.   Signed, Kate Sable, MD  06/15/2016, 11:31 AM

## 2016-06-15 NOTE — Care Management Note (Signed)
Case Management Note  Patient Details  Name: [name] MRN: [id number] Date of Birth: 1932-01-02   Expected Discharge Date:  06/15/16               Expected Discharge Plan:  [facility]  In-House Referral:  NA  Discharge planning Services  CM Consult  Post Acute Care Choice:  Home Health Choice offered to:  Patient  DME Arranged:    DME Agency:     HH Arranged:  RN, PT [place] Agency:  [place]  Status of Service:  Completed, signed off  Additional Comments: Discharging home today with [facility] nursing and PT. Pt aware HH has 48 hours to make first visit. [facility] rep, linda, aware of discharge. Pt has successfully weaned from supplemental oxygen. Pt communicates no needs.   Sherald Barge, RN 06/15/2016, 1:11 PM

## 2016-06-15 NOTE — Telephone Encounter (Signed)
APPT MADE AND LETTER SENT TO PATIENT  °

## 2016-06-15 NOTE — Telephone Encounter (Signed)
Please have patient see [place] in hospital follow-up due to dysphagia in 4-6 weeks to discuss EGD/ED.

## 2016-06-29 ENCOUNTER — Encounter: Payer: Self-pay | Admitting: Adult Health

## 2016-06-29 ENCOUNTER — Ambulatory Visit (INDEPENDENT_AMBULATORY_CARE_PROVIDER_SITE_OTHER): Payer: Medicare Other | Admitting: Adult Health

## 2016-06-29 VITALS — BP 118/60 | HR 115 | Ht 70.0 in | Wt 215.0 lb

## 2016-06-29 DIAGNOSIS — I481 Persistent atrial fibrillation: Secondary | ICD-10-CM | POA: Diagnosis not present

## 2016-06-29 DIAGNOSIS — I10 Essential (primary) hypertension: Secondary | ICD-10-CM | POA: Diagnosis not present

## 2016-06-29 DIAGNOSIS — I5032 Chronic diastolic (congestive) heart failure: Secondary | ICD-10-CM

## 2016-06-29 DIAGNOSIS — I2510 Atherosclerotic heart disease of native coronary artery without angina pectoris: Secondary | ICD-10-CM | POA: Diagnosis not present

## 2016-06-29 DIAGNOSIS — R000 Tachycardia, unspecified: Secondary | ICD-10-CM

## 2016-06-29 DIAGNOSIS — I4819 Other persistent atrial fibrillation: Secondary | ICD-10-CM

## 2016-06-29 NOTE — Progress Notes (Signed)
Cardiology Office Note   Date:  06/29/2016   ID:  [name] December 26, 1931, MRN [id number]  PCP:  [name], MD  Cardiologist:  Domenic Polite  Chief Complaint  Patient presents with  . Coronary Artery Disease  . Hypertension  . Atrial Fibrillation  . Hospitalization Follow-up      History of Present Illness: [name] is a 83 y.o. male who presents for ongoing assessment and management of coronary artery disease, status post CABG 2011, hypertension, atrial fibrillation, history of DVT, CHADS VASC Score of 5 , hyperlipidemia, other history to include diabetes, gastric mass or EGD in 2015. Was last seen by Dr. Domenic Polite on 03/20/2016. He was scheduled for DCCV which was completed by Dr. Harl Bowie on 03/20/2016, and converted to normal sinus rhythm at that time.  Unfortunately, the patient was admitted to [facility] on 06/08/2016 in the setting of recurrent chest pain and dyspnea. The patient was treated for pneumonia, felt his chest discomfort was atypical for coronary artery disease, was found to have persistent atrial fibrillation despite cardioversion, and was also treated for acute on chronic diastolic CHF.  Echocardiogram was completed: Left ventricle: The cavity size was normal. Wall thickness was   increased increased in a pattern of mild to moderate LVH.   Systolic function was normal. The estimated ejection fraction was   in the range of 60% to 65%. Wall motion was normal; there were no   regional wall motion abnormalities. The study is not technically   sufficient to allow evaluation of LV diastolic function. - Aortic valve: Mildly calcified annulus. Trileaflet; mildly   thickened, mildly calcified leaflets. - Mitral valve: Calcified annulus. There was mild regurgitation. - Left atrium: The atrium was mildly dilated. - Right atrium: Central venous pressure (est): 8 mm Hg. - Tricuspid valve: There was trivial regurgitation. - Pulmonary arteries: PA peak  pressure: 42 mm Hg (S). - Pericardium, extracardiac: A prominent pericardial fat pad was   present.  Due to rapid atrial fibrillation, metoprolol was increased to 75 mg daily, he remained on ELIQUIS 2.5 mg twice a day. He did have elevated creatinine in the setting of chronic kidney disease stage III and Lasix had been held with plans to restart Lasix by mouth post discharge.  He comes today feeling tired and sluggish. Sometimes he feels dizzy and lightheaded when he changes positions. He is back on lasix 20 mg BID. Sleeps in a chair as it is easier to breath. Has been for a couple of years    Past Medical History:  Diagnosis Date  . BPH (benign prostatic hyperplasia)   . CAD (coronary artery disease)    Multivessel status post CABG 2011 - LIMA to LAD, SVG to diagonal, SVG to OM, SVG to PDA  . Cellulitis    12/15  . Chronic back pain   . Chronic diastolic CHF (congestive heart failure) ([place])   . CKD (chronic kidney disease), stage III   . COPD (chronic obstructive pulmonary disease) ([place])   . Essential hypertension   . Gastric mass    EGD 9/15  . GERD (gastroesophageal reflux disease)   . History of DVT (deep vein thrombosis)    Postphlebitic syndrome  . History of kidney stones   . HOH (hard of hearing)   . Hx of CABG   . Hyperlipidemia   . Persistent atrial fibrillation ([place])    a. s/p DCCV 03/2016.  [name] Sleep apnea    Stop Bang score of 5  .  Type 2 diabetes mellitus ([place])     Past Surgical History:  Procedure Laterality Date  . CARDIOVERSION N/A 03/20/2016   Procedure: CARDIOVERSION;  Surgeon: Arnoldo Lenis, MD;  Location[facility];  Service: Endoscopy;  Laterality: N/A;  . CATARACT EXTRACTION W/PHACO Left 02/07/2016   Procedure: CATARACT EXTRACTION PHACO AND INTRAOCULAR LENS PLACEMENT (IOC);  Surgeon: [name], MD;  Location: [facility] ORS;  Service: Ophthalmology;  Laterality: Left;  CDE:  23.13  . COLONOSCOPY  2004   Dr. Laural Golden: three small polyps at cecum, path  unknown, external hemorrhoids  . COLONOSCOPY N/A 08/16/2013   Dr. [name]: incomplete prep. multiple tubular adenomas, multiple biopsies. Needs surveillance in Aug 2016 due to poor prep  . CORONARY ARTERY BYPASS GRAFT     x5  . CORONARY ARTERY BYPASS GRAFT  2011  . CYSTOSCOPY N/A 12/12/2012   Procedure: CYSTOSCOPY FLEXIBLE;  Surgeon: Marissa Nestle, MD;  Location: [facility] ORS;  Service: Urology;  Laterality: N/A;  . ESOPHAGOGASTRODUODENOSCOPY N/A 08/16/2013   Dr. [name]: normal esophagus s/p Maloney dilation, gastric erosions, submucosal gastric mass vs extrinsic mass  . EUS N/A 09/07/2013   Dr. 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 INCISION AND DRAINAGE ABSCESS N/A 12/20/2013   Procedure: INCISION AND DRAINAGE ABSCESS NECK;  Surgeon: Jamesetta So, MD;  Location: [facility] ORS;  Service: General;  Laterality: N/A;  . Venia Minks DILATION N/A 08/16/2013   Procedure: Keturah Shavers;  Surgeon: Daneil Dolin, MD;  Location: [facility] ENDO SUITE;  Service: Endoscopy;  Laterality: N/A;     Current Outpatient Prescriptions  Medication Sig Dispense Refill  . amLODipine-olmesartan (AZOR) 5-40 MG tablet Take 1 tablet by mouth daily.    [name] apixaban (ELIQUIS) 5 MG TABS tablet Take 5 mg by mouth 2 (two) times daily.    . cholecalciferol (VITAMIN D) 1000 units tablet Take 5,000 Units by mouth daily.    . cloNIDine (CATAPRES) 0.1 MG tablet Take 0.1 mg by mouth 2 (two) times daily.    [name] docusate sodium (COLACE) 100 MG capsule Take 100 mg by mouth 2 (two) times daily.    . furosemide (LASIX) 20 MG tablet Take 20 mg by mouth 2 (two) times daily.    . hydrocortisone cream 1 % Apply 1 application topically 2 (two) times daily.    . insulin glargine (LANTUS) 100 UNIT/ML injection Inject 0.5 mLs (50 Units total) into the skin at bedtime. 10 mL 11  . ipratropium-albuterol (DUONEB) 0.5-2.5 (3) MG/3ML SOLN Take 3 mLs by nebulization every 6 (six) hours as needed (wheezing).    . meclizine (ANTIVERT) 12.5 MG  tablet Take 12.5 mg by mouth 2 (two) times daily.    . metoprolol succinate (TOPROL-XL) 25 MG 24 hr tablet Take 3 tablets (75 mg total) by mouth daily. Take with or immediately following a meal. 90 tablet 4  . nitroGLYCERIN (NITROSTAT) 0.4 MG SL tablet Place 1 tablet (0.4 mg total) under the tongue every 5 (five) minutes as needed for chest pain. 30 tablet 12  . oxyCODONE (ROXICODONE) 15 MG immediate release tablet Take 15 mg by mouth 4 (four) times daily as needed for pain.    . pantoprazole (PROTONIX) 40 MG tablet Take 40 mg by mouth daily.    . potassium chloride (KLOR-CON) 8 MEQ tablet Take 1 tablet (8 mEq total) by mouth daily. 30 tablet 2  . prednisoLONE acetate (PRED FORTE) 1 % ophthalmic suspension Place 1 drop into the left eye 4 (four)  times daily.    . pregabalin (LYRICA) 50 MG capsule Take 50 mg by mouth daily.    . rosuvastatin (CRESTOR) 10 MG tablet Take 10 mg by mouth daily.    . silodosin (RAPAFLO) 8 MG CAPS capsule Take 8 mg by mouth daily with breakfast.    . timolol (BETIMOL) 0.5 % ophthalmic solution Place 1 drop into the left eye daily.     No current facility-administered medications for this visit.     Allergies:   Patient has no known allergies.    Social History:  The patient  reports that he quit smoking about 60 years ago. He has a 11.00 pack-year smoking history. He has never used smokeless tobacco. He reports that he does not drink alcohol or use drugs.   Family History:  The patient's family history includes Colon cancer (age of onset: 45) in his son.    ROS: All other systems are reviewed and negative. Unless otherwise mentioned in H&P    PHYSICAL EXAM: VS:  BP 118/60   Pulse (!) 115   Ht 5\' 10"  (1.778 m)   Wt 215 lb (97.5 kg)   SpO2 95%   BMI 30.85 kg/m  , BMI Body mass index is 30.85 kg/m. GEN: Well nourished, well developed, in no acute distress  HEENT: normal  Neck: no JVD, carotid bruits, or masses Cardiac: IRRR; no murmurs, rubs, or  gallops,no edema  Respiratory:  clear to auscultation bilaterally, normal work of breathing GI: soft, nontender, nondistended, + BS Obese.  MS: no deformity or atrophy Non pitting edema in the dependent position.  Skin: warm and dry, no rash Neuro:  Strength and sensation are intact Psych: euthymic mood, full affect   EKG:   The ekg ordered today demonstrates atrial fib. Rate of 88 bpm.    Recent Labs: 03/19/2016: TSH 1.288 03/21/2016: Magnesium 1.9 06/08/2016: ALT 19; B Natriuretic Peptide 1,759.0 06/14/2016: BUN 32; Potassium 4.3; Sodium 135 06/15/2016: Creatinine, Ser 1.47; Hemoglobin 11.8; Platelets 292    Lipid Panel    Component Value Date/Time   CHOL 154 03/19/2016 1222   TRIG 136 03/19/2016 1222   HDL 32 (L) 03/19/2016 1222   CHOLHDL 4.8 03/19/2016 1222   VLDL 27 03/19/2016 1222   LDLCALC 95 03/19/2016 1222      Wt Readings from Last 3 Encounters:  06/29/16 215 lb (97.5 kg)  06/15/16 222 lb (100.7 kg)  03/20/16 228 lb 14.4 oz (103.8 kg)     ASSESSMENT AND PLAN:  1. Atrial flutter with RVR: Tolerating metoprolol but does have some fatigue during the day. He is on a lot of medications in the a.m. which he takes all at once. I'm going to change his metoprolol 75 mg at bedtime dosing. Hopefully this will help some of his daytime somnolence and fatigue. I will repeat his CBC as he did have a history of some bleeding on ELIQUIS. Last hemoglobin was 11.9. He denies any further bleeding or hemoptysis.  Due to chronic dyspnea on exertion, daytime sleepiness, Consider a sleep study to evaluate for obstructive sleep apnea on follow-up  2. Hypertension: Low normal. May need to consider adjusting his medications further if he remains slightly fatigued and mildly dizzy with position change. He is on clonidine,  Metoprolol, Azor and lasix. We'll make small changes. We'll see him back in 6 weeks.  3. Coronary artery disease: He denies chest pain. Most recent nuclear medicine study  revealed no myocardial ischemia or scar and was found be a  low risk in February 2018. No plan cardiac testing.    Current medicines are reviewed at length with the patient today.    Labs/ tests ordered today include: BMET, CBC Phill Myron. [place] Pugh, ANP, AACC   06/29/2016 1:27 PM    [facility] [address] [place], [place] [address] Phone: [phone]; Fax: [phone]

## 2016-06-29 NOTE — Patient Instructions (Signed)
Your physician recommends that you schedule a follow-up appointment in: 6 weeks with Dr Purcell Nails   Get lab work now:cbc,bmet    Take metoprolol at night    No tests ordered today.     Thank you for choosing [facility] !

## 2016-07-09 ENCOUNTER — Telehealth: Payer: Self-pay | Admitting: *Deleted

## 2016-07-09 ENCOUNTER — Other Ambulatory Visit (HOSPITAL_COMMUNITY)
Admission: RE | Admit: 2016-07-09 | Discharge: 2016-07-09 | Disposition: A | Discharge status: Home / Self Care | Payer: Medicare Other | Source: Ambulatory Visit | Attending: Adult Health | Admitting: Adult Health

## 2016-07-09 DIAGNOSIS — I10 Essential (primary) hypertension: Secondary | ICD-10-CM | POA: Diagnosis present

## 2016-07-09 LAB — CBC
HCT: 38.9 % — ABNORMAL LOW (ref 39.0–52.0)
HEMOGLOBIN: 12.4 g/dL — AB (ref 13.0–17.0)
MCH: 27.5 pg (ref 26.0–34.0)
MCHC: 31.9 g/dL (ref 30.0–36.0)
MCV: 86.3 fL (ref 78.0–100.0)
Platelets: 190 10*3/uL (ref 150–400)
RBC: 4.51 MIL/uL (ref 4.22–5.81)
RDW: 14.3 % (ref 11.5–15.5)
WBC: 7.7 10*3/uL (ref 4.0–10.5)

## 2016-07-09 LAB — BASIC METABOLIC PANEL
ANION GAP: 10 (ref 5–15)
BUN: 31 mg/dL — ABNORMAL HIGH (ref 6–20)
CALCIUM: 9.2 mg/dL (ref 8.9–10.3)
CO2: 26 mmol/L (ref 22–32)
Chloride: 100 mmol/L — ABNORMAL LOW (ref 101–111)
Creatinine, Ser: 1.95 mg/dL — ABNORMAL HIGH (ref 0.61–1.24)
GFR, EST AFRICAN AMERICAN: 35 mL/min — AB (ref >60)
GFR, EST NON AFRICAN AMERICAN: 30 mL/min — AB (ref >60)
Glucose, Bld: 141 mg/dL — ABNORMAL HIGH (ref 65–99)
Potassium: 3.6 mmol/L (ref 3.5–5.1)
Sodium: 136 mmol/L (ref 135–145)

## 2016-07-09 MED ORDER — FUROSEMIDE 20 MG PO TABS: ORAL_TABLET | ORAL | 3 refills | Status: DC

## 2016-07-09 NOTE — Telephone Encounter (Signed)
-----   Message from Lendon Colonel, NP sent at 07/09/2016 12:54 PM EDT ----- Creatinine is slightly elevated on labs above baseline. He is on lasix 20 mg BID. Change to 20 mg in am alternating with BID every other day. Call for weight gain or fluid retention.

## 2016-07-24 ENCOUNTER — Emergency Department (HOSPITAL_COMMUNITY): Payer: Medicare Other

## 2016-07-24 ENCOUNTER — Inpatient Hospital Stay (HOSPITAL_COMMUNITY): Payer: Medicare Other

## 2016-07-24 ENCOUNTER — Encounter (HOSPITAL_COMMUNITY): Payer: Self-pay | Admitting: Emergency Medicine

## 2016-07-24 ENCOUNTER — Inpatient Hospital Stay (HOSPITAL_COMMUNITY)
Admission: EM | Admit: 2016-07-24 | Discharge: 2016-07-27 | DRG: 291 | Disposition: A | Discharge status: Home / Self Care | Payer: Medicare Other | Attending: Family Medicine | Admitting: Family Medicine

## 2016-07-24 DIAGNOSIS — N183 Chronic kidney disease, stage 3 unspecified: Secondary | ICD-10-CM

## 2016-07-24 DIAGNOSIS — Z860100 Personal history of colon polyps, unspecified: Secondary | ICD-10-CM

## 2016-07-24 DIAGNOSIS — J449 Chronic obstructive pulmonary disease, unspecified: Secondary | ICD-10-CM | POA: Diagnosis present

## 2016-07-24 DIAGNOSIS — R0602 Shortness of breath: Secondary | ICD-10-CM | POA: Diagnosis present

## 2016-07-24 DIAGNOSIS — I5033 Acute on chronic diastolic (congestive) heart failure: Secondary | ICD-10-CM

## 2016-07-24 DIAGNOSIS — Z7901 Long term (current) use of anticoagulants: Secondary | ICD-10-CM | POA: Diagnosis not present

## 2016-07-24 DIAGNOSIS — E119 Type 2 diabetes mellitus without complications: Secondary | ICD-10-CM

## 2016-07-24 DIAGNOSIS — Z951 Presence of aortocoronary bypass graft: Secondary | ICD-10-CM

## 2016-07-24 DIAGNOSIS — E785 Hyperlipidemia, unspecified: Secondary | ICD-10-CM | POA: Diagnosis present

## 2016-07-24 DIAGNOSIS — Z961 Presence of intraocular lens: Secondary | ICD-10-CM | POA: Diagnosis present

## 2016-07-24 DIAGNOSIS — Z9111 Patient's noncompliance with dietary regimen: Secondary | ICD-10-CM

## 2016-07-24 DIAGNOSIS — K219 Gastro-esophageal reflux disease without esophagitis: Secondary | ICD-10-CM | POA: Diagnosis present

## 2016-07-24 DIAGNOSIS — I10 Essential (primary) hypertension: Secondary | ICD-10-CM

## 2016-07-24 DIAGNOSIS — N400 Enlarged prostate without lower urinary tract symptoms: Secondary | ICD-10-CM | POA: Diagnosis present

## 2016-07-24 DIAGNOSIS — I130 Hypertensive heart and chronic kidney disease with heart failure and stage 1 through stage 4 chronic kidney disease, or unspecified chronic kidney disease: Secondary | ICD-10-CM | POA: Diagnosis present

## 2016-07-24 DIAGNOSIS — R0600 Dyspnea, unspecified: Secondary | ICD-10-CM

## 2016-07-24 DIAGNOSIS — Z6831 Body mass index (BMI) 31.0-31.9, adult: Secondary | ICD-10-CM

## 2016-07-24 DIAGNOSIS — K3189 Other diseases of stomach and duodenum: Secondary | ICD-10-CM

## 2016-07-24 DIAGNOSIS — G4730 Sleep apnea, unspecified: Secondary | ICD-10-CM | POA: Diagnosis present

## 2016-07-24 DIAGNOSIS — E1122 Type 2 diabetes mellitus with diabetic chronic kidney disease: Secondary | ICD-10-CM | POA: Diagnosis present

## 2016-07-24 DIAGNOSIS — R1319 Other dysphagia: Secondary | ICD-10-CM

## 2016-07-24 DIAGNOSIS — Z794 Long term (current) use of insulin: Secondary | ICD-10-CM

## 2016-07-24 DIAGNOSIS — I2510 Atherosclerotic heart disease of native coronary artery without angina pectoris: Secondary | ICD-10-CM | POA: Diagnosis present

## 2016-07-24 DIAGNOSIS — E669 Obesity, unspecified: Secondary | ICD-10-CM

## 2016-07-24 DIAGNOSIS — I481 Persistent atrial fibrillation: Secondary | ICD-10-CM | POA: Diagnosis present

## 2016-07-24 DIAGNOSIS — M79606 Pain in leg, unspecified: Secondary | ICD-10-CM

## 2016-07-24 DIAGNOSIS — I4819 Other persistent atrial fibrillation: Secondary | ICD-10-CM | POA: Diagnosis present

## 2016-07-24 DIAGNOSIS — R0902 Hypoxemia: Secondary | ICD-10-CM

## 2016-07-24 DIAGNOSIS — J181 Lobar pneumonia, unspecified organism: Secondary | ICD-10-CM

## 2016-07-24 DIAGNOSIS — R600 Localized edema: Secondary | ICD-10-CM | POA: Insufficient documentation

## 2016-07-24 DIAGNOSIS — E1165 Type 2 diabetes mellitus with hyperglycemia: Secondary | ICD-10-CM

## 2016-07-24 DIAGNOSIS — Z86718 Personal history of other venous thrombosis and embolism: Secondary | ICD-10-CM

## 2016-07-24 DIAGNOSIS — G8929 Other chronic pain: Secondary | ICD-10-CM | POA: Diagnosis present

## 2016-07-24 DIAGNOSIS — Z9114 Patient's other noncompliance with medication regimen: Secondary | ICD-10-CM

## 2016-07-24 DIAGNOSIS — J189 Pneumonia, unspecified organism: Secondary | ICD-10-CM

## 2016-07-24 DIAGNOSIS — R0989 Other specified symptoms and signs involving the circulatory and respiratory systems: Secondary | ICD-10-CM

## 2016-07-24 DIAGNOSIS — Z79899 Other long term (current) drug therapy: Secondary | ICD-10-CM

## 2016-07-24 DIAGNOSIS — R609 Edema, unspecified: Secondary | ICD-10-CM | POA: Insufficient documentation

## 2016-07-24 DIAGNOSIS — M7989 Other specified soft tissue disorders: Secondary | ICD-10-CM

## 2016-07-24 DIAGNOSIS — I509 Heart failure, unspecified: Secondary | ICD-10-CM

## 2016-07-24 DIAGNOSIS — J9601 Acute respiratory failure with hypoxia: Secondary | ICD-10-CM | POA: Diagnosis present

## 2016-07-24 DIAGNOSIS — R1310 Dysphagia, unspecified: Secondary | ICD-10-CM

## 2016-07-24 DIAGNOSIS — I5032 Chronic diastolic (congestive) heart failure: Secondary | ICD-10-CM | POA: Diagnosis present

## 2016-07-24 DIAGNOSIS — H9190 Unspecified hearing loss, unspecified ear: Secondary | ICD-10-CM | POA: Diagnosis present

## 2016-07-24 DIAGNOSIS — Z87891 Personal history of nicotine dependence: Secondary | ICD-10-CM

## 2016-07-24 LAB — CBC WITH DIFFERENTIAL/PLATELET
BASOS PCT: 0 %
Basophils Absolute: 0 10*3/uL (ref 0.0–0.1)
Eosinophils Absolute: 0.3 10*3/uL (ref 0.0–0.7)
Eosinophils Relative: 3 %
HEMATOCRIT: 36.2 % — AB (ref 39.0–52.0)
HEMOGLOBIN: 11.7 g/dL — AB (ref 13.0–17.0)
LYMPHS ABS: 0.7 10*3/uL (ref 0.7–4.0)
LYMPHS PCT: 6 %
MCH: 28.1 pg (ref 26.0–34.0)
MCHC: 32.3 g/dL (ref 30.0–36.0)
MCV: 87 fL (ref 78.0–100.0)
MONO ABS: 0.6 10*3/uL (ref 0.1–1.0)
Monocytes Relative: 6 %
NEUTROS ABS: 9.4 10*3/uL — AB (ref 1.7–7.7)
NEUTROS PCT: 85 %
Platelets: 195 10*3/uL (ref 150–400)
RBC: 4.16 MIL/uL — ABNORMAL LOW (ref 4.22–5.81)
RDW: 14.9 % (ref 11.5–15.5)
WBC: 11 10*3/uL — ABNORMAL HIGH (ref 4.0–10.5)

## 2016-07-24 LAB — COMPREHENSIVE METABOLIC PANEL
ALBUMIN: 3.9 g/dL (ref 3.5–5.0)
ALK PHOS: 112 U/L (ref 38–126)
ALT: 16 U/L — ABNORMAL LOW (ref 17–63)
ANION GAP: 10 (ref 5–15)
AST: 16 U/L (ref 15–41)
BILIRUBIN TOTAL: 1.3 mg/dL — AB (ref 0.3–1.2)
BUN: 24 mg/dL — AB (ref 6–20)
CALCIUM: 9.6 mg/dL (ref 8.9–10.3)
CO2: 27 mmol/L (ref 22–32)
Chloride: 101 mmol/L (ref 101–111)
Creatinine, Ser: 1.4 mg/dL — ABNORMAL HIGH (ref 0.61–1.24)
GFR calc Af Amer: 52 mL/min — ABNORMAL LOW (ref >60)
GFR, EST NON AFRICAN AMERICAN: 45 mL/min — AB (ref >60)
GLUCOSE: 232 mg/dL — AB (ref 65–99)
POTASSIUM: 4.5 mmol/L (ref 3.5–5.1)
Sodium: 138 mmol/L (ref 135–145)
TOTAL PROTEIN: 6.9 g/dL (ref 6.5–8.1)

## 2016-07-24 LAB — GLUCOSE, CAPILLARY
Glucose-Capillary: 245 mg/dL — ABNORMAL HIGH (ref 65–99)
Glucose-Capillary: 148 mg/dL — ABNORMAL HIGH (ref 65–99)

## 2016-07-24 LAB — TROPONIN I

## 2016-07-24 LAB — PROCALCITONIN: Procalcitonin: 0.16 ng/mL

## 2016-07-24 LAB — BRAIN NATRIURETIC PEPTIDE: B Natriuretic Peptide: 251 pg/mL — ABNORMAL HIGH (ref 0.0–100.0)

## 2016-07-24 MED ORDER — IPRATROPIUM-ALBUTEROL 0.5-2.5 (3) MG/3ML IN SOLN
3.0000 mL | Freq: Three times a day (TID) | RESPIRATORY_TRACT | Status: DC
  Administered 2016-07-25 – 2016-07-26 (×4): 3 mL via RESPIRATORY_TRACT
  Filled 2016-07-24 (×4): qty 3

## 2016-07-24 MED ORDER — DOCUSATE SODIUM 100 MG PO CAPS
100.0000 mg | ORAL_CAPSULE | Freq: Two times a day (BID) | ORAL | Status: DC
  Administered 2016-07-24 – 2016-07-27 (×6): 100 mg via ORAL
  Filled 2016-07-24 (×6): qty 1

## 2016-07-24 MED ORDER — APIXABAN 5 MG PO TABS
5.0000 mg | ORAL_TABLET | Freq: Two times a day (BID) | ORAL | Status: DC
  Administered 2016-07-24 – 2016-07-27 (×7): 5 mg via ORAL
  Filled 2016-07-24 (×5): qty 1
  Filled 2016-07-24: qty 2
  Filled 2016-07-24: qty 1

## 2016-07-24 MED ORDER — METOPROLOL SUCCINATE ER 50 MG PO TB24
75.0000 mg | ORAL_TABLET | Freq: Every day | ORAL | Status: DC
  Administered 2016-07-24 – 2016-07-27 (×4): 75 mg via ORAL
  Filled 2016-07-24 (×4): qty 1

## 2016-07-24 MED ORDER — IPRATROPIUM-ALBUTEROL 0.5-2.5 (3) MG/3ML IN SOLN
RESPIRATORY_TRACT | Status: AC
  Administered 2016-07-24: 3 mL via RESPIRATORY_TRACT
  Filled 2016-07-24: qty 3

## 2016-07-24 MED ORDER — MECLIZINE HCL 12.5 MG PO TABS
12.5000 mg | ORAL_TABLET | Freq: Two times a day (BID) | ORAL | Status: DC
  Administered 2016-07-24 – 2016-07-27 (×7): 12.5 mg via ORAL
  Filled 2016-07-24 (×7): qty 1

## 2016-07-24 MED ORDER — ROSUVASTATIN CALCIUM 10 MG PO TABS
10.0000 mg | ORAL_TABLET | Freq: Every day | ORAL | Status: DC
Start: 2016-07-24 — End: 2016-07-27
  Administered 2016-07-25 – 2016-07-27 (×3): 10 mg via ORAL
  Filled 2016-07-24 (×3): qty 1

## 2016-07-24 MED ORDER — TIMOLOL MALEATE 0.5 % OP SOLN
1.0000 [drp] | Freq: Every day | OPHTHALMIC | Status: DC
  Administered 2016-07-24 – 2016-07-27 (×4): 1 [drp] via OPHTHALMIC
  Filled 2016-07-24 (×2): qty 5

## 2016-07-24 MED ORDER — SODIUM CHLORIDE 0.9 % IV SOLN: 250.0000 mL | INTRAVENOUS | Status: DC | PRN

## 2016-07-24 MED ORDER — PANTOPRAZOLE SODIUM 40 MG PO TBEC
40.0000 mg | DELAYED_RELEASE_TABLET | Freq: Every day | ORAL | Status: DC
  Administered 2016-07-24 – 2016-07-27 (×4): 40 mg via ORAL
  Filled 2016-07-24 (×4): qty 1

## 2016-07-24 MED ORDER — IPRATROPIUM-ALBUTEROL 0.5-2.5 (3) MG/3ML IN SOLN
3.0000 mL | Freq: Three times a day (TID) | RESPIRATORY_TRACT | Status: DC
  Administered 2016-07-24 (×2): 3 mL via RESPIRATORY_TRACT
  Filled 2016-07-24: qty 3

## 2016-07-24 MED ORDER — CLONIDINE HCL 0.1 MG PO TABS
0.1000 mg | ORAL_TABLET | Freq: Two times a day (BID) | ORAL | Status: DC
  Administered 2016-07-24 – 2016-07-27 (×7): 0.1 mg via ORAL
  Filled 2016-07-24 (×7): qty 1

## 2016-07-24 MED ORDER — SODIUM CHLORIDE 0.9% FLUSH
3.0000 mL | Freq: Two times a day (BID) | INTRAVENOUS | Status: DC
  Administered 2016-07-24 – 2016-07-27 (×7): 3 mL via INTRAVENOUS

## 2016-07-24 MED ORDER — INSULIN ASPART 100 UNIT/ML ~~LOC~~ SOLN: 0.0000 [IU] | Freq: Every day | SUBCUTANEOUS | Status: DC

## 2016-07-24 MED ORDER — TAMSULOSIN HCL 0.4 MG PO CAPS
0.4000 mg | ORAL_CAPSULE | Freq: Every day | ORAL | Status: DC
  Administered 2016-07-25 – 2016-07-27 (×3): 0.4 mg via ORAL
  Filled 2016-07-24 (×3): qty 1

## 2016-07-24 MED ORDER — FUROSEMIDE 10 MG/ML IJ SOLN
40.0000 mg | Freq: Two times a day (BID) | INTRAMUSCULAR | Status: DC
  Administered 2016-07-24 – 2016-07-27 (×6): 40 mg via INTRAVENOUS
  Filled 2016-07-24 (×6): qty 4

## 2016-07-24 MED ORDER — NITROGLYCERIN 0.4 MG SL SUBL: 0.4000 mg | SUBLINGUAL_TABLET | SUBLINGUAL | Status: DC | PRN

## 2016-07-24 MED ORDER — ACETAMINOPHEN 325 MG PO TABS: 650.0000 mg | ORAL_TABLET | ORAL | Status: DC | PRN

## 2016-07-24 MED ORDER — AMLODIPINE BESYLATE 5 MG PO TABS
5.0000 mg | ORAL_TABLET | Freq: Every day | ORAL | Status: DC
  Administered 2016-07-24 – 2016-07-27 (×4): 5 mg via ORAL
  Filled 2016-07-24 (×4): qty 1

## 2016-07-24 MED ORDER — VITAMIN D 1000 UNITS PO TABS
5000.0000 [IU] | ORAL_TABLET | Freq: Every day | ORAL | Status: DC
  Administered 2016-07-24 – 2016-07-27 (×4): 5000 [IU] via ORAL
  Filled 2016-07-24 (×4): qty 5

## 2016-07-24 MED ORDER — INSULIN GLARGINE 100 UNIT/ML ~~LOC~~ SOLN
50.0000 [IU] | Freq: Every day | SUBCUTANEOUS | Status: DC
  Administered 2016-07-24 – 2016-07-26 (×3): 50 [IU] via SUBCUTANEOUS
  Filled 2016-07-24 (×4): qty 0.5

## 2016-07-24 MED ORDER — OXYCODONE HCL 5 MG PO TABS
15.0000 mg | ORAL_TABLET | Freq: Four times a day (QID) | ORAL | Status: DC | PRN
  Administered 2016-07-24 – 2016-07-25 (×2): 15 mg via ORAL
  Filled 2016-07-24 (×2): qty 3

## 2016-07-24 MED ORDER — SODIUM CHLORIDE 0.9% FLUSH: 3.0000 mL | INTRAVENOUS | Status: DC | PRN

## 2016-07-24 MED ORDER — PREGABALIN 50 MG PO CAPS
50.0000 mg | ORAL_CAPSULE | Freq: Every day | ORAL | Status: DC
  Administered 2016-07-24 – 2016-07-27 (×4): 50 mg via ORAL
  Filled 2016-07-24 (×4): qty 1

## 2016-07-24 MED ORDER — IPRATROPIUM-ALBUTEROL 0.5-2.5 (3) MG/3ML IN SOLN
3.0000 mL | Freq: Four times a day (QID) | RESPIRATORY_TRACT | Status: DC | PRN
Start: 2016-07-24 — End: 2016-07-27

## 2016-07-24 MED ORDER — PREDNISOLONE ACETATE 1 % OP SUSP
1.0000 [drp] | Freq: Four times a day (QID) | OPHTHALMIC | Status: DC
  Administered 2016-07-24 – 2016-07-27 (×9): 1 [drp] via OPHTHALMIC
  Filled 2016-07-24: qty 1

## 2016-07-24 MED ORDER — INSULIN ASPART 100 UNIT/ML ~~LOC~~ SOLN
0.0000 [IU] | Freq: Three times a day (TID) | SUBCUTANEOUS | Status: DC
  Administered 2016-07-24 – 2016-07-25 (×2): 7 [IU] via SUBCUTANEOUS
  Administered 2016-07-25: 4 [IU] via SUBCUTANEOUS
  Administered 2016-07-26: 7 [IU] via SUBCUTANEOUS
  Administered 2016-07-26 – 2016-07-27 (×4): 4 [IU] via SUBCUTANEOUS

## 2016-07-24 MED ORDER — FUROSEMIDE 10 MG/ML IJ SOLN
40.0000 mg | Freq: Once | INTRAMUSCULAR | Status: AC
  Administered 2016-07-24: 40 mg via INTRAVENOUS
  Filled 2016-07-24: qty 4

## 2016-07-24 MED ORDER — ONDANSETRON HCL 4 MG/2ML IJ SOLN
4.0000 mg | Freq: Four times a day (QID) | INTRAMUSCULAR | Status: DC | PRN
Start: 2016-07-24 — End: 2016-07-27

## 2016-07-24 NOTE — H&P (Addendum)
History and Physical  [name] FTD:[id number] DOB: [birth year] DOA: 07/24/2016   PCP: [name], MD   Patient coming from: Home  Chief Complaint: SOB  HPI:  [name] is a 83 y.o. male with medical history of dCHF, COPD, CKD 3, HTN, CAD, DM2, HLD, BPH presents with one week hx of sob.  The patient denies any fevers, chills, chest pain, nausea, vomiting, diarrhea, abdominal pain, dysuria, hematuria. He states that his last weight was 219 pounds on the morning of 08/01/2016. The patient saw his primary care provider in the morning of 07/24/2016. Because of concerns of his continued dyspnea, the patient was sent to the emergency department for further evaluation. The patient endorses compliance with all his medications, but does endorse some degree of dietary indiscretion.  In addition, the patient states that he is not so strict with his fluid intake. He denies any worsening orthopnea, PND, states that his lower extremity edema is not much improved since his last time in the hospital. He continues to complain of a nonproductive cough without hemoptysis.  The emergency department, the patient was noted to be hypoxic with a saturation of 83% on room air. He was afebrile and hemodynamically stable. Serum creatinine was 1.40. BMP was otherwise unremarkable. Hepatic enzymes were unremarkable. WBC was 11.0. BNP was 251. EKG shows atrial fibrillation with nonspecific T-wave changes. Chest x-ray showed interstitial prominence with small left pleural effusion. The patient was given furosemide 40 mg IV 1.  Assessment/Plan: Acute respiratory failure with hypoxia -This is multifactorial including decompensated CHF, OSA/OHS, COPD, and deconditioning -Presently stable on 3 L nasal cannula -Wean oxygen for saturation greater than 92%  Acute on chronic diastolic CHF -I feel that the patient has signed/symptoms more consistent with respiratory heart failure and likely underlying cor  pulmonale -Start furosemide 40 mg IV twice a day -06/09/2016 echocardiogram--EF 60-65%, mild MR, PASP 42 -Continue metoprolol succinate -Hesitated to use ARB/ACEi in setting of CKD 3  Persistent atrial fibrillation -Continue metoprolol succinate -Continue apixaban -CHADSVASc = 6  CKD stage III -Baseline creatinine 1.4-1.6 -Will need to tolerate worse renal function for improved respiratory status -Monitor renal function with diuresis  Diabetes mellitus type 2, uncontrolled with hyperglycemia -Restart Lantus -NovoLog sliding scale -03/19/2016 hemoglobin A1c 9.6  LE Edema and pain -venous duplex r/o DVT  COPD -Continue duo nebs  Essential hypertension -Continue clonidine, amlodipine, metoprolol succinate  Hyperlipidemia -Continue statin      Past Medical History:  Diagnosis Date  . BPH (benign prostatic hyperplasia)   . CAD (coronary artery disease)    Multivessel status post CABG 2011 - LIMA to LAD, SVG to diagonal, SVG to OM, SVG to PDA  . Cellulitis    12/15  . Chronic back pain   . Chronic diastolic CHF (congestive heart failure) ([place])   . CKD (chronic kidney disease), stage III   . COPD (chronic obstructive pulmonary disease) ([place])   . Essential hypertension   . Gastric mass    EGD 9/15  . GERD (gastroesophageal reflux disease)   . History of DVT (deep vein thrombosis)    Postphlebitic syndrome  . History of kidney stones   . HOH (hard of hearing)   . Hx of CABG   . Hyperlipidemia   . Persistent atrial fibrillation ([place])    a. s/p DCCV 03/2016.  [name] Sleep apnea    Stop Bang score of 5  . Type 2 diabetes mellitus [facility])    Past Surgical  History:  Procedure Laterality Date  . CARDIOVERSION N/A 03/20/2016   Procedure: CARDIOVERSION;  Surgeon: Arnoldo Lenis, MD;  Location[facility];  Service: Endoscopy;  Laterality: N/A;  . CATARACT EXTRACTION W/PHACO Left 02/07/2016   Procedure: CATARACT EXTRACTION PHACO AND INTRAOCULAR LENS PLACEMENT (IOC);   Surgeon: [name], MD;  Location: [facility] ORS;  Service: Ophthalmology;  Laterality: Left;  CDE:  23.13  . COLONOSCOPY  2004   Dr. Laural Golden: three small polyps at cecum, path unknown, external hemorrhoids  . COLONOSCOPY N/A 08/16/2013   Dr. [name]: incomplete prep. multiple tubular adenomas, multiple biopsies. Needs surveillance in Aug 2016 due to poor prep  . CORONARY ARTERY BYPASS GRAFT     x5  . CORONARY ARTERY BYPASS GRAFT  2011  . CYSTOSCOPY N/A 12/12/2012   Procedure: CYSTOSCOPY FLEXIBLE;  Surgeon: Marissa Nestle, MD;  Location: [facility] ORS;  Service: Urology;  Laterality: N/A;  . ESOPHAGOGASTRODUODENOSCOPY N/A 08/16/2013   Dr. [name]: normal esophagus s/p Maloney dilation, gastric erosions, submucosal gastric mass vs extrinsic mass  . EUS N/A 09/07/2013   Dr. 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 INCISION AND DRAINAGE ABSCESS N/A 12/20/2013   Procedure: INCISION AND DRAINAGE ABSCESS NECK;  Surgeon: Jamesetta So, MD;  Location: [facility] ORS;  Service: General;  Laterality: N/A;  . Venia Minks DILATION N/A 08/16/2013   Procedure: Keturah Shavers;  Surgeon: Daneil Dolin, MD;  Location: [facility];  Service: Endoscopy;  Laterality: N/A;   Social History:  reports that he quit smoking about 60 years ago. He has a 11.00 pack-year smoking history. He has never used smokeless tobacco. He reports that he does not drink alcohol or use drugs.   Family History  Problem Relation Age of Onset  . Colon cancer Son 64       deceased     No Known Allergies   Prior to Admission medications   Medication Sig Start Date End Date Taking? Authorizing Provider  amLODipine-olmesartan (AZOR) 5-40 MG tablet Take 1 tablet by mouth daily.    [provider]  apixaban (ELIQUIS) 5 MG TABS tablet Take 5 mg by mouth 2 (two) times daily.    [provider]  cholecalciferol (VITAMIN D) 1000 units tablet Take 5,000 Units by mouth daily.    [provider]  cloNIDine  (CATAPRES) 0.1 MG tablet Take 0.1 mg by mouth 2 (two) times daily.    [provider]  docusate sodium (COLACE) 100 MG capsule Take 100 mg by mouth 2 (two) times daily.    [provider]  furosemide (LASIX) 20 MG tablet TAKE 20 MG IN THE AM AND ALTERNATE 20 MG IN THE EVENING 07/09/16   Lendon Colonel, NP  hydrocortisone cream 1 % Apply 1 application topically 2 (two) times daily.    [provider]  insulin glargine (LANTUS) 100 UNIT/ML injection Inject 0.5 mLs (50 Units total) into the skin at bedtime. 06/15/16   [name], MD  ipratropium-albuterol (DUONEB) 0.5-2.5 (3) MG/3ML SOLN Take 3 mLs by nebulization every 6 (six) hours as needed (wheezing).    [provider]  meclizine (ANTIVERT) 12.5 MG tablet Take 12.5 mg by mouth 2 (two) times daily.    [provider]  metoprolol succinate (TOPROL-XL) 25 MG 24 hr tablet Take 3 tablets (75 mg total) by mouth daily. Take with or immediately following a meal. 06/16/16   [name], MD  nitroGLYCERIN (NITROSTAT) 0.4 MG SL tablet Place 1  tablet (0.4 mg total) under the tongue every 5 (five) minutes as needed for chest pain. 12/22/13   [name], MD  oxyCODONE (ROXICODONE) 15 MG immediate release tablet Take 15 mg by mouth 4 (four) times daily as needed for pain.    [provider]  pantoprazole (PROTONIX) 40 MG tablet Take 40 mg by mouth daily.    [provider]  potassium chloride (KLOR-CON) 8 MEQ tablet Take 1 tablet (8 mEq total) by mouth daily. 03/21/16   [name], MD  prednisoLONE acetate (PRED FORTE) 1 % ophthalmic suspension Place 1 drop into the left eye 4 (four) times daily.    [provider]  pregabalin (LYRICA) 50 MG capsule Take 50 mg by mouth daily.    [provider]  rosuvastatin (CRESTOR) 10 MG tablet Take 10 mg by mouth daily.    [provider]  silodosin (RAPAFLO) 8 MG CAPS capsule Take 8 mg by mouth daily with  breakfast.    [provider]  timolol (BETIMOL) 0.5 % ophthalmic solution Place 1 drop into the left eye daily.    [provider]    Review of Systems:  Constitutional:  No weight loss, night sweats, Fevers, chills, fatigue.  Head&Eyes: No headache.  No vision loss.  No eye pain or scotoma ENT:  No Difficulty swallowing,Tooth/dental problems,Sore throat,  No ear ache, post nasal drip,  Cardio-vascular:  No chest pain, Orthopnea, PND,  dizziness, palpitations  GI:  No  abdominal pain, nausea, vomiting, diarrhea, loss of appetite, hematochezia, melena, heartburn, indigestion, Resp:  No shortness of breath with exertion or at rest. No cough. No coughing up of blood .No wheezing.No chest wall deformity  Skin:  no rash or lesions.  GU:  no dysuria, change in color of urine, no urgency or frequency. No flank pain.  Musculoskeletal:  No joint pain or swelling. No decreased range of motion. No back pain.  Psych:  No change in mood or affect. No depression or anxiety. Neurologic: No headache, no dysesthesia, no focal weakness, no vision loss. No syncope  Physical Exam: Vitals:   07/24/16 1135 07/24/16 1201 07/24/16 1230 07/24/16 1300  BP:   (!) 153/74 (!) 156/75  Pulse: 91 80 (!) 103 (!) 108  Resp: 18 19 (!) 35 19  Temp:      TempSrc:      SpO2: 99% 100% 92% 98%  Weight:      Height:       General:  A&O x 3, NAD, nontoxic, pleasant/cooperative Head/Eye: No conjunctival hemorrhage, no icterus, [place]/AT, No nystagmus ENT:  No icterus,  No thrush, good dentition, no pharyngeal exudate Neck:  No masses, no lymphadenpathy, no bruits CV:  IRRR, no rub, no gallop, no S3 Lung:  Bibasilar crackles, left greater than right. Diminished breath sounds left base. Abdomen: soft/NT, +BS, nondistended, no peritoneal signs Ext: No cyanosis, No rashes, No petechiae, No lymphangitis, 2 + LE edema Neuro: CNII-XII intact, strength 4/5 in bilateral upper and lower extremities, no  dysmetria  Labs on Admission:  Basic Metabolic Panel:  Recent Labs Lab 07/24/16 1005  NA 138  K 4.5  CL 101  CO2 27  GLUCOSE 232*  BUN 24*  CREATININE 1.40*  CALCIUM 9.6   Liver Function Tests:  Recent Labs Lab 07/24/16 1005  AST 16  ALT 16*  ALKPHOS 112  BILITOT 1.3*  PROT 6.9  ALBUMIN 3.9   No results for input(s): LIPASE, AMYLASE in the last 168 hours. No results  for input(s): AMMONIA in the last 168 hours. CBC:  Recent Labs Lab 07/24/16 1005  WBC 11.0*  NEUTROABS 9.4*  HGB 11.7*  HCT 36.2*  MCV 87.0  PLT 195   Coagulation Profile: No results for input(s): INR, PROTIME in the last 168 hours. Cardiac Enzymes:  Recent Labs Lab 07/24/16 1005  TROPONINI <0.03   BNP: Invalid input(s): POCBNP CBG: No results for input(s): GLUCAP in the last 168 hours. Urine analysis:    Component Value Date/Time   COLORURINE YELLOW 12/19/2013 [place] 12/19/2013 1905   LABSPEC >1.030 (H) 12/19/2013 1905   PHURINE 5.0 12/19/2013 1905   GLUCOSEU NEGATIVE 12/19/2013 1905   HGBUR NEGATIVE 12/19/2013 1905   BILIRUBINUR NEGATIVE 12/19/2013 1905   KETONESUR NEGATIVE 12/19/2013 1905   PROTEINUR 100 (A) 12/19/2013 1905   UROBILINOGEN 0.2 12/19/2013 1905   NITRITE NEGATIVE 12/19/2013 1905   LEUKOCYTESUR NEGATIVE 12/19/2013 1905   Sepsis Labs: @LABRCNTIP (procalcitonin:4,lacticidven:4) )No results found for this or any previous visit (from the past 240 hour(s)).   Radiological Exams on Admission: Dg Chest 2 View  Result Date: 07/24/2016 CLINICAL DATA:  Shortness of breath and leg edema. EXAM: CHEST  2 VIEW COMPARISON:  Chest CT 06/10/2016 FINDINGS: Postsurgical changes from CABG. The cardiac silhouette is enlarged. Calcific atherosclerotic disease of the aorta. Diffuse increase of the interstitial markings. Left more than right lower lobe peribronchial airspace opacities. Left pleural effusion. Osseous structures are without acute abnormality. Soft  tissues are grossly normal. IMPRESSION: Enlarged cardiac silhouette with mild interstitial pulmonary edema. Aortic atherosclerosis. Small left pleural effusion. Bibasilar peribronchial airspace opacities, left greater than right, which may represent atelectasis versus consolidation. Electronically Signed   By: Fidela Salisbury M.D.   On: 07/24/2016 10:56    EKG: Independently reviewed. Atrial fib with nonspecific T wave changes    Time spent:60 minutes Code Status:   FULL Family Communication:  No Family at bedside Disposition Plan: expect 3-4 day hospitalization Consults called: none DVT Prophylaxis: apixaban  Darry Kelnhofer, DO  [facility] Pager [phone]  If 7PM-7AM, please contact night-coverage [email or web address] Password [email or web address] 07/24/2016, 1:44 PM

## 2016-07-24 NOTE — ED Notes (Signed)
To ultrasound

## 2016-07-24 NOTE — ED Notes (Signed)
Pt in Ultrasound

## 2016-07-24 NOTE — ED Provider Notes (Signed)
[facility] DEPT Provider Note   CSN: [id number] Arrival date & time: 07/24/16  3716     History   Chief Complaint Chief Complaint  Patient presents with  . Shortness of Breath  . Leg Swelling    HPI LEONEL MCCOLLUM is a 83 y.o. male.   Shortness of Breath  This is a new problem. The average episode lasts 6 days. The problem occurs continuously.The problem has been gradually worsening. Associated symptoms include cough, rash and leg swelling. Pertinent negatives include no fever. He has tried nothing for the symptoms.    Past Medical History:  Diagnosis Date  . BPH (benign prostatic hyperplasia)   . CAD (coronary artery disease)    Multivessel status post CABG 2011 - LIMA to LAD, SVG to diagonal, SVG to OM, SVG to PDA  . Cellulitis    12/15  . Chronic back pain   . Chronic diastolic CHF (congestive heart failure) ([place])   . CKD (chronic kidney disease), stage III   . COPD (chronic obstructive pulmonary disease) ([place])   . Essential hypertension   . Gastric mass    EGD 9/15  . GERD (gastroesophageal reflux disease)   . History of DVT (deep vein thrombosis)    Postphlebitic syndrome  . History of kidney stones   . HOH (hard of hearing)   . Hx of CABG   . Hyperlipidemia   . Persistent atrial fibrillation ([place])    a. s/p DCCV 03/2016.  [name] Sleep apnea    Stop Bang score of 5  . Type 2 diabetes mellitus [facility])     Patient Active Problem List   Diagnosis Date Noted  . Pressure injury of skin 06/11/2016  . Atrial fibrillation with normal ventricular rate ([place]) 06/10/2016  . Esophageal stricture 06/09/2016  . CAD in native artery 06/08/2016  . Chest pain 06/08/2016  . Persistent atrial fibrillation ([place]) 06/08/2016  . CKD (chronic kidney disease), stage III 06/08/2016  . Acute on chronic diastolic CHF (congestive heart failure) ([place]) 03/19/2016  . CAP (community acquired pneumonia) 03/19/2016  . Abnormal weight loss   . Cellulitis and abscess 12/18/2013  .  Thrush, oral 12/18/2013  . Chest pain at rest 12/18/2013  . Leukocytosis 12/18/2013  . Diabetes ([place]) 12/18/2013  . Chronic back pain 12/18/2013  . Pulmonary vascular congestion 12/18/2013  . Dysphagia 12/18/2013  . Odynophagia 12/18/2013  . Gastric mass 09/07/2013  . Personal history of colonic polyps 08/05/2013  . Dysphagia, unspecified(787.20) 08/01/2013  . Nausea and vomiting 08/01/2013  . UTI (lower urinary tract infection) 12/09/2012  . Lower extremity edema 06/02/2012  . Dyspnea 06/02/2012  . Essential hypertension 06/02/2012  . History of DVT (deep vein thrombosis) 06/02/2012  . Obesity (BMI 30-39.9): BMI 31.3 06/02/2012    Past Surgical History:  Procedure Laterality Date  . CARDIOVERSION N/A 03/20/2016   Procedure: CARDIOVERSION;  Surgeon: Arnoldo Lenis, MD;  Location: [facility] ENDO SUITE;  Service: Endoscopy;  Laterality: N/A;  . CATARACT EXTRACTION W/PHACO Left 02/07/2016   Procedure: CATARACT EXTRACTION PHACO AND INTRAOCULAR LENS PLACEMENT (IOC);  Surgeon: [name], MD;  Location: [facility] ORS;  Service: Ophthalmology;  Laterality: Left;  CDE:  23.13  . COLONOSCOPY  2004   Dr. Laural Golden: three small polyps at cecum, path unknown, external hemorrhoids  . COLONOSCOPY N/A 08/16/2013   Dr. [name]: incomplete prep. multiple tubular adenomas, multiple biopsies. Needs surveillance in Aug 2016 due to poor prep  . CORONARY ARTERY BYPASS GRAFT     x5  .  CORONARY ARTERY BYPASS GRAFT  2011  . CYSTOSCOPY N/A 12/12/2012   Procedure: CYSTOSCOPY FLEXIBLE;  Surgeon: Marissa Nestle, MD;  Location: [facility] ORS;  Service: Urology;  Laterality: N/A;  . ESOPHAGOGASTRODUODENOSCOPY N/A 08/16/2013   Dr. [name]: normal esophagus s/p Maloney dilation, gastric erosions, submucosal gastric mass vs extrinsic mass  . EUS N/A 09/07/2013   Dr. 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 INCISION AND DRAINAGE ABSCESS N/A 12/20/2013   Procedure: INCISION AND DRAINAGE ABSCESS NECK;  Surgeon: Jamesetta So, MD;  Location: [facility] ORS;  Service: General;  Laterality: N/A;  . Venia Minks DILATION N/A 08/16/2013   Procedure: Keturah Shavers;  Surgeon: Daneil Dolin, MD;  Location: [facility] ENDO SUITE;  Service: Endoscopy;  Laterality: N/A;       Home Medications    Prior to Admission medications   Medication Sig Start Date End Date Taking? Authorizing Provider  amLODipine-olmesartan (AZOR) 5-40 MG tablet Take 1 tablet by mouth daily.    [provider]  apixaban (ELIQUIS) 5 MG TABS tablet Take 5 mg by mouth 2 (two) times daily.    [provider]  cholecalciferol (VITAMIN D) 1000 units tablet Take 5,000 Units by mouth daily.    [provider]  cloNIDine (CATAPRES) 0.1 MG tablet Take 0.1 mg by mouth 2 (two) times daily.    [provider]  docusate sodium (COLACE) 100 MG capsule Take 100 mg by mouth 2 (two) times daily.    [provider]  furosemide (LASIX) 20 MG tablet TAKE 20 MG IN THE AM AND ALTERNATE 20 MG IN THE EVENING 07/09/16   Lendon Colonel, NP  hydrocortisone cream 1 % Apply 1 application topically 2 (two) times daily.    [provider]  insulin glargine (LANTUS) 100 UNIT/ML injection Inject 0.5 mLs (50 Units total) into the skin at bedtime. 06/15/16   [name], MD  ipratropium-albuterol (DUONEB) 0.5-2.5 (3) MG/3ML SOLN Take 3 mLs by nebulization every 6 (six) hours as needed (wheezing).    [provider]  meclizine (ANTIVERT) 12.5 MG tablet Take 12.5 mg by mouth 2 (two) times daily.    [provider]  metoprolol succinate (TOPROL-XL) 25 MG 24 hr tablet Take 3 tablets (75 mg total) by mouth daily. Take with or immediately following a meal. 06/16/16   [name], MD  nitroGLYCERIN (NITROSTAT) 0.4 MG SL tablet Place 1 tablet (0.4 mg total) under the tongue every 5 (five) minutes as needed for chest pain. 12/22/13   [name], MD  oxyCODONE (ROXICODONE) 15 MG immediate release tablet Take 15 mg  by mouth 4 (four) times daily as needed for pain.    [provider]  pantoprazole (PROTONIX) 40 MG tablet Take 40 mg by mouth daily.    [provider]  potassium chloride (KLOR-CON) 8 MEQ tablet Take 1 tablet (8 mEq total) by mouth daily. 03/21/16   [name], MD  prednisoLONE acetate (PRED FORTE) 1 % ophthalmic suspension Place 1 drop into the left eye 4 (four) times daily.    [provider]  pregabalin (LYRICA) 50 MG capsule Take 50 mg by mouth daily.    [provider]  rosuvastatin (CRESTOR) 10 MG tablet Take 10 mg by mouth daily.    [provider]  silodosin (RAPAFLO) 8 MG CAPS capsule Take 8 mg by mouth daily with breakfast.    [provider]  timolol (BETIMOL) 0.5 % ophthalmic solution Place 1 drop into  the left eye daily.    [provider]    Family History Family History  Problem Relation Age of Onset  . Colon cancer Son 77       deceased    Social History Social History  Substance Use Topics  . Smoking status: Former Smoker    Packs/day: 1.00    Years: 11.00    Quit date: 01/05/1956  . Smokeless tobacco: Never Used  . Alcohol use No     Allergies   Patient has no known allergies.   Review of Systems Review of Systems  Constitutional: Negative for fever.  Respiratory: Positive for cough and shortness of breath.   Cardiovascular: Positive for leg swelling.  Skin: Positive for rash.  All other systems reviewed and are negative.    Physical Exam Updated Vital Signs BP (!) 156/75   Pulse (!) 108   Temp 97.8 F (36.6 C) (Oral)   Resp 19   Ht 5\' 11"  (1.803 m)   Wt 99.3 kg (219 lb)   SpO2 98%   BMI 30.54 kg/m   Physical Exam  Constitutional: He is oriented to person, place, and time. He appears well-developed and well-nourished.  HENT:  Head: Normocephalic and atraumatic.  Eyes: Conjunctivae and EOM are normal.  Neck: Normal range of motion.  Cardiovascular: Normal rate.     Pulmonary/Chest: Effort normal. Tachypnea noted. No respiratory distress. He has wheezes. He has rales.  Abdominal: Soft. He exhibits no distension.  Musculoskeletal: Normal range of motion. He exhibits edema.  Neurological: He is alert and oriented to person, place, and time. No cranial nerve deficit. Coordination normal.  Nursing note and vitals reviewed.    ED Treatments / Results  Labs (all labs ordered are listed, but only abnormal results are displayed) Labs Reviewed  CBC WITH DIFFERENTIAL/PLATELET - Abnormal; Notable for the following:       Result Value   WBC 11.0 (*)    RBC 4.16 (*)    Hemoglobin 11.7 (*)    HCT 36.2 (*)    Neutro Abs 9.4 (*)    All other components within normal limits  COMPREHENSIVE METABOLIC PANEL - Abnormal; Notable for the following:    Glucose, Bld 232 (*)    BUN 24 (*)    Creatinine, Ser 1.40 (*)    ALT 16 (*)    Total Bilirubin 1.3 (*)    GFR calc non Af Amer 45 (*)    GFR calc Af Amer 52 (*)    All other components within normal limits  BRAIN NATRIURETIC PEPTIDE - Abnormal; Notable for the following:    B Natriuretic Peptide 251.0 (*)    All other components within normal limits  TROPONIN I    EKG  EKG Interpretation  Date/Time:  Friday July 24 2016 09:55:13 EDT Ventricular Rate:  110 PR Interval:    QRS Duration: 85 QT Interval:  389 QTC Calculation: 527 R Axis:   100 Text Interpretation:  Atrial fibrillation Right axis deviation Low voltage, extremity and precordial leads Prolonged QT interval No significant change since last tracing Confirmed by Merrily Pew [id number]) on 07/24/2016 1:55:11 PM       Radiology Dg Chest 2 View  Result Date: 07/24/2016 CLINICAL DATA:  Shortness of breath and leg edema. EXAM: CHEST  2 VIEW COMPARISON:  Chest CT 06/10/2016 FINDINGS: Postsurgical changes from CABG. The cardiac silhouette is enlarged. Calcific atherosclerotic disease of the aorta. Diffuse increase of the interstitial markings. Left  more than right lower  lobe peribronchial airspace opacities. Left pleural effusion. Osseous structures are without acute abnormality. Soft tissues are grossly normal. IMPRESSION: Enlarged cardiac silhouette with mild interstitial pulmonary edema. Aortic atherosclerosis. Small left pleural effusion. Bibasilar peribronchial airspace opacities, left greater than right, which may represent atelectasis versus consolidation. Electronically Signed   By: Fidela Salisbury M.D.   On: 07/24/2016 10:56    Procedures Procedures (including critical care time)  CRITICAL CARE Performed by: Merrily Pew Total critical care time: 35 minutes Critical care time was exclusive of separately billable procedures and treating other patients. Critical care was necessary to treat or prevent imminent or life-threatening deterioration. Critical care was time spent personally by me on the following activities: development of treatment plan with patient and/or surrogate as well as nursing, discussions with consultants, evaluation of patient's response to treatment, examination of patient, obtaining history from patient or surrogate, ordering and performing treatments and interventions, ordering and review of laboratory studies, ordering and review of radiographic studies, pulse oximetry and re-evaluation of patient's condition.   Medications Ordered in ED Medications  furosemide (LASIX) injection 40 mg (40 mg Intravenous Given 07/24/16 1119)     Initial Impression / Assessment and Plan / ED Course  I have reviewed the triage vital signs and the nursing notes.  Pertinent labs & imaging results that were available during my care of the patient were reviewed by me and considered in my medical decision making (see chart for details).     This is an 83 year old male with a history of CHF who presents to the emergency department today with a week's worth of progressively worsening lower extremity edema, orthopnea and  shortness of breath. Initially found to be hypoxic and tachypneic in mild respiratory distress. Had crackles in both lung fields and 2+ lower extremity edema. Chest x-ray consistent with same. Give 40 of IV Lasix with some improvement in his symptoms. Nasal cannula oxygen applied at 2 L with sats improved to the mid 90s. The rest of his labs are consistent with likely CHF. Secondary to hypoxia will admit to hospital, discussed with Dr. Carles Collet regarding the same.  Final Clinical Impressions(s) / ED Diagnoses   Final diagnoses:  Peripheral edema  Leg swelling  Shortness of breath  Hypoxia  Congestive heart failure, unspecified HF chronicity, unspecified heart failure type [facility])     Kayan Blissett, Corene Cornea, MD 07/24/16 1356

## 2016-07-24 NOTE — ED Notes (Addendum)
Pt up to bedside commode. Dyspnea noted. O2 saturation 83%, pt placed on O2 at 2l. O2 saturation 94% on O2 at 2L upon returning to stretcher.

## 2016-07-24 NOTE — ED Triage Notes (Signed)
Pt reports shortness of breath with increased edema to legs for a week.  Unable to lay flat at night.  Also c/o rash on legs with itching.

## 2016-07-25 DIAGNOSIS — R0602 Shortness of breath: Secondary | ICD-10-CM | POA: Diagnosis not present

## 2016-07-25 DIAGNOSIS — I130 Hypertensive heart and chronic kidney disease with heart failure and stage 1 through stage 4 chronic kidney disease, or unspecified chronic kidney disease: Secondary | ICD-10-CM | POA: Diagnosis not present

## 2016-07-25 LAB — GLUCOSE, CAPILLARY
GLUCOSE-CAPILLARY: 217 mg/dL — AB (ref 65–99)
GLUCOSE-CAPILLARY: 159 mg/dL — AB (ref 65–99)
Glucose-Capillary: 119 mg/dL — ABNORMAL HIGH (ref 65–99)
Glucose-Capillary: 200 mg/dL — ABNORMAL HIGH (ref 65–99)

## 2016-07-25 LAB — BASIC METABOLIC PANEL
Anion gap: 9 (ref 5–15)
BUN: 25 mg/dL — AB (ref 6–20)
CALCIUM: 9.5 mg/dL (ref 8.9–10.3)
CO2: 31 mmol/L (ref 22–32)
Chloride: 105 mmol/L (ref 101–111)
Creatinine, Ser: 1.57 mg/dL — ABNORMAL HIGH (ref 0.61–1.24)
GFR calc Af Amer: 46 mL/min — ABNORMAL LOW (ref >60)
GFR, EST NON AFRICAN AMERICAN: 39 mL/min — AB (ref >60)
GLUCOSE: 143 mg/dL — AB (ref 65–99)
Potassium: 4 mmol/L (ref 3.5–5.1)
Sodium: 145 mmol/L (ref 135–145)

## 2016-07-25 LAB — MAGNESIUM: Magnesium: 2 mg/dL (ref 1.7–2.4)

## 2016-07-26 DIAGNOSIS — R0602 Shortness of breath: Secondary | ICD-10-CM | POA: Diagnosis not present

## 2016-07-26 DIAGNOSIS — I130 Hypertensive heart and chronic kidney disease with heart failure and stage 1 through stage 4 chronic kidney disease, or unspecified chronic kidney disease: Secondary | ICD-10-CM | POA: Diagnosis not present

## 2016-07-26 LAB — BASIC METABOLIC PANEL
Anion gap: 8 (ref 5–15)
BUN: 33 mg/dL — AB (ref 6–20)
CHLORIDE: 101 mmol/L (ref 101–111)
CO2: 30 mmol/L (ref 22–32)
Calcium: 9.7 mg/dL (ref 8.9–10.3)
Creatinine, Ser: 1.96 mg/dL — ABNORMAL HIGH (ref 0.61–1.24)
GFR calc Af Amer: 35 mL/min — ABNORMAL LOW (ref >60)
GFR calc non Af Amer: 30 mL/min — ABNORMAL LOW (ref >60)
Glucose, Bld: 218 mg/dL — ABNORMAL HIGH (ref 65–99)
POTASSIUM: 4.4 mmol/L (ref 3.5–5.1)
SODIUM: 139 mmol/L (ref 135–145)

## 2016-07-26 LAB — GLUCOSE, CAPILLARY
GLUCOSE-CAPILLARY: 132 mg/dL — AB (ref 65–99)
Glucose-Capillary: 212 mg/dL — ABNORMAL HIGH (ref 65–99)
Glucose-Capillary: 168 mg/dL — ABNORMAL HIGH (ref 65–99)
Glucose-Capillary: 160 mg/dL — ABNORMAL HIGH (ref 65–99)

## 2016-07-26 LAB — PROCALCITONIN: PROCALCITONIN: 0.19 ng/mL

## 2016-07-26 MED ORDER — IPRATROPIUM-ALBUTEROL 0.5-2.5 (3) MG/3ML IN SOLN: 3.0000 mL | Freq: Two times a day (BID) | RESPIRATORY_TRACT | Status: DC

## 2016-07-26 NOTE — Progress Notes (Signed)
Pt ambulated 200 ft with pulse oximetry on room air.  O2 sat at rest 97%, while ambulating 93-96%,  and at rest again in room, 99%.  Pulse was variable, between 36-80 bpm.

## 2016-07-26 NOTE — Progress Notes (Signed)
Patient admitted with a one-week history of increasing dyspnea O2 sats at 77% diastolic congestive heart failure status post CABG 3 hypertension hyperlipidemia insulin-dependent diabetes he is on increased diuresis with Lasix IV 40 mg twice a day we'll monitor electrolytes and magnesium in a.m. he continues on 3 L O2 with adequate saturations TRACEY STEWART OEU:[id number] DOB: 06-Feb-1931 DOA: 07/24/2016 PCP: [name], MD   Physical Exam: Blood pressure (!) 98/59, pulse 73, temperature 97.6 F (36.4 C), temperature source Oral, resp. rate 16, height 5\' 11"  (1.803 m), weight 98.8 kg (217 lb 13 oz), SpO2 96 %. Lungs show diminished breath sounds in the bases prolonged expiratory phase no rales no wheezes audible heart irregular rhythm no S3 noted no heaves thrills rubs some pedal edema noted left greater than right leg venous Dopplers pending   Investigations:  No results found for this or any previous visit (from the past 240 hour(s)).   Basic Metabolic Panel:  Recent Labs  07/25/16 0528 07/26/16 0714  NA 145 139  K 4.0 4.4  CL 105 101  CO2 31 30  GLUCOSE 143* 218*  BUN 25* 33*  CREATININE 1.57* 1.96*  CALCIUM 9.5 9.7  MG 2.0  --    Liver Function Tests:  Recent Labs  07/24/16 1005  AST 16  ALT 16*  ALKPHOS 112  BILITOT 1.3*  PROT 6.9  ALBUMIN 3.9     CBC:  Recent Labs  07/24/16 1005  WBC 11.0*  NEUTROABS 9.4*  HGB 11.7*  HCT 36.2*  MCV 87.0  PLT 195    US Venous Img Lower Bilateral  Result Date: 07/24/2016 CLINICAL DATA:  Bilateral leg wounds EXAM: BILATERAL LOWER EXTREMITY VENOUS DOPPLER ULTRASOUND TECHNIQUE: Gray-scale sonography with graded compression, as well as color Doppler and duplex ultrasound were performed to evaluate the lower extremity deep venous systems from the level of the common femoral vein and including the common femoral, femoral, profunda femoral, popliteal and calf veins including the posterior tibial, peroneal and gastrocnemius  veins when visible. The superficial great saphenous vein was also interrogated. Spectral Doppler was utilized to evaluate flow at rest and with distal augmentation maneuvers in the common femoral, femoral and popliteal veins. COMPARISON:  None. FINDINGS: RIGHT LOWER EXTREMITY Common Femoral Vein: No evidence of thrombus. Normal compressibility, respiratory phasicity and response to augmentation. Saphenofemoral Junction: No evidence of thrombus. Normal compressibility and flow on color Doppler imaging. Profunda Femoral Vein: No evidence of thrombus. Normal compressibility and flow on color Doppler imaging. Femoral Vein: No evidence of thrombus. Normal compressibility, respiratory phasicity and response to augmentation. Popliteal Vein: No evidence of thrombus. Normal compressibility, respiratory phasicity and response to augmentation. Calf Veins: No evidence of thrombus. Normal compressibility and flow on color Doppler imaging. Superficial Great Saphenous Vein: No evidence of thrombus. Normal compressibility and flow on color Doppler imaging. Venous Reflux:  None. Other Findings:  Calf edema is noted. LEFT LOWER EXTREMITY Common Femoral Vein: No evidence of thrombus. Normal compressibility, respiratory phasicity and response to augmentation. Saphenofemoral Junction: No evidence of thrombus. Normal compressibility and flow on color Doppler imaging. Profunda Femoral Vein: No evidence of thrombus. Normal compressibility and flow on color Doppler imaging. Femoral Vein: No evidence of thrombus. Normal compressibility, respiratory phasicity and response to augmentation. Popliteal Vein: No evidence of thrombus. Normal compressibility, respiratory phasicity and response to augmentation. Calf Veins: No evidence of thrombus. Normal compressibility and flow on color Doppler imaging. Superficial Great Saphenous Vein: No evidence of thrombus. Normal compressibility and flow on color  Doppler imaging. Venous Reflux:  None. Other  Findings:  Calf edema is noted. IMPRESSION: No evidence of DVT within either lower extremity. Bilateral calf edema Electronically Signed   By: Inez Catalina M.D.   On: 07/24/2016 15:41      Medications: }  Impression:  Active Problems:   Obesity (BMI 30-39.9): BMI 31.3   Acute on chronic diastolic CHF (congestive heart failure) (H[facility])   Persistent atrial fibrillation ([place])   Type 2 diabetes mellitus with hyperglycemia, with long-term current use of insulin (HCC)     Plan: Continue IV diuresis Lasix 40 twice a day monitor renal function potassium and magnesium observe clinical response continue 3 L nasal O2  Consultants:     Procedures   Antibiotics:     Time spent: 30 minutes   LOS: 2 days   Merisa Julio M   07/26/2016, 12:33 PM

## 2016-07-27 DIAGNOSIS — I130 Hypertensive heart and chronic kidney disease with heart failure and stage 1 through stage 4 chronic kidney disease, or unspecified chronic kidney disease: Secondary | ICD-10-CM | POA: Diagnosis not present

## 2016-07-27 DIAGNOSIS — R0602 Shortness of breath: Secondary | ICD-10-CM | POA: Diagnosis not present

## 2016-07-27 LAB — BASIC METABOLIC PANEL
ANION GAP: 9 (ref 5–15)
BUN: 42 mg/dL — ABNORMAL HIGH (ref 6–20)
CHLORIDE: 101 mmol/L (ref 101–111)
CO2: 30 mmol/L (ref 22–32)
CREATININE: 2.19 mg/dL — AB (ref 0.61–1.24)
Calcium: 9.1 mg/dL (ref 8.9–10.3)
GFR calc Af Amer: 31 mL/min — ABNORMAL LOW (ref >60)
GFR calc non Af Amer: 26 mL/min — ABNORMAL LOW (ref >60)
Glucose, Bld: 161 mg/dL — ABNORMAL HIGH (ref 65–99)
Potassium: 4.2 mmol/L (ref 3.5–5.1)
Sodium: 140 mmol/L (ref 135–145)

## 2016-07-27 LAB — GLUCOSE, CAPILLARY
GLUCOSE-CAPILLARY: 166 mg/dL — AB (ref 65–99)
GLUCOSE-CAPILLARY: 155 mg/dL — AB (ref 65–99)

## 2016-07-27 LAB — MAGNESIUM: Magnesium: 2.1 mg/dL (ref 1.7–2.4)

## 2016-07-27 MED ORDER — FUROSEMIDE 20 MG PO TABS: 20.0000 mg | ORAL_TABLET | Freq: Two times a day (BID) | ORAL | Status: DC

## 2016-07-27 NOTE — Care Management Note (Addendum)
Case Management Note  Patient Details  Name: [name] MRN: [id number] Date of Birth: December 05, 1931  Subjective/Objective:                  Pt admitted with CHF. He is from home, lives alone and has daughter who is very supportive. Pt is ind with ADL's. He drives himself to appointments. He has PCP and insurance with drug coverage. He has neb machine and does not need supplemental oxygen. He has scale and weighs himself daily. Pt recommends no f/u. Pt communicates no needs.   Action/Plan: DC home today with self care.   Expected Discharge Date:  07/27/16               Expected Discharge Plan:  Home/Self Care  In-House Referral:  NA  Discharge planning Services  CM Consult  Post Acute Care Choice:  NA Choice offered to:  NA  Status of Service:  Completed, signed off  Sherald Barge, RN 07/27/2016, 1:24 PM

## 2016-07-27 NOTE — Care Management Important Message (Deleted)
Important Message  Patient Details  Name: [name] MRN: [id number] Date of Birth: Feb 11, 1931   Medicare Important Message Given:  Yes    Sherald Barge, RN 07/27/2016, 1:27 PM

## 2016-07-27 NOTE — Care Management Important Message (Signed)
Important Message  Patient Details  Name: [name] MRN: [id number] Date of Birth: 1931/01/27   Medicare Important Message Given:  Yes    Sherald Barge, RN 07/27/2016, 1:23 PM

## 2016-07-27 NOTE — Progress Notes (Signed)
IV removed, WNL. D/C instructions given to pt. Verbalized understanding. Pt wife at bedside to transport home.  

## 2016-07-27 NOTE — Progress Notes (Signed)
Physician Discharge Summary  ABUBAKR WIEMAN LDJ:[id number] DOB: Jun 15, 1931 DOA: 07/24/2016  PCP: [name], MD  Admit date: 07/24/2016 Discharge date: 07/27/2016   Recommendations for Outpatient Follow-up:  Patient is advised to take Lasix 20 mg by mouth twice daily as well as potassium chloride 8 mEq by mouth once daily and follow-up in the office in one week's time. He is likewise advised to take all other medicines including 50 units of insulin at bedtime as well as eloquent 5 mg by mouth twice a day Discharge Diagnoses:  Active Problems:   Obesity (BMI 30-39.9): BMI 31.3   Acute on chronic diastolic CHF (congestive heart failure) (HCC)   Persistent atrial fibrillation ([place])   Type 2 diabetes mellitus with hyperglycemia, with long-term current use of insulin [facility])   Discharge Condition: Good  Filed Weights   07/26/16 0519 07/26/16 1547 07/27/16 0656  Weight: 98.8 kg (217 lb 13 oz) 97.9 kg (215 lb 12.8 oz) 97.7 kg (215 lb 6.2 oz)    History of present illness:  Patient each-year-old white male with multiple issues with chronic diastolic dysfunction coronary artery disease status post CABG 3 hypertension hyperlipidemia insulin-dependent diabetes inability to read and follow off his medicine directions on bottles. Patient will have increasing dyspnea over an 8 day. He was found to have pulmonary vascular congestion 2-D echo revealed several months ago systolic function and between cyst 60 and 65 ejection fraction with diastolic dysfunction and diuresis increased with Lasix 40 by mouth IV twice a day he responded on admission his O2 sat was 83% he jumped to 93% at rest and did not desaturate while walking in the hallways without nasal O2. The patient was discharged on oral Lasix 20 mg twice a day  Hospital Course:  See history of present illness above  Procedures:    Consultations:    Discharge Instructions  Discharge Instructions    Discharge instructions     Complete by:  As directed    Discharge patient    Complete by:  As directed    Discharge disposition:  01-Home or Self Care   Discharge patient date:  07/27/2016     Allergies as of 07/27/2016   No Known Allergies     Medication List    TAKE these medications   AZOR 5-40 MG tablet Generic drug:  amLODipine-olmesartan Take 1 tablet by mouth daily.   cholecalciferol 1000 units tablet Commonly known as:  VITAMIN D Take 5,000 Units by mouth daily.   cloNIDine 0.1 MG tablet Commonly known as:  CATAPRES Take 0.1 mg by mouth 2 (two) times daily.   docusate sodium 100 MG capsule Commonly known as:  COLACE Take 100 mg by mouth 2 (two) times daily.   ELIQUIS 5 MG Tabs tablet Generic drug:  apixaban Take 5 mg by mouth 2 (two) times daily.   furosemide 20 MG tablet Commonly known as:  LASIX TAKE 20 MG IN THE AM AND ALTERNATE 20 MG IN THE EVENING   hydrocortisone cream 1 % Apply 1 application topically 2 (two) times daily.   insulin glargine 100 UNIT/ML injection Commonly known as:  LANTUS Inject 0.5 mLs (50 Units total) into the skin at bedtime.   ipratropium-albuterol 0.5-2.5 (3) MG/3ML Soln Commonly known as:  DUONEB Take 3 mLs by nebulization every 6 (six) hours as needed (wheezing).   meclizine 12.5 MG tablet Commonly known as:  ANTIVERT Take 12.5 mg by mouth 2 (two) times daily.   metoprolol succinate 25 MG 24  hr tablet Commonly known as:  TOPROL-XL Take 3 tablets (75 mg total) by mouth daily. Take with or immediately following a meal.   nitroGLYCERIN 0.4 MG SL tablet Commonly known as:  NITROSTAT Place 1 tablet (0.4 mg total) under the tongue every 5 (five) minutes as needed for chest pain.   oxyCODONE 15 MG immediate release tablet Commonly known as:  ROXICODONE Take 30 mg by mouth every 8 (eight) hours as needed for pain.   pantoprazole 40 MG tablet Commonly known as:  PROTONIX Take 40 mg by mouth daily.   potassium chloride 8 MEQ tablet Commonly known  as:  KLOR-CON Take 1 tablet (8 mEq total) by mouth daily.   prednisoLONE acetate 1 % ophthalmic suspension Commonly known as:  PRED FORTE Place 1 drop into the left eye 4 (four) times daily.   pregabalin 50 MG capsule Commonly known as:  LYRICA Take 50 mg by mouth daily.   rosuvastatin 10 MG tablet Commonly known as:  CRESTOR Take 10 mg by mouth daily.   silodosin 8 MG Caps capsule Commonly known as:  RAPAFLO Take 8 mg by mouth daily with breakfast.   timolol 0.5 % ophthalmic solution Commonly known as:  BETIMOL Place 1 drop into the left eye daily.      No Known Allergies    The results of significant diagnostics from this hospitalization (including imaging, microbiology, ancillary and laboratory) are listed below for reference.    Significant Diagnostic Studies: Dg Chest 2 View  Result Date: 07/24/2016 CLINICAL DATA:  Shortness of breath and leg edema. EXAM: CHEST  2 VIEW COMPARISON:  Chest CT 06/10/2016 FINDINGS: Postsurgical changes from CABG. The cardiac silhouette is enlarged. Calcific atherosclerotic disease of the aorta. Diffuse increase of the interstitial markings. Left more than right lower lobe peribronchial airspace opacities. Left pleural effusion. Osseous structures are without acute abnormality. Soft tissues are grossly normal. IMPRESSION: Enlarged cardiac silhouette with mild interstitial pulmonary edema. Aortic atherosclerosis. Small left pleural effusion. Bibasilar peribronchial airspace opacities, left greater than right, which may represent atelectasis versus consolidation. Electronically Signed   By: Fidela Salisbury M.D.   On: 07/24/2016 10:56   US Venous Img Lower Bilateral  Result Date: 07/24/2016 CLINICAL DATA:  Bilateral leg wounds EXAM: BILATERAL LOWER EXTREMITY VENOUS DOPPLER ULTRASOUND TECHNIQUE: Gray-scale sonography with graded compression, as well as color Doppler and duplex ultrasound were performed to evaluate the lower extremity deep venous  systems from the level of the common femoral vein and including the common femoral, femoral, profunda femoral, popliteal and calf veins including the posterior tibial, peroneal and gastrocnemius veins when visible. The superficial great saphenous vein was also interrogated. Spectral Doppler was utilized to evaluate flow at rest and with distal augmentation maneuvers in the common femoral, femoral and popliteal veins. COMPARISON:  None. FINDINGS: RIGHT LOWER EXTREMITY Common Femoral Vein: No evidence of thrombus. Normal compressibility, respiratory phasicity and response to augmentation. Saphenofemoral Junction: No evidence of thrombus. Normal compressibility and flow on color Doppler imaging. Profunda Femoral Vein: No evidence of thrombus. Normal compressibility and flow on color Doppler imaging. Femoral Vein: No evidence of thrombus. Normal compressibility, respiratory phasicity and response to augmentation. Popliteal Vein: No evidence of thrombus. Normal compressibility, respiratory phasicity and response to augmentation. Calf Veins: No evidence of thrombus. Normal compressibility and flow on color Doppler imaging. Superficial Great Saphenous Vein: No evidence of thrombus. Normal compressibility and flow on color Doppler imaging. Venous Reflux:  None. Other Findings:  Calf edema is noted. LEFT LOWER EXTREMITY  Common Femoral Vein: No evidence of thrombus. Normal compressibility, respiratory phasicity and response to augmentation. Saphenofemoral Junction: No evidence of thrombus. Normal compressibility and flow on color Doppler imaging. Profunda Femoral Vein: No evidence of thrombus. Normal compressibility and flow on color Doppler imaging. Femoral Vein: No evidence of thrombus. Normal compressibility, respiratory phasicity and response to augmentation. Popliteal Vein: No evidence of thrombus. Normal compressibility, respiratory phasicity and response to augmentation. Calf Veins: No evidence of thrombus. Normal  compressibility and flow on color Doppler imaging. Superficial Great Saphenous Vein: No evidence of thrombus. Normal compressibility and flow on color Doppler imaging. Venous Reflux:  None. Other Findings:  Calf edema is noted. IMPRESSION: No evidence of DVT within either lower extremity. Bilateral calf edema Electronically Signed   By: Inez Catalina M.D.   On: 07/24/2016 15:41    Microbiology: No results found for this or any previous visit (from the past 240 hour(s)).   Labs: Basic Metabolic Panel:  Recent Labs Lab 07/24/16 1005 07/25/16 0528 07/26/16 0714 07/27/16 0815  NA 138 145 139 140  K 4.5 4.0 4.4 4.2  CL 101 105 101 101  CO2 27 31 30 30   GLUCOSE 232* 143* 218* 161*  BUN 24* 25* 33* 42*  CREATININE 1.40* 1.57* 1.96* 2.19*  CALCIUM 9.6 9.5 9.7 9.1  MG  --  2.0  --  2.1   Liver Function Tests:  Recent Labs Lab 07/24/16 1005  AST 16  ALT 16*  ALKPHOS 112  BILITOT 1.3*  PROT 6.9  ALBUMIN 3.9   No results for input(s): LIPASE, AMYLASE in the last 168 hours. No results for input(s): AMMONIA in the last 168 hours. CBC:  Recent Labs Lab 07/24/16 1005  WBC 11.0*  NEUTROABS 9.4*  HGB 11.7*  HCT 36.2*  MCV 87.0  PLT 195   Cardiac Enzymes:  Recent Labs Lab 07/24/16 1005  TROPONINI <0.03   BNP: BNP (last 3 results)  Recent Labs  03/19/16 1222 06/08/16 1133 07/24/16 1006  BNP 156.0* 1,759.0* 251.0*    ProBNP (last 3 results) No results for input(s): PROBNP in the last 8760 hours.  CBG:  Recent Labs Lab 07/26/16 1127 07/26/16 1623 07/26/16 2131 07/27/16 0747 07/27/16 1140  GLUCAP 168* 132* 160* 166* 155*       Signed:  Vallory Oetken M  [facility] Pager: [phone] 07/27/2016, 1:22 PM

## 2016-07-27 NOTE — Progress Notes (Signed)
PT Cancellation Note  Patient Details Name: [name] MRN: [id number] DOB: 05/26/1931   Cancelled Treatment:    Reason Eval/Treat Not Completed: PT screened, no needs identified, will sign off.  Chart reviewed, RN consulted. Pt was evaluated by PT ~6WA and recommended for HHPT. Chart review shows pt AMB 219ft 1DA with RN, O2 sats on room air WNL. Patient interviewed at bedside reports that he noted no acute weakness or deconditioning. He reports baseline AMB limitations from chronic low back pain Duncan discopathy at 4 levels, and chronic vertigo. Pt is back to baseline, no skilled PT eval needed at this time. PT signing off.   11:53 AM, 07/27/16 Etta Grandchild, PT, DPT Physical Therapist - [place] [phone] (Office)    [name],[name] 07/27/2016, 11:49 AM

## 2016-08-07 ENCOUNTER — Encounter: Payer: Self-pay | Admitting: Gastroenterology

## 2016-08-07 ENCOUNTER — Ambulatory Visit: Payer: Medicare Other | Admitting: Gastroenterology

## 2016-08-10 ENCOUNTER — Ambulatory Visit: Payer: Medicare Other | Admitting: Adult Health

## 2016-08-10 ENCOUNTER — Encounter: Payer: Self-pay | Admitting: Adult Health

## 2016-08-10 NOTE — Progress Notes (Deleted)
Cardiology Office Note   Date:  08/10/2016   ID:  [name], DOB 09-May-1931, MRN [id number]  PCP:  [name], MD  Cardiologist:  No chief complaint on file.     History of Present Illness: [name] is a 83 y.o. male who presents for    post hospitals age and follow-up after admission for D compensated CHF, known history of chronic diastolic dysfunction, coronary artery disease with CABG 3, atrial fibrillation on ELIQUIS, hypertension, hyperlipidemia, insulin-dependent diabetes, some illiteracy leading to difficulty in following medication directions and regimen. Past Medical History:  Diagnosis Date  . BPH (benign prostatic hyperplasia)   . CAD (coronary artery disease)    Multivessel status post CABG 2011 - LIMA to LAD, SVG to diagonal, SVG to OM, SVG to PDA  . Cellulitis    12/15  . Chronic back pain   . Chronic diastolic CHF (congestive heart failure) ([place])   . CKD (chronic kidney disease), stage III   . COPD (chronic obstructive pulmonary disease) ([place])   . Essential hypertension   . Gastric mass    EGD 9/15  . GERD (gastroesophageal reflux disease)   . History of DVT (deep vein thrombosis)    Postphlebitic syndrome  . History of kidney stones   . HOH (hard of hearing)   . Hx of CABG   . Hyperlipidemia   . Persistent atrial fibrillation ([place])    a. s/p DCCV 03/2016.  [name] Sleep apnea    Stop Bang score of 5  . Type 2 diabetes mellitus ([place])     Past Surgical History:  Procedure Laterality Date  . CARDIOVERSION N/A 03/20/2016   Procedure: CARDIOVERSION;  Surgeon: Arnoldo Lenis, MD;  Location: [facility];  Service: Endoscopy;  Laterality: N/A;  . CATARACT EXTRACTION W/PHACO Left 02/07/2016   Procedure: CATARACT EXTRACTION PHACO AND INTRAOCULAR LENS PLACEMENT (IOC);  Surgeon: [name], MD;  Location: [facility] ORS;  Service: Ophthalmology;  Laterality: Left;  CDE:  23.13  . COLONOSCOPY  2004   Dr. Laural Golden: three small polyps at cecum, path unknown,  external hemorrhoids  . COLONOSCOPY N/A 08/16/2013   Dr. [name]: incomplete prep. multiple tubular adenomas, multiple biopsies. Needs surveillance in Aug 2016 due to poor prep  . CORONARY ARTERY BYPASS GRAFT     x5  . CORONARY ARTERY BYPASS GRAFT  2011  . CYSTOSCOPY N/A 12/12/2012   Procedure: CYSTOSCOPY FLEXIBLE;  Surgeon: Marissa Nestle, MD;  Location: [facility] OR[facility];  Service: Urology;  Laterality: N/A;  . ESOPHAGOGASTRODUODENOSCOPY N/A 08/16/2013   Dr. [name]: normal esophagus s/p Maloney dilation, gastric erosions, submucosal gastric mass vs extrinsic mass  . EUS N/A 09/07/2013   Dr. 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 INCISION AND DRAINAGE ABSCESS N/A 12/20/2013   Procedure: INCISION AND DRAINAGE ABSCESS NECK;  Surgeon: Jamesetta So, MD;  Location: [facility] OR[facility];  Service: General;  Laterality: N/A;  . Venia Minks DILATION N/A 08/16/2013   Procedure: Keturah Shavers;  Surgeon: Daneil Dolin, MD;  Location: [facility];  Service: Endoscopy;  Laterality: N/A;     Current Outpatient Prescriptions  Medication Sig Dispense Refill  . amLODipine-olmesartan (AZOR) 5-40 MG tablet Take 1 tablet by mouth daily.    [name] apixaban (ELIQUIS) 5 MG TABS tablet Take 5 mg by mouth 2 (two) times daily.    . cholecalciferol (VITAMIN D) 1000 units tablet Take 5,000 Units by mouth daily.    . cloNIDine (CATAPRES) 0.1 MG tablet  Take 0.1 mg by mouth 2 (two) times daily.    [name] docusate sodium (COLACE) 100 MG capsule Take 100 mg by mouth 2 (two) times daily.    . furosemide (LASIX) 20 MG tablet TAKE 20 MG IN THE AM AND ALTERNATE 20 MG IN THE EVENING 180 tablet 3  . hydrocortisone cream 1 % Apply 1 application topically 2 (two) times daily.    . insulin glargine (LANTUS) 100 UNIT/ML injection Inject 0.5 mLs (50 Units total) into the skin at bedtime. 10 mL 11  . ipratropium-albuterol (DUONEB) 0.5-2.5 (3) MG/3ML SOLN Take 3 mLs by nebulization every 6 (six) hours as needed (wheezing).    . meclizine  (ANTIVERT) 12.5 MG tablet Take 12.5 mg by mouth 2 (two) times daily.    . metoprolol succinate (TOPROL-XL) 25 MG 24 hr tablet Take 3 tablets (75 mg total) by mouth daily. Take with or immediately following a meal. 90 tablet 4  . nitroGLYCERIN (NITROSTAT) 0.4 MG SL tablet Place 1 tablet (0.4 mg total) under the tongue every 5 (five) minutes as needed for chest pain. 30 tablet 12  . oxyCODONE (ROXICODONE) 15 MG immediate release tablet Take 30 mg by mouth every 8 (eight) hours as needed for pain.     . pantoprazole (PROTONIX) 40 MG tablet Take 40 mg by mouth daily.    . potassium chloride (KLOR-CON) 8 MEQ tablet Take 1 tablet (8 mEq total) by mouth daily. 30 tablet 2  . prednisoLONE acetate (PRED FORTE) 1 % ophthalmic suspension Place 1 drop into the left eye 4 (four) times daily.    . pregabalin (LYRICA) 50 MG capsule Take 50 mg by mouth daily.    . rosuvastatin (CRESTOR) 10 MG tablet Take 10 mg by mouth daily.    . silodosin (RAPAFLO) 8 MG CAPS capsule Take 8 mg by mouth daily with breakfast.    . timolol (BETIMOL) 0.5 % ophthalmic solution Place 1 drop into the left eye daily.     No current facility-administered medications for this visit.     Allergies:   Patient has no known allergies.    Social History:  The patient  reports that he quit smoking about 60 years ago. He has a 11.00 pack-year smoking history. He has never used smokeless tobacco. He reports that he does not drink alcohol or use drugs.   Family History:  The patient's family history includes Colon cancer (age of onset: 56) in his son.    ROS: All other systems are reviewed and negative. Unless otherwise mentioned in H&P    PHYSICAL EXAM: VS:  There were no vitals taken for this visit. , BMI There is no height or weight on file to calculate BMI. GEN: Well nourished, well developed, in no acute distress HEENT: normal Neck: no JVD, carotid bruits, or masses Cardiac: ***RRR; no murmurs, rubs, or gallops,no edema    Respiratory:  clear to auscultation bilaterally, normal work of breathing GI: soft, nontender, nondistended, + BS MS: no deformity or atrophy Skin: warm and dry, no rash Neuro:  Strength and sensation are intact Psych: euthymic mood, full affect   EKG:  EKG {ACTION; IS/IS IDP:[id number]} ordered today. The ekg ordered today demonstrates ***   Recent Labs: 03/19/2016: TSH 1.288 07/24/2016: ALT 16; B Natriuretic Peptide 251.0; Hemoglobin 11.7; Platelets 195 07/27/2016: BUN 42; Creatinine, Ser 2.19; Magnesium 2.1; Potassium 4.2; Sodium 140    Lipid Panel    Component Value Date/Time   CHOL 154 03/19/2016 1222   TRIG 136  03/19/2016 1222   HDL 32 (L) 03/19/2016 1222   CHOLHDL 4.8 03/19/2016 1222   VLDL 27 03/19/2016 1222   LDLCALC 95 03/19/2016 1222      Wt Readings from Last 3 Encounters:  07/27/16 215 lb 6.2 oz (97.7 kg)  06/29/16 215 lb (97.5 kg)  06/15/16 222 lb (100.7 kg)      Other studies Reviewed: Additional studies/ records that were reviewed today include: ***. Review of the above records demonstrates: ***   ASSESSMENT AND PLAN:  1.  ***   Current medicines are reviewed at length with the patient today.    Labs/ tests ordered today include: *** Phill Myron. [place] Pugh, ANP, AACC   08/10/2016 7:22 AM    [facility]. [address], [place], [place] [address] Phone: [phone]; Fax: [phone]

## 2016-08-14 NOTE — Discharge Summary (Signed)
Physician Discharge Summary  [name] OZH:[id number] DOB: June 26, 1931 DOA: 07/24/2016  PCP: [name], MD  Admit date: 07/24/2016 Discharge date: 08/14/2016   Recommendations for Outpatient Follow-up:  The patient is advised to use his DuoNeb nebulizer 4 times a day at home to take prednisone 20 mg by mouth daily for a period of 10 days to follow-up my office within 4-5 days' time to assess all of his medicines and bottles and organize to continue ambulating with 2 L nasal O2 and likewise to use nasal O2 and see Pap at bedtime nightly Discharge Diagnoses:  Active Problems:   Obesity (BMI 30-39.9): BMI 31.3   Acute on chronic diastolic CHF (congestive heart failure) (HCC)   Persistent atrial fibrillation ([place])   Type 2 diabetes mellitus with hyperglycemia, with long-term current use of insulin [facility])   Discharge Condition: Good  Filed Weights   07/26/16 0519 07/26/16 1547 07/27/16 0656  Weight: 98.8 kg (217 lb 13 oz) 97.9 kg (215 lb 12.8 oz) 97.7 kg (215 lb 6.2 oz)    History of present illness:  The patient is an 83 year old white male with history of COPD coronary artery disease status post CABG 3 hypertension hyperlipidemia insulin minute diabetes been complaining of increasing dyspnea he likewise has GERD and found to have a hypoxic respiratory insufficiency which was multifactorial due to all the above issues 2-D echo revealed near normal systolic function with diastolic dysfunction he was given dual antibiotics in form of Rocephin and Zithromax for any possible bronchitis a lymph node definite infiltrate on chest x-ray likewise was given intravenous Solu-Medrol 125 IV every 8 hours he will respond to this over. Of 6-7 days was seen and evaluated for his acid reflux which was thought to be possibly precipitating some dyspnea at night patient continued to have difficulty swallowing swallowing study was performed results of which I do not recall the time of this discharge  however this will be followed up with as an outpatient he was likewise added to have proton pump inhibitor which was part of his prehospital admission medicines  Hospital Course:  See history of present illness above  Procedures:     Consultations:  Gastroenterology and pulmonology  Discharge Instructions  Discharge Instructions    Discharge instructions    Complete by:  As directed    Discharge patient    Complete by:  As directed    Discharge disposition:  01-Home or Self Care   Discharge patient date:  07/27/2016     Allergies as of 07/27/2016   No Known Allergies     Medication List    TAKE these medications   AZOR 5-40 MG tablet Generic drug:  amLODipine-olmesartan Take 1 tablet by mouth daily.   cholecalciferol 1000 units tablet Commonly known as:  VITAMIN D Take 5,000 Units by mouth daily.   cloNIDine 0.1 MG tablet Commonly known as:  CATAPRES Take 0.1 mg by mouth 2 (two) times daily.   docusate sodium 100 MG capsule Commonly known as:  COLACE Take 100 mg by mouth 2 (two) times daily.   ELIQUIS 5 MG Tabs tablet Generic drug:  apixaban Take 5 mg by mouth 2 (two) times daily.   furosemide 20 MG tablet Commonly known as:  LASIX TAKE 20 MG IN THE AM AND ALTERNATE 20 MG IN THE EVENING   hydrocortisone cream 1 % Apply 1 application topically 2 (two) times daily.   insulin glargine 100 UNIT/ML injection Commonly known as:  LANTUS Inject 0.5  mLs (50 Units total) into the skin at bedtime.   ipratropium-albuterol 0.5-2.5 (3) MG/3ML Soln Commonly known as:  DUONEB Take 3 mLs by nebulization every 6 (six) hours as needed (wheezing).   meclizine 12.5 MG tablet Commonly known as:  ANTIVERT Take 12.5 mg by mouth 2 (two) times daily.   metoprolol succinate 25 MG 24 hr tablet Commonly known as:  TOPROL-XL Take 3 tablets (75 mg total) by mouth daily. Take with or immediately following a meal.   nitroGLYCERIN 0.4 MG SL tablet Commonly known as:   NITROSTAT Place 1 tablet (0.4 mg total) under the tongue every 5 (five) minutes as needed for chest pain.   oxyCODONE 15 MG immediate release tablet Commonly known as:  ROXICODONE Take 30 mg by mouth every 8 (eight) hours as needed for pain.   pantoprazole 40 MG tablet Commonly known as:  PROTONIX Take 40 mg by mouth daily.   potassium chloride 8 MEQ tablet Commonly known as:  KLOR-CON Take 1 tablet (8 mEq total) by mouth daily.   prednisoLONE acetate 1 % ophthalmic suspension Commonly known as:  PRED FORTE Place 1 drop into the left eye 4 (four) times daily.   pregabalin 50 MG capsule Commonly known as:  LYRICA Take 50 mg by mouth daily.   rosuvastatin 10 MG tablet Commonly known as:  CRESTOR Take 10 mg by mouth daily.   silodosin 8 MG Caps capsule Commonly known as:  RAPAFLO Take 8 mg by mouth daily with breakfast.   timolol 0.5 % ophthalmic solution Commonly known as:  BETIMOL Place 1 drop into the left eye daily.      No Known Allergies    The results of significant diagnostics from this hospitalization (including imaging, microbiology, ancillary and laboratory) are listed below for reference.    Significant Diagnostic Studies: Dg Chest 2 View  Result Date: 07/24/2016 CLINICAL DATA:  Shortness of breath and leg edema. EXAM: CHEST  2 VIEW COMPARISON:  Chest CT 06/10/2016 FINDINGS: Postsurgical changes from CABG. The cardiac silhouette is enlarged. Calcific atherosclerotic disease of the aorta. Diffuse increase of the interstitial markings. Left more than right lower lobe peribronchial airspace opacities. Left pleural effusion. Osseous structures are without acute abnormality. Soft tissues are grossly normal. IMPRESSION: Enlarged cardiac silhouette with mild interstitial pulmonary edema. Aortic atherosclerosis. Small left pleural effusion. Bibasilar peribronchial airspace opacities, left greater than right, which may represent atelectasis versus consolidation.  Electronically Signed   By: Fidela Salisbury M.D.   On: 07/24/2016 10:56   US Venous Img Lower Bilateral  Result Date: 07/24/2016 CLINICAL DATA:  Bilateral leg wounds EXAM: BILATERAL LOWER EXTREMITY VENOUS DOPPLER ULTRASOUND TECHNIQUE: Gray-scale sonography with graded compression, as well as color Doppler and duplex ultrasound were performed to evaluate the lower extremity deep venous systems from the level of the common femoral vein and including the common femoral, femoral, profunda femoral, popliteal and calf veins including the posterior tibial, peroneal and gastrocnemius veins when visible. The superficial great saphenous vein was also interrogated. Spectral Doppler was utilized to evaluate flow at rest and with distal augmentation maneuvers in the common femoral, femoral and popliteal veins. COMPARISON:  None. FINDINGS: RIGHT LOWER EXTREMITY Common Femoral Vein: No evidence of thrombus. Normal compressibility, respiratory phasicity and response to augmentation. Saphenofemoral Junction: No evidence of thrombus. Normal compressibility and flow on color Doppler imaging. Profunda Femoral Vein: No evidence of thrombus. Normal compressibility and flow on color Doppler imaging. Femoral Vein: No evidence of thrombus. Normal compressibility, respiratory phasicity and response  to augmentation. Popliteal Vein: No evidence of thrombus. Normal compressibility, respiratory phasicity and response to augmentation. Calf Veins: No evidence of thrombus. Normal compressibility and flow on color Doppler imaging. Superficial Great Saphenous Vein: No evidence of thrombus. Normal compressibility and flow on color Doppler imaging. Venous Reflux:  None. Other Findings:  Calf edema is noted. LEFT LOWER EXTREMITY Common Femoral Vein: No evidence of thrombus. Normal compressibility, respiratory phasicity and response to augmentation. Saphenofemoral Junction: No evidence of thrombus. Normal compressibility and flow on color Doppler  imaging. Profunda Femoral Vein: No evidence of thrombus. Normal compressibility and flow on color Doppler imaging. Femoral Vein: No evidence of thrombus. Normal compressibility, respiratory phasicity and response to augmentation. Popliteal Vein: No evidence of thrombus. Normal compressibility, respiratory phasicity and response to augmentation. Calf Veins: No evidence of thrombus. Normal compressibility and flow on color Doppler imaging. Superficial Great Saphenous Vein: No evidence of thrombus. Normal compressibility and flow on color Doppler imaging. Venous Reflux:  None. Other Findings:  Calf edema is noted. IMPRESSION: No evidence of DVT within either lower extremity. Bilateral calf edema Electronically Signed   By: Inez Catalina M.D.   On: 07/24/2016 15:41    Microbiology: No results found for this or any previous visit (from the past 240 hour(s)).   Labs: Basic Metabolic Panel: No results for input(s): NA, K, CL, CO2, GLUCOSE, BUN, CREATININE, CALCIUM, MG, PHOS in the last 168 hours. Liver Function Tests: No results for input(s): AST, ALT, ALKPHOS, BILITOT, PROT, ALBUMIN in the last 168 hours. No results for input(s): LIPASE, AMYLASE in the last 168 hours. No results for input(s): AMMONIA in the last 168 hours. CBC: No results for input(s): WBC, NEUTROABS, HGB, HCT, MCV, PLT in the last 168 hours. Cardiac Enzymes: No results for input(s): CKTOTAL, CKMB, CKMBINDEX, TROPONINI in the last 168 hours. BNP: BNP (last 3 results)  Recent Labs  03/19/16 1222 06/08/16 1133 07/24/16 1006  BNP 156.0* 1,759.0* 251.0*    ProBNP (last 3 results) No results for input(s): PROBNP in the last 8760 hours.  CBG: No results for input(s): GLUCAP in the last 168 hours.     Signed:  Telecia Larocque Jerilynn Mages  [facility] Pager: [phone] 08/14/2016, 1:16 PM

## 2016-08-20 ENCOUNTER — Inpatient Hospital Stay (HOSPITAL_COMMUNITY): Payer: Medicare Other

## 2016-08-20 ENCOUNTER — Inpatient Hospital Stay (HOSPITAL_COMMUNITY)
Admission: EM | Admit: 2016-08-20 | Discharge: 2016-08-27 | DRG: 602 | Disposition: A | Discharge status: Home Health | Payer: Medicare Other | Attending: Family Medicine | Admitting: Family Medicine

## 2016-08-20 ENCOUNTER — Emergency Department (HOSPITAL_COMMUNITY): Payer: Medicare Other

## 2016-08-20 ENCOUNTER — Encounter (HOSPITAL_COMMUNITY): Payer: Self-pay | Admitting: Emergency Medicine

## 2016-08-20 DIAGNOSIS — G4730 Sleep apnea, unspecified: Secondary | ICD-10-CM | POA: Diagnosis present

## 2016-08-20 DIAGNOSIS — L03119 Cellulitis of unspecified part of limb: Secondary | ICD-10-CM | POA: Diagnosis not present

## 2016-08-20 DIAGNOSIS — L03818 Cellulitis of other sites: Secondary | ICD-10-CM

## 2016-08-20 DIAGNOSIS — R1319 Other dysphagia: Secondary | ICD-10-CM

## 2016-08-20 DIAGNOSIS — I959 Hypotension, unspecified: Secondary | ICD-10-CM | POA: Diagnosis present

## 2016-08-20 DIAGNOSIS — N179 Acute kidney failure, unspecified: Secondary | ICD-10-CM

## 2016-08-20 DIAGNOSIS — E0800 Diabetes mellitus due to underlying condition with hyperosmolarity without nonketotic hyperglycemic-hyperosmolar coma (NKHHC): Secondary | ICD-10-CM

## 2016-08-20 DIAGNOSIS — M549 Dorsalgia, unspecified: Secondary | ICD-10-CM | POA: Diagnosis present

## 2016-08-20 DIAGNOSIS — E785 Hyperlipidemia, unspecified: Secondary | ICD-10-CM | POA: Diagnosis present

## 2016-08-20 DIAGNOSIS — D649 Anemia, unspecified: Secondary | ICD-10-CM | POA: Diagnosis present

## 2016-08-20 DIAGNOSIS — Z79899 Other long term (current) drug therapy: Secondary | ICD-10-CM

## 2016-08-20 DIAGNOSIS — N400 Enlarged prostate without lower urinary tract symptoms: Secondary | ICD-10-CM | POA: Diagnosis present

## 2016-08-20 DIAGNOSIS — Z86010 Personal history of colonic polyps: Secondary | ICD-10-CM

## 2016-08-20 DIAGNOSIS — Z794 Long term (current) use of insulin: Secondary | ICD-10-CM

## 2016-08-20 DIAGNOSIS — E1151 Type 2 diabetes mellitus with diabetic peripheral angiopathy without gangrene: Secondary | ICD-10-CM | POA: Diagnosis present

## 2016-08-20 DIAGNOSIS — N170 Acute kidney failure with tubular necrosis: Secondary | ICD-10-CM | POA: Diagnosis present

## 2016-08-20 DIAGNOSIS — R0600 Dyspnea, unspecified: Secondary | ICD-10-CM

## 2016-08-20 DIAGNOSIS — H9190 Unspecified hearing loss, unspecified ear: Secondary | ICD-10-CM | POA: Diagnosis present

## 2016-08-20 DIAGNOSIS — E119 Type 2 diabetes mellitus without complications: Secondary | ICD-10-CM | POA: Diagnosis not present

## 2016-08-20 DIAGNOSIS — Z7901 Long term (current) use of anticoagulants: Secondary | ICD-10-CM

## 2016-08-20 DIAGNOSIS — J449 Chronic obstructive pulmonary disease, unspecified: Secondary | ICD-10-CM | POA: Diagnosis present

## 2016-08-20 DIAGNOSIS — L0390 Cellulitis, unspecified: Secondary | ICD-10-CM | POA: Diagnosis present

## 2016-08-20 DIAGNOSIS — S91332A Puncture wound without foreign body, left foot, initial encounter: Secondary | ICD-10-CM

## 2016-08-20 DIAGNOSIS — R630 Anorexia: Secondary | ICD-10-CM | POA: Diagnosis present

## 2016-08-20 DIAGNOSIS — N189 Chronic kidney disease, unspecified: Secondary | ICD-10-CM

## 2016-08-20 DIAGNOSIS — L03116 Cellulitis of left lower limb: Secondary | ICD-10-CM | POA: Diagnosis present

## 2016-08-20 DIAGNOSIS — I2510 Atherosclerotic heart disease of native coronary artery without angina pectoris: Secondary | ICD-10-CM | POA: Diagnosis present

## 2016-08-20 DIAGNOSIS — R809 Proteinuria, unspecified: Secondary | ICD-10-CM | POA: Diagnosis present

## 2016-08-20 DIAGNOSIS — G8929 Other chronic pain: Secondary | ICD-10-CM | POA: Diagnosis present

## 2016-08-20 DIAGNOSIS — K219 Gastro-esophageal reflux disease without esophagitis: Secondary | ICD-10-CM | POA: Diagnosis present

## 2016-08-20 DIAGNOSIS — I5032 Chronic diastolic (congestive) heart failure: Secondary | ICD-10-CM | POA: Diagnosis present

## 2016-08-20 DIAGNOSIS — N183 Chronic kidney disease, stage 3 (moderate): Secondary | ICD-10-CM | POA: Diagnosis present

## 2016-08-20 DIAGNOSIS — Z23 Encounter for immunization: Secondary | ICD-10-CM

## 2016-08-20 DIAGNOSIS — E1140 Type 2 diabetes mellitus with diabetic neuropathy, unspecified: Secondary | ICD-10-CM | POA: Diagnosis present

## 2016-08-20 DIAGNOSIS — S91342A Puncture wound with foreign body, left foot, initial encounter: Secondary | ICD-10-CM | POA: Diagnosis present

## 2016-08-20 DIAGNOSIS — R1310 Dysphagia, unspecified: Secondary | ICD-10-CM

## 2016-08-20 DIAGNOSIS — Z951 Presence of aortocoronary bypass graft: Secondary | ICD-10-CM

## 2016-08-20 DIAGNOSIS — I130 Hypertensive heart and chronic kidney disease with heart failure and stage 1 through stage 4 chronic kidney disease, or unspecified chronic kidney disease: Secondary | ICD-10-CM | POA: Diagnosis present

## 2016-08-20 DIAGNOSIS — I4819 Other persistent atrial fibrillation: Secondary | ICD-10-CM

## 2016-08-20 DIAGNOSIS — E1122 Type 2 diabetes mellitus with diabetic chronic kidney disease: Secondary | ICD-10-CM | POA: Diagnosis present

## 2016-08-20 DIAGNOSIS — I10 Essential (primary) hypertension: Secondary | ICD-10-CM

## 2016-08-20 DIAGNOSIS — L03031 Cellulitis of right toe: Secondary | ICD-10-CM

## 2016-08-20 DIAGNOSIS — Z86718 Personal history of other venous thrombosis and embolism: Secondary | ICD-10-CM

## 2016-08-20 DIAGNOSIS — M5440 Lumbago with sciatica, unspecified side: Secondary | ICD-10-CM

## 2016-08-20 DIAGNOSIS — I5033 Acute on chronic diastolic (congestive) heart failure: Secondary | ICD-10-CM

## 2016-08-20 DIAGNOSIS — Z87891 Personal history of nicotine dependence: Secondary | ICD-10-CM

## 2016-08-20 DIAGNOSIS — Z79891 Long term (current) use of opiate analgesic: Secondary | ICD-10-CM

## 2016-08-20 LAB — BASIC METABOLIC PANEL WITH GFR
Anion gap: 10 (ref 5–15)
BUN: 43 mg/dL — ABNORMAL HIGH (ref 6–20)
CO2: 26 mmol/L (ref 22–32)
Calcium: 9.9 mg/dL (ref 8.9–10.3)
Chloride: 102 mmol/L (ref 101–111)
Creatinine, Ser: 1.9 mg/dL — ABNORMAL HIGH (ref 0.61–1.24)
GFR calc Af Amer: 36 mL/min — ABNORMAL LOW
GFR calc non Af Amer: 31 mL/min — ABNORMAL LOW
Glucose, Bld: 105 mg/dL — ABNORMAL HIGH (ref 65–99)
Potassium: 4.2 mmol/L (ref 3.5–5.1)
Sodium: 138 mmol/L (ref 135–145)

## 2016-08-20 LAB — CBC WITH DIFFERENTIAL/PLATELET
Basophils Absolute: 0 K/uL (ref 0.0–0.1)
Basophils Relative: 0 %
Eosinophils Absolute: 0.1 K/uL (ref 0.0–0.7)
Eosinophils Relative: 0 %
HCT: 41.1 % (ref 39.0–52.0)
Hemoglobin: 13.1 g/dL (ref 13.0–17.0)
Lymphocytes Relative: 4 %
Lymphs Abs: 0.9 K/uL (ref 0.7–4.0)
MCH: 27.6 pg (ref 26.0–34.0)
MCHC: 31.9 g/dL (ref 30.0–36.0)
MCV: 86.7 fL (ref 78.0–100.0)
Monocytes Absolute: 1 K/uL (ref 0.1–1.0)
Monocytes Relative: 5 %
Neutro Abs: 21 K/uL — ABNORMAL HIGH (ref 1.7–7.7)
Neutrophils Relative %: 91 %
Platelets: 215 K/uL (ref 150–400)
RBC: 4.74 MIL/uL (ref 4.22–5.81)
RDW: 14.3 % (ref 11.5–15.5)
WBC: 23 K/uL — ABNORMAL HIGH (ref 4.0–10.5)

## 2016-08-20 LAB — HEMOGLOBIN A1C
Hgb A1c MFr Bld: 9.8 % — ABNORMAL HIGH (ref 4.8–5.6)
Mean Plasma Glucose: 234.56 mg/dL

## 2016-08-20 LAB — I-STAT CG4 LACTIC ACID, ED: Lactic Acid, Venous: 1.44 mmol/L (ref 0.5–1.9)

## 2016-08-20 LAB — CBG MONITORING, ED: GLUCOSE-CAPILLARY: 66 mg/dL (ref 65–99)

## 2016-08-20 MED ORDER — IPRATROPIUM-ALBUTEROL 0.5-2.5 (3) MG/3ML IN SOLN
3.0000 mL | Freq: Four times a day (QID) | RESPIRATORY_TRACT | Status: DC | PRN
  Administered 2016-08-24: 3 mL via RESPIRATORY_TRACT
  Filled 2016-08-20: qty 3

## 2016-08-20 MED ORDER — FUROSEMIDE 40 MG PO TABS
40.0000 mg | ORAL_TABLET | Freq: Two times a day (BID) | ORAL | Status: DC
  Administered 2016-08-21 – 2016-08-23 (×5): 40 mg via ORAL
  Filled 2016-08-20 (×5): qty 1

## 2016-08-20 MED ORDER — PIPERACILLIN-TAZOBACTAM 3.375 G IVPB 30 MIN
3.3750 g | Freq: Once | INTRAVENOUS | Status: AC
  Administered 2016-08-20: 3.375 g via INTRAVENOUS
  Filled 2016-08-20: qty 50

## 2016-08-20 MED ORDER — METOPROLOL SUCCINATE ER 25 MG PO TB24
75.0000 mg | ORAL_TABLET | Freq: Every day | ORAL | Status: DC
  Administered 2016-08-21 – 2016-08-27 (×6): 75 mg via ORAL
  Filled 2016-08-20 (×7): qty 1

## 2016-08-20 MED ORDER — TIMOLOL MALEATE 0.5 % OP SOLN
1.0000 [drp] | Freq: Every day | OPHTHALMIC | Status: DC
  Administered 2016-08-20 – 2016-08-27 (×8): 1 [drp] via OPHTHALMIC
  Filled 2016-08-20: qty 5

## 2016-08-20 MED ORDER — IRBESARTAN 300 MG PO TABS
300.0000 mg | ORAL_TABLET | Freq: Every day | ORAL | Status: DC
  Administered 2016-08-21 – 2016-08-27 (×7): 300 mg via ORAL
  Filled 2016-08-20 (×7): qty 1

## 2016-08-20 MED ORDER — TETANUS-DIPHTH-ACELL PERTUSSIS 5-2.5-18.5 LF-MCG/0.5 IM SUSP
0.5000 mL | Freq: Once | INTRAMUSCULAR | Status: AC
  Administered 2016-08-20: 0.5 mL via INTRAMUSCULAR
  Filled 2016-08-20: qty 0.5

## 2016-08-20 MED ORDER — VITAMIN D 1000 UNITS PO TABS
5000.0000 [IU] | ORAL_TABLET | Freq: Every day | ORAL | Status: DC
  Administered 2016-08-21 – 2016-08-27 (×7): 5000 [IU] via ORAL
  Filled 2016-08-20 (×7): qty 5

## 2016-08-20 MED ORDER — APIXABAN 5 MG PO TABS
5.0000 mg | ORAL_TABLET | Freq: Two times a day (BID) | ORAL | Status: DC
  Administered 2016-08-20 – 2016-08-25 (×11): 5 mg via ORAL
  Filled 2016-08-20 (×12): qty 1

## 2016-08-20 MED ORDER — ONDANSETRON HCL 4 MG/2ML IJ SOLN
4.0000 mg | Freq: Four times a day (QID) | INTRAMUSCULAR | Status: DC | PRN
  Administered 2016-08-21: 4 mg via INTRAVENOUS
  Filled 2016-08-20: qty 2

## 2016-08-20 MED ORDER — OXYCODONE HCL 5 MG PO TABS
30.0000 mg | ORAL_TABLET | Freq: Three times a day (TID) | ORAL | Status: DC | PRN
  Administered 2016-08-20 (×2): 30 mg via ORAL
  Filled 2016-08-20 (×2): qty 6

## 2016-08-20 MED ORDER — AMLODIPINE-OLMESARTAN 5-40 MG PO TABS: 1.0000 | ORAL_TABLET | Freq: Every day | ORAL | Status: DC

## 2016-08-20 MED ORDER — PANTOPRAZOLE SODIUM 40 MG PO TBEC
40.0000 mg | DELAYED_RELEASE_TABLET | Freq: Every day | ORAL | Status: DC
  Administered 2016-08-21 – 2016-08-27 (×7): 40 mg via ORAL
  Filled 2016-08-20 (×6): qty 1

## 2016-08-20 MED ORDER — PREGABALIN 50 MG PO CAPS
50.0000 mg | ORAL_CAPSULE | Freq: Every day | ORAL | Status: DC
  Administered 2016-08-21 – 2016-08-27 (×8): 50 mg via ORAL
  Filled 2016-08-20 (×7): qty 1

## 2016-08-20 MED ORDER — CLONIDINE HCL 0.1 MG PO TABS
0.1000 mg | ORAL_TABLET | Freq: Two times a day (BID) | ORAL | Status: DC
Start: 2016-08-20 — End: 2016-08-27
  Administered 2016-08-20 – 2016-08-27 (×13): 0.1 mg via ORAL
  Filled 2016-08-20 (×14): qty 1

## 2016-08-20 MED ORDER — AMLODIPINE BESYLATE 5 MG PO TABS
5.0000 mg | ORAL_TABLET | Freq: Every day | ORAL | Status: DC
  Administered 2016-08-21 – 2016-08-27 (×7): 5 mg via ORAL
  Filled 2016-08-20 (×7): qty 1

## 2016-08-20 MED ORDER — NITROGLYCERIN 0.4 MG SL SUBL: 0.4000 mg | SUBLINGUAL_TABLET | SUBLINGUAL | Status: DC | PRN

## 2016-08-20 MED ORDER — DOCUSATE SODIUM 100 MG PO CAPS
100.0000 mg | ORAL_CAPSULE | Freq: Two times a day (BID) | ORAL | Status: DC
  Administered 2016-08-20 – 2016-08-27 (×14): 100 mg via ORAL
  Filled 2016-08-20 (×14): qty 1

## 2016-08-20 MED ORDER — INSULIN GLARGINE 100 UNIT/ML ~~LOC~~ SOLN
50.0000 [IU] | Freq: Every day | SUBCUTANEOUS | Status: DC
  Administered 2016-08-20 – 2016-08-23 (×4): 50 [IU] via SUBCUTANEOUS
  Filled 2016-08-20 (×5): qty 0.5

## 2016-08-20 MED ORDER — INSULIN ASPART 100 UNIT/ML ~~LOC~~ SOLN
0.0000 [IU] | Freq: Three times a day (TID) | SUBCUTANEOUS | Status: DC
  Administered 2016-08-21: 1 [IU] via SUBCUTANEOUS
  Administered 2016-08-22: 2 [IU] via SUBCUTANEOUS
  Administered 2016-08-22: 3 [IU] via SUBCUTANEOUS
  Administered 2016-08-22: 2 [IU] via SUBCUTANEOUS
  Administered 2016-08-23: 1 [IU] via SUBCUTANEOUS
  Administered 2016-08-23: 2 [IU] via SUBCUTANEOUS
  Administered 2016-08-24 (×2): 1 [IU] via SUBCUTANEOUS
  Administered 2016-08-25 – 2016-08-27 (×6): 2 [IU] via SUBCUTANEOUS

## 2016-08-20 MED ORDER — ROSUVASTATIN CALCIUM 10 MG PO TABS
10.0000 mg | ORAL_TABLET | Freq: Every day | ORAL | Status: DC
  Administered 2016-08-21 – 2016-08-26 (×6): 10 mg via ORAL
  Filled 2016-08-20 (×6): qty 1

## 2016-08-20 MED ORDER — SODIUM CHLORIDE 0.9 % IV SOLN
1.0000 g | Freq: Two times a day (BID) | INTRAVENOUS | Status: DC
  Administered 2016-08-20 – 2016-08-23 (×6): 1 g via INTRAVENOUS
  Filled 2016-08-20 (×9): qty 1

## 2016-08-20 MED ORDER — TAMSULOSIN HCL 0.4 MG PO CAPS
0.4000 mg | ORAL_CAPSULE | Freq: Every day | ORAL | Status: DC
  Administered 2016-08-21 – 2016-08-26 (×6): 0.4 mg via ORAL
  Filled 2016-08-20 (×6): qty 1

## 2016-08-20 NOTE — H&P (Signed)
History and Physical    [name] YTK:[id number] DOB: 09/09/1931 DOA: 08/20/2016  Referring MD/NP/PA: Dr.Pickering  PCP: [name], MD  Outpatient Specialists: none   Patient coming from: PCP  Chief Complaint:  Fevers and chills with left foot pain and swelling   HPI: [name] is a 83 y.o. male with medical history significant of dCHF, COPD, CKD 3, HTN, CAD, DM2, HLD, BPH presents from the office of Dr. Lorriane Shire for fevers and chills and low BP.  Yesterday he stepped on a sharp rusted metal piece that then went into his foot. States it went through his shoe. Today started feeling worse with chills pain in the foot. Went to see the doctor. And was found to have chills and a systolic blood pressure in the 90s.    ED Course: WAS found to be stable with initial work up showed [place] of 23k and mildly swollen Left foot , with his Hx of IDDM and Neuropathy , we will admit him to initiate IV abx and do MRI.  Review of Systems: As per HPI otherwise 10 point review of systems negative.    Past Medical History:  Diagnosis Date  . BPH (benign prostatic hyperplasia)   . CAD (coronary artery disease)    Multivessel status post CABG 2011 - LIMA to LAD, SVG to diagonal, SVG to OM, SVG to PDA  . Cellulitis    12/15  . Chronic back pain   . Chronic diastolic CHF (congestive heart failure) ([place])   . CKD (chronic kidney disease), stage III   . COPD (chronic obstructive pulmonary disease) ([place])   . Essential hypertension   . Gastric mass    EGD 9/15  . GERD (gastroesophageal reflux disease)   . History of DVT (deep vein thrombosis)    Postphlebitic syndrome  . History of kidney stones   . HOH (hard of hearing)   . Hx of CABG   . Hyperlipidemia   . Persistent atrial fibrillation ([place])    a. s/p DCCV 03/2016.  [name] Sleep apnea    Stop Bang score of 5  . Type 2 diabetes mellitus ([place])     Past Surgical History:  Procedure Laterality Date  . CARDIOVERSION N/A 03/20/2016   Procedure: CARDIOVERSION;  Surgeon: Arnoldo Lenis, MD;  Location: [facility];  Service: Endoscopy;  Laterality: N/A;  . CATARACT EXTRACTION W/PHACO Left 02/07/2016   Procedure: CATARACT EXTRACTION PHACO AND INTRAOCULAR LENS PLACEMENT (IOC);  Surgeon: [name], MD;  Location: [facility] ORS;  Service: Ophthalmology;  Laterality: Left;  CDE:  23.13  . COLONOSCOPY  2004   Dr. Laural Golden: three small polyps at cecum, path unknown, external hemorrhoids  . COLONOSCOPY N/A 08/16/2013   Dr. [name]: incomplete prep. multiple tubular adenomas, multiple biopsies. Needs surveillance in Aug 2016 due to poor prep  . CORONARY ARTERY BYPASS GRAFT     x5  . CORONARY ARTERY BYPASS GRAFT  2011  . CYSTOSCOPY N/A 12/12/2012   Procedure: CYSTOSCOPY FLEXIBLE;  Surgeon: Marissa Nestle, MD;  Location: [facility] OR[facility];  Service: Urology;  Laterality: N/A;  . ESOPHAGOGASTRODUODENOSCOPY N/A 08/16/2013   Dr. [name]: normal esophagus s/p Maloney dilation, gastric erosions, submucosal gastric mass vs extrinsic mass  . EUS N/A 09/07/2013   Dr. 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 INCISION AND DRAINAGE ABSCESS N/A 12/20/2013   Procedure: INCISION AND DRAINAGE ABSCESS NECK;  Surgeon: Jamesetta So, MD;  Location: [facility];  Service: General;  Laterality: N/A;  Venia Minks DILATION N/A 08/16/2013   Procedure: Venia Minks DILATION;  Surgeon: Daneil Dolin, MD;  Location: [facility];  Service: Endoscopy;  Laterality: N/A;     reports that he quit smoking about 60 years ago. He has a 11.00 pack-year smoking history. He has never used smokeless tobacco. He reports that he does not drink alcohol or use drugs.  No Known Allergies  Family History  Problem Relation Age of Onset  . Colon cancer Son 75       deceased     Prior to Admission medications   Medication Sig Start Date End Date Taking? Authorizing Provider  amLODipine-olmesartan (AZOR) 5-40 MG tablet Take 1 tablet by mouth daily.   Yes [provider]  apixaban (ELIQUIS) 5 MG TABS tablet Take 5 mg by mouth 2 (two) times daily.   Yes [provider]  cholecalciferol (VITAMIN D) 1000 units tablet Take 5,000 Units by mouth daily.   Yes [provider]  cloNIDine (CATAPRES) 0.1 MG tablet Take 0.1 mg by mouth 2 (two) times daily.   Yes [provider]  docusate sodium (COLACE) 100 MG capsule Take 100 mg by mouth 2 (two) times daily.   Yes [provider]  furosemide (LASIX) 20 MG tablet TAKE 20 MG IN THE AM AND ALTERNATE 20 MG IN THE EVENING 07/09/16  Yes Lendon Colonel, NP  insulin glargine (LANTUS) 100 UNIT/ML injection Inject 0.5 mLs (50 Units total) into the skin at bedtime. 06/15/16  Yes Dondiego, Richard, MD  ipratropium-albuterol (DUONEB) 0.5-2.5 (3) MG/3ML SOLN Take 3 mLs by nebulization every 6 (six) hours as needed (wheezing).   Yes [provider]  metoprolol succinate (TOPROL-XL) 25 MG 24 hr tablet Take 3 tablets (75 mg total) by mouth daily. Take with or immediately following a meal. 06/16/16  Yes Dondiego, Richard, MD  nitroGLYCERIN (NITROSTAT) 0.4 MG SL tablet Place 1 tablet (0.4 mg total) under the tongue every 5 (five) minutes as needed for chest pain. 12/22/13  Yes Dondiego, Delfino Lovett, MD  oxyCODONE (ROXICODONE) 15 MG immediate release tablet Take 30 mg by mouth every 8 (eight) hours as needed for pain.    Yes [provider]  pantoprazole (PROTONIX) 40 MG tablet Take 40 mg by mouth daily.   Yes [provider]  pregabalin (LYRICA) 50 MG capsule Take 50 mg by mouth daily.   Yes [provider]  rosuvastatin (CRESTOR) 10 MG tablet Take 10 mg by mouth daily.   Yes [provider]  silodosin (RAPAFLO) 8 MG CAPS capsule Take 8 mg by mouth daily with breakfast.   Yes [provider]  timolol (BETIMOL) 0.5 % ophthalmic solution Place 1 drop into the left eye daily.   Yes [provider]    Physical Exam: Vitals:   08/20/16 1308 08/20/16  1309 08/20/16 1324 08/20/16 1400  BP: 118/68   (!) 113/59  Pulse:  77 82 89  Resp:   16   Temp:      TempSrc:      SpO2:  95% 97% 94%  Weight:      Height:          Constitutional: NAD, calm, comfortable Vitals:   08/20/16 1308 08/20/16 1309 08/20/16 1324 08/20/16 1400  BP: 118/68   (!) 113/59  Pulse:  77 82 89  Resp:   16   Temp:      TempSrc:      SpO2:  95% 97% 94%  Weight:      Height:       Eyes: PERRL, lids and conjunctivae normal ENMT: Mucous membranes are moist. Posterior pharynx clear of any exudate or lesions.Normal dentition.  Neck: normal, supple, no masses, no thyromegaly Respiratory: clear to auscultation bilaterally, no wheezing, no crackles. Normal respiratory effort. No accessory muscle use.  Cardiovascular: Regular rate and rhythm, no murmurs / rubs / gallops. No extremity edema. 2+ pedal pulses. No carotid bruits.  Abdomen: no tenderness, no masses palpated. No hepatosplenomegaly. Bowel sounds positive.  Musculoskeletal: no clubbing / cyanosis. No joint deformity upper and lower extremities. Good ROM, no contractures. Normal muscle tone.  Skin: no rashes, left foot swollen with mild erythema no fluctuation or any fluid collection by palpation . Neurologic: CN 2-12 grossly intact. Sensation intact, DTR normal. Strength 5/5 in all 4.  Psychiatric: Normal judgment and insight. Alert and oriented x 3. Normal mood.   Labs on Admission: I have personally reviewed following labs and imaging studies  CBC:  Recent Labs Lab 08/20/16 1100  WBC 23.0*  NEUTROABS 21.0*  HGB 13.1  HCT 41.1  MCV 86.7  PLT 413   Basic Metabolic Panel:  Recent Labs Lab 08/20/16 1100  NA 138  K 4.2  CL 102  CO2 26  GLUCOSE 105*  BUN 43*  CREATININE 1.90*  CALCIUM 9.9   GFR: Estimated Creatinine Clearance: 35.4 mL/min (A) (by C-G formula based on SCr of 1.9 mg/dL (H)). Liver Function Tests: No results for input(s): AST, ALT, ALKPHOS, BILITOT, PROT, ALBUMIN in the last  168 hours. No results for input(s): LIPASE, AMYLASE in the last 168 hours. No results for input(s): AMMONIA in the last 168 hours. Coagulation Profile: No results for input(s): INR, PROTIME in the last 168 hours. Cardiac Enzymes: No results for input(s): CKTOTAL, CKMB, CKMBINDEX, TROPONINI in the last 168 hours. BNP (last 3 results) No results for input(s): PROBNP in the last 8760 hours. HbA1C: No results for input(s): HGBA1C in the last 72 hours. CBG: No results for input(s): GLUCAP in the last 168 hours. Lipid Profile: No results for input(s): CHOL, HDL, LDLCALC, TRIG, CHOLHDL, LDLDIRECT in the last 72 hours. Thyroid Function Tests: No results for input(s): TSH, T4TOTAL, FREET4, T3FREE, THYROIDAB in the last 72 hours. Anemia Panel: No results for input(s): VITAMINB12, FOLATE, FERRITIN, TIBC, IRON, RETICCTPCT in the last 72 hours. Urine analysis:    Component Value Date/Time   COLORURINE YELLOW 12/19/2013 [place] 12/19/2013 1905   LABSPEC >1.030 (H) 12/19/2013 1905   PHURINE 5.0 12/19/2013 1905   GLUCOSEU NEGATIVE 12/19/2013 1905   HGBUR NEGATIVE 12/19/2013 1905   BILIRUBINUR NEGATIVE 12/19/2013 1905   KETONESUR NEGATIVE 12/19/2013 1905   PROTEINUR 100 (A) 12/19/2013 1905   UROBILINOGEN 0.2 12/19/2013 1905   NITRITE NEGATIVE 12/19/2013 1905   LEUKOCYTESUR NEGATIVE 12/19/2013 1905   Sepsis Labs: @LABRCNTIP (procalcitonin:4,lacticidven:4) )No results found for this or any previous visit (from the past 240 hour(s)).   Radiological Exams on Admission: Dg Chest 2 View  Result Date: 08/20/2016 CLINICAL DATA:  Chills. EXAM: CHEST  2 VIEW COMPARISON:  07/24/2016 FINDINGS: Prior CABG. Cardiomegaly with vascular congestion and diffuse interstitial/alveolar opacities, likely edema/ CHF. No effusions or acute bony abnormality. IMPRESSION: Mild CHF. Electronically Signed   By: Rolm Baptise M.D.   On: 08/20/2016 10:57   Dg Foot Complete Left  Result Date:  08/20/2016 CLINICAL DATA:  Removal of foreign body.  Chills and pain. EXAM: LEFT FOOT - COMPLETE 3+ VIEW COMPARISON:  No recent . FINDINGS: Mild diffuse soft tissue swelling. Peripheral vascular calcification. No radiopaque foreign body. Diffuse degenerative change. No acute bony abnormality identified. No evidence of fracture or dislocation. If osteomyelitis is of concern MRI can be obtained IMPRESSION: 1. Mild diffuse soft tissue swelling. Peripheral vascular disease. No radiopaque foreign body. 2. No acute or focal bony abnormality identified. Diffuse degenerative disease. Electronically Signed   By: Marcello Moores  Register   On: 08/20/2016 10:12    EKG: Independently reviewed.   Assessment/Plan  1-Acute cellulitis induced by rusty nail penetration can not exclude OM . 2-IDDM. 3-Hx of CAD S/P CABG 2011. 4-Hx of CHF . 5-Hx of DVT on AC.   Send for MRI of the foot , cover with Meropenem and Vanc , follow up with cbc and clinically .    DVT prophylaxis: anticoagulated Code Status: Full Family Communication: NONE   Disposition Plan: HOME Consults called: NONE  Admission status:  (inpatient    Waldron Session MD [facility]   If 7PM-7AM, please contact night-coverage [email or web address] Password [email or web address]  08/20/2016, 3:00 PM

## 2016-08-20 NOTE — Progress Notes (Signed)
PHARMACY NOTE:  ANTIMICROBIAL RENAL DOSAGE ADJUSTMENT  Current antimicrobial regimen includes a mismatch between antimicrobial dosage and estimated renal function.  As per policy approved by the Pharmacy & Therapeutics and Medical Executive Committees, the antimicrobial dosage will be adjusted accordingly.  Current antimicrobial dosage:  Meropenem 1gm IV every 8 hours.  Indication: Sepsis  Renal Function:  Estimated Creatinine Clearance: 35.4 mL/min (A) (by C-G formula based on SCr of 1.9 mg/dL (H)). []      On intermittent HD, scheduled: []      On CRRT    Antimicrobial dosage has been changed to:  Meropenem 1gm IV every 12 hours.  Thank you for allowing pharmacy to be a part of this patient's care.  Pricilla Larsson, [facility] 08/20/2016 3:13 PM

## 2016-08-20 NOTE — ED Triage Notes (Signed)
Pt pulled rusty bolt from foot last night. C/o chills and pain.

## 2016-08-20 NOTE — ED Provider Notes (Signed)
[facility] DEPT Provider Note   CSN: [id number] Arrival date & time: 08/20/16  4696     History   Chief Complaint Chief Complaint  Patient presents with  . Foreign Body    HPI [name] is a 83 y.o. male.  HPI  Patient was sent in by Dr. Lorriane Shire. Yesterday he stepped on a sharp rusted metal piece that then went into his foot. States it went through his shoe. Today started feeling worse with chills pain in the foot. Went to see the doctor. And was found to have chills and a systolic blood pressure in the 90s. Sent in to rule out sepsis and monitor the infection. Has not been on antibiotics. He is diabetic. He has baseline limited feeling in his feet. Past Medical History:  Diagnosis Date  . BPH (benign prostatic hyperplasia)   . CAD (coronary artery disease)    Multivessel status post CABG 2011 - LIMA to LAD, SVG to diagonal, SVG to OM, SVG to PDA  . Cellulitis    12/15  . Chronic back pain   . Chronic diastolic CHF (congestive heart failure) ([place])   . CKD (chronic kidney disease), stage III   . COPD (chronic obstructive pulmonary disease) ([place])   . Essential hypertension   . Gastric mass    EGD 9/15  . GERD (gastroesophageal reflux disease)   . History of DVT (deep vein thrombosis)    Postphlebitic syndrome  . History of kidney stones   . HOH (hard of hearing)   . Hx of CABG   . Hyperlipidemia   . Persistent atrial fibrillation ([place])    a. s/p DCCV 03/2016.  [name] Sleep apnea    Stop Bang score of 5  . Type 2 diabetes mellitus [facility])     Patient Active Problem List   Diagnosis Date Noted  . Type 2 diabetes mellitus with hyperglycemia, with long-term current use of insulin ([place]) 07/24/2016  . Peripheral edema   . Pressure injury of skin 06/11/2016  . Atrial fibrillation with normal ventricular rate ([place]) 06/10/2016  . Esophageal stricture 06/09/2016  . CAD in native artery 06/08/2016  . Chest pain 06/08/2016  . Persistent atrial fibrillation ([place])  06/08/2016  . CKD (chronic kidney disease), stage III 06/08/2016  . Acute on chronic diastolic CHF (congestive heart failure) ([place]) 03/19/2016  . CAP (community acquired pneumonia) 03/19/2016  . Abnormal weight loss   . Cellulitis and abscess 12/18/2013  . Thrush, oral 12/18/2013  . Chest pain at rest 12/18/2013  . Leukocytosis 12/18/2013  . Diabetes ([place]) 12/18/2013  . Chronic back pain 12/18/2013  . Pulmonary vascular congestion 12/18/2013  . Dysphagia 12/18/2013  . Odynophagia 12/18/2013  . Gastric mass 09/07/2013  . Personal history of colonic polyps 08/05/2013  . Dysphagia, unspecified(787.20) 08/01/2013  . Nausea and vomiting 08/01/2013  . UTI (lower urinary tract infection) 12/09/2012  . Lower extremity edema 06/02/2012  . Dyspnea 06/02/2012  . Essential hypertension 06/02/2012  . History of DVT (deep vein thrombosis) 06/02/2012  . Obesity (BMI 30-39.9): BMI 31.3 06/02/2012    Past Surgical History:  Procedure Laterality Date  . CARDIOVERSION N/A 03/20/2016   Procedure: CARDIOVERSION;  Surgeon: Arnoldo Lenis, MD;  Location: [facility] ENDO SUITE;  Service: Endoscopy;  Laterality: N/A;  . CATARACT EXTRACTION W/PHACO Left 02/07/2016   Procedure: CATARACT EXTRACTION PHACO AND INTRAOCULAR LENS PLACEMENT (IOC);  Surgeon: [name], MD;  Location: [facility] ORS;  Service: Ophthalmology;  Laterality: Left;  CDE:  23.13  .  COLONOSCOPY  2004   Dr. Laural Golden: three small polyps at cecum, path unknown, external hemorrhoids  . COLONOSCOPY N/A 08/16/2013   Dr. [name]: incomplete prep. multiple tubular adenomas, multiple biopsies. Needs surveillance in Aug 2016 due to poor prep  . CORONARY ARTERY BYPASS GRAFT     x5  . CORONARY ARTERY BYPASS GRAFT  2011  . CYSTOSCOPY N/A 12/12/2012   Procedure: CYSTOSCOPY FLEXIBLE;  Surgeon: Marissa Nestle, MD;  Location: [facility] ORS;  Service: Urology;  Laterality: N/A;  . ESOPHAGOGASTRODUODENOSCOPY N/A 08/16/2013   Dr. [name]: normal esophagus s/p Maloney dilation,  gastric erosions, submucosal gastric mass vs extrinsic mass  . EUS N/A 09/07/2013   Dr. 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 INCISION AND DRAINAGE ABSCESS N/A 12/20/2013   Procedure: INCISION AND DRAINAGE ABSCESS NECK;  Surgeon: Jamesetta So, MD;  Location: [facility] ORS;  Service: General;  Laterality: N/A;  . Venia Minks DILATION N/A 08/16/2013   Procedure: Keturah Shavers;  Surgeon: Daneil Dolin, MD;  Location: [facility] END[facility] SUITE;  Service: Endoscopy;  Laterality: N/A;       Home Medications    Prior to Admission medications   Medication Sig Start Date End Date Taking? Authorizing Provider  amLODipine-olmesartan (AZOR) 5-40 MG tablet Take 1 tablet by mouth daily.    [provider]  apixaban (ELIQUIS) 5 MG TABS tablet Take 5 mg by mouth 2 (two) times daily.    [provider]  cholecalciferol (VITAMIN D) 1000 units tablet Take 5,000 Units by mouth daily.    [provider]  cloNIDine (CATAPRES) 0.1 MG tablet Take 0.1 mg by mouth 2 (two) times daily.    [provider]  docusate sodium (COLACE) 100 MG capsule Take 100 mg by mouth 2 (two) times daily.    [provider]  furosemide (LASIX) 20 MG tablet TAKE 20 MG IN THE AM AND ALTERNATE 20 MG IN THE EVENING 07/09/16   Lendon Colonel, NP  hydrocortisone cream 1 % Apply 1 application topically 2 (two) times daily.    [provider]  insulin glargine (LANTUS) 100 UNIT/ML injection Inject 0.5 mLs (50 Units total) into the skin at bedtime. 06/15/16   [name], MD  ipratropium-albuterol (DUONEB) 0.5-2.5 (3) MG/3ML SOLN Take 3 mLs by nebulization every 6 (six) hours as needed (wheezing).    [provider]  meclizine (ANTIVERT) 12.5 MG tablet Take 12.5 mg by mouth 2 (two) times daily.    [provider]  metoprolol succinate (TOPROL-XL) 25 MG 24 hr tablet Take 3 tablets (75 mg total) by mouth daily. Take with or immediately following a meal. 06/16/16    [name], MD  nitroGLYCERIN (NITROSTAT) 0.4 MG SL tablet Place 1 tablet (0.4 mg total) under the tongue every 5 (five) minutes as needed for chest pain. 12/22/13   [name], MD  oxyCODONE (ROXICODONE) 15 MG immediate release tablet Take 30 mg by mouth every 8 (eight) hours as needed for pain.     [provider]  pantoprazole (PROTONIX) 40 MG tablet Take 40 mg by mouth daily.    [provider]  potassium chloride (KLOR-CON) 8 MEQ tablet Take 1 tablet (8 mEq total) by mouth daily. 03/21/16   [name], MD  prednisoLONE acetate (PRED FORTE) 1 % ophthalmic suspension Place 1 drop into the left eye 4 (four) times daily.    [provider]  pregabalin (LYRICA) 50 MG capsule Take 50 mg by mouth daily.  [provider]  rosuvastatin (CRESTOR) 10 MG tablet Take 10 mg by mouth daily.    [provider]  silodosin (RAPAFLO) 8 MG CAPS capsule Take 8 mg by mouth daily with breakfast.    [provider]  timolol (BETIMOL) 0.5 % ophthalmic solution Place 1 drop into the left eye daily.    [provider]    Family History Family History  Problem Relation Age of Onset  . Colon cancer Son 3       deceased    Social History Social History  Substance Use Topics  . Smoking status: Former Smoker    Packs/day: 1.00    Years: 11.00    Quit date: 01/05/1956  . Smokeless tobacco: Never Used  . Alcohol use No     Allergies   Patient has no known allergies.   Review of Systems Review of Systems  Constitutional: Positive for chills.  HENT: Negative for congestion.   Respiratory: Positive for cough (occasional cough, nonproductive). Negative for chest tightness.   Cardiovascular: Negative for chest pain.  Gastrointestinal: Positive for nausea. Negative for abdominal pain.  Genitourinary: Negative for flank pain.  Musculoskeletal: Negative for back pain.  Skin: Positive for wound.  Neurological: Positive for  numbness.  Psychiatric/Behavioral: Negative for confusion.     Physical Exam Updated Vital Signs BP 106/62 (BP Location: Right Arm)   Pulse 79   Temp 97.8 F (36.6 C) (Rectal)   Resp (!) 22   Ht 5' 11.5" (1.816 m)   Wt 97.5 kg (215 lb)   SpO2 99%   BMI 29.57 kg/m   Physical Exam  Constitutional: He appears well-developed.  Patient is sitting in bed with 3 blankets wrapped around him.  HENT:  Head: Atraumatic.  Neck: Neck supple.  Cardiovascular: Normal rate.   Pulmonary/Chest: Effort normal. He has no wheezes. He has no rales.  Abdominal: Soft. There is no tenderness.  Musculoskeletal:  Approximately 3 cm puncture wound to all of left foot. Slight erythema. No fluctuance. With pressure only minimal purulent drainage. Did appear to have a small foreign body, also.  Neurological: He is alert.  Skin: Skin is warm.     ED Treatments / Results  Labs (all labs ordered are listed, but only abnormal results are displayed) Labs Reviewed  CBC WITH DIFFERENTIAL/PLATELET - Abnormal; Notable for the following:       Result Value   WBC 23.0 (*)    Neutro Abs 21.0 (*)    All other components within normal limits  BASIC METABOLIC PANEL - Abnormal; Notable for the following:    Glucose, Bld 105 (*)    BUN 43 (*)    Creatinine, Ser 1.90 (*)    GFR calc non Af Amer 31 (*)    GFR calc Af Amer 36 (*)    All other components within normal limits  I-STAT CG4 LACTIC ACID, ED    EKG  EKG Interpretation None       Radiology Dg Chest 2 View  Result Date: 08/20/2016 CLINICAL DATA:  Chills. EXAM: CHEST  2 VIEW COMPARISON:  07/24/2016 FINDINGS: Prior CABG. Cardiomegaly with vascular congestion and diffuse interstitial/alveolar opacities, likely edema/ CHF. No effusions or acute bony abnormality. IMPRESSION: Mild CHF. Electronically Signed   By: Rolm Baptise M.D.   On: 08/20/2016 10:57   Dg Foot Complete Left  Result Date: 08/20/2016 CLINICAL DATA:  Removal of foreign body.   Chills and pain. EXAM: LEFT FOOT - COMPLETE 3+ VIEW COMPARISON:  No recent . FINDINGS: Mild diffuse soft tissue swelling. Peripheral vascular calcification. No radiopaque foreign body. Diffuse degenerative change. No acute bony abnormality identified. No evidence of fracture or dislocation. If osteomyelitis is of concern MRI can be obtained IMPRESSION: 1. Mild diffuse soft tissue swelling. Peripheral vascular disease. No radiopaque foreign body. 2. No acute or focal bony abnormality identified. Diffuse degenerative disease. Electronically Signed   By: Marcello Moores  Register   On: 08/20/2016 10:12    Procedures Procedures (including critical care time)  Medications Ordered in ED Medications - No data to display   Initial Impression / Assessment and Plan / ED Course  I have reviewed the triage vital signs and the nursing notes.  Pertinent labs & imaging results that were available during my care of the patient were reviewed by me and considered in my medical decision making (see chart for details).     Patient with puncture wound of foot. Potential infection. White count elevated. Normal lactic acid. Feel he would benefit from admission for antibiotics and potential orthotopic evaluation.  Final Clinical Impressions(s) / ED Diagnoses   Final diagnoses:  Puncture wound of left foot, initial encounter  Cellulitis of foot    New Prescriptions New Prescriptions   No medications on file     Davonna Belling, MD 08/20/16 1241

## 2016-08-21 LAB — CBC
HEMATOCRIT: 40 % (ref 39.0–52.0)
Hemoglobin: 12.5 g/dL — ABNORMAL LOW (ref 13.0–17.0)
MCH: 27.9 pg (ref 26.0–34.0)
MCHC: 31.3 g/dL (ref 30.0–36.0)
MCV: 89.3 fL (ref 78.0–100.0)
PLATELETS: 204 10*3/uL (ref 150–400)
RBC: 4.48 MIL/uL (ref 4.22–5.81)
RDW: 14.7 % (ref 11.5–15.5)
WBC: 20 10*3/uL — AB (ref 4.0–10.5)

## 2016-08-21 LAB — COMPREHENSIVE METABOLIC PANEL
ALT: 15 U/L — ABNORMAL LOW (ref 17–63)
AST: 15 U/L (ref 15–41)
Albumin: 3.6 g/dL (ref 3.5–5.0)
Alkaline Phosphatase: 86 U/L (ref 38–126)
Anion gap: 10 (ref 5–15)
BILIRUBIN TOTAL: 1.2 mg/dL (ref 0.3–1.2)
BUN: 41 mg/dL — AB (ref 6–20)
CO2: 25 mmol/L (ref 22–32)
Calcium: 9.5 mg/dL (ref 8.9–10.3)
Chloride: 104 mmol/L (ref 101–111)
Creatinine, Ser: 1.92 mg/dL — ABNORMAL HIGH (ref 0.61–1.24)
GFR calc Af Amer: 36 mL/min — ABNORMAL LOW (ref >60)
GFR, EST NON AFRICAN AMERICAN: 31 mL/min — AB (ref >60)
Glucose, Bld: 79 mg/dL (ref 65–99)
POTASSIUM: 3.9 mmol/L (ref 3.5–5.1)
Sodium: 139 mmol/L (ref 135–145)
TOTAL PROTEIN: 6.8 g/dL (ref 6.5–8.1)

## 2016-08-21 LAB — GLUCOSE, CAPILLARY
GLUCOSE-CAPILLARY: 86 mg/dL (ref 65–99)
GLUCOSE-CAPILLARY: 83 mg/dL (ref 65–99)
GLUCOSE-CAPILLARY: 131 mg/dL — AB (ref 65–99)
Glucose-Capillary: 114 mg/dL — ABNORMAL HIGH (ref 65–99)
Glucose-Capillary: 146 mg/dL — ABNORMAL HIGH (ref 65–99)

## 2016-08-21 NOTE — Progress Notes (Signed)
Inpatient Diabetes Program Recommendations  AACE/ADA: New Consensus Statement on Inpatient Glycemic Control (2015)  Target Ranges:  Prepandial:   less than 140 mg/dL      Peak postprandial:   less than 180 mg/dL (1-2 hours)      Critically ill patients:  140 - 180 mg/dL   Lab Results  Component Value Date   GLUCAP 83 08/21/2016   HGBA1C 9.8 (H) 08/20/2016    Review of Glycemic Control  Results for [name], [name] (MRN [id number]) as of 08/21/2016 10:09  Ref. Range 08/20/2016 17:38 08/20/2016 20:30 08/21/2016 07:43  Glucose-Capillary Latest Ref Range: 65 - 99 mg/dL 66 86 83    Diabetes history: Type 2 Outpatient Diabetes medications: Lantus 50 units qhs Current orders for Inpatient glycemic control: Lantus 50 units qhs, Novolog 0-9 units tid  Inpatient Diabetes Program Recommendations:  Please consider decreasing Lantus to 40 units qhs.  Continue Novolog 0-9 units tid as ordered.   Gentry Fitz, RN, BA, MHA, CDE Diabetes Coordinator Inpatient Diabetes Program  [phone] (Team Pager) [phone] ([place]) 08/21/2016 10:11 AM

## 2016-08-21 NOTE — Progress Notes (Signed)
Notified from Central Telemetry that pt had a 2.5 second atrial pause. MD made aware. Will continue to monitor.

## 2016-08-21 NOTE — Care Management Note (Signed)
Case Management Note  Patient Details  Name: [name] MRN: [id number] Date of Birth: 12/21/1931  Subjective/Objective:   Chart reviewed. No CM consult. Per previous notes, "He is from home, lives alone and has daughter who is very supportive. Pt is ind with ADL's. He drives himself to appointments. He has PCP and insurance with drug coverage".   Action/Plan: Anticipate DC home with self care.   Expected Discharge Date:  08/22/16               Expected Discharge Plan:  Home/Self Care  In-House Referral:     Discharge planning Services  CM Consult  Post Acute Care Choice:  NA Choice offered to:  NA  DME Arranged:    DME Agency:     HH Arranged:    HH Agency:     Status of Service:  Completed, signed off  If discussed at [employer] of Stay Meetings, dates discussed:    Additional Comments:  Albert Devaul, Chauncey Reading, RN 08/21/2016, 9:40 AM

## 2016-08-21 NOTE — Progress Notes (Signed)
[name] VWU:[id number] DOB: [birth year] DOA: 08/20/2016 PCP: [name], MD   Physical Exam: Blood pressure (!) 107/53, pulse 86, temperature 98.5 F (36.9 C), temperature source Oral, resp. rate 18, height 5\' 11"  (1.803 m), weight 97.5 kg (215 lb), SpO2 94 %. Lungs diminished breath sounds in the bases no rales wheeze rhonchi appreciable heart regular rhythm no murmurs gallops heaves thrills rubs   Investigations:  Recent Results (from the past 240 hour(s))  Culture, blood (routine x 2)     Status: None (Preliminary result)   Collection Time: 08/20/16  3:51 PM  Result Value Ref Range Status   Specimen Description RIGHT ANTECUBITAL  Final   Special Requests   Final    BOTTLES DRAWN AEROBIC AND ANAEROBIC Blood Culture results may not be optimal due to an inadequate volume of blood received in culture bottles   Culture NO GROWTH < 24 HOURS  Final   Report Status PENDING  Incomplete  Culture, blood (routine x 2)     Status: None (Preliminary result)   Collection Time: 08/20/16  3:51 PM  Result Value Ref Range Status   Specimen Description BLOOD RIGHT HAND  Final   Special Requests   Final    BOTTLES DRAWN AEROBIC AND ANAEROBIC Blood Culture adequate volume   Culture NO GROWTH < 24 HOURS  Final   Report Status PENDING  Incomplete     Basic Metabolic Panel:  Recent Labs  08/20/16 1100 08/21/16 0553  NA 138 139  K 4.2 3.9  CL 102 104  CO2 26 25  GLUCOSE 105* 79  BUN 43* 41*  CREATININE 1.90* 1.92*  CALCIUM 9.9 9.5   Liver Function Tests:  Recent Labs  08/21/16 0553  AST 15  ALT 15*  ALKPHOS 86  BILITOT 1.2  PROT 6.8  ALBUMIN 3.6     CBC:  Recent Labs  08/20/16 1100 08/21/16 0553  WBC 23.0* 20.0*  NEUTROABS 21.0*  --   HGB 13.1 12.5*  HCT 41.1 40.0  MCV 86.7 89.3  PLT 215 204    Dg Chest 2 View  Result Date: 08/20/2016 CLINICAL DATA:  Chills. EXAM: CHEST  2 VIEW COMPARISON:  07/24/2016 FINDINGS: Prior CABG. Cardiomegaly with vascular  congestion and diffuse interstitial/alveolar opacities, likely edema/ CHF. No effusions or acute bony abnormality. IMPRESSION: Mild CHF. Electronically Signed   By: Rolm Baptise M.D.   On: 08/20/2016 10:57   Mr Foot Left Wo Contrast  Result Date: 08/20/2016 CLINICAL DATA:  Diabetic patient suffered a puncture wound on the plantar surface of the left foot 1 day ago. Fever and chills. EXAM: MRI OF THE LEFT FOOT WITHOUT CONTRAST TECHNIQUE: Multiplanar, multisequence MR imaging of the left foot was performed. No intravenous contrast was administered. COMPARISON:  Plain films left foot earlier today. FINDINGS: Bones/Joint/Cartilage There is no bone marrow signal abnormality to suggest osteomyelitis. No joint effusion is seen. There is no fracture or stress change. Ligaments Intact. Muscles and Tendons Intact. Soft tissues No fluid collection. Small focus of susceptibility artifact is seen on the plantar surface of the foot in the cutaneous tissues just lateral to the third MTP joint. IMPRESSION: Negative for abscess, osteomyelitis or septic joint. Small focus of susceptibility artifact in the cutaneous tissues on the plantar surface of the foot just medial to the third MTP joint is consistent with the presence of a metallic foreign body which could be microscopic. Note is made that no foreign body is seen on the prior plain films. Electronically Signed  By: Inge Rise M.D.   On: 08/20/2016 15:58   Dg Foot Complete Left  Result Date: 08/20/2016 CLINICAL DATA:  Removal of foreign body.  Chills and pain. EXAM: LEFT FOOT - COMPLETE 3+ VIEW COMPARISON:  No recent . FINDINGS: Mild diffuse soft tissue swelling. Peripheral vascular calcification. No radiopaque foreign body. Diffuse degenerative change. No acute bony abnormality identified. No evidence of fracture or dislocation. If osteomyelitis is of concern MRI can be obtained IMPRESSION: 1. Mild diffuse soft tissue swelling. Peripheral vascular disease. No  radiopaque foreign body. 2. No acute or focal bony abnormality identified. Diffuse degenerative disease. Electronically Signed   By: Tiro   On: 08/20/2016 10:12      Medications:   Impression:  Active Problems:   Cellulitis     Plan: Continue dual antibiotics for resolution of cellulitis of left foot. Monitor hemodynamics blood pressure leukocytosis renal function as well as glycemic control  Consultants: General surgery   Procedures   Antibiotics: Meropenem and Zosyn          Time spent: 30 minutes   LOS: 1 day   Dennise Bamber M   08/21/2016, 12:18 PM

## 2016-08-21 NOTE — Care Management Important Message (Signed)
Important Message  Patient Details  Name: ITAMAR MCGOWAN MRN: [id number] Date of Birth: 1931/10/27   Medicare Important Message Given:  Yes    Ayane Delancey, Chauncey Reading, RN 08/21/2016, 8:20 AM

## 2016-08-22 LAB — GLUCOSE, CAPILLARY
GLUCOSE-CAPILLARY: 197 mg/dL — AB (ref 65–99)
GLUCOSE-CAPILLARY: 180 mg/dL — AB (ref 65–99)
Glucose-Capillary: 221 mg/dL — ABNORMAL HIGH (ref 65–99)
Glucose-Capillary: 170 mg/dL — ABNORMAL HIGH (ref 65–99)

## 2016-08-22 NOTE — Progress Notes (Signed)
Subjective: Patient is resting. He is receving IV antibiotics. Patient is complaining poor appetite. No fever or chills.  Objective: Vital signs in last 24 hours: Temp:  [98.1 F (36.7 C)-99.4 F (37.4 C)] 98.5 F (36.9 C) (08/18 0516) Pulse Rate:  [62-85] 77 (08/18 0516) Resp:  [18-19] 18 (08/18 0516) BP: (112-123)/(50-63) 112/51 (08/18 0516) SpO2:  [90 %-96 %] 96 % (08/18 0516) Weight change:  Last BM Date: 08/19/16  Intake/Output from previous day: 08/17 0701 - 08/18 0700 In: 960 [P.O.:960] Out: 1200 [Urine:1200]  PHYSICAL EXAM General appearance: alert and no distress Resp: clear to auscultation bilaterally Cardio: S1, S2 normal GI: soft, non-tender; bowel sounds normal; no masses,  no organomegaly Extremities: redness aand tebnderness of the left foot  Lab Results:  Results for orders placed or performed during the hospital encounter of 08/20/16 (from the past 48 hour(s))  I-Stat CG4 Lactic Acid, ED     Status: None   Collection Time: 08/20/16 11:45 AM  Result Value Ref Range   Lactic Acid, Venous 1.44 0.5 - 1.9 mmol/L  Hemoglobin A1c     Status: Abnormal   Collection Time: 08/20/16  2:00 PM  Result Value Ref Range   Hgb A1c MFr Bld 9.8 (H) 4.8 - 5.6 %    Comment: (NOTE) Pre diabetes:          5.7%-6.4% Diabetes:              >6.4% Glycemic control for   <7.0% adults with diabetes    Mean Plasma Glucose 234.56 mg/dL    Comment: Performed at [facility], [address] [place], [place] [address]  Culture, blood (routine x 2)     Status: None (Preliminary result)   Collection Time: 08/20/16  3:51 PM  Result Value Ref Range   Specimen Description RIGHT ANTECUBITAL    Special Requests      BOTTLES DRAWN AEROBIC AND ANAEROBIC Blood Culture results may not be optimal due to an inadequate volume of blood received in culture bottles   Culture NO GROWTH 2 DAYS    Report Status PENDING   Culture, blood (routine x 2)     Status: None (Preliminary result)   Collection Time: 08/20/16  3:51 PM  Result Value Ref Range   Specimen Description BLOOD RIGHT HAND    Special Requests      BOTTLES DRAWN AEROBIC AND ANAEROBIC Blood Culture adequate volume   Culture NO GROWTH 2 DAYS    Report Status PENDING   CBG monitoring, ED     Status: None   Collection Time: 08/20/16  5:38 PM  Result Value Ref Range   Glucose-Capillary 66 65 - 99 mg/dL  Glucose, capillary     Status: None   Collection Time: 08/20/16  8:30 PM  Result Value Ref Range   Glucose-Capillary 86 65 - 99 mg/dL   Comment 1 Notify RN    Comment 2 Document in Chart   Comprehensive metabolic panel     Status: Abnormal   Collection Time: 08/21/16  5:53 AM  Result Value Ref Range   Sodium 139 135 - 145 mmol/L   Potassium 3.9 3.5 - 5.1 mmol/L   Chloride 104 101 - 111 mmol/L   CO2 25 22 - 32 mmol/L   Glucose, Bld 79 65 - 99 mg/dL   BUN 41 (H) 6 - 20 mg/dL   Creatinine, Ser 1.92 (H) 0.61 - 1.24 mg/dL   Calcium 9.5 8.9 - 10.3 mg/dL   Total  Protein 6.8 6.5 - 8.1 g/dL   Albumin 3.6 3.5 - 5.0 g/dL   AST 15 15 - 41 U/L   ALT 15 (L) 17 - 63 U/L   Alkaline Phosphatase 86 38 - 126 U/L   Total Bilirubin 1.2 0.3 - 1.2 mg/dL   GFR calc non Af Amer 31 (L) >60 mL/min   GFR calc Af Amer 36 (L) >60 mL/min    Comment: (NOTE) The eGFR has been calculated using the CKD EPI equation. This calculation has not been validated in all clinical situations. eGFR's persistently <60 mL/min signify possible Chronic Kidney Disease.    Anion gap 10 5 - 15  CBC     Status: Abnormal   Collection Time: 08/21/16  5:53 AM  Result Value Ref Range   WBC 20.0 (H) 4.0 - 10.5 K/uL   RBC 4.48 4.22 - 5.81 MIL/uL   Hemoglobin 12.5 (L) 13.0 - 17.0 g/dL   HCT 40.0 39.0 - 52.0 %   MCV 89.3 78.0 - 100.0 fL   MCH 27.9 26.0 - 34.0 pg   MCHC 31.3 30.0 - 36.0 g/dL   RDW 14.7 11.5 - 15.5 %   Platelets 204 150 - 400 K/uL  Glucose, capillary     Status: None   Collection Time: 08/21/16  7:43 AM  Result Value Ref Range    Glucose-Capillary 83 65 - 99 mg/dL  Glucose, capillary     Status: Abnormal   Collection Time: 08/21/16 11:10 AM  Result Value Ref Range   Glucose-Capillary 114 (H) 65 - 99 mg/dL  Glucose, capillary     Status: Abnormal   Collection Time: 08/21/16  4:38 PM  Result Value Ref Range   Glucose-Capillary 131 (H) 65 - 99 mg/dL  Glucose, capillary     Status: Abnormal   Collection Time: 08/21/16  8:57 PM  Result Value Ref Range   Glucose-Capillary 146 (H) 65 - 99 mg/dL   Comment 1 Notify RN    Comment 2 Document in Chart   Glucose, capillary     Status: Abnormal   Collection Time: 08/22/16  7:27 AM  Result Value Ref Range   Glucose-Capillary 221 (H) 65 - 99 mg/dL    ABGS No results for input(s): PHART, PO2ART, TCO2, HCO3 in the last 72 hours.  Invalid input(s): PCO2 CULTURES Recent Results (from the past 240 hour(s))  Culture, blood (routine x 2)     Status: None (Preliminary result)   Collection Time: 08/20/16  3:51 PM  Result Value Ref Range Status   Specimen Description RIGHT ANTECUBITAL  Final   Special Requests   Final    BOTTLES DRAWN AEROBIC AND ANAEROBIC Blood Culture results may not be optimal due to an inadequate volume of blood received in culture bottles   Culture NO GROWTH 2 DAYS  Final   Report Status PENDING  Incomplete  Culture, blood (routine x 2)     Status: None (Preliminary result)   Collection Time: 08/20/16  3:51 PM  Result Value Ref Range Status   Specimen Description BLOOD RIGHT HAND  Final   Special Requests   Final    BOTTLES DRAWN AEROBIC AND ANAEROBIC Blood Culture adequate volume   Culture NO GROWTH 2 DAYS  Final   Report Status PENDING  Incomplete   Studies/Results: Mr Foot Left Wo Contrast  Result Date: 08/20/2016 CLINICAL DATA:  Diabetic patient suffered a puncture wound on the plantar surface of the left foot 1 day ago. Fever and chills. EXAM: MRI  OF THE LEFT FOOT WITHOUT CONTRAST TECHNIQUE: Multiplanar, multisequence MR imaging of the left  foot was performed. No intravenous contrast was administered. COMPARISON:  Plain films left foot earlier today. FINDINGS: Bones/Joint/Cartilage There is no bone marrow signal abnormality to suggest osteomyelitis. No joint effusion is seen. There is no fracture or stress change. Ligaments Intact. Muscles and Tendons Intact. Soft tissues No fluid collection. Small focus of susceptibility artifact is seen on the plantar surface of the foot in the cutaneous tissues just lateral to the third MTP joint. IMPRESSION: Negative for abscess, osteomyelitis or septic joint. Small focus of susceptibility artifact in the cutaneous tissues on the plantar surface of the foot just medial to the third MTP joint is consistent with the presence of a metallic foreign body which could be microscopic. Note is made that no foreign body is seen on the prior plain films. Electronically Signed   By: Inge Rise M.D.   On: 08/20/2016 15:58    Medications: I have reviewed the patient's current medications.  Assesment:  Active Problems:   Cellulitis DM type Hypertenscion   Plan:  medications reviewed conitinue IV antibiotics Continue regular treatment    LOS: 2 days   [name] 08/22/2016, 11:10 AM

## 2016-08-23 LAB — BASIC METABOLIC PANEL
Anion gap: 11 (ref 5–15)
BUN: 62 mg/dL — AB (ref 6–20)
CALCIUM: 9.6 mg/dL (ref 8.9–10.3)
CO2: 24 mmol/L (ref 22–32)
CREATININE: 3.2 mg/dL — AB (ref 0.61–1.24)
Chloride: 102 mmol/L (ref 101–111)
GFR calc Af Amer: 19 mL/min — ABNORMAL LOW (ref >60)
GFR, EST NON AFRICAN AMERICAN: 17 mL/min — AB (ref >60)
GLUCOSE: 135 mg/dL — AB (ref 65–99)
Potassium: 4.3 mmol/L (ref 3.5–5.1)
Sodium: 137 mmol/L (ref 135–145)

## 2016-08-23 LAB — CBC
HCT: 36.2 % — ABNORMAL LOW (ref 39.0–52.0)
Hemoglobin: 11.4 g/dL — ABNORMAL LOW (ref 13.0–17.0)
MCH: 28 pg (ref 26.0–34.0)
MCHC: 31.5 g/dL (ref 30.0–36.0)
MCV: 88.9 fL (ref 78.0–100.0)
Platelets: 192 K/uL (ref 150–400)
RBC: 4.07 MIL/uL — ABNORMAL LOW (ref 4.22–5.81)
RDW: 14.6 % (ref 11.5–15.5)
WBC: 21 K/uL — ABNORMAL HIGH (ref 4.0–10.5)

## 2016-08-23 LAB — GLUCOSE, CAPILLARY
Glucose-Capillary: 130 mg/dL — ABNORMAL HIGH (ref 65–99)
Glucose-Capillary: 121 mg/dL — ABNORMAL HIGH (ref 65–99)
Glucose-Capillary: 146 mg/dL — ABNORMAL HIGH (ref 65–99)
Glucose-Capillary: 101 mg/dL — ABNORMAL HIGH (ref 65–99)

## 2016-08-23 MED ORDER — SODIUM CHLORIDE 0.9 % IV SOLN
500.0000 mg | Freq: Two times a day (BID) | INTRAVENOUS | Status: DC
  Administered 2016-08-23 – 2016-08-27 (×8): 500 mg via INTRAVENOUS
  Filled 2016-08-23 (×10): qty 0.5

## 2016-08-23 NOTE — Progress Notes (Signed)
Subjective: Patient is receiving IV antibiotics. His cellulitis is improving. He is complaqining of poor appetite. His renal function is deteriorating.  Objective: Vital signs in last 24 hours: Temp:  [98.3 F (36.8 C)-99.3 F (37.4 C)] 98.9 F (37.2 C) (08/19 0450) Pulse Rate:  [80-95] 85 (08/19 0450) Resp:  [20] 20 (08/19 0450) BP: (108-121)/(55-57) 108/55 (08/19 0450) SpO2:  [86 %-95 %] 94 % (08/19 0450) Weight change:  Last BM Date: 08/22/16  Intake/Output from previous day: 08/18 0701 - 08/19 0700 In: 1630 [P.O.:1630] Out: 1800 [Urine:1800]  PHYSICAL EXAM General appearance: alert and no distress Resp: clear to auscultation bilaterally Cardio: S1, S2 normal GI: soft, non-tender; bowel sounds normal; no masses,  no organomegaly Extremities: redness aand tebnderness of the left foot  Lab Results:  Results for orders placed or performed during the hospital encounter of 08/20/16 (from the past 48 hour(s))  Glucose, capillary     Status: Abnormal   Collection Time: 08/21/16 11:10 AM  Result Value Ref Range   Glucose-Capillary 114 (H) 65 - 99 mg/dL  Glucose, capillary     Status: Abnormal   Collection Time: 08/21/16  4:38 PM  Result Value Ref Range   Glucose-Capillary 131 (H) 65 - 99 mg/dL  Glucose, capillary     Status: Abnormal   Collection Time: 08/21/16  8:57 PM  Result Value Ref Range   Glucose-Capillary 146 (H) 65 - 99 mg/dL   Comment 1 Notify RN    Comment 2 Document in Chart   Glucose, capillary     Status: Abnormal   Collection Time: 08/22/16  7:27 AM  Result Value Ref Range   Glucose-Capillary 221 (H) 65 - 99 mg/dL  Glucose, capillary     Status: Abnormal   Collection Time: 08/22/16 11:19 AM  Result Value Ref Range   Glucose-Capillary 197 (H) 65 - 99 mg/dL  Glucose, capillary     Status: Abnormal   Collection Time: 08/22/16  5:11 PM  Result Value Ref Range   Glucose-Capillary 180 (H) 65 - 99 mg/dL  Glucose, capillary     Status: Abnormal   Collection  Time: 08/22/16  8:30 PM  Result Value Ref Range   Glucose-Capillary 170 (H) 65 - 99 mg/dL   Comment 1 Notify RN    Comment 2 Document in Chart   CBC     Status: Abnormal   Collection Time: 08/23/16  5:54 AM  Result Value Ref Range   WBC 21.0 (H) 4.0 - 10.5 K/uL   RBC 4.07 (L) 4.22 - 5.81 MIL/uL   Hemoglobin 11.4 (L) 13.0 - 17.0 g/dL   HCT 36.2 (L) 39.0 - 52.0 %   MCV 88.9 78.0 - 100.0 fL   MCH 28.0 26.0 - 34.0 pg   MCHC 31.5 30.0 - 36.0 g/dL   RDW 14.6 11.5 - 15.5 %   Platelets 192 150 - 400 K/uL  Basic metabolic panel     Status: Abnormal   Collection Time: 08/23/16  5:54 AM  Result Value Ref Range   Sodium 137 135 - 145 mmol/L   Potassium 4.3 3.5 - 5.1 mmol/L   Chloride 102 101 - 111 mmol/L   CO2 24 22 - 32 mmol/L   Glucose, Bld 135 (H) 65 - 99 mg/dL   BUN 62 (H) 6 - 20 mg/dL   Creatinine, Ser 3.20 (H) 0.61 - 1.24 mg/dL   Calcium 9.6 8.9 - 10.3 mg/dL   GFR calc non Af Amer 17 (L) >60 mL/min  GFR calc Af Amer 19 (L) >60 mL/min    Comment: (NOTE) The eGFR has been calculated using the CKD EPI equation. This calculation has not been validated in all clinical situations. eGFR's persistently <60 mL/min signify possible Chronic Kidney Disease.    Anion gap 11 5 - 15  Glucose, capillary     Status: Abnormal   Collection Time: 08/23/16  7:31 AM  Result Value Ref Range   Glucose-Capillary 130 (H) 65 - 99 mg/dL    ABGS No results for input(s): PHART, PO2ART, TCO2, HCO3 in the last 72 hours.  Invalid input(s): PCO2 CULTURES Recent Results (from the past 240 hour(s))  Culture, blood (routine x 2)     Status: None (Preliminary result)   Collection Time: 08/20/16  3:51 PM  Result Value Ref Range Status   Specimen Description RIGHT ANTECUBITAL  Final   Special Requests   Final    BOTTLES DRAWN AEROBIC AND ANAEROBIC Blood Culture results may not be optimal due to an inadequate volume of blood received in culture bottles   Culture NO GROWTH 3 DAYS  Final   Report Status  PENDING  Incomplete  Culture, blood (routine x 2)     Status: None (Preliminary result)   Collection Time: 08/20/16  3:51 PM  Result Value Ref Range Status   Specimen Description BLOOD RIGHT HAND  Final   Special Requests   Final    BOTTLES DRAWN AEROBIC AND ANAEROBIC Blood Culture adequate volume   Culture NO GROWTH 3 DAYS  Final   Report Status PENDING  Incomplete   Studies/Results: No results found.  Medications: I have reviewed the patient's current medications.  Assesment:  Active Problems:   Cellulitis DM type Hypertenscion Acute on chronic renal failure  Plan:  medications reviewed conitinue IV antibiotics Will monitor BMP Nephrology consult Will D/C lasx Continue regular treatment    LOS: 3 days   Zacharias Ridling 08/23/2016, 10:59 AM

## 2016-08-23 NOTE — Progress Notes (Signed)
PHARMACY NOTE:  ANTIMICROBIAL RENAL DOSAGE ADJUSTMENT  Current antimicrobial regimen includes a mismatch between antimicrobial dosage and estimated renal function.  As per policy approved by the Pharmacy & Therapeutics and Medical Executive Committees, the antimicrobial dosage will be adjusted accordingly.  Current antimicrobial dosage:  Meropenem 1 GM IV every 12 hours  Indication: cellulitis  Renal Function:  Estimated Creatinine Clearance: 20.8 mL/min (A) (by C-G formula based on SCr of 3.2 mg/dL (H)). []      On intermittent HD, scheduled: []      On CRRT    Antimicrobial dosage has been changed to:  Meropenem 500 mg IV every 12 hours  Additional comments:   Thank you for allowing pharmacy to be a part of this patient's care.  Gildardo Griffes [place], [facility] 08/23/2016 8:22 AM

## 2016-08-24 ENCOUNTER — Inpatient Hospital Stay (HOSPITAL_COMMUNITY): Payer: Medicare Other

## 2016-08-24 LAB — COMPREHENSIVE METABOLIC PANEL
ALBUMIN: 2.8 g/dL — AB (ref 3.5–5.0)
ALT: 13 U/L — ABNORMAL LOW (ref 17–63)
ANION GAP: 13 (ref 5–15)
AST: 21 U/L (ref 15–41)
Alkaline Phosphatase: 94 U/L (ref 38–126)
BUN: 69 mg/dL — AB (ref 6–20)
CHLORIDE: 103 mmol/L (ref 101–111)
CO2: 24 mmol/L (ref 22–32)
Calcium: 9.7 mg/dL (ref 8.9–10.3)
Creatinine, Ser: 2.86 mg/dL — ABNORMAL HIGH (ref 0.61–1.24)
GFR calc Af Amer: 22 mL/min — ABNORMAL LOW (ref >60)
GFR calc non Af Amer: 19 mL/min — ABNORMAL LOW (ref >60)
GLUCOSE: 54 mg/dL — AB (ref 65–99)
POTASSIUM: 3.9 mmol/L (ref 3.5–5.1)
SODIUM: 140 mmol/L (ref 135–145)
TOTAL PROTEIN: 6.4 g/dL — AB (ref 6.5–8.1)
Total Bilirubin: 0.9 mg/dL (ref 0.3–1.2)

## 2016-08-24 LAB — GLUCOSE, CAPILLARY
GLUCOSE-CAPILLARY: 102 mg/dL — AB (ref 65–99)
GLUCOSE-CAPILLARY: 132 mg/dL — AB (ref 65–99)
Glucose-Capillary: 61 mg/dL — ABNORMAL LOW (ref 65–99)
Glucose-Capillary: 124 mg/dL — ABNORMAL HIGH (ref 65–99)

## 2016-08-24 MED ORDER — LORAZEPAM 1 MG PO TABS
1.0000 mg | ORAL_TABLET | Freq: Every day | ORAL | Status: DC
  Administered 2016-08-24 – 2016-08-25 (×2): 1 mg via ORAL
  Filled 2016-08-24 (×2): qty 1

## 2016-08-24 MED ORDER — SODIUM CHLORIDE 0.9 % IV SOLN
INTRAVENOUS | Status: DC
  Administered 2016-08-24 – 2016-08-27 (×6): via INTRAVENOUS

## 2016-08-24 MED ORDER — IPRATROPIUM-ALBUTEROL 0.5-2.5 (3) MG/3ML IN SOLN
3.0000 mL | Freq: Four times a day (QID) | RESPIRATORY_TRACT | Status: DC
  Administered 2016-08-24 – 2016-08-25 (×3): 3 mL via RESPIRATORY_TRACT
  Filled 2016-08-24 (×3): qty 3

## 2016-08-24 MED ORDER — INSULIN GLARGINE 100 UNIT/ML ~~LOC~~ SOLN
40.0000 [IU] | Freq: Every day | SUBCUTANEOUS | Status: DC
  Administered 2016-08-24 – 2016-08-26 (×3): 40 [IU] via SUBCUTANEOUS
  Filled 2016-08-24 (×4): qty 0.4

## 2016-08-24 NOTE — Progress Notes (Signed)
Patient's 24 hours started earlier today.  24 hour urine had to be restarted for this evening.  Patient up to bathroom to void and missed the urinal.

## 2016-08-24 NOTE — Progress Notes (Signed)
RT notified of need for breathing treatment

## 2016-08-24 NOTE — Progress Notes (Signed)
Patient voiced back pain plantar aspect of foot still sore and tender currently on dual antibiotics seeing renal due to worsening renal presumably ATN will monitor electrolytes and renal function in a.m. we'll get physical therapy for strengthening and ambulation out of bed to chair 3 times a day [name] LNL:[id number] DOB: 11-17-1931 DOA: 08/20/2016 PCP: Phillip Gaskins, MD   Physical Exam: Blood pressure (!) 135/56, pulse 92, temperature 100.2 F (37.9 C), temperature source Oral, resp. rate 18, height 5\' 11"  (1.803 m), weight 97.5 kg (215 lb), SpO2 93 %. Lungs clear to A&P no rales wheeze rhonchi heart regular rhythm no S3 or S4 knee feels rubs abdomen soft nontender bowel sounds normoactive   Investigations:  Recent Results (from the past 240 hour(s))  Culture, blood (routine x 2)     Status: None (Preliminary result)   Collection Time: 08/20/16  3:51 PM  Result Value Ref Range Status   Specimen Description RIGHT ANTECUBITAL  Final   Special Requests   Final    BOTTLES DRAWN AEROBIC AND ANAEROBIC Blood Culture results may not be optimal due to an inadequate volume of blood received in culture bottles   Culture NO GROWTH 4 DAYS  Final   Report Status PENDING  Incomplete  Culture, blood (routine x 2)     Status: None (Preliminary result)   Collection Time: 08/20/16  3:51 PM  Result Value Ref Range Status   Specimen Description BLOOD RIGHT HAND  Final   Special Requests   Final    BOTTLES DRAWN AEROBIC AND ANAEROBIC Blood Culture adequate volume   Culture NO GROWTH 4 DAYS  Final   Report Status PENDING  Incomplete     Basic Metabolic Panel:  Recent Labs  08/23/16 0554 08/24/16 0557  NA 137 140  K 4.3 3.9  CL 102 103  CO2 24 24  GLUCOSE 135* 54*  BUN 62* 69*  CREATININE 3.20* 2.86*  CALCIUM 9.6 9.7   Liver Function Tests:  Recent Labs  08/24/16 0557  AST 21  ALT 13*  ALKPHOS 94  BILITOT 0.9  PROT 6.4*  ALBUMIN 2.8*     CBC:  Recent Labs   08/23/16 0554  WBC 21.0*  HGB 11.4*  HCT 36.2*  MCV 88.9  PLT 192    US Renal  Result Date: 08/24/2016 CLINICAL DATA:  Acute on chronic renal failure. History of BPH, hypertension, diabetes, coronary artery disease. EXAM: RENAL / URINARY TRACT ULTRASOUND COMPLETE COMPARISON:  CT scan of the chest and abdomen of June 10, 2016 FINDINGS: Right Kidney: Length: 13.8 cm. The renal cortical echotexture is approximately equal to that of the liver. There is no hydronephrosis nor cystic or solid mass. Left Kidney: Length: 12.8 cm. The renal cortical echotexture is similar to that on the right. There is no cystic or solid mass nor hydronephrosis. I cannot exclude a small amount of perinephric fluid on the left. Bladder: Appears normal for degree of bladder distention. IMPRESSION: Increased renal cortical echotexture consistent with medical renal disease. There is no hydronephrosis. There may be a small amount of perinephric fluid on the left. Normal appearance of the partially distended urinary bladder. Electronically Signed   By: David  Martinique M.D.   On: 08/24/2016 09:48      Medications:  Impression:  Active Problems:   Cellulitis     Plan: DuoNeb 4 times a day physical therapy for strengthening ambulation out of bed to chair 3 times a day continue dual antibiotics monitor renal function as  per nephrology  Consultants: Nephrology general surgery   Procedure   Antibiotics: Vancomycin aztreonam          Time spent: 30 minutes   LOS: 4 days   [name] M   08/24/2016, 2:44 PM

## 2016-08-24 NOTE — Progress Notes (Signed)
Hypoglycemic Event  CBG: 61  Treatment: 15 GM carbohydrate snack  Symptoms: None  Follow-up CBG: Time: 0915 CBG Result: 102  Possible Reasons for Event: Inadequate meal intake and Medication regimen: lantus  Comments/MD notified:  Dr. Cindie Laroche notified and Lantus dose reduced.    Verdis Prime

## 2016-08-24 NOTE — Progress Notes (Addendum)
Patient's CBG less than 70 this morning.  Poor intake.  Dr. Cindie Laroche notified.  Dr. Cindie Laroche gave order to decrease Lantus to 40 units and gave order for Ativan 1 mg po at bedtime.

## 2016-08-24 NOTE — Progress Notes (Signed)
Inpatient Diabetes Program Recommendations  AACE/ADA: New Consensus Statement on Inpatient Glycemic Control (2015)  Target Ranges:  Prepandial:   less than 140 mg/dL      Peak postprandial:   less than 180 mg/dL (1-2 hours)      Critically ill patients:  140 - 180 mg/dL   Lab Results  Component Value Date   GLUCAP 132 (H) 08/24/2016   HGBA1C 9.8 (H) 08/20/2016    Review of Glycemic Control  Diabetes history: Type 2 Outpatient Diabetes medications: Lantus 50 units qhs Current orders for Inpatient glycemic control: Lantus 50 units qhs, Novolog 0-9 units tid  Inpatient Diabetes Program Recommendations:  Please consider decreasing Lantus to 40 units qhs  Thank you, Bethena Roys E. Annemarie Sebree, RN, MSN, CDE  Diabetes Coordinator Inpatient Glycemic Control Team Team Pager [phone] (8am-5pm) 08/24/2016 1:47 PM

## 2016-08-24 NOTE — Consult Note (Signed)
Reason for Consult: Acute kidney injury superimposed on chronic Referring Physician: Dr. Chancy Milroy is an 83 y.o. male.  HPI: He is a patient who has history of hypertension, coronary artery disease, diabetes, chronic renal failure stage III presently admitted with complaints of foot pain, swelling, fever or after stepping on a metal screw. Presently he stated his feeling somewhat better but still has some pain. Patient denies any previous history of renal failure. He has previous history of kidney stone. Presently he denies any difficulty breathing more than usual. No cough or sputum production. He denies also any nausea or vomiting. His somewhat better since he came to the hospital.  Past Medical History:  Diagnosis Date  . BPH (benign prostatic hyperplasia)   . CAD (coronary artery disease)    Multivessel status post CABG 2011 - LIMA to LAD, SVG to diagonal, SVG to OM, SVG to PDA  . Cellulitis    12/15  . Chronic back pain   . Chronic diastolic CHF (congestive heart failure) ([place])   . CKD (chronic kidney disease), stage III   . COPD (chronic obstructive pulmonary disease) ([place])   . Essential hypertension   . Gastric mass    EGD 9/15  . GERD (gastroesophageal reflux disease)   . History of DVT (deep vein thrombosis)    Postphlebitic syndrome  . History of kidney stones   . HOH (hard of hearing)   . Hx of CABG   . Hyperlipidemia   . Persistent atrial fibrillation ([place])    a. s/p DCCV 03/2016.  [name] Sleep apnea    Stop Bang score of 5  . Type 2 diabetes mellitus ([place])     Past Surgical History:  Procedure Laterality Date  . CARDIOVERSION N/A 03/20/2016   Procedure: CARDIOVERSION;  Surgeon: Arnoldo Lenis, MD;  Location: [facility];  Service: Endoscopy;  Laterality: N/A;  . CATARACT EXTRACTION W/PHACO Left 02/07/2016   Procedure: CATARACT EXTRACTION PHACO AND INTRAOCULAR LENS PLACEMENT (IOC);  Surgeon: [name], MD;  Location: [facility];  Service: Ophthalmology;   Laterality: Left;  CDE:  23.13  . COLONOSCOPY  2004   Dr. Laural Golden: three small polyps at cecum, path unknown, external hemorrhoids  . COLONOSCOPY N/A 08/16/2013   Dr. [name]: incomplete prep. multiple tubular adenomas, multiple biopsies. Needs surveillance in Aug 2016 due to poor prep  . CORONARY ARTERY BYPASS GRAFT     x5  . CORONARY ARTERY BYPASS GRAFT  2011  . CYSTOSCOPY N/A 12/12/2012   Procedure: CYSTOSCOPY FLEXIBLE;  Surgeon: Marissa Nestle, MD;  Location: [facility];  Service: Urology;  Laterality: N/A;  . ESOPHAGOGASTRODUODENOSCOPY N/A 08/16/2013   Dr. [name]: normal esophagus s/p Maloney dilation, gastric erosions, submucosal gastric mass vs extrinsic mass  . EUS N/A 09/07/2013   Dr. 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 INCISION AND DRAINAGE ABSCESS N/A 12/20/2013   Procedure: INCISION AND DRAINAGE ABSCESS NECK;  Surgeon: Jamesetta So, MD;  Location: [facility];  Service: General;  Laterality: N/A;  . Venia Minks DILATION N/A 08/16/2013   Procedure: Keturah Shavers;  Surgeon: Daneil Dolin, MD;  Location: [facility];  Service: Endoscopy;  Laterality: N/A;    Family History  Problem Relation Age of Onset  . Colon cancer Son 4       deceased    Social History:  reports that he quit smoking about 60 years ago. He has a 11.00 pack-year smoking history. He has never used  smokeless tobacco. He reports that he does not drink alcohol or use drugs.  Allergies: No Known Allergies  Medications: I have reviewed the patient's current medications.  Results for orders placed or performed during the hospital encounter of 08/20/16 (from the past 48 hour(s))  Glucose, capillary     Status: Abnormal   Collection Time: 08/22/16 11:19 AM  Result Value Ref Range   Glucose-Capillary 197 (H) 65 - 99 mg/dL  Glucose, capillary     Status: Abnormal   Collection Time: 08/22/16  5:11 PM  Result Value Ref Range   Glucose-Capillary 180 (H) 65 - 99 mg/dL  Glucose, capillary      Status: Abnormal   Collection Time: 08/22/16  8:30 PM  Result Value Ref Range   Glucose-Capillary 170 (H) 65 - 99 mg/dL   Comment 1 Notify RN    Comment 2 Document in Chart   CBC     Status: Abnormal   Collection Time: 08/23/16  5:54 AM  Result Value Ref Range   WBC 21.0 (H) 4.0 - 10.5 K/uL   RBC 4.07 (L) 4.22 - 5.81 MIL/uL   Hemoglobin 11.4 (L) 13.0 - 17.0 g/dL   HCT 36.2 (L) 39.0 - 52.0 %   MCV 88.9 78.0 - 100.0 fL   MCH 28.0 26.0 - 34.0 pg   MCHC 31.5 30.0 - 36.0 g/dL   RDW 14.6 11.5 - 15.5 %   Platelets 192 150 - 400 K/uL  Basic metabolic panel     Status: Abnormal   Collection Time: 08/23/16  5:54 AM  Result Value Ref Range   Sodium 137 135 - 145 mmol/L   Potassium 4.3 3.5 - 5.1 mmol/L   Chloride 102 101 - 111 mmol/L   CO2 24 22 - 32 mmol/L   Glucose, Bld 135 (H) 65 - 99 mg/dL   BUN 62 (H) 6 - 20 mg/dL   Creatinine, Ser 3.20 (H) 0.61 - 1.24 mg/dL   Calcium 9.6 8.9 - 10.3 mg/dL   GFR calc non Af Amer 17 (L) >60 mL/min   GFR calc Af Amer 19 (L) >60 mL/min    Comment: (NOTE) The eGFR has been calculated using the CKD EPI equation. This calculation has not been validated in all clinical situations. eGFR's persistently <60 mL/min signify possible Chronic Kidney Disease.    Anion gap 11 5 - 15  Glucose, capillary     Status: Abnormal   Collection Time: 08/23/16  7:31 AM  Result Value Ref Range   Glucose-Capillary 130 (H) 65 - 99 mg/dL  Glucose, capillary     Status: Abnormal   Collection Time: 08/23/16 11:43 AM  Result Value Ref Range   Glucose-Capillary 121 (H) 65 - 99 mg/dL  Glucose, capillary     Status: Abnormal   Collection Time: 08/23/16  4:47 PM  Result Value Ref Range   Glucose-Capillary 146 (H) 65 - 99 mg/dL  Glucose, capillary     Status: Abnormal   Collection Time: 08/23/16  9:27 PM  Result Value Ref Range   Glucose-Capillary 101 (H) 65 - 99 mg/dL   Comment 1 Notify RN    Comment 2 Document in Chart   Comprehensive metabolic panel     Status: Abnormal    Collection Time: 08/24/16  5:57 AM  Result Value Ref Range   Sodium 140 135 - 145 mmol/L   Potassium 3.9 3.5 - 5.1 mmol/L   Chloride 103 101 - 111 mmol/L   CO2 24 22 - 32 mmol/L  Glucose, Bld 54 (L) 65 - 99 mg/dL   BUN 69 (H) 6 - 20 mg/dL   Creatinine, Ser 2.86 (H) 0.61 - 1.24 mg/dL   Calcium 9.7 8.9 - 10.3 mg/dL   Total Protein 6.4 (L) 6.5 - 8.1 g/dL   Albumin 2.8 (L) 3.5 - 5.0 g/dL   AST 21 15 - 41 U/L   ALT 13 (L) 17 - 63 U/L   Alkaline Phosphatase 94 38 - 126 U/L   Total Bilirubin 0.9 0.3 - 1.2 mg/dL   GFR calc non Af Amer 19 (L) >60 mL/min   GFR calc Af Amer 22 (L) >60 mL/min    Comment: (NOTE) The eGFR has been calculated using the CKD EPI equation. This calculation has not been validated in all clinical situations. eGFR's persistently <60 mL/min signify possible Chronic Kidney Disease.    Anion gap 13 5 - 15    No results found.  Review of Systems  Constitutional: Positive for fever.  Respiratory: Positive for cough and wheezing. Negative for shortness of breath.   Cardiovascular: Negative for chest pain, orthopnea and leg swelling.  Gastrointestinal: Negative for abdominal pain, nausea and vomiting.   Blood pressure 138/66, pulse 88, temperature 98.1 F (36.7 C), temperature source Oral, resp. rate 15, height 5' 11"  (1.803 m), weight 97.5 kg (215 lb), SpO2 97 %. Physical Exam  Constitutional: He is oriented to person, place, and time. No distress.  Eyes: No scleral icterus.  Neck: No JVD present.  Cardiovascular: Normal rate and regular rhythm.   Respiratory: He has no wheezes. He has no rales.  GI: He exhibits no distension. There is no tenderness.  Musculoskeletal: He exhibits no edema.  Patient has slight erythema on the on the lateral and plantar surface of his left foot  Neurological: He is alert and oriented to person, place, and time.    Assessment/Plan: Problem #1 acute kidney injury superimposed on chronic: Most likely secondary to renal  syndrome/ATN. Presently his kidney function is progressively worsening. Patient however does not have any nausea or vomiting. Problem #2 chronic renal failure: Creatinine was 0.78 on 02/02/2009                                                                               1.91 on 12/09/2012                                                                                1.44 on 12/21/2016  1.53 on 01/03/2016                                                                                 1.40 on 07/24/2016 with EGFR off 45 mL/m. He has stage III. July G could be secondary to diabetes/hypertension/recurrent AK I/age-related renal function loss. Problem #3 cellulitis of his left foot: Presently on antibiotics. Patient is afebrile. Problem #4 history of hypertension: His blood pressure is reasonably controlled Problem #5 history of diabetes: About 10 years. Patient denies any diabetic retinopathy or neuropathy. Problem #6 history of atrial fibrillation Problem #6 history of coronary artery disease Problem #8 history of COPD: Patient is on inhaler. Problem #9 proteinuria: Seems to be nonnephrotic range. Most likely secondary to diabetes however other etiology is at this moment cannot be ruled out.   plan: 1]We'll check ANA, complement, hepatitis B surface antigen, hepatitis B and C antibody 2]We'll do ultrasound of the kidneys 3]We'll do 24-hour urine for protein and immunoelectrophoresis 4]We'll start patient on normal saline at 1 25 mL per hour 5]We'll check his renal panel in the morning. 6]We'll check urine sodium and creatinine.                                                                                 1.9 2 on 08/21/2016  [facility] S 08/24/2016, 7:55 AM

## 2016-08-25 LAB — GLUCOSE, CAPILLARY
GLUCOSE-CAPILLARY: 50 mg/dL — AB (ref 65–99)
GLUCOSE-CAPILLARY: 182 mg/dL — AB (ref 65–99)
GLUCOSE-CAPILLARY: 183 mg/dL — AB (ref 65–99)
Glucose-Capillary: 127 mg/dL — ABNORMAL HIGH (ref 65–99)
Glucose-Capillary: 76 mg/dL (ref 65–99)
Glucose-Capillary: 119 mg/dL — ABNORMAL HIGH (ref 65–99)
Glucose-Capillary: 203 mg/dL — ABNORMAL HIGH (ref 65–99)

## 2016-08-25 LAB — RENAL FUNCTION PANEL
ALBUMIN: 2.5 g/dL — AB (ref 3.5–5.0)
Anion gap: 9 (ref 5–15)
BUN: 70 mg/dL — AB (ref 6–20)
CALCIUM: 9.2 mg/dL (ref 8.9–10.3)
CO2: 23 mmol/L (ref 22–32)
CREATININE: 2.42 mg/dL — AB (ref 0.61–1.24)
Chloride: 104 mmol/L (ref 101–111)
GFR calc Af Amer: 27 mL/min — ABNORMAL LOW (ref >60)
GFR, EST NON AFRICAN AMERICAN: 23 mL/min — AB (ref >60)
Glucose, Bld: 74 mg/dL (ref 65–99)
PHOSPHORUS: 5.3 mg/dL — AB (ref 2.5–4.6)
Potassium: 4.1 mmol/L (ref 3.5–5.1)
SODIUM: 136 mmol/L (ref 135–145)

## 2016-08-25 LAB — C4 COMPLEMENT: COMPLEMENT C4, BODY FLUID: 53 mg/dL — AB (ref 14–44)

## 2016-08-25 LAB — CULTURE, BLOOD (ROUTINE X 2)
CULTURE: NO GROWTH
Culture: NO GROWTH
SPECIAL REQUESTS: ADEQUATE

## 2016-08-25 LAB — HEPATITIS B SURFACE ANTIGEN: HEP B S AG: NEGATIVE

## 2016-08-25 LAB — HEPATITIS C ANTIBODY: HCV Ab: <0.1 s/co ratio (ref 0.0–0.9)

## 2016-08-25 LAB — ANTINUCLEAR ANTIBODIES, IFA: ANA Ab, IFA: NEGATIVE

## 2016-08-25 LAB — C3 COMPLEMENT: C3 COMPLEMENT: 163 mg/dL (ref 82–167)

## 2016-08-25 LAB — ANTISTREPTOLYSIN O TITER: ASO: <20 IU/mL (ref 0.0–200.0)

## 2016-08-25 MED ORDER — DEXTROSE 50 % IV SOLN
INTRAVENOUS | Status: AC
  Filled 2016-08-25: qty 50

## 2016-08-25 MED ORDER — IPRATROPIUM-ALBUTEROL 0.5-2.5 (3) MG/3ML IN SOLN
3.0000 mL | Freq: Four times a day (QID) | RESPIRATORY_TRACT | Status: DC
  Administered 2016-08-25 – 2016-08-27 (×7): 3 mL via RESPIRATORY_TRACT
  Filled 2016-08-25 (×7): qty 3

## 2016-08-25 MED ORDER — ACETAMINOPHEN 325 MG PO TABS
650.0000 mg | ORAL_TABLET | Freq: Four times a day (QID) | ORAL | Status: DC | PRN
  Administered 2016-08-26: 650 mg via ORAL
  Filled 2016-08-25: qty 2

## 2016-08-25 MED ORDER — DEXTROSE 50 % IV SOLN: 50.0000 mL | Freq: Once | INTRAVENOUS | Status: DC

## 2016-08-25 NOTE — Progress Notes (Signed)
Subjective: Interval History: has no complaint of difficulty breathing. He has some cough. No nausea or vomiting..  Objective: Vital signs in last 24 hours: Temp:  [98.6 F (37 C)-100.2 F (37.9 C)] 98.6 F (37 C) (08/21 0452) Pulse Rate:  [74-102] 74 (08/21 0452) Resp:  [18-20] 18 (08/21 0452) BP: (116-161)/(56-62) 116/62 (08/21 0452) SpO2:  [90 %-100 %] 100 % (08/21 0452) Weight change:   Intake/Output from previous day: 08/20 0701 - 08/21 0700 In: 3255 [P.O.:880; I.V.:2325; IV Piggyback:50] Out: 675 [Urine:675] Intake/Output this shift: No intake/output data recorded. Generally patient is somewhat sleepy but arousable. Chest is clear to auscultation Heart exam regular rate and rhythm Extremities no edema  Lab Results:  Recent Labs  08/23/16 0554  WBC 21.0*  HGB 11.4*  HCT 36.2*  PLT 192   BMET:  Recent Labs  08/24/16 0557 08/25/16 0553  NA 140 136  K 3.9 4.1  CL 103 104  CO2 24 23  GLUCOSE 54* 74  BUN 69* 70*  CREATININE 2.86* 2.42*  CALCIUM 9.7 9.2   No results for input(s): PTH in the last 72 hours. Iron Studies: No results for input(s): IRON, TIBC, TRANSFERRIN, FERRITIN in the last 72 hours.  Studies/Results: US Renal  Result Date: 08/24/2016 CLINICAL DATA:  Acute on chronic renal failure. History of BPH, hypertension, diabetes, coronary artery disease. EXAM: RENAL / URINARY TRACT ULTRASOUND COMPLETE COMPARISON:  CT scan of the chest and abdomen of June 10, 2016 FINDINGS: Right Kidney: Length: 13.8 cm. The renal cortical echotexture is approximately equal to that of the liver. There is no hydronephrosis nor cystic or solid mass. Left Kidney: Length: 12.8 cm. The renal cortical echotexture is similar to that on the right. There is no cystic or solid mass nor hydronephrosis. I cannot exclude a small amount of perinephric fluid on the left. Bladder: Appears normal for degree of bladder distention. IMPRESSION: Increased renal cortical echotexture consistent  with medical renal disease. There is no hydronephrosis. There may be a small amount of perinephric fluid on the left. Normal appearance of the partially distended urinary bladder. Electronically Signed   By: David  Martinique M.D.   On: 08/24/2016 09:48    I have reviewed the patient's current medications.  Assessment/Plan: Problem #1 acute kidney injury superimposed on chronic. Presently his renal function is progressively improving. Patient is nonoliguric. Problem #2 chronic renal failure: Possibly stage III. Thought to be secondary to diabetes/hypertension/ischemic. Ultrasound of the kidneys there is no hydronephrosis. Right kidney is 13.8 and left kidney is 12.8 cm. Problem #3 cellulitis of his foot Problem #4 history of COPD Problem #5 hypertension: His blood pressure is reasonably controlled Problem #6 diabetes Problem #7 anemia: His hemoglobin is within our target goal. Problem #8 proteinuria: Most likely secondary to diabetes. Presently hepatitis B surface antigen, hepatitis C antibody is negative and complement is normal. Problem #9 bone and mineral disorder: His calcium and phosphorus is the range. Plan: 1]We'll continue his present management 2]We'll check his renal panel in the morning.   LOS: 5 days   [name] S 08/25/2016,8:00 AM

## 2016-08-25 NOTE — Progress Notes (Signed)
Dr. Marin Comment paged and made aware of pts 2.13 second pause. Pt has order to d/c tele, will notify MD to see if he wants to keep tele.

## 2016-08-25 NOTE — Evaluation (Signed)
Physical Therapy Evaluation Patient Details Name: [name] MRN: [id number] DOB: Mar 20, 1931 Today's Date: 08/25/2016   History of Present Illness  [name] is a 83 y.o. male with medical history significant of dCHF, COPD, CKD 3, HTN, CAD, DM2, HLD, BPH presents from the office of Dr. Lorriane Shire for fevers and chills and low BP.  Clinical Impression  Pt received in bed and was awakened by therapist. Pt was agreeable to PT evaluation. He was admitted with the above diagnosis and currently presents with the deficits below (see "PT problem list"). Pt from home alone where he is independent with all ADLs, IADLs, ambulation without AD, and still driving. Pt currently requiring assistance for bed mobility and transfers with RW. He required anywhere from min to max A x1 with sit <> stands and he demo'd a significant posterior trunk lean which required a pt to have to sit and physical assistance to maintain balance during subsequent sit <> stands. Pt mod A for STP bed > chair with RW and required max cueing for proper technique. Pt currently functioning well below baseline and needs continued PT services. Pt states that his dtr could provide 24/7 assistance and even provide physical assistance at his home if needed. PT recommends SNF vs. HHPT with 24/7 assistance once ready for discharge, pending pt's acute progress. Will continue to follow acutely and update recommendations as needed.    Follow Up Recommendations SNF;Home health PT;Supervision/Assistance - 24 hour    Equipment Recommendations  Rolling walker with 5" wheels;None recommended by PT    Recommendations for Other Services       Precautions / Restrictions Precautions Precautions: Fall Precaution Comments: unsteady with transfers requiring assistnace for balance, generalized weakness; lines and leads      Mobility  Bed Mobility Overal bed mobility: Needs Assistance Bed Mobility: Supine to Sit     Supine to sit: Min assist      General bed mobility comments: upper body  Transfers Overall transfer level: Needs assistance Equipment used: Rolling walker (2 wheeled) Transfers: Sit to/from [facility] Sit to Stand: Min assist;Max assist Stand pivot transfers: Mod assist       General transfer comment: min to max A for sit <> stand from bed and from chair; initial increased posterior trunk lean which required pt to have to sit back down; verbal and min to max assist to correct posterior trunk lean; mod A for STP bed > chair max cues for proper technique  Ambulation/Gait                Stairs            Wheelchair Mobility    Modified Rankin (Stroke Patients Only)       Balance Overall balance assessment: Needs assistance Sitting-balance support: Feet supported Sitting balance-Leahy Scale: Good     Standing balance support: Bilateral upper extremity supported Standing balance-Leahy Scale: Fair Standing balance comment: initially poor standing balance as he demo'd increased posterior trunk lean; during 2nd sit > stand pt able to attain standing balance with verbal and tactile cueing not requiring physical assistance                             Pertinent Vitals/Pain Pain Assessment: No/denies pain    Home Living Family/patient expects to be discharged to:: Private residence Living Arrangements: Alone Available Help at Discharge: Available 24 hours/day;Friend(s) Type of Home: Mobile home Home Access: Stairs to  enter Entrance Stairs-Rails: Can reach both Entrance Stairs-Number of Steps: 3 Home Layout: One level Home Equipment: None      Prior Function           Comments: independent with ADLs, IADLs, ambulation without AD or assistance, still driving     Hand Dominance   Dominant Hand: Right    Extremity/Trunk Assessment   Upper Extremity Assessment Upper Extremity Assessment: Overall WFL for tasks assessed    Lower Extremity  Assessment Lower Extremity Assessment: Generalized weakness    Cervical / Trunk Assessment Cervical / Trunk Assessment: Normal  Communication   Communication: No difficulties  Cognition Arousal/Alertness: Awake/alert;Lethargic Behavior During Therapy: WFL for tasks assessed/performed Overall Cognitive Status: Within Functional Limits for tasks assessed                                 General Comments: did not know month or date but knew year; had to awaken pt upon entry and he was initially lethargic but once moving he was awake and alert      General Comments      Exercises     Assessment/Plan    PT Assessment Patient needs continued PT services  PT Problem List Decreased strength;Decreased activity tolerance;Decreased balance;Decreased mobility;Decreased knowledge of use of DME;Obesity;Pain       PT Treatment Interventions DME instruction;Gait training;Functional mobility training;Therapeutic activities;Therapeutic exercise;Balance training;Patient/family education    PT Goals (Current goals can be found in the Care Plan section)  Acute Rehab PT Goals Patient Stated Goal: home PT Goal Formulation: With patient Time For Goal Achievement: 08/29/16 Potential to Achieve Goals: Good    Frequency Min 3X/week   Barriers to discharge   pt lives alone but reports that his dtr could provide 24/7 assistance if needed and reporting that she could also provide physical assistance    Co-evaluation               AM-PAC PT "6 Clicks" Daily Activity  Outcome Measure Difficulty turning over in bed (including adjusting bedclothes, sheets and blankets)?: A Little Difficulty moving from lying on back to sitting on the side of the bed? : A Little Difficulty sitting down on and standing up from a chair with arms (e.g., wheelchair, bedside commode, etc,.)?: A Lot Help needed moving to and from a bed to chair (including a wheelchair)?: A Lot Help needed walking in  hospital room?: A Lot Help needed climbing 3-5 steps with a railing? : Total 6 Click Score: 13    End of Session Equipment Utilized During Treatment: Gait belt;Oxygen Activity Tolerance: Patient tolerated treatment well;No increased pain Patient left: in chair;with call bell/phone within reach;with chair alarm set Nurse Communication: Mobility status PT Visit Diagnosis: Unsteadiness on feet (R26.81);Muscle weakness (generalized) (M62.81);Difficulty in walking, not elsewhere classified (R26.2);Pain Pain - Right/Left: Left Pain - part of body: Ankle and joints of foot    Time: 1101-1139 PT Time Calculation (min) (ACUTE ONLY): 38 min   Charges:   PT Evaluation $PT Eval Low Complexity: 1 Low PT Treatments $Therapeutic Activity: 8-22 mins   PT G Codes:   PT G-Codes **NOT FOR INPATIENT CLASS** Functional Assessment Tool Used: AM-PAC 6 Clicks Basic Mobility;Clinical judgement Functional Limitation: Mobility: Walking and moving around Mobility: Walking and Moving Around Current Status (U4403): At least 60 percent but less than 80 percent impaired, limited or restricted Mobility: Walking and Moving Around Goal Status [phone]): At least 40 percent but  less than 60 percent impaired, limited or restricted     Geraldine Solar PT, DPT

## 2016-08-25 NOTE — Care Management Note (Signed)
Case Management Note  Patient Details  Name: [name] MRN: [id number] Date of Birth: 1931-01-20  If discussed at Long Length of Stay Meetings, dates discussed:  08/25/2016  Additional Comments:  Sacoya Mcgourty, Chauncey Reading, RN 08/25/2016, 12:11 PM

## 2016-08-25 NOTE — Progress Notes (Signed)
Inpatient Diabetes Program Recommendations  AACE/ADA: New Consensus Statement on Inpatient Glycemic Control (2015)  Target Ranges:  Prepandial:   less than 140 mg/dL      Peak postprandial:   less than 180 mg/dL (1-2 hours)      Critically ill patients:  140 - 180 mg/dL   Results for KERNEY, HOPFENSPERGER (MRN [id number]) as of 08/25/2016 09:32  Ref. Range 08/24/2016 08:24 08/24/2016 09:15 08/24/2016 11:14 08/24/2016 20:29 08/25/2016 04:59 08/25/2016 05:35 08/25/2016 07:47  Glucose-Capillary Latest Ref Range: 65 - 99 mg/dL 61 (L) 102 (H) 132 (H) 124 (H) 50 (L) 76 119 (H)   Review of Glycemic Control  Current orders for Inpatient glycemic control: Lantus 40 units QHS, Novolog 0-9 units TID with meals  Inpatient Diabetes Program Recommendations:  Insulin - Basal: Please consider decreasing Lantus to 34 units QHS (based on 97 kg x 0.35 units). Correction (SSI): Please consider ordering Novolog 0-5 units QHS.  Thanks, Barnie Alderman, RN, MSN, CDE Diabetes Coordinator Inpatient Diabetes Program [phone] (Team Pager from 8am to 5pm)

## 2016-08-25 NOTE — Progress Notes (Signed)
Patient output 700 ml today.  Lungs diminished, clear.  Bladder scan shows 1200 ml.  Dr. Anastasio Champion notified.  Dr. Anastasio Champion gave order to insert foley catheter for urinary retention.

## 2016-08-25 NOTE — Progress Notes (Signed)
Renal function improving patient continues on dual antibiotics for left foot cellulitis and insulin-dependent diabetes patient has acute on chronic renal insufficiency we'll check be met in a.Duncan. [name] ZTI:[id number] DOB: November 01, 1931 DOA: 08/20/2016 PCP: Phillip Gaskins, MD   Physical Exam: Blood pressure (!) 139/57, pulse 78, temperature 98.6 F (37 C), temperature source Oral, resp. rate 18, height _0  (1.803 Duncan), weight 97.5 kg (215 lb), SpO2 100 %. Lungs clear to A&P with diminished breath sounds in the bases heart irregular rhythm no S3 auscultated no heaves thrills or rubs.   Investigations:  Recent Results (from the past 240 hour(s))  Culture, blood (routine x 2)     Status: None   Collection Time: 08/20/16  3:51 PM  Result Value Ref Range Status   Specimen Description RIGHT ANTECUBITAL  Final   Special Requests   Final    BOTTLES DRAWN AEROBIC AND ANAEROBIC Blood Culture results may not be optimal due to an inadequate volume of blood received in culture bottles   Culture NO GROWTH 5 DAYS  Final   Report Status 08/25/2016 FINAL  Final  Culture, blood (routine x 2)     Status: None   Collection Time: 08/20/16  3:51 PM  Result Value Ref Range Status   Specimen Description BLOOD RIGHT HAND  Final   Special Requests   Final    BOTTLES DRAWN AEROBIC AND ANAEROBIC Blood Culture adequate volume   Culture NO GROWTH 5 DAYS  Final   Report Status 08/25/2016 FINAL  Final     Basic Metabolic Panel:  Recent Labs  08/24/16 0557 08/25/16 0553  NA 140 136  K 3.9 4.1  CL 103 104  CO2 24 23  GLUCOSE 54* 74  BUN 69* 70*  CREATININE 2.86* 2.42*  CALCIUM 9.7 9.2  PHOS  --  5.3*   Liver Function Tests:  Recent Labs  08/24/16 0557 08/25/16 0553  AST 21  --   ALT 13*  --   ALKPHOS 94  --   BILITOT 0.9  --   PROT 6.4*  --   ALBUMIN 2.8* 2.5*     CBC:  Recent Labs  08/23/16 0554  WBC 21.0*  HGB 11.4*  HCT 36.2*  MCV 88.9  PLT 192    US Renal  Result  Date: 08/24/2016 CLINICAL DATA:  Acute on chronic renal failure. History of BPH, hypertension, diabetes, coronary artery disease. EXAM: RENAL / URINARY TRACT ULTRASOUND COMPLETE COMPARISON:  CT scan of the chest and abdomen of June 10, 2016 FINDINGS: Right Kidney: Length: 13.8 cm. The renal cortical echotexture is approximately equal to that of the liver. There is no hydronephrosis nor cystic or solid mass. Left Kidney: Length: 12.8 cm. The renal cortical echotexture is similar to that on the right. There is no cystic or solid mass nor hydronephrosis. I cannot exclude a small amount of perinephric fluid on the left. Bladder: Appears normal for degree of bladder distention. IMPRESSION: Increased renal cortical echotexture consistent with medical renal disease. There is no hydronephrosis. There may be a small amount of perinephric fluid on the left. Normal appearance of the partially distended urinary bladder. Electronically Signed   By: David  Martinique Duncan.D.   On: 08/24/2016 09:48      Medications:   Impression:  Active Problems:   Cellulitis     Plan: Continue IV fluids monitor renal function improving continue dual antibiotics physical therapy for strengthening and ambulation out of bed to chair 3 times a day  Consultants: Nephrology and general surgery   Procedures   Antibiotics:         Time spent: 30 minutes   LOS: 5 days   [name]   08/25/2016, 1:06 PM

## 2016-08-25 NOTE — Progress Notes (Signed)
Dr. Anastasio Champion paged and made aware that pts oral temperature is 100.5 and pt is more confused than last night. New order for Tylenol 650mg  q6h PRN pain,fever. Will continue to monitor pt

## 2016-08-26 LAB — COMPLEMENT, TOTAL

## 2016-08-26 LAB — MPO/PR-3 (ANCA) ANTIBODIES
ANCA Proteinase 3: <3.5 U/mL (ref 0.0–3.5)
Myeloperoxidase Abs: <9 U/mL (ref 0.0–9.0)

## 2016-08-26 LAB — GLUCOSE, CAPILLARY
GLUCOSE-CAPILLARY: 167 mg/dL — AB (ref 65–99)
GLUCOSE-CAPILLARY: 133 mg/dL — AB (ref 65–99)
Glucose-Capillary: 154 mg/dL — ABNORMAL HIGH (ref 65–99)
Glucose-Capillary: 162 mg/dL — ABNORMAL HIGH (ref 65–99)
Glucose-Capillary: 165 mg/dL — ABNORMAL HIGH (ref 65–99)

## 2016-08-26 LAB — RENAL FUNCTION PANEL
ALBUMIN: 2.5 g/dL — AB (ref 3.5–5.0)
ANION GAP: 8 (ref 5–15)
BUN: 70 mg/dL — AB (ref 6–20)
CALCIUM: 9.1 mg/dL (ref 8.9–10.3)
CHLORIDE: 105 mmol/L (ref 101–111)
CO2: 22 mmol/L (ref 22–32)
CREATININE: 2.1 mg/dL — AB (ref 0.61–1.24)
GFR, EST AFRICAN AMERICAN: 32 mL/min — AB (ref >60)
GFR, EST NON AFRICAN AMERICAN: 27 mL/min — AB (ref >60)
Glucose, Bld: 160 mg/dL — ABNORMAL HIGH (ref 65–99)
Phosphorus: 5.2 mg/dL — ABNORMAL HIGH (ref 2.5–4.6)
Potassium: 4.5 mmol/L (ref 3.5–5.1)
Sodium: 135 mmol/L (ref 135–145)

## 2016-08-26 LAB — CBC
HEMATOCRIT: 31.6 % — AB (ref 39.0–52.0)
HEMOGLOBIN: 10 g/dL — AB (ref 13.0–17.0)
MCH: 27.5 pg (ref 26.0–34.0)
MCHC: 31.6 g/dL (ref 30.0–36.0)
MCV: 87.1 fL (ref 78.0–100.0)
Platelets: 204 10*3/uL (ref 150–400)
RBC: 3.63 MIL/uL — AB (ref 4.22–5.81)
RDW: 14.1 % (ref 11.5–15.5)
WBC: 16.4 10*3/uL — AB (ref 4.0–10.5)

## 2016-08-26 LAB — PROTEIN, URINE, 24 HOUR
COLLECTION INTERVAL-UPROT: 24 h
Protein, 24H Urine: 828 mg/d — ABNORMAL HIGH (ref 50–100)
Protein, Urine: 92 mg/dL
Urine Total Volume-UPROT: 900 mL

## 2016-08-26 MED ORDER — APIXABAN 2.5 MG PO TABS
2.5000 mg | ORAL_TABLET | Freq: Two times a day (BID) | ORAL | Status: DC
  Administered 2016-08-26 – 2016-08-27 (×3): 2.5 mg via ORAL
  Filled 2016-08-26 (×3): qty 1

## 2016-08-26 NOTE — Plan of Care (Signed)
Problem: Safety: Goal: Ability to remain free from injury will improve Outcome: Progressing Pt high fall risk d/t medications/confusion. Pts bed in lowest locked position, SR up x3, call bell within reach, fall mat on floor. Pt blood risk d/t taking Eliquis. Confused more tonight and can not tell RN where he is. Pt resting quietly in bed after taking medications. Pt educated on safety precautions and reasoning for interventions. Pt verbalized understanding. Will continue to monitor pt

## 2016-08-26 NOTE — Progress Notes (Signed)
Patient continues with IV antibiotics is significant tenderness on plantar aspect of foot undergoing physical therapy difficulty with ambulation and weightbearing. Renal function improving creatinine 2.10 Lantus. Discharge within 24 hours with physical therapy and home health nursing care as well as oral antibiotic [name] LPF:[id number] DOB: 03/16/1931 DOA: 08/20/2016 PCP: Phillip Gaskins, MD   Physical Exam: Blood pressure (!) 131/52, pulse 87, temperature 99.2 F (37.3 C), temperature source Oral, resp. rate 18, height 5\' 11"  (1.803 m), weight 97.5 kg (215 lb), SpO2 92 %. Lungs clear to A&P no rales wheeze rhonchi heart regular rhythm no murmurs goes heaves feels rubs   Investigations:  Recent Results (from the past 240 hour(s))  Culture, blood (routine x 2)     Status: None   Collection Time: 08/20/16  3:51 PM  Result Value Ref Range Status   Specimen Description RIGHT ANTECUBITAL  Final   Special Requests   Final    BOTTLES DRAWN AEROBIC AND ANAEROBIC Blood Culture results may not be optimal due to an inadequate volume of blood received in culture bottles   Culture NO GROWTH 5 DAYS  Final   Report Status 08/25/2016 FINAL  Final  Culture, blood (routine x 2)     Status: None   Collection Time: 08/20/16  3:51 PM  Result Value Ref Range Status   Specimen Description BLOOD RIGHT HAND  Final   Special Requests   Final    BOTTLES DRAWN AEROBIC AND ANAEROBIC Blood Culture adequate volume   Culture NO GROWTH 5 DAYS  Final   Report Status 08/25/2016 FINAL  Final     Basic Metabolic Panel:  Recent Labs  08/25/16 0553 08/26/16 0526  NA 136 135  K 4.1 4.5  CL 104 105  CO2 23 22  GLUCOSE 74 160*  BUN 70* 70*  CREATININE 2.42* 2.10*  CALCIUM 9.2 9.1  PHOS 5.3* 5.2*   Liver Function Tests:  Recent Labs  08/24/16 0557 08/25/16 0553 08/26/16 0526  AST 21  --   --   ALT 13*  --   --   ALKPHOS 94  --   --   BILITOT 0.9  --   --   PROT 6.4*  --   --   ALBUMIN 2.8*  2.5* 2.5*     CBC:  Recent Labs  08/26/16 0526  WBC 16.4*  HGB 10.0*  HCT 31.6*  MCV 87.1  PLT 204    No results found.    Medications:   Impression:  Active Problems:   Cellulitis     Plan: IV meropenem 500 every 12 for 24 more hours discharge tomorrow with home health care physical therapy and oral antibiotics  Consultants: Surgery and Nephrology   Procedures   Antibiotics: Meropenem day 5      Time spent: 30 minutes   LOS: 6 days   [name] M   08/26/2016, 1:20 PM

## 2016-08-26 NOTE — Progress Notes (Signed)
[name]  MRN: [id number]  DOB/AGE: 1931/01/23 83 y.o.  Primary Care Physician:Dondiego, Delfino Lovett, MD  Admit date: 08/20/2016  Chief Complaint:  Chief Complaint  Patient presents with  . Foreign Body    S-Pt presented on  08/20/2016 with  Chief Complaint  Patient presents with  . Foreign Body  .    Pt today feels better.  Meds . amLODipine  5 mg Oral Daily   And  . irbesartan  300 mg Oral Daily  . apixaban  2.5 mg Oral BID  . cholecalciferol  5,000 Units Oral Daily  . cloNIDine  0.1 mg Oral BID  . dextrose  50 mL Intravenous Once  . docusate sodium  100 mg Oral BID  . insulin aspart  0-9 Units Subcutaneous TID WC  . insulin glargine  40 Units Subcutaneous QHS  . ipratropium-albuterol  3 mL Nebulization Q6H  . LORazepam  1 mg Oral QHS  . metoprolol succinate  75 mg Oral Daily  . pantoprazole  40 mg Oral Daily  . pregabalin  50 mg Oral Daily  . rosuvastatin  10 mg Oral q1800  . tamsulosin  0.4 mg Oral QPC supper  . timolol  1 drop Left Eye Daily      Physical Exam: Vital signs in last 24 hours: Temp:  [97.6 F (36.4 C)-100.5 F (38.1 C)] 99.2 F (37.3 C) (08/22 0426) Pulse Rate:  [78-113] 87 (08/22 0426) Resp:  [18-20] 18 (08/22 0426) BP: (131-154)/(51-69) 131/52 (08/22 0426) SpO2:  [92 %-100 %] 92 % (08/22 0813) Weight change:  Last BM Date: 08/20/16  Intake/Output from previous day: 08/21 0701 - 08/22 0700 In: 3336.7 [P.O.:720; I.V.:2516.7; IV Piggyback:100] Out: 1550 [Urine:1550] No intake/output data recorded.   Physical Exam: General- pt is awake,alert, oriented to time place and person Resp- No acute REsp distress,  Rhonchi+  CVS- S1S2 regular in rate and rhythm GIT- BS+, soft, NT, ND EXT- NO LE Edema, NO Cyanosis          Left feet wound, No discharge   Lab Results: CBC  Recent Labs  08/26/16 0526  WBC 16.4*  HGB 10.0*  HCT 31.6*  PLT 204    BMET  Recent Labs  08/25/16 0553 08/26/16 0526  NA 136 135  K 4.1 4.5  CL 104  105  CO2 23 22  GLUCOSE 74 160*  BUN 70* 70*  CREATININE 2.42* 2.10*  CALCIUM 9.2 9.1    Creat trend 2018 1.9==> 3.2==> 2.42=>2.1 July admission 1.5--2.2 June admission    1.5--2.3  March admission 1.5--1.7 2017 1.5 2015 1.4 2014  1.9   MICRO Recent Results (from the past 240 hour(s))  Culture, blood (routine x 2)     Status: None   Collection Time: 08/20/16  3:51 PM  Result Value Ref Range Status   Specimen Description RIGHT ANTECUBITAL  Final   Special Requests   Final    BOTTLES DRAWN AEROBIC AND ANAEROBIC Blood Culture results may not be optimal due to an inadequate volume of blood received in culture bottles   Culture NO GROWTH 5 DAYS  Final   Report Status 08/25/2016 FINAL  Final  Culture, blood (routine x 2)     Status: None   Collection Time: 08/20/16  3:51 PM  Result Value Ref Range Status   Specimen Description BLOOD RIGHT HAND  Final   Special Requests   Final    BOTTLES DRAWN AEROBIC AND ANAEROBIC Blood Culture adequate volume   Culture NO GROWTH  5 DAYS  Final   Report Status 08/25/2016 FINAL  Final      Lab Results  Component Value Date   CALCIUM 9.1 08/26/2016   CAION 1.12 02/01/2009   PHOS 5.2 (H) 08/26/2016      Renal u.s Right Kidney: 13.8 cm.   There is no hydronephrosis nor cystic orsolid mass. Left Kidney: 12.8 cm.There is no cystic or solid mass nor hydronephrosis.        Impression: 1)Renal  AKI secondary to ATN                AKI on CKD               CKD stage 3.               CKD since 2015               CKD secondary to DM/HTN                Progression of CKD marked with multilpe AKI                Proteinura Absent .                 AKi better                Creat trending down                2)HTN  Medication- On RAS blockers. On Calcium Channel Blockers On Beta blockers On Central Acting Sympatholytics.   3)Anemia HGb at goal (9--11)   4)CKD Mineral-Bone Disorder PTH not avail. Secondary  Hyperparathyroidism w/u pending. Phosphorus above the goal.   wil wait for starting binders as this is most likely sec to aki   5)Resp-hx of COPD PMD following  6)Electrolytes Normokalemic NOrmonatremic   7)Acid base Co2 at goal  8)ID-admitted with cellulitis   Primary MD follwoing   Plan:  AKI better Will continue current care      [name] 08/26/2016, 9:24 AM

## 2016-08-26 NOTE — Progress Notes (Addendum)
Physical Therapy Treatment Patient Details Name: [name] MRN: [id number] DOB: July 16, 1931 Today's Date: 08/26/2016    History of Present Illness [name] is a 83 y.o. male with medical history significant of dCHF, COPD, CKD 3, HTN, CAD, DM2, HLD, BPH presents from the office of Dr. Lorriane Shire for fevers and chills and low BP.    PT Comments    Patient Mod assist to scoot to bed side secondary generalized weakness, poor return for weight shifting side to side, limited for standing with FWW due to BLE weakness, Max assist with FWW x 4 side steps demonstrating poor balance/tolerance for standing and required Mod assist to reposition when put back to bed.  Follow Up Recommendations  SNF;Supervision/Assistance - 24 hour     Equipment Recommendations  Front wheeled walker with 5" wheels for safe ambulation   Recommendations for Other Services       Precautions / Restrictions Precautions Precautions: Fall Precaution Comments: very unsteady on feet Restrictions Weight Bearing Restrictions: No    Mobility  Bed Mobility Overal bed mobility: Needs Assistance Bed Mobility: Supine to Sit     Supine to sit: Mod assist     General bed mobility comments: difficulty scooting forward secondary to c/o groin discomfort and generalized weakness  Transfers Overall transfer level: Needs assistance Equipment used: Rolling walker (2 wheeled) Transfers: Sit to/from Stand Sit to Stand: Mod assist         General transfer comment: Patient very unsteady and slightly SOB when standing  Ambulation/Gait Ambulation/Gait assistance: Max assist Ambulation Distance (Feet): 4 Feet Assistive device: Rolling walker (2 wheeled) Gait Pattern/deviations: Decreased step length - right;Decreased step length - left;Decreased stance time - right;Decreased stance time - left;Decreased stride length   Gait velocity interpretation: Below normal speed for age/gender General Gait Details: Patient  limited to a few side steps due to poor balance and slightl difficulty breathing   Stairs            Wheelchair Mobility    Modified Rankin (Stroke Patients Only)       Balance Overall balance assessment: Needs assistance Sitting-balance support: Feet supported Sitting balance-Leahy Scale: Fair     Standing balance support: Bilateral upper extremity supported Standing balance-Leahy Scale: Poor Standing balance comment: Frequent posterior leaning and buckling of knees                            Cognition Arousal/Alertness: Awake/alert Behavior During Therapy: WFL for tasks assessed/performed Overall Cognitive Status: Within Functional Limits for tasks assessed                                        Exercises General Exercises - Lower Extremity Long Arc Quad: Seated;Both;10 reps Hip Flexion/Marching: Both;Seated;10 reps Toe Raises: Both;Seated;10 reps    General Comments        Pertinent Vitals/Pain Pain Assessment: No/denies pain    Home Living                      Prior Function            PT Goals (current goals can now be found in the care plan section) Acute Rehab PT Goals Patient Stated Goal: return home PT Goal Formulation: With patient Time For Goal Achievement: 09/04/16 Progress towards PT goals: Progressing toward goals    Frequency  Min 3X/week      PT Plan Current plan remains appropriate    Co-evaluation              AM-PAC PT "6 Clicks" Daily Activity  Outcome Measure  Difficulty turning over in bed (including adjusting bedclothes, sheets and blankets)?: Unable Difficulty moving from lying on back to sitting on the side of the bed? : Unable Difficulty sitting down on and standing up from a chair with arms (e.g., wheelchair, bedside commode, etc,.)?: Unable Help needed moving to and from a bed to chair (including a wheelchair)?: A Lot Help needed walking in hospital room?: A  Lot Help needed climbing 3-5 steps with a railing? : Total 6 Click Score: 8    End of Session Equipment Utilized During Treatment: Gait belt Activity Tolerance: Patient limited by fatigue Patient left: in bed;with call bell/phone within reach;with family/visitor present Nurse Communication: Mobility status PT Visit Diagnosis: Unsteadiness on feet (R26.81);Other abnormalities of gait and mobility (R26.89);Muscle weakness (generalized) (M62.81)     Time: 1601-1630 PT Time Calculation (min) (ACUTE ONLY): 29 min  Charges:  $Therapeutic Activity: 23-37 mins                    G Codes:       4:35 PM, September 04, 2016 Lonell Grandchild, MPT Physical Therapist with [facility] [phone] office [phone] mobile phone

## 2016-08-26 NOTE — Care Management Note (Signed)
Case Management Note  Patient Details  Name: [name] MRN: [id number] Date of Birth: December 26, 1931  Subjective/Objective:    Adm with cellulitis. From home, was ind PTA. Will need HH at minimal at DC. Recommendation SNF vs HH vs supervision. Patient refuses SNF but agreeable to Home health. He reports his daughter lives a few miles away and friends that check on him daily (multiple times if needed). Offered choice of [place] agencies. Patient has no preference. CM will refer to [facility]. He will also need RW.            Action/Plan: Anticipate DC home with home health. RW will be ordered and delivered to room prior to DC.    Expected Discharge Date:  08/22/16               Expected Discharge Plan:  [place]  In-House Referral:     Discharge planning Services  CM Consult  Post Acute Care Choice:  NA, Durable Medical Equipment, Home Health Choice offered to:  Patient  DME Arranged:  Walker rolling DME Agency:  [place] Arranged:  RN, PT [facility] Agency:  [place]  Status of Service:  Completed, signed off  If discussed at [place] of Stay Meetings, dates discussed:    Additional Comments:  Glenora Morocho, Chauncey Reading, RN 08/26/2016, 10:53 AM

## 2016-08-27 ENCOUNTER — Inpatient Hospital Stay (HOSPITAL_COMMUNITY): Payer: Medicare Other

## 2016-08-27 LAB — UIFE/LIGHT CHAINS/TP QN, 24-HR UR
% BETA, Urine: 13.8 %
ALBUMIN, U: 47.2 %
ALPHA 1 URINE: 2.4 %
Alpha 2, Urine: 13.3 %
FREE KAPPA/LAMBDA RATIO: 9.43 (ref 2.04–10.37)
FREE LAMBDA LT CHAINS, UR: 22.9 mg/L — AB (ref 0.24–6.66)
Free Lt Chn Excr Rate: 216 mg/L — ABNORMAL HIGH (ref 1.35–24.19)
GAMMA GLOBULIN URINE: 23.2 %
TIME-UPE24: 24 h
TOTAL PROTEIN, URINE-UR/DAY: 566 mg/(24.h) — AB (ref 30–150)
Total Protein, Urine: 62.9 mg/dL
Total Volume: 900

## 2016-08-27 LAB — GLUCOSE, CAPILLARY
GLUCOSE-CAPILLARY: 116 mg/dL — AB (ref 65–99)
GLUCOSE-CAPILLARY: 152 mg/dL — AB (ref 65–99)

## 2016-08-27 MED ORDER — CLINDAMYCIN HCL 150 MG PO CAPS
300.0000 mg | ORAL_CAPSULE | Freq: Three times a day (TID) | ORAL | Status: DC
  Administered 2016-08-27: 300 mg via ORAL
  Filled 2016-08-27: qty 2

## 2016-08-27 MED ORDER — CLINDAMYCIN HCL 300 MG PO CAPS: 300.0000 mg | ORAL_CAPSULE | Freq: Three times a day (TID) | ORAL | 1 refills | Status: DC

## 2016-08-27 NOTE — Care Management Important Message (Signed)
Important Message  Patient Details  Name: FARZAD TIBBETTS MRN: [id number] Date of Birth: 29-Jul-1931   Medicare Important Message Given:  Yes    Araya Roel, Chauncey Reading, RN 08/27/2016, 9:02 AM

## 2016-08-27 NOTE — Care Management Note (Signed)
Case Management Note  Patient Details  Name: [name] MRN: [id number] Date of Birth: [birth year]  If discussed at Long Length of Stay Meetings, dates discussed:   08/27/2016 Additional Comments:  Richardine Peppers, Chauncey Reading, RN 08/27/2016, 12:07 PM

## 2016-08-27 NOTE — Progress Notes (Signed)
SATURATION QUALIFICATIONS: (This note is used to comply with regulatory documentation for home oxygen) ° °Patient Saturations on Room Air at Rest = 93% ° °Patient Saturations on Room Air while Ambulating = 87% ° °Patient Saturations on 2 Liters of oxygen while Ambulating = 92% ° °Please briefly explain why patient needs home oxygen:  °

## 2016-08-27 NOTE — Progress Notes (Signed)
Physical Therapy Treatment Patient Details Name: [name] MRN: [id number] DOB: 12/19/1931 Today's Date: 08/27/2016    History of Present Illness [name] is a 83 y.o. male with medical history significant of dCHF, COPD, CKD 3, HTN, CAD, DM2, HLD, BPH presents from the office of Dr. Lorriane Shire for fevers and chills and low BP.    PT Comments    Pt is up to side of bed after reapplying O2 on the bed.  He was down to 87% with no O2, then at 93% finally supine.  Pulses were high and at 113 resting initially, down to 108 to get up then 148 sitting.  After getting to chair was 129 but had O2 at 93%.  Pt is comfortable, talked with him about rehab after hosp but per case manager he is still expecting to go home.  Pt is too weak for this option, would need 24/7 care and would be a high fall risk.  Continue acute therapy as needed for strength and endurance work as his system tolerates.   Follow Up Recommendations  SNF     Equipment Recommendations  None recommended by PT    Recommendations for Other Services       Precautions / Restrictions Precautions Precautions: Fall (O2, cath) Precaution Comments: planning home but needs SNF Restrictions Weight Bearing Restrictions: No    Mobility  Bed Mobility Overal bed mobility: Needs Assistance Bed Mobility: Supine to Sit     Supine to sit: Mod assist     General bed mobility comments: supported trunk and had pt assist with hips but finally helped with bed pad  Transfers Overall transfer level: Needs assistance Equipment used: Rolling walker (2 wheeled) Transfers: Sit to/from Stand Sit to Stand: Max assist Stand pivot transfers: Min assist       General transfer comment: stood with help to power up and needed much less help to sidestep  Ambulation/Gait             General Gait Details: deferred due to plse 148 sitting pre-power up   Stairs            Wheelchair Mobility    Modified Rankin (Stroke  Patients Only)       Balance Overall balance assessment: Needs assistance Sitting-balance support: Feet supported Sitting balance-Leahy Scale: Fair     Standing balance support: Bilateral upper extremity supported Standing balance-Leahy Scale: Poor Standing balance comment: cues for hand placement and to plan his path to chair                            Cognition Arousal/Alertness: Awake/alert Behavior During Therapy: WFL for tasks assessed/performed Overall Cognitive Status: Within Functional Limits for tasks assessed                                 General Comments: pt is a bit anxious and wheezing with exertion      Exercises General Exercises - Lower Extremity Ankle Circles/Pumps: AROM;Both;5 reps Quad Sets: AROM;Both;10 reps Gluteal Sets: AROM;Both;10 reps Heel Slides: AROM;Both;10 reps Hip ABduction/ADduction: AROM;5 reps;10 reps    General Comments        Pertinent Vitals/Pain Pain Assessment: No/denies pain    Home Living                      Prior Function  PT Goals (current goals can now be found in the care plan section) Acute Rehab PT Goals Patient Stated Goal: return home Progress towards PT goals: Progressing toward goals    Frequency    Min 3X/week      PT Plan Current plan remains appropriate    Co-evaluation              AM-PAC PT "6 Clicks" Daily Activity  Outcome Measure  Difficulty turning over in bed (including adjusting bedclothes, sheets and blankets)?: Unable Difficulty moving from lying on back to sitting on the side of the bed? : Unable Difficulty sitting down on and standing up from a chair with arms (e.g., wheelchair, bedside commode, etc,.)?: Unable Help needed moving to and from a bed to chair (including a wheelchair)?: A Lot Help needed walking in hospital room?: A Little Help needed climbing 3-5 steps with a railing? : Total 6 Click Score: 9    End of Session  Equipment Utilized During Treatment: Oxygen Activity Tolerance: Patient limited by fatigue;Treatment limited secondary to medical complications (Comment) (O2 sats dropped with no O2, pulse elevated with all mobility) Patient left: in chair;with call bell/phone within reach;with chair alarm set;with family/visitor present Nurse Communication: Mobility status PT Visit Diagnosis: Unsteadiness on feet (R26.81);Other abnormalities of gait and mobility (R26.89);Muscle weakness (generalized) (M62.81)     Time: 4888-9169 PT Time Calculation (min) (ACUTE ONLY): 25 min  Charges:  $Therapeutic Exercise: 8-22 mins $Therapeutic Activity: 8-22 mins                    G Codes:  Functional Assessment Tool Used: AM-PAC 6 Clicks Basic Mobility    Ramond Dial 08/27/2016, 10:10 AM   10:13 AM, 08/27/16 Mee Hives, PT, MS Physical Therapist - [place] [phone] (Office)

## 2016-08-27 NOTE — Discharge Summary (Signed)
Physician Discharge Summary  [name] ZTI:[id number] DOB: [birth year] DOA: 08/20/2016  PCP: [name], MD  Admit date: 08/20/2016 Discharge date: 08/27/2016   Recommendations for Outpatient Follow-up:  Patient is advised to take clindamycin 300 mg by mouth 3 times a day for 10 consecutive days. To use 2 L nasal O2 1 sleeping and during the day during activity. Follow-up with physical therapy at home health care RN for strengthening and ambulation and to take all of his previous medications prior to hospitalization as directed the only change is the clindamycin 300 mg 3 times a day for 10 days Discharge Diagnoses:  Active Problems:   Cellulitis   Discharge Condition: Good  Filed Weights   08/20/16 0926 08/20/16 1851  Weight: 97.5 kg (215 lb) 97.5 kg (215 lb)    History of present illness:  The patient is an 83 year old white male with multiple medical issues including hypertension coronary artery disease status post CABG hypertension hyperlipidemia who had a puncture wound left foot was admitted with sepsis hypotension he likewise has insulin-dependent diabetes placed on dual antibiotics upon admission his sepsis resolved over 2 to three-day period of intravenous antibiotics were continued for 7 consecutive days during this time he had acute on chronic renal failure seen in consultation by nephrology his creatinine trended downwards towards baseline of 2.1 on the day prior to discharge. It was felt it was safe for the patient to go home on clindamycin 3 mg by mouth 3 times a day for 10 consecutive days with home health nurse nasal O2 as well as physical therapy the patient emphatically refused despite risks to considered skilled nursing facilities this was explained to them but nursing by social services and myself in the presence of witnesses. He'll follow-up in my office within one week's time to assess respiratory status healing of wound of foot and hemodynamics as well as  glycemic control and renal failure  Hospital Course:  See history of present illness above Procedures:    Consultations:  Nephrology  Discharge Instructions  Discharge Instructions    Discharge instructions    Complete by:  As directed    Discharge patient    Complete by:  As directed    Discharge disposition:  01-Home or Self Care   Discharge patient date:  08/27/2016   Face-to-face encounter (required for Medicare/Medicaid patients)    Complete by:  As directed    I Gearl Kimbrough M certify that this patient is under my care and that I, or a nurse practitioner or physician's assistant working with me, had a face-to-face encounter that meets the physician face-to-face encounter requirements with this patient on 08/27/2016. The encounter with the patient was in whole, or in part for the following medical condition(s) which is the primary reason for home health care (List medical condition): cellultis, IDDM, A/CRF, CAD, HTN   The encounter with the patient was in whole, or in part, for the following medical condition, which is the primary reason for home health care:  cellulitis, IDDM, CHF, CAD, A/CRF,   I certify that, based on my findings, the following services are medically necessary home health services:   Nursing Physical therapy     Reason for Medically Necessary Home Health Services:  Skilled Nursing- Change/Decline in Patient Status   My clinical findings support the need for the above services:  Can transfer bed to chair only   Further, I certify that my clinical findings support that this patient is homebound due to:  Shortness  of Breath with activity   For home use only DME oxygen    Complete by:  As directed    2 liters nasal O2 H.S. And upon exertion   Mode or (Route):  Nasal cannula   Oxygen delivery system:  Gas   Home Health    Complete by:  As directed    To provide the following care/treatments:   PT RN Social work       Allergies as of 08/27/2016   No  Known Allergies     Medication List    STOP taking these medications   pregabalin 50 MG capsule Commonly known as:  LYRICA     TAKE these medications   AZOR 5-40 MG tablet Generic drug:  amLODipine-olmesartan Take 1 tablet by mouth daily.   cholecalciferol 1000 units tablet Commonly known as:  VITAMIN D Take 5,000 Units by mouth daily.   clindamycin 300 MG capsule Commonly known as:  CLEOCIN Take 1 capsule (300 mg total) by mouth every 8 (eight) hours.   cloNIDine 0.1 MG tablet Commonly known as:  CATAPRES Take 0.1 mg by mouth 2 (two) times daily.   docusate sodium 100 MG capsule Commonly known as:  COLACE Take 100 mg by mouth 2 (two) times daily.   ELIQUIS 5 MG Tabs tablet Generic drug:  apixaban Take 5 mg by mouth 2 (two) times daily.   furosemide 20 MG tablet Commonly known as:  LASIX TAKE 20 MG IN THE AM AND ALTERNATE 20 MG IN THE EVENING   insulin glargine 100 UNIT/ML injection Commonly known as:  LANTUS Inject 0.5 mLs (50 Units total) into the skin at bedtime.   ipratropium-albuterol 0.5-2.5 (3) MG/3ML Soln Commonly known as:  DUONEB Take 3 mLs by nebulization every 6 (six) hours as needed (wheezing).   metoprolol succinate 25 MG 24 hr tablet Commonly known as:  TOPROL-XL Take 3 tablets (75 mg total) by mouth daily. Take with or immediately following a meal.   nitroGLYCERIN 0.4 MG SL tablet Commonly known as:  NITROSTAT Place 1 tablet (0.4 mg total) under the tongue every 5 (five) minutes as needed for chest pain.   oxyCODONE 15 MG immediate release tablet Commonly known as:  ROXICODONE Take 30 mg by mouth every 8 (eight) hours as needed for pain.   pantoprazole 40 MG tablet Commonly known as:  PROTONIX Take 40 mg by mouth daily.   rosuvastatin 10 MG tablet Commonly known as:  CRESTOR Take 10 mg by mouth daily.   silodosin 8 MG Caps capsule Commonly known as:  RAPAFLO Take 8 mg by mouth daily with breakfast.   timolol 0.5 % ophthalmic  solution Commonly known as:  BETIMOL Place 1 drop into the left eye daily.            Durable Medical Equipment        Start     Ordered   08/27/16 1244  For home use only DME oxygen  Once    Question Answer Comment  Mode or (Route) Nasal cannula   Liters per Minute 2   Frequency Continuous (stationary and portable oxygen unit needed)   Oxygen conserving device Yes   Oxygen delivery system Gas      08/27/16 1244   08/27/16 0000  For home use only DME oxygen    Comments:  2 liters nasal O2 H.S. And upon exertion  Question Answer Comment  Mode or (Route) Nasal cannula   Oxygen delivery system Gas  08/27/16 1306   08/26/16 1110  For home use only DME Walker rolling  Once    Comments:  Cellulitis in foot/gait instability/deconditioning  Question:  Patient needs a walker to treat with the following condition  Answer:  Weakness   08/26/16 1110       Discharge Care Instructions        Start     Ordered   08/27/16 0000  clindamycin (CLEOCIN) 300 MG capsule  Every 8 hours     08/27/16 1306   08/27/16 0000  For home use only DME oxygen    Comments:  2 liters nasal O2 H.S. And upon exertion  Question Answer Comment  Mode or (Route) Nasal cannula   Oxygen delivery system Gas      08/27/16 1306   08/27/16 0000  Discharge instructions     08/27/16 1306   08/27/16 0000  Discharge patient    Question Answer Comment  Discharge disposition 01-Home or Self Care   Discharge patient date 08/27/2016      08/27/16 1306   08/27/16 [place]  (Home health needs / face to face )    Question Answer Comment  To provide the following care/treatments PT   To provide the following care/treatments RN   To provide the following care/treatments Social work      08/27/16 1306   08/27/16 0000  Face-to-face encounter (required for Medicare/Medicaid patients)  (Home health needs / face to face )    Comments:  I Cadence Haslam M certify that this patient is under my care and  that I, or a nurse practitioner or physician's assistant working with me, had a face-to-face encounter that meets the physician face-to-face encounter requirements with this patient on 08/27/2016. The encounter with the patient was in whole, or in part for the following medical condition(s) which is the primary reason for home health care (List medical condition): cellultis, IDDM, A/CRF, CAD, HTN  Question Answer Comment  The encounter with the patient was in whole, or in part, for the following medical condition, which is the primary reason for home health care cellulitis, IDDM, CHF, CAD, A/CRF,   I certify that, based on my findings, the following services are medically necessary home health services Nursing   I certify that, based on my findings, the following services are medically necessary home health services Physical therapy   Reason for Medically [place]- Change/Decline in Patient Status   My clinical findings support the need for the above services Can transfer bed to chair only   Further, I certify that my clinical findings support that this patient is homebound due to: Shortness of Breath with activity      08/27/16 1306     No Known Allergies    The results of significant diagnostics from this hospitalization (including imaging, microbiology, ancillary and laboratory) are listed below for reference.    Significant Diagnostic Studies: Dg Chest 2 View  Result Date: 08/27/2016 CLINICAL DATA:  Shortness of breath, hypertension, history of coronary artery disease and previous COPD. Remote history of smoking. EXAM: CHEST  2 VIEW COMPARISON:  PA and lateral chest x-ray of August 20, 2016 FINDINGS: Today's study is obtained in a lordotic fashion. The lungs are reasonably well inflated. The interstitial markings remain increased. The cardiac silhouette remains enlarged. The pulmonary vascularity is slightly less engorged. There is calcification in the  wall of the aortic arch. The sternal wires are intact. A small amount of pleural  fluid is likely present on the right. IMPRESSION: CHF with mild interstitial edema stable to slightly improved since the previous study. Previous CABG.  Thoracic aortic atherosclerosis. Electronically Signed   By: David  Martinique M.D.   On: 08/27/2016 11:31   Dg Chest 2 View  Result Date: 08/20/2016 CLINICAL DATA:  Chills. EXAM: CHEST  2 VIEW COMPARISON:  07/24/2016 FINDINGS: Prior CABG. Cardiomegaly with vascular congestion and diffuse interstitial/alveolar opacities, likely edema/ CHF. No effusions or acute bony abnormality. IMPRESSION: Mild CHF. Electronically Signed   By: Rolm Baptise M.D.   On: 08/20/2016 10:57   US Renal  Result Date: 08/24/2016 CLINICAL DATA:  Acute on chronic renal failure. History of BPH, hypertension, diabetes, coronary artery disease. EXAM: RENAL / URINARY TRACT ULTRASOUND COMPLETE COMPARISON:  CT scan of the chest and abdomen of June 10, 2016 FINDINGS: Right Kidney: Length: 13.8 cm. The renal cortical echotexture is approximately equal to that of the liver. There is no hydronephrosis nor cystic or solid mass. Left Kidney: Length: 12.8 cm. The renal cortical echotexture is similar to that on the right. There is no cystic or solid mass nor hydronephrosis. I cannot exclude a small amount of perinephric fluid on the left. Bladder: Appears normal for degree of bladder distention. IMPRESSION: Increased renal cortical echotexture consistent with medical renal disease. There is no hydronephrosis. There may be a small amount of perinephric fluid on the left. Normal appearance of the partially distended urinary bladder. Electronically Signed   By: David  Martinique M.D.   On: 08/24/2016 09:48   Mr Foot Left Wo Contrast  Result Date: 08/20/2016 CLINICAL DATA:  Diabetic patient suffered a puncture wound on the plantar surface of the left foot 1 day ago. Fever and chills. EXAM: MRI OF THE LEFT FOOT WITHOUT CONTRAST  TECHNIQUE: Multiplanar, multisequence MR imaging of the left foot was performed. No intravenous contrast was administered. COMPARISON:  Plain films left foot earlier today. FINDINGS: Bones/Joint/Cartilage There is no bone marrow signal abnormality to suggest osteomyelitis. No joint effusion is seen. There is no fracture or stress change. Ligaments Intact. Muscles and Tendons Intact. Soft tissues No fluid collection. Small focus of susceptibility artifact is seen on the plantar surface of the foot in the cutaneous tissues just lateral to the third MTP joint. IMPRESSION: Negative for abscess, osteomyelitis or septic joint. Small focus of susceptibility artifact in the cutaneous tissues on the plantar surface of the foot just medial to the third MTP joint is consistent with the presence of a metallic foreign body which could be microscopic. Note is made that no foreign body is seen on the prior plain films. Electronically Signed   By: Inge Rise M.D.   On: 08/20/2016 15:58   Dg Foot Complete Left  Result Date: 08/20/2016 CLINICAL DATA:  Removal of foreign body.  Chills and pain. EXAM: LEFT FOOT - COMPLETE 3+ VIEW COMPARISON:  No recent . FINDINGS: Mild diffuse soft tissue swelling. Peripheral vascular calcification. No radiopaque foreign body. Diffuse degenerative change. No acute bony abnormality identified. No evidence of fracture or dislocation. If osteomyelitis is of concern MRI can be obtained IMPRESSION: 1. Mild diffuse soft tissue swelling. Peripheral vascular disease. No radiopaque foreign body. 2. No acute or focal bony abnormality identified. Diffuse degenerative disease. Electronically Signed   By: Marcello Moores  Register   On: 08/20/2016 10:12    Microbiology: Recent Results (from the past 240 hour(s))  Culture, blood (routine x 2)     Status: None   Collection Time: 08/20/16  3:51 PM  Result Value Ref Range Status   Specimen Description RIGHT ANTECUBITAL  Final   Special Requests   Final     BOTTLES DRAWN AEROBIC AND ANAEROBIC Blood Culture results may not be optimal due to an inadequate volume of blood received in culture bottles   Culture NO GROWTH 5 DAYS  Final   Report Status 08/25/2016 FINAL  Final  Culture, blood (routine x 2)     Status: None   Collection Time: 08/20/16  3:51 PM  Result Value Ref Range Status   Specimen Description BLOOD RIGHT HAND  Final   Special Requests   Final    BOTTLES DRAWN AEROBIC AND ANAEROBIC Blood Culture adequate volume   Culture NO GROWTH 5 DAYS  Final   Report Status 08/25/2016 FINAL  Final     Labs: Basic Metabolic Panel:  Recent Labs Lab 08/21/16 0553 08/23/16 0554 08/24/16 0557 08/25/16 0553 08/26/16 0526  NA 139 137 140 136 135  K 3.9 4.3 3.9 4.1 4.5  CL 104 102 103 104 105  CO2 25 24 24 23 22   GLUCOSE 79 135* 54* 74 160*  BUN 41* 62* 69* 70* 70*  CREATININE 1.92* 3.20* 2.86* 2.42* 2.10*  CALCIUM 9.5 9.6 9.7 9.2 9.1  PHOS  --   --   --  5.3* 5.2*   Liver Function Tests:  Recent Labs Lab 08/21/16 0553 08/24/16 0557 08/25/16 0553 08/26/16 0526  AST 15 21  --   --   ALT 15* 13*  --   --   ALKPHOS 86 94  --   --   BILITOT 1.2 0.9  --   --   PROT 6.8 6.4*  --   --   ALBUMIN 3.6 2.8* 2.5* 2.5*   No results for input(s): LIPASE, AMYLASE in the last 168 hours. No results for input(s): AMMONIA in the last 168 hours. CBC:  Recent Labs Lab 08/21/16 0553 08/23/16 0554 08/26/16 0526  WBC 20.0* 21.0* 16.4*  HGB 12.5* 11.4* 10.0*  HCT 40.0 36.2* 31.6*  MCV 89.3 88.9 87.1  PLT 204 192 204   Cardiac Enzymes: No results for input(s): CKTOTAL, CKMB, CKMBINDEX, TROPONINI in the last 168 hours. BNP: BNP (last 3 results)  Recent Labs  03/19/16 1222 06/08/16 1133 07/24/16 1006  BNP 156.0* 1,759.0* 251.0*    ProBNP (last 3 results) No results for input(s): PROBNP in the last 8760 hours.  CBG:  Recent Labs Lab 08/26/16 1119 08/26/16 1640 08/26/16 2022 08/27/16 0725 08/27/16 1112  GLUCAP 165* 167*  133* 116* 152*       Signed:  Woodford Duncan M  [facility] Pager: [phone] 08/27/2016, 1:06 PM

## 2016-08-27 NOTE — Progress Notes (Signed)
Patient is to be discharged home and in stable condition. IV removed, WNL. Patient given discharge instructions and verbalized understanding. Patient transportation present and patient awaiting oxygen supply. Will be transported out by staff via wheelchair upon arrival of oxygen.  Celestia Khat, RN
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
## 2016-08-27 NOTE — Progress Notes (Signed)
Subjective: Interval History: Patient offers no complaints He. Is feeling much better. He denies any difficulty breathing.  Objective: Vital signs in last 24 hours: Temp:  [98.7 F (37.1 C)-101.4 F (38.6 C)] 98.7 F (37.1 C) (08/23 0537) Pulse Rate:  [92-105] 92 (08/23 0537) Resp:  [18-24] 23 (08/23 0537) BP: (142-159)/(76-82) 159/82 (08/23 0537) SpO2:  [84 %-97 %] 97 % (08/23 0537) Weight change:   Intake/Output from previous day: 08/22 0701 - 08/23 0700 In: 3135 [P.O.:360; I.V.:2675; IV Piggyback:100] Out: 900 [Urine:900] Intake/Output this shift: No intake/output data recorded. Generally patient is somewhat sleepy but arousable. Chest is clear to auscultation Heart exam regular rate and rhythm Extremities no edema  Lab Results:  Recent Labs  08/26/16 0526  WBC 16.4*  HGB 10.0*  HCT 31.6*  PLT 204   BMET:   Recent Labs  08/25/16 0553 08/26/16 0526  NA 136 135  K 4.1 4.5  CL 104 105  CO2 23 22  GLUCOSE 74 160*  BUN 70* 70*  CREATININE 2.42* 2.10*  CALCIUM 9.2 9.1   No results for input(s): PTH in the last 72 hours. Iron Studies: No results for input(s): IRON, TIBC, TRANSFERRIN, FERRITIN in the last 72 hours.  Studies/Results: No results found.  I have reviewed the patient's current medications.  Assessment/Plan: Problem #1 acute kidney injury superimposed on chronic. Patient remained nonoliguric. His renal function continued to improve. His creatinine slightly above his baseline. Problem #2 chronic renal failure: Possibly stage III. Thought to be secondary to diabetes/hypertension/ischemic.  Problem #3 cellulitis of his foot: Patient is on antibiotics. His febrile but his white blood cell count is high. Problem #4 history of COPD Problem #5 hypertension: His blood pressure is reasonably controlled Problem #6 diabetes Problem #7 anemia: His hemoglobin is within our target goal. Problem #8 proteinuria: Most likely secondary to diabetes. Presently  hepatitis B surface antigen, hepatitis C antibody is negative and complement is normal. Problem #9 bone and mineral disorder: His calcium and phosphorus is the range. Plan: 1]We'll continue his present management 2]We'll check his renal panel in the morning.   LOS: 7 days   Laterica Matarazzo S 08/27/2016,8:13 AM

## 2016-08-27 NOTE — Care Management (Addendum)
    Durable Medical Equipment        Start     Ordered   08/27/16 1244  For home use only DME oxygen  Once    Question Answer Comment  Mode or (Route) Nasal cannula   Liters per Minute 2   Frequency Continuous (stationary and portable oxygen unit needed)   Oxygen conserving device Yes   Oxygen delivery system Gas      08/27/16 1244   08/26/16 1110  For home use only DME Walker rolling  Once    Comments:  Cellulitis in foot/gait instability/deconditioning  Question:  Patient needs a walker to treat with the following condition  Answer:  Weakness   08/26/16 1110     Patient qualifies for home oxygen. Neb treatments and IV antibiotics have been tried and failed. Romualdo Bolk of [facility] notified and will obtain order from chart. Port tank will be delivered to room prior to discharge.

## 2016-09-01 ENCOUNTER — Emergency Department (HOSPITAL_COMMUNITY): Payer: Medicare Other

## 2016-09-01 ENCOUNTER — Inpatient Hospital Stay (HOSPITAL_COMMUNITY)
Admission: EM | Admit: 2016-09-01 | Discharge: 2016-09-09 | DRG: 623 | Disposition: A | Discharge status: Skilled Nursing Facility | Payer: Medicare Other | Attending: Family Medicine | Admitting: Family Medicine

## 2016-09-01 ENCOUNTER — Encounter (HOSPITAL_COMMUNITY): Payer: Self-pay | Admitting: *Deleted

## 2016-09-01 DIAGNOSIS — H9190 Unspecified hearing loss, unspecified ear: Secondary | ICD-10-CM | POA: Diagnosis present

## 2016-09-01 DIAGNOSIS — J449 Chronic obstructive pulmonary disease, unspecified: Secondary | ICD-10-CM | POA: Diagnosis present

## 2016-09-01 DIAGNOSIS — K210 Gastro-esophageal reflux disease with esophagitis: Secondary | ICD-10-CM | POA: Diagnosis present

## 2016-09-01 DIAGNOSIS — N400 Enlarged prostate without lower urinary tract symptoms: Secondary | ICD-10-CM | POA: Diagnosis present

## 2016-09-01 DIAGNOSIS — G4730 Sleep apnea, unspecified: Secondary | ICD-10-CM | POA: Diagnosis present

## 2016-09-01 DIAGNOSIS — I5032 Chronic diastolic (congestive) heart failure: Secondary | ICD-10-CM | POA: Diagnosis present

## 2016-09-01 DIAGNOSIS — R410 Disorientation, unspecified: Secondary | ICD-10-CM | POA: Diagnosis present

## 2016-09-01 DIAGNOSIS — S91332A Puncture wound without foreign body, left foot, initial encounter: Secondary | ICD-10-CM | POA: Diagnosis present

## 2016-09-01 DIAGNOSIS — L02612 Cutaneous abscess of left foot: Secondary | ICD-10-CM | POA: Diagnosis present

## 2016-09-01 DIAGNOSIS — G8929 Other chronic pain: Secondary | ICD-10-CM | POA: Diagnosis present

## 2016-09-01 DIAGNOSIS — M5440 Lumbago with sciatica, unspecified side: Secondary | ICD-10-CM

## 2016-09-01 DIAGNOSIS — F502 Bulimia nervosa: Secondary | ICD-10-CM

## 2016-09-01 DIAGNOSIS — E1122 Type 2 diabetes mellitus with diabetic chronic kidney disease: Secondary | ICD-10-CM | POA: Diagnosis present

## 2016-09-01 DIAGNOSIS — N183 Chronic kidney disease, stage 3 unspecified: Secondary | ICD-10-CM

## 2016-09-01 DIAGNOSIS — E0800 Diabetes mellitus due to underlying condition with hyperosmolarity without nonketotic hyperglycemic-hyperosmolar coma (NKHHC): Secondary | ICD-10-CM

## 2016-09-01 DIAGNOSIS — E785 Hyperlipidemia, unspecified: Secondary | ICD-10-CM | POA: Diagnosis present

## 2016-09-01 DIAGNOSIS — R112 Nausea with vomiting, unspecified: Secondary | ICD-10-CM | POA: Diagnosis present

## 2016-09-01 DIAGNOSIS — L97529 Non-pressure chronic ulcer of other part of left foot with unspecified severity: Secondary | ICD-10-CM | POA: Diagnosis present

## 2016-09-01 DIAGNOSIS — I96 Gangrene, not elsewhere classified: Secondary | ICD-10-CM | POA: Diagnosis present

## 2016-09-01 DIAGNOSIS — L089 Local infection of the skin and subcutaneous tissue, unspecified: Secondary | ICD-10-CM

## 2016-09-01 DIAGNOSIS — I2510 Atherosclerotic heart disease of native coronary artery without angina pectoris: Secondary | ICD-10-CM

## 2016-09-01 DIAGNOSIS — E11621 Type 2 diabetes mellitus with foot ulcer: Secondary | ICD-10-CM | POA: Diagnosis present

## 2016-09-01 DIAGNOSIS — I87009 Postthrombotic syndrome without complications of unspecified extremity: Secondary | ICD-10-CM | POA: Diagnosis present

## 2016-09-01 DIAGNOSIS — I4891 Unspecified atrial fibrillation: Secondary | ICD-10-CM | POA: Diagnosis present

## 2016-09-01 DIAGNOSIS — M549 Dorsalgia, unspecified: Secondary | ICD-10-CM | POA: Diagnosis present

## 2016-09-01 DIAGNOSIS — E1165 Type 2 diabetes mellitus with hyperglycemia: Secondary | ICD-10-CM | POA: Diagnosis present

## 2016-09-01 DIAGNOSIS — Z79899 Other long term (current) drug therapy: Secondary | ICD-10-CM

## 2016-09-01 DIAGNOSIS — L03116 Cellulitis of left lower limb: Secondary | ICD-10-CM | POA: Diagnosis present

## 2016-09-01 DIAGNOSIS — K222 Esophageal obstruction: Secondary | ICD-10-CM

## 2016-09-01 DIAGNOSIS — E1152 Type 2 diabetes mellitus with diabetic peripheral angiopathy with gangrene: Secondary | ICD-10-CM | POA: Diagnosis present

## 2016-09-01 DIAGNOSIS — R079 Chest pain, unspecified: Secondary | ICD-10-CM | POA: Diagnosis not present

## 2016-09-01 DIAGNOSIS — Z87442 Personal history of urinary calculi: Secondary | ICD-10-CM

## 2016-09-01 DIAGNOSIS — Z794 Long term (current) use of insulin: Secondary | ICD-10-CM

## 2016-09-01 DIAGNOSIS — I10 Essential (primary) hypertension: Secondary | ICD-10-CM

## 2016-09-01 DIAGNOSIS — J441 Chronic obstructive pulmonary disease with (acute) exacerbation: Secondary | ICD-10-CM | POA: Diagnosis not present

## 2016-09-01 DIAGNOSIS — W228XXA Striking against or struck by other objects, initial encounter: Secondary | ICD-10-CM | POA: Diagnosis present

## 2016-09-01 DIAGNOSIS — R600 Localized edema: Secondary | ICD-10-CM

## 2016-09-01 DIAGNOSIS — R739 Hyperglycemia, unspecified: Secondary | ICD-10-CM

## 2016-09-01 DIAGNOSIS — I130 Hypertensive heart and chronic kidney disease with heart failure and stage 1 through stage 4 chronic kidney disease, or unspecified chronic kidney disease: Secondary | ICD-10-CM | POA: Diagnosis present

## 2016-09-01 DIAGNOSIS — I481 Persistent atrial fibrillation: Secondary | ICD-10-CM | POA: Diagnosis present

## 2016-09-01 DIAGNOSIS — N179 Acute kidney failure, unspecified: Secondary | ICD-10-CM | POA: Diagnosis present

## 2016-09-01 DIAGNOSIS — L03032 Cellulitis of left toe: Secondary | ICD-10-CM | POA: Diagnosis not present

## 2016-09-01 DIAGNOSIS — Z7901 Long term (current) use of anticoagulants: Secondary | ICD-10-CM

## 2016-09-01 DIAGNOSIS — Z87891 Personal history of nicotine dependence: Secondary | ICD-10-CM

## 2016-09-01 DIAGNOSIS — E119 Type 2 diabetes mellitus without complications: Secondary | ICD-10-CM

## 2016-09-01 DIAGNOSIS — Z66 Do not resuscitate: Secondary | ICD-10-CM | POA: Diagnosis present

## 2016-09-01 DIAGNOSIS — E11628 Type 2 diabetes mellitus with other skin complications: Principal | ICD-10-CM

## 2016-09-01 DIAGNOSIS — Z951 Presence of aortocoronary bypass graft: Secondary | ICD-10-CM

## 2016-09-01 DIAGNOSIS — Z8711 Personal history of peptic ulcer disease: Secondary | ICD-10-CM

## 2016-09-01 DIAGNOSIS — R1310 Dysphagia, unspecified: Secondary | ICD-10-CM

## 2016-09-01 DIAGNOSIS — Z86718 Personal history of other venous thrombosis and embolism: Secondary | ICD-10-CM

## 2016-09-01 DIAGNOSIS — J189 Pneumonia, unspecified organism: Secondary | ICD-10-CM | POA: Diagnosis not present

## 2016-09-01 LAB — URINALYSIS, ROUTINE W REFLEX MICROSCOPIC
BILIRUBIN URINE: NEGATIVE
Ketones, ur: 5 mg/dL — AB
LEUKOCYTES UA: NEGATIVE
NITRITE: NEGATIVE
PROTEIN: 100 mg/dL — AB
Specific Gravity, Urine: 1.012 (ref 1.005–1.030)
pH: 5 (ref 5.0–8.0)

## 2016-09-01 LAB — CBG MONITORING, ED
GLUCOSE-CAPILLARY: 366 mg/dL — AB (ref 65–99)
Glucose-Capillary: 435 mg/dL — ABNORMAL HIGH (ref 65–99)

## 2016-09-01 LAB — COMPREHENSIVE METABOLIC PANEL
ALK PHOS: 201 U/L — AB (ref 38–126)
ALT: 35 U/L (ref 17–63)
ANION GAP: 14 (ref 5–15)
AST: 19 U/L (ref 15–41)
Albumin: 3.1 g/dL — ABNORMAL LOW (ref 3.5–5.0)
BILIRUBIN TOTAL: 1.1 mg/dL (ref 0.3–1.2)
BUN: 31 mg/dL — ABNORMAL HIGH (ref 6–20)
CALCIUM: 10.5 mg/dL — AB (ref 8.9–10.3)
CO2: 25 mmol/L (ref 22–32)
Chloride: 93 mmol/L — ABNORMAL LOW (ref 101–111)
Creatinine, Ser: 1.49 mg/dL — ABNORMAL HIGH (ref 0.61–1.24)
GFR, EST AFRICAN AMERICAN: 48 mL/min — AB (ref >60)
GFR, EST NON AFRICAN AMERICAN: 42 mL/min — AB (ref >60)
Glucose, Bld: 423 mg/dL — ABNORMAL HIGH (ref 65–99)
POTASSIUM: 4.6 mmol/L (ref 3.5–5.1)
Sodium: 132 mmol/L — ABNORMAL LOW (ref 135–145)
TOTAL PROTEIN: 7.4 g/dL (ref 6.5–8.1)

## 2016-09-01 LAB — CBC
HCT: 37.1 % — ABNORMAL LOW (ref 39.0–52.0)
HEMOGLOBIN: 11.8 g/dL — AB (ref 13.0–17.0)
MCH: 27.4 pg (ref 26.0–34.0)
MCHC: 31.8 g/dL (ref 30.0–36.0)
MCV: 86.3 fL (ref 78.0–100.0)
Platelets: 303 10*3/uL (ref 150–400)
RBC: 4.3 MIL/uL (ref 4.22–5.81)
RDW: 13.5 % (ref 11.5–15.5)
WBC: 28.7 10*3/uL — AB (ref 4.0–10.5)

## 2016-09-01 LAB — LACTIC ACID, PLASMA
LACTIC ACID, VENOUS: 1.4 mmol/L (ref 0.5–1.9)
Lactic Acid, Venous: 1.1 mmol/L (ref 0.5–1.9)

## 2016-09-01 LAB — GLUCOSE, CAPILLARY: Glucose-Capillary: 323 mg/dL — ABNORMAL HIGH (ref 65–99)

## 2016-09-01 LAB — LIPASE, BLOOD: Lipase: 26 U/L (ref 11–51)

## 2016-09-01 LAB — SEDIMENTATION RATE: Sed Rate: 85 mm/hr — ABNORMAL HIGH (ref 0–16)

## 2016-09-01 MED ORDER — ONDANSETRON HCL 4 MG/2ML IJ SOLN
4.0000 mg | Freq: Four times a day (QID) | INTRAMUSCULAR | Status: DC | PRN
  Administered 2016-09-02 – 2016-09-06 (×4): 4 mg via INTRAVENOUS
  Filled 2016-09-01 (×5): qty 2

## 2016-09-01 MED ORDER — ROSUVASTATIN CALCIUM 10 MG PO TABS
10.0000 mg | ORAL_TABLET | Freq: Every day | ORAL | Status: DC
  Administered 2016-09-02 – 2016-09-08 (×5): 10 mg via ORAL
  Filled 2016-09-01 (×6): qty 1

## 2016-09-01 MED ORDER — GI COCKTAIL ~~LOC~~
30.0000 mL | Freq: Once | ORAL | Status: AC
  Administered 2016-09-01: 30 mL via ORAL
  Filled 2016-09-01: qty 30

## 2016-09-01 MED ORDER — ACETAMINOPHEN 650 MG RE SUPP: 650.0000 mg | Freq: Four times a day (QID) | RECTAL | Status: DC | PRN

## 2016-09-01 MED ORDER — IPRATROPIUM-ALBUTEROL 0.5-2.5 (3) MG/3ML IN SOLN
3.0000 mL | Freq: Four times a day (QID) | RESPIRATORY_TRACT | Status: DC | PRN
  Administered 2016-09-01 – 2016-09-03 (×2): 3 mL via RESPIRATORY_TRACT
  Filled 2016-09-01 (×2): qty 3

## 2016-09-01 MED ORDER — ONDANSETRON HCL 4 MG PO TABS: 4.0000 mg | ORAL_TABLET | Freq: Four times a day (QID) | ORAL | Status: DC | PRN

## 2016-09-01 MED ORDER — METOPROLOL SUCCINATE ER 50 MG PO TB24
75.0000 mg | ORAL_TABLET | Freq: Every day | ORAL | Status: DC
  Administered 2016-09-02 – 2016-09-08 (×5): 75 mg via ORAL
  Filled 2016-09-01 (×7): qty 1

## 2016-09-01 MED ORDER — SODIUM CHLORIDE 0.9 % IV SOLN
INTRAVENOUS | Status: AC
  Filled 2016-09-01: qty 1500

## 2016-09-01 MED ORDER — SODIUM CHLORIDE 0.9 % IV SOLN
INTRAVENOUS | Status: DC
Start: 2016-09-01 — End: 2016-09-04
  Administered 2016-09-01 – 2016-09-04 (×2): via INTRAVENOUS

## 2016-09-01 MED ORDER — TIMOLOL HEMIHYDRATE 0.5 % OP SOLN: 1.0000 [drp] | Freq: Every day | OPHTHALMIC | Status: DC

## 2016-09-01 MED ORDER — CLONIDINE HCL 0.1 MG PO TABS
0.1000 mg | ORAL_TABLET | Freq: Two times a day (BID) | ORAL | Status: DC
  Administered 2016-09-01 – 2016-09-08 (×12): 0.1 mg via ORAL
  Filled 2016-09-01 (×14): qty 1

## 2016-09-01 MED ORDER — METRONIDAZOLE IN NACL 5-0.79 MG/ML-% IV SOLN
500.0000 mg | Freq: Three times a day (TID) | INTRAVENOUS | Status: DC
  Administered 2016-09-01: 500 mg via INTRAVENOUS
  Filled 2016-09-01: qty 100

## 2016-09-01 MED ORDER — PIPERACILLIN-TAZOBACTAM 3.375 G IVPB
3.3750 g | Freq: Three times a day (TID) | INTRAVENOUS | Status: DC
  Administered 2016-09-02 – 2016-09-09 (×23): 3.375 g via INTRAVENOUS
  Filled 2016-09-01 (×22): qty 50

## 2016-09-01 MED ORDER — SUCRALFATE 1 G PO TABS
1.0000 g | ORAL_TABLET | Freq: Three times a day (TID) | ORAL | Status: DC
  Administered 2016-09-01 – 2016-09-09 (×25): 1 g via ORAL
  Filled 2016-09-01 (×29): qty 1

## 2016-09-01 MED ORDER — INSULIN GLARGINE 100 UNIT/ML ~~LOC~~ SOLN
50.0000 [IU] | Freq: Every day | SUBCUTANEOUS | Status: DC
  Administered 2016-09-01 – 2016-09-02 (×2): 50 [IU] via SUBCUTANEOUS
  Filled 2016-09-01 (×3): qty 0.5

## 2016-09-01 MED ORDER — CLINDAMYCIN PHOSPHATE 900 MG/50ML IV SOLN
900.0000 mg | Freq: Three times a day (TID) | INTRAVENOUS | Status: DC
  Administered 2016-09-02 – 2016-09-08 (×20): 900 mg via INTRAVENOUS
  Filled 2016-09-01 (×25): qty 50

## 2016-09-01 MED ORDER — ONDANSETRON HCL 4 MG/2ML IJ SOLN
INTRAMUSCULAR | Status: AC
  Administered 2016-09-01: 12:00:00 via INTRAVENOUS
  Filled 2016-09-01: qty 2

## 2016-09-01 MED ORDER — ONDANSETRON HCL 4 MG/2ML IJ SOLN
4.0000 mg | Freq: Once | INTRAMUSCULAR | Status: AC
  Administered 2016-09-01: 4 mg via INTRAVENOUS

## 2016-09-01 MED ORDER — ENOXAPARIN SODIUM 100 MG/ML ~~LOC~~ SOLN
95.0000 mg | Freq: Two times a day (BID) | SUBCUTANEOUS | Status: DC
  Administered 2016-09-01 – 2016-09-08 (×15): 95 mg via SUBCUTANEOUS
  Filled 2016-09-01 (×15): qty 1

## 2016-09-01 MED ORDER — TAMSULOSIN HCL 0.4 MG PO CAPS
0.4000 mg | ORAL_CAPSULE | Freq: Every day | ORAL | Status: DC
  Administered 2016-09-02 – 2016-09-08 (×6): 0.4 mg via ORAL
  Filled 2016-09-01 (×7): qty 1

## 2016-09-01 MED ORDER — LACTATED RINGERS IV SOLN
INTRAVENOUS | Status: DC
  Administered 2016-09-01 – 2016-09-08 (×13): via INTRAVENOUS

## 2016-09-01 MED ORDER — VANCOMYCIN HCL IN DEXTROSE 1-5 GM/200ML-% IV SOLN
1000.0000 mg | Freq: Once | INTRAVENOUS | Status: AC
  Administered 2016-09-01: 1000 mg via INTRAVENOUS
  Filled 2016-09-01: qty 200

## 2016-09-01 MED ORDER — INSULIN ASPART 100 UNIT/ML ~~LOC~~ SOLN
10.0000 [IU] | Freq: Once | SUBCUTANEOUS | Status: AC
  Administered 2016-09-01: 10 [IU] via SUBCUTANEOUS
  Filled 2016-09-01: qty 1

## 2016-09-01 MED ORDER — DOCUSATE SODIUM 100 MG PO CAPS
100.0000 mg | ORAL_CAPSULE | Freq: Two times a day (BID) | ORAL | Status: DC
  Administered 2016-09-01 – 2016-09-08 (×12): 100 mg via ORAL
  Filled 2016-09-01 (×14): qty 1

## 2016-09-01 MED ORDER — PANTOPRAZOLE SODIUM 40 MG PO TBEC
40.0000 mg | DELAYED_RELEASE_TABLET | Freq: Every day | ORAL | Status: DC
  Administered 2016-09-02 – 2016-09-08 (×5): 40 mg via ORAL
  Filled 2016-09-01 (×6): qty 1

## 2016-09-01 MED ORDER — SODIUM CHLORIDE 0.9 % IV BOLUS (SEPSIS)
500.0000 mL | Freq: Once | INTRAVENOUS | Status: AC
  Administered 2016-09-01: 500 mL via INTRAVENOUS

## 2016-09-01 MED ORDER — OXYCODONE HCL 5 MG PO TABS
30.0000 mg | ORAL_TABLET | Freq: Three times a day (TID) | ORAL | Status: DC | PRN
  Administered 2016-09-02 (×2): 30 mg via ORAL
  Filled 2016-09-01 (×2): qty 6

## 2016-09-01 MED ORDER — INSULIN ASPART 100 UNIT/ML ~~LOC~~ SOLN
0.0000 [IU] | Freq: Every day | SUBCUTANEOUS | Status: DC
  Administered 2016-09-01: 4 [IU] via SUBCUTANEOUS
  Administered 2016-09-04: 3 [IU] via SUBCUTANEOUS
  Administered 2016-09-06 – 2016-09-08 (×2): 2 [IU] via SUBCUTANEOUS

## 2016-09-01 MED ORDER — DEXTROSE 5 % IV SOLN
2.0000 g | INTRAVENOUS | Status: DC
  Administered 2016-09-01: 2 g via INTRAVENOUS
  Filled 2016-09-01 (×2): qty 2

## 2016-09-01 MED ORDER — CLINDAMYCIN PHOSPHATE 900 MG/50ML IV SOLN
INTRAVENOUS | Status: AC
  Filled 2016-09-01: qty 50

## 2016-09-01 MED ORDER — TIMOLOL MALEATE 0.5 % OP SOLN
1.0000 [drp] | Freq: Every day | OPHTHALMIC | Status: DC
  Administered 2016-09-01 – 2016-09-08 (×8): 1 [drp] via OPHTHALMIC
  Filled 2016-09-01 (×2): qty 5

## 2016-09-01 MED ORDER — HYDRALAZINE HCL 20 MG/ML IJ SOLN: 5.0000 mg | INTRAMUSCULAR | Status: DC | PRN

## 2016-09-01 MED ORDER — INSULIN ASPART 100 UNIT/ML ~~LOC~~ SOLN
0.0000 [IU] | Freq: Three times a day (TID) | SUBCUTANEOUS | Status: DC
  Administered 2016-09-02 (×2): 11 [IU] via SUBCUTANEOUS
  Administered 2016-09-02 – 2016-09-04 (×2): 3 [IU] via SUBCUTANEOUS
  Administered 2016-09-04: 11 [IU] via SUBCUTANEOUS
  Administered 2016-09-05: 7 [IU] via SUBCUTANEOUS
  Administered 2016-09-05: 4 [IU] via SUBCUTANEOUS
  Administered 2016-09-05: 7 [IU] via SUBCUTANEOUS
  Administered 2016-09-06 (×3): 4 [IU] via SUBCUTANEOUS
  Administered 2016-09-07: 7 [IU] via SUBCUTANEOUS
  Administered 2016-09-07: 4 [IU] via SUBCUTANEOUS
  Administered 2016-09-07: 3 [IU] via SUBCUTANEOUS
  Administered 2016-09-08: 4 [IU] via SUBCUTANEOUS
  Administered 2016-09-08: 7 [IU] via SUBCUTANEOUS
  Administered 2016-09-09: 4 [IU] via SUBCUTANEOUS

## 2016-09-01 MED ORDER — VANCOMYCIN HCL 10 G IV SOLR
1500.0000 mg | INTRAVENOUS | Status: DC
  Administered 2016-09-02 (×2): 1500 mg via INTRAVENOUS
  Filled 2016-09-01 (×7): qty 1500

## 2016-09-01 MED ORDER — ACETAMINOPHEN 325 MG PO TABS
650.0000 mg | ORAL_TABLET | Freq: Four times a day (QID) | ORAL | Status: DC | PRN
Start: 2016-09-01 — End: 2016-09-09

## 2016-09-01 NOTE — H&P (Addendum)
History and Physical    [name] HKV:[id number] DOB: October 07, 1931 DOA: 09/01/2016  PCP: [name], MD Consultants:  Lowanda Foster - nephrology (inpt consult); Domenic Polite - cardiology; Rourk - GI Patient coming from:  Home - lives alone; NOK: Daughter, [phone]   Chief Complaint: foot infection  HPI: [name] is a 83 y.o. male with medical history significant of dCHF, COPD, CKD 3, HTN, CAD, DM2, HLD, BPH who was hospitalized from 8/16 - 23 for a diabetic foot infection; he was discharged on Clindamycin.  Since discharge, he has had esophageal pain, n/v, weakness, and has been unable to eat.  He did not feel good when he left, unable to stand up.  He refused SNF rehab with last discharge; he now recognizes that this might be necessary.  +fever, subjective; + chills.  This started after he stepped on a bolt - he reports that it was NOT better at the time of last discharge.  He has continued to go "downhill:" since he was admitted last time.  He reports that he did take Clinda following last hospitalization.     ED Course: Dr. Arnoldo Morale will consult.  Continue abx, treat hyperglycemia.  Given Vanc in ER.  Normal lactate.  Marked leukocytosis.  10 units IV insulin for glucose 400+ with improvement.  Blood cultures pending.  Given GI cocktail for presumed esophagitis associated with recurrent n/v.  Foot with cellulitis, possible pus between 3/4 metatarsals.  Review of Systems: As per HPI; otherwise review of systems reviewed and negative.   Ambulatory Status:  Ambulated without assistance prior to last admission; now he is unable to do more than transfer  Past Medical History:  Diagnosis Date  . BPH (benign prostatic hyperplasia)   . CAD (coronary artery disease)    Multivessel status post CABG 2011 - LIMA to LAD, SVG to diagonal, SVG to OM, SVG to PDA  . Cellulitis    12/15  . Chronic back pain   . Chronic diastolic CHF (congestive heart failure) ([place])   . CKD (chronic kidney  disease), stage III   . COPD (chronic obstructive pulmonary disease) ([place])   . Essential hypertension   . Gastric mass    EGD 9/15  . GERD (gastroesophageal reflux disease)   . History of DVT (deep vein thrombosis)    Postphlebitic syndrome  . History of kidney stones   . HOH (hard of hearing)   . Hx of CABG   . Hyperlipidemia   . Persistent atrial fibrillation ([place])    a. s/p DCCV 03/2016.  [name] Sleep apnea    Stop Bang score of 5  . Type 2 diabetes mellitus ([place])     Past Surgical History:  Procedure Laterality Date  . CARDIOVERSION N/A 03/20/2016   Procedure: CARDIOVERSION;  Surgeon: Arnoldo Lenis, MD;  Location: [facility];  Service: Endoscopy;  Laterality: N/A;  . CATARACT EXTRACTION W/PHACO Left 02/07/2016   Procedure: CATARACT EXTRACTION PHACO AND INTRAOCULAR LENS PLACEMENT (IOC);  Surgeon: [name], MD;  Location: [facility];  Service: Ophthalmology;  Laterality: Left;  CDE:  23.13  . COLONOSCOPY  2004   Dr. Laural Golden: three small polyps at cecum, path unknown, external hemorrhoids  . COLONOSCOPY N/A 08/16/2013   Dr. [name]: incomplete prep. multiple tubular adenomas, multiple biopsies. Needs surveillance in Aug 2016 due to poor prep  . CORONARY ARTERY BYPASS GRAFT     x5  . CORONARY ARTERY BYPASS GRAFT  2011  . CYSTOSCOPY N/A 12/12/2012   Procedure: CYSTOSCOPY  FLEXIBLE;  Surgeon: Marissa Nestle, MD;  Location: [facility];  Service: Urology;  Laterality: N/A;  . ESOPHAGOGASTRODUODENOSCOPY N/A 08/16/2013   Dr. [name]: normal esophagus s/p Maloney dilation, gastric erosions, submucosal gastric mass vs extrinsic mass  . EUS N/A 09/07/2013   Dr. 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 INCISION AND DRAINAGE ABSCESS N/A 12/20/2013   Procedure: INCISION AND DRAINAGE ABSCESS NECK;  Surgeon: Jamesetta So, MD;  Location: [facility];  Service: General;  Laterality: N/A;  . Venia Minks DILATION N/A 08/16/2013   Procedure: Keturah Shavers;  Surgeon: Daneil Dolin, MD;   Location: [facility];  Service: Endoscopy;  Laterality: N/A;    Social History   Social History  . Marital status: Single    Spouse name: N/A  . Number of children: N/A  . Years of education: N/A   Occupational History  . Retired    Social History Main Topics  . Smoking status: Former Smoker    Packs/day: 1.00    Years: 11.00    Quit date: 01/05/1956  . Smokeless tobacco: Never Used  . Alcohol use No  . Drug use: No  . Sexual activity: No   Other Topics Concern  . Not on file   Social History Narrative   ** Merged History Encounter **        No Known Allergies  Family History  Problem Relation Age of Onset  . Colon cancer Son 44       deceased    Prior to Admission medications   Medication Sig Start Date End Date Taking? Authorizing Provider  amLODipine-olmesartan (AZOR) 5-40 MG tablet Take 1 tablet by mouth daily.   Yes [provider]  apixaban (ELIQUIS) 5 MG TABS tablet Take 5 mg by mouth 2 (two) times daily.   Yes [provider]  cholecalciferol (VITAMIN D) 1000 units tablet Take 5,000 Units by mouth daily.   Yes [provider]  clindamycin (CLEOCIN) 300 MG capsule Take 1 capsule (300 mg total) by mouth every 8 (eight) hours. 08/27/16  Yes [name], MD  cloNIDine (CATAPRES) 0.1 MG tablet Take 0.1 mg by mouth 2 (two) times daily.   Yes [provider]  docusate sodium (COLACE) 100 MG capsule Take 100 mg by mouth 2 (two) times daily.   Yes [provider]  furosemide (LASIX) 20 MG tablet TAKE 20 MG IN THE AM AND ALTERNATE 20 MG IN THE EVENING 07/09/16  Yes Lendon Colonel, NP  insulin glargine (LANTUS) 100 UNIT/ML injection Inject 0.5 mLs (50 Units total) into the skin at bedtime. 06/15/16  Yes Dondiego, Richard, MD  ipratropium-albuterol (DUONEB) 0.5-2.5 (3) MG/3ML SOLN Take 3 mLs by nebulization every 6 (six) hours as needed (wheezing).   Yes [provider]  metoprolol succinate (TOPROL-XL) 25  MG 24 hr tablet Take 3 tablets (75 mg total) by mouth daily. Take with or immediately following a meal. 06/16/16  Yes Dondiego, Richard, MD  nitroGLYCERIN (NITROSTAT) 0.4 MG SL tablet Place 1 tablet (0.4 mg total) under the tongue every 5 (five) minutes as needed for chest pain. 12/22/13  Yes Dondiego, Delfino Lovett, MD  oxyCODONE (ROXICODONE) 15 MG immediate release tablet Take 30 mg by mouth every 8 (eight) hours as needed for pain.    Yes [provider]  pantoprazole (PROTONIX) 40 MG tablet Take 40 mg by mouth daily.   Yes [provider]  rosuvastatin (CRESTOR) 10 MG tablet Take 10 mg by mouth  daily.   Yes [provider]  silodosin (RAPAFLO) 8 MG CAPS capsule Take 8 mg by mouth daily with breakfast.   Yes [provider]  timolol (BETIMOL) 0.5 % ophthalmic solution Place 1 drop into the left eye daily.   Yes [provider]    Physical Exam: Vitals:   09/01/16 1818 09/01/16 2000 09/01/16 2115 09/01/16 2203  BP: (!) 106/93  (!) 143/71   Pulse: 81  76   Resp: 20  12   Temp: 98.1 F (36.7 C)  98.4 F (36.9 C)   TempSrc: Oral  Oral   SpO2: 100% 100% 99% 100%  Weight: 92.7 kg (204 lb 5.9 oz)     Height: 5' 10.5" (1.791 m)        General:  Appears calm and comfortable and is NAD; cantankerous Eyes:  PERRL, EOMI, normal lids, iris ENT:  grossly normal hearing, lips & tongue, mmm Neck:  no LAD, masses or thyromegaly; no carotid bruits Cardiovascular:  RRR, no m/r/g.  Respiratory:   CTA bilaterally with no wheezes/rales/rhonchi.  Normal respiratory effort. Abdomen:  soft, NT, ND, NABS Back:   normal alignment, no CVAT Skin: Wound noted in plantar left foot from prior injury.  Erythema extends along entire foot and up to mid-calf.  There is surrounding edema of the foot and lower leg.  Pulses appear to be present but diminished on the left. Musculoskeletal: grossly normal tone BUE/BLE, good ROM, no bony abnormality Psychiatric: cantankerous mood  and affect, speech fluent and appropriate, AOx3 Neurologic:  CN 2-12 grossly intact, moves all extremities in coordinated fashion, sensation intact    Radiological Exams on Admission: Dg Chest 2 View  Result Date: 09/01/2016 CLINICAL DATA:  Generalized abdominal and chest pain. EXAM: CHEST  2 VIEW COMPARISON:  08/27/2016. FINDINGS: Stable cardiomegaly with aortic atherosclerosis. Status post CABG. Mild interstitial edema with atelectasis at the lung bases. No pneumonic consolidation, effusion or pneumothorax. No acute osseous abnormality. IMPRESSION: 1. Stable cardiomegaly with aortic atherosclerosis and post CABG change. 2. Bibasilar atelectasis with minimal interstitial edema. Electronically Signed   By: Ashley Royalty M.D.   On: 09/01/2016 14:28   Dg Foot Complete Left  Result Date: 09/01/2016 CLINICAL DATA:  Left foot redness. EXAM: LEFT FOOT - COMPLETE 3+ VIEW COMPARISON:  08/20/2016 FINDINGS: There is no evidence of fracture or dislocation. There is no periosteal reaction or bone destruction. There soft tissue emphysema between the third and fourth metatarsals concerning for infection. IMPRESSION: 1. No evidence of osteomyelitis of the left foot. 2. Soft tissue emphysema between the third and fourth metatarsals concerning for necrotizing infection. Electronically Signed   By: Kathreen Devoid   On: 09/01/2016 14:29    EKG: Independently reviewed.  Afib with PVCs with rate 95; no evidence of acute ischemia   Labs on Admission: I have personally reviewed the available labs and imaging studies at the time of the admission.  Pertinent labs:   Glucose 423, 366, 323 ESR 85 UA: >500 glucose, 5 Hgb, 100 protein Lactate 1.4, 1.1 Na++ 132 BUN 31/Creatinine 1.49/GFR 42 - improved from 8/22 [facility] 201 Albumin 3.1, improved WBC 28.7  Assessment/Plan Principal Problem:   Diabetic foot infection (HCC) Active Problems:   Essential hypertension   Nausea and vomiting   Diabetes (HCC)   CKD (chronic  kidney disease), stage III   Esophageal stricture   Atrial fibrillation with normal ventricular rate (HCC)   Diabetic foot infection -While there appears to be a component of cellulitis, this  appears to be a much deeper infection and is concerning for a necrotizing infection by x-ray -Surgery to consult, as the patient likely needs at least I&D -Will admit, Med Surg -Antibiotics with Cefepime, Flagyl, and Vancomycin per diabetic foot ulcer order set - although after checking with pharmacy to ensure that this will cover gas-forming organisms as well, antibiotics were changed to Vanc/Clinda/Zosyn -May need MRI to assess for osteomyelitis -Will order ABIs to ensure adequate circulation to allow for healing -Diabetic foot infection order set utilized, including orders for CM, SW, DM coordinator, wound care, and nutrition consult. -Goal would be for glucose <150 to facilitate wound healing. -Patient should be on bed rest, non-weight bearing. -Excellent BP control is needed  -Discontinue PO Clindamycin for now -Continue home oxycodone for pain  HTN -Continue Clonidine, Toprol XL -Hold Amlodipine-Olmesartan due to CKD -Will add prn IV hydralazine  N/V, h/o esophageal stricture -For esophageal dilation, but he has to be off the Eliquis for 2-3 days prior to the procedure -Consider GI consult during hospitalization since this is currently being held. -Carafate for now for presumed esophagitis associated with n/v.   Afib on Eliquis -Rate controlled -Hold Eliquis in case of need for intervention -Change to Lovenox for now, but heparin could also be considered  DM -A1c was 9.8 in 8/16; this is likely to hinder wound healing and needs to be improved -Continue Lantus -Cover with SSI  CKD -Improved from prior hospitalization -Avoid ACE/ARB/NSAIDs -It may be time to consider discontinuation of ACE/ARB  DVT prophylaxis: Lovenox  Code Status: DNR - confirmed with patient/family Family  Communication: Daughter present throughout evaluation  Disposition Plan:  To be determined, likely to need SNF Consults called: Surgery; CM/SW/PT/Nutrition/Wound Care/DM coordinator Admission status: Admit - It is my clinical opinion that admission to INPATIENT is reasonable and necessary because this patient will require at least 2 midnights in the hospital to treat this condition based on the medical complexity of the problems presented.  Given the aforementioned information, the predictability of an adverse outcome is felt to be significant.    Karmen Bongo MD [facility]  If note is complete, please contact covering daytime or nighttime physician. [email or web address] Password [email or web address]  09/01/2016, 10:49 PM

## 2016-09-01 NOTE — ED Notes (Signed)
Patient made aware a urine sample is needed when possible.

## 2016-09-01 NOTE — ED Notes (Signed)
Patient transported to X-ray 

## 2016-09-01 NOTE — Progress Notes (Signed)
Pharmacy Antibiotic Note  [name] is a 83 y.o. male admitted on 09/01/2016 with diabetic foot ulcer.  Pharmacy has been consulted for vancomycin and cefepime dosing. He received vanc 1 gm at ~ 1200 today.  He will also start lovenox bridge as he was on eliquis PTA for afib and may need surgical intervention.  Plan: Vancomycin 1500 IV every 24 hours.  Goal trough 10-15 mcg/mL.  Cefepime 2gm IV q24 hours Lovenox 95 mg sq q12 hours F/u renal function, cultures and clinical course  Height: 5' 10.5" (179.1 cm) Weight: 204 lb 5.9 oz (92.7 kg) IBW/kg (Calculated) : 74.15  Temp (24hrs), Avg:97.9 F (36.6 C), Min:97.7 F (36.5 C), Max:98.1 F (36.7 C)   Recent Labs Lab 08/26/16 0526 09/01/16 1100 09/01/16 1102 09/01/16 1408  WBC 16.4* 28.7*  --   --   CREATININE 2.10* 1.49*  --   --   LATICACIDVEN  --   --  1.4 1.1    Estimated Creatinine Clearance: 43.4 mL/min (A) (by C-G formula based on SCr of 1.49 mg/dL (H)).    No Known Allergies   Thank you for allowing pharmacy to be a part of this patient's care.  Beverlee Nims 09/01/2016 6:51 PM

## 2016-09-01 NOTE — ED Notes (Signed)
CRITICAL VALUE ALERT  Critical Value:  Glucose 435  Date & Time Notied:  09/01/2016  Provider Notified: Dr. Rogene Houston   Orders Received/Actions taken: 09/01/2016

## 2016-09-01 NOTE — ED Provider Notes (Signed)
[facility] DEPT Provider Note   CSN: [id number] Arrival date & time: 09/01/16  1032     History   Chief Complaint Chief Complaint  Patient presents with  . Abdominal Pain    HPI [name] is a 83 y.o. male.  The patient is followed by Dr. Lorriane Shire. Patient had a recent admission August 16 through August 23 for an infected left foot presented as sepsis. Patient was discharged home on clindamycin 300 mg every 8 hours. Patient brought back today by friends. He lives by himself. They think is not been taking his insulin not eating or drinking very well and seemed to have some increased confusion. Also they said that the redness up to the left foot was markedly increased. Foot infection occurred secondary to stepping on a bolt. Patient also with a complaint of burning when he vomits in his chest area.patient known to have a history of atrial fibrillation. He is on Arby Barrette.      Past Medical History:  Diagnosis Date  . BPH (benign prostatic hyperplasia)   . CAD (coronary artery disease)    Multivessel status post CABG 2011 - LIMA to LAD, SVG to diagonal, SVG to OM, SVG to PDA  . Cellulitis    12/15  . Chronic back pain   . Chronic diastolic CHF (congestive heart failure) ([place])   . CKD (chronic kidney disease), stage III   . COPD (chronic obstructive pulmonary disease) ([place])   . Essential hypertension   . Gastric mass    EGD 9/15  . GERD (gastroesophageal reflux disease)   . History of DVT (deep vein thrombosis)    Postphlebitic syndrome  . History of kidney stones   . HOH (hard of hearing)   . Hx of CABG   . Hyperlipidemia   . Persistent atrial fibrillation ([place])    a. s/p DCCV 03/2016.  [name] Sleep apnea    Stop Bang score of 5  . Type 2 diabetes mellitus [facility])     Patient Active Problem List   Diagnosis Date Noted  . Cellulitis 08/20/2016  . Type 2 diabetes mellitus with hyperglycemia, with long-term current use of insulin ([place]) 07/24/2016  . Peripheral  edema   . Pressure injury of skin 06/11/2016  . Atrial fibrillation with normal ventricular rate ([place]) 06/10/2016  . Esophageal stricture 06/09/2016  . CAD in native artery 06/08/2016  . Chest pain 06/08/2016  . Persistent atrial fibrillation ([place]) 06/08/2016  . CKD (chronic kidney disease), stage III 06/08/2016  . Acute on chronic diastolic CHF (congestive heart failure) ([place]) 03/19/2016  . CAP (community acquired pneumonia) 03/19/2016  . Abnormal weight loss   . Cellulitis and abscess 12/18/2013  . Thrush, oral 12/18/2013  . Chest pain at rest 12/18/2013  . Leukocytosis 12/18/2013  . Diabetes ([place]) 12/18/2013  . Chronic back pain 12/18/2013  . Pulmonary vascular congestion 12/18/2013  . Dysphagia 12/18/2013  . Odynophagia 12/18/2013  . Gastric mass 09/07/2013  . Personal history of colonic polyps 08/05/2013  . Dysphagia, unspecified(787.20) 08/01/2013  . Nausea and vomiting 08/01/2013  . UTI (lower urinary tract infection) 12/09/2012  . Lower extremity edema 06/02/2012  . Dyspnea 06/02/2012  . Essential hypertension 06/02/2012  . History of DVT (deep vein thrombosis) 06/02/2012  . Obesity (BMI 30-39.9): BMI 31.3 06/02/2012    Past Surgical History:  Procedure Laterality Date  . CARDIOVERSION N/A 03/20/2016   Procedure: CARDIOVERSION;  Surgeon: Arnoldo Lenis, MD;  Location: [facility] ENDO SUITE;  Service: Endoscopy;  Laterality: N/A;  . CATARACT EXTRACTION W/PHACO Left 02/07/2016   Procedure: CATARACT EXTRACTION PHACO AND INTRAOCULAR LENS PLACEMENT (IOC);  Surgeon: [name], MD;  Location: [facility] ORS;  Service: Ophthalmology;  Laterality: Left;  CDE:  23.13  . COLONOSCOPY  2004   Dr. Laural Golden: three small polyps at cecum, path unknown, external hemorrhoids  . COLONOSCOPY N/A 08/16/2013   Dr. [name]: incomplete prep. multiple tubular adenomas, multiple biopsies. Needs surveillance in Aug 2016 due to poor prep  . CORONARY ARTERY BYPASS GRAFT     x5  . CORONARY ARTERY BYPASS GRAFT   2011  . CYSTOSCOPY N/A 12/12/2012   Procedure: CYSTOSCOPY FLEXIBLE;  Surgeon: Marissa Nestle, MD;  Location: [facility] ORS;  Service: Urology;  Laterality: N/A;  . ESOPHAGOGASTRODUODENOSCOPY N/A 08/16/2013   Dr. [name]: normal esophagus s/p Maloney dilation, gastric erosions, submucosal gastric mass vs extrinsic mass  . EUS N/A 09/07/2013   Dr. 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 INCISION AND DRAINAGE ABSCESS N/A 12/20/2013   Procedure: INCISION AND DRAINAGE ABSCESS NECK;  Surgeon: Jamesetta So, MD;  Location: [facility] ORS;  Service: General;  Laterality: N/A;  . Venia Minks DILATION N/A 08/16/2013   Procedure: Keturah Shavers;  Surgeon: Daneil Dolin, MD;  Location: [facility] ENDO SUITE;  Service: Endoscopy;  Laterality: N/A;       Home Medications    Prior to Admission medications   Medication Sig Start Date End Date Taking? Authorizing Provider  amLODipine-olmesartan (AZOR) 5-40 MG tablet Take 1 tablet by mouth daily.   Yes [provider]  apixaban (ELIQUIS) 5 MG TABS tablet Take 5 mg by mouth 2 (two) times daily.   Yes [provider]  cholecalciferol (VITAMIN D) 1000 units tablet Take 5,000 Units by mouth daily.   Yes [provider]  clindamycin (CLEOCIN) 300 MG capsule Take 1 capsule (300 mg total) by mouth every 8 (eight) hours. 08/27/16  Yes [name], MD  cloNIDine (CATAPRES) 0.1 MG tablet Take 0.1 mg by mouth 2 (two) times daily.   Yes [provider]  docusate sodium (COLACE) 100 MG capsule Take 100 mg by mouth 2 (two) times daily.   Yes [provider]  furosemide (LASIX) 20 MG tablet TAKE 20 MG IN THE AM AND ALTERNATE 20 MG IN THE EVENING 07/09/16  Yes Lendon Colonel, NP  insulin glargine (LANTUS) 100 UNIT/ML injection Inject 0.5 mLs (50 Units total) into the skin at bedtime. 06/15/16  Yes Dondiego, Richard, MD  ipratropium-albuterol (DUONEB) 0.5-2.5 (3) MG/3ML SOLN Take 3 mLs by nebulization every 6 (six) hours as  needed (wheezing).   Yes [provider]  metoprolol succinate (TOPROL-XL) 25 MG 24 hr tablet Take 3 tablets (75 mg total) by mouth daily. Take with or immediately following a meal. 06/16/16  Yes Dondiego, Richard, MD  nitroGLYCERIN (NITROSTAT) 0.4 MG SL tablet Place 1 tablet (0.4 mg total) under the tongue every 5 (five) minutes as needed for chest pain. 12/22/13  Yes Dondiego, Delfino Lovett, MD  oxyCODONE (ROXICODONE) 15 MG immediate release tablet Take 30 mg by mouth every 8 (eight) hours as needed for pain.    Yes [provider]  pantoprazole (PROTONIX) 40 MG tablet Take 40 mg by mouth daily.   Yes [provider]  rosuvastatin (CRESTOR) 10 MG tablet Take 10 mg by mouth daily.   Yes [provider]  silodosin (RAPAFLO) 8 MG CAPS capsule Take 8 mg by mouth daily with breakfast.   Yes  [provider]  timolol (BETIMOL) 0.5 % ophthalmic solution Place 1 drop into the left eye daily.   Yes [provider]    Family History Family History  Problem Relation Age of Onset  . Colon cancer Son 70       deceased    Social History Social History  Substance Use Topics  . Smoking status: Former Smoker    Packs/day: 1.00    Years: 11.00    Quit date: 01/05/1956  . Smokeless tobacco: Never Used  . Alcohol use No     Allergies   Patient has no known allergies.   Review of Systems Review of Systems  Constitutional: Negative for appetite change and fever.  HENT: Positive for trouble swallowing. Negative for congestion.   Eyes: Negative for redness.  Respiratory: Negative for shortness of breath.   Cardiovascular: Positive for chest pain.  Gastrointestinal: Positive for nausea and vomiting. Negative for abdominal pain.  Genitourinary: Negative for dysuria.  Musculoskeletal: Negative for back pain.  Skin: Positive for wound.  Neurological: Negative for headaches.  Hematological: Does not bruise/bleed easily.  Psychiatric/Behavioral:  Positive for confusion.     Physical Exam Updated Vital Signs BP (!) 144/64   Pulse 85   Temp 97.7 F (36.5 C) (Oral)   Resp 15   Ht 1.791 m (5' 10.5")   Wt 98 kg (216 lb)   SpO2 100%   BMI 30.55 kg/m   Physical Exam  Constitutional: He is oriented to person, place, and time. He appears well-developed and well-nourished. He appears distressed.  HENT:  Head: Normocephalic and atraumatic.  Mucous membranes dry.  Eyes: Pupils are equal, round, and reactive to light. EOM are normal.  Neck: Normal range of motion. Neck supple.  Cardiovascular: Normal rate, regular rhythm and normal heart sounds.   Pulmonary/Chest: Effort normal and breath sounds normal. No respiratory distress. He exhibits no tenderness.  Abdominal: Soft. Bowel sounds are normal.  Musculoskeletal: Normal range of motion. He exhibits edema.  Left foot with marked erythema. Also evidence of a closed puncture wound on the bottom of the foot around the third and fourth metatarsal area. At the top of the foot right at the base of the third and fourth metatarsal there is a 1 cm area that may represent pus underneath the skin. Patient with good cap refill. Not significantly tender. Right foot also has good cap refill.in addition the redness does spread up into the distal part of the leg.  Neurological: He is alert and oriented to person, place, and time.  Skin: Skin is warm. There is erythema.  Nursing note and vitals reviewed.    ED Treatments / Results  Labs (all labs ordered are listed, but only abnormal results are displayed) Labs Reviewed  COMPREHENSIVE METABOLIC PANEL - Abnormal; Notable for the following:       Result Value   Sodium 132 (*)    Chloride 93 (*)    Glucose, Bld 423 (*)    BUN 31 (*)    Creatinine, Ser 1.49 (*)    Calcium 10.5 (*)    Albumin 3.1 (*)    Alkaline Phosphatase 201 (*)    GFR calc non Af Amer 42 (*)    GFR calc Af Amer 48 (*)    All other components within normal limits  CBC -  Abnormal; Notable for the following:    WBC 28.7 (*)    Hemoglobin 11.8 (*)    HCT 37.1 (*)    All  other components within normal limits  CBG MONITORING, ED - Abnormal; Notable for the following:    Glucose-Capillary 435 (*)    All other components within normal limits  CBG MONITORING, ED - Abnormal; Notable for the following:    Glucose-Capillary 366 (*)    All other components within normal limits  CULTURE, BLOOD (ROUTINE X 2)  CULTURE, BLOOD (ROUTINE X 2)  LIPASE, BLOOD  LACTIC ACID, PLASMA  LACTIC ACID, PLASMA  URINALYSIS, ROUTINE W REFLEX MICROSCOPIC    EKG  EKG Interpretation  Date/Time:  Tuesday September 01 2016 10:42:10 EDT Ventricular Rate:  95 PR Interval:    QRS Duration: 82 QT Interval:  340 QTC Calculation: 427 R Axis:   69 Text Interpretation:  Atrial fibrillation with premature ventricular or aberrantly conducted complexes Abnormal ECG Confirmed by Fredia Sorrow [phone]) on 09/01/2016 11:10:48 AM       Radiology Dg Chest 2 View  Result Date: 09/01/2016 CLINICAL DATA:  Generalized abdominal and chest pain. EXAM: CHEST  2 VIEW COMPARISON:  08/27/2016. FINDINGS: Stable cardiomegaly with aortic atherosclerosis. Status post CABG. Mild interstitial edema with atelectasis at the lung bases. No pneumonic consolidation, effusion or pneumothorax. No acute osseous abnormality. IMPRESSION: 1. Stable cardiomegaly with aortic atherosclerosis and post CABG change. 2. Bibasilar atelectasis with minimal interstitial edema. Electronically Signed   By: Ashley Royalty M.D.   On: 09/01/2016 14:28   Dg Foot Complete Left  Result Date: 09/01/2016 CLINICAL DATA:  Left foot redness. EXAM: LEFT FOOT - COMPLETE 3+ VIEW COMPARISON:  08/20/2016 FINDINGS: There is no evidence of fracture or dislocation. There is no periosteal reaction or bone destruction. There soft tissue emphysema between the third and fourth metatarsals concerning for infection. IMPRESSION: 1. No evidence of osteomyelitis of  the left foot. 2. Soft tissue emphysema between the third and fourth metatarsals concerning for necrotizing infection. Electronically Signed   By: Kathreen Devoid   On: 09/01/2016 14:29    Procedures Procedures (including critical care time)  Medications Ordered in ED Medications  0.9 %  sodium chloride infusion ( Intravenous Stopped 09/01/16 1533)  ondansetron (ZOFRAN) injection 4 mg (4 mg Intravenous Given 09/01/16 1100)  ondansetron (ZOFRAN) 4 MG/2ML injection ( Intravenous Given by Other 09/01/16 1130)  sodium chloride 0.9 % bolus 500 mL (0 mLs Intravenous Stopped 09/01/16 1244)  vancomycin (VANCOCIN) IVPB 1000 mg/200 mL premix (0 mg Intravenous Stopped 09/01/16 1317)  insulin aspart (novoLOG) injection 10 Units (10 Units Subcutaneous Given 09/01/16 1210)  gi cocktail (Maalox,Lidocaine,Donnatal) (30 mLs Oral Given 09/01/16 1536)     Initial Impression / Assessment and Plan / ED Course  I have reviewed the triage vital signs and the nursing notes.  Pertinent labs & imaging results that were available during my care of the patient were reviewed by me and considered in my medical decision making (see chart for details).     Discussed with Dr. Arnoldo Morale from general surgery about the infection in the left foot. He will consult on the patient. Also discussed with hospitalist Dr. Inda Merlin. Patient will be admitted medical, will continue antibiotics. And will have his sugars brought under control. Patient showing signs of improvement here with vancomycin. And some fluids. Workup here shows lactic acid to be normal. Had a marked leukocytosis. Blood sugar was initially in the 400s now down to 366 patient given 10 units of IV insulin. In addition patient given vancomycin 1 g. Blood cultures were done. Did not specifically meet sepsis criteria, is not hypotensive to have an  elevated lactic acid. And the tachycardia had was easily resolved with some fluids.  Do not feel the patient is septic at this time. Did  have some tachycardia when he first arrived that has now improved. No hypotension. Patient's other complaint was burning in the esophagus. He has had some nausea and vomiting. This got worse with vomiting. Feel that this just irritation of the esophagus. Do not think there is an acute coronary event ongoing. Patient given a GI cocktail.   Examination of the left foot shows increased cellulitis. Also suspicious for probably being a pus in between the third and fourth metatarsal. May require I&D by general surgery.  Final Clinical Impressions(s) / ED Diagnoses   Final diagnoses:  Left foot infection  Hyperglycemia    New Prescriptions New Prescriptions   No medications on file     Fredia Sorrow, MD 09/01/16 [phone]

## 2016-09-01 NOTE — ED Triage Notes (Signed)
Pt comes in with generalized abdominal pain  And left foot pain. Pt was seen here for his foot on 8/16. Pt was seen by his PCP yesterday for abdominal pain but doesn't remember what was said.

## 2016-09-02 ENCOUNTER — Encounter (HOSPITAL_COMMUNITY): Payer: Medicare Other

## 2016-09-02 ENCOUNTER — Inpatient Hospital Stay (HOSPITAL_COMMUNITY): Payer: Medicare Other

## 2016-09-02 LAB — CBC
HEMATOCRIT: 34.2 % — AB (ref 39.0–52.0)
Hemoglobin: 10.9 g/dL — ABNORMAL LOW (ref 13.0–17.0)
MCH: 27.6 pg (ref 26.0–34.0)
MCHC: 31.9 g/dL (ref 30.0–36.0)
MCV: 86.6 fL (ref 78.0–100.0)
PLATELETS: 299 10*3/uL (ref 150–400)
RBC: 3.95 MIL/uL — ABNORMAL LOW (ref 4.22–5.81)
RDW: 13.7 % (ref 11.5–15.5)
WBC: 24.4 10*3/uL — AB (ref 4.0–10.5)

## 2016-09-02 LAB — BASIC METABOLIC PANEL
Anion gap: 11 (ref 5–15)
BUN: 31 mg/dL — AB (ref 6–20)
CHLORIDE: 99 mmol/L — AB (ref 101–111)
CO2: 25 mmol/L (ref 22–32)
CREATININE: 1.34 mg/dL — AB (ref 0.61–1.24)
Calcium: 10 mg/dL (ref 8.9–10.3)
GFR calc Af Amer: 55 mL/min — ABNORMAL LOW (ref >60)
GFR calc non Af Amer: 47 mL/min — ABNORMAL LOW (ref >60)
GLUCOSE: 296 mg/dL — AB (ref 65–99)
Potassium: 4.3 mmol/L (ref 3.5–5.1)
SODIUM: 135 mmol/L (ref 135–145)

## 2016-09-02 LAB — GLUCOSE, CAPILLARY
GLUCOSE-CAPILLARY: 127 mg/dL — AB (ref 65–99)
Glucose-Capillary: 282 mg/dL — ABNORMAL HIGH (ref 65–99)
Glucose-Capillary: 273 mg/dL — ABNORMAL HIGH (ref 65–99)
Glucose-Capillary: 95 mg/dL (ref 65–99)

## 2016-09-02 LAB — C-REACTIVE PROTEIN: CRP: 13.3 mg/dL — ABNORMAL HIGH (ref <1.0)

## 2016-09-02 LAB — PREALBUMIN: PREALBUMIN: 9.5 mg/dL — AB (ref 18–38)

## 2016-09-02 MED ORDER — GLUCERNA SHAKE PO LIQD
237.0000 mL | Freq: Three times a day (TID) | ORAL | Status: DC
  Administered 2016-09-02 – 2016-09-08 (×11): 237 mL via ORAL

## 2016-09-02 MED ORDER — ORAL CARE MOUTH RINSE
15.0000 mL | Freq: Two times a day (BID) | OROMUCOSAL | Status: DC
  Administered 2016-09-02 – 2016-09-08 (×7): 15 mL via OROMUCOSAL

## 2016-09-02 NOTE — Consult Note (Signed)
[facility] Nurse wound consult note Reason for Consult: Plantar foot wound.  Patient has already been seen by Surgery (Dr. Arnoldo Morale) and he will follow. Left foot films show gas present and no osteomyelitis. Patient's family members (2) state and corroborate that patient "stepped on a bolt (rusty and broken) approximately 10 days ago" and that it "went through his shoe and into his foot." Wound type: Traumatic foot injury in patient with diabetes Pressure Injury POA: N/A Measurement: Left foot plantar aspect: 4cm x 1cm area of discoloration with puncture wound in center measuring 0.2cm round over 4th met head. Anterior foot with erythema, edema, but with little warmth. Area of erythema outlined today.  A 1cm round area of white discoloration presents at the base of the 4th digit. This is not fluid-filled and not firm. Wound bed: As described above Drainage (amount, consistency, odor) None Periwound: As described above Dressing procedure/placement/frequency: No dressing is required. Patient is being followed by Dr. Mickeal Needy and his oversight is appreciated.  I will provide a pressure redistribution heel boot to protect the foot, prevent pressure injury and still allow for continued observation.  [facility] nursing team will not follow, but will remain available to this patient, the nursing and medical teams.  Please re-consult if needed. Thanks, Maudie Flakes, MSN, RN, [place], Arther Abbott  Pager# [phone]

## 2016-09-02 NOTE — Progress Notes (Signed)
At approximately 2130, Patient c/o SOB and choking. VS per flow sheet. SpO2 99-100% on O2 2L. Patient instructed on relaxation techniques. Patient given jello since meds had already been given and brother stated that patient has difficulty swallowing at times. RT called for breathing tx. Patient resting in bed with eyes closed at this time. Will continue to monitor.

## 2016-09-02 NOTE — Evaluation (Signed)
Physical Therapy Evaluation Patient Details Name: [name] MRN: [id number] DOB: 02-03-1931 Today's Date: 09/02/2016   History of Present Illness  [name] is a 83 y.o. male with medical history significant of dCHF, COPD, CKD 3, HTN, CAD, DM2, HLD, BPH who was hospitalized from 8/16 - 23 for a diabetic foot infection; he was discharged on Clindamycin.  Since discharge, he has had esophageal pain, n/v, weakness, and has been unable to eat.  He did not feel good when he left, unable to stand up.  He refused SNF rehab with last discharge; he now recognizes that this might be necessary.  +fever, subjective; + chills.  This started after he stepped on a bolt - he reports that it was NOT better at the time of last discharge.  He has continued to go "downhill:" since he was admitted last time.  He reports that he did take Clinda following last hospitalization.      Clinical Impression  Patient demonstrates slow labored movement and limited to a few steps at bedside Max assist using RW with knees buckling due to weakness.  Patient limited for functional mobility due to fatigue, poor standing balance and BLE weakness.  Patient will benefit from continued physical therapy in hospital and recommended venue below to increase strength, balance, endurance for safe ADLs and gait.  Patient states he has electric scooter at home but to big to fit in his home and request manual wheelchair.    Patient suffers from CHF, COPD which impairs their ability to perform daily activities like walking and performing ADLs in the home.  A walker alone will not resolve the issues with performing activities of daily living. A wheelchair will allow patient to safely perform daily activities.  The patient can self propel in the home or has a caregiver who can provide assistance.       Follow Up Recommendations SNF    Equipment Recommendations  Wheelchair (measurements PT)    Recommendations for Other Services        Precautions / Restrictions Precautions Precautions: Fall Restrictions Weight Bearing Restrictions: No      Mobility  Bed Mobility Overal bed mobility: Needs Assistance Bed Mobility: Supine to Sit;Sit to Supine     Supine to sit: Mod assist Sit to supine: Mod assist      Transfers Overall transfer level: Needs assistance Equipment used: Rolling walker (2 wheeled) Transfers: Sit to/from Stand Sit to Stand: Mod assist         General transfer comment: very unsteady on feet  Ambulation/Gait Ambulation/Gait assistance: Max assist Ambulation Distance (Feet): 4 Feet Assistive device: Rolling walker (2 wheeled) Gait Pattern/deviations: Decreased step length - left;Decreased stance time - right;Decreased stance time - left;Decreased stride length;Decreased step length - right   Gait velocity interpretation: Below normal speed for age/gender General Gait Details: Limited to a few side steps due to knees buckling/weakness  Stairs            Wheelchair Mobility    Modified Rankin (Stroke Patients Only)       Balance Overall balance assessment: Needs assistance Sitting-balance support: Feet supported;Bilateral upper extremity supported Sitting balance-Leahy Scale: Fair     Standing balance support: Bilateral upper extremity supported;During functional activity Standing balance-Leahy Scale: Poor                               Pertinent Vitals/Pain Pain Assessment: 0-10 Pain Score: 8  Pain  Location: burning sensation in throat Pain Descriptors / Indicators: Burning Pain Intervention(s): Limited activity within patient's tolerance;Monitored during session    Home Living Family/patient expects to be discharged to:: Private residence Living Arrangements: Alone Available Help at Discharge: Available 24 hours/day;Friend(s) Type of Home: Mobile home Home Access: Stairs to enter Entrance Stairs-Rails: Can reach both   Home Layout: One level Home  Equipment: None      Prior Function Level of Independence: Independent         Comments: independent with ADLs, IADLs, ambulation without AD or assistance, still driving     Hand Dominance   Dominant Hand: Right    Extremity/Trunk Assessment   Upper Extremity Assessment Upper Extremity Assessment: Overall WFL for tasks assessed    Lower Extremity Assessment Lower Extremity Assessment: Generalized weakness    Cervical / Trunk Assessment Cervical / Trunk Assessment: Normal  Communication   Communication: No difficulties  Cognition Arousal/Alertness: Awake/alert Behavior During Therapy: WFL for tasks assessed/performed Overall Cognitive Status: Within Functional Limits for tasks assessed                                        General Comments      Exercises     Assessment/Plan    PT Assessment Patient needs continued PT services  PT Problem List Decreased strength;Decreased activity tolerance;Decreased balance;Decreased mobility       PT Treatment Interventions Gait training;Functional mobility training;Stair training;Therapeutic activities;Therapeutic exercise;Patient/family education    PT Goals (Current goals can be found in the Care Plan section)  Acute Rehab PT Goals Patient Stated Goal: return home after rehab PT Goal Formulation: With patient Time For Goal Achievement: 09/09/16 Potential to Achieve Goals: Good    Frequency Min 3X/week   Barriers to discharge        Co-evaluation               AM-PAC PT "6 Clicks" Daily Activity  Outcome Measure Difficulty turning over in bed (including adjusting bedclothes, sheets and blankets)?: Unable Difficulty moving from lying on back to sitting on the side of the bed? : Unable Difficulty sitting down on and standing up from a chair with arms (e.g., wheelchair, bedside commode, etc,.)?: Unable Help needed moving to and from a bed to chair (including a wheelchair)?: A Lot Help  needed walking in hospital room?: A Lot Help needed climbing 3-5 steps with a railing? : Total 6 Click Score: 8    End of Session Equipment Utilized During Treatment: Oxygen Activity Tolerance: Patient limited by fatigue;Patient limited by pain Patient left: in bed;with call bell/phone within reach;with bed alarm set;with family/visitor present Nurse Communication: Mobility status PT Visit Diagnosis: Unsteadiness on feet (R26.81);Other abnormalities of gait and mobility (R26.89);Muscle weakness (generalized) (M62.81)    Time: 3734-2876 PT Time Calculation (min) (ACUTE ONLY): 38 min   Charges:   PT Evaluation $PT Eval Low Complexity: 1 Low PT Treatments $Therapeutic Exercise: 23-37 mins   PT G Codes:        4:12 PM, 09/24/16 Lonell Grandchild, MPT Physical Therapist with [facility] 336 [phone] office [phone] mobile phone

## 2016-09-02 NOTE — Progress Notes (Signed)
Patient returns with left foot cellulitis not certain if he was on clindamycin for several days at home? Patient seen by surgery question of I and D entertained currently on antibiotics we will see how foot resolves renal function. He has normal systolic function hemodynamically stable [name] FWY:[id number] DOB: [birth year] DOA: 09/01/2016 PCP: [name], MD   Physical Exam: Blood pressure (!) 144/61, pulse 97, temperature 97.8 F (36.6 C), temperature source Oral, resp. rate 14, height 5' 10.5" (1.791 m), weight 93.6 kg (206 lb 4.8 oz), SpO2 100 %. Lungs diminished breath sounds in bases no rales wheeze rhonchi appreciable heart irregular rhythm no S3 no heaves thrills rubs left foot shows some rubor tenderness to compression   Investigations:  Recent Results (from the past 240 hour(s))  Blood culture (routine x 2)     Status: None (Preliminary result)   Collection Time: 09/01/16 11:00 AM  Result Value Ref Range Status   Specimen Description RIGHT ANTECUBITAL  Final   Special Requests   Final    BOTTLES DRAWN AEROBIC AND ANAEROBIC Blood Culture adequate volume   Culture NO GROWTH < 24 HOURS  Final   Report Status PENDING  Incomplete  Blood culture (routine x 2)     Status: None (Preliminary result)   Collection Time: 09/01/16 11:08 AM  Result Value Ref Range Status   Specimen Description BLOOD RIGHT WRIST  Final   Special Requests   Final    BOTTLES DRAWN AEROBIC AND ANAEROBIC Blood Culture adequate volume   Culture NO GROWTH < 24 HOURS  Final   Report Status PENDING  Incomplete     Basic Metabolic Panel:  Recent Labs  09/01/16 1100 09/02/16 0435  NA 132* 135  K 4.6 4.3  CL 93* 99*  CO2 25 25  GLUCOSE 423* 296*  BUN 31* 31*  CREATININE 1.49* 1.34*  CALCIUM 10.5* 10.0   Liver Function Tests:  Recent Labs  09/01/16 1100  AST 19  ALT 35  ALKPHOS 201*  BILITOT 1.1  PROT 7.4  ALBUMIN 3.1*     CBC:  Recent Labs  09/01/16 1100 09/02/16 0435  WBC  28.7* 24.4*  HGB 11.8* 10.9*  HCT 37.1* 34.2*  MCV 86.3 86.6  PLT 303 299    Dg Chest 2 View  Result Date: 09/01/2016 CLINICAL DATA:  Generalized abdominal and chest pain. EXAM: CHEST  2 VIEW COMPARISON:  08/27/2016. FINDINGS: Stable cardiomegaly with aortic atherosclerosis. Status post CABG. Mild interstitial edema with atelectasis at the lung bases. No pneumonic consolidation, effusion or pneumothorax. No acute osseous abnormality. IMPRESSION: 1. Stable cardiomegaly with aortic atherosclerosis and post CABG change. 2. Bibasilar atelectasis with minimal interstitial edema. Electronically Signed   By: Ashley Royalty M.D.   On: 09/01/2016 14:28   US Arterial Seg Single  Result Date: 09/02/2016 CLINICAL DATA:  83 year old male with left foot puncture wound and cellulitis. EXAM: NONINVASIVE PHYSIOLOGIC VASCULAR STUDY OF BILATERAL LOWER EXTREMITIES TECHNIQUE: Evaluation of both lower extremities was performed at rest, including calculation of ankle-brachial indices, multiple segmental pressure evaluation, segmental Doppler and segmental pulse volume recording. COMPARISON:  None. FINDINGS: Right ABI:  1.0 Left ABI:  1.2 Right Lower Extremity: Normal posterior tibial arterial waveform. The dorsalis pedis waveform is slightly abnormal suggesting an element of anterior tibial or small vessel disease. Left Lower Extremity: Normal posterior tibial waveform. The dorsalis pedis waveform is slightly abnormal suggesting anterior tibial or small vessel disease. IMPRESSION: Normal bilateral resting ankle-brachial indices. Signed, Criselda Peaches, MD [facility] Electronically Signed   By: Jacqulynn Cadet M.D.   On: 09/02/2016 11:10   Dg Foot Complete Left  Result Date: 09/01/2016 CLINICAL DATA:  Left foot redness. EXAM: LEFT FOOT - COMPLETE 3+ VIEW COMPARISON:  08/20/2016 FINDINGS: There is no evidence of fracture or dislocation. There is no periosteal  reaction or bone destruction. There soft tissue emphysema between the third and fourth metatarsals concerning for infection. IMPRESSION: 1. No evidence of osteomyelitis of the left foot. 2. Soft tissue emphysema between the third and fourth metatarsals concerning for necrotizing infection. Electronically Signed   By: Kathreen Devoid   On: 09/01/2016 14:29      Medications  Impression:  Principal Problem:   Diabetic foot infection ([place]) Active Problems:   Essential hypertension   Nausea and vomiting   Diabetes ([place])   CKD (chronic kidney disease), stage III   Esophageal stricture   Atrial fibrillation with normal ventricular rate (HCC)     Plan: Continue antibiotics hold all questions use employee Lovenox in the interim for possible surgery IND  Consultants: General surgery   Procedures   Antibiotics: Clindamycin 900 mg 3 times a day, Zosyn IV and vancomycin           Time spent: 30 minutes   LOS: 1 day   Chyrel Taha M   09/02/2016, 12:26 PM

## 2016-09-02 NOTE — Care Management Note (Addendum)
Case Management Note  Patient Details  Name: [name] MRN: [id number] Date of Birth: Feb 27, 1931  Subjective/Objective:                  Adm with foot infection. From home alone, has good family/friend support. Recent admission, treated for foot cellulitis. Recommended for SNF at that time,  but declined. DC'sdhome with home health with [facility] and oxygen with [facility]. Surgical consult, per note patient may require I & D.   Action/Plan: CM following for needs. PT eval pending. If DC's home, patient will need HH resumption orders.   Expected Discharge Date:    09/04/2016           Expected Discharge Plan:     In-House Referral:  Clinical Social Work  Discharge planning Services  CM Consult  Post Acute Care Choice:    Choice offered to:     DME Arranged:    DME Agency:     HH Arranged:    HH Agency:     Status of Service:  In process, will continue to follow  If discussed at Long Length of Stay Meetings, dates discussed:    Additional Comments:  Lanora Reveron, Chauncey Reading, RN 09/02/2016, 10:47 AM

## 2016-09-02 NOTE — Consult Note (Signed)
Reason for Consult: Cellulitis, left foot Referring Physician: [name] is an 83 y.o. male.  HPI: Patient is an 83 year old white male with multiple medical problems who was recently discharged from the hospital after being treated for a puncture wound to the left foot. Patient is a poor historian. He states he has been taking all the pills that were prescribed, but there is some question whether he took the antibiotic pills. He states his left foot is swollen and sore, but complains more of chest pain.  Past Medical History:  Diagnosis Date  . BPH (benign prostatic hyperplasia)   . CAD (coronary artery disease)    Multivessel status post CABG 2011 - LIMA to LAD, SVG to diagonal, SVG to OM, SVG to PDA  . Cellulitis    12/15  . Chronic back pain   . Chronic diastolic CHF (congestive heart failure) ([place])   . CKD (chronic kidney disease), stage III   . COPD (chronic obstructive pulmonary disease) ([place])   . Essential hypertension   . Gastric mass    EGD 9/15  . GERD (gastroesophageal reflux disease)   . History of DVT (deep vein thrombosis)    Postphlebitic syndrome  . History of kidney stones   . HOH (hard of hearing)   . Hx of CABG   . Hyperlipidemia   . Persistent atrial fibrillation ([place])    a. s/p DCCV 03/2016.  [name] Sleep apnea    Stop Bang score of 5  . Type 2 diabetes mellitus ([place])     Past Surgical History:  Procedure Laterality Date  . CARDIOVERSION N/A 03/20/2016   Procedure: CARDIOVERSION;  Surgeon: Phillip Lenis, MD;  Location[facility];  Service: Endoscopy;  Laterality: N/A;  . CATARACT EXTRACTION W/PHACO Left 02/07/2016   Procedure: CATARACT EXTRACTION PHACO AND INTRAOCULAR LENS PLACEMENT (IOC);  Surgeon: Phillip Goldmann, MD;  Location: [facility];  Service: Ophthalmology;  Laterality: Left;  CDE:  23.13  . COLONOSCOPY  2004   Dr. Laural Duncan: three small polyps at cecum, path unknown, external hemorrhoids  . COLONOSCOPY N/A 08/16/2013   Dr. Gala Duncan:  incomplete prep. multiple tubular adenomas, multiple biopsies. Needs surveillance in Aug 2016 due to poor prep  . CORONARY ARTERY BYPASS GRAFT     x5  . CORONARY ARTERY BYPASS GRAFT  2011  . CYSTOSCOPY N/A 12/12/2012   Procedure: CYSTOSCOPY FLEXIBLE;  Surgeon: Phillip Nestle, MD;  Location: [facility];  Service: Urology;  Laterality: N/A;  . ESOPHAGOGASTRODUODENOSCOPY N/A 08/16/2013   Dr. Gala Duncan: normal esophagus s/p Maloney dilation, gastric erosions, submucosal gastric mass vs extrinsic mass  . EUS N/A 09/07/2013   Dr. 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 INCISION AND DRAINAGE ABSCESS N/A 12/20/2013   Procedure: INCISION AND DRAINAGE ABSCESS NECK;  Surgeon: Phillip So, MD;  Location: [facility];  Service: General;  Laterality: N/A;  . Venia Minks DILATION N/A 08/16/2013   Procedure: [name];  Surgeon: Phillip Dolin, MD;  Location[facility];  Service: Endoscopy;  Laterality: N/A;    Family History  Problem Relation Age of Onset  . Colon cancer Son 88       deceased    Social History:  reports that he quit smoking about 60 years ago. He has a 11.00 pack-year smoking history. He has never used smokeless tobacco. He reports that he does not drink alcohol or use drugs.  Allergies: No Known Allergies  Medications:  Scheduled: .  cloNIDine  0.1 mg Oral BID  . docusate sodium  100 mg Oral BID  . enoxaparin (LOVENOX) injection  95 mg Subcutaneous Q12H  . insulin aspart  0-20 Units Subcutaneous TID WC  . insulin aspart  0-5 Units Subcutaneous QHS  . insulin glargine  50 Units Subcutaneous QHS  . mouth rinse  15 mL Mouth Rinse BID  . metoprolol succinate  75 mg Oral Daily  . pantoprazole  40 mg Oral Daily  . rosuvastatin  10 mg Oral Daily  . sucralfate  1 g Oral TID WC & HS  . tamsulosin  0.4 mg Oral QPC breakfast  . timolol  1 drop Left Eye Daily    Results for orders placed or performed during the hospital encounter of 09/01/16 (from the past 48  hour(s))  CBG monitoring, ED     Status: Abnormal   Collection Time: 09/01/16 10:36 AM  Result Value Ref Range   Glucose-Capillary 435 (H) 65 - 99 mg/dL  Lipase, blood     Status: None   Collection Time: 09/01/16 11:00 AM  Result Value Ref Range   Lipase 26 11 - 51 U/L  Comprehensive metabolic panel     Status: Abnormal   Collection Time: 09/01/16 11:00 AM  Result Value Ref Range   Sodium 132 (L) 135 - 145 mmol/L   Potassium 4.6 3.5 - 5.1 mmol/L   Chloride 93 (L) 101 - 111 mmol/L   CO2 25 22 - 32 mmol/L   Glucose, Bld 423 (H) 65 - 99 mg/dL   BUN 31 (H) 6 - 20 mg/dL   Creatinine, Ser 1.49 (H) 0.61 - 1.24 mg/dL   Calcium 10.5 (H) 8.9 - 10.3 mg/dL   Total Protein 7.4 6.5 - 8.1 g/dL   Albumin 3.1 (L) 3.5 - 5.0 g/dL   AST 19 15 - 41 U/L   ALT 35 17 - 63 U/L   Alkaline Phosphatase 201 (H) 38 - 126 U/L   Total Bilirubin 1.1 0.3 - 1.2 mg/dL   GFR calc non Af Amer 42 (L) >60 mL/min   GFR calc Af Amer 48 (L) >60 mL/min    Comment: (NOTE) The eGFR has been calculated using the CKD EPI equation. This calculation has not been validated in all clinical situations. eGFR's persistently <60 mL/min signify possible Chronic Kidney Disease.    Anion gap 14 5 - 15  CBC     Status: Abnormal   Collection Time: 09/01/16 11:00 AM  Result Value Ref Range   WBC 28.7 (H) 4.0 - 10.5 K/uL    Comment: WHITE COUNT CONFIRMED ON SMEAR   RBC 4.30 4.22 - 5.81 MIL/uL   Hemoglobin 11.8 (L) 13.0 - 17.0 g/dL   HCT 37.1 (L) 39.0 - 52.0 %   MCV 86.3 78.0 - 100.0 fL   MCH 27.4 26.0 - 34.0 pg   MCHC 31.8 30.0 - 36.0 g/dL   RDW 13.5 11.5 - 15.5 %   Platelets 303 150 - 400 K/uL  Blood culture (routine x 2)     Status: None (Preliminary result)   Collection Time: 09/01/16 11:00 AM  Result Value Ref Range   Specimen Description RIGHT ANTECUBITAL    Special Requests      BOTTLES DRAWN AEROBIC AND ANAEROBIC Blood Culture adequate volume   Culture NO GROWTH < 24 HOURS    Report Status PENDING   Lactic acid,  plasma     Status: None   Collection Time: 09/01/16 11:02 AM  Result Value  Ref Range   Lactic Acid, Venous 1.4 0.5 - 1.9 mmol/L  Blood culture (routine x 2)     Status: None (Preliminary result)   Collection Time: 09/01/16 11:08 AM  Result Value Ref Range   Specimen Description BLOOD RIGHT WRIST    Special Requests      BOTTLES DRAWN AEROBIC AND ANAEROBIC Blood Culture adequate volume   Culture NO GROWTH < 24 HOURS    Report Status PENDING   Lactic acid, plasma     Status: None   Collection Time: 09/01/16  2:08 PM  Result Value Ref Range   Lactic Acid, Venous 1.1 0.5 - 1.9 mmol/L  C-reactive protein     Status: Abnormal   Collection Time: 09/01/16  2:19 PM  Result Value Ref Range   CRP 13.3 (H) <1.0 mg/dL    Comment: Performed at [facility], [place] [address], [place], [place] [address]  Prealbumin     Status: Abnormal   Collection Time: 09/01/16  2:19 PM  Result Value Ref Range   Prealbumin 9.5 (L) 18 - 38 mg/dL    Comment: Performed at [facility] [address], [place], [place] [address]  CBG monitoring, ED     Status: Abnormal   Collection Time: 09/01/16  3:15 PM  Result Value Ref Range   Glucose-Capillary 366 (H) 65 - 99 mg/dL   Comment 1 Notify RN   Urinalysis, Routine w reflex microscopic     Status: Abnormal   Collection Time: 09/01/16  3:45 PM  Result Value Ref Range   Color, Urine YELLOW YELLOW   APPearance HAZY (A) CLEAR   Specific Gravity, Urine 1.012 1.005 - 1.030   pH 5.0 5.0 - 8.0   Glucose, UA >=500 (A) NEGATIVE mg/dL   Hgb urine dipstick SMALL (A) NEGATIVE   Bilirubin Urine NEGATIVE NEGATIVE   Ketones, ur 5 (A) NEGATIVE mg/dL   Protein, ur 100 (A) NEGATIVE mg/dL   Nitrite NEGATIVE NEGATIVE   Leukocytes, UA NEGATIVE NEGATIVE   RBC / HPF 0-5 0 - 5 RBC/hpf   WBC, UA 0-5 0 - 5 WBC/hpf   Bacteria, UA RARE (A) NONE SEEN   Squamous Epithelial / LPF 0-5 (A) NONE SEEN   Mucus PRESENT   Sedimentation rate     Status: Abnormal   Collection Time:  09/01/16  8:35 PM  Result Value Ref Range   Sed Rate 85 (H) 0 - 16 mm/hr  Glucose, capillary     Status: Abnormal   Collection Time: 09/01/16  9:29 PM  Result Value Ref Range   Glucose-Capillary 323 (H) 65 - 99 mg/dL  Basic metabolic panel     Status: Abnormal   Collection Time: 09/02/16  4:35 AM  Result Value Ref Range   Sodium 135 135 - 145 mmol/L   Potassium 4.3 3.5 - 5.1 mmol/L   Chloride 99 (L) 101 - 111 mmol/L   CO2 25 22 - 32 mmol/L   Glucose, Bld 296 (H) 65 - 99 mg/dL   BUN 31 (H) 6 - 20 mg/dL   Creatinine, Ser 1.34 (H) 0.61 - 1.24 mg/dL   Calcium 10.0 8.9 - 10.3 mg/dL   GFR calc non Af Amer 47 (L) >60 mL/min   GFR calc Af Amer 55 (L) >60 mL/min    Comment: (NOTE) The eGFR has been calculated using the CKD EPI equation. This calculation has not been validated in all clinical situations. eGFR's persistently <60 mL/min signify possible Chronic Kidney Disease.  Anion gap 11 5 - 15  CBC     Status: Abnormal   Collection Time: 09/02/16  4:35 AM  Result Value Ref Range   WBC 24.4 (H) 4.0 - 10.5 K/uL   RBC 3.95 (L) 4.22 - 5.81 MIL/uL   Hemoglobin 10.9 (L) 13.0 - 17.0 g/dL   HCT 34.2 (L) 39.0 - 52.0 %   MCV 86.6 78.0 - 100.0 fL   MCH 27.6 26.0 - 34.0 pg   MCHC 31.9 30.0 - 36.0 g/dL   RDW 13.7 11.5 - 15.5 %   Platelets 299 150 - 400 K/uL  Glucose, capillary     Status: Abnormal   Collection Time: 09/02/16  7:37 AM  Result Value Ref Range   Glucose-Capillary 282 (H) 65 - 99 mg/dL   Comment 1 Notify RN    Comment 2 Document in Chart     Dg Chest 2 View  Result Date: 09/01/2016 CLINICAL DATA:  Generalized abdominal and chest pain. EXAM: CHEST  2 VIEW COMPARISON:  08/27/2016. FINDINGS: Stable cardiomegaly with aortic atherosclerosis. Status post CABG. Mild interstitial edema with atelectasis at the lung bases. No pneumonic consolidation, effusion or pneumothorax. No acute osseous abnormality. IMPRESSION: 1. Stable cardiomegaly with aortic atherosclerosis and post CABG  change. 2. Bibasilar atelectasis with minimal interstitial edema. Electronically Signed   By: Ashley Royalty M.D.   On: 09/01/2016 14:28   Dg Foot Complete Left  Result Date: 09/01/2016 CLINICAL DATA:  Left foot redness. EXAM: LEFT FOOT - COMPLETE 3+ VIEW COMPARISON:  08/20/2016 FINDINGS: There is no evidence of fracture or dislocation. There is no periosteal reaction or bone destruction. There soft tissue emphysema between the third and fourth metatarsals concerning for infection. IMPRESSION: 1. No evidence of osteomyelitis of the left foot. 2. Soft tissue emphysema between the third and fourth metatarsals concerning for necrotizing infection. Electronically Signed   By: Kathreen Devoid   On: 09/01/2016 14:29    ROS:  Review of systems not obtained due to patient factors.  Blood pressure (!) 144/61, pulse 97, temperature 97.8 F (36.6 C), temperature source Oral, resp. rate 14, height 5' 10.5" (1.791 m), weight 206 lb 4.8 oz (93.6 kg), SpO2 100 %. Physical Exam: Pleasant white male in no acute distress. Head is normocephalic, atraumatic Heart examination reveals a regular rate and rhythm at the present time. No S3 or S4 noted. Extremity examination reveals bilateral femoral pulses. The left foot is swollen with some ischemic changes in a linear fashion dorsally and along the sole of the foot. No purulent drainage is noted. I could not appreciate crepitus. I could not palpate a dorsalis pedis pulse due to the swelling. He denies any pain.  Left foot x-rays reveal no osteomyelitis. Gas is present.  Assessment/Plan: Impression: Cellulitis of left foot with gas present. Leukocytosis slowly resolving. Patient reportedly has been taking his Eliquis. Uncontrolled diabetes mellitus Plan: Agree with antibiotic selection. Agree with switching to Lovenox. May need incision and drainage. Will follow closely with you. Chest pain is being addressed.  Aviva Signs 09/02/2016, 8:29 AM

## 2016-09-02 NOTE — Progress Notes (Signed)
Initial Nutrition Assessment  DOCUMENTATION CODES:   Not applicable  INTERVENTION:  - Provide Glucerna Shake po TID, each supplement provides 220 kcal and 10 grams of protein  NUTRITION DIAGNOSIS:   Increased nutrient needs related to wound healing as evidenced by estimated needs.  GOAL:   Patient will meet greater than or equal to 90% of their needs  MONITOR:   PO intake, Supplement acceptance, Weight trends, I & O's  REASON FOR ASSESSMENT:   Consult Wound healing  ASSESSMENT:   Pt with PMH of GERD, COPD, CHF, CKD Stage III, BPH, and CAD with recent hospitalization (08/20/16-08/27/16) presents with continued diabetic food infection S/P stepping on a rusty nail  Pt poor historian, unable to obtain extensive nutritional history.  Pt unsure of weight status or changes. Per chart review it seems as though pt has a downward trend in weight status.  No meal completion records available for pt this admission. However pt reports esophageal pain and reduced PO intake.   Promoted PO of nutritional supplements as pt admits liquids seem to be consumed easier.   Labs reviewed; CBG 282-435 Medications reviewed; Colace, Sliding scale insulin, Lantus, Protonix, Carafate  Nutrition focused physical exam completed. Findings include no fat or muscle depletions and moderate edema.  Diet Order:  Diet heart healthy/carb modified Room service appropriate? Yes; Fluid consistency: Thin  Skin:  Wound (see comment) (Diabetic foot infection)  Last BM:  08/31/16  Height:   Ht Readings from Last 1 Encounters:  09/01/16 5' 10.5" (1.791 m)    Weight:   Wt Readings from Last 1 Encounters:  09/02/16 206 lb 4.8 oz (93.6 kg)    Ideal Body Weight:  76.8 kg  BMI:  Body mass index is 29.18 kg/m.  Estimated Nutritional Needs:   Kcal:  2200-2400  Protein: 80-90 gr  Fluid:  1.8-2.0 liters daily  EDUCATION NEEDS:   Education needs no appropriate at this time  Parks Ranger,  RDN 09/02/2016 11:37 AM

## 2016-09-02 NOTE — Progress Notes (Signed)
ANTIBIOTIC CONSULT NOTE-Preliminary  Pharmacy Consult for zosyn Indication: diabetic foot/ necrotizing infeciton  No Known Allergies  Patient Measurements: Height: 5' 10.5" (179.1 cm) Weight: 204 lb 5.9 oz (92.7 kg) IBW/kg (Calculated) : 74.15 Adjusted Body Weight:   Vital Signs: Temp: 98.4 F (36.9 C) (08/28 2115) Temp Source: Oral (08/28 2115) BP: 143/71 (08/28 2115) Pulse Rate: 76 (08/28 2115)  Labs:  Recent Labs  09/01/16 1100  WBC 28.7*  HGB 11.8*  PLT 303  CREATININE 1.49*    Estimated Creatinine Clearance: 43.4 mL/min (A) (by C-G formula based on SCr of 1.49 mg/dL (H)).  No results for input(s): VANCOTROUGH, VANCOPEAK, VANCORANDOM, GENTTROUGH, GENTPEAK, GENTRANDOM, TOBRATROUGH, TOBRAPEAK, TOBRARND, AMIKACINPEAK, AMIKACINTROU, AMIKACIN in the last 72 hours.   Microbiology: Recent Results (from the past 720 hour(s))  Culture, blood (routine x 2)     Status: None   Collection Time: 08/20/16  3:51 PM  Result Value Ref Range Status   Specimen Description RIGHT ANTECUBITAL  Final   Special Requests   Final    BOTTLES DRAWN AEROBIC AND ANAEROBIC Blood Culture results may not be optimal due to an inadequate volume of blood received in culture bottles   Culture NO GROWTH 5 DAYS  Final   Report Status 08/25/2016 FINAL  Final  Culture, blood (routine x 2)     Status: None   Collection Time: 08/20/16  3:51 PM  Result Value Ref Range Status   Specimen Description BLOOD RIGHT HAND  Final   Special Requests   Final    BOTTLES DRAWN AEROBIC AND ANAEROBIC Blood Culture adequate volume   Culture NO GROWTH 5 DAYS  Final   Report Status 08/25/2016 FINAL  Final  Blood culture (routine x 2)     Status: None (Preliminary result)   Collection Time: 09/01/16 11:00 AM  Result Value Ref Range Status   Specimen Description RIGHT ANTECUBITAL  Final   Special Requests   Final    BOTTLES DRAWN AEROBIC AND ANAEROBIC Blood Culture adequate volume   Culture PENDING  Incomplete   Report Status PENDING  Incomplete  Blood culture (routine x 2)     Status: None (Preliminary result)   Collection Time: 09/01/16 11:08 AM  Result Value Ref Range Status   Specimen Description BLOOD RIGHT WRIST  Final   Special Requests   Final    BOTTLES DRAWN AEROBIC AND ANAEROBIC Blood Culture adequate volume   Culture PENDING  Incomplete   Report Status PENDING  Incomplete    Medical History: Past Medical History:  Diagnosis Date  . BPH (benign prostatic hyperplasia)   . CAD (coronary artery disease)    Multivessel status post CABG 2011 - LIMA to LAD, SVG to diagonal, SVG to OM, SVG to PDA  . Cellulitis    12/15  . Chronic back pain   . Chronic diastolic CHF (congestive heart failure) ([place])   . CKD (chronic kidney disease), stage III   . COPD (chronic obstructive pulmonary disease) ([place])   . Essential hypertension   . Gastric mass    EGD 9/15  . GERD (gastroesophageal reflux disease)   . History of DVT (deep vein thrombosis)    Postphlebitic syndrome  . History of kidney stones   . HOH (hard of hearing)   . Hx of CABG   . Hyperlipidemia   . Persistent atrial fibrillation ([place])    a. s/p DCCV 03/2016.  [name] Sleep apnea    Stop Bang score of 5  . Type 2 diabetes mellitus (  [place])     Medications:  Scheduled:  . cloNIDine  0.1 mg Oral BID  . docusate sodium  100 mg Oral BID  . enoxaparin (LOVENOX) injection  95 mg Subcutaneous Q12H  . insulin aspart  0-20 Units Subcutaneous TID WC  . insulin aspart  0-5 Units Subcutaneous QHS  . insulin glargine  50 Units Subcutaneous QHS  . metoprolol succinate  75 mg Oral Daily  . pantoprazole  40 mg Oral Daily  . rosuvastatin  10 mg Oral Daily  . sucralfate  1 g Oral TID WC & HS  . tamsulosin  0.4 mg Oral QPC breakfast  . timolol  1 drop Left Eye Daily   Infusions:  . sodium chloride Stopped (09/01/16 1533)  . clindamycin (CLEOCIN) IV 900 mg (09/02/16 0023)  . lactated ringers 75 mL/hr at 09/01/16 1830  . piperacillin-tazobactam  (ZOSYN)  IV 3.375 g (09/02/16 0023)  . vancomycin 1,500 mg (09/02/16 0023)   PRN: acetaminophen **OR** acetaminophen, hydrALAZINE, ipratropium-albuterol, ondansetron **OR** ondansetron (ZOFRAN) IV, oxyCODONE Anti-infectives    Start     Dose/Rate Route Frequency Ordered Stop   09/01/16 2330  piperacillin-tazobactam (ZOSYN) IVPB 3.375 g     3.375 g 12.5 mL/hr over 240 Minutes Intravenous Every 8 hours 09/01/16 2317     09/01/16 2300  vancomycin (VANCOCIN) 1,500 mg in sodium chloride 0.9 % 500 mL IVPB     1,500 mg 250 mL/hr over 120 Minutes Intravenous Every 24 hours 09/01/16 1848     09/01/16 2300  clindamycin (CLEOCIN) IVPB 900 mg     900 mg 100 mL/hr over 30 Minutes Intravenous Every 8 hours 09/01/16 2259     09/01/16 1900  ceFEPIme (MAXIPIME) 2 g in dextrose 5 % 50 mL IVPB  Status:  Discontinued     2 g 100 mL/hr over 30 Minutes Intravenous Every 24 hours 09/01/16 1848 09/01/16 2259   09/01/16 1845  metroNIDAZOLE (FLAGYL) IVPB 500 mg  Status:  Discontinued     500 mg 100 mL/hr over 60 Minutes Intravenous Every 8 hours 09/01/16 1822 09/01/16 2258   09/01/16 1200  vancomycin (VANCOCIN) IVPB 1000 mg/200 mL premix     1,000 mg 200 mL/hr over 60 Minutes Intravenous  Once 09/01/16 1149 09/01/16 1317      Assessment: 83 yo male with diabetic foot infection that appears to be necrotizing. Antibiotics changed to vanc/zosyn/clindamycin.  Pharmacy consulted for zosyn dosing.     Plan:  Preliminary review of pertinent patient information completed.  Protocol will be initiated with dose(s) of zosyn 3.375 grams Q8.  Forestine Na clinical pharmacist will complete review during morning rounds to assess patient and finalize treatment regimen if needed.  Kamdyn Covel Scarlett, RPH 09/02/2016,2:10 AM

## 2016-09-02 NOTE — Progress Notes (Signed)
Inpatient Diabetes Program Recommendations  AACE/ADA: New Consensus Statement on Inpatient Glycemic Control (2015)  Target Ranges:  Prepandial:   less than 140 mg/dL      Peak postprandial:   less than 180 mg/dL (1-2 hours)      Critically ill patients:  140 - 180 mg/dL   Results for OLAF, MESA (MRN [id number]) as of 09/02/2016 12:13  Ref. Range 09/01/2016 10:36 09/01/2016 15:15 09/01/2016 21:29 09/02/2016 07:37 09/02/2016 11:14  Glucose-Capillary Latest Ref Range: 65 - 99 mg/dL 435 (H) 366 (H) 323 (H) 282 (H) 273 (H)   Review of Glycemic Control  Diabetes history: DM2 Outpatient Diabetes medications: Lantus 50 units QHS Current orders for Inpatient glycemic control: Lantus 50 units QHS, Novolog 0-20 units TID with meals, Novolog 0-5 units QHS  Inpatient Diabetes Program Recommendations: Insulin-Meal Coverage: Please consider ordering Novolog 4 units TID with meals for meal coverage if patient is eating at least 50% of meals. HgbA1C: A1C 9.8% on 08/20/16 indicating an average glucose of 235 mg/dl over the past 2-3 months.  PCP may want to consider referring patient to Endocrinologist to assist with improve DM control.  NOTE: Spoke with patient about diabetes and home regimen for diabetes control. Patient reports that he is followed by PCP for diabetes management and currently he takes Lantus 50 units QHS as an outpatient for diabetes control. Patient reports that he is taking insulin as prescribed and that does not have any issues with remembering to take his insulin. Patient states that he checks his glucose 2-3 times per week and that it has ranged from low 100's to 400's mg/dl over the past couple of weeks. Patient states glucose has been higher since he started having the issue with his foot being infected.   Discussed most current A1C results (9.8% on 08/20/16) and explained that his current A1C indicates an average glucose of 235 mg/dl over the past 2-3 months. Discussed glucose and A1C  goals. Discussed importance of checking CBGs and maintaining good CBG control to prevent long-term and short-term complications. Explained how hyperglycemia leads to damage within blood vessels which lead to the common complications seen with uncontrolled diabetes. Stressed to the patient the importance of improving glycemic control to prevent further complications from uncontrolled diabetes especially to improve wound healing. Discussed impact of nutrition, exercise, stress, sickness, and medications on diabetes control. Encouraged patient to check his glucose at least 2 times per day and to keep a log book of glucose readings and insulin taken which he will need to take to doctor appointments. Explained how the doctor he follows up with can use the log book to continue to make insulin adjustments if needed. Encouraged patient to contact his PCP if his glucose was consistently over 180 mg/dl to inquire about insulin adjustments.Patient reports that he is going to have to have surgery on his foot and he is concerned about the surgery. Patient states that he does not have any family close by that is able to help him at home. Provided emotional support to patient.  Patient verbalized understanding of information discussed and he states that he has no further questions at this time related to diabetes.  Thanks, Barnie Alderman, RN, MSN, CDE Diabetes Coordinator Inpatient Diabetes Program [phone] (Team Pager)

## 2016-09-02 NOTE — Care Management (Signed)
Per Dr. Cindie Laroche, DC Bedrest order. Patient may participate with PT evaluation. Bedrest order discontinued.

## 2016-09-03 LAB — GLUCOSE, CAPILLARY
GLUCOSE-CAPILLARY: 51 mg/dL — AB (ref 65–99)
GLUCOSE-CAPILLARY: 51 mg/dL — AB (ref 65–99)
GLUCOSE-CAPILLARY: 123 mg/dL — AB (ref 65–99)
GLUCOSE-CAPILLARY: 79 mg/dL (ref 65–99)
Glucose-Capillary: 67 mg/dL (ref 65–99)
Glucose-Capillary: 88 mg/dL (ref 65–99)
Glucose-Capillary: 111 mg/dL — ABNORMAL HIGH (ref 65–99)

## 2016-09-03 LAB — BASIC METABOLIC PANEL
ANION GAP: 10 (ref 5–15)
BUN: 26 mg/dL — ABNORMAL HIGH (ref 6–20)
CHLORIDE: 101 mmol/L (ref 101–111)
CO2: 28 mmol/L (ref 22–32)
CREATININE: 1.47 mg/dL — AB (ref 0.61–1.24)
Calcium: 10 mg/dL (ref 8.9–10.3)
GFR calc non Af Amer: 42 mL/min — ABNORMAL LOW (ref >60)
GFR, EST AFRICAN AMERICAN: 49 mL/min — AB (ref >60)
Glucose, Bld: 55 mg/dL — ABNORMAL LOW (ref 65–99)
Potassium: 3.8 mmol/L (ref 3.5–5.1)
SODIUM: 139 mmol/L (ref 135–145)

## 2016-09-03 LAB — SURGICAL PCR SCREEN
MRSA, PCR: NEGATIVE
Staphylococcus aureus: NEGATIVE

## 2016-09-03 LAB — VANCOMYCIN, TROUGH: Vancomycin Tr: 23 ug/mL (ref 15–20)

## 2016-09-03 MED ORDER — INSULIN GLARGINE 100 UNIT/ML ~~LOC~~ SOLN
22.0000 [IU] | Freq: Every day | SUBCUTANEOUS | Status: DC
  Administered 2016-09-04 – 2016-09-08 (×5): 22 [IU] via SUBCUTANEOUS
  Filled 2016-09-03 (×7): qty 0.22

## 2016-09-03 MED ORDER — INSULIN GLARGINE 100 UNIT/ML ~~LOC~~ SOLN
35.0000 [IU] | Freq: Every day | SUBCUTANEOUS | Status: DC
  Filled 2016-09-03: qty 0.35

## 2016-09-03 MED ORDER — FLUOXETINE HCL 20 MG/5ML PO SOLN
20.0000 mg | Freq: Every day | ORAL | Status: DC
  Administered 2016-09-03 – 2016-09-08 (×3): 20 mg via ORAL
  Filled 2016-09-03 (×9): qty 5

## 2016-09-03 MED ORDER — GLUCOSE 40 % PO GEL
ORAL | Status: AC
  Administered 2016-09-03: 37.5 g
  Filled 2016-09-03: qty 1

## 2016-09-03 NOTE — Progress Notes (Signed)
Subjective: Patient complains of left foot pain.  Objective: Vital signs in last 24 hours: Temp:  [97.7 F (36.5 C)-98.3 F (36.8 C)] 98.3 F (36.8 C) (08/30 0516) Pulse Rate:  [81-93] 93 (08/30 0516) Resp:  [14-20] 14 (08/30 0516) BP: (114-131)/(57-62) 127/62 (08/30 0516) SpO2:  [93 %-99 %] 99 % (08/30 0516) Last BM Date: 09/01/16  Intake/Output from previous day: 08/29 0701 - 08/30 0700 In: 2570.8 [P.O.:240; I.V.:1480.8; IV Piggyback:850] Out: 250 [Urine:250] Intake/Output this shift: No intake/output data recorded.  General appearance: alert, cooperative and no distress Extremities: Left foot still swollen with ischemic changes in a linear fashion along the sole of the foot. Erythema is present. No open ulcerations noted.  Lab Results:   Recent Labs  09/01/16 1100 09/02/16 0435  WBC 28.7* 24.4*  HGB 11.8* 10.9*  HCT 37.1* 34.2*  PLT 303 299   BMET  Recent Labs  09/02/16 0435 09/03/16 0609  NA 135 139  K 4.3 3.8  CL 99* 101  CO2 25 28  GLUCOSE 296* 55*  BUN 31* 26*  CREATININE 1.34* 1.47*  CALCIUM 10.0 10.0   PT/INR No results for input(s): LABPROT, INR in the last 72 hours.  Studies/Results: Dg Chest 2 View  Result Date: 09/01/2016 CLINICAL DATA:  Generalized abdominal and chest pain. EXAM: CHEST  2 VIEW COMPARISON:  08/27/2016. FINDINGS: Stable cardiomegaly with aortic atherosclerosis. Status post CABG. Mild interstitial edema with atelectasis at the lung bases. No pneumonic consolidation, effusion or pneumothorax. No acute osseous abnormality. IMPRESSION: 1. Stable cardiomegaly with aortic atherosclerosis and post CABG change. 2. Bibasilar atelectasis with minimal interstitial edema. Electronically Signed   By: Ashley Royalty M.D.   On: 09/01/2016 14:28   US Arterial Seg Single  Result Date: 09/02/2016 CLINICAL DATA:  83 year old male with left foot puncture wound and cellulitis. EXAM: NONINVASIVE PHYSIOLOGIC VASCULAR STUDY OF BILATERAL LOWER  EXTREMITIES TECHNIQUE: Evaluation of both lower extremities was performed at rest, including calculation of ankle-brachial indices, multiple segmental pressure evaluation, segmental Doppler and segmental pulse volume recording. COMPARISON:  None. FINDINGS: Right ABI:  1.0 Left ABI:  1.2 Right Lower Extremity: Normal posterior tibial arterial waveform. The dorsalis pedis waveform is slightly abnormal suggesting an element of anterior tibial or small vessel disease. Left Lower Extremity: Normal posterior tibial waveform. The dorsalis pedis waveform is slightly abnormal suggesting anterior tibial or small vessel disease. IMPRESSION: Normal bilateral resting ankle-brachial indices. Signed, Criselda Peaches, MD [facility]iology Electronically Signed   By: Jacqulynn Cadet M.D.   On: 09/02/2016 11:10   Dg Foot Complete Left  Result Date: 09/01/2016 CLINICAL DATA:  Left foot redness. EXAM: LEFT FOOT - COMPLETE 3+ VIEW COMPARISON:  08/20/2016 FINDINGS: There is no evidence of fracture or dislocation. There is no periosteal reaction or bone destruction. There soft tissue emphysema between the third and fourth metatarsals concerning for infection. IMPRESSION: 1. No evidence of osteomyelitis of the left foot. 2. Soft tissue emphysema between the third and fourth metatarsals concerning for necrotizing infection. Electronically Signed   By: Kathreen Devoid   On: 09/01/2016 14:29    Anti-infectives: Anti-infectives    Start     Dose/Rate Route Frequency Ordered Stop   09/01/16 2330  piperacillin-tazobactam (ZOSYN) IVPB 3.375 g     3.375 g 12.5 mL/hr over 240 Minutes Intravenous Every 8 hours 09/01/16 2317     09/01/16 2300  vancomycin (VANCOCIN) 1,500 mg in sodium chloride 0.9 % 500 mL IVPB  1,500 mg 250 mL/hr over 120 Minutes Intravenous Every 24 hours 09/01/16 1848     09/01/16 2300  clindamycin (CLEOCIN) IVPB 900 mg     900 mg 100 mL/hr over 30 Minutes  Intravenous Every 8 hours 09/01/16 2259     09/01/16 1900  ceFEPIme (MAXIPIME) 2 g in dextrose 5 % 50 mL IVPB  Status:  Discontinued     2 g 100 mL/hr over 30 Minutes Intravenous Every 24 hours 09/01/16 1848 09/01/16 2259   09/01/16 1845  metroNIDAZOLE (FLAGYL) IVPB 500 mg  Status:  Discontinued     500 mg 100 mL/hr over 60 Minutes Intravenous Every 8 hours 09/01/16 1822 09/01/16 2258   09/01/16 1200  vancomycin (VANCOCIN) IVPB 1000 mg/200 mL premix     1,000 mg 200 mL/hr over 60 Minutes Intravenous  Once 09/01/16 1149 09/01/16 1317      Assessment/Plan: Impression: Cellulitis with gas present, left foot, diabetes mellitus. No significant improvement. Plan: Will take patient to the operating room tomorrow for incision and drainage of abscess in the left foot. The risks and benefits of the procedure were fully explained to the patient, who gave informed consent.   LOS: 2 days    Aviva Signs 09/03/2016

## 2016-09-03 NOTE — Progress Notes (Signed)
Patient had a blood sugar of 51 this am, treated with po carbs and remained at 51. Patient given guclose gel with an increase in blood sugar to 88. MD informed and orders given

## 2016-09-03 NOTE — Progress Notes (Signed)
Inpatient Diabetes Program Recommendations  AACE/ADA: New Consensus Statement on Inpatient Glycemic Control (2015)  Target Ranges:  Prepandial:   less than 140 mg/dL      Peak postprandial:   less than 180 mg/dL (1-2 hours)      Critically ill patients:  140 - 180 mg/dL  Results for [name], [name] (MRN [id number]) as of 09/03/2016 07:43  Ref. Range 09/02/2016 07:37 09/02/2016 11:14 09/02/2016 16:21 09/02/2016 21:53 09/03/2016 07:38  Glucose-Capillary Latest Ref Range: 65 - 99 mg/dL 282 (H) 273 (H) 127 (H) 95 51 (L)   Results for [name], [name] (MRN [id number]) as of 09/03/2016 07:43  Ref. Range 08/20/2016 14:00  Hemoglobin A1C Latest Ref Range: 4.8 - 5.6 % 9.8 (H)   Diabetes history: DM2 Outpatient Diabetes medications: Lantus 50 units QHS Current orders for Inpatient glycemic control: Lantus 50 units QHS, Novolog 0-20 units TID with meals, Novolog 0-5 units QHS  Inpatient Diabetes Program Recommendations: Insulin-Basal: Please consider decreasing Lantus to 40 units QHS. Insulin-Correction: Please consider decreasing Novolog to moderate correction scale. HgbA1C: A1C 9.8% on 08/20/16 indicating an average glucose of 235 mg/dl over the past 2-3 months.  PCP may want to consider referring patient to Endocrinologist to assist with improve DM control.  Thanks, Barnie Alderman, RN, MSN, CDE Diabetes Coordinator Inpatient Diabetes Program [phone] (Team Pager from 8am to 5pm)

## 2016-09-03 NOTE — Clinical Social Work Note (Signed)
Patient referred to CSW for access to medications. This is CM function. CM aware and will address.      Kaelei Wheeler, Clydene Pugh, LCSW

## 2016-09-03 NOTE — Progress Notes (Signed)
Physical Therapy Treatment Patient Details Name: [name] MRN: [id number] DOB: Jun 13, 1931 Today's Date: 09/03/2016    History of Present Illness [name] is a 83 y.o. male with medical history significant of dCHF, COPD, CKD 3, HTN, CAD, DM2, HLD, BPH who was hospitalized from 8/16 - 23 for a diabetic foot infection; he was discharged on Clindamycin.  Since discharge, he has had esophageal pain, n/v, weakness, and has been unable to eat.  He did not feel good when he left, unable to stand up.  He refused SNF rehab with last discharge; he now recognizes that this might be necessary.  +fever, subjective; + chills.  This started after he stepped on a bolt - he reports that it was NOT better at the time of last discharge.  He has continued to go "downhill:" since he was admitted last time.  He reports that he did take Clinda following last hospitalization.      PT Comments    Patient limited for functional activity today secondary to c/o severe left foot pain and declined to attempt sitting up at bedside or standing, required active assistance to complete exercises with LLE  Due to pain and weakness, Mod assist for rolling left and right and required Max 2 person assist to reposition in bed.  Patient will benefit from continued physical therapy in hospital and recommended venue below to increase strength, balance, endurance for safe ADLs and gait.    Follow Up Recommendations  SNF     Equipment Recommendations  Wheelchair (measurements PT)    Recommendations for Other Services       Precautions / Restrictions Precautions Precautions: Fall Precaution Comments: chronic left foot wound/pain Restrictions Weight Bearing Restrictions: No    Mobility  Bed Mobility Overal bed mobility: Needs Assistance Bed Mobility: Rolling Rolling: Mod assist         General bed mobility comments: Patient declined to sit up at bedside secondary to c/o severe left foot pain  Transfers                    Ambulation/Gait                 Stairs            Wheelchair Mobility    Modified Rankin (Stroke Patients Only)       Balance                                            Cognition Arousal/Alertness: Awake/alert Behavior During Therapy: WFL for tasks assessed/performed Overall Cognitive Status: Within Functional Limits for tasks assessed                                        Exercises General Exercises - Lower Extremity Ankle Circles/Pumps: AROM;Right;10 reps;AAROM;Left Quad Sets: AROM;Both;10 reps Gluteal Sets: AROM;Both;10 reps Short Arc QuadSinclair Duncan;Both;10 reps Heel Slides: AAROM;Both;10 reps    General Comments        Pertinent Vitals/Pain Pain Assessment: Faces Faces Pain Scale: Hurts whole lot Pain Location: left foot mostly distal plantar surface Pain Descriptors / Indicators: Burning;Aching;Grimacing;Moaning Pain Intervention(s): Limited activity within patient's tolerance;Monitored during session    Home Living  Prior Function            PT Goals (current goals can now be found in the care plan section) Acute Rehab PT Goals Patient Stated Goal: return home after rehab PT Goal Formulation: With patient Time For Goal Achievement: 09/09/16 Potential to Achieve Goals: Fair Progress towards PT goals: Progressing toward goals    Frequency    Min 3X/week      PT Plan Current plan remains appropriate    Co-evaluation              AM-PAC PT "6 Clicks" Daily Activity  Outcome Measure  Difficulty turning over in bed (including adjusting bedclothes, sheets and blankets)?: Unable Difficulty moving from lying on back to sitting on the side of the bed? : Unable Difficulty sitting down on and standing up from a chair with arms (e.g., wheelchair, bedside commode, etc,.)?: Unable Help needed moving to and from a bed to chair (including a wheelchair)?:  A Lot Help needed walking in hospital room?: Total Help needed climbing 3-5 steps with a railing? : Total 6 Click Score: 7    End of Session Equipment Utilized During Treatment: Oxygen Activity Tolerance: Patient limited by pain Patient left: in bed;with call bell/phone within reach;with nursing/sitter in room Nurse Communication: Mobility status PT Visit Diagnosis: Unsteadiness on feet (R26.81);Other abnormalities of gait and mobility (R26.89);Muscle weakness (generalized) (M62.81) Pain - Right/Left: Left Pain - part of body: Ankle and joints of foot     Time: 1112-1140 PT Time Calculation (min) (ACUTE ONLY): 28 min  Charges:  $Therapeutic Exercise: 23-37 mins                    G Codes:       2:14 PM, 09/11/2016 [name], MPT Physical Therapist with [facility] 336 [phone] office [phone] mobile phone

## 2016-09-03 NOTE — Progress Notes (Signed)
Patient currently on multiple antibiotics vancomycin and Maxipime clindamycin Flagyl and Zosyn. Schedule for surgery IND in a.m. due to gas presents patient had a blood sugar 51 Lantus reduced from 50-35 we'll reduce Lantus to 20 units this evening as he will be nothing by mouth in a.m. YUL DIANA JSE:[id number] DOB: [birth year] DOA: 09/01/2016 PCP: [name], MD   Physical Exam: Blood pressure 127/62, pulse 93, temperature 98.3 F (36.8 C), temperature source Oral, resp. rate 14, height 5' 10.5" (1.791 m), weight 93.6 kg (206 lb 4.8 oz), SpO2 99 %. Lungs clear to A&P no rales wheeze rhonchi heart regular rhythm no murmurs gallops he feels rubs abdomen soft nontender bowel sounds normoactive   Investigations:  Recent Results (from the past 240 hour(s))  Blood culture (routine x 2)     Status: None (Preliminary result)   Collection Time: 09/01/16 11:00 AM  Result Value Ref Range Status   Specimen Description RIGHT ANTECUBITAL  Final   Special Requests   Final    BOTTLES DRAWN AEROBIC AND ANAEROBIC Blood Culture adequate volume   Culture NO GROWTH 2 DAYS  Final   Report Status PENDING  Incomplete  Blood culture (routine x 2)     Status: None (Preliminary result)   Collection Time: 09/01/16 11:08 AM  Result Value Ref Range Status   Specimen Description BLOOD RIGHT WRIST  Final   Special Requests   Final    BOTTLES DRAWN AEROBIC AND ANAEROBIC Blood Culture adequate volume   Culture NO GROWTH 2 DAYS  Final   Report Status PENDING  Incomplete  Surgical pcr screen     Status: None   Collection Time: 09/03/16 11:33 AM  Result Value Ref Range Status   MRSA, PCR NEGATIVE NEGATIVE Final   Staphylococcus aureus NEGATIVE NEGATIVE Final    Comment: (NOTE) The Xpert SA Assay (FDA approved for NASAL specimens in patients 64 years of age and older), is one component of a comprehensive surveillance program. It is not intended to diagnose infection nor to guide or monitor treatment.       Basic Metabolic Panel:  Recent Labs  09/02/16 0435 09/03/16 0609  NA 135 139  K 4.3 3.8  CL 99* 101  CO2 25 28  GLUCOSE 296* 55*  BUN 31* 26*  CREATININE 1.34* 1.47*  CALCIUM 10.0 10.0   Liver Function Tests:  Recent Labs  09/01/16 1100  AST 19  ALT 35  ALKPHOS 201*  BILITOT 1.1  PROT 7.4  ALBUMIN 3.1*     CBC:  Recent Labs  09/01/16 1100 09/02/16 0435  WBC 28.7* 24.4*  HGB 11.8* 10.9*  HCT 37.1* 34.2*  MCV 86.3 86.6  PLT 303 299    Dg Chest 2 View  Result Date: 09/01/2016 CLINICAL DATA:  Generalized abdominal and chest pain. EXAM: CHEST  2 VIEW COMPARISON:  08/27/2016. FINDINGS: Stable cardiomegaly with aortic atherosclerosis. Status post CABG. Mild interstitial edema with atelectasis at the lung bases. No pneumonic consolidation, effusion or pneumothorax. No acute osseous abnormality. IMPRESSION: 1. Stable cardiomegaly with aortic atherosclerosis and post CABG change. 2. Bibasilar atelectasis with minimal interstitial edema. Electronically Signed   By: Ashley Royalty M.D.   On: 09/01/2016 14:28   US Arterial Seg Single  Result Date: 09/02/2016 CLINICAL DATA:  83 year old male with left foot puncture wound and cellulitis. EXAM: NONINVASIVE PHYSIOLOGIC VASCULAR STUDY OF BILATERAL LOWER EXTREMITIES TECHNIQUE: Evaluation of both lower extremities was performed at rest, including calculation of ankle-brachial indices, multiple segmental pressure evaluation, segmental  Doppler and segmental pulse volume recording. COMPARISON:  None. FINDINGS: Right ABI:  1.0 Left ABI:  1.2 Right Lower Extremity: Normal posterior tibial arterial waveform. The dorsalis pedis waveform is slightly abnormal suggesting an element of anterior tibial or small vessel disease. Left Lower Extremity: Normal posterior tibial waveform. The dorsalis pedis waveform is slightly abnormal suggesting anterior tibial or small vessel disease. IMPRESSION: Normal bilateral resting ankle-brachial indices.  Signed, Criselda Peaches, MD [facility] Electronically Signed   By: Jacqulynn Cadet M.D.   On: 09/02/2016 11:10   Dg Foot Complete Left  Result Date: 09/01/2016 CLINICAL DATA:  Left foot redness. EXAM: LEFT FOOT - COMPLETE 3+ VIEW COMPARISON:  08/20/2016 FINDINGS: There is no evidence of fracture or dislocation. There is no periosteal reaction or bone destruction. There soft tissue emphysema between the third and fourth metatarsals concerning for infection. IMPRESSION: 1. No evidence of osteomyelitis of the left foot. 2. Soft tissue emphysema between the third and fourth metatarsals concerning for necrotizing infection. Electronically Signed   By: Kathreen Devoid   On: 09/01/2016 14:29      Medications:  Impression:  Principal Problem:   Diabetic foot infection ([place]) Active Problems:   Essential hypertension   Nausea and vomiting   Diabetes ([place])   CKD (chronic kidney disease), stage III   Esophageal stricture   Atrial fibrillation with normal ventricular rate (HCC)     Plan: Decrease Lantus to 22 units this p.m. at bedtime surgery scheduled for a.m.  Consultants: General surgery   Procedures for IND of foot cellulitis in a.m.   Antibiotics: Multiple           Time spent: 30 minutes   LOS: 2 days   Shari Natt M   09/03/2016, 1:50 PM

## 2016-09-04 ENCOUNTER — Encounter (HOSPITAL_COMMUNITY)
Admission: EM | Disposition: A | Discharge status: Skilled Nursing Facility | Payer: Self-pay | Source: Home / Self Care | Attending: Family Medicine

## 2016-09-04 ENCOUNTER — Inpatient Hospital Stay (HOSPITAL_COMMUNITY): Payer: Medicare Other | Admitting: Anesthesiology

## 2016-09-04 ENCOUNTER — Encounter (HOSPITAL_COMMUNITY): Payer: Self-pay | Admitting: Anesthesiology

## 2016-09-04 HISTORY — PX: INCISION AND DRAINAGE ABSCESS: SHX5864

## 2016-09-04 LAB — BASIC METABOLIC PANEL
Anion gap: 7 (ref 5–15)
BUN: 24 mg/dL — AB (ref 6–20)
CHLORIDE: 101 mmol/L (ref 101–111)
CO2: 29 mmol/L (ref 22–32)
CREATININE: 1.52 mg/dL — AB (ref 0.61–1.24)
Calcium: 10 mg/dL (ref 8.9–10.3)
GFR calc Af Amer: 47 mL/min — ABNORMAL LOW (ref >60)
GFR calc non Af Amer: 41 mL/min — ABNORMAL LOW (ref >60)
Glucose, Bld: 130 mg/dL — ABNORMAL HIGH (ref 65–99)
Potassium: 4.2 mmol/L (ref 3.5–5.1)
Sodium: 137 mmol/L (ref 135–145)

## 2016-09-04 LAB — GLUCOSE, CAPILLARY
GLUCOSE-CAPILLARY: 131 mg/dL — AB (ref 65–99)
GLUCOSE-CAPILLARY: 272 mg/dL — AB (ref 65–99)
GLUCOSE-CAPILLARY: 252 mg/dL — AB (ref 65–99)
Glucose-Capillary: 147 mg/dL — ABNORMAL HIGH (ref 65–99)
Glucose-Capillary: 147 mg/dL — ABNORMAL HIGH (ref 65–99)

## 2016-09-04 SURGERY — INCISION AND DRAINAGE, ABSCESS
Anesthesia: Regional | Site: Foot | Laterality: Left

## 2016-09-04 MED ORDER — SODIUM CHLORIDE 0.9 % IV SOLN: INTRAVENOUS | Status: DC

## 2016-09-04 MED ORDER — PROPOFOL 500 MG/50ML IV EMUL
INTRAVENOUS | Status: DC | PRN
  Administered 2016-09-04: 50 ug/kg/min via INTRAVENOUS

## 2016-09-04 MED ORDER — BUPIVACAINE HCL (PF) 0.5 % IJ SOLN
INTRAMUSCULAR | Status: AC
  Filled 2016-09-04: qty 30

## 2016-09-04 MED ORDER — KETOROLAC TROMETHAMINE 30 MG/ML IJ SOLN
30.0000 mg | Freq: Once | INTRAMUSCULAR | Status: AC
  Administered 2016-09-04: 30 mg via INTRAVENOUS
  Filled 2016-09-04: qty 1

## 2016-09-04 MED ORDER — HYDROCODONE-ACETAMINOPHEN 5-325 MG PO TABS
1.0000 | ORAL_TABLET | Freq: Four times a day (QID) | ORAL | Status: DC | PRN
  Administered 2016-09-08: 1 via ORAL
  Filled 2016-09-04: qty 1

## 2016-09-04 MED ORDER — BUPIVACAINE HCL (PF) 0.25 % IJ SOLN
INTRAMUSCULAR | Status: AC
  Filled 2016-09-04: qty 30

## 2016-09-04 MED ORDER — LIDOCAINE HCL (PF) 2 % IJ SOLN
INTRAMUSCULAR | Status: AC
  Filled 2016-09-04: qty 20

## 2016-09-04 MED ORDER — IPRATROPIUM-ALBUTEROL 0.5-2.5 (3) MG/3ML IN SOLN
RESPIRATORY_TRACT | Status: AC
  Filled 2016-09-04: qty 3

## 2016-09-04 MED ORDER — CHLORHEXIDINE GLUCONATE CLOTH 2 % EX PADS
6.0000 | MEDICATED_PAD | Freq: Once | CUTANEOUS | Status: AC
  Administered 2016-09-04: 6 via TOPICAL

## 2016-09-04 MED ORDER — HYDROCORTISONE 1 % EX CREA: TOPICAL_CREAM | Freq: Three times a day (TID) | CUTANEOUS | Status: DC | PRN

## 2016-09-04 MED ORDER — SODIUM CHLORIDE 0.9 % IR SOLN
Status: DC | PRN
  Administered 2016-09-04: 3000 mL
  Administered 2016-09-04: 1000 mL

## 2016-09-04 MED ORDER — MORPHINE SULFATE (PF) 2 MG/ML IV SOLN
2.0000 mg | INTRAVENOUS | Status: DC | PRN
  Administered 2016-09-07: 2 mg via INTRAVENOUS
  Filled 2016-09-04 (×2): qty 1

## 2016-09-04 MED ORDER — PROPOFOL 10 MG/ML IV BOLUS
INTRAVENOUS | Status: AC
  Filled 2016-09-04: qty 20

## 2016-09-04 MED ORDER — PROPOFOL 10 MG/ML IV BOLUS
INTRAVENOUS | Status: DC | PRN
  Administered 2016-09-04 (×3): 10 mg via INTRAVENOUS

## 2016-09-04 MED ORDER — IPRATROPIUM-ALBUTEROL 0.5-2.5 (3) MG/3ML IN SOLN: 3.0000 mL | Freq: Four times a day (QID) | RESPIRATORY_TRACT | Status: DC

## 2016-09-04 MED ORDER — VANCOMYCIN HCL IN DEXTROSE 1-5 GM/200ML-% IV SOLN
1000.0000 mg | INTRAVENOUS | Status: DC
  Administered 2016-09-04 – 2016-09-08 (×5): 1000 mg via INTRAVENOUS
  Filled 2016-09-04 (×5): qty 200

## 2016-09-04 MED ORDER — BUPIVACAINE HCL (PF) 0.5 % IJ SOLN
INTRAMUSCULAR | Status: DC | PRN
  Administered 2016-09-04: 8 mL

## 2016-09-04 SURGICAL SUPPLY — 31 items
BAG HAMPER (MISCELLANEOUS) ×3 IMPLANT
BANDAGE ELASTIC 4 VELCRO ST LF (GAUZE/BANDAGES/DRESSINGS) ×3 IMPLANT
BNDG CONFORM 2 STRL LF (GAUZE/BANDAGES/DRESSINGS) ×3 IMPLANT
BNDG GAUZE ELAST 4 BULKY (GAUZE/BANDAGES/DRESSINGS) ×3 IMPLANT
CLOTH BEACON ORANGE TIMEOUT ST (SAFETY) ×3 IMPLANT
COVER LIGHT HANDLE STERIS (MISCELLANEOUS) ×6 IMPLANT
ELECT REM PT RETURN 9FT ADLT (ELECTROSURGICAL) ×3
ELECTRODE REM PT RTRN 9FT ADLT (ELECTROSURGICAL) ×1 IMPLANT
GAUZE IODOFORM PACK 1/2 7832 (GAUZE/BANDAGES/DRESSINGS) ×3 IMPLANT
GAUZE SPONGE 4X4 12PLY STRL (GAUZE/BANDAGES/DRESSINGS) ×3 IMPLANT
GLOVE BIOGEL PI IND STRL 6.5 (GLOVE) ×1 IMPLANT
GLOVE BIOGEL PI IND STRL 7.0 (GLOVE) ×1 IMPLANT
GLOVE BIOGEL PI INDICATOR 6.5 (GLOVE) ×2
GLOVE BIOGEL PI INDICATOR 7.0 (GLOVE) ×2
GLOVE SURG SS PI 7.5 STRL IVOR (GLOVE) ×3 IMPLANT
GOWN STRL REUS W/TWL LRG LVL3 (GOWN DISPOSABLE) ×6 IMPLANT
HANDPIECE INTERPULSE COAX TIP (DISPOSABLE) ×2
IV NS IRRIG 3000ML ARTHROMATIC (IV SOLUTION) ×3 IMPLANT
KIT ROOM TURNOVER APOR (KITS) ×3 IMPLANT
MANIFOLD NEPTUNE II (INSTRUMENTS) ×3 IMPLANT
NS IRRIG 1000ML POUR BTL (IV SOLUTION) ×3 IMPLANT
PACK BASIC LIMB (CUSTOM PROCEDURE TRAY) IMPLANT
PACK MINOR (CUSTOM PROCEDURE TRAY) ×3 IMPLANT
PAD ABD 5X9 TENDERSORB (GAUZE/BANDAGES/DRESSINGS) ×3 IMPLANT
PAD ARMBOARD 7.5X6 YLW CONV (MISCELLANEOUS) ×3 IMPLANT
SET BASIN LINEN APH (SET/KITS/TRAYS/PACK) ×3 IMPLANT
SET HNDPC FAN SPRY TIP SCT (DISPOSABLE) ×1 IMPLANT
SWAB CULTURE ESWAB REG 1ML (MISCELLANEOUS) IMPLANT
SWAB CULTURE LIQ STUART DBL (MISCELLANEOUS) ×3 IMPLANT
SYR BULB IRRIGATION 50ML (SYRINGE) ×3 IMPLANT
TUBE ANAEROBIC PORT A CUL  W/M (MISCELLANEOUS) ×3 IMPLANT

## 2016-09-04 NOTE — Clinical Social Work Placement (Signed)
   CLINICAL SOCIAL WORK PLACEMENT  NOTE  Date:  09/04/2016  Patient Details  Name: [name] MRN: [id number] Date of Birth: 07/28/1931  Clinical Social Work is seeking post-discharge placement for this patient at the [place] level of care (*CSW will initial, date and re-position this form in  chart as items are completed):  Yes   Patient/family provided with [place] Work Department's list of facilities offering this level of care within the geographic area requested by the patient (or if unable, by the patient's family).  Yes   Patient/family informed of their freedom to choose among providers that offer the needed level of care, that participate in Medicare, Medicaid or managed care program needed by the patient, have an available bed and are willing to accept the patient.  Yes   Patient/family informed of [facility]'s ownership interest in [facility] and [facility], as well as of the fact that they are under no obligation to receive care at these facilities.  PASRR submitted to EDS on 09/04/16     PASRR number received on 09/04/16     Existing PASRR number confirmed on       FL2 transmitted to all facilities in geographic area requested by pt/family on 09/04/16     FL2 transmitted to all facilities within larger geographic area on       Patient informed that his/her managed care company has contracts with or will negotiate with certain facilities, including the following:        Yes   Patient/family informed of bed offers received.  Patient chooses bed at [facility]     Physician recommends and patient chooses bed at      Patient to be transferred to [facility] on  .  Patient to be transferred to facility by       Patient family notified on   of transfer.  Name of family member notified:        PHYSICIAN       Additional Comment:    _______________________________________________ Ihor Gully, LCSW 09/04/2016, 2:48 PM

## 2016-09-04 NOTE — Anesthesia Preprocedure Evaluation (Addendum)
Anesthesia Evaluation  Patient identified by MRN, date of birth, ID band Patient awake    Airway Mallampati: I  TM Distance: >3 FB Neck ROM: Full    Dental  (+) Edentulous Upper, Edentulous Lower   Pulmonary shortness of breath, sleep apnea , pneumonia, resolved, COPD,  COPD inhaler, former smoker,   Overall decreased breath sounds, barrel chest     rales    Cardiovascular Exercise Tolerance: Poor hypertension, Pt. on medications and Pt. on home beta blockers +CHF (recent nl EF)   Rhythm:Irregular Rate:Normal  The cavity size was normal. Wall thickness was   increased increased in a pattern of mild to moderate LVH.   Systolic function was normal. The estimated ejection fraction was   in the range of 60% to 65%. Wall motion was normal; there were no   regional wall motion abnormalities. The study is not technically   sufficient to allow evaluation of LV diastolic function. - Aortic valve: Mildly calcified annulus. Trileaflet; mildly   thickened, mildly calcified leaflets. - Mitral valve: Calcified annulus. There was mild regurgitation. - Left atrium: The atrium was mildly dilated. - Right atrium: Central venous pressure (est): 8 mm Hg. - Tricuspid valve: There was trivial regurgitation. - Pulmonary arteries: PA peak pressure: 42 mm Hg (S).   Neuro/Psych    GI/Hepatic   Endo/Other  diabetes, Poorly Controlled, Type 1, Insulin Dependent  Renal/GU BMET        Component                Value               Date/Time                 NA                       137                 09/04/2016 0606           K                        4.2                 09/04/2016 0606           CL                       101                 09/04/2016 0606           CO2                      29                  09/04/2016 0606           GLUCOSE                  130 (H)             09/04/2016 0606           BUN                      24 (H)               09/04/2016 0606           CREATININE  1.52 (H)            09/04/2016 0606           CREATININE               0.93                06/02/2012 1130           CALCIUM                  10.0                09/04/2016 0606           GFRNONAA                 41 (L)              09/04/2016 0606           GFRAA                    47 (L)              09/04/2016 0606          Musculoskeletal   Abdominal (+) + obese,   Peds  Hematology  (+) anemia , CBC        Component                Value               Date/Time                 WBC                      24.4 (H)            09/02/2016 0435           RBC                      3.95 (L)            09/02/2016 0435           HGB                      10.9 (L)            09/02/2016 0435           HCT                      34.2 (L)            09/02/2016 0435           PLT                      299                 09/02/2016 0435           MCV                      86.6                09/02/2016 0435           MCH                      27.6  09/02/2016 0435           MCHC                     31.9                09/02/2016 0435           RDW                      13.7                09/02/2016 0435           LYMPHSABS                0.9                 08/20/2016 1100           MONOABS                  1.0                 08/20/2016 1100           EOSABS                   0.1                 08/20/2016 1100           BASOSABS                 0.0                 08/20/2016 1100        Anesthesia Other Findings   Reproductive/Obstetrics                             Anesthesia Physical Anesthesia Plan  ASA: IV and emergent  Anesthesia Plan: Regional   Post-op Pain Management:    Induction:   PONV Risk Score and Plan: Ondansetron  Airway Management Planned: Mask and Natural Airway  Additional Equipment:   Intra-op Plan:   Post-operative Plan:   Informed Consent: I have reviewed the patients  History and Physical, chart, labs and discussed the procedure including the risks, benefits and alternatives for the proposed anesthesia with the patient or authorized representative who has indicated his/her understanding and acceptance.     Plan Discussed with: CRNA  Anesthesia Plan Comments:        Anesthesia Quick Evaluation

## 2016-09-04 NOTE — Clinical Social Work Placement (Signed)
   CLINICAL SOCIAL WORK PLACEMENT  NOTE  Date:  09/04/2016  Patient Details  Name: JOHNROSS NABOZNY MRN: [id number] Date of Birth: 06/16/1931  Clinical Social Work is seeking post-discharge placement for this patient at the [place] level of care (*CSW will initial, date and re-position this form in  chart as items are completed):  Yes   Patient/family provided with [place] Work Department's list of facilities offering this level of care within the geographic area requested by the patient (or if unable, by the patient's family).  Yes   Patient/family informed of their freedom to choose among providers that offer the needed level of care, that participate in Medicare, Medicaid or managed care program needed by the patient, have an available bed and are willing to accept the patient.  Yes   Patient/family informed of [facility]'s ownership interest in [facility] and [facility], as well as of the fact that they are under no obligation to receive care at these facilities.  PASRR submitted to EDS on 09/04/16     PASRR number received on 09/04/16     Existing PASRR number confirmed on       FL2 transmitted to all facilities in geographic area requested by pt/family on 09/04/16     FL2 transmitted to all facilities within larger geographic area on       Patient informed that his/her managed care company has contracts with or will negotiate with certain facilities, including the following:            Patient/family informed of bed offers received.  Patient chooses bed at       Physician recommends and patient chooses bed at      Patient to be transferred to   on  .  Patient to be transferred to facility by       Patient family notified on   of transfer.  Name of family member notified:        PHYSICIAN       Additional Comment:    _______________________________________________ Ihor Gully, LCSW 09/04/2016, 2:06 PM

## 2016-09-04 NOTE — Progress Notes (Signed)
Pharmacy Antibiotic Note  [name] is a 83 y.o. male admitted on 09/01/2016 with diabetic foot ulcer.  Pharmacy consulted for vancomycin and zosyn dosing. His vanc trough last night was 23 mg/L.  Plan: Change vancomycin to 1 gm IV q24 hours  Continue zosyn 3.375 gm IV q8 hours F/u restart eliquis post I&D. F/u renal function, cultures and clinical course  Height: 5' 10.5" (179.1 cm) Weight: 206 lb 4.8 oz (93.6 kg) IBW/kg (Calculated) : 74.15  Temp (24hrs), Avg:98.4 F (36.9 C), Min:97.8 F (36.6 C), Max:98.7 F (37.1 C)   Recent Labs Lab 09/01/16 1100 09/01/16 1102 09/01/16 1408 09/02/16 0435 09/03/16 0609 09/03/16 2237  WBC 28.7*  --   --  24.4*  --   --   CREATININE 1.49*  --   --  1.34* 1.47*  --   LATICACIDVEN  --  1.4 1.1  --   --   --   VANCOTROUGH  --   --   --   --   --  23*    Estimated Creatinine Clearance: 44.2 mL/min (A) (by C-G formula based on SCr of 1.47 mg/dL (H)).    No Known Allergies   Thank you for allowing pharmacy to be a part of this patient's care.  [name] 09/04/2016 8:09 AM

## 2016-09-04 NOTE — Op Note (Signed)
Patient:  ADARIUS TIGGES  DOB:  Nov 10, 1931  MRN:  [id number]   Preop Diagnosis:  Cellulitis of left foot  Postop Diagnosis:  Same  Procedure:  Incision and drainage of left foot  Surgeon:  Aviva Signs, M.D.  Anes:  Regional  Indications:  Patient is an 83 year old white male multiple medical problems who was found on readmission to the hospital to have cellulitis of the left foot with gas between the third and fourth tarsal bones. The risks and benefits of the procedure were fully explained to the patient, who gave informed consent.  Procedure note:  The patient was placed in the supine position after an ankle block was provided by anesthesia. The left foot was prepped and draped using usual sterile technique with Betadine. Surgical site confirmation was performed.  A puncture wound was noted along the sole of the left foot. A linear area of ischemic-appearing soft tissue was noted emanating from the puncture wound. This was incised down to the soft tissue. Aerobic and anaerobic cultures were taken and sent to microbiology. Minimal amount of purulent fluid was noted. Some gangrenous soft tissue was noted and this was sharply debrided using scissors. Pulse lavage was then used to irrigate the wound. A Nu Gauze packing was then placed. A dry sterile dressing was then applied.  All tape and needle counts were correct at the end of the procedure. The patient was transferred to PACU in stable condition.  Complications:  None  EBL:  None  Specimen:  Aerobic and anaerobic cultures

## 2016-09-04 NOTE — Clinical Social Work Note (Signed)
Clinical Social Work Assessment  Patient Details  Name: [name] MRN: [id number] Date of Birth: Jan 10, 1931  Date of referral:  09/04/16               Reason for consult:  Discharge Planning                Permission sought to share information with:    Permission granted to share information::     Name::        Agency::     Relationship::     Contact Information:  Family and friends were bedside (daughter, Velva Harman; Jackquline [place]; and another friend).  Housing/Transportation Living arrangements for the past 2 months:  [place] of Information:  Patient, Adult Children Patient Interpreter Needed:  None Criminal Activity/Legal Involvement Pertinent to Current Situation/Hospitalization:  No - Comment as needed Significant Relationships:  Adult Children, Friend Lives with:  Self Do you feel safe going back to the place where you live?  Yes Need for family participation in patient care:  Yes (Comment)  Care giving concerns:  None identified at baseline.    Social Worker assessment / plan:  At baseline, patient was independent in all ADLs, drove his vehicle and ambulated independently.  He stated that before he stepped on the bolt he continued to paint. Patient states that will go to short term SNF.  His choices are [facility] and [facility].   Employment status:  Retired Nurse, adult PT Recommendations:  [place] / Referral to community resources:  [place]  Patient/Family's Response to care: Patient is agreeable to SNF.   Patient/Family's Understanding of and Emotional Response to Diagnosis, Current Treatment, and Prognosis:  Patient states that she understands his diagnosis, treatment and prognosis and hopes that SNF will help him get back to his baseline quickly.   Emotional Assessment Appearance:  Appears stated age Attitude/Demeanor/Rapport:    Affect (typically observed):  Accepting,  Calm Orientation:  Oriented to Situation, Oriented to  Time, Oriented to Place, Oriented to Self Alcohol / Substance use:    Psych involvement (Current and /or in the community):     Discharge Needs  Concerns to be addressed:  Discharge Planning Concerns Readmission within the last 30 days:  Yes Current discharge risk:  None Barriers to Discharge:  No Barriers Identified   Ihor Gully, LCSW 09/04/2016, 2:22 PM

## 2016-09-04 NOTE — Anesthesia Procedure Notes (Signed)
Anesthesia Regional Block: Ankle block   Pre-Anesthetic Checklist: ,, timeout performed, Correct Patient, Correct Site, Correct Laterality, Correct Procedure, Correct Position, site marked, Risks and benefits discussed, Surgical consent, At surgeon's request  Laterality: Left  Prep: chloraprep       Needles:  Injection technique: Single-shot     Needle Length: 4cm  Needle Gauge: 25   Needle insertion depth: 2 cm   Additional Needles:   Procedures:,,,, other,,,,  (landmark)  Motor weakness within 5 minutes.  Narrative:  Start time: 09/04/2016 9:50 AM End time: 09/04/2016 9:55 AM Anesthesiologist: Rufina Falco R  Additional Notes: Tbial Nerve 5cc, Saphenous Nerve 5cc, deep peroneal Nerve 5cc  (solution 50:50  0.25% bupivacaine, 2% Lidocaine)

## 2016-09-04 NOTE — Care Management Important Message (Signed)
Important Message  Patient Details  Name: [name] MRN: [id number] Date of Birth: [birth year]   Medicare Important Message Given:  Yes    Sherald Barge, RN 09/04/2016, 1:20 PM

## 2016-09-04 NOTE — Interval H&P Note (Signed)
History and Physical Interval Note:  09/04/2016 9:19 AM  [name]  has presented today for surgery, with the diagnosis of cellulitis,left foot  The various methods of treatment have been discussed with the patient and family. After consideration of risks, benefits and other options for treatment, the patient has consented to  Procedure(s): INCISION AND DRAINAGE ABSCESS (Left) as a surgical intervention .  The patient's history has been reviewed, patient examined, no change in status, stable for surgery.  I have reviewed the patient's chart and labs.  Questions were answered to the patient's satisfaction.     Aviva Signs

## 2016-09-04 NOTE — NC FL2 (Signed)
[place] MEDICAID FL2 LEVEL OF CARE SCREENING TOOL     IDENTIFICATION  Patient Name: [name] Birthdate: 10-27-1931 Sex: male Admission Date (Current Location): 09/01/2016  [facility] and [place] Number:  [employer] and Address:  [facility] [address] [place]      Provider Number: [id number]  Attending Physician Name and Address:  [name], MD  Relative Name and Phone Number:       Current Level of Care: Hospital Recommended Level of Care: [place] Prior Approval Number: [id number] ([id number])  Date Approved/Denied:   PASRR Number:    Discharge Plan: SNF    Current Diagnoses: Patient Active Problem List   Diagnosis Date Noted  . Diabetic foot infection ([place]) 09/01/2016  . Cellulitis 08/20/2016  . Type 2 diabetes mellitus with hyperglycemia, with long-term current use of insulin ([place]) 07/24/2016  . Peripheral edema   . Pressure injury of skin 06/11/2016  . Atrial fibrillation with normal ventricular rate ([place]) 06/10/2016  . Esophageal stricture 06/09/2016  . CAD in native artery 06/08/2016  . Chest pain 06/08/2016  . CKD (chronic kidney disease), stage III 06/08/2016  . Acute on chronic diastolic CHF (congestive heart failure) ([place]) 03/19/2016  . CAP (community acquired pneumonia) 03/19/2016  . Abnormal weight loss   . Cellulitis and abscess 12/18/2013  . Thrush, oral 12/18/2013  . Chest pain at rest 12/18/2013  . Leukocytosis 12/18/2013  . Diabetes ([place]) 12/18/2013  . Chronic back pain 12/18/2013  . Pulmonary vascular congestion 12/18/2013  . Dysphagia 12/18/2013  . Odynophagia 12/18/2013  . Gastric mass 09/07/2013  . Personal history of colonic polyps 08/05/2013  . Dysphagia, unspecified(787.20) 08/01/2013  . Nausea and vomiting 08/01/2013  . UTI (lower urinary tract infection) 12/09/2012  . Lower extremity edema 06/02/2012  . Dyspnea 06/02/2012  . Essential hypertension  06/02/2012  . History of DVT (deep vein thrombosis) 06/02/2012  . Obesity (BMI 30-39.9): BMI 31.3 06/02/2012    Orientation RESPIRATION BLADDER Height & Weight     Self, Time, Situation, Place  O2 (3L) Continent Weight: 206 lb 4.8 oz (93.6 kg) Height:  5' 10.5" (179.1 cm)  BEHAVIORAL SYMPTOMS/MOOD NEUROLOGICAL BOWEL NUTRITION STATUS      Continent Diet (Heart Healthy/Carb Modified)  AMBULATORY STATUS COMMUNICATION OF NEEDS Skin   Extensive Assist Verbally Surgical wounds (Left foot)                       Personal Care Assistance Level of Assistance  Bathing, Feeding, Dressing Bathing Assistance: Limited assistance Feeding assistance: Independent Dressing Assistance: Limited assistance     Functional Limitations Info  Sight, Hearing, Speech Sight Info: Adequate Hearing Info: Adequate Speech Info: Adequate    SPECIAL CARE FACTORS FREQUENCY  PT (By licensed PT)     PT Frequency: 5x/week              Contractures Contractures Info: Not present    Additional Factors Info  Code Status Code Status Info: DNR             Current Medications (09/04/2016):  This is the current hospital active medication list Current Facility-Administered Medications  Medication Dose Route Frequency Provider Last Rate Last Dose  . 0.9 %  sodium chloride infusion   Intravenous Continuous Fredia Sorrow, MD 75 mL/hr at 09/04/16 0844    . acetaminophen (TYLENOL) tablet 650 mg  650 mg Oral Q6H PRN Karmen Bongo, MD       Or  .  acetaminophen (TYLENOL) suppository 650 mg  650 mg Rectal Q6H PRN Karmen Bongo, MD      . clindamycin (CLEOCIN) IVPB 900 mg  900 mg Intravenous Lynne Logan, MD 100 mL/hr at 09/04/16 0800 900 mg at 09/04/16 0800  . cloNIDine (CATAPRES) tablet 0.1 mg  0.1 mg Oral BID Karmen Bongo, MD   0.1 mg at 09/04/16 1137  . docusate sodium (COLACE) capsule 100 mg  100 mg Oral BID Karmen Bongo, MD   100 mg at 09/04/16 1138  . enoxaparin (LOVENOX) injection  95 mg  95 mg Subcutaneous Q12H Karmen Bongo, MD   95 mg at 09/04/16 1138  . feeding supplement (GLUCERNA SHAKE) (GLUCERNA SHAKE) liquid 237 mL  237 mL Oral TID BM [name], MD   237 mL at 09/04/16 1400  . FLUoxetine (PROZAC) 20 MG/5ML solution 20 mg  20 mg Oral Daily [name], MD   20 mg at 09/03/16 1742  . hydrALAZINE (APRESOLINE) injection 5 mg  5 mg Intravenous Q4H PRN Karmen Bongo, MD      . HYDROcodone-acetaminophen (NORCO/VICODIN) 5-325 MG per tablet 1 tablet  1 tablet Oral Q6H PRN Aviva Signs, MD      . hydrocortisone cream 1 %   Topical TID PRN [name], MD      . insulin aspart (novoLOG) injection 0-20 Units  0-20 Units Subcutaneous TID WC Karmen Bongo, MD   3 Units at 09/04/16 1139  . insulin aspart (novoLOG) injection 0-5 Units  0-5 Units Subcutaneous QHS Karmen Bongo, MD   4 Units at 09/01/16 2139  . insulin glargine (LANTUS) injection 22 Units  22 Units Subcutaneous QHS Dondiego, Richard, MD      . ipratropium-albuterol (DUONEB) 0.5-2.5 (3) MG/3ML nebulizer solution 3 mL  3 mL Nebulization Q6H PRN Karmen Bongo, MD   3 mL at 09/03/16 1940  . lactated ringers infusion   Intravenous Continuous Karmen Bongo, MD 75 mL/hr at 09/04/16 0122    . MEDLINE mouth rinse  15 mL Mouth Rinse BID [name], MD   15 mL at 09/03/16 2214  . metoprolol succinate (TOPROL-XL) 24 hr tablet 75 mg  75 mg Oral Daily Karmen Bongo, MD   75 mg at 09/04/16 1139  . morphine 2 MG/ML injection 2 mg  2 mg Intravenous Q2H PRN Aviva Signs, MD      . ondansetron [facility]) tablet 4 mg  4 mg Oral Q6H PRN Karmen Bongo, MD       Or  . ondansetron [facility]) injection 4 mg  4 mg Intravenous Q6H PRN Karmen Bongo, MD   4 mg at 09/02/16 1602  . oxyCODONE (Oxy IR/ROXICODONE) immediate release tablet 30 mg  30 mg Oral Q8H PRN Karmen Bongo, MD   30 mg at 09/02/16 2002  . pantoprazole (PROTONIX) EC tablet 40 mg  40 mg Oral Daily Karmen Bongo, MD   40 mg at 09/04/16  1140  . piperacillin-tazobactam (ZOSYN) IVPB 3.375 g  3.375 g Intravenous Lynne Logan, MD 100 mL/hr at 09/04/16 0800 3.375 g at 09/04/16 0800  . rosuvastatin (CRESTOR) tablet 10 mg  10 mg Oral Daily Karmen Bongo, MD   10 mg at 09/04/16 1140  . sucralfate (CARAFATE) tablet 1 g  1 g Oral TID WC & HS Karmen Bongo, MD   1 g at 09/04/16 1140  . tamsulosin (FLOMAX) capsule 0.4 mg  0.4 mg Oral QPC breakfast Karmen Bongo, MD   0.4 mg at 09/04/16 1140  . timolol (TIMOPTIC) 0.5 %  ophthalmic solution 1 drop  1 drop Left Eye Daily Karmen Bongo, MD   1 drop at 09/04/16 1137  . vancomycin (VANCOCIN) IVPB 1000 mg/200 mL premix  1,000 mg Intravenous Q24H [name], MD         Discharge Medications: Please see discharge summary for a list of discharge medications.  Relevant Imaging Results:  Relevant Lab Results:   Additional Information SSN [id number], Clydene Pugh, LCSW

## 2016-09-04 NOTE — Progress Notes (Signed)
Physical Therapy Treatment Patient Details Name: [name] MRN: [id number] DOB: August 02, 1931 Today's Date: 09/04/2016    History of Present Illness [name] is a 83 y.o. male with medical history significant of dCHF, COPD, CKD 3, HTN, CAD, DM2, HLD, BPH who was hospitalized from 8/16 - 23 for a diabetic foot infection; he was discharged on Clindamycin.  Since discharge, he has had esophageal pain, n/v, weakness, and has been unable to eat.  He did not feel good when he left, unable to stand up.  He refused SNF rehab with last discharge; he now recognizes that this might be necessary.  +fever, subjective; + chills.  This started after he stepped on a bolt - he reports that it was NOT better at the time of last discharge.  He has continued to go "downhill:" since he was admitted last time.  He reports that he did take Clinda following last hospitalization.      PT Comments    Patient demonstrates slow labored functional mobility for sitting up at bedside, transfers to wheelchair, Min assist x 20 feet x 2 trials for w/c mobility in hallway while on 4 LPM, able to take a few steps for bed<>w/c transfers with PWB on LLE due to increasing pain with pressure.  Patient will benefit from continued physical therapy in hospital and recommended venue below to increase strength, balance, endurance for safe ADLs and gait.    Follow Up Recommendations  SNF     Equipment Recommendations  Wheelchair (measurements PT)    Recommendations for Other Services       Precautions / Restrictions Precautions Precautions: Fall Precaution Comments: chronic left foot wound/pain Restrictions Weight Bearing Restrictions: Yes Other Position/Activity Restrictions: s/p I&D left foot 09/04/16, PWB LEFT foot for pain control    Mobility  Bed Mobility Overal bed mobility: Needs Assistance Bed Mobility: Rolling Rolling: Min assist   Supine to sit: Min assist Sit to supine: Min assist       Transfers Overall transfer level: Needs assistance Equipment used: Rolling walker (2 wheeled) Transfers: Sit to/from Stand Sit to Stand: Mod assist         General transfer comment: very unsteady with limited tolerance for weight bearing on LLE  Ambulation/Gait Ambulation/Gait assistance: Mod assist Ambulation Distance (Feet): 4 Feet Assistive device: Rolling walker (2 wheeled) Gait Pattern/deviations: Decreased step length - left;Decreased stance time - left   Gait velocity interpretation: Below normal speed for age/gender General Gait Details: Limited to a few steps for transferring to wheelchair   Stairs            Wheelchair Mobility    Modified Rankin (Stroke Patients Only)       Balance Overall balance assessment: Needs assistance Sitting-balance support: Feet supported;No upper extremity supported Sitting balance-Leahy Scale: Fair     Standing balance support: Bilateral upper extremity supported;During functional activity Standing balance-Leahy Scale: Poor                              Cognition Arousal/Alertness: Awake/alert Behavior During Therapy: WFL for tasks assessed/performed Overall Cognitive Status: Within Functional Limits for tasks assessed                                        Exercises General Exercises - Lower Extremity Ankle Circles/Pumps: AROM;Right;10 reps;Left;Both;Supine Long Arc Quad: Seated;AROM;Both;10 reps Hip  Flexion/Marching: Seated;AROM;Both;10 reps    General Comments        Pertinent Vitals/Pain Pain Assessment: Faces Faces Pain Scale: Hurts even more Pain Location: left foot Pain Descriptors / Indicators: Pressure;Guarding Pain Intervention(s): Limited activity within patient's tolerance;Monitored during session    Home Living                      Prior Function            PT Goals (current goals can now be found in the care plan section) Acute Rehab PT  Goals Patient Stated Goal: return home after rehab PT Goal Formulation: With patient Time For Goal Achievement: 09/09/16 Potential to Achieve Goals: Good Progress towards PT goals: Progressing toward goals    Frequency    Min 3X/week      PT Plan Current plan remains appropriate    Co-evaluation              AM-PAC PT "6 Clicks" Daily Activity  Outcome Measure  Difficulty turning over in bed (including adjusting bedclothes, sheets and blankets)?: Unable Difficulty moving from lying on back to sitting on the side of the bed? : Unable Difficulty sitting down on and standing up from a chair with arms (e.g., wheelchair, bedside commode, etc,.)?: Unable Help needed moving to and from a bed to chair (including a wheelchair)?: A Lot Help needed walking in hospital room?: A Lot Help needed climbing 3-5 steps with a railing? : Total 6 Click Score: 8    End of Session Equipment Utilized During Treatment: Oxygen Activity Tolerance: Patient tolerated treatment well;Patient limited by fatigue Patient left: in bed;with call bell/phone within reach;with family/visitor present Nurse Communication: Mobility status PT Visit Diagnosis: Unsteadiness on feet (R26.81);Other abnormalities of gait and mobility (R26.89);Muscle weakness (generalized) (M62.81) Pain - Right/Left: Left Pain - part of body: Ankle and joints of foot     Time: 0017-4944 PT Time Calculation (min) (ACUTE ONLY): 41 min  Charges:  $Therapeutic Exercise: 8-22 mins $Therapeutic Activity: 23-37 mins                    G Codes:       4:10 PM, 02-Oct-2016 Lonell Grandchild, MPT Physical Therapist with [facility] 336 [phone] office [phone] mobile phone

## 2016-09-04 NOTE — Progress Notes (Signed)
Status post IND with pulse lavage of left foot between third and fourth metatarsals currently on multiple antibiotics foot draining lab reports within normal parameters patient hemodynamically stable [name] POE:[id number] DOB: October 19, 1931 DOA: 09/01/2016 PCP: [name], MD   Physical Exam: Blood pressure 124/64, pulse 97, temperature 98.5 F (36.9 C), resp. rate 14, height 5' 10.5" (1.791 m), weight 93.6 kg (206 lb 4.8 oz), SpO2 94 %. Lungs diminished breath sounds in the bases mild end expiratory wheeze no rales audible heart irregular rhythm no S3 no heaves thrills rubs. Maintain Lovenox for present   Investigations:  Recent Results (from the past 240 hour(s))  Blood culture (routine x 2)     Status: None (Preliminary result)   Collection Time: 09/01/16 11:00 AM  Result Value Ref Range Status   Specimen Description RIGHT ANTECUBITAL  Final   Special Requests   Final    BOTTLES DRAWN AEROBIC AND ANAEROBIC Blood Culture adequate volume   Culture NO GROWTH 3 DAYS  Final   Report Status PENDING  Incomplete  Blood culture (routine x 2)     Status: None (Preliminary result)   Collection Time: 09/01/16 11:08 AM  Result Value Ref Range Status   Specimen Description BLOOD RIGHT WRIST  Final   Special Requests   Final    BOTTLES DRAWN AEROBIC AND ANAEROBIC Blood Culture adequate volume   Culture NO GROWTH 3 DAYS  Final   Report Status PENDING  Incomplete  Surgical pcr screen     Status: None   Collection Time: 09/03/16 11:33 AM  Result Value Ref Range Status   MRSA, PCR NEGATIVE NEGATIVE Final   Staphylococcus aureus NEGATIVE NEGATIVE Final    Comment: (NOTE) The Xpert SA Assay (FDA approved for NASAL specimens in patients 81 years of age and older), is one component of a comprehensive surveillance program. It is not intended to diagnose infection nor to guide or monitor treatment.      Basic Metabolic Panel:  Recent Labs  09/03/16 0609 09/04/16 0606  NA 139 137   K 3.8 4.2  CL 101 101  CO2 28 29  GLUCOSE 55* 130*  BUN 26* 24*  CREATININE 1.47* 1.52*  CALCIUM 10.0 10.0   Liver Function Tests: No results for input(s): AST, ALT, ALKPHOS, BILITOT, PROT, ALBUMIN in the last 72 hours.   CBC:  Recent Labs  09/02/16 0435  WBC 24.4*  HGB 10.9*  HCT 34.2*  MCV 86.6  PLT 299    No results found.    Medications:   Impression:  Principal Problem:   Diabetic foot infection ([place]) Active Problems:   Essential hypertension   Nausea and vomiting   Diabetes ([place])   CKD (chronic kidney disease), stage III   Esophageal stricture   Atrial fibrillation with normal ventricular rate ([facility])     Plan: In culture and sensitivity intraoperatively  Consultants: General surgery   Procedures IND with pulse lavage of left foot   Antibiotics: Multiple           Time spent: 30 minutes   LOS: 3 days   [name] M   09/04/2016, 12:39 PM

## 2016-09-04 NOTE — Addendum Note (Signed)
Addendum  created 09/04/16 1707 by Vista Deck, CRNA   Sign clinical note

## 2016-09-04 NOTE — Anesthesia Postprocedure Evaluation (Signed)
Anesthesia Post Note  Patient: [name]  Procedure(s) Performed: Procedure(s) (LRB): INCISION AND DRAINAGE ABSCESS (Left)  Patient location during evaluation: Nursing Unit Anesthesia Type: Regional Level of consciousness: awake and alert Pain management: satisfactory to patient Vital Signs Assessment: post-procedure vital signs reviewed and stable Respiratory status: spontaneous breathing and patient connected to nasal cannula oxygen Cardiovascular status: stable Postop Assessment: no signs of nausea or vomiting and adequate PO intake Anesthetic complications: no Comments: Labs reviewed. Blood sugar noted.     Last Vitals:  Vitals:   09/04/16 1100 09/04/16 1413  BP: 124/64 (!) 118/49  Pulse: 97 80  Resp: 14 16  Temp:  (!) 36.4 C  SpO2: 94% 100%    Last Pain:  Vitals:   09/04/16 1413  TempSrc: Oral  PainSc:                  Drucie Opitz

## 2016-09-04 NOTE — Care Management Note (Signed)
Case Management Note  Patient Details  Name: [name] MRN: [id number] Date of Birth: 1931-03-20  Expected Discharge Date:     09/07/2016             Expected Discharge Plan:  [place]  In-House Referral:  Clinical Social Work  Discharge planning Services  CM Consult  Status of Service:  Completed, signed off  Additional Comments: Anticipate DC to SNF over weekend. CSW aware of SNF recommendation and will make placement arrangements. No CM needs noted at this time. CM will sign off.   Sherald Barge, RN 09/04/2016, 1:19 PM

## 2016-09-04 NOTE — H&P (View-Only) (Signed)
Subjective: Patient complains of left foot pain.  Objective: Vital signs in last 24 hours: Temp:  [97.7 F (36.5 C)-98.3 F (36.8 C)] 98.3 F (36.8 C) (08/30 0516) Pulse Rate:  [81-93] 93 (08/30 0516) Resp:  [14-20] 14 (08/30 0516) BP: (114-131)/(57-62) 127/62 (08/30 0516) SpO2:  [93 %-99 %] 99 % (08/30 0516) Last BM Date: 09/01/16  Intake/Output from previous day: 08/29 0701 - 08/30 0700 In: 2570.8 [P.O.:240; I.V.:1480.8; IV Piggyback:850] Out: 250 [Urine:250] Intake/Output this shift: No intake/output data recorded.  General appearance: alert, cooperative and no distress Extremities: Left foot still swollen with ischemic changes in a linear fashion along the sole of the foot. Erythema is present. No open ulcerations noted.  Lab Results:   Recent Labs  09/01/16 1100 09/02/16 0435  WBC 28.7* 24.4*  HGB 11.8* 10.9*  HCT 37.1* 34.2*  PLT 303 299   BMET  Recent Labs  09/02/16 0435 09/03/16 0609  NA 135 139  K 4.3 3.8  CL 99* 101  CO2 25 28  GLUCOSE 296* 55*  BUN 31* 26*  CREATININE 1.34* 1.47*  CALCIUM 10.0 10.0   PT/INR No results for input(s): LABPROT, INR in the last 72 hours.  Studies/Results: Dg Chest 2 View  Result Date: 09/01/2016 CLINICAL DATA:  Generalized abdominal and chest pain. EXAM: CHEST  2 VIEW COMPARISON:  08/27/2016. FINDINGS: Stable cardiomegaly with aortic atherosclerosis. Status post CABG. Mild interstitial edema with atelectasis at the lung bases. No pneumonic consolidation, effusion or pneumothorax. No acute osseous abnormality. IMPRESSION: 1. Stable cardiomegaly with aortic atherosclerosis and post CABG change. 2. Bibasilar atelectasis with minimal interstitial edema. Electronically Signed   By: Ashley Royalty M.D.   On: 09/01/2016 14:28   US Arterial Seg Single  Result Date: 09/02/2016 CLINICAL DATA:  83 year old male with left foot puncture wound and cellulitis. EXAM: NONINVASIVE PHYSIOLOGIC VASCULAR STUDY OF BILATERAL LOWER  EXTREMITIES TECHNIQUE: Evaluation of both lower extremities was performed at rest, including calculation of ankle-brachial indices, multiple segmental pressure evaluation, segmental Doppler and segmental pulse volume recording. COMPARISON:  None. FINDINGS: Right ABI:  1.0 Left ABI:  1.2 Right Lower Extremity: Normal posterior tibial arterial waveform. The dorsalis pedis waveform is slightly abnormal suggesting an element of anterior tibial or small vessel disease. Left Lower Extremity: Normal posterior tibial waveform. The dorsalis pedis waveform is slightly abnormal suggesting anterior tibial or small vessel disease. IMPRESSION: Normal bilateral resting ankle-brachial indices. Signed, Criselda Peaches, MD [facility] Radiology Electronically Signed   By: Jacqulynn Cadet M.D.   On: 09/02/2016 11:10   Dg Foot Complete Left  Result Date: 09/01/2016 CLINICAL DATA:  Left foot redness. EXAM: LEFT FOOT - COMPLETE 3+ VIEW COMPARISON:  08/20/2016 FINDINGS: There is no evidence of fracture or dislocation. There is no periosteal reaction or bone destruction. There soft tissue emphysema between the third and fourth metatarsals concerning for infection. IMPRESSION: 1. No evidence of osteomyelitis of the left foot. 2. Soft tissue emphysema between the third and fourth metatarsals concerning for necrotizing infection. Electronically Signed   By: Kathreen Devoid   On: 09/01/2016 14:29    Anti-infectives: Anti-infectives    Start     Dose/Rate Route Frequency Ordered Stop   09/01/16 2330  piperacillin-tazobactam (ZOSYN) IVPB 3.375 g     3.375 g 12.5 mL/hr over 240 Minutes Intravenous Every 8 hours 09/01/16 2317     09/01/16 2300  vancomycin (VANCOCIN) 1,500 mg in sodium chloride 0.9 % 500 mL IVPB  1,500 mg 250 mL/hr over 120 Minutes Intravenous Every 24 hours 09/01/16 1848     09/01/16 2300  clindamycin (CLEOCIN) IVPB 900 mg     900 mg 100 mL/hr over 30 Minutes  Intravenous Every 8 hours 09/01/16 2259     09/01/16 1900  ceFEPIme (MAXIPIME) 2 g in dextrose 5 % 50 mL IVPB  Status:  Discontinued     2 g 100 mL/hr over 30 Minutes Intravenous Every 24 hours 09/01/16 1848 09/01/16 2259   09/01/16 1845  metroNIDAZOLE (FLAGYL) IVPB 500 mg  Status:  Discontinued     500 mg 100 mL/hr over 60 Minutes Intravenous Every 8 hours 09/01/16 1822 09/01/16 2258   09/01/16 1200  vancomycin (VANCOCIN) IVPB 1000 mg/200 mL premix     1,000 mg 200 mL/hr over 60 Minutes Intravenous  Once 09/01/16 1149 09/01/16 1317      Assessment/Plan: Impression: Cellulitis with gas present, left foot, diabetes mellitus. No significant improvement. Plan: Will take patient to the operating room tomorrow for incision and drainage of abscess in the left foot. The risks and benefits of the procedure were fully explained to the patient, who gave informed consent.   LOS: 2 days    Aviva Signs 09/03/2016

## 2016-09-04 NOTE — Transfer of Care (Signed)
Immediate Anesthesia Transfer of Care Note  Patient: [name]  Procedure(s) Performed: Procedure(s): INCISION AND DRAINAGE ABSCESS (Left)  Patient Location: PACU  Anesthesia Type:MAC  Level of Consciousness: awake, alert  and oriented  Airway & Oxygen Therapy: Patient Spontanous Breathing and Patient connected to nasal cannula oxygen  Post-op Assessment: Report given to RN  Post vital signs: Reviewed and stable  Last Vitals:  Vitals:   09/04/16 0845 09/04/16 1035  BP:  (P) 121/75  Pulse:  (P) 94  Resp: (!) 28 (P) 16  Temp:  (P) 36.9 C  SpO2: 100% (P) 93%    Last Pain:  Vitals:   09/04/16 0759  TempSrc: Oral  PainSc: 9       Patients Stated Pain Goal: 9 (31/51/76 1607)  Complications: No apparent anesthesia complications

## 2016-09-04 NOTE — Anesthesia Postprocedure Evaluation (Signed)
Anesthesia Post Note  Patient: [name]  Procedure(s) Performed: Procedure(s) (LRB): INCISION AND DRAINAGE ABSCESS (Left)  Patient location during evaluation: PACU Anesthesia Type: Regional Level of consciousness: awake and alert and oriented Pain management: pain level controlled Vital Signs Assessment: post-procedure vital signs reviewed and stable Respiratory status: spontaneous breathing Cardiovascular status: blood pressure returned to baseline and stable Postop Assessment: no signs of nausea or vomiting Anesthetic complications: no     Last Vitals:  Vitals:   09/04/16 1045 09/04/16 1100  BP: 120/85 124/64  Pulse: 85 97  Resp: 15 14  Temp:    SpO2: 95% 94%    Last Pain:  Vitals:   09/04/16 1100  TempSrc:   PainSc: Asleep                 Gillie Fleites

## 2016-09-05 LAB — CBC
HEMATOCRIT: 34.4 % — AB (ref 39.0–52.0)
Hemoglobin: 10.8 g/dL — ABNORMAL LOW (ref 13.0–17.0)
MCH: 27.5 pg (ref 26.0–34.0)
MCHC: 31.4 g/dL (ref 30.0–36.0)
MCV: 87.5 fL (ref 78.0–100.0)
Platelets: 271 10*3/uL (ref 150–400)
RBC: 3.93 MIL/uL — AB (ref 4.22–5.81)
RDW: 14.2 % (ref 11.5–15.5)
WBC: 28.4 10*3/uL — AB (ref 4.0–10.5)

## 2016-09-05 LAB — GLUCOSE, CAPILLARY
GLUCOSE-CAPILLARY: 181 mg/dL — AB (ref 65–99)
GLUCOSE-CAPILLARY: 160 mg/dL — AB (ref 65–99)
Glucose-Capillary: 225 mg/dL — ABNORMAL HIGH (ref 65–99)

## 2016-09-05 LAB — BASIC METABOLIC PANEL
ANION GAP: 9 (ref 5–15)
BUN: 27 mg/dL — ABNORMAL HIGH (ref 6–20)
CO2: 27 mmol/L (ref 22–32)
Calcium: 10 mg/dL (ref 8.9–10.3)
Chloride: 101 mmol/L (ref 101–111)
Creatinine, Ser: 1.59 mg/dL — ABNORMAL HIGH (ref 0.61–1.24)
GFR calc Af Amer: 45 mL/min — ABNORMAL LOW (ref >60)
GFR, EST NON AFRICAN AMERICAN: 38 mL/min — AB (ref >60)
GLUCOSE: 210 mg/dL — AB (ref 65–99)
POTASSIUM: 4.2 mmol/L (ref 3.5–5.1)
SODIUM: 137 mmol/L (ref 135–145)

## 2016-09-05 MED ORDER — ALUM & MAG HYDROXIDE-SIMETH 200-200-20 MG/5ML PO SUSP
30.0000 mL | ORAL | Status: DC | PRN
  Administered 2016-09-05 – 2016-09-06 (×2): 30 mL via ORAL
  Filled 2016-09-05 (×2): qty 30

## 2016-09-05 NOTE — Progress Notes (Signed)
1 Day Post-Op  Subjective: Patient has no incisional pain.  Objective: Vital signs in last 24 hours: Temp:  [97.5 F (36.4 C)-98.5 F (36.9 C)] 98.2 F (36.8 C) (09/01 0600) Pulse Rate:  [80-97] 83 (09/01 0600) Resp:  [14-20] 16 (09/01 0600) BP: (118-151)/(49-85) 151/62 (09/01 0600) SpO2:  [93 %-100 %] 100 % (09/01 0600) Last BM Date: 09/02/16  Intake/Output from previous day: 08/31 0701 - 09/01 0700 In: 2325 [P.O.:240; I.V.:1585; IV Piggyback:500] Out: 410 [Urine:400; Blood:10] Intake/Output this shift: No intake/output data recorded.  General appearance: alert, cooperative and no distress Extremities: Left foot dressing dry and intact. Toes are nonischemic.  Lab Results:  No results for input(s): WBC, HGB, HCT, PLT in the last 72 hours. BMET  Recent Labs  09/04/16 0606 09/05/16 0609  NA 137 137  K 4.2 4.2  CL 101 101  CO2 29 27  GLUCOSE 130* 210*  BUN 24* 27*  CREATININE 1.52* 1.59*  CALCIUM 10.0 10.0   PT/INR No results for input(s): LABPROT, INR in the last 72 hours.  Studies/Results: No results found.  Anti-infectives: Anti-infectives    Start     Dose/Rate Route Frequency Ordered Stop   09/04/16 1600  vancomycin (VANCOCIN) IVPB 1000 mg/200 mL premix     1,000 mg 200 mL/hr over 60 Minutes Intravenous Every 24 hours 09/04/16 1205     09/01/16 2330  piperacillin-tazobactam (ZOSYN) IVPB 3.375 g     3.375 g 12.5 mL/hr over 240 Minutes Intravenous Every 8 hours 09/01/16 2317     09/01/16 2300  vancomycin (VANCOCIN) 1,500 mg in sodium chloride 0.9 % 500 mL IVPB  Status:  Discontinued     1,500 mg 250 mL/hr over 120 Minutes Intravenous Every 24 hours 09/01/16 1848 09/04/16 1205   09/01/16 2300  clindamycin (CLEOCIN) IVPB 900 mg     900 mg 100 mL/hr over 30 Minutes Intravenous Every 8 hours 09/01/16 2259     09/01/16 1900  ceFEPIme (MAXIPIME) 2 g in dextrose 5 % 50 mL IVPB  Status:  Discontinued     2 g 100 mL/hr over 30 Minutes Intravenous Every 24  hours 09/01/16 1848 09/01/16 2259   09/01/16 1845  metroNIDAZOLE (FLAGYL) IVPB 500 mg  Status:  Discontinued     500 mg 100 mL/hr over 60 Minutes Intravenous Every 8 hours 09/01/16 1822 09/01/16 2258   09/01/16 1200  vancomycin (VANCOCIN) IVPB 1000 mg/200 mL premix     1,000 mg 200 mL/hr over 60 Minutes Intravenous  Once 09/01/16 1149 09/01/16 1317      Assessment/Plan: s/p Procedure(s): INCISION AND DRAINAGE ABSCESS Impression: Stable on postoperative day 1. White blood cell count pending. Will take dressing down in a.m.  LOS: 4 days    Aviva Signs 09/05/2016

## 2016-09-05 NOTE — Progress Notes (Signed)
Morning blood work not yet available patient stated day to IND of foot for cellulitis on multiple antibiotics for aerobic and anaerobics. Intraoperative cultures still pending [name] HYW:[id number] DOB: 1931-11-29 DOA: 09/01/2016 PCP: [name], MD   Physical Exam: Blood pressure (!) 151/62, pulse 83, temperature 98.2 F (36.8 C), temperature source Oral, resp. rate 16, height 5' 10.5" (1.791 m), weight 93.6 kg (206 lb 4.8 oz), SpO2 100 %. Lungs diminished breath sounds in bases no rales wheeze rhonchi appreciable heart regular rhythm no S3  no heaves thrills or rubs   Investigations:  Recent Results (from the past 240 hour(s))  Blood culture (routine x 2)     Status: None (Preliminary result)   Collection Time: 09/01/16 11:00 AM  Result Value Ref Range Status   Specimen Description RIGHT ANTECUBITAL  Final   Special Requests   Final    BOTTLES DRAWN AEROBIC AND ANAEROBIC Blood Culture adequate volume   Culture NO GROWTH 3 DAYS  Final   Report Status PENDING  Incomplete  Blood culture (routine x 2)     Status: None (Preliminary result)   Collection Time: 09/01/16 11:08 AM  Result Value Ref Range Status   Specimen Description BLOOD RIGHT WRIST  Final   Special Requests   Final    BOTTLES DRAWN AEROBIC AND ANAEROBIC Blood Culture adequate volume   Culture NO GROWTH 3 DAYS  Final   Report Status PENDING  Incomplete  Surgical pcr screen     Status: None   Collection Time: 09/03/16 11:33 AM  Result Value Ref Range Status   MRSA, PCR NEGATIVE NEGATIVE Final   Staphylococcus aureus NEGATIVE NEGATIVE Final    Comment: (NOTE) The Xpert SA Assay (FDA approved for NASAL specimens in patients 81 years of age and older), is one component of a comprehensive surveillance program. It is not intended to diagnose infection nor to guide or monitor treatment.   Aerobic/Anaerobic Culture (surgical/deep wound)     Status: None (Preliminary result)   Collection Time: 09/04/16 10:16 AM   Result Value Ref Range Status   Specimen Description FOOT LEFT  Final   Special Requests NONE  Final   Gram Stain   Final    NO WBC SEEN RARE SQUAMOUS EPITHELIAL CELLS PRESENT FEW GRAM POSITIVE COCCI IN PAIRS RARE GRAM NEGATIVE RODS Performed at [facility], [place] [address], [place], [place] [address]    Culture PENDING  Incomplete   Report Status PENDING  Incomplete     Basic Metabolic Panel:  Recent Labs  09/03/16 0609 09/04/16 0606  NA 139 137  K 3.8 4.2  CL 101 101  CO2 28 29  GLUCOSE 55* 130*  BUN 26* 24*  CREATININE 1.47* 1.52*  CALCIUM 10.0 10.0   Liver Function Tests: No results for input(s): AST, ALT, ALKPHOS, BILITOT, PROT, ALBUMIN in the last 72 hours.   CBC: No results for input(s): WBC, NEUTROABS, HGB, HCT, MCV, PLT in the last 72 hours.  No results found.    Medication  Impression:  Principal Problem:   Diabetic foot infection ([place]) Active Problems:   Essential hypertension   Nausea and vomiting   Diabetes ([place])   CKD (chronic kidney disease), stage III   Esophageal stricture   Atrial fibrillation with normal ventricular rate ([facility])     Plan: Continue antibiotics. Await intraoperative cultures. Mobilize patient if possible   Consultants: General surgery   Procedures   Antibiotics: Multiple  Time spent: 30 minutes   LOS: 4 days   Kery Batzel M   09/05/2016, 7:26 AM

## 2016-09-06 LAB — GLUCOSE, CAPILLARY
GLUCOSE-CAPILLARY: 159 mg/dL — AB (ref 65–99)
Glucose-Capillary: 200 mg/dL — ABNORMAL HIGH (ref 65–99)
Glucose-Capillary: 184 mg/dL — ABNORMAL HIGH (ref 65–99)
Glucose-Capillary: 208 mg/dL — ABNORMAL HIGH (ref 65–99)

## 2016-09-06 LAB — CULTURE, BLOOD (ROUTINE X 2)
CULTURE: NO GROWTH
Culture: NO GROWTH
Special Requests: ADEQUATE
Special Requests: ADEQUATE

## 2016-09-06 LAB — BASIC METABOLIC PANEL
Anion gap: 9 (ref 5–15)
BUN: 21 mg/dL — AB (ref 6–20)
CHLORIDE: 100 mmol/L — AB (ref 101–111)
CO2: 28 mmol/L (ref 22–32)
CREATININE: 1.27 mg/dL — AB (ref 0.61–1.24)
Calcium: 10.1 mg/dL (ref 8.9–10.3)
GFR calc Af Amer: 59 mL/min — ABNORMAL LOW (ref >60)
GFR calc non Af Amer: 50 mL/min — ABNORMAL LOW (ref >60)
Glucose, Bld: 159 mg/dL — ABNORMAL HIGH (ref 65–99)
Potassium: 3.7 mmol/L (ref 3.5–5.1)
SODIUM: 137 mmol/L (ref 135–145)

## 2016-09-06 MED ORDER — LORAZEPAM 1 MG PO TABS
1.0000 mg | ORAL_TABLET | Freq: Every day | ORAL | Status: DC
  Administered 2016-09-06 – 2016-09-08 (×3): 1 mg via ORAL
  Filled 2016-09-06 (×3): qty 1

## 2016-09-06 MED ORDER — POVIDONE-IODINE 10 % EX SOLN
CUTANEOUS | Status: DC | PRN
  Administered 2016-09-07: 14:00:00 via TOPICAL
  Filled 2016-09-06: qty 118

## 2016-09-06 NOTE — Progress Notes (Signed)
2 Days Post-Op  Subjective: No complaints of pain.  Objective: Vital signs in last 24 hours: Temp:  [98 F (36.7 C)-98.4 F (36.9 C)] 98.2 F (36.8 C) (09/02 0516) Pulse Rate:  [85-87] 87 (09/02 0516) Resp:  [18] 18 (09/02 0516) BP: (161-174)/(75-89) 168/79 (09/02 0516) SpO2:  [99 %-100 %] 99 % (09/02 0516) Last BM Date: 09/06/16  Intake/Output from previous day: 09/01 0701 - 09/02 0700 In: 3485 [P.O.:960; I.V.:2025; IV Piggyback:500] Out: 500 [Urine:500] Intake/Output this shift: Total I/O In: 120 [P.O.:120] Out: 100 [Urine:100]  General appearance: alert, cooperative and no distress Extremities: Left foot without drainage. Packing removed. Still slightly swollen. No significant ischemic changes of toes.  Lab Results:   Recent Labs  09/05/16 0644  WBC 28.4*  HGB 10.8*  HCT 34.4*  PLT 271   BMET  Recent Labs  09/05/16 0609 09/06/16 0549  NA 137 137  K 4.2 3.7  CL 101 100*  CO2 27 28  GLUCOSE 210* 159*  BUN 27* 21*  CREATININE 1.59* 1.27*  CALCIUM 10.0 10.1   PT/INR No results for input(s): LABPROT, INR in the last 72 hours.  Studies/Results: No results found.  Anti-infectives: Anti-infectives    Start     Dose/Rate Route Frequency Ordered Stop   09/04/16 1600  vancomycin (VANCOCIN) IVPB 1000 mg/200 mL premix     1,000 mg 200 mL/hr over 60 Minutes Intravenous Every 24 hours 09/04/16 1205     09/01/16 2330  piperacillin-tazobactam (ZOSYN) IVPB 3.375 g     3.375 g 12.5 mL/hr over 240 Minutes Intravenous Every 8 hours 09/01/16 2317     09/01/16 2300  vancomycin (VANCOCIN) 1,500 mg in sodium chloride 0.9 % 500 mL IVPB  Status:  Discontinued     1,500 mg 250 mL/hr over 120 Minutes Intravenous Every 24 hours 09/01/16 1848 09/04/16 1205   09/01/16 2300  clindamycin (CLEOCIN) IVPB 900 mg     900 mg 100 mL/hr over 30 Minutes Intravenous Every 8 hours 09/01/16 2259     09/01/16 1900  ceFEPIme (MAXIPIME) 2 g in dextrose 5 % 50 mL IVPB  Status:   Discontinued     2 g 100 mL/hr over 30 Minutes Intravenous Every 24 hours 09/01/16 1848 09/01/16 2259   09/01/16 1845  metroNIDAZOLE (FLAGYL) IVPB 500 mg  Status:  Discontinued     500 mg 100 mL/hr over 60 Minutes Intravenous Every 8 hours 09/01/16 1822 09/01/16 2258   09/01/16 1200  vancomycin (VANCOCIN) IVPB 1000 mg/200 mL premix     1,000 mg 200 mL/hr over 60 Minutes Intravenous  Once 09/01/16 1149 09/01/16 1317      Assessment/Plan: s/p Procedure(s): INCISION AND DRAINAGE ABSCESS Impression: Stable on postoperative day 2. We will recheck white blood cell count. Have written wound care orders.  LOS: 5 days    Aviva Signs 09/06/2016

## 2016-09-06 NOTE — Progress Notes (Signed)
Appreciate surgical wound care orders written creatinine stable [name]:[id number] DOB: 1931-07-17 DOA: 09/01/2016 PCP: [name], MD   Physical Exam: Blood pressure (!) 168/79, pulse 87, temperature 98.2 F (36.8 C), temperature source Oral, resp. rate 18, height 5' 10.5" (1.791 m), weight 93.6 kg (206 lb 4.8 oz), SpO2 99 %. Lungs clear to A&P no rales wheeze rhonchi appreciable heart regular rhythm no S3-S4 knee feels rubs   Investigations:  Recent Results (from the past 240 hour(s))  Blood culture (routine x 2)     Status: None   Collection Time: 09/01/16 11:00 AM  Result Value Ref Range Status   Specimen Description RIGHT ANTECUBITAL  Final   Special Requests   Final    BOTTLES DRAWN AEROBIC AND ANAEROBIC Blood Culture adequate volume   Culture NO GROWTH 5 DAYS  Final   Report Status 09/06/2016 FINAL  Final  Blood culture (routine x 2)     Status: None   Collection Time: 09/01/16 11:08 AM  Result Value Ref Range Status   Specimen Description BLOOD RIGHT WRIST  Final   Special Requests   Final    BOTTLES DRAWN AEROBIC AND ANAEROBIC Blood Culture adequate volume   Culture NO GROWTH 5 DAYS  Final   Report Status 09/06/2016 FINAL  Final  Surgical pcr screen     Status: None   Collection Time: 09/03/16 11:33 AM  Result Value Ref Range Status   MRSA, PCR NEGATIVE NEGATIVE Final   Staphylococcus aureus NEGATIVE NEGATIVE Final    Comment: (NOTE) The Xpert SA Assay (FDA approved for NASAL specimens in patients 1 years of age and older), is one component of a comprehensive surveillance program. It is not intended to diagnose infection nor to guide or monitor treatment.   Aerobic/Anaerobic Culture (surgical/deep wound)     Status: None (Preliminary result)   Collection Time: 09/04/16 10:16 AM  Result Value Ref Range Status   Specimen Description FOOT LEFT  Final   Special Requests NONE  Final   Gram Stain   Final    NO WBC SEEN RARE SQUAMOUS EPITHELIAL CELLS  PRESENT FEW GRAM POSITIVE COCCI IN PAIRS RARE GRAM NEGATIVE RODS    Culture   Final    ABUNDANT ENTEROCOCCUS FAECALIS FEW KLEBSIELLA OXYTOCA    Report Status PENDING  Incomplete   Organism ID, Bacteria ENTEROCOCCUS FAECALIS  Final   Organism ID, Bacteria KLEBSIELLA OXYTOCA  Final      Susceptibility   Enterococcus faecalis - MIC*    AMPICILLIN <=2 SENSITIVE Sensitive     VANCOMYCIN 1 SENSITIVE Sensitive     GENTAMICIN SYNERGY Value in next row Sensitive      SENSITIVEPerformed at [facility] [address], [place], [place] [address]    * ABUNDANT ENTEROCOCCUS FAECALIS   Klebsiella oxytoca - MIC*    AMPICILLIN Value in next row Resistant      SENSITIVEPerformed at [facility] [address] [place], [place] [address]    CEFAZOLIN Value in next row Sensitive      SENSITIVEPerformed at [facility] [address], [place], [place] [address]    CEFEPIME Value in next row Sensitive      SENSITIVEPerformed at [facility] [address] [place], [place] [address]    CEFTAZIDIME Value in next row Sensitive      SENSITIVEPerformed at [facility] [address], [place], [place] [address]    CEFTRIAXONE Value in next  row Sensitive      SENSITIVEPerformed at [facility] [address] [place], [place] [address]    CIPROFLOXACIN Value in next row Sensitive      SENSITIVEPerformed at [facility] [address], [place], [place] [address]    GENTAMICIN Value in next row Sensitive      SENSITIVEPerformed at [facility] [address] [place], [place] [address]    IMIPENEM Value in next row Sensitive      SENSITIVEPerformed at [facility] [address], [place], [place] [address]    TRIMETH/SULFA Value in next row Sensitive      SENSITIVEPerformed at [facility] [address] [place], [place] [address]    AMPICILLIN/SULBACTAM Value in next row Intermediate      SENSITIVEPerformed at [facility] [address] [place], [place] [address]    PIP/TAZO Value in next row Sensitive      SENSITIVEPerformed at [facility] [address], [place], [place] [address]    Extended ESBL Value in next row Sensitive      SENSITIVEPerformed at [facility] [address] [place], [place] [address]    * FEW KLEBSIELLA OXYTOCA     Basic Metabolic Panel:  Recent Labs  09/05/16 0609 09/06/16 0549  NA 137 137  K 4.2 3.7  CL 101 100*  CO2 27 28  GLUCOSE 210* 159*  BUN 27* 21*  CREATININE 1.59* 1.27*  CALCIUM 10.0 10.1   Liver Function Tests: No results for input(s): AST, ALT, ALKPHOS, BILITOT, PROT, ALBUMIN in the last 72 hours.   CBC:  Recent Labs  09/05/16 0644  WBC 28.4*  HGB 10.8*  HCT 34.4*  MCV 87.5  PLT 271    No results found.    Medications:   Impression:  Principal Problem:   Diabetic foot infection ([place]) Active Problems:   Essential hypertension   Nausea and vomiting   Diabetes ([place])   CKD (chronic kidney disease), stage III   Esophageal stricture   Atrial fibrillation with normal ventricular rate ([facility])     Plan: Monitor electrolytes renal function and white count ambulate and mobilize  Consultants: General surgery   Procedures IND of foot   Antibiotics: Multiple           Time spent: 30 minutes   LOS: 5 days   Urvi Imes M   09/06/2016, 11:15 AM

## 2016-09-06 NOTE — Progress Notes (Signed)
Physical Therapy Treatment Patient Details Name: [name] MRN: [id number] DOB: [birth year] Today's Date: 09/06/2016    History of Present Illness [name] is a 83 y.o. male with medical history significant of dCHF, COPD, CKD 3, HTN, CAD, DM2, HLD, BPH who was hospitalized from 8/16 - 23 for a diabetic foot infection; he was discharged on Clindamycin.  Since discharge, he has had esophageal pain, n/v, weakness, and has been unable to eat.  He did not feel good when he left, unable to stand up.  He refused SNF rehab with last discharge; he now recognizes that this might be necessary.  +fever, subjective; + chills.  This started after he stepped on a bolt - he reports that it was NOT better at the time of last discharge.  He has continued to go "downhill:" since he was admitted last time.  He reports that he did take Clinda following last hospitalization.      PT Comments    Pt is feeling bad today, with burning on chest and SOB, uncomfortable and per nsg did not eat well today.  He is tolerating some activity but finally asked PT to let him stop exercises.  Declined OOB but again was over feeling poorly today.  Will reattempt standing tomorrow, as pt is feeling too warm and nsg agreed his room was cool.     Follow Up Recommendations  SNF     Equipment Recommendations  Wheelchair (measurements PT)    Recommendations for Other Services       Precautions / Restrictions Precautions Precautions: Fall Precaution Comments: chronic left foot wound/pain Restrictions Weight Bearing Restrictions: Yes LLE Weight Bearing: Partial weight bearing Other Position/Activity Restrictions: s/p I&D left foot 09/04/16, PWB LEFT foot for pain control    Mobility  Bed Mobility               General bed mobility comments: did not sit up due to symptoms and chest burning  Transfers                 General transfer comment: deferred  Ambulation/Gait                  Stairs            Wheelchair Mobility    Modified Rankin (Stroke Patients Only)       Balance                                            Cognition Arousal/Alertness: Awake/alert Behavior During Therapy: WFL for tasks assessed/performed Overall Cognitive Status: Within Functional Limits for tasks assessed                                        Exercises General Exercises - Lower Extremity Ankle Circles/Pumps: AROM;AAROM;Both;10 reps Quad Sets: AAROM;AROM;Both;10 reps Gluteal Sets: AROM;AAROM;Both;10 reps Heel Slides: AAROM;AROM;Right;10 reps Hip ABduction/ADduction: AROM;AAROM;Both;10 reps    General Comments        Pertinent Vitals/Pain Pain Assessment: Faces Faces Pain Scale: Hurts even more Pain Location: left foot Pain Descriptors / Indicators: Operative site guarding Pain Intervention(s): Monitored during session;Premedicated before session;Repositioned    Home Living  Prior Function            PT Goals (current goals can now be found in the care plan section) Progress towards PT goals: Not progressing toward goals - comment (feeling sick today)    Frequency    Min 3X/week      PT Plan Current plan remains appropriate    Co-evaluation              AM-PAC PT "6 Clicks" Daily Activity  Outcome Measure  Difficulty turning over in bed (including adjusting bedclothes, sheets and blankets)?: Unable Difficulty moving from lying on back to sitting on the side of the bed? : Unable Difficulty sitting down on and standing up from a chair with arms (e.g., wheelchair, bedside commode, etc,.)?: Unable Help needed moving to and from a bed to chair (including a wheelchair)?: A Lot Help needed walking in hospital room?: Total Help needed climbing 3-5 steps with a railing? : Total 6 Click Score: 7    End of Session Equipment Utilized During Treatment: Oxygen Activity Tolerance:  Patient limited by fatigue Patient left: in bed;with call bell/phone within reach;with bed alarm set;with nursing/sitter in room Nurse Communication: Mobility status PT Visit Diagnosis: Unsteadiness on feet (R26.81);Other abnormalities of gait and mobility (R26.89);Muscle weakness (generalized) (M62.81) Pain - Right/Left: Left Pain - part of body: Ankle and joints of foot     Time: 8889-1694 PT Time Calculation (min) (ACUTE ONLY): 19 min  Charges:  $Therapeutic Exercise: 8-22 mins                    G Codes:  Functional Assessment Tool Used: AM-PAC 6 Clicks Basic Mobility     Ramond Dial 09/06/2016, 8:02 PM   8:04 PM, 09/06/16 Mee Hives, PT, MS Physical Therapist - [place] [phone] (Office)

## 2016-09-07 LAB — GLUCOSE, CAPILLARY
GLUCOSE-CAPILLARY: 216 mg/dL — AB (ref 65–99)
Glucose-Capillary: 122 mg/dL — ABNORMAL HIGH (ref 65–99)
Glucose-Capillary: 157 mg/dL — ABNORMAL HIGH (ref 65–99)
Glucose-Capillary: 140 mg/dL — ABNORMAL HIGH (ref 65–99)

## 2016-09-07 LAB — BASIC METABOLIC PANEL
ANION GAP: 8 (ref 5–15)
BUN: 15 mg/dL (ref 6–20)
CHLORIDE: 98 mmol/L — AB (ref 101–111)
CO2: 31 mmol/L (ref 22–32)
Calcium: 10 mg/dL (ref 8.9–10.3)
Creatinine, Ser: 1.16 mg/dL (ref 0.61–1.24)
GFR calc non Af Amer: 56 mL/min — ABNORMAL LOW (ref >60)
Glucose, Bld: 137 mg/dL — ABNORMAL HIGH (ref 65–99)
Potassium: 3.5 mmol/L (ref 3.5–5.1)
Sodium: 137 mmol/L (ref 135–145)

## 2016-09-07 LAB — CBC
HCT: 32.9 % — ABNORMAL LOW (ref 39.0–52.0)
HEMOGLOBIN: 10.4 g/dL — AB (ref 13.0–17.0)
MCH: 27.3 pg (ref 26.0–34.0)
MCHC: 31.6 g/dL (ref 30.0–36.0)
MCV: 86.4 fL (ref 78.0–100.0)
Platelets: 324 10*3/uL (ref 150–400)
RBC: 3.81 MIL/uL — AB (ref 4.22–5.81)
RDW: 14.1 % (ref 11.5–15.5)
WBC: 16.2 10*3/uL — ABNORMAL HIGH (ref 4.0–10.5)

## 2016-09-07 LAB — VANCOMYCIN, TROUGH: Vancomycin Tr: 17 ug/mL (ref 15–20)

## 2016-09-07 MED ORDER — MUPIROCIN CALCIUM 2 % EX CREA
TOPICAL_CREAM | Freq: Two times a day (BID) | CUTANEOUS | Status: DC
  Administered 2016-09-07 – 2016-09-08 (×3): via TOPICAL
  Filled 2016-09-07: qty 15

## 2016-09-07 NOTE — Progress Notes (Signed)
Physical Therapy Treatment Patient Details Name: PHINNEAS SHAKOOR MRN: [id number] DOB: 1931-07-24 Today's Date: 09/07/2016    History of Present Illness FACUNDO ALLEMAND is a 83 y.o. male with medical history significant of dCHF, COPD, CKD 3, HTN, CAD, DM2, HLD, BPH who was hospitalized from 8/16 - 23 for a diabetic foot infection; he was discharged on Clindamycin.  Since discharge, he has had esophageal pain, n/v, weakness, and has been unable to eat.  He did not feel good when he left, unable to stand up.  He refused SNF rehab with last discharge; he now recognizes that this might be necessary.  +fever, subjective; + chills.  This started after he stepped on a bolt - he reports that it was NOT better at the time of last discharge.  He has continued to go "downhill:" since he was admitted last time.  He reports that he did take Clinda following last hospitalization.      PT Comments    Pt agreeable to therapy this session and minimal reports of discomfort in Lt foot.  Transferred supine to sit with encouragement not to use railings and therapist assist.  Pt able to take 8 steps to chair with cues for walker placement, using UE's and postural cues.  Pt sat in recliner with family present.    Follow Up Recommendations        Equipment Recommendations       Recommendations for Other Services       Precautions / Restrictions Precautions Precautions: Fall Restrictions Weight Bearing Restrictions: Yes LLE Weight Bearing: Partial weight bearing Other Position/Activity Restrictions: s/p I&D left foot 09/04/16, PWB LEFT foot for pain control    Mobility  Bed Mobility Overal bed mobility: Needs Assistance Bed Mobility: Supine to Sit Rolling: Supervision   Supine to sit: Min guard     General bed mobility comments: encouragement to complete independently and not use assist  Transfers Overall transfer level: Needs assistance Equipment used: Rolling walker (2 wheeled) Transfers: Sit  to/from Stand Sit to Stand: Mod assist            Ambulation/Gait Ambulation/Gait assistance: Min assist Ambulation Distance (Feet): 8 Feet Assistive device: Rolling walker (2 wheeled) Gait Pattern/deviations: Decreased step length - left;Decreased stance time - left         Stairs            Wheelchair Mobility    Modified Rankin (Stroke Patients Only)       Balance                                            Cognition Arousal/Alertness: Awake/alert Behavior During Therapy: WFL for tasks assessed/performed Overall Cognitive Status: Within Functional Limits for tasks assessed                                        Exercises      General Comments        Pertinent Vitals/Pain      Home Living                      Prior Function            PT Goals (current goals can now be found in the care plan section) Progress towards  PT goals: Progressing toward goals    Frequency           PT Plan Current plan remains appropriatecontinue current plan    Co-evaluation              AM-PAC PT "6 Clicks" Daily Activity  Outcome Measure                   End of Session Equipment Utilized During Treatment: Gait belt;Oxygen Activity Tolerance: Patient limited by fatigue Patient left: in chair;with chair alarm set;with family/visitor present Nurse Communication: Mobility status       Time: 1000-1035 PT Time Calculation (min) (ACUTE ONLY): 35 min  Charges:  $Gait Training: 8-22 mins $Therapeutic Activity: 8-22 mins                    G Codes:      Teena Irani, PTA/CL[place], Akayla Brass B 09/07/2016, 11:36 AM

## 2016-09-07 NOTE — Progress Notes (Signed)
Soak left foot in 1/2 saline and 1/2 betadine for 15 minutes.  Then, apply Bactroban cream to site and then apply dry sterile dressing and ace wrap.

## 2016-09-07 NOTE — Progress Notes (Signed)
Late entry:  Patient's visitor called RN to room.  Patient c/o "pain in my throat and down into my chest."  Patient c/o severe pain.  VS obtained.  Dr. Sarajane Jews notified and came to room. Morphine IV given.  Stat EKG ordered - RT notified.  EKG results paged to night shift MD.  Oncoming RN notified.

## 2016-09-07 NOTE — Progress Notes (Signed)
Pharmacy Antibiotic Note  [name] is a 83 y.o. male admitted on 09/01/2016 with diabetic foot ulcer.  Pharmacy consulted for vancomycin and zosyn dosing.  Vancomycin trough 17 today, at goal  Plan: Continue vancomycin to 1 gm IV q24 hours  Continue zosyn 3.375 gm IV q8 hours F/u restart eliquis  F/u renal function, cultures and clinical course  Height: 5' 10.5" (179.1 cm) Weight: 206 lb 4.8 oz (93.6 kg) IBW/kg (Calculated) : 74.15  Temp (24hrs), Avg:98.2 F (36.8 C), Min:98 F (36.7 C), Max:98.5 F (36.9 C)   Recent Labs Lab 09/01/16 1100 09/01/16 1102 09/01/16 1408 09/02/16 0435 09/03/16 3704 09/03/16 2237 09/04/16 0606 09/05/16 0609 09/05/16 0644 09/06/16 0549 09/07/16 0604 09/07/16 1434  WBC 28.7*  --   --  24.4*  --   --   --   --  28.4*  --  16.2*  --   CREATININE 1.49*  --   --  1.34* 1.47*  --  1.52* 1.59*  --  1.27* 1.16  --   LATICACIDVEN  --  1.4 1.1  --   --   --   --   --   --   --   --   --   VANCOTROUGH  --   --   --   --   --  23*  --   --   --   --   --  17    Estimated Creatinine Clearance: 56 mL/min (by C-G formula based on SCr of 1.16 mg/dL).    No Known Allergies   Thank you for allowing pharmacy to be a part of this patient's care.  Chriss Czar 09/07/2016 3:34 PM

## 2016-09-07 NOTE — Progress Notes (Signed)
3 Days Post-Op  Subjective: Complains of no pain.  Objective: Vital signs in last 24 hours: Temp:  [98 F (36.7 C)-98.6 F (37 C)] 98 F (36.7 C) (09/03 0553) Pulse Rate:  [80-104] 80 (09/03 0553) Resp:  [18] 18 (09/02 2131) BP: (142-157)/(79-93) 157/79 (09/03 0553) SpO2:  [100 %] 100 % (09/03 0553) Last BM Date: 09/06/16  Intake/Output from previous day: 09/02 0701 - 09/03 0700 In: 600 [P.O.:600] Out: 1300 [Urine:1300] Intake/Output this shift: No intake/output data recorded.  General appearance: alert, cooperative and no distress Extremities: Left foot dressing dry and intact. Toes nonischemic.  Lab Results:   Recent Labs  09/05/16 0644 09/07/16 0604  WBC 28.4* 16.2*  HGB 10.8* 10.4*  HCT 34.4* 32.9*  PLT 271 324   BMET  Recent Labs  09/06/16 0549 09/07/16 0604  NA 137 137  K 3.7 3.5  CL 100* 98*  CO2 28 31  GLUCOSE 159* 137*  BUN 21* 15  CREATININE 1.27* 1.16  CALCIUM 10.1 10.0   PT/INR No results for input(s): LABPROT, INR in the last 72 hours.  Studies/Results: No results found.  Anti-infectives: Anti-infectives    Start     Dose/Rate Route Frequency Ordered Stop   09/04/16 1600  vancomycin (VANCOCIN) IVPB 1000 mg/200 mL premix     1,000 mg 200 mL/hr over 60 Minutes Intravenous Every 24 hours 09/04/16 1205     09/01/16 2330  piperacillin-tazobactam (ZOSYN) IVPB 3.375 g     3.375 g 12.5 mL/hr over 240 Minutes Intravenous Every 8 hours 09/01/16 2317     09/01/16 2300  vancomycin (VANCOCIN) 1,500 mg in sodium chloride 0.9 % 500 mL IVPB  Status:  Discontinued     1,500 mg 250 mL/hr over 120 Minutes Intravenous Every 24 hours 09/01/16 1848 09/04/16 1205   09/01/16 2300  clindamycin (CLEOCIN) IVPB 900 mg     900 mg 100 mL/hr over 30 Minutes Intravenous Every 8 hours 09/01/16 2259     09/01/16 1900  ceFEPIme (MAXIPIME) 2 g in dextrose 5 % 50 mL IVPB  Status:  Discontinued     2 g 100 mL/hr over 30 Minutes Intravenous Every 24 hours 09/01/16  1848 09/01/16 2259   09/01/16 1845  metroNIDAZOLE (FLAGYL) IVPB 500 mg  Status:  Discontinued     500 mg 100 mL/hr over 60 Minutes Intravenous Every 8 hours 09/01/16 1822 09/01/16 2258   09/01/16 1200  vancomycin (VANCOCIN) IVPB 1000 mg/200 mL premix     1,000 mg 200 mL/hr over 60 Minutes Intravenous  Once 09/01/16 1149 09/01/16 1317      Assessment/Plan: s/p Procedure(s): INCISION AND DRAINAGE ABSCESS Impression: Progressing slowly but well. May weight-bear on heel of left foot. Continue current dressing orders. Leukocytosis resolving.  LOS: 6 days    Aviva Signs 09/07/2016

## 2016-09-07 NOTE — Progress Notes (Signed)
Patient alert and oriented postop day #3 currently on multiple antibiotics intravenously and orally patient had difficulty swallowing status post dilatation of LEJ . however he ate chicken dumplings rather well the other day after which his diet to soft diet [name] LOV:[id number] DOB: 11-May-1931 DOA: 09/01/2016 PCP: [name], MD   Physical Exam: Blood pressure (!) 157/79, pulse 80, temperature 98 F (36.7 C), temperature source Oral, resp. rate 18, height 5' 10.5" (1.791 m), weight 93.6 kg (206 lb 4.8 oz), SpO2 100 %. Lungs diminished breath sounds in bases no rales wheeze rhonchi appreciable heart rhythm no S3 or S4 no heaves or rubs abdomen soft nontender bowel sounds normoactive   Investigations:  Recent Results (from the past 240 hour(s))  Blood culture (routine x 2)     Status: None   Collection Time: 09/01/16 11:00 AM  Result Value Ref Range Status   Specimen Description RIGHT ANTECUBITAL  Final   Special Requests   Final    BOTTLES DRAWN AEROBIC AND ANAEROBIC Blood Culture adequate volume   Culture NO GROWTH 5 DAYS  Final   Report Status 09/06/2016 FINAL  Final  Blood culture (routine x 2)     Status: None   Collection Time: 09/01/16 11:08 AM  Result Value Ref Range Status   Specimen Description BLOOD RIGHT WRIST  Final   Special Requests   Final    BOTTLES DRAWN AEROBIC AND ANAEROBIC Blood Culture adequate volume   Culture NO GROWTH 5 DAYS  Final   Report Status 09/06/2016 FINAL  Final  Surgical pcr screen     Status: None   Collection Time: 09/03/16 11:33 AM  Result Value Ref Range Status   MRSA, PCR NEGATIVE NEGATIVE Final   Staphylococcus aureus NEGATIVE NEGATIVE Final    Comment: (NOTE) The Xpert SA Assay (FDA approved for NASAL specimens in patients 25 years of age and older), is one component of a comprehensive surveillance program. It is not intended to diagnose infection nor to guide or monitor treatment.   Aerobic/Anaerobic Culture (surgical/deep  wound)     Status: None (Preliminary result)   Collection Time: 09/04/16 10:16 AM  Result Value Ref Range Status   Specimen Description FOOT LEFT  Final   Special Requests NONE  Final   Gram Stain   Final    NO WBC SEEN RARE SQUAMOUS EPITHELIAL CELLS PRESENT FEW GRAM POSITIVE COCCI IN PAIRS RARE GRAM NEGATIVE RODS    Culture   Final    ABUNDANT ENTEROCOCCUS FAECALIS FEW KLEBSIELLA OXYTOCA HOLDING FOR POSSIBLE ANAEROBE Performed at [facility], [place] [address] [place], [place] [address]    Report Status PENDING  Incomplete   Organism ID, Bacteria ENTEROCOCCUS FAECALIS  Final   Organism ID, Bacteria KLEBSIELLA OXYTOCA  Final      Susceptibility   Enterococcus faecalis - MIC*    AMPICILLIN <=2 SENSITIVE Sensitive     VANCOMYCIN 1 SENSITIVE Sensitive     GENTAMICIN SYNERGY SENSITIVE Sensitive     * ABUNDANT ENTEROCOCCUS FAECALIS   Klebsiella oxytoca - MIC*    AMPICILLIN >=32 RESISTANT Resistant     CEFAZOLIN 8 SENSITIVE Sensitive     CEFEPIME <=1 SENSITIVE Sensitive     CEFTAZIDIME <=1 SENSITIVE Sensitive     CEFTRIAXONE <=1 SENSITIVE Sensitive     CIPROFLOXACIN <=0.25 SENSITIVE Sensitive     GENTAMICIN <=1 SENSITIVE Sensitive     IMIPENEM <=0.25 SENSITIVE Sensitive     TRIMETH/SULFA <=20 SENSITIVE Sensitive  AMPICILLIN/SULBACTAM 16 INTERMEDIATE Intermediate     PIP/TAZO <=4 SENSITIVE Sensitive     Extended ESBL NEGATIVE Sensitive     * FEW KLEBSIELLA OXYTOCA     Basic Metabolic Panel:  Recent Labs  09/05/16 0609 09/06/16 0549  NA 137 137  K 4.2 3.7  CL 101 100*  CO2 27 28  GLUCOSE 210* 159*  BUN 27* 21*  CREATININE 1.59* 1.27*  CALCIUM 10.0 10.1   Liver Function Tests: No results for input(s): AST, ALT, ALKPHOS, BILITOT, PROT, ALBUMIN in the last 72 hours.   CBC:  Recent Labs  09/05/16 0644 09/07/16 0604  WBC 28.4* 16.2*  HGB 10.8* 10.4*  HCT 34.4* 32.9*  MCV 87.5 86.4  PLT 271 324    No results found.    Medications:    Impression:  Principal Problem:   Diabetic foot infection ([place]) Active Problems:   Essential hypertension   Nausea and vomiting   Diabetes ([place])   CKD (chronic kidney disease), stage III   Esophageal stricture   Atrial fibrillation with normal ventricular rate (HCC)     Plan: Ambulate patient strengthening with physical therapy continue antibiotics as per surgery  Consultants: General surgery   Procedures   Antibiotics: Multiple           Time spent: 30 minutes   LOS: 6 days   Ved Martos M   09/07/2016, 8:00 AM

## 2016-09-08 LAB — GLUCOSE, CAPILLARY
GLUCOSE-CAPILLARY: 214 mg/dL — AB (ref 65–99)
GLUCOSE-CAPILLARY: 79 mg/dL (ref 65–99)
GLUCOSE-CAPILLARY: 231 mg/dL — AB (ref 65–99)
Glucose-Capillary: 155 mg/dL — ABNORMAL HIGH (ref 65–99)
Glucose-Capillary: 205 mg/dL — ABNORMAL HIGH (ref 65–99)

## 2016-09-08 MED ORDER — VANCOMYCIN IV (FOR PTA / DISCHARGE USE ONLY): 1000.0000 mg | INTRAVENOUS | 0 refills | Status: AC

## 2016-09-08 MED ORDER — PIPERACILLIN-TAZOBACTAM IV (FOR PTA / DISCHARGE USE ONLY): 3.3750 g | Freq: Three times a day (TID) | INTRAVENOUS | 0 refills | Status: AC

## 2016-09-08 MED ORDER — LORAZEPAM 1 MG PO TABS: 1.0000 mg | ORAL_TABLET | Freq: Every day | ORAL | 0 refills | Status: DC

## 2016-09-08 MED ORDER — MUPIROCIN CALCIUM 2 % EX CREA: TOPICAL_CREAM | Freq: Two times a day (BID) | CUTANEOUS | 0 refills | Status: DC

## 2016-09-08 MED ORDER — INSULIN GLARGINE 100 UNIT/ML ~~LOC~~ SOLN: 30.0000 [IU] | Freq: Every day | SUBCUTANEOUS | 11 refills | Status: DC

## 2016-09-08 MED ORDER — FLUOXETINE HCL 20 MG/5ML PO SOLN: 20.0000 mg | Freq: Every day | ORAL | 3 refills | Status: DC

## 2016-09-08 MED ORDER — POVIDONE-IODINE 10 % EX SOLN: CUTANEOUS | 0 refills | Status: DC | PRN

## 2016-09-08 MED ORDER — PIPERACILLIN-TAZOBACTAM 3.375 G IVPB: 3.3750 g | Freq: Three times a day (TID) | INTRAVENOUS | 0 refills | Status: DC

## 2016-09-08 NOTE — Clinical Social Work Placement (Signed)
   CLINICAL SOCIAL WORK PLACEMENT  NOTE  Date:  09/08/2016  Patient Details  Name: [name] MRN: [id number] Date of Birth: 11/18/1931  Clinical Social Work is seeking post-discharge placement for this patient at the [employer] level of care (*CSW will initial, date and re-position this form in  chart as items are completed):  Yes   Patient/family provided with [facility] Work Department's list of facilities offering this level of care within the geographic area requested by the patient (or if unable, by the patient's family).  Yes   Patient/family informed of their freedom to choose among providers that offer the needed level of care, that participate in Medicare, Medicaid or managed care program needed by the patient, have an available bed and are willing to accept the patient.  Yes   Patient/family informed of [facility]'s ownership interest in [facility] and [facility], as well as of the fact that they are under no obligation to receive care at these facilities.  PASRR submitted to EDS on 09/04/16     PASRR number received on 09/04/16     Existing PASRR number confirmed on       FL2 transmitted to all facilities in geographic area requested by pt/family on 09/04/16     FL2 transmitted to all facilities within larger geographic area on       Patient informed that his/her managed care company has contracts with or will negotiate with certain facilities, including the following:        Yes   Patient/family informed of bed offers received.  Patient chooses bed at [facility]     Physician recommends and patient chooses bed at      Patient to be transferred to [facility] on 09/08/16.  Patient to be transferred to facility by [facility] staff      Patient family notified on 09/08/16 of transfer.  Name of family member notified:  Message left for  daughter Velva Harman.      PHYSICIAN       Additional Comment: Message left  for Tami at [facility]. Clinicals sent via [employer]. LCSW signing off.    _______________________________________________ Ambrose Pancoast D, LCSW 09/08/2016, 1:50 PM

## 2016-09-08 NOTE — Discharge Summary (Signed)
Physician Discharge Summary  [name] GGY:[id number] DOB: May 13, 1931 DOA: 09/01/2016  PCP: [name], MD  Admit date: 09/01/2016 Discharge date: 09/08/2016   Recommendations for Outpatient Follow-up:  Patient is advised to enter skilled nursing facility care for rehabilitation strengthening and ambulation also to continue twice a day Betadine soaks of her left foot as well as continue 7 additional days of IV vancomycin and Zosyn as ordered he is given 2 prescriptions and written for oxycodone 15 mg by mouth 3 times a day when necessary as well as lorazepam 1 mg by mouth at bedtime when necessary for sleep the patient will need his renal function monitored due to vancomycin as well as his glucoses monitored serially throughout the day for hyperglycemia and pulses blood pressure for uncontrolled hypertension he has a history of esophageal stricture which was dilated 3 months ago and seems to have some difficulty with gaining and choking but seems to swallow other semisolid food quite well. Therefore the patient is given a soft diet while at skilled nursing facility which she seemed to tolerate the last 2 days in hospital Discharge Diagnoses:  Principal Problem:   Diabetic foot infection ([place]) Active Problems:   Essential hypertension   Nausea and vomiting   Diabetes ([place])   CKD (chronic kidney disease), stage III   Esophageal stricture   Atrial fibrillation with normal ventricular rate [facility])   Discharge Condition: Good  Filed Weights   09/01/16 1040 09/01/16 1818 09/02/16 0453  Weight: 98 kg (216 lb) 92.7 kg (204 lb 5.9 oz) 93.6 kg (206 lb 4.8 oz)    History of present illness:  Patient is an 83 year old white male with history of CABG 3 history of hypertension hyperlipidemia acute on chronic renal insufficiency now back to baseline who stepped on a nail and had left foot cellulitis in the face of diabetic foot he was given intravenous antibiotics for a period of 10-12  days socially was discharged home under home health care and physical therapy he did not thrive well at home and did 90 well became weak and was seen back in hospital placed on vancomycin and Zosyn as clindamycin was continued and Flagyl was added to his antibiotic regimen he then had an incision and drainage by general surgery Dr. Arnoldo Morale of his left foot and with twice a day Betadine soaks was felt he was not strong enough or steady enough to go home and patient was scheduled for rehabilitation stay to skilled nursing facility the [facility] he will need to have his renal function monitored as well as his glycemic control monitored and hemodynamics throughout his hospital stay he is urged to have a soft diet mechanical to avoid any swallowing issues  Hospital Course:  See history of present illness above  Procedures:  Incision and drainage of left foot   Consultations:  General surgery  Discharge Instructions  Discharge Instructions    Discharge instructions    Complete by:  As directed    Discharge patient    Complete by:  As directed    Discharge disposition:  03-Skilled [place]   Discharge patient date:  09/08/2016   Home infusion instructions [facility] May follow [place] Dosing Protocol; May administer Cathflo as needed to maintain patency of vascular access device.; Flushing of vascular access device: per [facility] Protocol: 0.9% NaCl pre/post medica...    Complete by:  As directed    Instructions:  May follow [place] Dosing Protocol   Instructions:  May administer Cathflo as needed to maintain patency of vascular access device.   Instructions:  Flushing of vascular access device: per [facility] Protocol: 0.9% NaCl pre/post medication administration and prn patency; Heparin 100 u/ml, 64ml for implanted ports and Heparin 10u/ml, 84ml for all other central venous catheters.   Instructions:  May follow [facility] Anaphylaxis Protocol for First Dose Administration in the home: 0.9%  NaCl at 25-50 ml/hr to maintain IV access for protocol meds. Epinephrine 0.3 ml IV/IM PRN and Benadryl 25-50 IV/IM PRN s/s of anaphylaxis.   Instructions:  [facility] Coordinator (RN) to assist per patient IV care needs in the home PRN.      No Known Allergies Contact information for after-discharge care    [facility] .   Specialty:  Skilled Nursing Facility Contact information: [address] [place] [phone]               The results of significant diagnostics from this hospitalization (including imaging, microbiology, ancillary and laboratory) are listed below for reference.    Significant Diagnostic Studies: Dg Chest 2 View  Result Date: 09/01/2016 CLINICAL DATA:  Generalized abdominal and chest pain. EXAM: CHEST  2 VIEW COMPARISON:  08/27/2016. FINDINGS: Stable cardiomegaly with aortic atherosclerosis. Status post CABG. Mild interstitial edema with atelectasis at the lung bases. No pneumonic consolidation, effusion or pneumothorax. No acute osseous abnormality. IMPRESSION: 1. Stable cardiomegaly with aortic atherosclerosis and post CABG change. 2. Bibasilar atelectasis with minimal interstitial edema. Electronically Signed   By: Ashley Royalty M.D.   On: 09/01/2016 14:28   Dg Chest 2 View  Result Date: 08/27/2016 CLINICAL DATA:  Shortness of breath, hypertension, history of coronary artery disease and previous COPD. Remote history of smoking. EXAM: CHEST  2 VIEW COMPARISON:  PA and lateral chest x-ray of August 20, 2016 FINDINGS: Today's study is obtained in a lordotic fashion. The lungs are reasonably well inflated. The interstitial markings remain increased. The cardiac silhouette remains enlarged. The pulmonary vascularity is slightly less engorged. There is calcification in the wall of the aortic arch. The sternal wires are intact. A small amount of pleural fluid is likely present on the right.  IMPRESSION: CHF with mild interstitial edema stable to slightly improved since the previous study. Previous CABG.  Thoracic aortic atherosclerosis. Electronically Signed   By: David  Martinique M.D.   On: 08/27/2016 11:31   Dg Chest 2 View  Result Date: 08/20/2016 CLINICAL DATA:  Chills. EXAM: CHEST  2 VIEW COMPARISON:  07/24/2016 FINDINGS: Prior CABG. Cardiomegaly with vascular congestion and diffuse interstitial/alveolar opacities, likely edema/ CHF. No effusions or acute bony abnormality. IMPRESSION: Mild CHF. Electronically Signed   By: Rolm Baptise M.D.   On: 08/20/2016 10:57   US Renal  Result Date: 08/24/2016 CLINICAL DATA:  Acute on chronic renal failure. History of BPH, hypertension, diabetes, coronary artery disease. EXAM: RENAL / URINARY TRACT ULTRASOUND COMPLETE COMPARISON:  CT scan of the chest and abdomen of June 10, 2016 FINDINGS: Right Kidney: Length: 13.8 cm. The renal cortical echotexture is approximately equal to that of the liver. There is no hydronephrosis nor cystic or solid mass. Left Kidney: Length: 12.8 cm. The renal cortical echotexture is similar to that on the right. There is no cystic or solid mass nor hydronephrosis. I cannot exclude a small amount of perinephric fluid on the left. Bladder: Appears normal for degree of bladder distention. IMPRESSION: Increased renal cortical echotexture consistent with medical  renal disease. There is no hydronephrosis. There may be a small amount of perinephric fluid on the left. Normal appearance of the partially distended urinary bladder. Electronically Signed   By: David  Martinique M.D.   On: 08/24/2016 09:48   Mr Foot Left Wo Contrast  Result Date: 08/20/2016 CLINICAL DATA:  Diabetic patient suffered a puncture wound on the plantar surface of the left foot 1 day ago. Fever and chills. EXAM: MRI OF THE LEFT FOOT WITHOUT CONTRAST TECHNIQUE: Multiplanar, multisequence MR imaging of the left foot was performed. No intravenous contrast was  administered. COMPARISON:  Plain films left foot earlier today. FINDINGS: Bones/Joint/Cartilage There is no bone marrow signal abnormality to suggest osteomyelitis. No joint effusion is seen. There is no fracture or stress change. Ligaments Intact. Muscles and Tendons Intact. Soft tissues No fluid collection. Small focus of susceptibility artifact is seen on the plantar surface of the foot in the cutaneous tissues just lateral to the third MTP joint. IMPRESSION: Negative for abscess, osteomyelitis or septic joint. Small focus of susceptibility artifact in the cutaneous tissues on the plantar surface of the foot just medial to the third MTP joint is consistent with the presence of a metallic foreign body which could be microscopic. Note is made that no foreign body is seen on the prior plain films. Electronically Signed   By: Inge Rise M.D.   On: 08/20/2016 15:58   US Arterial Seg Single  Result Date: 09/02/2016 CLINICAL DATA:  83 year old male with left foot puncture wound and cellulitis. EXAM: NONINVASIVE PHYSIOLOGIC VASCULAR STUDY OF BILATERAL LOWER EXTREMITIES TECHNIQUE: Evaluation of both lower extremities was performed at rest, including calculation of ankle-brachial indices, multiple segmental pressure evaluation, segmental Doppler and segmental pulse volume recording. COMPARISON:  None. FINDINGS: Right ABI:  1.0 Left ABI:  1.2 Right Lower Extremity: Normal posterior tibial arterial waveform. The dorsalis pedis waveform is slightly abnormal suggesting an element of anterior tibial or small vessel disease. Left Lower Extremity: Normal posterior tibial waveform. The dorsalis pedis waveform is slightly abnormal suggesting anterior tibial or small vessel disease. IMPRESSION: Normal bilateral resting ankle-brachial indices. Signed, Criselda Peaches, MD [facility] Electronically Signed   By: Jacqulynn Cadet M.D.   On: 09/02/2016 11:10   Dg  Foot Complete Left  Result Date: 09/01/2016 CLINICAL DATA:  Left foot redness. EXAM: LEFT FOOT - COMPLETE 3+ VIEW COMPARISON:  08/20/2016 FINDINGS: There is no evidence of fracture or dislocation. There is no periosteal reaction or bone destruction. There soft tissue emphysema between the third and fourth metatarsals concerning for infection. IMPRESSION: 1. No evidence of osteomyelitis of the left foot. 2. Soft tissue emphysema between the third and fourth metatarsals concerning for necrotizing infection. Electronically Signed   By: Kathreen Devoid   On: 09/01/2016 14:29   Dg Foot Complete Left  Result Date: 08/20/2016 CLINICAL DATA:  Removal of foreign body.  Chills and pain. EXAM: LEFT FOOT - COMPLETE 3+ VIEW COMPARISON:  No recent . FINDINGS: Mild diffuse soft tissue swelling. Peripheral vascular calcification. No radiopaque foreign body. Diffuse degenerative change. No acute bony abnormality identified. No evidence of fracture or dislocation. If osteomyelitis is of concern MRI can be obtained IMPRESSION: 1. Mild diffuse soft tissue swelling. Peripheral vascular disease. No radiopaque foreign body. 2. No acute or focal bony abnormality identified. Diffuse degenerative disease. Electronically Signed   By: Marcello Moores  Register   On: 08/20/2016 10:12    Microbiology: Recent Results (from the past 240 hour(s))  Blood culture (routine x 2)     Status: None   Collection Time: 09/01/16 11:00 AM  Result Value Ref Range Status   Specimen Description RIGHT ANTECUBITAL  Final   Special Requests   Final    BOTTLES DRAWN AEROBIC AND ANAEROBIC Blood Culture adequate volume   Culture NO GROWTH 5 DAYS  Final   Report Status 09/06/2016 FINAL  Final  Blood culture (routine x 2)     Status: None   Collection Time: 09/01/16 11:08 AM  Result Value Ref Range Status   Specimen Description BLOOD RIGHT WRIST  Final   Special Requests   Final    BOTTLES DRAWN AEROBIC AND ANAEROBIC Blood Culture adequate volume   Culture  NO GROWTH 5 DAYS  Final   Report Status 09/06/2016 FINAL  Final  Surgical pcr screen     Status: None   Collection Time: 09/03/16 11:33 AM  Result Value Ref Range Status   MRSA, PCR NEGATIVE NEGATIVE Final   Staphylococcus aureus NEGATIVE NEGATIVE Final    Comment: (NOTE) The Xpert SA Assay (FDA approved for NASAL specimens in patients 50 years of age and older), is one component of a comprehensive surveillance program. It is not intended to diagnose infection nor to guide or monitor treatment.   Aerobic/Anaerobic Culture (surgical/deep wound)     Status: None (Preliminary result)   Collection Time: 09/04/16 10:16 AM  Result Value Ref Range Status   Specimen Description FOOT LEFT  Final   Special Requests NONE  Final   Gram Stain   Final    NO WBC SEEN RARE SQUAMOUS EPITHELIAL CELLS PRESENT FEW GRAM POSITIVE COCCI IN PAIRS RARE GRAM NEGATIVE RODS Performed at [facility], [place] [address] [place], [place] [address]    Culture   Final    ABUNDANT ENTEROCOCCUS FAECALIS FEW KLEBSIELLA OXYTOCA NO ANAEROBES ISOLATED; CULTURE IN PROGRESS FOR 5 DAYS    Report Status PENDING  Incomplete   Organism ID, Bacteria ENTEROCOCCUS FAECALIS  Final   Organism ID, Bacteria KLEBSIELLA OXYTOCA  Final      Susceptibility   Enterococcus faecalis - MIC*    AMPICILLIN <=2 SENSITIVE Sensitive     VANCOMYCIN 1 SENSITIVE Sensitive     GENTAMICIN SYNERGY SENSITIVE Sensitive     * ABUNDANT ENTEROCOCCUS FAECALIS   Klebsiella oxytoca - MIC*    AMPICILLIN >=32 RESISTANT Resistant     CEFAZOLIN 8 SENSITIVE Sensitive     CEFEPIME <=1 SENSITIVE Sensitive     CEFTAZIDIME <=1 SENSITIVE Sensitive     CEFTRIAXONE <=1 SENSITIVE Sensitive     CIPROFLOXACIN <=0.25 SENSITIVE Sensitive     GENTAMICIN <=1 SENSITIVE Sensitive     IMIPENEM <=0.25 SENSITIVE Sensitive     TRIMETH/SULFA <=20 SENSITIVE Sensitive     AMPICILLIN/SULBACTAM 16 INTERMEDIATE Intermediate     PIP/TAZO <=4 SENSITIVE Sensitive      Extended ESBL NEGATIVE Sensitive     * FEW KLEBSIELLA OXYTOCA     Labs: Basic Metabolic Panel:  Recent Labs Lab 09/03/16 0609 09/04/16 0606 09/05/16 0609 09/06/16 0549 09/07/16 0604  NA 139 137 137 137 137  K 3.8 4.2 4.2 3.7 3.5  CL 101 101 101 100* 98*  CO2 28 29 27 28 31   GLUCOSE 55* 130* 210* 159* 137*  BUN 26* 24* 27* 21* 15  CREATININE 1.47* 1.52* 1.59* 1.27* 1.16  CALCIUM 10.0 10.0 10.0 10.1 10.0   Liver Function Tests: No results for input(s): AST, ALT, ALKPHOS, BILITOT, PROT, ALBUMIN in  the last 168 hours. No results for input(s): LIPASE, AMYLASE in the last 168 hours. No results for input(s): AMMONIA in the last 168 hours. CBC:  Recent Labs Lab 09/02/16 0435 09/05/16 0644 09/07/16 0604  WBC 24.4* 28.4* 16.2*  HGB 10.9* 10.8* 10.4*  HCT 34.2* 34.4* 32.9*  MCV 86.6 87.5 86.4  PLT 299 271 324   Cardiac Enzymes: No results for input(s): CKTOTAL, CKMB, CKMBINDEX, TROPONINI in the last 168 hours. BNP: BNP (last 3 results)  Recent Labs  03/19/16 1222 06/08/16 1133 07/24/16 1006  BNP 156.0* 1,759.0* 251.0*    ProBNP (last 3 results) No results for input(s): PROBNP in the last 8760 hours.  CBG:  Recent Labs Lab 09/07/16 1134 09/07/16 1745 09/07/16 2158 09/08/16 0754 09/08/16 1105  GLUCAP 157* 216* 140* 79 155*       Signed:  Finas Delone M  [facility] Pager: [phone] 09/08/2016, 1:06 PM

## 2016-09-08 NOTE — Progress Notes (Signed)
Physical Therapy Treatment Patient Details Name: [name] MRN: [id number] DOB: May 08, 1931 Today's Date: 09/08/2016    History of Present Illness [name] is a 83 y.o. male with medical history significant of dCHF, COPD, CKD 3, HTN, CAD, DM2, HLD, BPH who was hospitalized from 8/16 - 23 for a diabetic foot infection; he was discharged on Clindamycin.  Since discharge, he has had esophageal pain, n/v, weakness, and has been unable to eat.  He did not feel good when he left, unable to stand up.  He refused SNF rehab with last discharge; he now recognizes that this might be necessary.  +fever, subjective; + chills.  This started after he stepped on a bolt - he reports that it was NOT better at the time of last discharge.  He has continued to go "downhill:" since he was admitted last time.  He reports that he did take Clinda following last hospitalization.      PT Comments    Pt received lying semirecumbent in bed and was agreeable to PT treatment. Pt c/o of L foot pain and chest pain. RN notified of pt's complaints and bed level exercises only were completed. Pt tolerated LE therex well with no c/o increased L foot pain, only requiring min cues for proper technique. Continue to recommend SNF upon d/c once ready for discharge due to deficits in mobility.     Follow Up Recommendations  SNF     Equipment Recommendations  Wheelchair (measurements PT)    Recommendations for Other Services       Precautions / Restrictions Precautions Precautions: Fall Restrictions Weight Bearing Restrictions: Yes LLE Weight Bearing: Partial weight bearing Other Position/Activity Restrictions: s/p I&D left foot 09/04/16, PWB LEFT foot for pain control    Mobility  Bed Mobility               General bed mobility comments: n/a this date as pt c/o chest pain so bed level exercises only completed  Transfers                 General transfer comment: deferred  Ambulation/Gait              General Gait Details: deferred   Stairs            Wheelchair Mobility    Modified Rankin (Stroke Patients Only)       Balance                                            Cognition Arousal/Alertness: Awake/alert Behavior During Therapy: WFL for tasks assessed/performed;Agitated Overall Cognitive Status: Within Functional Limits for tasks assessed                                 General Comments: pt slightly agitated during session and at EOS      Exercises General Exercises - Lower Extremity Ankle Circles/Pumps: Both;20 reps;Supine Short Arc Quad: Both;10 reps;Supine Heel Slides: Both;10 reps;Supine Hip ABduction/ADduction: Both;10 reps;Supine Straight Leg Raises: Both;AAROM;10 reps;Supine    General Comments        Pertinent Vitals/Pain Pain Assessment: 0-10 Pain Score: 8  Pain Location: left foot and chest pain Pain Intervention(s): Limited activity within patient's tolerance;Monitored during session;Repositioned    Home Living  Prior Function            PT Goals (current goals can now be found in the care plan section) Acute Rehab PT Goals Patient Stated Goal: return home after rehab PT Goal Formulation: With patient Time For Goal Achievement: 09/09/16 Potential to Achieve Goals: Good    Frequency    Min 3X/week      PT Plan      Co-evaluation              AM-PAC PT "6 Clicks" Daily Activity  Outcome Measure  Difficulty turning over in bed (including adjusting bedclothes, sheets and blankets)?: Unable Difficulty moving from lying on back to sitting on the side of the bed? : Unable Difficulty sitting down on and standing up from a chair with arms (e.g., wheelchair, bedside commode, etc,.)?: Unable Help needed moving to and from a bed to chair (including a wheelchair)?: Total Help needed walking in hospital room?: Total Help needed climbing 3-5 steps with a  railing? : Total 6 Click Score: 6    End of Session Equipment Utilized During Treatment: Oxygen Activity Tolerance: Patient tolerated treatment well;No increased pain;Treatment limited secondary to medical complications (Comment) Patient left: in bed;with call bell/phone within reach;with bed alarm set Nurse Communication: Mobility status;Other (comment) (RN notified of pt's chest pain) PT Visit Diagnosis: Unsteadiness on feet (R26.81);Other abnormalities of gait and mobility (R26.89);Muscle weakness (generalized) (M62.81) Pain - Right/Left: Left Pain - part of body: Ankle and joints of foot     Time: 9417-4081 PT Time Calculation (min) (ACUTE ONLY): 17 min  Charges:  $Therapeutic Exercise: 8-22 mins                    G Codes:         Geraldine Solar PT, DPT

## 2016-09-08 NOTE — Progress Notes (Signed)
[facility] called to report patient is unable to come at this time will have to wait till tomorrow due to unresolved issues.

## 2016-09-08 NOTE — Progress Notes (Signed)
Patient Medication reconciliation has been completed, patient is ready for discharge. [place] called for report. Reported IV should remain in due to IV antibiotics to be given at [facility].

## 2016-09-08 NOTE — Progress Notes (Signed)
Patients foot soaked in betadine/normal saline mixture for 15 minutes as ordered. Muripirocin ointment applied then dry dressing and foot wrapped loosely with kerlix and ace bandage. No purulent drainage noted. Patient tolerated well.

## 2016-09-08 NOTE — Care Management Note (Signed)
Case Management Note  Patient Details  Name: [name] MRN: [id number] Date of Birth: [birth year]  If discussed at [place] Length of Stay Meetings, dates discussed:  09/08/2016  Additional Comments:  Aneli Zara, Chauncey Reading, RN 09/08/2016, 12:09 PM

## 2016-09-08 NOTE — Progress Notes (Signed)
4 Days Post-Op  Subjective: Mild incisional pain.  Objective: Vital signs in last 24 hours: Temp:  [98 F (36.7 C)-98.2 F (36.8 C)] 98 F (36.7 C) (09/03 2143) Pulse Rate:  [78-82] 82 (09/03 1855) Resp:  [16] 16 (09/03 2143) BP: (168-179)/(72-81) 168/72 (09/03 2143) SpO2:  [100 %] 100 % (09/03 2143) Last BM Date: 09/06/16  Intake/Output from previous day: 09/03 0701 - 09/04 0700 In: 60 [P.O.:60] Out: 1150 [Urine:1150] Intake/Output this shift: No intake/output data recorded.  General appearance: alert, cooperative and no distress Extremities: Wound healing well. No purulent drainage. Slightly less swelling noted in left foot.  Lab Results:   Recent Labs  09/07/16 0604  WBC 16.2*  HGB 10.4*  HCT 32.9*  PLT 324   BMET  Recent Labs  09/06/16 0549 09/07/16 0604  NA 137 137  K 3.7 3.5  CL 100* 98*  CO2 28 31  GLUCOSE 159* 137*  BUN 21* 15  CREATININE 1.27* 1.16  CALCIUM 10.1 10.0   PT/INR No results for input(s): LABPROT, INR in the last 72 hours.  Studies/Results: No results found.  Anti-infectives: Anti-infectives    Start     Dose/Rate Route Frequency Ordered Stop   09/04/16 1600  vancomycin (VANCOCIN) IVPB 1000 mg/200 mL premix     1,000 mg 200 mL/hr over 60 Minutes Intravenous Every 24 hours 09/04/16 1205     09/01/16 2330  piperacillin-tazobactam (ZOSYN) IVPB 3.375 g     3.375 g 12.5 mL/hr over 240 Minutes Intravenous Every 8 hours 09/01/16 2317     09/01/16 2300  vancomycin (VANCOCIN) 1,500 mg in sodium chloride 0.9 % 500 mL IVPB  Status:  Discontinued     1,500 mg 250 mL/hr over 120 Minutes Intravenous Every 24 hours 09/01/16 1848 09/04/16 1205   09/01/16 2300  clindamycin (CLEOCIN) IVPB 900 mg     900 mg 100 mL/hr over 30 Minutes Intravenous Every 8 hours 09/01/16 2259     09/01/16 1900  ceFEPIme (MAXIPIME) 2 g in dextrose 5 % 50 mL IVPB  Status:  Discontinued     2 g 100 mL/hr over 30 Minutes Intravenous Every 24 hours 09/01/16 1848  09/01/16 2259   09/01/16 1845  metroNIDAZOLE (FLAGYL) IVPB 500 mg  Status:  Discontinued     500 mg 100 mL/hr over 60 Minutes Intravenous Every 8 hours 09/01/16 1822 09/01/16 2258   09/01/16 1200  vancomycin (VANCOCIN) IVPB 1000 mg/200 mL premix     1,000 mg 200 mL/hr over 60 Minutes Intravenous  Once 09/01/16 1149 09/01/16 1317      Assessment/Plan: s/p Procedure(s): INCISION AND DRAINAGE ABSCESS Impression: Enterococcus and Klebsiella grew out of his foot cultures. Would adjust antibiotics as indicated. Okay for discharge to skilled nursing unit. They can call me from [facility] should I be needed to follow-up.  LOS: 7 days    Aviva Signs 09/08/2016

## 2016-09-08 NOTE — Progress Notes (Signed)
Discharge instructions given to the receiving nurse at [facility]. Dr. Cindie Laroche advised to give medication list to [facility] that is within the AVS, receiving nurse states this will be fine.

## 2016-09-08 NOTE — Clinical Social Work Note (Signed)
LCSW text paged attended and left a voicemail message on cell requesting medications be placed on discharge summary.

## 2016-09-08 NOTE — Care Management Important Message (Signed)
Important Message  Patient Details  Name: ANTAWN SISON MRN: [id number] Date of Birth: 01/23/1931   Medicare Important Message Given:  Yes    Zakira Ressel, Chauncey Reading, RN 09/08/2016, 7:44 AM

## 2016-09-09 ENCOUNTER — Inpatient Hospital Stay (HOSPITAL_COMMUNITY)
Admission: EM | Admit: 2016-09-09 | Discharge: 2016-09-15 | DRG: 194 | Disposition: A | Discharge status: Skilled Nursing Facility | Payer: Medicare Other | Attending: Family Medicine | Admitting: Family Medicine

## 2016-09-09 ENCOUNTER — Non-Acute Institutional Stay (SKILLED_NURSING_FACILITY): Payer: Medicare Other | Admitting: Internal Medicine

## 2016-09-09 ENCOUNTER — Encounter (HOSPITAL_COMMUNITY): Payer: Self-pay | Admitting: Emergency Medicine

## 2016-09-09 ENCOUNTER — Emergency Department (HOSPITAL_COMMUNITY): Payer: Medicare Other

## 2016-09-09 ENCOUNTER — Inpatient Hospital Stay
Admission: RE | Admit: 2016-09-09 | Discharge: 2016-09-09 | Disposition: A | Discharge status: Other Acute Inpatient Hospital | Payer: Medicare Other | Source: Ambulatory Visit | Attending: Internal Medicine | Admitting: Internal Medicine

## 2016-09-09 DIAGNOSIS — D175 Benign lipomatous neoplasm of intra-abdominal organs: Secondary | ICD-10-CM | POA: Diagnosis present

## 2016-09-09 DIAGNOSIS — R52 Pain, unspecified: Secondary | ICD-10-CM

## 2016-09-09 DIAGNOSIS — Z79899 Other long term (current) drug therapy: Secondary | ICD-10-CM

## 2016-09-09 DIAGNOSIS — Z87891 Personal history of nicotine dependence: Secondary | ICD-10-CM

## 2016-09-09 DIAGNOSIS — Z951 Presence of aortocoronary bypass graft: Secondary | ICD-10-CM

## 2016-09-09 DIAGNOSIS — L03032 Cellulitis of left toe: Secondary | ICD-10-CM

## 2016-09-09 DIAGNOSIS — D649 Anemia, unspecified: Secondary | ICD-10-CM | POA: Diagnosis present

## 2016-09-09 DIAGNOSIS — E78019 Familial hypercholesterolemia, unspecified: Secondary | ICD-10-CM

## 2016-09-09 DIAGNOSIS — Z452 Encounter for adjustment and management of vascular access device: Secondary | ICD-10-CM

## 2016-09-09 DIAGNOSIS — J449 Chronic obstructive pulmonary disease, unspecified: Secondary | ICD-10-CM | POA: Diagnosis present

## 2016-09-09 DIAGNOSIS — N400 Enlarged prostate without lower urinary tract symptoms: Secondary | ICD-10-CM | POA: Diagnosis present

## 2016-09-09 DIAGNOSIS — I5032 Chronic diastolic (congestive) heart failure: Secondary | ICD-10-CM | POA: Diagnosis present

## 2016-09-09 DIAGNOSIS — G4730 Sleep apnea, unspecified: Secondary | ICD-10-CM | POA: Diagnosis present

## 2016-09-09 DIAGNOSIS — J9611 Chronic respiratory failure with hypoxia: Secondary | ICD-10-CM | POA: Diagnosis present

## 2016-09-09 DIAGNOSIS — R600 Localized edema: Secondary | ICD-10-CM

## 2016-09-09 DIAGNOSIS — Z8711 Personal history of peptic ulcer disease: Secondary | ICD-10-CM

## 2016-09-09 DIAGNOSIS — E669 Obesity, unspecified: Secondary | ICD-10-CM | POA: Diagnosis present

## 2016-09-09 DIAGNOSIS — L02612 Cutaneous abscess of left foot: Secondary | ICD-10-CM | POA: Diagnosis present

## 2016-09-09 DIAGNOSIS — R0602 Shortness of breath: Secondary | ICD-10-CM

## 2016-09-09 DIAGNOSIS — L299 Pruritus, unspecified: Secondary | ICD-10-CM | POA: Diagnosis not present

## 2016-09-09 DIAGNOSIS — J189 Pneumonia, unspecified organism: Principal | ICD-10-CM

## 2016-09-09 DIAGNOSIS — R079 Chest pain, unspecified: Secondary | ICD-10-CM | POA: Diagnosis not present

## 2016-09-09 DIAGNOSIS — Z86718 Personal history of other venous thrombosis and embolism: Secondary | ICD-10-CM

## 2016-09-09 DIAGNOSIS — N183 Chronic kidney disease, stage 3 unspecified: Secondary | ICD-10-CM

## 2016-09-09 DIAGNOSIS — E1151 Type 2 diabetes mellitus with diabetic peripheral angiopathy without gangrene: Secondary | ICD-10-CM | POA: Diagnosis present

## 2016-09-09 DIAGNOSIS — T797XXA Traumatic subcutaneous emphysema, initial encounter: Secondary | ICD-10-CM | POA: Diagnosis present

## 2016-09-09 DIAGNOSIS — G8929 Other chronic pain: Secondary | ICD-10-CM | POA: Diagnosis present

## 2016-09-09 DIAGNOSIS — I481 Persistent atrial fibrillation: Secondary | ICD-10-CM | POA: Diagnosis present

## 2016-09-09 DIAGNOSIS — L03119 Cellulitis of unspecified part of limb: Secondary | ICD-10-CM

## 2016-09-09 DIAGNOSIS — E8809 Other disorders of plasma-protein metabolism, not elsewhere classified: Secondary | ICD-10-CM | POA: Diagnosis present

## 2016-09-09 DIAGNOSIS — H9190 Unspecified hearing loss, unspecified ear: Secondary | ICD-10-CM | POA: Diagnosis present

## 2016-09-09 DIAGNOSIS — E1122 Type 2 diabetes mellitus with diabetic chronic kidney disease: Secondary | ICD-10-CM | POA: Diagnosis present

## 2016-09-09 DIAGNOSIS — L02619 Cutaneous abscess of unspecified foot: Secondary | ICD-10-CM

## 2016-09-09 DIAGNOSIS — L03116 Cellulitis of left lower limb: Secondary | ICD-10-CM | POA: Diagnosis present

## 2016-09-09 DIAGNOSIS — S91332A Puncture wound without foreign body, left foot, initial encounter: Secondary | ICD-10-CM | POA: Diagnosis present

## 2016-09-09 DIAGNOSIS — R1314 Dysphagia, pharyngoesophageal phase: Secondary | ICD-10-CM | POA: Diagnosis present

## 2016-09-09 DIAGNOSIS — M545 Low back pain: Secondary | ICD-10-CM | POA: Diagnosis present

## 2016-09-09 DIAGNOSIS — K3189 Other diseases of stomach and duodenum: Secondary | ICD-10-CM

## 2016-09-09 DIAGNOSIS — I2510 Atherosclerotic heart disease of native coronary artery without angina pectoris: Secondary | ICD-10-CM

## 2016-09-09 DIAGNOSIS — Y95 Nosocomial condition: Secondary | ICD-10-CM | POA: Diagnosis present

## 2016-09-09 DIAGNOSIS — I482 Chronic atrial fibrillation: Secondary | ICD-10-CM | POA: Diagnosis present

## 2016-09-09 DIAGNOSIS — I130 Hypertensive heart and chronic kidney disease with heart failure and stage 1 through stage 4 chronic kidney disease, or unspecified chronic kidney disease: Secondary | ICD-10-CM | POA: Diagnosis present

## 2016-09-09 DIAGNOSIS — Z7901 Long term (current) use of anticoagulants: Secondary | ICD-10-CM

## 2016-09-09 DIAGNOSIS — J440 Chronic obstructive pulmonary disease with acute lower respiratory infection: Secondary | ICD-10-CM | POA: Diagnosis present

## 2016-09-09 DIAGNOSIS — I4891 Unspecified atrial fibrillation: Secondary | ICD-10-CM

## 2016-09-09 DIAGNOSIS — J411 Mucopurulent chronic bronchitis: Secondary | ICD-10-CM

## 2016-09-09 DIAGNOSIS — E7801 Familial hypercholesterolemia: Secondary | ICD-10-CM

## 2016-09-09 DIAGNOSIS — W450XXA Nail entering through skin, initial encounter: Secondary | ICD-10-CM

## 2016-09-09 DIAGNOSIS — Z66 Do not resuscitate: Secondary | ICD-10-CM | POA: Diagnosis present

## 2016-09-09 DIAGNOSIS — J441 Chronic obstructive pulmonary disease with (acute) exacerbation: Secondary | ICD-10-CM

## 2016-09-09 DIAGNOSIS — E785 Hyperlipidemia, unspecified: Secondary | ICD-10-CM | POA: Diagnosis present

## 2016-09-09 DIAGNOSIS — Z794 Long term (current) use of insulin: Secondary | ICD-10-CM

## 2016-09-09 DIAGNOSIS — R1310 Dysphagia, unspecified: Secondary | ICD-10-CM

## 2016-09-09 DIAGNOSIS — K219 Gastro-esophageal reflux disease without esophagitis: Secondary | ICD-10-CM | POA: Diagnosis present

## 2016-09-09 DIAGNOSIS — Z6831 Body mass index (BMI) 31.0-31.9, adult: Secondary | ICD-10-CM

## 2016-09-09 LAB — CBC
HEMATOCRIT: 29.8 % — AB (ref 39.0–52.0)
HEMATOCRIT: 27.5 % — AB (ref 39.0–52.0)
HEMOGLOBIN: 8.8 g/dL — AB (ref 13.0–17.0)
Hemoglobin: 9.6 g/dL — ABNORMAL LOW (ref 13.0–17.0)
MCH: 27.4 pg (ref 26.0–34.0)
MCH: 26.9 pg (ref 26.0–34.0)
MCHC: 32.2 g/dL (ref 30.0–36.0)
MCHC: 32 g/dL (ref 30.0–36.0)
MCV: 85.1 fL (ref 78.0–100.0)
MCV: 84.1 fL (ref 78.0–100.0)
Platelets: 301 10*3/uL (ref 150–400)
Platelets: 299 10*3/uL (ref 150–400)
RBC: 3.5 MIL/uL — AB (ref 4.22–5.81)
RBC: 3.27 MIL/uL — ABNORMAL LOW (ref 4.22–5.81)
RDW: 13.9 % (ref 11.5–15.5)
RDW: 13.8 % (ref 11.5–15.5)
WBC: 14 10*3/uL — AB (ref 4.0–10.5)
WBC: 15 10*3/uL — ABNORMAL HIGH (ref 4.0–10.5)

## 2016-09-09 LAB — BASIC METABOLIC PANEL
ANION GAP: 7 (ref 5–15)
Anion gap: 5 (ref 5–15)
BUN: 11 mg/dL (ref 6–20)
BUN: 10 mg/dL (ref 6–20)
CALCIUM: 9.6 mg/dL (ref 8.9–10.3)
CHLORIDE: 98 mmol/L — AB (ref 101–111)
CO2: 31 mmol/L (ref 22–32)
CO2: 31 mmol/L (ref 22–32)
Calcium: 9.4 mg/dL (ref 8.9–10.3)
Chloride: 96 mmol/L — ABNORMAL LOW (ref 101–111)
Creatinine, Ser: 1.23 mg/dL (ref 0.61–1.24)
Creatinine, Ser: 1.34 mg/dL — ABNORMAL HIGH (ref 0.61–1.24)
GFR calc Af Amer: >60 mL/min (ref >60)
GFR calc Af Amer: 55 mL/min — ABNORMAL LOW (ref >60)
GFR calc non Af Amer: 52 mL/min — ABNORMAL LOW (ref >60)
GFR calc non Af Amer: 47 mL/min — ABNORMAL LOW (ref >60)
GLUCOSE: 158 mg/dL — AB (ref 65–99)
GLUCOSE: 190 mg/dL — AB (ref 65–99)
POTASSIUM: 3.5 mmol/L (ref 3.5–5.1)
POTASSIUM: 3.7 mmol/L (ref 3.5–5.1)
Sodium: 134 mmol/L — ABNORMAL LOW (ref 135–145)
Sodium: 134 mmol/L — ABNORMAL LOW (ref 135–145)

## 2016-09-09 LAB — AEROBIC/ANAEROBIC CULTURE W GRAM STAIN (SURGICAL/DEEP WOUND): Gram Stain: NONE SEEN

## 2016-09-09 LAB — AEROBIC/ANAEROBIC CULTURE (SURGICAL/DEEP WOUND)

## 2016-09-09 LAB — TYPE AND SCREEN
ABO/RH(D): O NEG
Antibody Screen: NEGATIVE

## 2016-09-09 LAB — GLUCOSE, CAPILLARY: Glucose-Capillary: 158 mg/dL — ABNORMAL HIGH (ref 65–99)

## 2016-09-09 LAB — TROPONIN I: Troponin I: <0.03 ng/mL (ref <0.03)

## 2016-09-09 LAB — BRAIN NATRIURETIC PEPTIDE: B Natriuretic Peptide: 404 pg/mL — ABNORMAL HIGH (ref 0.0–100.0)

## 2016-09-09 NOTE — H&P (Addendum)
History and Physical    [name] QMV:[id number] DOB: August 25, 1931 DOA: 09/09/2016  PCP: [name], MD  Patient coming from: SNF.   Chief Complaint:  Bleeding on right hand.   HPI: [name] is an 83 y.o. male just discharge yesterday to SNF, having had I and D of left foot, PICC line and currently on IV Van/Zosyn, hx of HTN, HLD, CKD, brought to the ER as he has bleeding on his right dorsum of the hand and some chest pain with coughing.   He has ben on Eliquis.  The bleeding was stopped with local pressure.  CXR however showed new consolidation,  WBC at 15K, and Cr of 1.4.  Hospitalist was asked to readmit him for new developing PNA and to watch for any further bleeding.   ED Course:  See above.  Rewiew of Systems:  Constitutional: Negative for malaise, fever and chills. No significant weight loss or weight gain Eyes: Negative for eye pain, redness and discharge, diplopia, visual changes, or flashes of light. ENMT: Negative for ear pain, hoarseness, nasal congestion, sinus pressure and sore throat. No headaches; tinnitus, drooling, or problem swallowing. Cardiovascular: Negative for chest pain, palpitations, diaphoresis, dyspnea and peripheral edema. ; No orthopnea, PND Respiratory: Negative for cough, hemoptysis, wheezing and stridor. No pleuritic chestpain. Gastrointestinal: Negative for diarrhea, constipation,  melena, blood in stool, hematemesis, jaundice and rectal bleeding.    Genitourinary: Negative for frequency, dysuria, incontinence,flank pain and hematuria; Musculoskeletal: Negative for back pain and neck pain. Negative for swelling and trauma.;  Skin: . Negative for pruritus, rash, abrasions, bruising and skin lesion.; ulcerations Neuro: Negative for headache, lightheadedness and neck stiffness. Negative for weakness, altered level of consciousness , altered mental status, extremity weakness, burning feet, involuntary movement, seizure and syncope.  Psych:  negative for anxiety, depression, insomnia, tearfulness, panic attacks, hallucinations, paranoia, suicidal or homicidal ideation    Past Medical History:  Diagnosis Date  . BPH (benign prostatic hyperplasia)   . CAD (coronary artery disease)    Multivessel status post CABG 2011 - LIMA to LAD, SVG to diagonal, SVG to OM, SVG to PDA  . Cellulitis    12/15  . Chronic back pain   . Chronic diastolic CHF (congestive heart failure) ([place])   . CKD (chronic kidney disease), stage III   . COPD (chronic obstructive pulmonary disease) ([place])   . Essential hypertension   . Gastric mass    EGD 9/15  . GERD (gastroesophageal reflux disease)   . History of DVT (deep vein thrombosis)    Postphlebitic syndrome  . History of kidney stones   . HOH (hard of hearing)   . Hx of CABG   . Hyperlipidemia   . Persistent atrial fibrillation ([place])    a. s/p DCCV 03/2016.  [name] Sleep apnea    Stop Bang score of 5  . Type 2 diabetes mellitus ([place])    Past Surgical History:  Procedure Laterality Date  . CARDIOVERSION N/A 03/20/2016   Procedure: CARDIOVERSION;  Surgeon: Arnoldo Lenis, MD;  Location: [facility];  Service: Endoscopy;  Laterality: N/A;  . CATARACT EXTRACTION W/PHACO Left 02/07/2016   Procedure: CATARACT EXTRACTION PHACO AND INTRAOCULAR LENS PLACEMENT (IOC);  Surgeon: [name], MD;  Location: [facility] ORS;  Service: Ophthalmology;  Laterality: Left;  CDE:  23.13  . COLONOSCOPY  2004   Dr. Laural Golden: three small polyps at cecum, path unknown, external hemorrhoids  . COLONOSCOPY N/A 08/16/2013   Dr. [name]: incomplete prep. multiple tubular  adenomas, multiple biopsies. Needs surveillance in Aug 2016 due to poor prep  . CORONARY ARTERY BYPASS GRAFT     x5  . CORONARY ARTERY BYPASS GRAFT  2011  . CYSTOSCOPY N/A 12/12/2012   Procedure: CYSTOSCOPY FLEXIBLE;  Surgeon: Marissa Nestle, MD;  Location: [facility] ORS;  Service: Urology;  Laterality: N/A;  . ESOPHAGOGASTRODUODENOSCOPY N/A 08/16/2013   Dr. [name]:  normal esophagus s/p Maloney dilation, gastric erosions, submucosal gastric mass vs extrinsic mass  . EUS N/A 09/07/2013   Dr. 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 INCISION AND DRAINAGE ABSCESS N/A 12/20/2013   Procedure: INCISION AND DRAINAGE ABSCESS NECK;  Surgeon: Jamesetta So, MD;  Location: [facility] ORS;  Service: General;  Laterality: N/A;  . INCISION AND DRAINAGE ABSCESS Left 09/04/2016   Procedure: INCISION AND DRAINAGE ABSCESS;  Surgeon: Aviva Signs, MD;  Location: [facility] ORS;  Service: General;  Laterality: Left;  [name] MALONEY DILATION N/A 08/16/2013   Procedure: Venia Minks DILATION;  Surgeon: Daneil Dolin, MD;  Location: [facility];  Service: Endoscopy;  Laterality: N/A;     reports that he quit smoking about 60 years ago. He has a 11.00 pack-year smoking history. He has never used smokeless tobacco. He reports that he does not drink alcohol or use drugs.  No Known Allergies  Family History  Problem Relation Age of Onset  . Colon cancer Son 72       deceased     Prior to Admission medications   Medication Sig Start Date End Date Taking? Authorizing Provider  apixaban (ELIQUIS) 5 MG TABS tablet Take 5 mg by mouth 2 (two) times daily.   Yes [provider]  cloNIDine (CATAPRES) 0.1 MG tablet Take 0.1 mg by mouth 2 (two) times daily.   Yes [provider]  docusate sodium (COLACE) 100 MG capsule Take 100 mg by mouth 2 (two) times daily.   Yes [provider]  FLUoxetine (PROZAC) 20 MG/5ML solution Take 5 mLs (20 mg total) by mouth daily. 09/09/16  Yes [name], MD  insulin glargine (LANTUS) 100 UNIT/ML injection Inject 0.3 mLs (30 Units total) into the skin at bedtime. 09/08/16  Yes Dondiego, Richard, MD  ipratropium-albuterol (DUONEB) 0.5-2.5 (3) MG/3ML SOLN Take 3 mLs by nebulization every 6 (six) hours as needed (wheezing).   Yes [provider]  LORazepam (ATIVAN) 1 MG tablet Take 1 tablet (1 mg total) by mouth at  bedtime. 09/08/16 10/08/16 Yes Dondiego, Delfino Lovett, MD  mupirocin cream (BACTROBAN) 2 % Apply topically 2 (two) times daily. 09/08/16  Yes [name], MD  nitroGLYCERIN (NITROSTAT) 0.4 MG SL tablet Place 1 tablet (0.4 mg total) under the tongue every 5 (five) minutes as needed for chest pain. 12/22/13  Yes Dondiego, Delfino Lovett, MD  oxyCODONE (ROXICODONE) 15 MG immediate release tablet Take 30 mg by mouth every 8 (eight) hours as needed for pain.    Yes [provider]  piperacillin-tazobactam (ZOSYN) IVPB Inject 3.375 g into the vein every 8 (eight) hours. Indication:  cellulitis Last Day of Therapy:  9/12//18 Labs - Once weekly:  CBC/D and BMP, Labs - Every other week:  ESR and CRP 09/08/16 09/15/16 Yes Dondiego, Richard, MD  povidone-iodine (BETADINE) 10 % external solution Apply topically as needed for wound care. Patient taking differently: Apply topically 2 (two) times daily. Soaks left foot 09/08/16  Yes Dondiego, Richard, MD  rosuvastatin (CRESTOR) 10 MG tablet Take 10 mg by mouth daily.   Yes [provider]  vancomycin IVPB Inject 1,000 mg into the vein daily. Indication:  cellulitis Last Day of Therapy:  09/16/16 Labs - Sunday/Monday:  CBC/D, BMP, and vancomycin trough. Labs - Thursday:  BMP and vancomycin trough Labs - Every other week:  ESR and CRP 09/08/16 09/15/16 Yes Dondiego, Richard, MD  metoprolol succinate (TOPROL-XL) 25 MG 24 hr tablet Take 3 tablets (75 mg total) by mouth daily. Take with or immediately following a meal. Patient not taking: Reported on 09/09/2016 06/16/16   [name], MD    Physical Exam: Vitals:   09/09/16 2006 09/09/16 2007 09/09/16 2245  BP: (!) 162/92  (!) 156/75  Pulse: 96  95  Resp: 19  19  Temp: (!) 97.5 F (36.4 C)    TempSrc: Oral    SpO2: 99%  100%  Weight:  98 kg (216 lb)   Height:  _0  (1.803 m)       Constitutional: NAD, calm, comfortable Vitals:   09/09/16 2006 09/09/16 2007 09/09/16 2245  BP: (!) 162/92  (!)  156/75  Pulse: 96  95  Resp: 19  19  Temp: (!) 97.5 F (36.4 C)    TempSrc: Oral    SpO2: 99%  100%  Weight:  98 kg (216 lb)   Height:  _1  (1.803 m)    Eyes: PERRL, lids and conjunctivae normal ENMT: Mucous membranes are moist. Posterior pharynx clear of any exudate or lesions.Normal dentition.  Neck: normal, supple, no masses, no thyromegaly Respiratory: clear to auscultation bilaterally, no wheezing, no crackles. Normal respiratory effort. No accessory muscle use.  Cardiovascular: Regular rate and rhythm, no murmurs / rubs / gallops. No extremity edema. 2+ pedal pulses. No carotid bruits.  Abdomen: no tenderness, no masses palpated. No hepatosplenomegaly. Bowel sounds positive.  Musculoskeletal: no clubbing / cyanosis. No joint deformity upper and lower extremities. Good ROM, no contractures. Normal muscle tone.  Skin: no rashes, lesions, ulcers. No induration.  Bleeding on hand has stopped.   Neurologic: CN 2-12 grossly intact. Sensation intact, DTR normal. Strength 5/5 in all 4.  Psychiatric: Normal judgment and insight. Alert and oriented x 3. Normal mood.    Labs on Admission: I have personally reviewed following labs and imaging studies  CBC:  Recent Labs Lab 09/05/16 0644 09/07/16 0604 09/09/16 0456 09/09/16 2115  WBC 28.4* 16.2* 14.0* 15.0*  HGB 10.8* 10.4* 9.6* 8.8*  HCT 34.4* 32.9* 29.8* 27.5*  MCV 87.5 86.4 85.1 84.1  PLT 271 324 301 458   Basic Metabolic Panel:  Recent Labs Lab 09/05/16 0609 09/06/16 0549 09/07/16 0604 09/09/16 0456 09/09/16 2115  NA 137 137 137 134* 134*  K 4.2 3.7 3.5 3.5 3.7  CL 101 100* 98* 98* 96*  CO2 _2 GLUCOSE 210* 159* 137* 158* 190*  BUN 27* 21* _3 CREATININE 1.59* 1.27* 1.16 1.23 1.34*  CALCIUM 10.0 10.1 10.0 9.4 9.6   Cardiac Enzymes:  Recent Labs Lab 09/09/16 2115  TROPONINI <0.03   CBG:  Recent Labs Lab 09/08/16 0754 09/08/16 1105 09/08/16 1704 09/08/16 2031 09/09/16 0857    GLUCAP 79 155* 205* 231* 158*   Urine analysis:    Component Value Date/Time   COLORURINE YELLOW 09/01/2016 1545   APPEARANCEUR HAZY (A) 09/01/2016 1545   LABSPEC 1.012 09/01/2016 1545   PHURINE 5.0 09/01/2016 1545   GLUCOSEU >=500 (A) 09/01/2016 1545   HGBUR SMALL (A) 09/01/2016 1545   BILIRUBINUR NEGATIVE 09/01/2016 1545   KETONESUR 5 (  A) 09/01/2016 1545   PROTEINUR 100 (A) 09/01/2016 1545   UROBILINOGEN 0.2 12/19/2013 1905   NITRITE NEGATIVE 09/01/2016 1545   LEUKOCYTESUR NEGATIVE 09/01/2016 1545    Recent Results (from the past 240 hour(s))  Blood culture (routine x 2)     Status: None   Collection Time: 09/01/16 11:00 AM  Result Value Ref Range Status   Specimen Description RIGHT ANTECUBITAL  Final   Special Requests   Final    BOTTLES DRAWN AEROBIC AND ANAEROBIC Blood Culture adequate volume   Culture NO GROWTH 5 DAYS  Final   Report Status 09/06/2016 FINAL  Final  Blood culture (routine x 2)     Status: None   Collection Time: 09/01/16 11:08 AM  Result Value Ref Range Status   Specimen Description BLOOD RIGHT WRIST  Final   Special Requests   Final    BOTTLES DRAWN AEROBIC AND ANAEROBIC Blood Culture adequate volume   Culture NO GROWTH 5 DAYS  Final   Report Status 09/06/2016 FINAL  Final  Surgical pcr screen     Status: None   Collection Time: 09/03/16 11:33 AM  Result Value Ref Range Status   MRSA, PCR NEGATIVE NEGATIVE Final   Staphylococcus aureus NEGATIVE NEGATIVE Final    Comment: (NOTE) The Xpert SA Assay (FDA approved for NASAL specimens in patients 60 years of age and older), is one component of a comprehensive surveillance program. It is not intended to diagnose infection nor to guide or monitor treatment.   Aerobic/Anaerobic Culture (surgical/deep wound)     Status: None   Collection Time: 09/04/16 10:16 AM  Result Value Ref Range Status   Specimen Description FOOT LEFT  Final   Special Requests NONE  Final   Gram Stain   Final    NO WBC  SEEN RARE SQUAMOUS EPITHELIAL CELLS PRESENT FEW GRAM POSITIVE COCCI IN PAIRS RARE GRAM NEGATIVE RODS    Culture   Final    ABUNDANT ENTEROCOCCUS FAECALIS FEW KLEBSIELLA OXYTOCA NO ANAEROBES ISOLATED Performed at [facility], [place] [address], [place], [place] [address]    Report Status 09/09/2016 FINAL  Final   Organism ID, Bacteria ENTEROCOCCUS FAECALIS  Final   Organism ID, Bacteria KLEBSIELLA OXYTOCA  Final      Susceptibility   Enterococcus faecalis - MIC*    AMPICILLIN <=2 SENSITIVE Sensitive     VANCOMYCIN 1 SENSITIVE Sensitive     GENTAMICIN SYNERGY SENSITIVE Sensitive     * ABUNDANT ENTEROCOCCUS FAECALIS   Klebsiella oxytoca - MIC*    AMPICILLIN >=32 RESISTANT Resistant     CEFAZOLIN 8 SENSITIVE Sensitive     CEFEPIME <=1 SENSITIVE Sensitive     CEFTAZIDIME <=1 SENSITIVE Sensitive     CEFTRIAXONE <=1 SENSITIVE Sensitive     CIPROFLOXACIN <=0.25 SENSITIVE Sensitive     GENTAMICIN <=1 SENSITIVE Sensitive     IMIPENEM <=0.25 SENSITIVE Sensitive     TRIMETH/SULFA <=20 SENSITIVE Sensitive     AMPICILLIN/SULBACTAM 16 INTERMEDIATE Intermediate     PIP/TAZO <=4 SENSITIVE Sensitive     Extended ESBL NEGATIVE Sensitive     * FEW KLEBSIELLA OXYTOCA     Radiological Exams on Admission: Dg Chest 2 View  Result Date: 09/09/2016 CLINICAL DATA:  Central chest pain on off for 4 weeks. History of CHF, diabetes, hypertension, coronary artery disease, gastroesophageal reflux disease, DVT, COPD, atrial fibrillation, chronic kidney disease, coronary artery bypass. EXAM: CHEST  2 VIEW COMPARISON:  09/01/2016 FINDINGS: Postoperative changes in the mediastinum. Cardiac  enlargement without significant vascular congestion. Increasing interstitial pattern to the lung bases particularly on the left may indicate developing interstitial edema or pneumonia. Small left pleural effusion has developed since previous study. Atelectasis or infiltration in the left lung base is also developing.  Calcification of the aorta. No pneumothorax. Right PICC line with tip over the low SVC region. Degenerative changes in the spine and shoulders. IMPRESSION: Cardiac enlargement. Developing consolidation or atelectasis in the left lung base with new small left pleural effusion and increasing bibasilar interstitial changes. Electronically Signed   By: Lucienne Capers M.D.   On: 09/09/2016 21:08    EKG: Independently reviewed.   Assessment/Plan Active Problems:   HCAP (healthcare-associated pneumonia)   COPD (chronic obstructive pulmonary disease) (HCC)   CAD (coronary artery disease)   Hyperlipidemia   PLAN:   HCAP:  He is probably adequately covered for his infection, however, given he developed PNA on IV Zosyn, will change it to Cefepime.  I would continue with IV Lucianne Lei. He is not in any respiratory distress.    Chest pain:   I suspect he has chest pain from coughing.  Will place on telemetry and perform r/out.   Cellulitis:  Continue with previous regimen, IV Van.  Change Zosyn to Cefepime.  COPD  Stable.  Will continue with nebs.  Persistent Afib:   Will continue with Eliquis.  Rate is controlled.   CAD:  Stable.  No chest pain.    DVT prophylaxis: Eliquis.  Code Status: DNR.  Confirmed.  Family Communication: Friend at bedside.  Disposition Plan: SNF.  Consults called: None.  Admission status: inpatient.    Daniel Johndrow MD FACP. [facility]  If 7PM-7AM, please contact night-coverage [email or web address] Password [email or web address]  09/09/2016, 11:16 PM

## 2016-09-09 NOTE — Progress Notes (Signed)
Patient discharged to [facility] skilled Nursing facility, report given to [facility]. IV discontinue, catheter intact. Vital signs stable. Patient transported with 3 liters of oxygen via nasal canula. Family at bedside. Staff accompanied patient to awaiting floor.

## 2016-09-09 NOTE — Progress Notes (Addendum)
Pt had blood blister on right hand pop during night. Bleeding was controlled at first, around 0230 bleeding started again and wouldn't stop. MD made aware, labs ordered, lovenox discontinued and told to continue to monitor. Vitals stable.

## 2016-09-09 NOTE — Progress Notes (Signed)
Spoke with Dr. Cindie Laroche to make him aware of pt's bleeding and lab results. Will continue to monitor.

## 2016-09-09 NOTE — ED Triage Notes (Signed)
Cp in center of chest on and off x 4 weeks.  Pt also has hematoma to rt hand x 3 weeks, small open area that is oozing bright red blood, unwrapped dressing to hand moderate amount of blood on gauze.  Rewrapped with coban and abd pad

## 2016-09-09 NOTE — Progress Notes (Signed)
Patient bleeding from right hand due to blister this am, orders received and given. Hemostatin dressing applied to area,pressure applied, bleeding was controlled. Family at bedside. Will continue to monitor patients.

## 2016-09-10 LAB — CBC
HEMATOCRIT: 26.3 % — AB (ref 39.0–52.0)
HEMOGLOBIN: 8.5 g/dL — AB (ref 13.0–17.0)
MCH: 27.5 pg (ref 26.0–34.0)
MCHC: 32.3 g/dL (ref 30.0–36.0)
MCV: 85.1 fL (ref 78.0–100.0)
Platelets: 268 10*3/uL (ref 150–400)
RBC: 3.09 MIL/uL — AB (ref 4.22–5.81)
RDW: 13.9 % (ref 11.5–15.5)
WBC: 12.3 10*3/uL — ABNORMAL HIGH (ref 4.0–10.5)

## 2016-09-10 LAB — CBC WITH DIFFERENTIAL/PLATELET
BASOS ABS: 0 10*3/uL (ref 0.0–0.1)
BASOS PCT: 0 %
EOS ABS: 0.5 10*3/uL (ref 0.0–0.7)
EOS PCT: 3 %
HCT: 26.8 % — ABNORMAL LOW (ref 39.0–52.0)
Hemoglobin: 8.7 g/dL — ABNORMAL LOW (ref 13.0–17.0)
LYMPHS PCT: 6 %
Lymphs Abs: 0.9 10*3/uL (ref 0.7–4.0)
MCH: 27.4 pg (ref 26.0–34.0)
MCHC: 32.5 g/dL (ref 30.0–36.0)
MCV: 84.5 fL (ref 78.0–100.0)
Monocytes Absolute: 1.4 10*3/uL — ABNORMAL HIGH (ref 0.1–1.0)
Monocytes Relative: 10 %
Neutro Abs: 11.8 10*3/uL — ABNORMAL HIGH (ref 1.7–7.7)
Neutrophils Relative %: 81 %
PLATELETS: 289 10*3/uL (ref 150–400)
RBC: 3.17 MIL/uL — AB (ref 4.22–5.81)
RDW: 13.9 % (ref 11.5–15.5)
WBC: 14.6 10*3/uL — AB (ref 4.0–10.5)

## 2016-09-10 LAB — COMPREHENSIVE METABOLIC PANEL
ALK PHOS: 142 U/L — AB (ref 38–126)
ALT: 22 U/L (ref 17–63)
ANION GAP: 5 (ref 5–15)
AST: 16 U/L (ref 15–41)
Albumin: 2.2 g/dL — ABNORMAL LOW (ref 3.5–5.0)
BILIRUBIN TOTAL: 0.5 mg/dL (ref 0.3–1.2)
BUN: 10 mg/dL (ref 6–20)
CALCIUM: 9.7 mg/dL (ref 8.9–10.3)
CO2: 33 mmol/L — AB (ref 22–32)
Chloride: 97 mmol/L — ABNORMAL LOW (ref 101–111)
Creatinine, Ser: 1.12 mg/dL (ref 0.61–1.24)
GFR calc non Af Amer: 59 mL/min — ABNORMAL LOW (ref >60)
Glucose, Bld: 154 mg/dL — ABNORMAL HIGH (ref 65–99)
POTASSIUM: 3.6 mmol/L (ref 3.5–5.1)
SODIUM: 135 mmol/L (ref 135–145)
TOTAL PROTEIN: 5.5 g/dL — AB (ref 6.5–8.1)

## 2016-09-10 LAB — GLUCOSE, CAPILLARY
GLUCOSE-CAPILLARY: 179 mg/dL — AB (ref 65–99)
GLUCOSE-CAPILLARY: 164 mg/dL — AB (ref 65–99)
Glucose-Capillary: 189 mg/dL — ABNORMAL HIGH (ref 65–99)
Glucose-Capillary: 162 mg/dL — ABNORMAL HIGH (ref 65–99)

## 2016-09-10 LAB — TROPONIN I: Troponin I: <0.03 ng/mL (ref <0.03)

## 2016-09-10 LAB — TSH: TSH: 1.721 u[IU]/mL (ref 0.350–4.500)

## 2016-09-10 MED ORDER — LORAZEPAM 1 MG PO TABS
1.0000 mg | ORAL_TABLET | Freq: Every day | ORAL | Status: DC
  Administered 2016-09-10 – 2016-09-14 (×6): 1 mg via ORAL
  Filled 2016-09-10 (×6): qty 1

## 2016-09-10 MED ORDER — FLUOXETINE HCL 20 MG/5ML PO SOLN
20.0000 mg | Freq: Every day | ORAL | Status: DC
  Administered 2016-09-10 – 2016-09-15 (×5): 20 mg via ORAL
  Filled 2016-09-10 (×7): qty 5

## 2016-09-10 MED ORDER — VANCOMYCIN HCL IN DEXTROSE 1-5 GM/200ML-% IV SOLN
1000.0000 mg | INTRAVENOUS | Status: DC
  Administered 2016-09-10 – 2016-09-11 (×2): 1000 mg via INTRAVENOUS
  Filled 2016-09-10 (×2): qty 200

## 2016-09-10 MED ORDER — ROSUVASTATIN CALCIUM 10 MG PO TABS
10.0000 mg | ORAL_TABLET | Freq: Every day | ORAL | Status: DC
  Administered 2016-09-10 – 2016-09-15 (×5): 10 mg via ORAL
  Filled 2016-09-10 (×5): qty 1

## 2016-09-10 MED ORDER — SODIUM CHLORIDE 0.9% FLUSH
3.0000 mL | Freq: Two times a day (BID) | INTRAVENOUS | Status: DC
  Administered 2016-09-10 – 2016-09-15 (×9): 3 mL via INTRAVENOUS

## 2016-09-10 MED ORDER — APIXABAN 5 MG PO TABS
5.0000 mg | ORAL_TABLET | Freq: Two times a day (BID) | ORAL | Status: DC
  Administered 2016-09-10 – 2016-09-12 (×5): 5 mg via ORAL
  Filled 2016-09-10 (×5): qty 1

## 2016-09-10 MED ORDER — CLONIDINE HCL 0.1 MG PO TABS
0.1000 mg | ORAL_TABLET | Freq: Two times a day (BID) | ORAL | Status: DC
  Administered 2016-09-10 – 2016-09-15 (×11): 0.1 mg via ORAL
  Filled 2016-09-10 (×11): qty 1

## 2016-09-10 MED ORDER — NITROGLYCERIN 0.4 MG SL SUBL
0.4000 mg | SUBLINGUAL_TABLET | SUBLINGUAL | Status: DC | PRN
  Filled 2016-09-10: qty 1

## 2016-09-10 MED ORDER — ORAL CARE MOUTH RINSE
15.0000 mL | Freq: Two times a day (BID) | OROMUCOSAL | Status: DC
  Administered 2016-09-10 – 2016-09-14 (×9): 15 mL via OROMUCOSAL

## 2016-09-10 MED ORDER — DEXTROSE 5 % IV SOLN
1.0000 g | Freq: Three times a day (TID) | INTRAVENOUS | Status: DC
  Administered 2016-09-10 – 2016-09-11 (×4): 1 g via INTRAVENOUS
  Filled 2016-09-10 (×5): qty 1

## 2016-09-10 MED ORDER — DEXTROSE 5 % IV SOLN
2.0000 g | Freq: Once | INTRAVENOUS | Status: AC
  Administered 2016-09-10: 2 g via INTRAVENOUS
  Filled 2016-09-10 (×2): qty 2

## 2016-09-10 MED ORDER — VANCOMYCIN IV (FOR PTA / DISCHARGE USE ONLY): 1000.0000 mg | INTRAVENOUS | Status: DC

## 2016-09-10 MED ORDER — OXYCODONE HCL 5 MG PO TABS
30.0000 mg | ORAL_TABLET | Freq: Three times a day (TID) | ORAL | Status: DC | PRN
  Administered 2016-09-10 – 2016-09-12 (×3): 30 mg via ORAL
  Filled 2016-09-10 (×5): qty 6

## 2016-09-10 MED ORDER — DOCUSATE SODIUM 100 MG PO CAPS
100.0000 mg | ORAL_CAPSULE | Freq: Two times a day (BID) | ORAL | Status: DC
  Administered 2016-09-10 – 2016-09-15 (×11): 100 mg via ORAL
  Filled 2016-09-10 (×11): qty 1

## 2016-09-10 MED ORDER — IPRATROPIUM-ALBUTEROL 0.5-2.5 (3) MG/3ML IN SOLN
3.0000 mL | Freq: Four times a day (QID) | RESPIRATORY_TRACT | Status: DC | PRN
  Administered 2016-09-10 (×2): 3 mL via RESPIRATORY_TRACT
  Filled 2016-09-10: qty 3

## 2016-09-10 MED ORDER — INSULIN GLARGINE 100 UNIT/ML ~~LOC~~ SOLN
30.0000 [IU] | Freq: Every day | SUBCUTANEOUS | Status: DC
Start: 2016-09-10 — End: 2016-09-15
  Administered 2016-09-10 – 2016-09-14 (×6): 30 [IU] via SUBCUTANEOUS
  Filled 2016-09-10 (×7): qty 0.3

## 2016-09-10 MED ORDER — POVIDONE-IODINE 10 % EX SOLN
Freq: Two times a day (BID) | CUTANEOUS | Status: DC
  Administered 2016-09-10 – 2016-09-11 (×2): via TOPICAL
  Filled 2016-09-10: qty 118

## 2016-09-10 MED ORDER — IPRATROPIUM-ALBUTEROL 0.5-2.5 (3) MG/3ML IN SOLN
3.0000 mL | Freq: Three times a day (TID) | RESPIRATORY_TRACT | Status: DC
  Administered 2016-09-10 – 2016-09-15 (×14): 3 mL via RESPIRATORY_TRACT
  Filled 2016-09-10 (×15): qty 3

## 2016-09-10 MED ORDER — MUPIROCIN CALCIUM 2 % EX CREA
TOPICAL_CREAM | Freq: Two times a day (BID) | CUTANEOUS | Status: DC
  Administered 2016-09-10 – 2016-09-11 (×4): via TOPICAL
  Filled 2016-09-10: qty 15

## 2016-09-10 MED ORDER — METOPROLOL SUCCINATE ER 50 MG PO TB24
75.0000 mg | ORAL_TABLET | Freq: Every day | ORAL | Status: DC
  Administered 2016-09-10 – 2016-09-15 (×5): 75 mg via ORAL
  Filled 2016-09-10 (×5): qty 1

## 2016-09-10 NOTE — Progress Notes (Signed)
Pt unsure if he has had regular night time meds, called nurse at [facility], she states pt has not had any of his regular meds, will continue as ordered.

## 2016-09-10 NOTE — ED Provider Notes (Signed)
Emergency Department Provider Note   I have reviewed the triage vital signs and the nursing notes.   HISTORY  Chief Complaint Chest Pain   HPI [name] is a 83 y.o. male who was just discharged from the hospital secondary to cellulitis and was at the peds center when he had chest pain since that here for further evaluation. Has some shortness of breath with it. Patient states this been on and off for the last few days even though he was just discharged yesterday. States is kind of sharp in nature does wrap around to his back a little bit. Has had a little bit of cough and no fevers. No history of the same. No modifying factors. No associated symptoms. Pain is moderate in nature.   Past Medical History:  Diagnosis Date  . BPH (benign prostatic hyperplasia)   . CAD (coronary artery disease)    Multivessel status post CABG 2011 - LIMA to LAD, SVG to diagonal, SVG to OM, SVG to PDA  . Cellulitis    12/15  . Chronic back pain   . Chronic diastolic CHF (congestive heart failure) ([place])   . CKD (chronic kidney disease), stage III   . COPD (chronic obstructive pulmonary disease) ([place])   . Essential hypertension   . Gastric mass    EGD 9/15  . GERD (gastroesophageal reflux disease)   . History of DVT (deep vein thrombosis)    Postphlebitic syndrome  . History of kidney stones   . HOH (hard of hearing)   . Hx of CABG   . Hyperlipidemia   . Persistent atrial fibrillation ([place])    a. s/p DCCV 03/2016.  [name] Sleep apnea    Stop Bang score of 5  . Type 2 diabetes mellitus [facility])     Patient Active Problem List   Diagnosis Date Noted  . HCAP (healthcare-associated pneumonia) 09/09/2016  . COPD (chronic obstructive pulmonary disease) ([place]) 09/09/2016  . CAD (coronary artery disease) 09/09/2016  . Hyperlipidemia 09/09/2016  . Diabetic foot infection ([place]) 09/01/2016  . Cellulitis 08/20/2016  . Type 2 diabetes mellitus ([place]) 07/24/2016  . Peripheral edema   . Pressure injury  of skin 06/11/2016  . Atrial fibrillation with normal ventricular rate ([place]) 06/10/2016  . Esophageal stricture 06/09/2016  . CAD in native artery 06/08/2016  . Chest pain 06/08/2016  . CKD (chronic kidney disease), stage III 06/08/2016  . Chronic diastolic CHF (congestive heart failure) ([place]) 03/19/2016  . CAP (community acquired pneumonia) 03/19/2016  . Abnormal weight loss   . Cellulitis and abscess 12/18/2013  . Thrush, oral 12/18/2013  . Chest pain at rest 12/18/2013  . Leukocytosis 12/18/2013  . Diabetes ([place]) 12/18/2013  . Chronic back pain 12/18/2013  . Pulmonary vascular congestion 12/18/2013  . Dysphagia 12/18/2013  . Odynophagia 12/18/2013  . Gastric mass 09/07/2013  . Personal history of colonic polyps 08/05/2013  . Dysphagia, unspecified(787.20) 08/01/2013  . Nausea and vomiting 08/01/2013  . UTI (lower urinary tract infection) 12/09/2012  . Lower extremity edema 06/02/2012  . Dyspnea 06/02/2012  . Essential hypertension 06/02/2012  . History of DVT (deep vein thrombosis) 06/02/2012  . Obesity (BMI 30-39.9): BMI 31.3 06/02/2012    Past Surgical History:  Procedure Laterality Date  . CARDIOVERSION N/A 03/20/2016   Procedure: CARDIOVERSION;  Surgeon: Arnoldo Lenis, MD;  Location: [facility] ENDO SUITE;  Service: Endoscopy;  Laterality: N/A;  . CATARACT EXTRACTION W/PHACO Left 02/07/2016   Procedure: CATARACT EXTRACTION PHACO AND INTRAOCULAR LENS PLACEMENT (IOC);  Surgeon: [name], MD;  Location: [facility] ORS;  Service: Ophthalmology;  Laterality: Left;  CDE:  23.13  . COLONOSCOPY  2004   Dr. Laural Golden: three small polyps at cecum, path unknown, external hemorrhoids  . COLONOSCOPY N/A 08/16/2013   Dr. [name]: incomplete prep. multiple tubular adenomas, multiple biopsies. Needs surveillance in Aug 2016 due to poor prep  . CORONARY ARTERY BYPASS GRAFT     x5  . CORONARY ARTERY BYPASS GRAFT  2011  . CYSTOSCOPY N/A 12/12/2012   Procedure: CYSTOSCOPY FLEXIBLE;  Surgeon: Marissa Nestle, MD;  Location: [facility] ORS;  Service: Urology;  Laterality: N/A;  . ESOPHAGOGASTRODUODENOSCOPY N/A 08/16/2013   Dr. [name]: normal esophagus s/p Maloney dilation, gastric erosions, submucosal gastric mass vs extrinsic mass  . EUS N/A 09/07/2013   Dr. 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 INCISION AND DRAINAGE ABSCESS N/A 12/20/2013   Procedure: INCISION AND DRAINAGE ABSCESS NECK;  Surgeon: Jamesetta So, MD;  Location: [facility] ORS;  Service: General;  Laterality: N/A;  . INCISION AND DRAINAGE ABSCESS Left 09/04/2016   Procedure: INCISION AND DRAINAGE ABSCESS;  Surgeon: Aviva Signs, MD;  Location: [facility] ORS;  Service: General;  Laterality: Left;  [name] MALONEY DILATION N/A 08/16/2013   Procedure: Venia Minks DILATION;  Surgeon: Daneil Dolin, MD;  Location: [facility] ENDO SUITE;  Service: Endoscopy;  Laterality: N/A;    Current Outpatient Rx  . Order #: [id number] Class: Historical Med  . Order #: [id number] Class: Historical Med  . Order #: [id number] Class: Historical Med  . Order #: [id number] Class: Normal  . Order #: [id number] Class: Normal  . Order #: [id number] Class: Historical Med  . Order #: [id number] Class: Print  . Order #: [id number] Class: Normal  . Order #: [id number] Class: Print  . Order #: [id number] Class: Historical Med  . Order #: [id number] Class: Print  . Order #: [id number] Class: Normal  . Order #: [id number] Class: Historical Med  . Order #: [id number] Class: Print  . Order #: [id number] Class: Normal    Allergies Patient has no known allergies.  Family History  Problem Relation Age of Onset  . Colon cancer Son 60       deceased    Social History Social History  Substance Use Topics  . Smoking status: Former Smoker    Packs/day: 1.00    Years: 11.00    Quit date: 01/05/1956  . Smokeless tobacco: Never Used  . Alcohol use No    Review of Systems  All other systems negative except as documented in the HPI. All pertinent positives and negatives as reviewed in the  HPI. ____________________________________________  PHYSICAL EXAM:  VITAL SIGNS: ED Triage Vitals  Enc Vitals Group     BP 09/09/16 2006 (!) 162/92     Pulse Rate 09/09/16 2006 96     Resp 09/09/16 2006 19     Temp 09/09/16 2006 (!) 97.5 F (36.4 C)     Temp Source 09/09/16 2006 Oral     SpO2 09/09/16 2006 99 %     Weight 09/09/16 2007 216 lb (98 kg)     Height 09/09/16 2007 5\' 11"  (1.803 m)     Head Circumference --      Peak Flow --      Pain Score 09/09/16 2010 5     Pain Loc --      Pain Edu? --      Excl. in [place]? --     Constitutional: Alert and oriented. Well appearing and in no acute distress. Eyes: Conjunctivae  are normal. PERRL. EOMI. Head: Atraumatic. Nose: No congestion/rhinnorhea. Mouth/Throat: Mucous membranes are moist.  Oropharynx non-erythematous. Neck: No stridor.  No meningeal signs.   Cardiovascular: Normal rate, regular rhythm. Good peripheral circulation. Grossly normal heart sounds.   Respiratory: Normal respiratory effort.  No retractions. Lungs CTAB. Gastrointestinal: Soft and nontender. No distention.  Musculoskeletal: No lower extremity tenderness nor edema. No gross deformities of extremities. Neurologic:  Normal speech and language. No gross focal neurologic deficits are appreciated.  Skin:  Skin is warm, dry and intact. No rash noted.  ____________________________________________   LABS (all labs ordered are listed, but only abnormal results are displayed)  Labs Reviewed  BASIC METABOLIC PANEL - Abnormal; Notable for the following:       Result Value   Sodium 134 (*)    Chloride 96 (*)    Glucose, Bld 190 (*)    Creatinine, Ser 1.34 (*)    GFR calc non Af Amer 47 (*)    GFR calc Af Amer 55 (*)    All other components within normal limits  CBC - Abnormal; Notable for the following:    WBC 15.0 (*)    RBC 3.27 (*)    Hemoglobin 8.8 (*)    HCT 27.5 (*)    All other components within normal limits  BRAIN NATRIURETIC PEPTIDE - Abnormal;  Notable for the following:    B Natriuretic Peptide 404.0 (*)    All other components within normal limits  TROPONIN I   ____________________________________________  EKG   EKG Interpretation  Date/Time:  Wednesday September 09 2016 20:01:26 EDT Ventricular Rate:  98 PR Interval:    QRS Duration: 93 QT Interval:  354 QTC Calculation: 452 R Axis:   88 Text Interpretation:  Atrial fibrillation Borderline right axis deviation Borderline T wave abnormalities No significant change since last tracing Confirmed by Merrily Pew [phone]) on 09/10/2016 12:31:56 AM       ____________________________________________  RADIOLOGY  Dg Chest 2 View  Result Date: 09/09/2016 CLINICAL DATA:  Central chest pain on off for 4 weeks. History of CHF, diabetes, hypertension, coronary artery disease, gastroesophageal reflux disease, DVT, COPD, atrial fibrillation, chronic kidney disease, coronary artery bypass. EXAM: CHEST  2 VIEW COMPARISON:  09/01/2016 FINDINGS: Postoperative changes in the mediastinum. Cardiac enlargement without significant vascular congestion. Increasing interstitial pattern to the lung bases particularly on the left may indicate developing interstitial edema or pneumonia. Small left pleural effusion has developed since previous study. Atelectasis or infiltration in the left lung base is also developing. Calcification of the aorta. No pneumothorax. Right PICC line with tip over the low SVC region. Degenerative changes in the spine and shoulders. IMPRESSION: Cardiac enlargement. Developing consolidation or atelectasis in the left lung base with new small left pleural effusion and increasing bibasilar interstitial changes. Electronically Signed   By: Lucienne Capers M.D.   On: 09/09/2016 21:08   ____________________________________________   PROCEDURES  Procedure(s) performed:   Procedures ____________________________________________   INITIAL IMPRESSION / ASSESSMENT AND PLAN / ED  COURSE  Pertinent labs & imaging results that were available during my care of the patient were reviewed by me and considered in my medical decision making (see chart for details).  X-ray with pneumonia versus CHF. Patient also have a history of heart disease ACS needs to be ruled out. We'll admit the hospitalist for further workup. Already on vancomycin and Zosyn so no new antibiotics. ____________________________________________  FINAL CLINICAL IMPRESSION(S) / ED DIAGNOSES  Final diagnoses:  Nonspecific chest pain  MEDICATIONS GIVEN DURING THIS VISIT:  Medications - No data to display  NEW OUTPATIENT MEDICATIONS STARTED DURING THIS VISIT:  New Prescriptions   No medications on file    Note:  This document was prepared using Dragon voice recognition software and may include unintentional dictation errors.    Patricia Fargo, Corene Cornea, MD 09/10/16 [phone]

## 2016-09-10 NOTE — Progress Notes (Signed)
Spoke to [place], nurse at [facility] regarding patient's vancomycin dose, was told pt had dose at 1800 at [facility], made pharmacy aware.

## 2016-09-10 NOTE — Progress Notes (Signed)
Pharmacy Antibiotic Note  [name] is a 83 y.o. male admitted on 09/09/2016 with cellulitis and  pneumonia.  Pharmacy has been consulted for Vancomycin and Cefepime dosing.  Plan: Continue with Vancomycin 1gm IV q24h Cefepime 1gm IV q8h F/U cxs and clinical progress Monitor V/S, labs, and levels as indicated  Height: 5\' 11"  (180.3 cm) Weight: 224 lb 10.4 oz (101.9 kg) IBW/kg (Calculated) : 75.3  Temp (24hrs), Avg:98 F (36.7 C), Min:97.5 F (36.4 C), Max:98.6 F (37 C)   Recent Labs Lab 09/03/16 2237  09/05/16 0644 09/06/16 0549 09/07/16 0604 09/07/16 1434 09/09/16 0456 09/09/16 2115 09/10/16 0648  WBC  --   --  28.4*  --  16.2*  --  14.0* 15.0* 12.3*  CREATININE  --   < >  --  1.27* 1.16  --  1.23 1.34* 1.12  VANCOTROUGH 23*  --   --   --   --  17  --   --   --   < > = values in this interval not displayed.  Estimated Creatinine Clearance: 60.7 mL/min (by C-G formula based on SCr of 1.12 mg/dL).   Normalized CrCl 35mls/min  No Known Allergies  Antimicrobials this admission: Vancomycin 8/28 >>  Cefepime 9/6 >>  Zosyn 8/28>>9/5  Dose adjustments this admission: N/A  Microbiology results: 8/31  Foot cx: E.Faecalis s- amp and  vanc   K. Oxytoc s- cefepime, ceftriaxone R- Amp 8/20 MRSA PCR: neg  Thank you for allowing pharmacy to be a part of this patient's care.  Isac Sarna, BS Pharm D, [place] Clinical Pharmacist Pager [phone] 09/10/2016 8:49 AM

## 2016-09-10 NOTE — ED Notes (Signed)
Report given to Heather RN on 300. 

## 2016-09-10 NOTE — Progress Notes (Signed)
ANTIBIOTIC CONSULT NOTE-Preliminary  Pharmacy Consult for Cefepime Indication: Pneumonia  No Known Allergies  Patient Measurements: Height: 5\' 11"  (180.3 cm) Weight: 224 lb 10.4 oz (101.9 kg) IBW/kg (Calculated) : 75.3  Vital Signs: Temp: 98.6 F (37 C) (09/06 0056) Temp Source: Oral (09/06 0056) BP: 161/72 (09/06 0056) Pulse Rate: 90 (09/06 0056)  Labs:  Recent Labs  09/07/16 0604 09/09/16 0456 09/09/16 2115  WBC 16.2* 14.0* 15.0*  HGB 10.4* 9.6* 8.8*  PLT 324 301 299  CREATININE 1.16 1.23 1.34*    Estimated Creatinine Clearance: 50.7 mL/min (A) (by C-G formula based on SCr of 1.34 mg/dL (H)).   Recent Labs  09/07/16 1434  [place]     Microbiology: Recent Results (from the past 720 hour(s))  Culture, blood (routine x 2)     Status: None   Collection Time: 08/20/16  3:51 PM  Result Value Ref Range Status   Specimen Description RIGHT ANTECUBITAL  Final   Special Requests   Final    BOTTLES DRAWN AEROBIC AND ANAEROBIC Blood Culture results may not be optimal due to an inadequate volume of blood received in culture bottles   Culture NO GROWTH 5 DAYS  Final   Report Status 08/25/2016 FINAL  Final  Culture, blood (routine x 2)     Status: None   Collection Time: 08/20/16  3:51 PM  Result Value Ref Range Status   Specimen Description BLOOD RIGHT HAND  Final   Special Requests   Final    BOTTLES DRAWN AEROBIC AND ANAEROBIC Blood Culture adequate volume   Culture NO GROWTH 5 DAYS  Final   Report Status 08/25/2016 FINAL  Final  Blood culture (routine x 2)     Status: None   Collection Time: 09/01/16 11:00 AM  Result Value Ref Range Status   Specimen Description RIGHT ANTECUBITAL  Final   Special Requests   Final    BOTTLES DRAWN AEROBIC AND ANAEROBIC Blood Culture adequate volume   Culture NO GROWTH 5 DAYS  Final   Report Status 09/06/2016 FINAL  Final  Blood culture (routine x 2)     Status: None   Collection Time: 09/01/16 11:08 AM  Result Value  Ref Range Status   Specimen Description BLOOD RIGHT WRIST  Final   Special Requests   Final    BOTTLES DRAWN AEROBIC AND ANAEROBIC Blood Culture adequate volume   Culture NO GROWTH 5 DAYS  Final   Report Status 09/06/2016 FINAL  Final  Surgical pcr screen     Status: None   Collection Time: 09/03/16 11:33 AM  Result Value Ref Range Status   MRSA, PCR NEGATIVE NEGATIVE Final   Staphylococcus aureus NEGATIVE NEGATIVE Final    Comment: (NOTE) The Xpert SA Assay (FDA approved for NASAL specimens in patients 11 years of age and older), is one component of a comprehensive surveillance program. It is not intended to diagnose infection nor to guide or monitor treatment.   Aerobic/Anaerobic Culture (surgical/deep wound)     Status: None   Collection Time: 09/04/16 10:16 AM  Result Value Ref Range Status   Specimen Description FOOT LEFT  Final   Special Requests NONE  Final   Gram Stain   Final    NO WBC SEEN RARE SQUAMOUS EPITHELIAL CELLS PRESENT FEW GRAM POSITIVE COCCI IN PAIRS RARE GRAM NEGATIVE RODS    Culture   Final    ABUNDANT ENTEROCOCCUS FAECALIS FEW KLEBSIELLA OXYTOCA NO ANAEROBES ISOLATED Performed at [facility], [place]  [address] [place], [place] [address]    Report Status 09/09/2016 FINAL  Final   Organism ID, Bacteria ENTEROCOCCUS FAECALIS  Final   Organism ID, Bacteria KLEBSIELLA OXYTOCA  Final      Susceptibility   Enterococcus faecalis - MIC*    AMPICILLIN <=2 SENSITIVE Sensitive     VANCOMYCIN 1 SENSITIVE Sensitive     GENTAMICIN SYNERGY SENSITIVE Sensitive     * ABUNDANT ENTEROCOCCUS FAECALIS   Klebsiella oxytoca - MIC*    AMPICILLIN >=32 RESISTANT Resistant     CEFAZOLIN 8 SENSITIVE Sensitive     CEFEPIME <=1 SENSITIVE Sensitive     CEFTAZIDIME <=1 SENSITIVE Sensitive     CEFTRIAXONE <=1 SENSITIVE Sensitive     CIPROFLOXACIN <=0.25 SENSITIVE Sensitive     GENTAMICIN <=1 SENSITIVE Sensitive     IMIPENEM <=0.25 SENSITIVE Sensitive     TRIMETH/SULFA  <=20 SENSITIVE Sensitive     AMPICILLIN/SULBACTAM 16 INTERMEDIATE Intermediate     PIP/TAZO <=4 SENSITIVE Sensitive     Extended ESBL NEGATIVE Sensitive     * FEW KLEBSIELLA OXYTOCA    Medical History: Past Medical History:  Diagnosis Date  . BPH (benign prostatic hyperplasia)   . CAD (coronary artery disease)    Multivessel status post CABG 2011 - LIMA to LAD, SVG to diagonal, SVG to OM, SVG to PDA  . Cellulitis    12/15  . Chronic back pain   . Chronic diastolic CHF (congestive heart failure) ([place])   . CKD (chronic kidney disease), stage III   . COPD (chronic obstructive pulmonary disease) ([place])   . Essential hypertension   . Gastric mass    EGD 9/15  . GERD (gastroesophageal reflux disease)   . History of DVT (deep vein thrombosis)    Postphlebitic syndrome  . History of kidney stones   . HOH (hard of hearing)   . Hx of CABG   . Hyperlipidemia   . Persistent atrial fibrillation ([place])    a. s/p DCCV 03/2016.  [name] Sleep apnea    Stop Bang score of 5  . Type 2 diabetes mellitus ([facility])     Medications:   Assessment: Pt is an 83 yo male seen in the ED for SOB; s/p recent hospitalization for diabetic foot infection; discharged 9/04 to the [facility] for rehab and continuation of IV Vancomycin and Zosyn. Pt now to be admitted for suspected HCAP. Zosyn to be discontinued. Pharmacy has been consulted for Cefepime dosing.  Goal of Therapy:  Eradicate infection  Plan:  Preliminary review of pertinent patient information completed.  Protocol will be initiated with dose of Cefepime 2 Gm IV.  [place]st[place] Na clinical pharmacist will complete review during morning rounds to assess patient and finalize treatment regimen if needed.  Norberto Sorenson, [facility] 09/10/2016,2:40 AM

## 2016-09-10 NOTE — Care Management Obs Status (Signed)
[place] NOTIFICATION   Patient Details  Name: DUFF POZZI MRN: [id number] Date of Birth: Jun 14, 1931   Medicare Observation Status Notification Given:  Yes    Chancey Ringel, [place], RN 09/10/2016, 11:25 AM

## 2016-09-10 NOTE — Progress Notes (Signed)
Patient is sent back from SNF after 36 hours told he needs an IND of his foot the patient had an IND of his foot 2 days prior to discharge has been on multiple antibiotics for 10 days discharged on vancomycin and Zosyn to continue for an additional week repeat chest x-ray in ER shows a question of atelectasis infiltrate in left base despite being on 2 antibiotics and he is admitted for questionable healthcare associated pneumonia. The patient has no clinical stigmata of pneumonia at present his leukocytosis is diminished from previous week due to his foot cellulitis [name] WCH:[id number] DOB: 11/18/1931 DOA: 09/09/2016 PCP: [name], MD   Physical Exam: Blood pressure (!) 141/70, pulse 80, temperature 97.9 F (36.6 C), temperature source Oral, resp. rate 17, height 5\' 11"  (1.803 m), weight 101.9 kg (224 lb 10.4 oz), SpO2 94 %. Lungs diminished breath sounds in the bases no rales wheeze rhonchi appreciable prolonged x-ray phase scattered rhonchi no wheezes audible no rales audible heart irregular rhythm but no S3 auscultated no heaves thrills or rubs   Investigations:  Recent Results (from the past 240 hour(s))  Blood culture (routine x 2)     Status: None   Collection Time: 09/01/16 11:00 AM  Result Value Ref Range Status   Specimen Description RIGHT ANTECUBITAL  Final   Special Requests   Final    BOTTLES DRAWN AEROBIC AND ANAEROBIC Blood Culture adequate volume   Culture NO GROWTH 5 DAYS  Final   Report Status 09/06/2016 FINAL  Final  Blood culture (routine x 2)     Status: None   Collection Time: 09/01/16 11:08 AM  Result Value Ref Range Status   Specimen Description BLOOD RIGHT WRIST  Final   Special Requests   Final    BOTTLES DRAWN AEROBIC AND ANAEROBIC Blood Culture adequate volume   Culture NO GROWTH 5 DAYS  Final   Report Status 09/06/2016 FINAL  Final  Surgical pcr screen     Status: None   Collection Time: 09/03/16 11:33 AM  Result Value Ref Range Status    MRSA, PCR NEGATIVE NEGATIVE Final   Staphylococcus aureus NEGATIVE NEGATIVE Final    Comment: (NOTE) The Xpert SA Assay (FDA approved for NASAL specimens in patients 34 years of age and older), is one component of a comprehensive surveillance program. It is not intended to diagnose infection nor to guide or monitor treatment.   Aerobic/Anaerobic Culture (surgical/deep wound)     Status: None   Collection Time: 09/04/16 10:16 AM  Result Value Ref Range Status   Specimen Description FOOT LEFT  Final   Special Requests NONE  Final   Gram Stain   Final    NO WBC SEEN RARE SQUAMOUS EPITHELIAL CELLS PRESENT FEW GRAM POSITIVE COCCI IN PAIRS RARE GRAM NEGATIVE RODS    Culture   Final    ABUNDANT ENTEROCOCCUS FAECALIS FEW KLEBSIELLA OXYTOCA NO ANAEROBES ISOLATED Performed at [facility], [place] [address], [place], [place] [address]    Report Status 09/09/2016 FINAL  Final   Organism ID, Bacteria ENTEROCOCCUS FAECALIS  Final   Organism ID, Bacteria KLEBSIELLA OXYTOCA  Final      Susceptibility   Enterococcus faecalis - MIC*    AMPICILLIN <=2 SENSITIVE Sensitive     VANCOMYCIN 1 SENSITIVE Sensitive     GENTAMICIN SYNERGY SENSITIVE Sensitive     * ABUNDANT ENTEROCOCCUS FAECALIS   Klebsiella oxytoca - MIC*    AMPICILLIN >=32 RESISTANT Resistant  CEFAZOLIN 8 SENSITIVE Sensitive     CEFEPIME <=1 SENSITIVE Sensitive     CEFTAZIDIME <=1 SENSITIVE Sensitive     CEFTRIAXONE <=1 SENSITIVE Sensitive     CIPROFLOXACIN <=0.25 SENSITIVE Sensitive     GENTAMICIN <=1 SENSITIVE Sensitive     IMIPENEM <=0.25 SENSITIVE Sensitive     TRIMETH/SULFA <=20 SENSITIVE Sensitive     AMPICILLIN/SULBACTAM 16 INTERMEDIATE Intermediate     PIP/TAZO <=4 SENSITIVE Sensitive     Extended ESBL NEGATIVE Sensitive     * FEW KLEBSIELLA OXYTOCA     Basic Metabolic Panel:  Recent Labs  09/09/16 2115 09/10/16 0648  NA 134* 135  K 3.7 3.6  CL 96* 97*  CO2 31 33*  GLUCOSE 190* 154*  BUN 10 10   CREATININE 1.34* 1.12  CALCIUM 9.6 9.7   Liver Function Tests:  Recent Labs  09/10/16 0648  AST 16  ALT 22  ALKPHOS 142*  BILITOT 0.5  PROT 5.5*  ALBUMIN 2.2*     CBC:  Recent Labs  09/09/16 2115 09/10/16 0648  WBC 15.0* 12.3*  HGB 8.8* 8.5*  HCT 27.5* 26.3*  MCV 84.1 85.1  PLT 299 268    Dg Chest 2 View  Result Date: 09/09/2016 CLINICAL DATA:  Central chest pain on off for 4 weeks. History of CHF, diabetes, hypertension, coronary artery disease, gastroesophageal reflux disease, DVT, COPD, atrial fibrillation, chronic kidney disease, coronary artery bypass. EXAM: CHEST  2 VIEW COMPARISON:  09/01/2016 FINDINGS: Postoperative changes in the mediastinum. Cardiac enlargement without significant vascular congestion. Increasing interstitial pattern to the lung bases particularly on the left may indicate developing interstitial edema or pneumonia. Small left pleural effusion has developed since previous study. Atelectasis or infiltration in the left lung base is also developing. Calcification of the aorta. No pneumothorax. Right PICC line with tip over the low SVC region. Degenerative changes in the spine and shoulders. IMPRESSION: Cardiac enlargement. Developing consolidation or atelectasis in the left lung base with new small left pleural effusion and increasing bibasilar interstitial changes. Electronically Signed   By: Lucienne Capers M.D.   On: 09/09/2016 21:08      Medications:   Impression:  Active Problems:   HCAP (healthcare-associated pneumonia)   COPD (chronic obstructive pulmonary disease) (HCC)   CAD (coronary artery disease)   Hyperlipidemia     Plan: Pulmonary consult to help 9 on question of healthcare associated pneumonia versus atelectasis. Repeat surgical consult as the patient was told he requires another IND by a healthcare worker at SNF. Gastroenterology consult due to chronic disphagia  to some foods and question of whether or not aspiration is in  play?  Consultants: Repeat surgical consult. Pulmonary consult gastroenterology consult   Procedures   Antibiotics: Continue vancomycin now on Maxipime as opposed to Zosyn at skilled nursing facility          Time spent: 45 minutes   LOS: 0 days   [name] M   09/10/2016, 2:30 PM

## 2016-09-11 ENCOUNTER — Observation Stay (HOSPITAL_COMMUNITY): Payer: Medicare Other

## 2016-09-11 DIAGNOSIS — H9190 Unspecified hearing loss, unspecified ear: Secondary | ICD-10-CM | POA: Diagnosis present

## 2016-09-11 DIAGNOSIS — L02612 Cutaneous abscess of left foot: Secondary | ICD-10-CM | POA: Diagnosis present

## 2016-09-11 DIAGNOSIS — E1151 Type 2 diabetes mellitus with diabetic peripheral angiopathy without gangrene: Secondary | ICD-10-CM | POA: Diagnosis present

## 2016-09-11 DIAGNOSIS — E8809 Other disorders of plasma-protein metabolism, not elsewhere classified: Secondary | ICD-10-CM | POA: Diagnosis present

## 2016-09-11 DIAGNOSIS — R1314 Dysphagia, pharyngoesophageal phase: Secondary | ICD-10-CM | POA: Diagnosis present

## 2016-09-11 DIAGNOSIS — I481 Persistent atrial fibrillation: Secondary | ICD-10-CM | POA: Diagnosis present

## 2016-09-11 DIAGNOSIS — J440 Chronic obstructive pulmonary disease with acute lower respiratory infection: Secondary | ICD-10-CM | POA: Diagnosis present

## 2016-09-11 DIAGNOSIS — Y95 Nosocomial condition: Secondary | ICD-10-CM | POA: Diagnosis present

## 2016-09-11 DIAGNOSIS — E785 Hyperlipidemia, unspecified: Secondary | ICD-10-CM | POA: Diagnosis present

## 2016-09-11 DIAGNOSIS — E1122 Type 2 diabetes mellitus with diabetic chronic kidney disease: Secondary | ICD-10-CM | POA: Diagnosis present

## 2016-09-11 DIAGNOSIS — N183 Chronic kidney disease, stage 3 (moderate): Secondary | ICD-10-CM | POA: Diagnosis present

## 2016-09-11 DIAGNOSIS — R1310 Dysphagia, unspecified: Secondary | ICD-10-CM | POA: Diagnosis not present

## 2016-09-11 DIAGNOSIS — R079 Chest pain, unspecified: Secondary | ICD-10-CM | POA: Diagnosis present

## 2016-09-11 DIAGNOSIS — K219 Gastro-esophageal reflux disease without esophagitis: Secondary | ICD-10-CM | POA: Diagnosis present

## 2016-09-11 DIAGNOSIS — I5032 Chronic diastolic (congestive) heart failure: Secondary | ICD-10-CM | POA: Diagnosis present

## 2016-09-11 DIAGNOSIS — S91332A Puncture wound without foreign body, left foot, initial encounter: Secondary | ICD-10-CM | POA: Diagnosis present

## 2016-09-11 DIAGNOSIS — D175 Benign lipomatous neoplasm of intra-abdominal organs: Secondary | ICD-10-CM | POA: Diagnosis present

## 2016-09-11 DIAGNOSIS — G4730 Sleep apnea, unspecified: Secondary | ICD-10-CM | POA: Diagnosis present

## 2016-09-11 DIAGNOSIS — Z951 Presence of aortocoronary bypass graft: Secondary | ICD-10-CM | POA: Diagnosis not present

## 2016-09-11 DIAGNOSIS — L03116 Cellulitis of left lower limb: Secondary | ICD-10-CM | POA: Diagnosis present

## 2016-09-11 DIAGNOSIS — I2510 Atherosclerotic heart disease of native coronary artery without angina pectoris: Secondary | ICD-10-CM | POA: Diagnosis present

## 2016-09-11 DIAGNOSIS — D649 Anemia, unspecified: Secondary | ICD-10-CM | POA: Diagnosis present

## 2016-09-11 DIAGNOSIS — W450XXA Nail entering through skin, initial encounter: Secondary | ICD-10-CM | POA: Diagnosis not present

## 2016-09-11 DIAGNOSIS — I130 Hypertensive heart and chronic kidney disease with heart failure and stage 1 through stage 4 chronic kidney disease, or unspecified chronic kidney disease: Secondary | ICD-10-CM | POA: Diagnosis present

## 2016-09-11 DIAGNOSIS — T797XXA Traumatic subcutaneous emphysema, initial encounter: Secondary | ICD-10-CM | POA: Diagnosis present

## 2016-09-11 DIAGNOSIS — I482 Chronic atrial fibrillation: Secondary | ICD-10-CM | POA: Diagnosis present

## 2016-09-11 DIAGNOSIS — L03032 Cellulitis of left toe: Secondary | ICD-10-CM | POA: Diagnosis not present

## 2016-09-11 DIAGNOSIS — J189 Pneumonia, unspecified organism: Secondary | ICD-10-CM | POA: Diagnosis present

## 2016-09-11 DIAGNOSIS — J9611 Chronic respiratory failure with hypoxia: Secondary | ICD-10-CM | POA: Diagnosis present

## 2016-09-11 DIAGNOSIS — J441 Chronic obstructive pulmonary disease with (acute) exacerbation: Secondary | ICD-10-CM | POA: Diagnosis not present

## 2016-09-11 LAB — BASIC METABOLIC PANEL
ANION GAP: 4 — AB (ref 5–15)
BUN: 11 mg/dL (ref 6–20)
CO2: 33 mmol/L — ABNORMAL HIGH (ref 22–32)
Calcium: 9.7 mg/dL (ref 8.9–10.3)
Chloride: 98 mmol/L — ABNORMAL LOW (ref 101–111)
Creatinine, Ser: 1.11 mg/dL (ref 0.61–1.24)
GFR, EST NON AFRICAN AMERICAN: 59 mL/min — AB (ref >60)
GLUCOSE: 170 mg/dL — AB (ref 65–99)
POTASSIUM: 3.7 mmol/L (ref 3.5–5.1)
Sodium: 135 mmol/L (ref 135–145)

## 2016-09-11 LAB — VANCOMYCIN, TROUGH: VANCOMYCIN TR: 37 ug/mL — AB (ref 15–20)

## 2016-09-11 MED ORDER — POVIDONE-IODINE 10 % EX SOLN
CUTANEOUS | Status: AC
  Filled 2016-09-11: qty 118

## 2016-09-11 MED ORDER — POVIDONE-IODINE 10 % EX SOLN
Freq: Two times a day (BID) | CUTANEOUS | Status: DC
  Administered 2016-09-11: 1 via TOPICAL

## 2016-09-11 MED ORDER — IOPAMIDOL (ISOVUE-300) INJECTION 61%
80.0000 mL | Freq: Once | INTRAVENOUS | Status: AC | PRN
  Administered 2016-09-11: 75 mL via INTRAVENOUS

## 2016-09-11 MED ORDER — PIPERACILLIN-TAZOBACTAM 3.375 G IVPB
3.3750 g | Freq: Three times a day (TID) | INTRAVENOUS | Status: DC
  Administered 2016-09-11 – 2016-09-15 (×13): 3.375 g via INTRAVENOUS
  Filled 2016-09-11 (×13): qty 50

## 2016-09-11 MED ORDER — CLOTRIMAZOLE 1 % EX CREA
TOPICAL_CREAM | Freq: Every morning | CUTANEOUS | Status: DC
  Administered 2016-09-11 – 2016-09-12 (×2): via TOPICAL
  Administered 2016-09-13: 1 via TOPICAL
  Administered 2016-09-14 – 2016-09-15 (×2): via TOPICAL
  Filled 2016-09-11: qty 15

## 2016-09-11 NOTE — Progress Notes (Signed)
Performed betadine soak on pt left foot. Wound assessment was pink and yellow within the wound, with what seemed like inside foot cartilage hanging out. Almost looked like pus, yet it was a thicker consistency and didn't drain or cleanse away. Peri- wound assessment was pink and painful. Dressing re-applied, will continue to monitor.

## 2016-09-11 NOTE — Progress Notes (Signed)
Readmission noted. Will follow with you. Wound care orders written.

## 2016-09-11 NOTE — Evaluation (Signed)
Physical Therapy Evaluation Patient Details Name: [name] MRN: [id number] DOB: [birth year] Today's Date: 09/11/2016   History of Present Illness  [name] is an 83 y.o. male just discharge yesterday to SNF, having had I and D of left foot, PICC line and currently on IV Van/Zosyn, hx of HTN, HLD, CKD, brought to the ER as he has bleeding on his right dorsum of the hand and some chest pain with coughing.   He has ben on Eliquis.  The bleeding was stopped with local pressure.  CXR however showed new consolidation,  WBC at 15K, and Cr of 1.4.  Hospitalist was asked to readmit him for new developing PNA and to watch for any further bleeding.     Clinical Impression  Patient demonstrates slow labored functional mobility and limited taking steps due to fatigue.  Patient tolerated sitting up in chair after therapy - nursing staff notified.  Patient will benefit from continued physical therapy in hospital and recommended venue below to increase strength, balance, endurance for safe ADLs and gait.    Follow Up Recommendations SNF    Equipment Recommendations  Wheelchair (measurements PT)    Recommendations for Other Services       Precautions / Restrictions Precautions Precautions: Fall Precaution Comments: left plantar surface forefoot wound Restrictions Weight Bearing Restrictions: Yes Other Position/Activity Restrictions: heel weight bearing on LLE      Mobility  Bed Mobility Overal bed mobility: Needs Assistance Bed Mobility: Supine to Sit;Sit to Supine     Supine to sit: Min guard Sit to supine: Min guard      Transfers Overall transfer level: Needs assistance Equipment used: Rolling walker (2 wheeled) Transfers: Sit to/from Stand Sit to Stand: Mod assist Stand pivot transfers: Min assist          Ambulation/Gait Ambulation/Gait assistance: Mod assist Ambulation Distance (Feet): 4 Feet Assistive device: Rolling walker (2 wheeled) Gait Pattern/deviations:  Decreased stance time - right;Decreased step length - left;Decreased step length - right;Decreased stance time - left;Decreased stride length   Gait velocity interpretation: Below normal speed for age/gender General Gait Details: limited to a few steps for transfer due to c/o fatigue  Stairs            Wheelchair Mobility    Modified Rankin (Stroke Patients Only)       Balance Overall balance assessment: Needs assistance Sitting-balance support: No upper extremity supported;Feet supported Sitting balance-Leahy Scale: Good     Standing balance support: Bilateral upper extremity supported;During functional activity Standing balance-Leahy Scale: Fair Standing balance comment: fair with NWB on left fore foot                             Pertinent Vitals/Pain Pain Assessment: No/denies pain    Home Living Family/patient expects to be discharged to:: Private residence Living Arrangements: Alone Available Help at Discharge: Family Type of Home: Mobile home Home Access: Stairs to enter Entrance Stairs-Rails: Can reach both Entrance Stairs-Number of Steps: 3 Home Layout: One level Home Equipment: None      Prior Function Level of Independence: Independent         Comments: independent with ADLs, IADLs, ambulation without AD or assistance, still driving     Hand Dominance   Dominant Hand: Right    Extremity/Trunk Assessment   Upper Extremity Assessment Upper Extremity Assessment: Overall WFL for tasks assessed    Lower Extremity Assessment Lower Extremity Assessment: Generalized weakness;LLE deficits/detail  LLE Deficits / Details: 3+/5    Cervical / Trunk Assessment Cervical / Trunk Assessment: Normal  Communication   Communication: No difficulties  Cognition Arousal/Alertness: Awake/alert Behavior During Therapy: WFL for tasks assessed/performed Overall Cognitive Status: Within Functional Limits for tasks assessed                                         General Comments      Exercises     Assessment/Plan    PT Assessment Patient needs continued PT services  PT Problem List Decreased strength;Decreased activity tolerance;Decreased balance;Decreased mobility       PT Treatment Interventions Gait training;Stair training;Functional mobility training;Therapeutic activities;Therapeutic exercise;Patient/family education    PT Goals (Current goals can be found in the Care Plan section)  Acute Rehab PT Goals Patient Stated Goal: return home after rehab PT Goal Formulation: With patient Time For Goal Achievement: 09/18/16 Potential to Achieve Goals: Good    Frequency Min 3X/week   Barriers to discharge        Co-evaluation               AM-PAC PT "6 Clicks" Daily Activity  Outcome Measure Difficulty turning over in bed (including adjusting bedclothes, sheets and blankets)?: Unable Difficulty moving from lying on back to sitting on the side of the bed? : Unable Difficulty sitting down on and standing up from a chair with arms (e.g., wheelchair, bedside commode, etc,.)?: Unable Help needed moving to and from a bed to chair (including a wheelchair)?: A Little Help needed walking in hospital room?: A Little Help needed climbing 3-5 steps with a railing? : A Lot 6 Click Score: 11    End of Session Equipment Utilized During Treatment: Oxygen Activity Tolerance: Patient limited by fatigue;No increased pain Patient left: in chair;with call bell/phone within reach Nurse Communication: Mobility status PT Visit Diagnosis: Unsteadiness on feet (R26.81);Other abnormalities of gait and mobility (R26.89);Muscle weakness (generalized) (M62.81)    Time: 1829-9371 PT Time Calculation (min) (ACUTE ONLY): 37 min   Charges:   PT Evaluation $PT Eval Low Complexity: 1 Low PT Treatments $Gait Training: 23-37 mins   PT G Codes:   PT G-Codes **NOT FOR INPATIENT CLASS** Functional Assessment Tool Used:  AM-PAC 6 Clicks Basic Mobility Functional Limitation: Mobility: Walking and moving around Mobility: Walking and Moving Around Current Status ([id number]): At least 60 percent but less than 80 percent impaired, limited or restricted Mobility: Walking and Moving Around Goal Status [phone]): At least 60 percent but less than 80 percent impaired, limited or restricted Mobility: Walking and Moving Around Discharge Status [phone]): At least 60 percent but less than 80 percent impaired, limited or restricted     3:45 PM, 09/11/16 Lonell Grandchild, MPT Physical Therapist with [facility] 336 [phone] office [phone] mobile phone

## 2016-09-11 NOTE — Progress Notes (Signed)
Pt Vancomycin was started at 1722, paused shortly after as lab came in. Vanc then restarted and stopped once trough level came back 37. According to care order, stop medication if >20. Will continue to monitor.

## 2016-09-11 NOTE — Progress Notes (Signed)
Patient sent to skilled nursing facility for a period of 36 hours after IND of foot for 7 days additionally vancomycin and Zosyn he was sent back for unknown reasons however chest x-ray reveals left lower lobe atelectasis/infiltrate which may be new despite being on 10 days of multiple antibiotics while in hospital he is admitted for healthcare acquired pneumonia pulmonology consult is pending likewise repeat surgical consult on left foot is pending and gastroenterology consult regarding despite she is status post [place]oney dilatation of his esophagus 3-4 months prior to admission the concern is whether or not there is any benefit of aspiration due to impaired swallowing mechanism? Patient continues on vancomycin however has been switched to cefepime [name] QQP:[id number] DOB: April 05, 1931 DOA: 09/09/2016 PCP: [name], MD   Physical Exam: Blood pressure (!) 143/81, pulse (!) 55, temperature 98.6 F (37 C), temperature source Oral, resp. rate 17, height 5\' 11"  (1.803 m), weight 101.9 kg (224 lb 10.4 oz), SpO2 100 %. Lungs reveal no evidence of rales bilaterally scattered coarse rhonchi bilaterally no wheezes audible this morning heart irregular rhythm no no S3 auscultated no heaves thrills rubs   Investigations:  Recent Results (from the past 240 hour(s))  Blood culture (routine x 2)     Status: None   Collection Time: 09/01/16 11:00 AM  Result Value Ref Range Status   Specimen Description RIGHT ANTECUBITAL  Final   Special Requests   Final    BOTTLES DRAWN AEROBIC AND ANAEROBIC Blood Culture adequate volume   Culture NO GROWTH 5 DAYS  Final   Report Status 09/06/2016 FINAL  Final  Blood culture (routine x 2)     Status: None   Collection Time: 09/01/16 11:08 AM  Result Value Ref Range Status   Specimen Description BLOOD RIGHT WRIST  Final   Special Requests   Final    BOTTLES DRAWN AEROBIC AND ANAEROBIC Blood Culture adequate volume   Culture NO GROWTH 5 DAYS  Final   Report  Status 09/06/2016 FINAL  Final  Surgical pcr screen     Status: None   Collection Time: 09/03/16 11:33 AM  Result Value Ref Range Status   MRSA, PCR NEGATIVE NEGATIVE Final   Staphylococcus aureus NEGATIVE NEGATIVE Final    Comment: (NOTE) The Xpert SA Assay (FDA approved for NASAL specimens in patients 64 years of age and older), is one component of a comprehensive surveillance program. It is not intended to diagnose infection nor to guide or monitor treatment.   Aerobic/Anaerobic Culture (surgical/deep wound)     Status: None   Collection Time: 09/04/16 10:16 AM  Result Value Ref Range Status   Specimen Description FOOT LEFT  Final   Special Requests NONE  Final   Gram Stain   Final    NO WBC SEEN RARE SQUAMOUS EPITHELIAL CELLS PRESENT FEW GRAM POSITIVE COCCI IN PAIRS RARE GRAM NEGATIVE RODS    Culture   Final    ABUNDANT ENTEROCOCCUS FAECALIS FEW KLEBSIELLA OXYTOCA NO ANAEROBES ISOLATED Performed at [facility], [place] [address] [place], [place] [address]    Report Status 09/09/2016 FINAL  Final   Organism ID, Bacteria ENTEROCOCCUS FAECALIS  Final   Organism ID, Bacteria KLEBSIELLA OXYTOCA  Final      Susceptibility   Enterococcus faecalis - MIC*    AMPICILLIN <=2 SENSITIVE Sensitive     VANCOMYCIN 1 SENSITIVE Sensitive     GENTAMICIN SYNERGY SENSITIVE Sensitive     * ABUNDANT ENTEROCOCCUS FAECALIS   Klebsiella oxytoca -  MIC*    AMPICILLIN >=32 RESISTANT Resistant     CEFAZOLIN 8 SENSITIVE Sensitive     CEFEPIME <=1 SENSITIVE Sensitive     CEFTAZIDIME <=1 SENSITIVE Sensitive     CEFTRIAXONE <=1 SENSITIVE Sensitive     CIPROFLOXACIN <=0.25 SENSITIVE Sensitive     GENTAMICIN <=1 SENSITIVE Sensitive     IMIPENEM <=0.25 SENSITIVE Sensitive     TRIMETH/SULFA <=20 SENSITIVE Sensitive     AMPICILLIN/SULBACTAM 16 INTERMEDIATE Intermediate     PIP/TAZO <=4 SENSITIVE Sensitive     Extended ESBL NEGATIVE Sensitive     * FEW KLEBSIELLA OXYTOCA     Basic Metabolic  Panel:  Recent Labs  09/09/16 2115 09/10/16 0648  NA 134* 135  K 3.7 3.6  CL 96* 97*  CO2 31 33*  GLUCOSE 190* 154*  BUN 10 10  CREATININE 1.34* 1.12  CALCIUM 9.6 9.7   Liver Function Tests:  Recent Labs  09/10/16 0648  AST 16  ALT 22  ALKPHOS 142*  BILITOT 0.5  PROT 5.5*  ALBUMIN 2.2*     CBC:  Recent Labs  09/10/16 0648 09/10/16 1514  WBC 12.3* 14.6*  NEUTROABS  --  11.8*  HGB 8.5* 8.7*  HCT 26.3* 26.8*  MCV 85.1 84.5  PLT 268 289    Dg Chest 2 View  Result Date: 09/09/2016 CLINICAL DATA:  Central chest pain on off for 4 weeks. History of CHF, diabetes, hypertension, coronary artery disease, gastroesophageal reflux disease, DVT, COPD, atrial fibrillation, chronic kidney disease, coronary artery bypass. EXAM: CHEST  2 VIEW COMPARISON:  09/01/2016 FINDINGS: Postoperative changes in the mediastinum. Cardiac enlargement without significant vascular congestion. Increasing interstitial pattern to the lung bases particularly on the left may indicate developing interstitial edema or pneumonia. Small left pleural effusion has developed since previous study. Atelectasis or infiltration in the left lung base is also developing. Calcification of the aorta. No pneumothorax. Right PICC line with tip over the low SVC region. Degenerative changes in the spine and shoulders. IMPRESSION: Cardiac enlargement. Developing consolidation or atelectasis in the left lung base with new small left pleural effusion and increasing bibasilar interstitial changes. Electronically Signed   By: Lucienne Capers M.D.   On: 09/09/2016 21:08      Medication  Impression:  Active Problems:   HCAP (healthcare-associated pneumonia)   COPD (chronic obstructive pulmonary disease) (HCC)   CAD (coronary artery disease)   Hyperlipidemia     Plan: Continue vancomycin and cefepime for questionable H CAP as well as cellulitis of left foot which is had a previous IND. Monitor renal function. Repeat  general surgery consult regarding assessment of cellulitis and its improvement as patient was told he needs a repeat IND by a healthcare provider to skilled nursing facility which has created out in patients of mine. Request gastroenterology consult regarding esophageal dilatation and despite she and whether (contributing to aspiration on a chronic basis?   Consultants: Physical therapy, general surgery, pulmonology, gastroenterology   Procedures status post IND of foot 5 days prior   Antibiotics: Vancomycin and cefepime          Time spent: 40 minutes   LOS: 0 days   Jamila Slatten M   09/11/2016, 6:47 AM

## 2016-09-11 NOTE — Consult Note (Signed)
Consult requested by: Dr. Lorriane Shire Consult requested for pneumonia  HPI: This is an 83 year old who has a known history of COPD coronary disease chronic back pain chronic diastolic heart failure chronic kidney disease hypertension difficulty swallowing atrial fib sleep apnea and diabetes. He said that he was out in his shop and he stepped on a bolt that went into his left foot. He developed an infection of the foot and he had been being treated with intravenous antibiotics. He has been transferred to a skilled care facility to finish the intravenous antibiotics and was doing fairly well there when he developed cough congestion and chest pain. He was brought back to the emergency department and was found to have pneumonia. He has significant swallowing issues at baseline. He says his pain is better. He is still coughing. His pain was mostly pleuritic. He's not had any chills. His foot feels better. He is less short of breath than he was. He's not had any nausea vomiting or diarrhea. No headache.  Past Medical History:  Diagnosis Date  . BPH (benign prostatic hyperplasia)   . CAD (coronary artery disease)    Multivessel status post CABG 2011 - LIMA to LAD, SVG to diagonal, SVG to OM, SVG to PDA  . Cellulitis    12/15  . Chronic back pain   . Chronic diastolic CHF (congestive heart failure) ([place])   . CKD (chronic kidney disease), stage III   . COPD (chronic obstructive pulmonary disease) ([place])   . Essential hypertension   . Gastric mass    EGD 9/15  . GERD (gastroesophageal reflux disease)   . History of DVT (deep vein thrombosis)    Postphlebitic syndrome  . History of kidney stones   . HOH (hard of hearing)   . Hx of CABG   . Hyperlipidemia   . Persistent atrial fibrillation ([place])    a. s/p DCCV 03/2016.  [name] Sleep apnea    Stop Bang score of 5  . Type 2 diabetes mellitus (HCC)      Family History  Problem Relation Age of Onset  . Colon cancer Son 66       deceased    No definite  family history of lung disease Social History   Social History  . Marital status: Single    Spouse name: N/A  . Number of children: N/A  . Years of education: N/A   Occupational History  . Retired    Social History Main Topics  . Smoking status: Former Smoker    Packs/day: 1.00    Years: 11.00    Quit date: 01/05/1956  . Smokeless tobacco: Never Used  . Alcohol use No  . Drug use: No  . Sexual activity: No   Other Topics Concern  . None   Social History Narrative   ** Merged History Encounter **         ROS: Except as mentioned 10 point review of systems is negative    Objective: Vital signs in last 24 hours: Temp:  [98.6 F (37 C)] 98.6 F (37 C) (09/06 1500) Pulse Rate:  [55-94] 79 (09/07 0833) Resp:  [17-19] 17 (09/07 0552) BP: (141-163)/(52-81) 158/73 (09/07 0833) SpO2:  [94 %-100 %] 99 % (09/07 0731) Weight change:  Last BM Date: 09/09/16  Intake/Output from previous day: 09/06 0701 - 09/07 0700 In: 960 [P.O.:960] Out: 1425 [Urine:1425]  PHYSICAL EXAM Constitutional: He is awake and alert and in no acute distress. Eyes: Pupils react EOMI. Ears nose mouth  and throat: Mucous membranes are moist. He is hard of hearing. Cardiovascular: His heart is irregularly irregular but I don't hear a gallop. Respiratory: His respiratory effort is normal. He has some scattered rhonchi bilaterally. Skin: His left leg is in a boot. Musculoskeletal: His left leg is in a boot. Strength appears normal. Gastrointestinal: His abdomen is soft without masses. Neurological: Aside from his hearing deficit no focal abnormalities. Psychiatric: Normal mood and affect.  Lab Results: Basic Metabolic Panel:  Recent Labs  09/10/16 0648 09/11/16 0611  NA 135 135  K 3.6 3.7  CL 97* 98*  CO2 33* 33*  GLUCOSE 154* 170*  BUN 10 11  CREATININE 1.12 1.11  CALCIUM 9.7 9.7   Liver Function Tests:  Recent Labs  09/10/16 0648  AST 16  ALT 22  ALKPHOS 142*  BILITOT 0.5  PROT 5.5*   ALBUMIN 2.2*   No results for input(s): LIPASE, AMYLASE in the last 72 hours. No results for input(s): AMMONIA in the last 72 hours. CBC:  Recent Labs  09/10/16 0648 09/10/16 1514  WBC 12.3* 14.6*  NEUTROABS  --  11.8*  HGB 8.5* 8.7*  HCT 26.3* 26.8*  MCV 85.1 84.5  PLT 268 289   Cardiac Enzymes:  Recent Labs  09/10/16 0155 09/10/16 0648 09/10/16 1319  TROPONINI <0.03 <0.03 <0.03   BNP: No results for input(s): PROBNP in the last 72 hours. D-Dimer: No results for input(s): DDIMER in the last 72 hours. CBG:  Recent Labs  09/08/16 2031 09/09/16 0857 09/10/16 0122 09/10/16 0730 09/10/16 1113 09/10/16 1625  GLUCAP 231* 158* 189* 162* 179* 164*   Hemoglobin A1C: No results for input(s): HGBA1C in the last 72 hours. Fasting Lipid Panel: No results for input(s): CHOL, HDL, LDLCALC, TRIG, CHOLHDL, LDLDIRECT in the last 72 hours. Thyroid Function Tests:  Recent Labs  09/09/16 2115  TSH 1.721   Anemia Panel: No results for input(s): VITAMINB12, FOLATE, FERRITIN, TIBC, IRON, RETICCTPCT in the last 72 hours. Coagulation: No results for input(s): LABPROT, INR in the last 72 hours. Urine Drug Screen: Drugs of Abuse  No results found for: LABOPIA, COCAINSCRNUR, LABBENZ, AMPHETMU, THCU, LABBARB  Alcohol Level: No results for input(s): ETH in the last 72 hours. Urinalysis: No results for input(s): COLORURINE, LABSPEC, PHURINE, GLUCOSEU, HGBUR, BILIRUBINUR, KETONESUR, PROTEINUR, UROBILINOGEN, NITRITE, LEUKOCYTESUR in the last 72 hours.  Invalid input(s): APPERANCEUR Misc. Labs:   ABGS: No results for input(s): PHART, PO2ART, TCO2, HCO3 in the last 72 hours.  Invalid input(s): PCO2   MICROBIOLOGY: Recent Results (from the past 240 hour(s))  Blood culture (routine x 2)     Status: None   Collection Time: 09/01/16 11:00 AM  Result Value Ref Range Status   Specimen Description RIGHT ANTECUBITAL  Final   Special Requests   Final    BOTTLES DRAWN AEROBIC  AND ANAEROBIC Blood Culture adequate volume   Culture NO GROWTH 5 DAYS  Final   Report Status 09/06/2016 FINAL  Final  Blood culture (routine x 2)     Status: None   Collection Time: 09/01/16 11:08 AM  Result Value Ref Range Status   Specimen Description BLOOD RIGHT WRIST  Final   Special Requests   Final    BOTTLES DRAWN AEROBIC AND ANAEROBIC Blood Culture adequate volume   Culture NO GROWTH 5 DAYS  Final   Report Status 09/06/2016 FINAL  Final  Surgical pcr screen     Status: None   Collection Time: 09/03/16 11:33 AM  Result Value Ref  Range Status   MRSA, PCR NEGATIVE NEGATIVE Final   Staphylococcus aureus NEGATIVE NEGATIVE Final    Comment: (NOTE) The Xpert SA Assay (FDA approved for NASAL specimens in patients 80 years of age and older), is one component of a comprehensive surveillance program. It is not intended to diagnose infection nor to guide or monitor treatment.   Aerobic/Anaerobic Culture (surgical/deep wound)     Status: None   Collection Time: 09/04/16 10:16 AM  Result Value Ref Range Status   Specimen Description FOOT LEFT  Final   Special Requests NONE  Final   Gram Stain   Final    NO WBC SEEN RARE SQUAMOUS EPITHELIAL CELLS PRESENT FEW GRAM POSITIVE COCCI IN PAIRS RARE GRAM NEGATIVE RODS    Culture   Final    ABUNDANT ENTEROCOCCUS FAECALIS FEW KLEBSIELLA OXYTOCA NO ANAEROBES ISOLATED Performed at [facility], [place] [address], [place], [place] [address]    Report Status 09/09/2016 FINAL  Final   Organism ID, Bacteria ENTEROCOCCUS FAECALIS  Final   Organism ID, Bacteria KLEBSIELLA OXYTOCA  Final      Susceptibility   Enterococcus faecalis - MIC*    AMPICILLIN <=2 SENSITIVE Sensitive     VANCOMYCIN 1 SENSITIVE Sensitive     GENTAMICIN SYNERGY SENSITIVE Sensitive     * ABUNDANT ENTEROCOCCUS FAECALIS   Klebsiella oxytoca - MIC*    AMPICILLIN >=32 RESISTANT Resistant     CEFAZOLIN 8 SENSITIVE Sensitive     CEFEPIME <=1 SENSITIVE Sensitive      CEFTAZIDIME <=1 SENSITIVE Sensitive     CEFTRIAXONE <=1 SENSITIVE Sensitive     CIPROFLOXACIN <=0.25 SENSITIVE Sensitive     GENTAMICIN <=1 SENSITIVE Sensitive     IMIPENEM <=0.25 SENSITIVE Sensitive     TRIMETH/SULFA <=20 SENSITIVE Sensitive     AMPICILLIN/SULBACTAM 16 INTERMEDIATE Intermediate     PIP/TAZO <=4 SENSITIVE Sensitive     Extended ESBL NEGATIVE Sensitive     * FEW KLEBSIELLA OXYTOCA    Studies/Results: Dg Chest 2 View  Result Date: 09/09/2016 CLINICAL DATA:  Central chest pain on off for 4 weeks. History of CHF, diabetes, hypertension, coronary artery disease, gastroesophageal reflux disease, DVT, COPD, atrial fibrillation, chronic kidney disease, coronary artery bypass. EXAM: CHEST  2 VIEW COMPARISON:  09/01/2016 FINDINGS: Postoperative changes in the mediastinum. Cardiac enlargement without significant vascular congestion. Increasing interstitial pattern to the lung bases particularly on the left may indicate developing interstitial edema or pneumonia. Small left pleural effusion has developed since previous study. Atelectasis or infiltration in the left lung base is also developing. Calcification of the aorta. No pneumothorax. Right PICC line with tip over the low SVC region. Degenerative changes in the spine and shoulders. IMPRESSION: Cardiac enlargement. Developing consolidation or atelectasis in the left lung base with new small left pleural effusion and increasing bibasilar interstitial changes. Electronically Signed   By: Lucienne Capers M.D.   On: 09/09/2016 21:08    Medications:  Prior to Admission:  Prescriptions Prior to Admission  Medication Sig Dispense Refill Last Dose  . apixaban (ELIQUIS) 5 MG TABS tablet Take 5 mg by mouth 2 (two) times daily.   09/09/2016 at 1800  . cloNIDine (CATAPRES) 0.1 MG tablet Take 0.1 mg by mouth 2 (two) times daily.   unknown  . docusate sodium (COLACE) 100 MG capsule Take 100 mg by mouth 2 (two) times daily.   unknown  . FLUoxetine  (PROZAC) 20 MG/5ML solution Take 5 mLs (20 mg total) by mouth daily. [place]  mL 3 unknown  . insulin glargine (LANTUS) 100 UNIT/ML injection Inject 0.3 mLs (30 Units total) into the skin at bedtime. 10 mL 11 unknown  . ipratropium-albuterol (DUONEB) 0.5-2.5 (3) MG/3ML SOLN Take 3 mLs by nebulization every 6 (six) hours as needed (wheezing).   unknown  . LORazepam (ATIVAN) 1 MG tablet Take 1 tablet (1 mg total) by mouth at bedtime. 30 tablet 0 unknown  . mupirocin cream (BACTROBAN) 2 % Apply topically 2 (two) times daily. 15 g 0 unknown  . nitroGLYCERIN (NITROSTAT) 0.4 MG SL tablet Place 1 tablet (0.4 mg total) under the tongue every 5 (five) minutes as needed for chest pain. 30 tablet 12 unknown  . oxyCODONE (ROXICODONE) 15 MG immediate release tablet Take 30 mg by mouth every 8 (eight) hours as needed for pain.    unknown  . piperacillin-tazobactam (ZOSYN) IVPB Inject 3.375 g into the vein every 8 (eight) hours. Indication:  cellulitis Last Day of Therapy:  9/12//18 Labs - Once weekly:  CBC/D and BMP, Labs - Every other week:  ESR and CRP 120 Units 0 09/09/2016 at Unknown time  . povidone-iodine (BETADINE) 10 % external solution Apply topically as needed for wound care. (Patient taking differently: Apply topically 2 (two) times daily. Soaks left foot) 480 mL 0 09/09/2016 at Unknown time  . rosuvastatin (CRESTOR) 10 MG tablet Take 10 mg by mouth daily.   unknown  . vancomycin IVPB Inject 1,000 mg into the vein daily. Indication:  cellulitis Last Day of Therapy:  09/16/16 Labs - Sunday/Monday:  CBC/D, BMP, and vancomycin trough. Labs - Thursday:  BMP and vancomycin trough Labs - Every other week:  ESR and CRP 7000 Units 0 09/09/2016 at Unknown time  . metoprolol succinate (TOPROL-XL) 25 MG 24 hr tablet Take 3 tablets (75 mg total) by mouth daily. Take with or immediately following a meal. (Patient not taking: Reported on 09/09/2016) 90 tablet 4 Not Taking at Unknown time   Scheduled: . apixaban  5 mg Oral BID   . cloNIDine  0.1 mg Oral BID  . docusate sodium  100 mg Oral BID  . FLUoxetine  20 mg Oral Daily  . insulin glargine  30 Units Subcutaneous QHS  . ipratropium-albuterol  3 mL Nebulization TID  . LORazepam  1 mg Oral QHS  . mouth rinse  15 mL Mouth Rinse BID  . metoprolol succinate  75 mg Oral Daily  . mupirocin cream   Topical BID  . povidone-iodine   Topical BID  . rosuvastatin  10 mg Oral Daily  . sodium chloride flush  3 mL Intravenous Q12H   Continuous: . piperacillin-tazobactam (ZOSYN)  IV 3.375 g (09/11/16 0841)  . vancomycin Stopped (09/10/16 1818)   YHT:[id number], nitroGLYCERIN, oxyCODONE  Assesment:He has healthcare associated pneumonia. I think this is very likely aspiration so I have switched him back to Zosyn. Because her some uncertainty about exactly what's going on I'm going to have him do CT of the chest to see if we can further delineate the infiltrate. Otherwise I think he is on appropriate treatments. Active Problems:   HCAP (healthcare-associated pneumonia)   COPD (chronic obstructive pulmonary disease) (HCC)   CAD (coronary artery disease)   Hyperlipidemia    Plan: As above    LOS: 0 days   Bonni Neuser L 09/11/2016, 9:04 AM

## 2016-09-12 ENCOUNTER — Inpatient Hospital Stay (HOSPITAL_COMMUNITY): Payer: Medicare Other

## 2016-09-12 DIAGNOSIS — L03119 Cellulitis of unspecified part of limb: Secondary | ICD-10-CM

## 2016-09-12 DIAGNOSIS — L03032 Cellulitis of left toe: Secondary | ICD-10-CM

## 2016-09-12 DIAGNOSIS — R1310 Dysphagia, unspecified: Secondary | ICD-10-CM

## 2016-09-12 DIAGNOSIS — J441 Chronic obstructive pulmonary disease with (acute) exacerbation: Secondary | ICD-10-CM

## 2016-09-12 DIAGNOSIS — R079 Chest pain, unspecified: Secondary | ICD-10-CM

## 2016-09-12 DIAGNOSIS — L02619 Cutaneous abscess of unspecified foot: Secondary | ICD-10-CM

## 2016-09-12 DIAGNOSIS — L02612 Cutaneous abscess of left foot: Secondary | ICD-10-CM

## 2016-09-12 LAB — CBC WITH DIFFERENTIAL/PLATELET
Basophils Absolute: 0.1 10*3/uL (ref 0.0–0.1)
Basophils Relative: 0 %
EOS ABS: 0.7 10*3/uL (ref 0.0–0.7)
Eosinophils Relative: 6 %
HCT: 26.1 % — ABNORMAL LOW (ref 39.0–52.0)
HEMOGLOBIN: 8.3 g/dL — AB (ref 13.0–17.0)
LYMPHS ABS: 1 10*3/uL (ref 0.7–4.0)
Lymphocytes Relative: 8 %
MCH: 27.6 pg (ref 26.0–34.0)
MCHC: 31.8 g/dL (ref 30.0–36.0)
MCV: 86.7 fL (ref 78.0–100.0)
Monocytes Absolute: 1.6 10*3/uL — ABNORMAL HIGH (ref 0.1–1.0)
Monocytes Relative: 13 %
NEUTROS PCT: 73 %
Neutro Abs: 9 10*3/uL — ABNORMAL HIGH (ref 1.7–7.7)
Platelets: 314 10*3/uL (ref 150–400)
RBC: 3.01 MIL/uL — ABNORMAL LOW (ref 4.22–5.81)
RDW: 14 % (ref 11.5–15.5)
WBC: 12.4 10*3/uL — ABNORMAL HIGH (ref 4.0–10.5)

## 2016-09-12 LAB — GLUCOSE, CAPILLARY
GLUCOSE-CAPILLARY: 160 mg/dL — AB (ref 65–99)
GLUCOSE-CAPILLARY: 173 mg/dL — AB (ref 65–99)
GLUCOSE-CAPILLARY: 175 mg/dL — AB (ref 65–99)
GLUCOSE-CAPILLARY: 229 mg/dL — AB (ref 65–99)
Glucose-Capillary: 171 mg/dL — ABNORMAL HIGH (ref 65–99)

## 2016-09-12 LAB — BASIC METABOLIC PANEL
ANION GAP: 7 (ref 5–15)
BUN: 12 mg/dL (ref 6–20)
CO2: 33 mmol/L — AB (ref 22–32)
CREATININE: 1.26 mg/dL — AB (ref 0.61–1.24)
Calcium: 10.1 mg/dL (ref 8.9–10.3)
Chloride: 96 mmol/L — ABNORMAL LOW (ref 101–111)
GFR calc Af Amer: 59 mL/min — ABNORMAL LOW (ref >60)
GFR calc non Af Amer: 51 mL/min — ABNORMAL LOW (ref >60)
Glucose, Bld: 173 mg/dL — ABNORMAL HIGH (ref 65–99)
POTASSIUM: 4 mmol/L (ref 3.5–5.1)
Sodium: 136 mmol/L (ref 135–145)

## 2016-09-12 LAB — VANCOMYCIN, RANDOM: VANCOMYCIN RM: 20

## 2016-09-12 MED ORDER — VANCOMYCIN HCL IN DEXTROSE 1-5 GM/200ML-% IV SOLN
1000.0000 mg | INTRAVENOUS | Status: DC
  Administered 2016-09-12 – 2016-09-14 (×3): 1000 mg via INTRAVENOUS
  Filled 2016-09-12 (×3): qty 200

## 2016-09-12 MED ORDER — SALINE SPRAY 0.65 % NA SOLN: 1.0000 | NASAL | Status: DC | PRN

## 2016-09-12 MED ORDER — ACETAMINOPHEN 160 MG/5ML PO SOLN
650.0000 mg | Freq: Four times a day (QID) | ORAL | Status: DC | PRN
  Administered 2016-09-12: 650 mg via ORAL
  Filled 2016-09-12: qty 20.3

## 2016-09-12 MED ORDER — FLUTICASONE PROPIONATE 50 MCG/ACT NA SUSP
2.0000 | Freq: Every day | NASAL | Status: DC
  Administered 2016-09-12 – 2016-09-15 (×4): 2 via NASAL
  Filled 2016-09-12: qty 16

## 2016-09-12 NOTE — Progress Notes (Signed)
Subjective: He says he feels okay. No significant complaints with his breathing. He wants to know how his foot is doing. No chest pain cough congestion  Objective: Vital signs in last 24 hours: Temp:  [97.5 F (36.4 C)-98.6 F (37 C)] 98.6 F (37 C) (09/08 0500) Pulse Rate:  [79-84] 79 (09/08 0500) Resp:  [18] 18 (09/08 0500) BP: (141-158)/(58-81) 144/81 (09/08 0500) SpO2:  [97 %-100 %] 97 % (09/08 0749) Weight:  [92.8 kg (204 lb 9.6 oz)] 92.8 kg (204 lb 9.6 oz) (09/08 0500) Weight change:  Last BM Date: 09/10/16  Intake/Output from previous day: 09/07 0701 - 09/08 0700 In: 910 [P.O.:360; IV Piggyback:550] Out: 300 [Urine:300]  PHYSICAL EXAM General appearance: alert, cooperative and mild distress Resp: rhonchi bilaterally Cardio: regular rate and rhythm, S1, S2 normal, no murmur, click, rub or gallop GI: soft, non-tender; bowel sounds normal; no masses,  no organomegaly Extremities: extremities normal, atraumatic, no cyanosis or edema His foot has some erythema on the dorsal surface and some surrounding heat.  Lab Results:  Results for orders placed or performed during the hospital encounter of 09/09/16 (from the past 48 hour(s))  Glucose, capillary     Status: Abnormal   Collection Time: 09/10/16 11:13 AM  Result Value Ref Range   Glucose-Capillary 179 (H) 65 - 99 mg/dL  Troponin I     Status: None   Collection Time: 09/10/16  1:19 PM  Result Value Ref Range   Troponin I <0.03 <0.03 ng/mL  CBC with Differential/Platelet     Status: Abnormal   Collection Time: 09/10/16  3:14 PM  Result Value Ref Range   WBC 14.6 (H) 4.0 - 10.5 K/uL   RBC 3.17 (Duncan) 4.22 - 5.81 MIL/uL   Hemoglobin 8.7 (Duncan) 13.0 - 17.0 g/dL   HCT 26.8 (Duncan) 39.0 - 52.0 %   MCV 84.5 78.0 - 100.0 fL   MCH 27.4 26.0 - 34.0 pg   MCHC 32.5 30.0 - 36.0 g/dL   RDW 13.9 11.5 - 15.5 %   Platelets 289 150 - 400 K/uL   Neutrophils Relative % 81 %   Neutro Abs 11.8 (H) 1.7 - 7.7 K/uL   Lymphocytes Relative 6 %   Lymphs Abs 0.9 0.7 - 4.0 K/uL   Monocytes Relative 10 %   Monocytes Absolute 1.4 (H) 0.1 - 1.0 K/uL   Eosinophils Relative 3 %   Eosinophils Absolute 0.5 0.0 - 0.7 K/uL   Basophils Relative 0 %   Basophils Absolute 0.0 0.0 - 0.1 K/uL  Glucose, capillary     Status: Abnormal   Collection Time: 09/10/16  4:25 PM  Result Value Ref Range   Glucose-Capillary 164 (H) 65 - 99 mg/dL  Basic metabolic panel     Status: Abnormal   Collection Time: 09/11/16  6:11 AM  Result Value Ref Range   Sodium 135 135 - 145 mmol/Duncan   Potassium 3.7 3.5 - 5.1 mmol/Duncan   Chloride 98 (Duncan) 101 - 111 mmol/Duncan   CO2 33 (H) 22 - 32 mmol/Duncan   Glucose, Bld 170 (H) 65 - 99 mg/dL   BUN 11 6 - 20 mg/dL   Creatinine, Ser 1.11 0.61 - 1.24 mg/dL   Calcium 9.7 8.9 - 10.3 mg/dL   GFR calc non Af Amer 59 (Duncan) >60 mL/min   GFR calc Af Amer >60 >60 mL/min    Comment: (NOTE) The eGFR has been calculated using the CKD EPI equation. This calculation has not been validated in all clinical  situations. eGFR's persistently <60 mL/min signify possible Chronic Kidney Disease.    Anion gap 4 (Duncan) 5 - 15  Vancomycin, trough     Status: Abnormal   Collection Time: 09/11/16  5:20 PM  Result Value Ref Range   Vancomycin Tr 37 (HH) 15 - 20 ug/mL    Comment: CRITICAL RESULT CALLED TO, READ BACK BY AND VERIFIED WITH: FOLEY,B ON 09/11/16 AT 1830 BY LOY,C   Glucose, capillary     Status: Abnormal   Collection Time: 09/12/16 12:21 AM  Result Value Ref Range   Glucose-Capillary 229 (H) 65 - 99 mg/dL  Basic metabolic panel     Status: Abnormal   Collection Time: 09/12/16  6:29 AM  Result Value Ref Range   Sodium 136 135 - 145 mmol/Duncan   Potassium 4.0 3.5 - 5.1 mmol/Duncan   Chloride 96 (Duncan) 101 - 111 mmol/Duncan   CO2 33 (H) 22 - 32 mmol/Duncan   Glucose, Bld 173 (H) 65 - 99 mg/dL   BUN 12 6 - 20 mg/dL   Creatinine, Ser 1.26 (H) 0.61 - 1.24 mg/dL   Calcium 10.1 8.9 - 10.3 mg/dL   GFR calc non Af Amer 51 (Duncan) >60 mL/min   GFR calc Af Amer 59 (Duncan) >60 mL/min     Comment: (NOTE) The eGFR has been calculated using the CKD EPI equation. This calculation has not been validated in all clinical situations. eGFR's persistently <60 mL/min signify possible Chronic Kidney Disease.    Anion gap 7 5 - 15  CBC with Differential/Platelet     Status: Abnormal   Collection Time: 09/12/16  6:29 AM  Result Value Ref Range   WBC 12.4 (H) 4.0 - 10.5 K/uL   RBC 3.01 (Duncan) 4.22 - 5.81 MIL/uL   Hemoglobin 8.3 (Duncan) 13.0 - 17.0 g/dL   HCT 26.1 (Duncan) 39.0 - 52.0 %   MCV 86.7 78.0 - 100.0 fL   MCH 27.6 26.0 - 34.0 pg   MCHC 31.8 30.0 - 36.0 g/dL   RDW 14.0 11.5 - 15.5 %   Platelets 314 150 - 400 K/uL   Neutrophils Relative % 73 %   Neutro Abs 9.0 (H) 1.7 - 7.7 K/uL   Lymphocytes Relative 8 %   Lymphs Abs 1.0 0.7 - 4.0 K/uL   Monocytes Relative 13 %   Monocytes Absolute 1.6 (H) 0.1 - 1.0 K/uL   Eosinophils Relative 6 %   Eosinophils Absolute 0.7 0.0 - 0.7 K/uL   Basophils Relative 0 %   Basophils Absolute 0.1 0.0 - 0.1 K/uL  Glucose, capillary     Status: Abnormal   Collection Time: 09/12/16  7:47 AM  Result Value Ref Range   Glucose-Capillary 171 (H) 65 - 99 mg/dL   Comment 1 Notify RN    Comment 2 Document in Chart     ABGS No results for input(s): PHART, PO2ART, TCO2, HCO3 in the last 72 hours.  Invalid input(s): PCO2 CULTURES Recent Results (from the past 240 hour(s))  Surgical pcr screen     Status: None   Collection Time: 09/03/16 11:33 AM  Result Value Ref Range Status   MRSA, PCR NEGATIVE NEGATIVE Final   Staphylococcus aureus NEGATIVE NEGATIVE Final    Comment: (NOTE) The Xpert SA Assay (FDA approved for NASAL specimens in patients 6 years of age and older), is one component of a comprehensive surveillance program. It is not intended to diagnose infection nor to guide or monitor treatment.   Aerobic/Anaerobic Culture (surgical/deep wound)  Status: None   Collection Time: 09/04/16 10:16 AM  Result Value Ref Range Status   Specimen  Description FOOT LEFT  Final   Special Requests NONE  Final   Gram Stain   Final    NO WBC SEEN RARE SQUAMOUS EPITHELIAL CELLS PRESENT FEW GRAM POSITIVE COCCI IN PAIRS RARE GRAM NEGATIVE RODS    Culture   Final    ABUNDANT ENTEROCOCCUS FAECALIS FEW KLEBSIELLA OXYTOCA NO ANAEROBES ISOLATED Performed at [facility], [place] [address] [place], [place] [address]    Report Status 09/09/2016 FINAL  Final   Organism ID, Bacteria ENTEROCOCCUS FAECALIS  Final   Organism ID, Bacteria KLEBSIELLA OXYTOCA  Final      Susceptibility   Enterococcus faecalis - MIC*    AMPICILLIN <=2 SENSITIVE Sensitive     VANCOMYCIN 1 SENSITIVE Sensitive     GENTAMICIN SYNERGY SENSITIVE Sensitive     * ABUNDANT ENTEROCOCCUS FAECALIS   Klebsiella oxytoca - MIC*    AMPICILLIN >=32 RESISTANT Resistant     CEFAZOLIN 8 SENSITIVE Sensitive     CEFEPIME <=1 SENSITIVE Sensitive     CEFTAZIDIME <=1 SENSITIVE Sensitive     CEFTRIAXONE <=1 SENSITIVE Sensitive     CIPROFLOXACIN <=0.25 SENSITIVE Sensitive     GENTAMICIN <=1 SENSITIVE Sensitive     IMIPENEM <=0.25 SENSITIVE Sensitive     TRIMETH/SULFA <=20 SENSITIVE Sensitive     AMPICILLIN/SULBACTAM 16 INTERMEDIATE Intermediate     PIP/TAZO <=4 SENSITIVE Sensitive     Extended ESBL NEGATIVE Sensitive     * FEW KLEBSIELLA OXYTOCA   Studies/Results: Ct Chest W Contrast  Result Date: 09/11/2016 CLINICAL DATA:  83 year old male with intermittent chest pain. Unresolved pneumonia. EXAM: CT CHEST WITH CONTRAST TECHNIQUE: Multidetector CT imaging of the chest was performed during intravenous contrast administration. CONTRAST:  1m ISOVUE-300 IOPAMIDOL (ISOVUE-300) INJECTION 61% COMPARISON:  Chest radiographs 09/09/2016 and earlier. Chest CTs 06/10/2016 and 01/11/2015 FINDINGS: Cardiovascular: Prior CABG. Stable cardiomegaly. No pericardial effusion. Calcified aortic atherosclerosis. Mediastinum/Nodes: Stable small mediastinal lymph nodes. No lymphadenopathy. Stable and  negative for age thyroid. Lungs/Pleura: Major airways are patent, with no residual retained secretions which were visible in the trachea in June. Layering small to moderate bilateral pleural effusions are progressed on the left and new on the right since June. Associated left greater than right confluent lower lobe opacity has also progressed, and there are associated air bronchograms, but the affected lung appears to be enhancing, favoring atelectasis over consolidation. Mild additional superimposed bilateral dependent and compressive atelectasis. No superimposed pulmonary nodularity. Upper Abdomen: Stable visualized upper abdominal viscera with no significant abnormality. Musculoskeletal: Osteopenia. Prior sternotomy. No thoracic vertebral fracture or acute osseous finding identified. IMPRESSION: 1. New/increased bilateral layering pleural effusions since June, small to moderate and greater on the left. 2. Associated increased and confluent bilateral lower lobe opacity, greater on the left, with air bronchograms. FBurtis Junesthis is atelectasis, although bilateral lower lobe pneumonia is difficult to exclude. 3. Otherwise stable chest including cardiomegaly, Aortic Atherosclerosis (ICD10-I70.0), prior CABG. Electronically Signed   By: HGenevie AnnM.D.   On: 09/11/2016 10:10    Medications:  Prior to Admission:  Prescriptions Prior to Admission  Medication Sig Dispense Refill Last Dose  . apixaban (ELIQUIS) 5 MG TABS tablet Take 5 mg by mouth 2 (two) times daily.   09/09/2016 at 1800  . cloNIDine (CATAPRES) 0.1 MG tablet Take 0.1 mg by mouth 2 (two) times daily.   unknown  . docusate sodium (COLACE) 100 MG capsule  Take 100 mg by mouth 2 (two) times daily.   unknown  . FLUoxetine (PROZAC) 20 MG/5ML solution Take 5 mLs (20 mg total) by mouth daily. 120 mL 3 unknown  . insulin glargine (LANTUS) 100 UNIT/ML injection Inject 0.3 mLs (30 Units total) into the skin at bedtime. 10 mL 11 unknown  . ipratropium-albuterol  (DUONEB) 0.5-2.5 (3) MG/3ML SOLN Take 3 mLs by nebulization every 6 (six) hours as needed (wheezing).   unknown  . LORazepam (ATIVAN) 1 MG tablet Take 1 tablet (1 mg total) by mouth at bedtime. 30 tablet 0 unknown  . mupirocin cream (BACTROBAN) 2 % Apply topically 2 (two) times daily. 15 g 0 unknown  . nitroGLYCERIN (NITROSTAT) 0.4 MG SL tablet Place 1 tablet (0.4 mg total) under the tongue every 5 (five) minutes as needed for chest pain. 30 tablet 12 unknown  . oxyCODONE (ROXICODONE) 15 MG immediate release tablet Take 30 mg by mouth every 8 (eight) hours as needed for pain.    unknown  . piperacillin-tazobactam (ZOSYN) IVPB Inject 3.375 g into the vein every 8 (eight) hours. Indication:  cellulitis Last Day of Therapy:  9/12//18 Labs - Once weekly:  CBC/D and BMP, Labs - Every other week:  ESR and CRP 120 Units 0 09/09/2016 at Unknown time  . povidone-iodine (BETADINE) 10 % external solution Apply topically as needed for wound care. (Patient taking differently: Apply topically 2 (two) times daily. Soaks left foot) 480 mL 0 09/09/2016 at Unknown time  . rosuvastatin (CRESTOR) 10 MG tablet Take 10 mg by mouth daily.   unknown  . vancomycin IVPB Inject 1,000 mg into the vein daily. Indication:  cellulitis Last Day of Therapy:  09/16/16 Labs - Sunday/Monday:  CBC/D, BMP, and vancomycin trough. Labs - Thursday:  BMP and vancomycin trough Labs - Every other week:  ESR and CRP 7000 Units 0 09/09/2016 at Unknown time  . metoprolol succinate (TOPROL-XL) 25 MG 24 hr tablet Take 3 tablets (75 mg total) by mouth daily. Take with or immediately following a meal. (Patient not taking: Reported on 09/09/2016) 90 tablet 4 Not Taking at Unknown time   Scheduled: . apixaban  5 mg Oral BID  . cloNIDine  0.1 mg Oral BID  . clotrimazole   Topical q morning - 10a  . docusate sodium  100 mg Oral BID  . FLUoxetine  20 mg Oral Daily  . insulin glargine  30 Units Subcutaneous QHS  . ipratropium-albuterol  3 mL Nebulization  TID  . LORazepam  1 mg Oral QHS  . mouth rinse  15 mL Mouth Rinse BID  . metoprolol succinate  75 mg Oral Daily  . mupirocin cream   Topical BID  . povidone-iodine   Topical BID  . rosuvastatin  10 mg Oral Daily  . sodium chloride flush  3 mL Intravenous Q12H   Continuous: . piperacillin-tazobactam (ZOSYN)  IV Stopped (09/12/16 0412)   AYT:[id number], nitroGLYCERIN, oxyCODONE  Assesment:He was admitted with healthcare associated pneumonia. He may actually have more atelectasis based on the CT which I have personally reviewed. I have ordered incentive spirometry. He isn't coughing very much now. He has a foot wound and I hadn't seen it before today so I'm not sure if it's better worse or the same but he still does have some erythema. He has COPD at baseline. He says his breathing is okay. Active Problems:   HCAP (healthcare-associated pneumonia)   COPD (chronic obstructive pulmonary disease) (HCC)   CAD (coronary artery disease)  Hyperlipidemia    Plan: Continue current treatments. He was switched back to Zosyn because of the potential for aspiration considering his previous history of swallowing difficulties. Add incentive spirometry.    LOS: 1 day   [name] 09/12/2016, 8:32 AM

## 2016-09-12 NOTE — Progress Notes (Addendum)
Pharmacy Antibiotic Note  [name] is a 83 y.o. male admitted on 09/09/2016 with cellulitis and  pneumonia.  Pharmacy has been consulted for Vancomycin and Cefepime dosing. Random Vancomycin level drawn ~ 16 hrs after dose = 27mcg/ml  Plan: Continue Vancomycin 1000mg  IV q24hrs, check trough level weekly or sooner if warranted Continue Zosyn 3.375gm IV q8h F/U cxs and clinical progress Monitor V/S, labs, and levels as indicated  Height: 5\' 11"  (180.3 cm) Weight: 204 lb 9.6 oz (92.8 kg) IBW/kg (Calculated) : 75.3  Temp (24hrs), Avg:98.2 F (36.8 C), Min:97.5 F (36.4 C), Max:98.6 F (37 C)   Recent Labs Lab 09/07/16 0604 09/07/16 1434 09/09/16 0456 09/09/16 2115 09/10/16 0648 09/10/16 1514 09/11/16 0611 09/11/16 1720 09/12/16 0629  WBC 16.2*  --  14.0* 15.0* 12.3* 14.6*  --   --  12.4*  CREATININE 1.16  --  1.23 1.34* 1.12  --  1.11  --   --   VANCOTROUGH  --  17  --   --   --   --   --  37*  --     Estimated Creatinine Clearance: 58.7 mL/min (by C-G formula based on SCr of 1.11 mg/dL).  No Known Allergies  Antimicrobials this admission: Vancomycin 8/28 >>  Cefepime 9/6 >> 9/7 Zosyn 8/28>>9/5, 9/7 >>  Microbiology results: 8/31  Foot cx: E.Faecalis s- amp and  vanc   K. Oxytoc s- cefepime, ceftriaxone R- Amp 8/20 MRSA PCR: neg  Pharmacokinetic dosing service  Objective:  Trough level 10-15 mcg/ml ====================   Weight: 92 Kilograms   Vancomycin single level analysis:  Current dose being given: 1000 mg Current dosing interval:  24 hrs,  Current infusion time (hrs): 1  Single level Trough Data:  RANDOM level obtained: 20 mcg/ml,  - Number of hrs since last dose: 16 Hrs  Assessment: ==================== Estimated PK Parameters: --------------------------- New rate constant (kel): 0.036 hr-1 half-life: 19.25 Hours Vd from levels: 64.40  Liters  (0.7 L/kg)  Recommendations: ==================== Give Vancomycin  1000 mg  q 24 hrs. Infuse  over 1 hrs Expected Cpeak: 26.4 mcg/ml    Expected Ctrough: 11.5 mcg/ml  Recommended labs and intervals: Measure Bun and Scr 3 times/week.   Thank you for the consult, will continue to follow. Thank you for allowing pharmacy to be a part of this patient's care.  Hart Robinsons, PharmD Clinical Pharmacist Pager:  [phone] 09/12/2016   09/12/2016 8:12 AM

## 2016-09-12 NOTE — Progress Notes (Signed)
Patient currently on vancomycin and Zosyn for H CAP as per pulmonology receiving Betadine soaks to the right left foot no significant respiratory distress patient has significant discomfort with fluid he is ambulating with physical therapy NED [name] KKX:[id number] DOB: Apr 16, 1931 DOA: 09/09/2016 PCP: Phillip Gaskins, MD   Physical Exam: Blood pressure (!) 144/81, pulse 88, temperature 98.6 F (37 C), temperature source Oral, resp. rate 18, height 5\' 11"  (1.803 [name]), weight 92.8 kg (204 lb 9.6 oz), SpO2 97 %. Lungs show diminished breath sounds in the bases scattered rhonchi no rales no wheezes audible heart irregular rhythm no S3 no heaves thrills or rubs   Investigations:  Recent Results (from the past 240 hour(s))  Surgical pcr screen     Status: None   Collection Time: 09/03/16 11:33 AM  Result Value Ref Range Status   MRSA, PCR NEGATIVE NEGATIVE Final   Staphylococcus aureus NEGATIVE NEGATIVE Final    Comment: (NOTE) The Xpert SA Assay (FDA approved for NASAL specimens in patients 73 years of age and older), is one component of a comprehensive surveillance program. It is not intended to diagnose infection nor to guide or monitor treatment.   Aerobic/Anaerobic Culture (surgical/deep wound)     Status: None   Collection Time: 09/04/16 10:16 AM  Result Value Ref Range Status   Specimen Description FOOT LEFT  Final   Special Requests NONE  Final   Gram Stain   Final    NO WBC SEEN RARE SQUAMOUS EPITHELIAL CELLS PRESENT FEW GRAM POSITIVE COCCI IN PAIRS RARE GRAM NEGATIVE RODS    Culture   Final    ABUNDANT ENTEROCOCCUS FAECALIS FEW KLEBSIELLA OXYTOCA NO ANAEROBES ISOLATED Performed at [facility], [place] [address], [place], [place] [address]    Report Status 09/09/2016 FINAL  Final   Organism ID, Bacteria ENTEROCOCCUS FAECALIS  Final   Organism ID, Bacteria KLEBSIELLA OXYTOCA  Final      Susceptibility   Enterococcus faecalis - MIC*    AMPICILLIN <=2 SENSITIVE  Sensitive     VANCOMYCIN 1 SENSITIVE Sensitive     GENTAMICIN SYNERGY SENSITIVE Sensitive     * ABUNDANT ENTEROCOCCUS FAECALIS   Klebsiella oxytoca - MIC*    AMPICILLIN >=32 RESISTANT Resistant     CEFAZOLIN 8 SENSITIVE Sensitive     CEFEPIME <=1 SENSITIVE Sensitive     CEFTAZIDIME <=1 SENSITIVE Sensitive     CEFTRIAXONE <=1 SENSITIVE Sensitive     CIPROFLOXACIN <=0.25 SENSITIVE Sensitive     GENTAMICIN <=1 SENSITIVE Sensitive     IMIPENEM <=0.25 SENSITIVE Sensitive     TRIMETH/SULFA <=20 SENSITIVE Sensitive     AMPICILLIN/SULBACTAM 16 INTERMEDIATE Intermediate     PIP/TAZO <=4 SENSITIVE Sensitive     Extended ESBL NEGATIVE Sensitive     * FEW KLEBSIELLA OXYTOCA     Basic Metabolic Panel:  Recent Labs  09/11/16 0611 09/12/16 0629  NA 135 136  K 3.7 4.0  CL 98* 96*  CO2 33* 33*  GLUCOSE 170* 173*  BUN 11 12  CREATININE 1.11 1.26*  CALCIUM 9.7 10.1   Liver Function Tests:  Recent Labs  09/10/16 0648  AST 16  ALT 22  ALKPHOS 142*  BILITOT 0.5  PROT 5.5*  ALBUMIN 2.2*     CBC:  Recent Labs  09/10/16 1514 09/12/16 0629  WBC 14.6* 12.4*  NEUTROABS 11.8* 9.0*  HGB 8.7* 8.3*  HCT 26.8* 26.1*  MCV 84.5 86.7  PLT 289 314    Ct Chest W Contrast  Result Date: 09/11/2016 CLINICAL DATA:  83 year old male with intermittent chest pain. Unresolved pneumonia. EXAM: CT CHEST WITH CONTRAST TECHNIQUE: Multidetector CT imaging of the chest was performed during intravenous contrast administration. CONTRAST:  62mL ISOVUE-300 IOPAMIDOL (ISOVUE-300) INJECTION 61% COMPARISON:  Chest radiographs 09/09/2016 and earlier. Chest CTs 06/10/2016 and 01/11/2015 FINDINGS: Cardiovascular: Prior CABG. Stable cardiomegaly. No pericardial effusion. Calcified aortic atherosclerosis. Mediastinum/Nodes: Stable small mediastinal lymph nodes. No lymphadenopathy. Stable and negative for age thyroid. Lungs/Pleura: Major airways are patent, with no residual retained secretions which were visible in  the trachea in June. Layering small to moderate bilateral pleural effusions are progressed on the left and new on the right since June. Associated left greater than right confluent lower lobe opacity has also progressed, and there are associated air bronchograms, but the affected lung appears to be enhancing, favoring atelectasis over consolidation. Mild additional superimposed bilateral dependent and compressive atelectasis. No superimposed pulmonary nodularity. Upper Abdomen: Stable visualized upper abdominal viscera with no significant abnormality. Musculoskeletal: Osteopenia. Prior sternotomy. No thoracic vertebral fracture or acute osseous finding identified. IMPRESSION: 1. New/increased bilateral layering pleural effusions since June, small to moderate and greater on the left. 2. Associated increased and confluent bilateral lower lobe opacity, greater on the left, with air bronchograms. Burtis Junes this is atelectasis, although bilateral lower lobe pneumonia is difficult to exclude. 3. Otherwise stable chest including cardiomegaly, Aortic Atherosclerosis (ICD10-I70.0), prior CABG. Electronically Signed   By: [name] [name].D.   On: 09/11/2016 10:10      Medications:  Impression: Active Problems:   HCAP (healthcare-associated pneumonia)   COPD (chronic obstructive pulmonary disease) (HCC)   CAD (coronary artery disease)   Hyperlipidemia   Cellulitis and abscess of toe of left foot     Plan: Continue DuoNeb 4 times a day. Continue vancomycin and Zosyn. Continue physical therapy and ambulation Betadine soaks to left foot twice a day.  Consultants: Gastroenterology requested pulmonary general surgery physical therapy   Procedures status post IND of left foot 6 days prior to visit today   Antibiotics: Vancomycin and Zosyn        Time spent: 30 minutes   LOS: 1 day   Tmya Duncan [name]   09/12/2016, 11:13 AM

## 2016-09-12 NOTE — Progress Notes (Signed)
Subjective: Patient states his breathing is better.  Objective: Vital signs in last 24 hours: Temp:  [97.5 F (36.4 C)-98.6 F (37 C)] 98.6 F (37 C) (09/08 0500) Pulse Rate:  [79-88] 88 (09/08 0923) Resp:  [18] 18 (09/08 0500) BP: (141-149)/(58-81) 144/81 (09/08 0500) SpO2:  [97 %-100 %] 97 % (09/08 0749) Weight:  [204 lb 9.6 oz (92.8 kg)] 204 lb 9.6 oz (92.8 kg) (09/08 0500) Last BM Date: 09/10/16  Intake/Output from previous day: 09/07 0701 - 09/08 0700 In: 910 [P.O.:360; IV Piggyback:550] Out: 300 [Urine:300] Intake/Output this shift: Total I/O In: 240 [P.O.:240] Out: -   General appearance: alert, cooperative and no distress Incision/Wound:. The patient had some packing in the left foot. This was removed. Left foot with significantly less edema. Erythema resolving.  Lab Results:   Recent Labs  09/10/16 1514 09/12/16 0629  WBC 14.6* 12.4*  HGB 8.7* 8.3*  HCT 26.8* 26.1*  PLT 289 314   BMET  Recent Labs  09/11/16 0611 09/12/16 0629  NA 135 136  K 3.7 4.0  CL 98* 96*  CO2 33* 33*  GLUCOSE 170* 173*  BUN 11 12  CREATININE 1.11 1.26*  CALCIUM 9.7 10.1   PT/INR No results for input(s): LABPROT, INR in the last 72 hours.  Studies/Results: Ct Chest W Contrast  Result Date: 09/11/2016 CLINICAL DATA:  83 year old male with intermittent chest pain. Unresolved pneumonia. EXAM: CT CHEST WITH CONTRAST TECHNIQUE: Multidetector CT imaging of the chest was performed during intravenous contrast administration. CONTRAST:  29m ISOVUE-300 IOPAMIDOL (ISOVUE-300) INJECTION 61% COMPARISON:  Chest radiographs 09/09/2016 and earlier. Chest CTs 06/10/2016 and 01/11/2015 FINDINGS: Cardiovascular: Prior CABG. Stable cardiomegaly. No pericardial effusion. Calcified aortic atherosclerosis. Mediastinum/Nodes: Stable small mediastinal lymph nodes. No lymphadenopathy. Stable and negative for age thyroid. Lungs/Pleura: Major airways are patent, with no residual retained secretions  which were visible in the trachea in June. Layering small to moderate bilateral pleural effusions are progressed on the left and new on the right since June. Associated left greater than right confluent lower lobe opacity has also progressed, and there are associated air bronchograms, but the affected lung appears to be enhancing, favoring atelectasis over consolidation. Mild additional superimposed bilateral dependent and compressive atelectasis. No superimposed pulmonary nodularity. Upper Abdomen: Stable visualized upper abdominal viscera with no significant abnormality. Musculoskeletal: Osteopenia. Prior sternotomy. No thoracic vertebral fracture or acute osseous finding identified. IMPRESSION: 1. New/increased bilateral layering pleural effusions since June, small to moderate and greater on the left. 2. Associated increased and confluent bilateral lower lobe opacity, greater on the left, with air bronchograms. FBurtis Junesthis is atelectasis, although bilateral lower lobe pneumonia is difficult to exclude. 3. Otherwise stable chest including cardiomegaly, Aortic Atherosclerosis (ICD10-I70.0), prior CABG. Electronically Signed   By: HGenevie AnnM.D.   On: 09/11/2016 10:10    Anti-infectives: Anti-infectives    Start     Dose/Rate Route Frequency Ordered Stop   09/11/16 0900  piperacillin-tazobactam (ZOSYN) IVPB 3.375 g     3.375 g 12.5 mL/hr over 240 Minutes Intravenous Every 8 hours 09/11/16 0829     09/10/16 1800  vancomycin (VANCOCIN) IVPB 1000 mg/200 mL premix  Status:  Discontinued     1,000 mg 200 mL/hr over 60 Minutes Intravenous Every 24 hours 09/10/16 0800 09/12/16 0809   09/10/16 0900  ceFEPIme (MAXIPIME) 1 g in dextrose 5 % 50 mL IVPB  Status:  Discontinued     1 g 100 mL/hr over 30 Minutes Intravenous Every 8 hours 09/10/16 0845 09/11/16  0815   09/10/16 0145  ceFEPIme (MAXIPIME) 2 g in dextrose 5 % 50 mL IVPB     2 g 100 mL/hr over 30 Minutes Intravenous  Once 09/10/16 0130 09/10/16 0317    09/10/16 0101  vancomycin IVPB  Status:  Discontinued    Comments:  Indication:  cellulitis Last Day of Therapy:  09/16/16 Labs - Sunday/Monday:  CBC/D, BMP, and vancomycin trough. Labs - Thursday:  BMP and vancomycin trough Labs - Every other week:  ESR and CRP     1,000 mg Intravenous Every 24 hours 09/10/16 0101 09/10/16 0758      Assessment/Plan: Impression: Cellulitis with abscess of left foot, status post incision and drainage, slowly improving Plan: Wound care orders have been written. Will follow peripherally with you.  LOS: 1 day    Aviva Signs 09/12/2016

## 2016-09-12 NOTE — Progress Notes (Signed)
1155 - Patient c/o of pain at insertion site of PICC line RUE.  Edema to BUE.  Site WNL.  Flushes and blood return without difficulty. Dressing changed.  PICC line pulled about approx. 1 cm with biopatch removal.  Infusion stopped.  New dressing applied.  PICC line nurse on unit and notified.  MD notified.  Order for chest-ray given.  Dr. Roderic Palau notified via text page of chest x-ray results.
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
## 2016-09-13 ENCOUNTER — Inpatient Hospital Stay (HOSPITAL_COMMUNITY): Payer: Medicare Other

## 2016-09-13 LAB — GLUCOSE, CAPILLARY
Glucose-Capillary: 103 mg/dL — ABNORMAL HIGH (ref 65–99)
Glucose-Capillary: 154 mg/dL — ABNORMAL HIGH (ref 65–99)
Glucose-Capillary: 136 mg/dL — ABNORMAL HIGH (ref 65–99)
Glucose-Capillary: 140 mg/dL — ABNORMAL HIGH (ref 65–99)

## 2016-09-13 LAB — BASIC METABOLIC PANEL
ANION GAP: 5 (ref 5–15)
BUN: 12 mg/dL (ref 6–20)
CO2: 33 mmol/L — ABNORMAL HIGH (ref 22–32)
Calcium: 9.8 mg/dL (ref 8.9–10.3)
Chloride: 97 mmol/L — ABNORMAL LOW (ref 101–111)
Creatinine, Ser: 1.42 mg/dL — ABNORMAL HIGH (ref 0.61–1.24)
GFR calc Af Amer: 51 mL/min — ABNORMAL LOW (ref >60)
GFR, EST NON AFRICAN AMERICAN: 44 mL/min — AB (ref >60)
GLUCOSE: 127 mg/dL — AB (ref 65–99)
POTASSIUM: 3.9 mmol/L (ref 3.5–5.1)
Sodium: 135 mmol/L (ref 135–145)

## 2016-09-13 LAB — CBC WITH DIFFERENTIAL/PLATELET
BASOS ABS: 0.1 10*3/uL (ref 0.0–0.1)
Basophils Relative: 1 %
Eosinophils Absolute: 0.6 10*3/uL (ref 0.0–0.7)
Eosinophils Relative: 5 %
HEMATOCRIT: 24.2 % — AB (ref 39.0–52.0)
Hemoglobin: 7.7 g/dL — ABNORMAL LOW (ref 13.0–17.0)
LYMPHS PCT: 9 %
Lymphs Abs: 1 10*3/uL (ref 0.7–4.0)
MCH: 27.7 pg (ref 26.0–34.0)
MCHC: 31.8 g/dL (ref 30.0–36.0)
MCV: 87.1 fL (ref 78.0–100.0)
MONO ABS: 1.4 10*3/uL — AB (ref 0.1–1.0)
Monocytes Relative: 13 %
NEUTROS ABS: 7.7 10*3/uL (ref 1.7–7.7)
Neutrophils Relative %: 72 %
Platelets: 297 10*3/uL (ref 150–400)
RBC: 2.78 MIL/uL — AB (ref 4.22–5.81)
RDW: 14.2 % (ref 11.5–15.5)
WBC: 10.7 10*3/uL — ABNORMAL HIGH (ref 4.0–10.5)

## 2016-09-13 MED ORDER — FUROSEMIDE 10 MG/ML IJ SOLN
40.0000 mg | Freq: Once | INTRAMUSCULAR | Status: AC
  Administered 2016-09-13: 40 mg via INTRAVENOUS
  Filled 2016-09-13: qty 4

## 2016-09-13 MED ORDER — APIXABAN 5 MG PO TABS: 5.0000 mg | ORAL_TABLET | Freq: Two times a day (BID) | ORAL | Status: DC

## 2016-09-13 MED ORDER — DIPHENHYDRAMINE HCL 12.5 MG/5ML PO ELIX
12.5000 mg | ORAL_SOLUTION | Freq: Four times a day (QID) | ORAL | Status: DC | PRN
  Administered 2016-09-13 – 2016-09-14 (×2): 12.5 mg via ORAL
  Filled 2016-09-13 (×2): qty 5

## 2016-09-13 MED ORDER — SODIUM CHLORIDE 0.9 % IV SOLN
INTRAVENOUS | Status: DC
  Administered 2016-09-13: 22:00:00 via INTRAVENOUS

## 2016-09-13 MED ORDER — OXYCODONE HCL 5 MG PO TABS
15.0000 mg | ORAL_TABLET | Freq: Three times a day (TID) | ORAL | Status: DC | PRN
  Administered 2016-09-13 – 2016-09-15 (×3): 15 mg via ORAL
  Filled 2016-09-13 (×3): qty 3

## 2016-09-13 MED ORDER — FLUOCINONIDE 0.05 % EX CREA
TOPICAL_CREAM | Freq: Three times a day (TID) | CUTANEOUS | Status: DC
  Administered 2016-09-13: 22:00:00 via TOPICAL
  Administered 2016-09-13: 1 via TOPICAL
  Administered 2016-09-14 (×2): via TOPICAL
  Administered 2016-09-14: 1 via TOPICAL
  Administered 2016-09-15: 11:00:00 via TOPICAL
  Filled 2016-09-13: qty 30

## 2016-09-13 NOTE — Progress Notes (Signed)
Subjective: He says he feels okay as far as his breathing is concerned. However he is having trouble with his back itching. He says he still has pain in his foot.  Objective: Vital signs in last 24 hours: Temp:  [97.5 F (36.4 C)-98.5 F (36.9 C)] 97.5 F (36.4 C) (09/09 0424) Pulse Rate:  [69-84] 69 (09/09 0424) Resp:  [18-20] 18 (09/09 0424) BP: (128-132)/(54-78) 129/54 (09/09 0424) SpO2:  [96 %-100 %] 100 % (09/09 0424) Weight change:  Last BM Date: 09/10/16  Intake/Output from previous day: 09/08 0701 - 09/09 0700 In: 713 [P.O.:360; I.V.:3; IV Piggyback:350] Out: 350 [Urine:350]  PHYSICAL EXAM General appearance: alert, cooperative and no distress Resp: rhonchi bilaterally Cardio: irregularly irregular rhythm GI: soft, non-tender; bowel sounds normal; no masses,  no organomegaly Extremities: His left foot is in a boot He does not have a rash on his back  Lab Results:  Results for orders placed or performed during the hospital encounter of 09/09/16 (from the past 48 hour(s))  Vancomycin, trough     Status: Abnormal   Collection Time: 09/11/16  5:20 PM  Result Value Ref Range   Vancomycin Tr 37 (HH) 15 - 20 ug/mL    Comment: CRITICAL RESULT CALLED TO, READ BACK BY AND VERIFIED WITH: FOLEY,B ON 09/11/16 AT 1830 BY LOY,C   Glucose, capillary     Status: Abnormal   Collection Time: 09/12/16 12:21 AM  Result Value Ref Range   Glucose-Capillary 229 (H) 65 - 99 mg/dL  Basic metabolic panel     Status: Abnormal   Collection Time: 09/12/16  6:29 AM  Result Value Ref Range   Sodium 136 135 - 145 mmol/L   Potassium 4.0 3.5 - 5.1 mmol/L   Chloride 96 (L) 101 - 111 mmol/L   CO2 33 (H) 22 - 32 mmol/L   Glucose, Bld 173 (H) 65 - 99 mg/dL   BUN 12 6 - 20 mg/dL   Creatinine, Ser 1.26 (H) 0.61 - 1.24 mg/dL   Calcium 10.1 8.9 - 10.3 mg/dL   GFR calc non Af Amer 51 (L) >60 mL/min   GFR calc Af Amer 59 (L) >60 mL/min    Comment: (NOTE) The eGFR has been calculated using the CKD  EPI equation. This calculation has not been validated in all clinical situations. eGFR's persistently <60 mL/min signify possible Chronic Kidney Disease.    Anion gap 7 5 - 15  CBC with Differential/Platelet     Status: Abnormal   Collection Time: 09/12/16  6:29 AM  Result Value Ref Range   WBC 12.4 (H) 4.0 - 10.5 K/uL   RBC 3.01 (L) 4.22 - 5.81 MIL/uL   Hemoglobin 8.3 (L) 13.0 - 17.0 g/dL   HCT 26.1 (L) 39.0 - 52.0 %   MCV 86.7 78.0 - 100.0 fL   MCH 27.6 26.0 - 34.0 pg   MCHC 31.8 30.0 - 36.0 g/dL   RDW 14.0 11.5 - 15.5 %   Platelets 314 150 - 400 K/uL   Neutrophils Relative % 73 %   Neutro Abs 9.0 (H) 1.7 - 7.7 K/uL   Lymphocytes Relative 8 %   Lymphs Abs 1.0 0.7 - 4.0 K/uL   Monocytes Relative 13 %   Monocytes Absolute 1.6 (H) 0.1 - 1.0 K/uL   Eosinophils Relative 6 %   Eosinophils Absolute 0.7 0.0 - 0.7 K/uL   Basophils Relative 0 %   Basophils Absolute 0.1 0.0 - 0.1 K/uL  Glucose, capillary     Status:  Abnormal   Collection Time: 09/12/16  7:47 AM  Result Value Ref Range   Glucose-Capillary 171 (H) 65 - 99 mg/dL   Comment 1 Notify RN    Comment 2 Document in Chart   Vancomycin, random     Status: None   Collection Time: 09/12/16  9:35 AM  Result Value Ref Range   Vancomycin Rm 20     Comment:        Random Vancomycin therapeutic range is dependent on dosage and time of specimen collection. A peak range is 20.0-40.0 ug/mL A trough range is 5.0-15.0 ug/mL          Glucose, capillary     Status: Abnormal   Collection Time: 09/12/16 11:34 AM  Result Value Ref Range   Glucose-Capillary 175 (H) 65 - 99 mg/dL   Comment 1 Notify RN    Comment 2 Document in Chart   Glucose, capillary     Status: Abnormal   Collection Time: 09/12/16  5:09 PM  Result Value Ref Range   Glucose-Capillary 160 (H) 65 - 99 mg/dL   Comment 1 Notify RN    Comment 2 Document in Chart   Glucose, capillary     Status: Abnormal   Collection Time: 09/12/16  8:28 PM  Result Value Ref Range    Glucose-Capillary 173 (H) 65 - 99 mg/dL  Basic metabolic panel     Status: Abnormal   Collection Time: 09/13/16  6:47 AM  Result Value Ref Range   Sodium 135 135 - 145 mmol/L   Potassium 3.9 3.5 - 5.1 mmol/L   Chloride 97 (L) 101 - 111 mmol/L   CO2 33 (H) 22 - 32 mmol/L   Glucose, Bld 127 (H) 65 - 99 mg/dL   BUN 12 6 - 20 mg/dL   Creatinine, Ser 1.42 (H) 0.61 - 1.24 mg/dL   Calcium 9.8 8.9 - 10.3 mg/dL   GFR calc non Af Amer 44 (L) >60 mL/min   GFR calc Af Amer 51 (L) >60 mL/min    Comment: (NOTE) The eGFR has been calculated using the CKD EPI equation. This calculation has not been validated in all clinical situations. eGFR's persistently <60 mL/min signify possible Chronic Kidney Disease.    Anion gap 5 5 - 15  CBC with Differential/Platelet     Status: Abnormal   Collection Time: 09/13/16  6:47 AM  Result Value Ref Range   WBC 10.7 (H) 4.0 - 10.5 K/uL   RBC 2.78 (L) 4.22 - 5.81 MIL/uL   Hemoglobin 7.7 (L) 13.0 - 17.0 g/dL   HCT 24.2 (L) 39.0 - 52.0 %   MCV 87.1 78.0 - 100.0 fL   MCH 27.7 26.0 - 34.0 pg   MCHC 31.8 30.0 - 36.0 g/dL   RDW 14.2 11.5 - 15.5 %   Platelets 297 150 - 400 K/uL   Neutrophils Relative % 72 %   Neutro Abs 7.7 1.7 - 7.7 K/uL   Lymphocytes Relative 9 %   Lymphs Abs 1.0 0.7 - 4.0 K/uL   Monocytes Relative 13 %   Monocytes Absolute 1.4 (H) 0.1 - 1.0 K/uL   Eosinophils Relative 5 %   Eosinophils Absolute 0.6 0.0 - 0.7 K/uL   Basophils Relative 1 %   Basophils Absolute 0.1 0.0 - 0.1 K/uL  Glucose, capillary     Status: Abnormal   Collection Time: 09/13/16  8:01 AM  Result Value Ref Range   Glucose-Capillary 103 (H) 65 - 99 mg/dL  ABGS No results for input(s): PHART, PO2ART, TCO2, HCO3 in the last 72 hours.  Invalid input(s): PCO2 CULTURES Recent Results (from the past 240 hour(s))  Surgical pcr screen     Status: None   Collection Time: 09/03/16 11:33 AM  Result Value Ref Range Status   MRSA, PCR NEGATIVE NEGATIVE Final   Staphylococcus  aureus NEGATIVE NEGATIVE Final    Comment: (NOTE) The Xpert SA Assay (FDA approved for NASAL specimens in patients 8 years of age and older), is one component of a comprehensive surveillance program. It is not intended to diagnose infection nor to guide or monitor treatment.   Aerobic/Anaerobic Culture (surgical/deep wound)     Status: None   Collection Time: 09/04/16 10:16 AM  Result Value Ref Range Status   Specimen Description FOOT LEFT  Final   Special Requests NONE  Final   Gram Stain   Final    NO WBC SEEN RARE SQUAMOUS EPITHELIAL CELLS PRESENT FEW GRAM POSITIVE COCCI IN PAIRS RARE GRAM NEGATIVE RODS    Culture   Final    ABUNDANT ENTEROCOCCUS FAECALIS FEW KLEBSIELLA OXYTOCA NO ANAEROBES ISOLATED Performed at [facility], [place] [address] [place], [place] [address]    Report Status 09/09/2016 FINAL  Final   Organism ID, Bacteria ENTEROCOCCUS FAECALIS  Final   Organism ID, Bacteria KLEBSIELLA OXYTOCA  Final      Susceptibility   Enterococcus faecalis - MIC*    AMPICILLIN <=2 SENSITIVE Sensitive     VANCOMYCIN 1 SENSITIVE Sensitive     GENTAMICIN SYNERGY SENSITIVE Sensitive     * ABUNDANT ENTEROCOCCUS FAECALIS   Klebsiella oxytoca - MIC*    AMPICILLIN >=32 RESISTANT Resistant     CEFAZOLIN 8 SENSITIVE Sensitive     CEFEPIME <=1 SENSITIVE Sensitive     CEFTAZIDIME <=1 SENSITIVE Sensitive     CEFTRIAXONE <=1 SENSITIVE Sensitive     CIPROFLOXACIN <=0.25 SENSITIVE Sensitive     GENTAMICIN <=1 SENSITIVE Sensitive     IMIPENEM <=0.25 SENSITIVE Sensitive     TRIMETH/SULFA <=20 SENSITIVE Sensitive     AMPICILLIN/SULBACTAM 16 INTERMEDIATE Intermediate     PIP/TAZO <=4 SENSITIVE Sensitive     Extended ESBL NEGATIVE Sensitive     * FEW KLEBSIELLA OXYTOCA   Studies/Results: Ct Chest W Contrast  Result Date: 09/11/2016 CLINICAL DATA:  83 year old male with intermittent chest pain. Unresolved pneumonia. EXAM: CT CHEST WITH CONTRAST TECHNIQUE: Multidetector CT imaging of  the chest was performed during intravenous contrast administration. CONTRAST:  65m ISOVUE-300 IOPAMIDOL (ISOVUE-300) INJECTION 61% COMPARISON:  Chest radiographs 09/09/2016 and earlier. Chest CTs 06/10/2016 and 01/11/2015 FINDINGS: Cardiovascular: Prior CABG. Stable cardiomegaly. No pericardial effusion. Calcified aortic atherosclerosis. Mediastinum/Nodes: Stable small mediastinal lymph nodes. No lymphadenopathy. Stable and negative for age thyroid. Lungs/Pleura: Major airways are patent, with no residual retained secretions which were visible in the trachea in June. Layering small to moderate bilateral pleural effusions are progressed on the left and new on the right since June. Associated left greater than right confluent lower lobe opacity has also progressed, and there are associated air bronchograms, but the affected lung appears to be enhancing, favoring atelectasis over consolidation. Mild additional superimposed bilateral dependent and compressive atelectasis. No superimposed pulmonary nodularity. Upper Abdomen: Stable visualized upper abdominal viscera with no significant abnormality. Musculoskeletal: Osteopenia. Prior sternotomy. No thoracic vertebral fracture or acute osseous finding identified. IMPRESSION: 1. New/increased bilateral layering pleural effusions since June, small to moderate and greater on the left. 2. Associated increased and confluent bilateral lower  lobe opacity, greater on the left, with air bronchograms. Burtis Junes this is atelectasis, although bilateral lower lobe pneumonia is difficult to exclude. 3. Otherwise stable chest including cardiomegaly, Aortic Atherosclerosis (ICD10-I70.0), prior CABG. Electronically Signed   By: Genevie Ann M.D.   On: 09/11/2016 10:10   Dg Chest Port 1 View  Result Date: 09/12/2016 CLINICAL DATA:  PICC line placement. EXAM: PORTABLE CHEST 1 VIEW COMPARISON:  Chest x-ray dated 09/09/2016. FINDINGS: Right-sided PICC line in place. Tip appears stable in position  at the level of the mid SVC. Heart size and mediastinal contours are stable. Opacity at the left lung base appears stable, likely atelectasis and/or small effusion. No new lung findings. IMPRESSION: PICC line appears adequately positioned at the level of the mid SVC. Electronically Signed   By: Franki Cabot M.D.   On: 09/12/2016 13:04    Medications:  Prior to Admission:  Prescriptions Prior to Admission  Medication Sig Dispense Refill Last Dose  . apixaban (ELIQUIS) 5 MG TABS tablet Take 5 mg by mouth 2 (two) times daily.   09/09/2016 at 1800  . cloNIDine (CATAPRES) 0.1 MG tablet Take 0.1 mg by mouth 2 (two) times daily.   unknown  . docusate sodium (COLACE) 100 MG capsule Take 100 mg by mouth 2 (two) times daily.   unknown  . FLUoxetine (PROZAC) 20 MG/5ML solution Take 5 mLs (20 mg total) by mouth daily. 120 mL 3 unknown  . insulin glargine (LANTUS) 100 UNIT/ML injection Inject 0.3 mLs (30 Units total) into the skin at bedtime. 10 mL 11 unknown  . ipratropium-albuterol (DUONEB) 0.5-2.5 (3) MG/3ML SOLN Take 3 mLs by nebulization every 6 (six) hours as needed (wheezing).   unknown  . LORazepam (ATIVAN) 1 MG tablet Take 1 tablet (1 mg total) by mouth at bedtime. 30 tablet 0 unknown  . mupirocin cream (BACTROBAN) 2 % Apply topically 2 (two) times daily. 15 g 0 unknown  . nitroGLYCERIN (NITROSTAT) 0.4 MG SL tablet Place 1 tablet (0.4 mg total) under the tongue every 5 (five) minutes as needed for chest pain. 30 tablet 12 unknown  . oxyCODONE (ROXICODONE) 15 MG immediate release tablet Take 30 mg by mouth every 8 (eight) hours as needed for pain.    unknown  . piperacillin-tazobactam (ZOSYN) IVPB Inject 3.375 g into the vein every 8 (eight) hours. Indication:  cellulitis Last Day of Therapy:  9/12//18 Labs - Once weekly:  CBC/D and BMP, Labs - Every other week:  ESR and CRP 120 Units 0 09/09/2016 at Unknown time  . povidone-iodine (BETADINE) 10 % external solution Apply topically as needed for wound  care. (Patient taking differently: Apply topically 2 (two) times daily. Soaks left foot) 480 mL 0 09/09/2016 at Unknown time  . rosuvastatin (CRESTOR) 10 MG tablet Take 10 mg by mouth daily.   unknown  . vancomycin IVPB Inject 1,000 mg into the vein daily. Indication:  cellulitis Last Day of Therapy:  09/16/16 Labs - Sunday/Monday:  CBC/D, BMP, and vancomycin trough. Labs - Thursday:  BMP and vancomycin trough Labs - Every other week:  ESR and CRP 7000 Units 0 09/09/2016 at Unknown time  . metoprolol succinate (TOPROL-XL) 25 MG 24 hr tablet Take 3 tablets (75 mg total) by mouth daily. Take with or immediately following a meal. (Patient not taking: Reported on 09/09/2016) 90 tablet 4 Not Taking at Unknown time   Scheduled: . apixaban  5 mg Oral BID  . cloNIDine  0.1 mg Oral BID  . clotrimazole  Topical q morning - 10a  . docusate sodium  100 mg Oral BID  . FLUoxetine  20 mg Oral Daily  . fluticasone  2 spray Each Nare Daily  . insulin glargine  30 Units Subcutaneous QHS  . ipratropium-albuterol  3 mL Nebulization TID  . LORazepam  1 mg Oral QHS  . mouth rinse  15 mL Mouth Rinse BID  . metoprolol succinate  75 mg Oral Daily  . rosuvastatin  10 mg Oral Daily  . sodium chloride flush  3 mL Intravenous Q12H   Continuous: . piperacillin-tazobactam (ZOSYN)  IV Stopped (09/13/16 0411)  . vancomycin Stopped (09/12/16 1642)   OVZ:CHYIFOYDXAJOI (TYLENOL) oral liquid 160 mg/5 mL, ipratropium-albuterol, nitroGLYCERIN, oxyCODONE, sodium chloride  Assesment: He was admitted with increasing shortness of breath and abnormal chest x-ray. He has been diagnosed as healthcare associated pneumonia. His CT is more suggestive of atelectasis but I agree that he should continue on treatment for pneumonia since this is also being used to treat his cellulitis and abscess of his left foot. He is using incentive spirometer and says he feels like his breathing is better. He has COPD at baseline. He has chronic hypoxic  respiratory failure at baseline. He has some swallowing difficulty and GI has been consulted. He's complaining of itching of his back Active Problems:   HCAP (healthcare-associated pneumonia)   COPD (chronic obstructive pulmonary disease) (HCC)   CAD (coronary artery disease)   Hyperlipidemia   Cellulitis and abscess of toe of left foot    Plan: I took the liberty of ordering some cream and Benadryl. Continue other treatments.    LOS: 2 days   Jaicey Sweaney L 09/13/2016, 9:41 AM

## 2016-09-13 NOTE — Progress Notes (Signed)
Pt refused dressing change at this time. Pt stated he just wants to sleep. Will report to day nurse. Continue to monitor.

## 2016-09-13 NOTE — Progress Notes (Signed)
Pt signed consent for EGD with esophageal dilation performed by Dr [name]. Pt dressing on Right hand changed. Continue to Monitor.

## 2016-09-13 NOTE — Progress Notes (Addendum)
  Subjective:  Patient complains of constant pain at PICC line entry site. He denies shortness of breath. He also denies abdominal pain. He states he ate half of his breakfast. He does not have a good appetite. He denies melena or rectal bleeding.  Objective: Blood pressure (!) 129/54, pulse 69, temperature (!) 97.5 F (36.4 C), temperature source Oral, resp. rate 18, height 5\' 11"  (1.803 m), weight 204 lb 9.6 oz (92.8 kg), SpO2 100 %.  Patient is alert and in no acute distress. He has hearing impairment. He appears pale. Abdomen is full but soft and nontender. Edema noted  Both upper extremities and legs. He also has edema to his flanks.  Labs/studies Results:   Recent Labs  09/10/16 1514 09/12/16 0629 09/13/16 0647  WBC 14.6* 12.4* 10.7*  HGB 8.7* 8.3* 7.7*  HCT 26.8* 26.1* 24.2*  PLT 289 314 297    BMET   Recent Labs  09/11/16 0611 09/12/16 0629 09/13/16 0647  NA 135 136 135  K 3.7 4.0 3.9  CL 98* 96* 97*  CO2 33* 33* 33*  GLUCOSE 170* 173* 127*  BUN 11 12 12   CREATININE 1.11 1.26* 1.42*  CALCIUM 9.7 10.1 9.8      Assessment:  #1. Esophageal dysphagia. Differential diagnosis includes esophageal stricture with worsening GERD, pill esophagitis or esophageal motility disorder. He did experience symptomatic Improvement when esophagus was dilated over 3 years ago.  #2. Fluid overload. He has edema involving upper and lower extremities as well as flank edema. Fluid third spacing felt to be secondary to hypoalbuminemia.  #3. Anemia. Anemia felt to be multifactorial. Will rule out occult GI bleed as well as iron or B12 deficiency.  #4. Respiratory issues. Patient has COPD and he is undergoing therapy for hospital acquired pneumonia. Chest film from yesterday  Suggests atelectasis.  #5. Left foot cellulitis.patient had I&D for anew days ago and remains on antibiotic.  #6. Constant pain at PICC. Dr. Lorriane Shire determine if this line needs to be  removed.  Recommendations:  Furosemide 40 mg IV today. Esophagogastroduodenoscopy with esophageal dilation under monitored anesthesia care to be performed by Dr. [name] on 09/14/2016. Continue to hold Apixaban. Hemoccult 1. CBC, serum iron TIBC and ferritin in AM. B12 and folate levels in AM. Type and screen with a.m. lab

## 2016-09-13 NOTE — Progress Notes (Addendum)
Patient c/o pain of pain at insertion site of PICC line.  Site WNL.  Infusing.  Dr. Laural Golden on unit and notified.  Dr. Cindie Laroche called and notified.  Dr.  Cindie Laroche gave new order for pain medication given and ultrasound order placed for RUE.  Dr. Cindie Laroche to see patient today.

## 2016-09-13 NOTE — Progress Notes (Signed)
Sonogram negative for DVT of right arm and ankle still certain because of pain we'll get OT consult for movement and exercise patient scheduled for esophageal dilatation in a.m. not on any anticoagulation. He understands this he inquired as to what he go back to skilled nursing as that this would be decided by pulmonary who gave him a tentative date of witness day or so [name] TOI:[id number] DOB: 07-13-1931 DOA: 09/09/2016 PCP: [name], MD   Physical Exam: Blood pressure (!) 129/54, pulse 69, temperature (!) 97.5 F (36.4 C), temperature source Oral, resp. rate 18, height 5\' 11"  (1.803 m), weight 92.8 kg (204 lb 9.6 oz), SpO2 100 %. Lungs diminished breath sounds in the bases no rales wheeze rhonchi appreciable heart irregular rhythm no S3 auscultated no heaves thrills or rubs  Investigations:  Recent Results (from the past 240 hour(s))  Aerobic/Anaerobic Culture (surgical/deep wound)     Status: None   Collection Time: 09/04/16 10:16 AM  Result Value Ref Range Status   Specimen Description FOOT LEFT  Final   Special Requests NONE  Final   Gram Stain   Final    NO WBC SEEN RARE SQUAMOUS EPITHELIAL CELLS PRESENT FEW GRAM POSITIVE COCCI IN PAIRS RARE GRAM NEGATIVE RODS    Culture   Final    ABUNDANT ENTEROCOCCUS FAECALIS FEW KLEBSIELLA OXYTOCA NO ANAEROBES ISOLATED Performed at [facility], [place] [address], [place], [place] [address]    Report Status 09/09/2016 FINAL  Final   Organism ID, Bacteria ENTEROCOCCUS FAECALIS  Final   Organism ID, Bacteria KLEBSIELLA OXYTOCA  Final      Susceptibility   Enterococcus faecalis - MIC*    AMPICILLIN <=2 SENSITIVE Sensitive     VANCOMYCIN 1 SENSITIVE Sensitive     GENTAMICIN SYNERGY SENSITIVE Sensitive     * ABUNDANT ENTEROCOCCUS FAECALIS   Klebsiella oxytoca - MIC*    AMPICILLIN >=32 RESISTANT Resistant     CEFAZOLIN 8 SENSITIVE Sensitive     CEFEPIME <=1 SENSITIVE Sensitive     CEFTAZIDIME <=1 SENSITIVE Sensitive     CEFTRIAXONE <=1 SENSITIVE Sensitive     CIPROFLOXACIN <=0.25 SENSITIVE Sensitive     GENTAMICIN <=1 SENSITIVE Sensitive     IMIPENEM <=0.25 SENSITIVE Sensitive     TRIMETH/SULFA <=20 SENSITIVE Sensitive     AMPICILLIN/SULBACTAM 16 INTERMEDIATE Intermediate     PIP/TAZO <=4 SENSITIVE Sensitive     Extended ESBL NEGATIVE Sensitive     * FEW KLEBSIELLA OXYTOCA     Basic Metabolic Panel:  Recent Labs  09/12/16 0629 09/13/16 0647  NA 136 135  K 4.0 3.9  CL 96* 97*  CO2 33* 33*  GLUCOSE 173* 127*  BUN 12 12  CREATININE 1.26* 1.42*  CALCIUM 10.1 9.8   Liver Function Tests: No results for input(s): AST, ALT, ALKPHOS, BILITOT, PROT, ALBUMIN in the last 72 hours.   CBC:  Recent Labs  09/12/16 0629 09/13/16 0647  WBC 12.4* 10.7*  NEUTROABS 9.0* 7.7  HGB 8.3* 7.7*  HCT 26.1* 24.2*  MCV 86.7 87.1  PLT 314 297    Dg Chest Port 1 View  Result Date: 09/12/2016 CLINICAL DATA:  PICC line placement. EXAM: PORTABLE CHEST 1 VIEW COMPARISON:  Chest x-ray dated 09/09/2016. FINDINGS: Right-sided PICC line in place. Tip appears stable in position at the level of the mid SVC. Heart size and mediastinal contours are stable. Opacity at the left lung base appears stable, likely atelectasis and/or small effusion. No new lung findings. IMPRESSION:  PICC line appears adequately positioned at the level of the mid SVC. Electronically Signed   By: Franki Cabot M.D.   On: 09/12/2016 13:04      Medications  Impression:  Active Problems:   HCAP (healthcare-associated pneumonia)   COPD (chronic obstructive pulmonary disease) (HCC)   CAD (coronary artery disease)   Hyperlipidemia   Cellulitis and abscess of toe of left foot     Plan:Oxycodone diminished to 15 mg his normal dosage due to sedation  Consultants: Pulmonary, Gen. surgery, gastroenterology    Procedures   Antibiotics: Vancomycin and Zosyn infusing via PICC with no evidence of thrombus         Time spent: 40 minutes    LOS: 2 days   [name]   09/13/2016, 12:43 PM

## 2016-09-14 ENCOUNTER — Encounter (HOSPITAL_COMMUNITY)
Admission: EM | Disposition: A | Discharge status: Skilled Nursing Facility | Payer: Self-pay | Source: Home / Self Care | Attending: Family Medicine

## 2016-09-14 ENCOUNTER — Inpatient Hospital Stay (HOSPITAL_COMMUNITY): Payer: Medicare Other | Admitting: Anesthesiology

## 2016-09-14 HISTORY — PX: ESOPHAGEAL DILATION: SHX303

## 2016-09-14 HISTORY — PX: ESOPHAGOGASTRODUODENOSCOPY (EGD) WITH PROPOFOL: SHX5813

## 2016-09-14 LAB — GLUCOSE, CAPILLARY
GLUCOSE-CAPILLARY: 95 mg/dL (ref 65–99)
Glucose-Capillary: 95 mg/dL (ref 65–99)
Glucose-Capillary: 91 mg/dL (ref 65–99)
Glucose-Capillary: 88 mg/dL (ref 65–99)
Glucose-Capillary: 115 mg/dL — ABNORMAL HIGH (ref 65–99)

## 2016-09-14 LAB — TYPE AND SCREEN
ABO/RH(D): O NEG
Antibody Screen: NEGATIVE

## 2016-09-14 LAB — VITAMIN B12: VITAMIN B 12: 829 pg/mL (ref 180–914)

## 2016-09-14 LAB — FERRITIN: FERRITIN: 190 ng/mL (ref 24–336)

## 2016-09-14 LAB — FOLATE: FOLATE: 16 ng/mL (ref >5.9)

## 2016-09-14 LAB — IRON AND TIBC
Iron: 21 ug/dL — ABNORMAL LOW (ref 45–182)
Saturation Ratios: 9 % — ABNORMAL LOW (ref 17.9–39.5)
TIBC: 241 ug/dL — ABNORMAL LOW (ref 250–450)
UIBC: 220 ug/dL

## 2016-09-14 LAB — HEMOGLOBIN AND HEMATOCRIT, BLOOD
HEMATOCRIT: 26.1 % — AB (ref 39.0–52.0)
Hemoglobin: 8.2 g/dL — ABNORMAL LOW (ref 13.0–17.0)

## 2016-09-14 SURGERY — ESOPHAGOGASTRODUODENOSCOPY (EGD) WITH PROPOFOL
Anesthesia: Monitor Anesthesia Care

## 2016-09-14 MED ORDER — SODIUM CHLORIDE 0.9% FLUSH
INTRAVENOUS | Status: AC
  Filled 2016-09-14: qty 30

## 2016-09-14 MED ORDER — SODIUM CHLORIDE 0.9 % IV SOLN
INTRAVENOUS | Status: DC | PRN
  Administered 2016-09-14: 13:00:00 via INTRAVENOUS

## 2016-09-14 MED ORDER — MIDAZOLAM HCL 2 MG/2ML IJ SOLN
1.0000 mg | INTRAMUSCULAR | Status: DC
  Administered 2016-09-14: 1 mg via INTRAVENOUS

## 2016-09-14 MED ORDER — MIDAZOLAM HCL 2 MG/2ML IJ SOLN
INTRAMUSCULAR | Status: AC
  Filled 2016-09-14: qty 2

## 2016-09-14 MED ORDER — LACTATED RINGERS IV SOLN: INTRAVENOUS | Status: DC

## 2016-09-14 MED ORDER — SODIUM CHLORIDE 0.9 % IV SOLN: INTRAVENOUS | Status: DC | PRN

## 2016-09-14 MED ORDER — DEXTROSE 50 % IV SOLN
INTRAVENOUS | Status: AC
  Filled 2016-09-14: qty 50

## 2016-09-14 MED ORDER — PROPOFOL 10 MG/ML IV BOLUS: INTRAVENOUS | Status: DC | PRN

## 2016-09-14 MED ORDER — PROPOFOL 500 MG/50ML IV EMUL
INTRAVENOUS | Status: DC | PRN
  Administered 2016-09-14: 100 ug/kg/min via INTRAVENOUS

## 2016-09-14 MED ORDER — DEXTROSE 50 % IV SOLN
25.0000 mL | Freq: Once | INTRAVENOUS | Status: AC
  Administered 2016-09-14: 25 mL via INTRAVENOUS

## 2016-09-14 NOTE — Progress Notes (Addendum)
Subjective: He says he feels okay. His breathing is pretty good. He is scheduled for EGD with possible dilatation today. No other new complaints. He's not coughing much.  Objective: Vital signs in last 24 hours: Temp:  [97.7 F (36.5 C)-98.6 F (37 C)] 97.8 F (36.6 C) (09/10 0656) Pulse Rate:  [61-82] 78 (09/10 0656) Resp:  [18] 18 (09/10 0656) BP: (116-152)/(55-77) 152/77 (09/10 0656) SpO2:  [89 %-100 %] 89 % (09/10 0735) Weight:  [98.4 kg (216 lb 14.9 oz)] 98.4 kg (216 lb 14.9 oz) (09/10 0656) Weight change:  Last BM Date: 09/10/16  Intake/Output from previous day: 09/09 0701 - 09/10 0700 In: 1008.7 [P.O.:480; I.V.:178.7; IV Piggyback:350] Out: 2350 [Urine:2350]  PHYSICAL EXAM General appearance: alert, cooperative and mild distress Resp: rhonchi bilaterally Cardio: irregularly irregular rhythm GI: soft, non-tender; bowel sounds normal; no masses,  no organomegaly Extremities: His left foot is in a boot. Skin is still warm Neurologically intact  Lab Results:  Results for orders placed or performed during the hospital encounter of 09/09/16 (from the past 48 hour(s))  Vancomycin, random     Status: None   Collection Time: 09/12/16  9:35 AM  Result Value Ref Range   Vancomycin Rm 20     Comment:        Random Vancomycin therapeutic range is dependent on dosage and time of specimen collection. A peak range is 20.0-40.0 ug/mL A trough range is 5.0-15.0 ug/mL          Glucose, capillary     Status: Abnormal   Collection Time: 09/12/16 11:34 AM  Result Value Ref Range   Glucose-Capillary 175 (H) 65 - 99 mg/dL   Comment 1 Notify RN    Comment 2 Document in Chart   Glucose, capillary     Status: Abnormal   Collection Time: 09/12/16  5:09 PM  Result Value Ref Range   Glucose-Capillary 160 (H) 65 - 99 mg/dL   Comment 1 Notify RN    Comment 2 Document in Chart   Glucose, capillary     Status: Abnormal   Collection Time: 09/12/16  8:28 PM  Result Value Ref Range    Glucose-Capillary 173 (H) 65 - 99 mg/dL  Basic metabolic panel     Status: Abnormal   Collection Time: 09/13/16  6:47 AM  Result Value Ref Range   Sodium 135 135 - 145 mmol/L   Potassium 3.9 3.5 - 5.1 mmol/L   Chloride 97 (L) 101 - 111 mmol/L   CO2 33 (H) 22 - 32 mmol/L   Glucose, Bld 127 (H) 65 - 99 mg/dL   BUN 12 6 - 20 mg/dL   Creatinine, Ser 1.42 (H) 0.61 - 1.24 mg/dL   Calcium 9.8 8.9 - 10.3 mg/dL   GFR calc non Af Amer 44 (L) >60 mL/min   GFR calc Af Amer 51 (L) >60 mL/min    Comment: (NOTE) The eGFR has been calculated using the CKD EPI equation. This calculation has not been validated in all clinical situations. eGFR's persistently <60 mL/min signify possible Chronic Kidney Disease.    Anion gap 5 5 - 15  CBC with Differential/Platelet     Status: Abnormal   Collection Time: 09/13/16  6:47 AM  Result Value Ref Range   WBC 10.7 (H) 4.0 - 10.5 K/uL   RBC 2.78 (L) 4.22 - 5.81 MIL/uL   Hemoglobin 7.7 (L) 13.0 - 17.0 g/dL   HCT 24.2 (L) 39.0 - 52.0 %   MCV 87.1 78.0 -  100.0 fL   MCH 27.7 26.0 - 34.0 pg   MCHC 31.8 30.0 - 36.0 g/dL   RDW 14.2 11.5 - 15.5 %   Platelets 297 150 - 400 K/uL   Neutrophils Relative % 72 %   Neutro Abs 7.7 1.7 - 7.7 K/uL   Lymphocytes Relative 9 %   Lymphs Abs 1.0 0.7 - 4.0 K/uL   Monocytes Relative 13 %   Monocytes Absolute 1.4 (H) 0.1 - 1.0 K/uL   Eosinophils Relative 5 %   Eosinophils Absolute 0.6 0.0 - 0.7 K/uL   Basophils Relative 1 %   Basophils Absolute 0.1 0.0 - 0.1 K/uL  Glucose, capillary     Status: Abnormal   Collection Time: 09/13/16  8:01 AM  Result Value Ref Range   Glucose-Capillary 103 (H) 65 - 99 mg/dL  Glucose, capillary     Status: Abnormal   Collection Time: 09/13/16 11:31 AM  Result Value Ref Range   Glucose-Capillary 154 (H) 65 - 99 mg/dL   Comment 1 Notify RN    Comment 2 Document in Chart   Glucose, capillary     Status: Abnormal   Collection Time: 09/13/16  4:33 PM  Result Value Ref Range    Glucose-Capillary 136 (H) 65 - 99 mg/dL   Comment 1 Notify RN    Comment 2 Document in Chart   Glucose, capillary     Status: Abnormal   Collection Time: 09/13/16 10:04 PM  Result Value Ref Range   Glucose-Capillary 140 (H) 65 - 99 mg/dL  Hemoglobin and hematocrit, blood     Status: Abnormal   Collection Time: 09/14/16  6:14 AM  Result Value Ref Range   Hemoglobin 8.2 (L) 13.0 - 17.0 g/dL   HCT 26.1 (L) 39.0 - 52.0 %  Type and screen [facility]     Status: None (Preliminary result)   Collection Time: 09/14/16  6:14 AM  Result Value Ref Range   ABO/RH(D) O NEG    Antibody Screen PENDING    Sample Expiration 09/17/2016   Glucose, capillary     Status: None   Collection Time: 09/14/16  7:21 AM  Result Value Ref Range   Glucose-Capillary 95 65 - 99 mg/dL    ABGS No results for input(s): PHART, PO2ART, TCO2, HCO3 in the last 72 hours.  Invalid input(s): PCO2 CULTURES Recent Results (from the past 240 hour(s))  Aerobic/Anaerobic Culture (surgical/deep wound)     Status: None   Collection Time: 09/04/16 10:16 AM  Result Value Ref Range Status   Specimen Description FOOT LEFT  Final   Special Requests NONE  Final   Gram Stain   Final    NO WBC SEEN RARE SQUAMOUS EPITHELIAL CELLS PRESENT FEW GRAM POSITIVE COCCI IN PAIRS RARE GRAM NEGATIVE RODS    Culture   Final    ABUNDANT ENTEROCOCCUS FAECALIS FEW KLEBSIELLA OXYTOCA NO ANAEROBES ISOLATED Performed at [facility], [address], [place], [place] [address]    Report Status 09/09/2016 FINAL  Final   Organism ID, Bacteria ENTEROCOCCUS FAECALIS  Final   Organism ID, Bacteria KLEBSIELLA OXYTOCA  Final      Susceptibility   Enterococcus faecalis - MIC*    AMPICILLIN <=2 SENSITIVE Sensitive     VANCOMYCIN 1 SENSITIVE Sensitive     GENTAMICIN SYNERGY SENSITIVE Sensitive     * ABUNDANT ENTEROCOCCUS FAECALIS   Klebsiella oxytoca - MIC*    AMPICILLIN >=32 RESISTANT Resistant     CEFAZOLIN 8 SENSITIVE  Sensitive      CEFEPIME <=1 SENSITIVE Sensitive     CEFTAZIDIME <=1 SENSITIVE Sensitive     CEFTRIAXONE <=1 SENSITIVE Sensitive     CIPROFLOXACIN <=0.25 SENSITIVE Sensitive     GENTAMICIN <=1 SENSITIVE Sensitive     IMIPENEM <=0.25 SENSITIVE Sensitive     TRIMETH/SULFA <=20 SENSITIVE Sensitive     AMPICILLIN/SULBACTAM 16 INTERMEDIATE Intermediate     PIP/TAZO <=4 SENSITIVE Sensitive     Extended ESBL NEGATIVE Sensitive     * FEW KLEBSIELLA OXYTOCA   Studies/Results: US Venous Img Upper Uni Right  Result Date: 09/13/2016 CLINICAL DATA:  Right upper extremity pain with hand edema for 2 weeks. Right-sided PICC line in place. EXAM: RIGHT UPPER EXTREMITY VENOUS DOPPLER ULTRASOUND TECHNIQUE: Gray-scale sonography with graded compression, as well as color Doppler and duplex ultrasound were performed to evaluate the upper extremity deep venous system from the level of the subclavian vein and including the jugular, axillary, basilic, radial, ulnar and upper cephalic vein. Spectral Doppler was utilized to evaluate flow at rest and with distal augmentation maneuvers. COMPARISON:  None. FINDINGS: Contralateral Subclavian Vein: Respiratory phasicity is normal and symmetric with the symptomatic side. No evidence of thrombus. Normal compressibility. Internal Jugular Vein: No evidence of thrombus. Normal compressibility, respiratory phasicity and response to augmentation. Subclavian Vein: No evidence of thrombus. Normal compressibility, respiratory phasicity and response to augmentation. Axillary Vein: No evidence of thrombus. Normal compressibility, respiratory phasicity and response to augmentation. Cephalic Vein: No evidence of thrombus. Normal compressibility, respiratory phasicity and response to augmentation. Basilic Vein: No evidence of thrombus. Normal compressibility, respiratory phasicity and response to augmentation. Brachial Veins: No evidence of thrombus. Normal compressibility, respiratory phasicity and response to  augmentation. Radial Veins: No evidence of thrombus. Normal compressibility, respiratory phasicity and response to augmentation. Ulnar Veins: No evidence of thrombus. Normal compressibility, respiratory phasicity and response to augmentation. Venous Reflux:  None visualized. Other Findings: PICC line in place. No thrombus seen along the course of the PICC line. Ill-defined edema within the subcutaneous soft tissues of the right arm. IMPRESSION: No evidence of DVT within the right upper extremity. Ill-defined edema within the subcutaneous soft tissues. Electronically Signed   By: Franki Cabot M.D.   On: 09/13/2016 13:18   Dg Chest Port 1 View  Result Date: 09/12/2016 CLINICAL DATA:  PICC line placement. EXAM: PORTABLE CHEST 1 VIEW COMPARISON:  Chest x-ray dated 09/09/2016. FINDINGS: Right-sided PICC line in place. Tip appears stable in position at the level of the mid SVC. Heart size and mediastinal contours are stable. Opacity at the left lung base appears stable, likely atelectasis and/or small effusion. No new lung findings. IMPRESSION: PICC line appears adequately positioned at the level of the mid SVC. Electronically Signed   By: Franki Cabot M.D.   On: 09/12/2016 13:04    Medications:  Prior to Admission:  Prescriptions Prior to Admission  Medication Sig Dispense Refill Last Dose  . apixaban (ELIQUIS) 5 MG TABS tablet Take 5 mg by mouth 2 (two) times daily.   09/09/2016 at 1800  . cloNIDine (CATAPRES) 0.1 MG tablet Take 0.1 mg by mouth 2 (two) times daily.   unknown  . docusate sodium (COLACE) 100 MG capsule Take 100 mg by mouth 2 (two) times daily.   unknown  . FLUoxetine (PROZAC) 20 MG/5ML solution Take 5 mLs (20 mg total) by mouth daily. 120 mL 3 unknown  . insulin glargine (LANTUS) 100 UNIT/ML injection Inject 0.3 mLs (30 Units total) into the skin  at bedtime. 10 mL 11 unknown  . ipratropium-albuterol (DUONEB) 0.5-2.5 (3) MG/3ML SOLN Take 3 mLs by nebulization every 6 (six) hours as needed  (wheezing).   unknown  . LORazepam (ATIVAN) 1 MG tablet Take 1 tablet (1 mg total) by mouth at bedtime. 30 tablet 0 unknown  . mupirocin cream (BACTROBAN) 2 % Apply topically 2 (two) times daily. 15 g 0 unknown  . nitroGLYCERIN (NITROSTAT) 0.4 MG SL tablet Place 1 tablet (0.4 mg total) under the tongue every 5 (five) minutes as needed for chest pain. 30 tablet 12 unknown  . oxyCODONE (ROXICODONE) 15 MG immediate release tablet Take 30 mg by mouth every 8 (eight) hours as needed for pain.    unknown  . piperacillin-tazobactam (ZOSYN) IVPB Inject 3.375 g into the vein every 8 (eight) hours. Indication:  cellulitis Last Day of Therapy:  9/12//18 Labs - Once weekly:  CBC/D and BMP, Labs - Every other week:  ESR and CRP 120 Units 0 09/09/2016 at Unknown time  . povidone-iodine (BETADINE) 10 % external solution Apply topically as needed for wound care. (Patient taking differently: Apply topically 2 (two) times daily. Soaks left foot) 480 mL 0 09/09/2016 at Unknown time  . rosuvastatin (CRESTOR) 10 MG tablet Take 10 mg by mouth daily.   unknown  . vancomycin IVPB Inject 1,000 mg into the vein daily. Indication:  cellulitis Last Day of Therapy:  09/16/16 Labs - Sunday/Monday:  CBC/D, BMP, and vancomycin trough. Labs - Thursday:  BMP and vancomycin trough Labs - Every other week:  ESR and CRP 7000 Units 0 09/09/2016 at Unknown time  . metoprolol succinate (TOPROL-XL) 25 MG 24 hr tablet Take 3 tablets (75 mg total) by mouth daily. Take with or immediately following a meal. (Patient not taking: Reported on 09/09/2016) 90 tablet 4 Not Taking at Unknown time   Scheduled: . cloNIDine  0.1 mg Oral BID  . clotrimazole   Topical q morning - 10a  . docusate sodium  100 mg Oral BID  . fluocinonide cream   Topical TID  . FLUoxetine  20 mg Oral Daily  . fluticasone  2 spray Each Nare Daily  . insulin glargine  30 Units Subcutaneous QHS  . ipratropium-albuterol  3 mL Nebulization TID  . LORazepam  1 mg Oral QHS  .  mouth rinse  15 mL Mouth Rinse BID  . metoprolol succinate  75 mg Oral Daily  . rosuvastatin  10 mg Oral Daily  . sodium chloride flush  3 mL Intravenous Q12H   Continuous: . sodium chloride 20 mL/hr at 09/13/16 2205  . piperacillin-tazobactam (ZOSYN)  IV Stopped (09/14/16 0409)  . vancomycin 1,000 mg (09/13/16 1612)   HDQ:QIWLNLGXQJJHE (TYLENOL) oral liquid 160 mg/5 mL, diphenhydrAMINE, ipratropium-albuterol, nitroGLYCERIN, oxyCODONE, sodium chloride  Assesment: He was admitted with healthcare associated pneumonia but on CT it looks more like atelectasis. He is still being treated for pneumonia which I think is appropriate. He has a foot infection and he is going to remain on Zosyn and vancomycin for that. He's using incentive spirometry and that seems to have helped open his lungs. He has multiple other medical problems including difficulty with swallowing and that's being investigated today. Active Problems:   HCAP (healthcare-associated pneumonia)   COPD (chronic obstructive pulmonary disease) (HCC)   CAD (coronary artery disease)   Hyperlipidemia   Cellulitis and abscess of toe of left foot    Plan: Continue incentive spirometry. Continue other treatments. EGD and dilatation today. His antibiotics for his  foot should help if he does indeed have pneumonia. I will plan to sign off at this point. Repeat chest x-ray should be done in about a month to make sure that he totally clears  Thanks for allowing me to see him with you    LOS: 3 days   [name] L 09/14/2016, 7:50 AM

## 2016-09-14 NOTE — Transfer of Care (Signed)
Immediate Anesthesia Transfer of Care Note  Patient: [name]  Procedure(s) Performed: Procedure(s): ESOPHAGOGASTRODUODENOSCOPY (EGD) WITH PROPOFOL (N/A) ESOPHAGEAL DILATION (N/A)  Patient Location: PACU  Anesthesia Type:MAC  Level of Consciousness: awake and alert   Airway & Oxygen Therapy: Patient Spontanous Breathing and Patient connected to nasal cannula oxygen  Post-op Assessment: Report given to RN and Post -op Vital signs reviewed and stable  Post vital signs: Reviewed and stable  Last Vitals:  Vitals:   09/14/16 0735 09/14/16 1300  BP:  (!) 159/73  Pulse:    Resp:  (!) 81  Temp:    SpO2: (!) 89% (!) 88%    Last Pain:  Vitals:   09/14/16 0656  TempSrc: Oral  PainSc:       Patients Stated Pain Goal: 1 (26/71/24 5809)  Complications: No apparent anesthesia complications

## 2016-09-14 NOTE — Anesthesia Preprocedure Evaluation (Signed)
Anesthesia Evaluation  Patient identified by MRN, date of birth, ID band Patient awake    Reviewed: Allergy & Precautions, H&P , NPO status , Patient's Chart, lab work & pertinent test results  Airway Mallampati: II  TM Distance: >3 FB Neck ROM: Full    Dental  (+) Edentulous Upper, Edentulous Lower   Pulmonary shortness of breath and with exertion, sleep apnea , COPD, former smoker,    Pulmonary exam normal breath sounds clear to auscultation       Cardiovascular hypertension, Pt. on medications + CAD, + CABG, +CHF and + DVT  negative cardio ROS  + dysrhythmias Atrial Fibrillation  Rhythm:Regular Rate:Normal     Neuro/Psych negative neurological ROS  negative psych ROS   GI/Hepatic Neg liver ROS, GERD  Medicated,  Endo/Other  diabetes, Type 2, Insulin Dependent  Renal/GU negative Renal ROS  negative genitourinary   Musculoskeletal negative musculoskeletal ROS (+)   Abdominal   Peds negative pediatric ROS (+)  Hematology negative hematology ROS (+)   Anesthesia Other Findings   Reproductive/Obstetrics negative OB ROS                             Anesthesia Physical Anesthesia Plan  ASA: IV  Anesthesia Plan: MAC   Post-op Pain Management:    Induction: Intravenous  PONV Risk Score and Plan:   Airway Management Planned: Simple Face Mask  Additional Equipment:   Intra-op Plan:   Post-operative Plan:   Informed Consent: I have reviewed the patients History and Physical, chart, labs and discussed the procedure including the risks, benefits and alternatives for the proposed anesthesia with the patient or authorized representative who has indicated his/her understanding and acceptance.     Plan Discussed with:   Anesthesia Plan Comments:         Anesthesia Quick Evaluation

## 2016-09-14 NOTE — Op Note (Signed)
[facility] Patient Name: [name] Procedure Date: 09/14/2016 12:47 PM MRN: [id number] Date of Birth: 07/14/1931 Attending MD: Norvel Richards , MD CSN: [id number] Age: 83 Admit Type: Inpatient Procedure:                Upper GI endoscopy Indications:              Dysphagia Providers:                Norvel Richards, MD, Janeece Riggers, RN, Rosina Lowenstein, RN Referring MD:              Medicines:                Propofol per Anesthesia Complications:            No immediate complications. Estimated Blood Loss:     Estimated blood loss was minimal. Procedure:                Pre-Anesthesia Assessment:                           - Prior to the procedure, a History and Physical                            was performed, and patient medications and                            allergies were reviewed. The patient's tolerance of                            previous anesthesia was also reviewed. The risks                            and benefits of the procedure and the sedation                            options and risks were discussed with the patient.                            All questions were answered, and informed consent                            was obtained. Prior Anticoagulants: The patient has                            taken no previous anticoagulant or antiplatelet                            agents. ASA Grade Assessment: III - A patient with                            severe systemic disease. After reviewing the risks  and benefits, the patient was deemed in                            satisfactory condition to undergo the procedure.                           After obtaining informed consent, the endoscope was                            passed under direct vision. Throughout the                            procedure, the patient's blood pressure, pulse, and                            oxygen saturations were monitored  continuously. The                            [id number] ([id number]) scope was introduced through the                            mouth, and advanced to the second part of duodenum.                            The upper GI endoscopy was accomplished without                            difficulty. The patient tolerated the procedure                            well. Scope In: 1:29:36 PM Scope Out: 1:35:32 PM Total Procedure Duration: 0 hours 5 minutes 56 seconds  Findings:      The examined esophagus was normal. 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      The duodenal bulb and second portion of the duodenum were normal. The       scope was withdrawn. Dilation was performed with a Maloney dilator with       no resistance at 56 Fr. The scope was withdrawn. Dilation was performed       with a Maloney dilator with mild resistance at 2 Fr. The dilation site       was examined following endoscope reinsertion and showed mild improvement       in luminal narrowing. Estimated blood loss was minimal. Impression:               - Normal esophagus. Dilated. gastric lipoma -                            chronic finding                           - Normal duodenal bulb and second portion of the  duodenum.                           - No specimens collected. Moderate Sedation:      Moderate (conscious) sedation was personally administered by an       anesthesia professional. The following parameters were monitored: oxygen       saturation, heart rate, blood pressure, respiratory rate, EKG, adequacy       of pulmonary ventilation, and response to care. Total physician       intraservice time was 11 minutes. Recommendation:           - Return patient to hospital ward for ongoing care.                           - Dysphagia 2 diet                           - Continue present medications. From a Gi                             standpoint, may resume Eliquis tomorrow Procedure Code(s):        --- Professional ---                           [phone], Esophagogastroduodenoscopy, flexible,                            transoral; diagnostic, including collection of                            specimen(s) by brushing or washing, when performed                            (separate procedure)                           43450, Dilation of esophagus, by unguided sound or                            bougie, single or multiple passes Diagnosis Code(s):        --- Professional ---                           R13.10, Dysphagia, unspecified CPT copyright 2016 [employer]. All rights reserved. The codes documented in this report are preliminary and upon coder review may  be revised to meet current compliance requirements. [name]. [name], MD Norvel Richards, MD 09/14/2016 2:13:34 PM This report has been signed electronically. Number of Addenda: 0

## 2016-09-14 NOTE — Evaluation (Signed)
Occupational Therapy Evaluation Patient Details Name: [name] MRN: [id number] DOB: [birth year] Today's Date: 09/14/2016    History of Present Illness [name] is an 83 y.o. male just discharge yesterday to SNF, having had I and D of left foot, PICC line and currently on IV Van/Zosyn, hx of HTN, HLD, CKD, brought to the ER as he has bleeding on his right dorsum of the hand and some chest pain with coughing.   He has ben on Eliquis.  The bleeding was stopped with local pressure.  CXR however showed new consolidation,  WBC at 15K, and Cr of 1.4.  Hospitalist was asked to readmit him for new developing PNA and to watch for any further bleeding.    Clinical Impression   Pt in bed sleeping upon therapy arrival. Friend and family member present during evaluation. Pt lethargic and fatigued during evaluation so transfer was not attempted. Planned EGD and possible dilation scheduled for this morning. Ordered placed to begin exercises to RUE as no DVT is present. Patient was given HEP for A/ROM shoulder exercises. Therapist reviewed with patient and all questions were answered. Patient will benefit from skilled OT services while in acute to focus on ADL performance and UB strength and endurance. Discharge to SNF is recommended with OT following.     Follow Up Recommendations  SNF    Equipment Recommendations  None recommended by OT       Precautions / Restrictions Precautions Precautions: Fall Precaution Comments: left plantar surface forefoot wound Restrictions Weight Bearing Restrictions: Yes LLE Weight Bearing: Partial weight bearing Other Position/Activity Restrictions: heel weight bearing on LLE             ADL either performed or assessed with clinical judgement                      Pertinent Vitals/Pain Pain Assessment: No/denies pain     Hand Dominance Right   Extremity/Trunk Assessment Upper Extremity Assessment Upper Extremity Assessment: Generalized  weakness   Lower Extremity Assessment Lower Extremity Assessment: Defer to PT evaluation       Communication Communication Communication: HOH   Cognition Arousal/Alertness: Lethargic Behavior During Therapy: Flat affect Overall Cognitive Status: Within Functional Limits for tasks assessed                    Exercises General Exercises - Upper Extremity Shoulder Flexion: AROM;Both;5 reps;Seated Shoulder ABduction: AROM;Both;5 reps;Seated Shoulder ADduction: AROM;Both;5 reps;Seated Shoulder Horizontal ABduction: AROM;Both;5 reps;Seated Shoulder Horizontal ADduction: AROM;Both;5 reps;Seated        Home Living Family/patient expects to be discharged to:: Skilled nursing facility Living Arrangements: Alone Available Help at Discharge: Family Type of Home: Mobile home Home Access: Stairs to enter Entrance Stairs-Number of Steps: 3 Entrance Stairs-Rails: Can reach both Home Layout: One level     Bathroom Shower/Tub: Occupational psychologist: Standard     Home Equipment: None          Prior Functioning/Environment Level of Independence: Independent        Comments: independent with ADLs, IADLs, ambulation without AD or assistance, still driving        OT Problem List: Decreased knowledge of use of DME or AE;Impaired balance (sitting and/or standing);Decreased strength      OT Treatment/Interventions: Self-care/ADL training;Modalities;Therapeutic exercise;Therapeutic activities;DME and/or AE instruction;Patient/family education;Manual therapy    OT Goals(Current goals can be found in the care plan section) Acute Rehab OT Goals Patient Stated Goal: return home  after rehab OT Goal Formulation: With patient Time For Goal Achievement: 09/28/16 Potential to Achieve Goals: Good  OT Frequency: Min 2X/week   Barriers to D/C: Decreased caregiver support  Pt lives alone          AM-PAC PT "6 Clicks" Daily Activity     Outcome Measure Help from  another person eating meals?: None Help from another person taking care of personal grooming?: A Little Help from another person toileting, which includes using toliet, bedpan, or urinal?: Total Help from another person bathing (including washing, rinsing, drying)?: Total Help from another person to put on and taking off regular upper body clothing?: Total Help from another person to put on and taking off regular lower body clothing?: Total 6 Click Score: 11   End of Session    Activity Tolerance: Patient limited by fatigue Patient left: in bed;with call bell/phone within reach;with bed alarm set;with family/visitor present  OT Visit Diagnosis: Muscle weakness (generalized) (M62.81)                Time: 4854-6270 OT Time Calculation (min): 20 min Charges:  OT General Charges $OT Visit: 1 Visit OT Evaluation $OT Eval Low Complexity: 1 Low OT Treatments $Therapeutic Exercise: 8-22 mins (10') G-Codes:     Ailene Ravel, OTR/L,CBIS  [phone]   Treanna Dumler, Clarene Duke 09/14/2016, 8:51 AM

## 2016-09-14 NOTE — Anesthesia Postprocedure Evaluation (Addendum)
Anesthesia Post Note  Patient: AADIT HAGOOD  Procedure(s) Performed: Procedure(s) (LRB): ESOPHAGOGASTRODUODENOSCOPY (EGD) WITH PROPOFOL (N/A) ESOPHAGEAL DILATION (N/A)  Patient location during evaluation: PACU Anesthesia Type: MAC Level of consciousness: awake and alert Pain management: satisfactory to patient Vital Signs Assessment: post-procedure vital signs reviewed and stable Respiratory status: spontaneous breathing Cardiovascular status: stable Postop Assessment: no signs of nausea or vomiting Anesthetic complications: no     Last Vitals:  Vitals:   09/14/16 1415 09/14/16 1430  BP: (!) 141/123 (!) 155/99  Pulse: 82 80  Resp: (!) 8 10  Temp:    SpO2: 94% 100%    Last Pain:  Vitals:   09/14/16 0800  TempSrc:   PainSc: 0-No pain                 Stephen Turnbaugh

## 2016-09-14 NOTE — Progress Notes (Signed)
Pt NPO since MN for EGD with esophageal dilation.

## 2016-09-14 NOTE — Progress Notes (Signed)
Patient briefly examined today in preparation for EGD/dilation with Dr. [name] with Propofol later this afternoon due to dysphagia.   Has COPD and seems to be near baseline for respiratory status. Undergoing treatment for possible healthcare associated pneumonia but pulmonology favoring component of atelectasis as noted on CT. Other health issues this admission including left foot cellulitis on antibiotics, anemia without overt GI bleeding with anemia panel pending (stable Hgb this morning), and GI consulted due to persistent dysphagia. Previously seen by GI while inpatient June 2018 with cardiopulmonary issues. EGD was postponed at that time due to respiratory distress in pre-op. He was to follow-up as outpatient for EGD/ED but has been readmitted in the interim. Last EGD in 2015 with empiric dilation. Known age-related esophageal dysmotility on BPE in Dec 2015. He responded well to dilation in the past. CHRONIC ANTICOAGULATION FOR AFIB, with last dose of Eliquis 09/12/16 in the morning. This morning makes 48 hours off anticoagulation.   Annitta Needs, PhD, ANP-BC [facility]

## 2016-09-14 NOTE — Anesthesia Procedure Notes (Signed)
Procedure Name: MAC Date/Time: 09/14/2016 1:21 PM Performed by: Vista Deck Pre-anesthesia Checklist: Patient identified, Emergency Drugs available, Suction available, Timeout performed and Patient being monitored Patient Re-evaluated:Patient Re-evaluated prior to induction Oxygen Delivery Method: Non-rebreather mask

## 2016-09-15 ENCOUNTER — Encounter (HOSPITAL_COMMUNITY): Payer: Self-pay | Admitting: Internal Medicine

## 2016-09-15 ENCOUNTER — Inpatient Hospital Stay
Admission: RE | Admit: 2016-09-15 | Discharge: 2016-09-24 | Disposition: A | Discharge status: Nursing Home (Includes ICF) | Payer: Medicare Other | Source: Ambulatory Visit | Attending: Internal Medicine | Admitting: Internal Medicine

## 2016-09-15 DIAGNOSIS — R1310 Dysphagia, unspecified: Secondary | ICD-10-CM

## 2016-09-15 MED ORDER — CLOTRIMAZOLE 1 % EX CREA: TOPICAL_CREAM | Freq: Every morning | CUTANEOUS | 0 refills | Status: DC

## 2016-09-15 MED ORDER — PANTOPRAZOLE SODIUM 40 MG PO TBEC: 40.0000 mg | DELAYED_RELEASE_TABLET | Freq: Every day | ORAL | 3 refills | Status: DC

## 2016-09-15 MED ORDER — PANTOPRAZOLE SODIUM 40 MG PO TBEC
40.0000 mg | DELAYED_RELEASE_TABLET | Freq: Every day | ORAL | Status: DC
  Administered 2016-09-15: 40 mg via ORAL

## 2016-09-15 NOTE — Addendum Note (Signed)
Addendum  created 09/15/16 1332 by Charmaine Downs, CRNA   Sign clinical note

## 2016-09-15 NOTE — Anesthesia Postprocedure Evaluation (Signed)
Anesthesia Post Note  Patient: [name]  Procedure(s) Performed: Procedure(s) (LRB): ESOPHAGOGASTRODUODENOSCOPY (EGD) WITH PROPOFOL (N/A) ESOPHAGEAL DILATION (N/A)  Patient location during evaluation: Nursing Unit Anesthesia Type: MAC Level of consciousness: awake and patient cooperative Pain management: pain level controlled Vital Signs Assessment: post-procedure vital signs reviewed and stable Respiratory status: spontaneous breathing, nonlabored ventilation and respiratory function stable Cardiovascular status: blood pressure returned to baseline Postop Assessment: no signs of nausea or vomiting Anesthetic complications: no     Last Vitals:  Vitals:   09/15/16 0737 09/15/16 1316  BP:    Pulse:    Resp:    Temp:    SpO2: 99% 96%    Last Pain:  Vitals:   09/15/16 1229  TempSrc:   PainSc: 0-No pain                 Jarelle Ates J

## 2016-09-15 NOTE — Progress Notes (Signed)
The date is 09/09/2016.  Level care skilled.  Facility is [facility].  Chief complaint acute visit secondary to chest pain.  History of present illness.  Patient is an 83 year old male who has just been admitted to skilled nursing from hospital.  He is here for rehabilitation and strengthening secondary to an infection of his left foot-he is also receiving IV vancomycin and Zosyn.  He does have a history of significant pain and is on oxycodone.  He also has a history of esophageal strictures which required dilation apparently 3 months ago.  He also has a history of coronary artery disease status post CABG 2011 -as well as COPD chronic kidney disease stage III hypertension and diabetes type 2.  Per patient the midline chest pain is making him uncomfortable-does report at times some GERD-like symptoms-says he has been having thisat times in the hospital with the GERD-like symptoms-he says this chest pain is making him uncomfortable  He does have a history of atrial fibrillation and is on Eliquis for anticoagulation-he is on Lopressor for rate control   Per serial checks the chest pain is persisting-vital signs appear to be stable systolic is mildly elevated at 159 O2 saturation is in the 90s  Past Medical History:  Diagnosis Date  . BPH (benign prostatic hyperplasia)   . CAD (coronary artery disease)    Multivessel status post CABG 2011 - LIMA to LAD, SVG to diagonal, SVG to OM, SVG to PDA  . Cellulitis    12/15  . Chronic back pain   . Chronic diastolic CHF (congestive heart failure) ([place])   . CKD (chronic kidney disease), stage III   . COPD (chronic obstructive pulmonary disease) ([place])   . Essential hypertension   . Gastric mass    EGD 9/15  . GERD (gastroesophageal reflux disease)   . History of DVT (deep vein thrombosis)    Postphlebitic syndrome  . History of kidney stones   . HOH (hard of hearing)   . Hx of CABG   . Hyperlipidemia   .  Persistent atrial fibrillation ([place])    a. s/p DCCV 03/2016.  [name] Sleep apnea    Stop Bang score of 5  . Type 2 diabetes mellitus ([place])          Past Surgical History:  Procedure Laterality Date  . CARDIOVERSION N/A 03/20/2016   Procedure: CARDIOVERSION;  Surgeon: Arnoldo Lenis, MD;  Location: [facility];  Service: Endoscopy;  Laterality: N/A;  . CATARACT EXTRACTION W/PHACO Left 02/07/2016   Procedure: CATARACT EXTRACTION PHACO AND INTRAOCULAR LENS PLACEMENT (IOC);  Surgeon: [name], MD;  Location: [facility] ORS;  Service: Ophthalmology;  Laterality: Left;  CDE:  23.13  . COLONOSCOPY  2004   Dr. Laural Golden: three small polyps at cecum, path unknown, external hemorrhoids  . COLONOSCOPY N/A 08/16/2013   Dr. [name]: incomplete prep. multiple tubular adenomas, multiple biopsies. Needs surveillance in Aug 2016 due to poor prep  . CORONARY ARTERY BYPASS GRAFT     x5  . CORONARY ARTERY BYPASS GRAFT  2011  . CYSTOSCOPY N/A 12/12/2012   Procedure: CYSTOSCOPY FLEXIBLE;  Surgeon: Marissa Nestle, MD;  Location: [facility] OR[facility];  Service: Urology;  Laterality: N/A;  . ESOPHAGOGASTRODUODENOSCOPY N/A 08/16/2013   Dr. [name]: normal esophagus s/p Maloney dilation, gastric erosions, submucosal gastric mass vs extrinsic mass  . EUS N/A 09/07/2013   Dr. 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 INCISION AND DRAINAGE ABSCESS N/A 12/20/2013  Procedure: INCISION AND DRAINAGE ABSCESS NECK;  Surgeon: Jamesetta So, MD;  Location: [facility];  Service: General;  Laterality: N/A;  Venia Minks DILATION N/A 08/16/2013   Procedure: Keturah Shavers;  Surgeon: Daneil Dolin, MD;  Location: [facility];  Service: Endoscopy;  Laterality: N/A;    Social History   Social History  . Marital status: Single    Spouse name: N/A  . Number of children: N/A  . Years of education: N/A       Occupational History  . Retired         Social History Main Topics  . Smoking status:  Former Smoker    Packs/day: 1.00    Years: 11.00    Quit date: 01/05/1956  . Smokeless tobacco: Never Used  . Alcohol use No  . Drug use: No  . Sexual activity: No       Other Topics Concern  . Not on file      Social History Narrative   ** Merged History Encounter **        No Known Allergies       Family History  Problem Relation Age of Onset  . Colon cancer Son 77       deceased     Medications Medication Sig Start Date End Date Taking? Authorizing Provider  apixaban (ELIQUIS) 5 MG TABS tablet Take 5 mg by mouth 2 (two) times daily.   Yes [provider]  cloNIDine (CATAPRES) 0.1 MG tablet Take 0.1 mg by mouth 2 (two) times daily.   Yes [provider]  docusate sodium (COLACE) 100 MG capsule Take 100 mg by mouth 2 (two) times daily.   Yes [provider]  FLUoxetine (PROZAC) 20 MG/5ML solution Take 5 mLs (20 mg total) by mouth daily. 09/09/16  Yes [name], MD  insulin glargine (LANTUS) 100 UNIT/ML injection Inject 0.3 mLs (30 Units total) into the skin at bedtime. 09/08/16  Yes Dondiego, Richard, MD  ipratropium-albuterol (DUONEB) 0.5-2.5 (3) MG/3ML SOLN Take 3 mLs by nebulization every 6 (six) hours as needed (wheezing).   Yes [provider]  LORazepam (ATIVAN) 1 MG tablet Take 1 tablet (1 mg total) by mouth at bedtime. 09/08/16 10/08/16 Yes Dondiego, Delfino Lovett, MD  mupirocin cream (BACTROBAN) 2 % Apply topically 2 (two) times daily. 09/08/16  Yes [name], MD  nitroGLYCERIN (NITROSTAT) 0.4 MG SL tablet Place 1 tablet (0.4 mg total) under the tongue every 5 (five) minutes as needed for chest pain. 12/22/13  Yes Dondiego, Delfino Lovett, MD  oxyCODONE (ROXICODONE) 15 MG immediate release tablet Take 30 mg by mouth every 8 (eight) hours as needed for pain.    Yes [provider]  piperacillin-tazobactam (ZOSYN) IVPB Inject 3.375 g into the vein every 8 (eight) hours. Indication:  cellulitis Last  Day of Therapy:  9/12//18 Labs - Once weekly:  CBC/D and BMP, Labs - Every other week:  ESR and CRP 09/08/16 09/15/16 Yes Dondiego, Richard, MD  povidone-iodine (BETADINE) 10 % external solution Apply topically as needed for wound care. Patient taking differently: Apply topically 2 (two) times daily. Soaks left foot 09/08/16  Yes Dondiego, Richard, MD  rosuvastatin (CRESTOR) 10 MG tablet Take 10 mg by mouth daily.   Yes [provider]  vancomycin IVPB Inject 1,000 mg into the vein daily. Indication:  cellulitis Last Day of Therapy:  09/16/16 Labs - Sunday/Monday:  CBC/D, BMP, and vancomycin trough. Labs - Thursday:  BMP and vancomycin trough Labs - Every other  week:  ESR and CRP 09/08/16 09/15/16 Yes Dondiego, Richard, MD  metoprolol succinate (TOPROL-XL) 25 MG 24 hr tablet Take 3 tablets (75 mg total) by mouth daily. Take with or immediately following a meal. Patient not taking: Reported on 09/09/2016 06/16/16   [name], MD    Review of systems.  In general is complaining of midline chest pain denies radiation-does not complain of fever or chills.  Skin is not complaining of rashes or itching that has a significant left foot wound as noted above.  Head ears eyes nose mouth and throat does not complain of visual changes does report at times difficulty swallowing.  Respiratory says he is having some shortness of breath has intermittent cough  Cardiac is complaining of midline chest pain with no radiation.  GI is not complaining of abdominal discomfort nausea or vomiting.  Muscle skeletal does have significant pain especially in regards to his left lower extremity is not acutely complaining of black currently.  Neurologic does not complain of dizziness headache or syncopal-type feelings.  Psych appears to have some history of anxiety.  Physical exam.  Temperature is 98.4 pulse 83 respirations 20 blood pressure 159/80 O2 saturation is in the 90s on room air  In  general this is a pleasant elderly male who appears to be mildly anxious he is not in acute distress.  His skin is warm and dry he does have a well-healed midline surgical scar also covering of his left foot wound. He also has been wrapping of his right hand apparently earlier hadbeen significant bleeding from that area  Eyes visual acuity appears grossly intact sclera and conjunctiva are clear.  Oropharynx is clear mucous membranes moist.  Chest is clear to auscultation with shallow air entry there is no labored breathing.  Heart is regular irregular rate and rhythm without murmur gallop or rub.  Abdomen is soft nontender with positive bowel sounds.  Musculoskeletal limited exam since he is in bed but can move his upper extremities has what appears to be lower extremity weakness complicated with his left foot wound which is currently bandaged.  Neurologic appears grossly intact to speech is clear no lateralizing findings and limited exam since he is in bed.  Psych he is alert and oriented pleasant and appropriate bit anxious   Labs.  09/07/2016.  Sodium 137 potassium 3.5 BUN 15 creatinine 1.16.  WBC 16.2 hemoglobin 10.4 platelets 324.  Assessment plan.  #1 persistent chest pain-patient says he has had some midline chest discomfort in the hospital thought this was more GERD related-however the chest pain is making him uncomfortable this evening and it is persisting --will send him to the ER for expedient evaluation.  Vital signs appear to be stable-- per serial checks before EMS arrived  appear to be stable with stable physical exam-- and actually he said chest pain was slightly easing off by the time EMS arrived-again will await ER evaluation.  [id number]

## 2016-09-15 NOTE — Progress Notes (Signed)
Pharmacy Antibiotic Note  [name] is a 83 y.o. male admitted on 09/09/2016 with cellulitis and  pneumonia.  Pharmacy has been consulted for Vancomycin and Zosyn dosing.  Plan: Continue Vancomycin 1000mg  IV q24hrs,  Check trough this PM.  Likly Continue Zosyn 3.375gm IV q8h F/U cxs and clinical progress Monitor V/S, labs, and levels as indicated  Height: 5\' 11"  (180.3 cm) Weight: 215 lb 9.6 oz (97.8 kg) IBW/kg (Calculated) : 75.3  Temp (24hrs), Avg:98.8 F (37.1 C), Min:98.6 F (37 C), Max:99 F (37.2 C)   Recent Labs Lab 09/09/16 2115 09/10/16 0648 09/10/16 1514 09/11/16 0611 09/11/16 1720 09/12/16 0629 09/12/16 0935 09/13/16 0647  WBC 15.0* 12.3* 14.6*  --   --  12.4*  --  10.7*  CREATININE 1.34* 1.12  --  1.11  --  1.26*  --  1.42*  VANCOTROUGH  --   --   --   --  37*  --   --   --   VANCORANDOM  --   --   --   --   --   --  20  --     Estimated Creatinine Clearance: 47 mL/min (A) (by C-G formula based on SCr of 1.42 mg/dL (H)).  No Known Allergies  Antimicrobials this admission: Vancomycin 8/28 >>  Cefepime 9/6 >> 9/7 Zosyn 8/28>>9/5, 9/7 >>  Microbiology results: 8/31  Foot cx: E.Faecalis s- amp and  vanc   K. Oxytoc s- cefepime, ceftriaxone R- Amp 8/20 MRSA PCR: neg  Pharmacokinetic dosing service  Objective:  Trough level 10-15 mcg/ml ====================   Weight: 92 Kilograms   Vancomycin single level analysis:  Current dose being given: 1000 mg Current dosing interval:  24 hrs,  Current infusion time (hrs): 1  Single level Trough Data:  RANDOM level obtained: 20 mcg/ml,  - Number of hrs since last dose: 16 Hrs  Assessment: ==================== Estimated PK Parameters: --------------------------- New rate constant (kel): 0.036 hr-1 half-life: 19.25 Hours Vd from levels: 64.40  Liters  (0.7 L/kg)  Recommendations: ==================== Give Vancomycin  1000 mg  q 24 hrs. Infuse over 1 hrs Expected Cpeak: 26.4 mcg/ml    Expected  Ctrough: 11.5 mcg/ml  Recommended labs and intervals: Measure Bun and Scr 3 times/week.   Thank you for the consult, will continue to follow. Thank you for allowing pharmacy to be a part of this patient's care.  Hart Robinsons, PharmD Clinical Pharmacist Pager:  [phone] 09/15/2016   09/15/2016 10:12 AM

## 2016-09-15 NOTE — Clinical Social Work Note (Signed)
Patient discharged from [facility] on 09/08/16  and returned on 09/09/16. Patient was in [facility] for rehab and IV antibiotics.  Patient can return.  Kerri at [facility] notified of patient's discharge. Clinicals sent via [employer]. Velva Harman, patient's daughter notified of discharge.  LCSW signing off.   Clinical Social Work Assessment  Patient Details  Name: [name] MRN: [id number] Date of Birth: 1931-12-15  Date of referral:  09/04/16               Reason for consult:  Discharge Planning                           Permission sought to share information with:    Permission granted to share information::                Name::                   Agency::                Relationship::                Contact Information:  Family and friends were bedside (daughter, Velva Harman; Jackquline Berlin; and another friend).  Housing/Transportation Living arrangements for the past 2 months:  [place] of Information:  Patient, Adult Children Patient Interpreter Needed:  None Criminal Activity/Legal Involvement Pertinent to Current Situation/Hospitalization:  No - Comment as needed Significant Relationships:  Adult Children, Friend Lives with:  Self Do you feel safe going back to the place where you live?  Yes Need for family participation in patient care:  Yes (Comment)  Care giving concerns:  None identified at baseline.    Social Worker assessment / plan:  At baseline, patient was independent in all ADLs, drove his vehicle and ambulated independently.  He stated that before he stepped on the bolt he continued to paint. Patient states that will go to short term SNF.  His choices are [facility] and [facility].   Employment status:  Retired Nurse, adult PT Recommendations:  [place] / Referral to community resources:  [place]  Patient/Family's Response to care: Patient is agreeable to SNF.   Patient/Family's Understanding of  and Emotional Response to Diagnosis, Current Treatment, and Prognosis:  Patient states that she understands his diagnosis, treatment and prognosis and hopes that SNF will help him get back to his baseline quickly.   Emotional Assessment Appearance:  Appears stated age Attitude/Demeanor/Rapport:    Affect (typically observed):  Accepting, Calm Orientation:  Oriented to Situation, Oriented to  Time, Oriented to Place, Oriented to Self Alcohol / Substance use:    Psych involvement (Current and /or in the community):     Discharge Needs  Concerns to be addressed:  Discharge Planning Concerns Readmission within the last 30 days:  Yes Current discharge risk:  None Barriers to Discharge:  No Barriers Identified   Ihor Gully, LCSW 09/04/2016, 2:22 PM

## 2016-09-15 NOTE — Care Management Important Message (Signed)
Important Message  Patient Details  Name: [name] MRN: [id number] Date of Birth: [birth year]   Medicare Important Message Given:  Yes    Makhia Vosler, Chauncey Reading, RN 09/15/2016, 8:53 AM

## 2016-09-15 NOTE — Progress Notes (Signed)
PT Cancellation Note  Patient Details Name: [name] MRN: [id number] DOB: [birth year]   Cancelled Treatment:    Reason Eval/Treat Not Completed: Other (comment).  Has plan to get bandage on foot changed then leaving imminently with EMS to SNF.  Will hold visit and see tomorrow if he is canceled for some reason.   Ramond Dial 09/15/2016, 2:46 PM   2:47 PM, 09/15/16 Mee Hives, PT, MS Physical Therapist - [place] [phone] (Office)

## 2016-09-15 NOTE — Progress Notes (Signed)
PT refused wound care to hand.

## 2016-09-15 NOTE — Discharge Summary (Signed)
Physician Discharge Summary  [name] HLK:[id number] DOB: 08-30-1931 DOA: 09/09/2016  PCP: [name], MD  Admit date: 09/09/2016 Discharge date: 09/15/2016   Recommendations for Outpatient Follow-up: The patient is instructed to transfer to skilled nursing facility where upon he will undergo an additional weeks therapy with vancomycin 1000 mg IV daily according to protocol pharmacy as well as Zosyn 3.375 mg IV every 8 hours for 7 additional days he is also to have Betadine soaks to left foot due to foot cellulitis after stepping on nail being a diabetic patient is to have a soft mechanical diet as he just had dilatation of an esophageal ring 1 day prior to discharge the patient likewise has chronic atrial fibrillation coronary artery disease status post CABG 4 hypertension hyperlipidemia insulin-dependent diabetes diabetic foot ulcer from stepping on a nail which is slow to heal who was admitted to the hospitalist time with what I determine atelectasis of his left lower lobe he was termed healthcare acquired pneumonia anyway it was treated as such consultation with pulmonology. Repeat surgical consultation with Dr. Aviva Signs who performed an IND upon his foot on his previous hospitalization approximately 10 days ago his hypertension hyperlipidemia and glycemic control is well regulated he had a history of acute on chronic renal failure due to intravascular volume depletion and acute necrosis which resolved his baseline renal function shows a creatinine of 1.42 at present. He is to go to skilled nursing facility to gain total body strengthening ambulation steadiness of gait so that he may return home he is quite an independent gentleman in a [place] veteran who is been through severe injuries in his younger years he is currently 83 years old low. Discharge Diagnoses:  Active Problems:   HCAP (healthcare-associated pneumonia)   COPD (chronic obstructive pulmonary disease) (HCC)   CAD  (coronary artery disease)   Hyperlipidemia   Cellulitis and abscess of toe of left foot   Discharge Condition: Good  Filed Weights   09/12/16 0500 09/14/16 0656 09/15/16 0700  Weight: 92.8 kg (204 lb 9.6 oz) 98.4 kg (216 lb 14.9 oz) 97.8 kg (215 lb 9.6 oz)    History of present illness: See discharge instructions above for narrative the patient is an 83 white male insulin minute diabetic hypertensive hyperlipidemic with known coronary artery disease status post CABG 4 who was admitted after stepping on a nail having left foot cellulitis was treated with multiple antibiotics seen by surgical consultation subsequent sent to skilled nursing facility within 48 hours ascend back with atelectasis which was also termed healthcare acquired pneumonia was treated for such on the guidance of pulmonology Dr. Luan Pulling he had some chronic dysfunction which was attended to by Dr. Melony Overly who did an esophageal stricture dilatation and it was seen again by surgery who recommended vancomycin and Zosyn and Betadine soaks twice a day to his left foot for an additional week post discharge and to skilled [facility] he is hemodynamically stable he is in good glycemic control his creatinine is down to baseline at 1.4 to he has normal systolic function based on echocardiogram with some diastolic dysfunction in the form of [place] Hospital Course:  See history of present illness above  Procedures:  Surgical IND of left foot for drainage of abscess as well as esophageal dilatation 1 day prior to discharge  Consultations:  Pulmonary Dr. Luan Pulling, surgery Dr. Aviva Signs, gastroenterology Dr. Melony Overly and work and physical therapy  Discharge Instructions  Discharge Instructions  Discharge instructions    Complete by:  As directed    Discharge patient    Complete by:  As directed    Discharge disposition:  03-Skilled [facility]   Discharge patient date:  09/15/2016     Allergies as of 09/15/2016     No Known Allergies     Medication List    STOP taking these medications   povidone-iodine 10 % external solution Commonly known as:  BETADINE     TAKE these medications   cloNIDine 0.1 MG tablet Commonly known as:  CATAPRES Take 0.1 mg by mouth 2 (two) times daily.   clotrimazole 1 % cream Commonly known as:  LOTRIMIN Apply topically every morning.   docusate sodium 100 MG capsule Commonly known as:  COLACE Take 100 mg by mouth 2 (two) times daily.   ELIQUIS 5 MG Tabs tablet Generic drug:  apixaban Take 5 mg by mouth 2 (two) times daily.   FLUoxetine 20 MG/5ML solution Commonly known as:  PROZAC Take 5 mLs (20 mg total) by mouth daily.   insulin glargine 100 UNIT/ML injection Commonly known as:  LANTUS Inject 0.3 mLs (30 Units total) into the skin at bedtime.   ipratropium-albuterol 0.5-2.5 (3) MG/3ML Soln Commonly known as:  DUONEB Take 3 mLs by nebulization every 6 (six) hours as needed (wheezing).   LORazepam 1 MG tablet Commonly known as:  ATIVAN Take 1 tablet (1 mg total) by mouth at bedtime.   metoprolol succinate 25 MG 24 hr tablet Commonly known as:  TOPROL-XL Take 3 tablets (75 mg total) by mouth daily. Take with or immediately following a meal.   mupirocin cream 2 % Commonly known as:  BACTROBAN Apply topically 2 (two) times daily.   nitroGLYCERIN 0.4 MG SL tablet Commonly known as:  NITROSTAT Place 1 tablet (0.4 mg total) under the tongue every 5 (five) minutes as needed for chest pain.   oxyCODONE 15 MG immediate release tablet Commonly known as:  ROXICODONE Take 30 mg by mouth every 8 (eight) hours as needed for pain.   pantoprazole 40 MG tablet Commonly known as:  PROTONIX Take 1 tablet (40 mg total) by mouth daily.   piperacillin-tazobactam IVPB Commonly known as:  ZOSYN Inject 3.375 g into the vein every 8 (eight) hours. Indication:  cellulitis Last Day of Therapy:  9/12//18 Labs - Once weekly:  CBC/D and BMP, Labs - Every other week:   ESR and CRP   rosuvastatin 10 MG tablet Commonly known as:  CRESTOR Take 10 mg by mouth daily.   vancomycin IVPB Inject 1,000 mg into the vein daily. Indication:  cellulitis Last Day of Therapy:  09/16/16 Labs - Sunday/Monday:  CBC/D, BMP, and vancomycin trough. Labs - Thursday:  BMP and vancomycin trough Labs - Every other week:  ESR and CRP            Discharge Care Instructions        Start     Ordered   09/16/16 0000  clotrimazole (LOTRIMIN) 1 % cream   Every morning - 10a     09/15/16 1405   09/16/16 0000  pantoprazole (PROTONIX) 40 MG tablet  Daily     09/15/16 1415   09/15/16 0000  Discharge instructions     09/15/16 1405   09/15/16 0000  Discharge patient    Question Answer Comment  Discharge disposition 03-Skilled Nursing Facility   Discharge patient date 09/15/2016      09 /11/18 1405     No Known  Allergies    The results of significant diagnostics from this hospitalization (including imaging, microbiology, ancillary and laboratory) are listed below for reference.    Significant Diagnostic Studies: Dg Chest 2 View  Result Date: 09/09/2016 CLINICAL DATA:  Central chest pain on off for 4 weeks. History of CHF, diabetes, hypertension, coronary artery disease, gastroesophageal reflux disease, DVT, COPD, atrial fibrillation, chronic kidney disease, coronary artery bypass. EXAM: CHEST  2 VIEW COMPARISON:  09/01/2016 FINDINGS: Postoperative changes in the mediastinum. Cardiac enlargement without significant vascular congestion. Increasing interstitial pattern to the lung bases particularly on the left may indicate developing interstitial edema or pneumonia. Small left pleural effusion has developed since previous study. Atelectasis or infiltration in the left lung base is also developing. Calcification of the aorta. No pneumothorax. Right PICC line with tip over the low SVC region. Degenerative changes in the spine and shoulders. IMPRESSION: Cardiac enlargement. Developing  consolidation or atelectasis in the left lung base with new small left pleural effusion and increasing bibasilar interstitial changes. Electronically Signed   By: Lucienne Capers M.D.   On: 09/09/2016 21:08   Dg Chest 2 View  Result Date: 09/01/2016 CLINICAL DATA:  Generalized abdominal and chest pain. EXAM: CHEST  2 VIEW COMPARISON:  08/27/2016. FINDINGS: Stable cardiomegaly with aortic atherosclerosis. Status post CABG. Mild interstitial edema with atelectasis at the lung bases. No pneumonic consolidation, effusion or pneumothorax. No acute osseous abnormality. IMPRESSION: 1. Stable cardiomegaly with aortic atherosclerosis and post CABG change. 2. Bibasilar atelectasis with minimal interstitial edema. Electronically Signed   By: Ashley Royalty M.D.   On: 09/01/2016 14:28   Dg Chest 2 View  Result Date: 08/27/2016 CLINICAL DATA:  Shortness of breath, hypertension, history of coronary artery disease and previous COPD. Remote history of smoking. EXAM: CHEST  2 VIEW COMPARISON:  PA and lateral chest x-ray of August 20, 2016 FINDINGS: Today's study is obtained in a lordotic fashion. The lungs are reasonably well inflated. The interstitial markings remain increased. The cardiac silhouette remains enlarged. The pulmonary vascularity is slightly less engorged. There is calcification in the wall of the aortic arch. The sternal wires are intact. A small amount of pleural fluid is likely present on the right. IMPRESSION: CHF with mild interstitial edema stable to slightly improved since the previous study. Previous CABG.  Thoracic aortic atherosclerosis. Electronically Signed   By: David  Martinique M.D.   On: 08/27/2016 11:31   Dg Chest 2 View  Result Date: 08/20/2016 CLINICAL DATA:  Chills. EXAM: CHEST  2 VIEW COMPARISON:  07/24/2016 FINDINGS: Prior CABG. Cardiomegaly with vascular congestion and diffuse interstitial/alveolar opacities, likely edema/ CHF. No effusions or acute bony abnormality. IMPRESSION: Mild CHF.  Electronically Signed   By: Rolm Baptise M.D.   On: 08/20/2016 10:57   Ct Chest W Contrast  Result Date: 09/11/2016 CLINICAL DATA:  83 year old male with intermittent chest pain. Unresolved pneumonia. EXAM: CT CHEST WITH CONTRAST TECHNIQUE: Multidetector CT imaging of the chest was performed during intravenous contrast administration. CONTRAST:  32m ISOVUE-300 IOPAMIDOL (ISOVUE-300) INJECTION 61% COMPARISON:  Chest radiographs 09/09/2016 and earlier. Chest CTs 06/10/2016 and 01/11/2015 FINDINGS: Cardiovascular: Prior CABG. Stable cardiomegaly. No pericardial effusion. Calcified aortic atherosclerosis. Mediastinum/Nodes: Stable small mediastinal lymph nodes. No lymphadenopathy. Stable and negative for age thyroid. Lungs/Pleura: Major airways are patent, with no residual retained secretions which were visible in the trachea in June. Layering small to moderate bilateral pleural effusions are progressed on the left and new on the right since June. Associated left greater than right confluent  lower lobe opacity has also progressed, and there are associated air bronchograms, but the affected lung appears to be enhancing, favoring atelectasis over consolidation. Mild additional superimposed bilateral dependent and compressive atelectasis. No superimposed pulmonary nodularity. Upper Abdomen: Stable visualized upper abdominal viscera with no significant abnormality. Musculoskeletal: Osteopenia. Prior sternotomy. No thoracic vertebral fracture or acute osseous finding identified. IMPRESSION: 1. New/increased bilateral layering pleural effusions since June, small to moderate and greater on the left. 2. Associated increased and confluent bilateral lower lobe opacity, greater on the left, with air bronchograms. Burtis Junes this is atelectasis, although bilateral lower lobe pneumonia is difficult to exclude. 3. Otherwise stable chest including cardiomegaly, Aortic Atherosclerosis (ICD10-I70.0), prior CABG. Electronically Signed    By: Genevie Ann M.D.   On: 09/11/2016 10:10   US Renal  Result Date: 08/24/2016 CLINICAL DATA:  Acute on chronic renal failure. History of BPH, hypertension, diabetes, coronary artery disease. EXAM: RENAL / URINARY TRACT ULTRASOUND COMPLETE COMPARISON:  CT scan of the chest and abdomen of June 10, 2016 FINDINGS: Right Kidney: Length: 13.8 cm. The renal cortical echotexture is approximately equal to that of the liver. There is no hydronephrosis nor cystic or solid mass. Left Kidney: Length: 12.8 cm. The renal cortical echotexture is similar to that on the right. There is no cystic or solid mass nor hydronephrosis. I cannot exclude a small amount of perinephric fluid on the left. Bladder: Appears normal for degree of bladder distention. IMPRESSION: Increased renal cortical echotexture consistent with medical renal disease. There is no hydronephrosis. There may be a small amount of perinephric fluid on the left. Normal appearance of the partially distended urinary bladder. Electronically Signed   By: David  Martinique M.D.   On: 08/24/2016 09:48   Mr Foot Left Wo Contrast  Result Date: 08/20/2016 CLINICAL DATA:  Diabetic patient suffered a puncture wound on the plantar surface of the left foot 1 day ago. Fever and chills. EXAM: MRI OF THE LEFT FOOT WITHOUT CONTRAST TECHNIQUE: Multiplanar, multisequence MR imaging of the left foot was performed. No intravenous contrast was administered. COMPARISON:  Plain films left foot earlier today. FINDINGS: Bones/Joint/Cartilage There is no bone marrow signal abnormality to suggest osteomyelitis. No joint effusion is seen. There is no fracture or stress change. Ligaments Intact. Muscles and Tendons Intact. Soft tissues No fluid collection. Small focus of susceptibility artifact is seen on the plantar surface of the foot in the cutaneous tissues just lateral to the third MTP joint. IMPRESSION: Negative for abscess, osteomyelitis or septic joint. Small focus of susceptibility artifact  in the cutaneous tissues on the plantar surface of the foot just medial to the third MTP joint is consistent with the presence of a metallic foreign body which could be microscopic. Note is made that no foreign body is seen on the prior plain films. Electronically Signed   By: Inge Rise M.D.   On: 08/20/2016 15:58   US Venous Img Upper Uni Right  Result Date: 09/13/2016 CLINICAL DATA:  Right upper extremity pain with hand edema for 2 weeks. Right-sided PICC line in place. EXAM: RIGHT UPPER EXTREMITY VENOUS DOPPLER ULTRASOUND TECHNIQUE: Gray-scale sonography with graded compression, as well as color Doppler and duplex ultrasound were performed to evaluate the upper extremity deep venous system from the level of the subclavian vein and including the jugular, axillary, basilic, radial, ulnar and upper cephalic vein. Spectral Doppler was utilized to evaluate flow at rest and with distal augmentation maneuvers. COMPARISON:  None. FINDINGS: Contralateral Subclavian Vein: Respiratory phasicity is  normal and symmetric with the symptomatic side. No evidence of thrombus. Normal compressibility. Internal Jugular Vein: No evidence of thrombus. Normal compressibility, respiratory phasicity and response to augmentation. Subclavian Vein: No evidence of thrombus. Normal compressibility, respiratory phasicity and response to augmentation. Axillary Vein: No evidence of thrombus. Normal compressibility, respiratory phasicity and response to augmentation. Cephalic Vein: No evidence of thrombus. Normal compressibility, respiratory phasicity and response to augmentation. Basilic Vein: No evidence of thrombus. Normal compressibility, respiratory phasicity and response to augmentation. Brachial Veins: No evidence of thrombus. Normal compressibility, respiratory phasicity and response to augmentation. Radial Veins: No evidence of thrombus. Normal compressibility, respiratory phasicity and response to augmentation. Ulnar Veins: No  evidence of thrombus. Normal compressibility, respiratory phasicity and response to augmentation. Venous Reflux:  None visualized. Other Findings: PICC line in place. No thrombus seen along the course of the PICC line. Ill-defined edema within the subcutaneous soft tissues of the right arm. IMPRESSION: No evidence of DVT within the right upper extremity. Ill-defined edema within the subcutaneous soft tissues. Electronically Signed   By: Franki Cabot M.D.   On: 09/13/2016 13:18   US Arterial Seg Single  Result Date: 09/02/2016 CLINICAL DATA:  83 year old male with left foot puncture wound and cellulitis. EXAM: NONINVASIVE PHYSIOLOGIC VASCULAR STUDY OF BILATERAL LOWER EXTREMITIES TECHNIQUE: Evaluation of both lower extremities was performed at rest, including calculation of ankle-brachial indices, multiple segmental pressure evaluation, segmental Doppler and segmental pulse volume recording. COMPARISON:  None. FINDINGS: Right ABI:  1.0 Left ABI:  1.2 Right Lower Extremity: Normal posterior tibial arterial waveform. The dorsalis pedis waveform is slightly abnormal suggesting an element of anterior tibial or small vessel disease. Left Lower Extremity: Normal posterior tibial waveform. The dorsalis pedis waveform is slightly abnormal suggesting anterior tibial or small vessel disease. IMPRESSION: Normal bilateral resting ankle-brachial indices. Signed, Criselda Peaches, MD [facility] Electronically Signed   By: Jacqulynn Cadet M.D.   On: 09/02/2016 11:10   Dg Chest Port 1 View  Result Date: 09/12/2016 CLINICAL DATA:  PICC line placement. EXAM: PORTABLE CHEST 1 VIEW COMPARISON:  Chest x-ray dated 09/09/2016. FINDINGS: Right-sided PICC line in place. Tip appears stable in position at the level of the mid SVC. Heart size and mediastinal contours are stable. Opacity at the left lung base appears stable, likely atelectasis and/or small effusion. No new  lung findings. IMPRESSION: PICC line appears adequately positioned at the level of the mid SVC. Electronically Signed   By: Franki Cabot M.D.   On: 09/12/2016 13:04   Dg Foot Complete Left  Result Date: 09/01/2016 CLINICAL DATA:  Left foot redness. EXAM: LEFT FOOT - COMPLETE 3+ VIEW COMPARISON:  08/20/2016 FINDINGS: There is no evidence of fracture or dislocation. There is no periosteal reaction or bone destruction. There soft tissue emphysema between the third and fourth metatarsals concerning for infection. IMPRESSION: 1. No evidence of osteomyelitis of the left foot. 2. Soft tissue emphysema between the third and fourth metatarsals concerning for necrotizing infection. Electronically Signed   By: Kathreen Devoid   On: 09/01/2016 14:29   Dg Foot Complete Left  Result Date: 08/20/2016 CLINICAL DATA:  Removal of foreign body.  Chills and pain. EXAM: LEFT FOOT - COMPLETE 3+ VIEW COMPARISON:  No recent . FINDINGS: Mild diffuse soft tissue swelling. Peripheral vascular calcification. No radiopaque foreign body. Diffuse degenerative change. No acute bony abnormality identified. No evidence of fracture or dislocation. If osteomyelitis is of concern MRI can be obtained IMPRESSION: 1. Mild  diffuse soft tissue swelling. Peripheral vascular disease. No radiopaque foreign body. 2. No acute or focal bony abnormality identified. Diffuse degenerative disease. Electronically Signed   By: Marcello Moores  Register   On: 08/20/2016 10:12    Microbiology: No results found for this or any previous visit (from the past 240 hour(s)).   Labs: Basic Metabolic Panel:  Recent Labs Lab 09/09/16 2115 09/10/16 0648 09/11/16 0611 09/12/16 0629 09/13/16 0647  NA 134* 135 135 136 135  K 3.7 3.6 3.7 4.0 3.9  CL 96* 97* 98* 96* 97*  CO2 31 33* 33* 33* 33*  GLUCOSE 190* 154* 170* 173* 127*  BUN 10 10 11 12 12   CREATININE 1.34* 1.12 1.11 1.26* 1.42*  CALCIUM 9.6 9.7 9.7 10.1 9.8   Liver Function Tests:  Recent Labs Lab  09/10/16 0648  AST 16  ALT 22  ALKPHOS 142*  BILITOT 0.5  PROT 5.5*  ALBUMIN 2.2*   No results for input(s): LIPASE, AMYLASE in the last 168 hours. No results for input(s): AMMONIA in the last 168 hours. CBC:  Recent Labs Lab 09/09/16 2115 09/10/16 0648 09/10/16 1514 09/12/16 0629 09/13/16 0647 09/14/16 0614  WBC 15.0* 12.3* 14.6* 12.4* 10.7*  --   NEUTROABS  --   --  11.8* 9.0* 7.7  --   HGB 8.8* 8.5* 8.7* 8.3* 7.7* 8.2*  HCT 27.5* 26.3* 26.8* 26.1* 24.2* 26.1*  MCV 84.1 85.1 84.5 86.7 87.1  --   PLT 299 268 289 314 297  --    Cardiac Enzymes:  Recent Labs Lab 09/09/16 2115 09/10/16 0155 09/10/16 0648 09/10/16 1319  TROPONINI <0.03 <0.03 <0.03 <0.03   BNP: BNP (last 3 results)  Recent Labs  06/08/16 1133 07/24/16 1006 09/09/16 2115  BNP 1,759.0* 251.0* 404.0*    ProBNP (last 3 results) No results for input(s): PROBNP in the last 8760 hours.  CBG:  Recent Labs Lab 09/14/16 0721 09/14/16 1111 09/14/16 1234 09/14/16 1352 09/14/16 1633  GLUCAP 95 91 88 115* 95       Signed:  [facility] Pager: [phone] 09/15/2016, 2:22 PM

## 2016-09-15 NOTE — Progress Notes (Signed)
Pt discharged to [facility]. Called report to [facility] nurse taking patient. Pt sent with all personal belongings and prescriptions.

## 2016-09-15 NOTE — Progress Notes (Signed)
REVIEWED-NO ADDITIONAL RECOMMENDATIONS.   Subjective: Feels improved from swallowing standpoint. Tolerated diet yesterday evening. No N/V. Asking when he can go home.   Objective: Vital signs in last 24 hours: Temp:  [98.6 F (37 C)-99 F (37.2 C)] 98.9 F (37.2 C) (09/11 0459) Pulse Rate:  [78-86] 84 (09/11 0459) Resp:  [8-81] 18 (09/11 0459) BP: (135-159)/(72-123) 146/100 (09/11 0459) SpO2:  [88 %-100 %] 99 % (09/11 0737) Weight:  [215 lb 9.6 oz (97.8 kg)] 215 lb 9.6 oz (97.8 kg) (09/11 0700) Last BM Date: 09/13/16 General:   Alert and oriented, pleasant Head:  Normocephalic and atraumatic. Abdomen:  Bowel sounds present, distended but soft, obese.  Neurologic:  Alert and  oriented x4 Psych:  Alert and cooperative. Normal mood and affect.  Intake/Output from previous day: 09/10 0701 - 09/11 0700 In: 933 [P.O.:480; I.V.:303; IV Piggyback:150] Out: 200 [Urine:200] Intake/Output this shift: No intake/output data recorded.  Lab Results:  Recent Labs  09/13/16 0647 09/14/16 0614  WBC 10.7*  --   HGB 7.7* 8.2*  HCT 24.2* 26.1*  PLT 297  --    BMET  Recent Labs  09/13/16 0647  NA 135  K 3.9  CL 97*  CO2 33*  GLUCOSE 127*  BUN 12  CREATININE 1.42*  CALCIUM 9.8     Studies/Results: US Venous Img Upper Uni Right  Result Date: 09/13/2016 CLINICAL DATA:  Right upper extremity pain with hand edema for 2 weeks. Right-sided PICC line in place. EXAM: RIGHT UPPER EXTREMITY VENOUS DOPPLER ULTRASOUND TECHNIQUE: Gray-scale sonography with graded compression, as well as color Doppler and duplex ultrasound were performed to evaluate the upper extremity deep venous system from the level of the subclavian vein and including the jugular, axillary, basilic, radial, ulnar and upper cephalic vein. Spectral Doppler was utilized to evaluate flow at rest and with distal augmentation maneuvers. COMPARISON:  None. FINDINGS: Contralateral Subclavian Vein: Respiratory phasicity is normal  and symmetric with the symptomatic side. No evidence of thrombus. Normal compressibility. Internal Jugular Vein: No evidence of thrombus. Normal compressibility, respiratory phasicity and response to augmentation. Subclavian Vein: No evidence of thrombus. Normal compressibility, respiratory phasicity and response to augmentation. Axillary Vein: No evidence of thrombus. Normal compressibility, respiratory phasicity and response to augmentation. Cephalic Vein: No evidence of thrombus. Normal compressibility, respiratory phasicity and response to augmentation. Basilic Vein: No evidence of thrombus. Normal compressibility, respiratory phasicity and response to augmentation. Brachial Veins: No evidence of thrombus. Normal compressibility, respiratory phasicity and response to augmentation. Radial Veins: No evidence of thrombus. Normal compressibility, respiratory phasicity and response to augmentation. Ulnar Veins: No evidence of thrombus. Normal compressibility, respiratory phasicity and response to augmentation. Venous Reflux:  None visualized. Other Findings: PICC line in place. No thrombus seen along the course of the PICC line. Ill-defined edema within the subcutaneous soft tissues of the right arm. IMPRESSION: No evidence of DVT within the right upper extremity. Ill-defined edema within the subcutaneous soft tissues. Electronically Signed   By: Franki Cabot M.D.   On: 09/13/2016 13:18    Assessment: 83 year old male with history of COPD, undergoing treatment for possible healthcare associated pneumonia, left foot cellulitis, and EGD recently completed due to chronic dysphagia with empiric dilation of esophagus, chronic finding of gastric lipoma, normal duodenum. Known age-related dysmotility on BPE historically. To resume Eliquis today.   Clinically improved, noting improvement after empiric dilation.      Plan: Dysphagia 2 diet Protonix once each morning May resume Eliquis today: defer to admitting  provider  Will follow peripherally  Annitta Needs, PhD, ANP-BC [facility]     LOS: 4 days    09/15/2016, 8:03 AM

## 2016-09-16 ENCOUNTER — Non-Acute Institutional Stay (SKILLED_NURSING_FACILITY): Payer: Medicare Other | Admitting: Internal Medicine

## 2016-09-16 ENCOUNTER — Encounter: Payer: Self-pay | Admitting: Internal Medicine

## 2016-09-16 ENCOUNTER — Other Ambulatory Visit (HOSPITAL_COMMUNITY)
Admission: AD | Admit: 2016-09-16 | Discharge: 2016-09-16 | Disposition: A | Discharge status: Home / Self Care | Payer: Medicare Other | Source: Skilled Nursing Facility | Attending: Internal Medicine | Admitting: Internal Medicine

## 2016-09-16 DIAGNOSIS — K222 Esophageal obstruction: Secondary | ICD-10-CM | POA: Diagnosis not present

## 2016-09-16 DIAGNOSIS — L089 Local infection of the skin and subcutaneous tissue, unspecified: Secondary | ICD-10-CM

## 2016-09-16 DIAGNOSIS — Z794 Long term (current) use of insulin: Secondary | ICD-10-CM | POA: Diagnosis not present

## 2016-09-16 DIAGNOSIS — I2510 Atherosclerotic heart disease of native coronary artery without angina pectoris: Secondary | ICD-10-CM | POA: Diagnosis not present

## 2016-09-16 DIAGNOSIS — I4891 Unspecified atrial fibrillation: Secondary | ICD-10-CM

## 2016-09-16 DIAGNOSIS — J189 Pneumonia, unspecified organism: Secondary | ICD-10-CM

## 2016-09-16 DIAGNOSIS — E11628 Type 2 diabetes mellitus with other skin complications: Secondary | ICD-10-CM | POA: Diagnosis not present

## 2016-09-16 DIAGNOSIS — J181 Lobar pneumonia, unspecified organism: Secondary | ICD-10-CM | POA: Diagnosis not present

## 2016-09-16 DIAGNOSIS — R69 Illness, unspecified: Secondary | ICD-10-CM | POA: Insufficient documentation

## 2016-09-16 LAB — BUN: BUN: 10 mg/dL (ref 6–20)

## 2016-09-16 LAB — CREATININE, SERUM
Creatinine, Ser: 1.24 mg/dL (ref 0.61–1.24)
GFR, EST NON AFRICAN AMERICAN: 52 mL/min — AB (ref >60)

## 2016-09-16 LAB — VANCOMYCIN, TROUGH: VANCOMYCIN TR: 18 ug/mL (ref 15–20)

## 2016-09-16 NOTE — Progress Notes (Signed)
Location:   [facility] Room Number: 129/P Place of Service:  SNF (31) Provider:  Algis Downs, MD  Patient Care Team: [name], MD as PCP - General (Internal Medicine) [name], MD (Internal Medicine) [name] [name], MD as Consulting Physician (Gastroenterology)  Extended Emergency Contact Information Primary Emergency Contact: Thamas Jaegers,  [id number] [address] of [place] Phone: [phone] Mobile Phone: [phone] Relation: Daughter Secondary Emergency Contact: Eugene Gavia [place] of [place] Phone: [phone] Relation: Brother  Code Status:  Full Code Goals of care: Advanced Directive information Advanced Directives 09/16/2016  Does Patient Have a Medical Advance Directive? Yes  Type of Advance Directive (No Data)  Does patient want to make changes to medical advance directive? No - Patient declined  Copy of [place] in Chart? -  Would patient like information on creating a medical advance directive? No - Patient declined     Chief Complaint  Patient presents with  . Hospitalization Follow-up    Hospitalization F/U Visit  For suspected pneumonia-also chest pain  HPI:  Pt is a 83 y.o. male seen today for a hospital f/u s/p admission for initially presented as chest pain-he hospital he was diagnosed with pneumonia-this was evaluated by pulmonology and treated this appears to have improved there was some question whether this was pneumonia versus atelectasis but was treated it appears as pneumonia in the hospital.  And was seen by pulmonology.  He is not complaining of any increased cough or shortness of breath beyond baseline today.  He also has complained of esophageal issues and did receive another dilation by GI in the hospital and apparently this helped with his swallowing issues as well as probably some midline chest discomfort.  He  does have a 6) history of anemia as well this was followed by GI thought to be multi-factorial-hemoglobin showed improvement at 8.2 before discharge we will recheck this.  He continues on Lopressor with a history of atrial fibrillation-he is on Eliquis--for anticoagulation this was held temporarily in the hospital secondary to anemia concerns about was restarted before discharge.  Currently he is sitting in his chair comfortably in his main complaint is he wants to go back to bed he does not complain of any increased shortness of breath or chest pain.    He does have a history of a left foot wound he continues on vancomycin and Zosyn this was  Followed by wound care in the hospital and will have follow-up here.    Past Medical History:  Diagnosis Date  . BPH (benign prostatic hyperplasia)   . CAD (coronary artery disease)    Multivessel status post CABG 2011 - LIMA to LAD, SVG to diagonal, SVG to OM, SVG to PDA  . Cellulitis    12/15  . Chronic back pain   . Chronic diastolic CHF (congestive heart failure) ([place])   . CKD (chronic kidney disease), stage III   . COPD (chronic obstructive pulmonary disease) ([place])   . Essential hypertension   . Gastric mass    EGD 9/15  . GERD (gastroesophageal reflux disease)   . History of DVT (deep vein thrombosis)    Postphlebitic syndrome  . History of kidney stones   . HOH (hard of hearing)   . Hx of CABG   . Hyperlipidemia   . Persistent atrial fibrillation ([place])    a. s/p DCCV 03/2016.  [name] Sleep apnea  Stop Bang score of 5  . Type 2 diabetes mellitus ([place])    Past Surgical History:  Procedure Laterality Date  . CARDIOVERSION N/A 03/20/2016   Procedure: CARDIOVERSION;  Surgeon: Arnoldo Lenis, MD;  [facility];  Service: Endoscopy;  Laterality: N/A;  . CATARACT EXTRACTION W/PHACO Left 02/07/2016   Procedure: CATARACT EXTRACTION PHACO AND INTRAOCULAR LENS PLACEMENT (IOC);  Surgeon: [name], MD;  Location: [facility];   Service: Ophthalmology;  Laterality: Left;  CDE:  23.13  . COLONOSCOPY  2004   Dr. Laural Golden: three small polyps at cecum, path unknown, external hemorrhoids  . COLONOSCOPY N/A 08/16/2013   Dr. [name]: incomplete prep. multiple tubular adenomas, multiple biopsies. Needs surveillance in Aug 2016 due to poor prep  . CORONARY ARTERY BYPASS GRAFT     x5  . CORONARY ARTERY BYPASS GRAFT  2011  . CYSTOSCOPY N/A 12/12/2012   Procedure: CYSTOSCOPY FLEXIBLE;  Surgeon: Marissa Nestle, MD;  Location: [facility];  Service: Urology;  Laterality: N/A;  . ESOPHAGEAL DILATION N/A 09/14/2016   Procedure: ESOPHAGEAL DILATION;  Surgeon: Daneil Dolin, MD;  [facility];  Service: Endoscopy;  Laterality: N/A;  . ESOPHAGOGASTRODUODENOSCOPY N/A 08/16/2013   Dr. [name]: normal esophagus s/p Maloney dilation, gastric erosions, submucosal gastric mass vs extrinsic mass  . ESOPHAGOGASTRODUODENOSCOPY (EGD) WITH PROPOFOL N/A 09/14/2016   Procedure: ESOPHAGOGASTRODUODENOSCOPY (EGD) WITH PROPOFOL;  Surgeon: Daneil Dolin, MD;  [facility];  Service: Endoscopy;  Laterality: N/A;  . EUS N/A 09/07/2013   Dr. 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 INCISION AND DRAINAGE ABSCESS N/A 12/20/2013   Procedure: INCISION AND DRAINAGE ABSCESS NECK;  Surgeon: Jamesetta So, MD;  Location: [facility];  Service: General;  Laterality: N/A;  . INCISION AND DRAINAGE ABSCESS Left 09/04/2016   Procedure: INCISION AND DRAINAGE ABSCESS;  Surgeon: Aviva Signs, MD;  Location: [facility];  Service: General;  Laterality: Left;  [name] MALONEY DILATION N/A 08/16/2013   Procedure: Venia Minks DILATION;  Surgeon: Daneil Dolin, MD;  Location[facility];  Service: Endoscopy;  Laterality: N/A;    No Known Allergies  Outpatient Encounter Prescriptions as of 09/16/2016  Medication Sig  . apixaban (ELIQUIS) 5 MG TABS tablet Take 5 mg by mouth 2 (two) times daily.  . cloNIDine (CATAPRES) 0.1 MG tablet Take 0.1 mg by mouth 2  (two) times daily.  . clotrimazole (LOTRIMIN) 1 % cream Apply topically every morning.  . docusate sodium (COLACE) 100 MG capsule Take 100 mg by mouth 2 (two) times daily.  [name] FLUoxetine (PROZAC) 20 MG/5ML solution Take 5 mLs (20 mg total) by mouth daily.  . insulin glargine (LANTUS) 100 UNIT/ML injection Inject 0.3 mLs (30 Units total) into the skin at bedtime.  [name] ipratropium-albuterol (DUONEB) 0.5-2.5 (3) MG/3ML SOLN Take 3 mLs by nebulization every 6 (six) hours as needed (wheezing).  . LORazepam (ATIVAN) 1 MG tablet Take 1 tablet (1 mg total) by mouth at bedtime.  . metoprolol succinate (TOPROL-XL) 25 MG 24 hr tablet Take 3 tablets (75 mg total) by mouth daily. Take with or immediately following a meal.  . nitroGLYCERIN (NITROSTAT) 0.4 MG SL tablet Place 1 tablet (0.4 mg total) under the tongue every 5 (five) minutes as needed for chest pain.  [name] oxyCODONE (ROXICODONE) 15 MG immediate release tablet Take 15 mg by mouth every 8 (eight) hours as needed for pain.   . pantoprazole (PROTONIX) 40 MG tablet Take 1 tablet (40  mg total) by mouth daily.  . piperacillin-tazobactam (ZOSYN) 3-0.375 GM/50ML IVPB Inject 3.375 g into the vein every 8 (eight) hours. Until 09/22/2016  . rosuvastatin (CRESTOR) 10 MG tablet Take 10 mg by mouth daily.  . Sodium Chloride Flush (NORMAL SALINE FLUSH) 0.9 % SOLN Flush PICC with 5cc normal saline before and after IV ABO administration four times a day  . vancomycin 1,000 mg in sodium chloride 0.9 % 250 mL Inject 1,000 mg into the vein daily. Until 09/22/2016  . [DISCONTINUED] mupirocin cream (BACTROBAN) 2 % Apply topically 2 (two) times daily.   No facility-administered encounter medications on file as of 09/16/2016.      Review of Systems   In general is not complaining of any fever or chills.  Skin does have hematoma to his right hand this currently covered and has been persistent for some time apparently has improved-also has the left foot wound which is currently  covered.  Head ears eyes nose mouth and throat does not complaining of sore throat or visual changes.  Respiratory denies increased shortness of breath or cough be baseline.  Cardiac does not complaining of chest pain time does have some chronic lower extremity edema.  GI does not complain of abdominal discomfort or increased difficulty swallowing at this time does not complain of abdominal pain.  GU does not complain of dysuria.  Musculoskeletal does complain of weakness especially lower extremities is not complaining of acute pain at this time does have significant pain apparently of his left foot and is on oxycodone.  Neurologic does not complain of dizziness headache or numbness currently.  Psych did not complain at this time overt anxiety or depression does have an order for Ativan at night secondary to anxiety at night.    Immunization History  Administered Date(s) Administered  . Influenza,inj,Quad PF,6+ Mos 12/22/2013  . Tdap 08/20/2016   Pertinent  Health Maintenance Due  Topic Date Due  . FOOT EXAM  10/16/2016 (Originally 08/07/1941)  . OPHTHALMOLOGY EXAM  10/16/2016 (Originally 08/07/1941)  . URINE MICROALBUMIN  10/16/2016 (Originally 08/07/1941)  . INFLUENZA VACCINE  12/05/2016 (Originally 08/05/2016)  . PNA vac Low Risk Adult (1 of 2 - PCV13) 12/05/2016 (Originally 08/07/1998)  . HEMOGLOBIN A1C  02/20/2017   No flowsheet data found. Functional Status Survey:    Vitals:   09/16/16 1041  BP: (!) 176/79  Pulse: 76  Resp: 20  Temp: (!) 96.5 F (35.8 C)  TempSrc: Oral    Physical Exam   In general this is a somewhat obese elderly male in no distress sitting comfortably in his chair.  His skin is warm and dry he does have covering over his right hand hematoma as well as left foot wound-she has a well-healed midline chest scar  Eyes pupils appear reactive to light sclera and conjunctiva are clear visual acuity appears grossly intact.  Oropharynx is clear mucous  membranes moist.  Chest is clear to auscultation with somewhat shallow air entry there is no labored breathing.  Heart is regular rate and rhythm with occasional irregular beats without murmur gallop or rub-he has what appears to be 1+ lower extremity edema I suspect this is fairly chronic.  Abdomen soft obese nontender with positive bowel sounds.  Musculoskeletal does have lower extremity weakness with a history of the left foot wound upper extremity strength appears to be relatively preserved.  Neurologic could not really appreciate lateralizing findings her speech is clear cranial nerves appear grossly intact.  Psych he is alert and  oriented and appropriate.    Labs reviewed:  Recent Labs  03/21/16 0556  07/25/16 0528  07/27/16 0815  08/25/16 0553 08/26/16 0526  09/11/16 0611 09/12/16 0629 09/13/16 0647  NA 138  < > 145  < > 140  < > 136 135  < > 135 136 135  K 3.8  < > 4.0  < > 4.2  < > 4.1 4.5  < > 3.7 4.0 3.9  CL 99*  < > 105  < > 101  < > 104 105  < > 98* 96* 97*  CO2 30  < > 31  < > 30  < > 23 22  < > 33* 33* 33*  GLUCOSE 232*  < > 143*  < > 161*  < > 74 160*  < > 170* 173* 127*  BUN 32*  < > 25*  < > 42*  < > 70* 70*  < > 11 12 12   CREATININE 1.79*  < > 1.57*  < > 2.19*  < > 2.42* 2.10*  < > 1.11 1.26* 1.42*  CALCIUM 9.6  < > 9.5  < > 9.1  < > 9.2 9.1  < > 9.7 10.1 9.8  MG 1.9  --  2.0  --  2.1  --   --   --   --   --   --   --   PHOS  --   --   --   --   --   --  5.3* 5.2*  --   --   --   --   < > = values in this interval not displayed.  Recent Labs  08/24/16 0557  08/26/16 0526 09/01/16 1100 09/10/16 0648  AST 21  --   --  19 16  ALT 13*  --   --  35 22  ALKPHOS 94  --   --  201* 142*  BILITOT 0.9  --   --  1.1 0.5  PROT 6.4*  --   --  7.4 5.5*  ALBUMIN 2.8*  < > 2.5* 3.1* 2.2*  < > = values in this interval not displayed.  Recent Labs  09/10/16 1514 09/12/16 0629 09/13/16 0647 09/14/16 0614  WBC 14.6* 12.4* 10.7*  --   NEUTROABS 11.8* 9.0* 7.7   --   HGB 8.7* 8.3* 7.7* 8.2*  HCT 26.8* 26.1* 24.2* 26.1*  MCV 84.5 86.7 87.1  --   PLT 289 314 297  --    Lab Results  Component Value Date   TSH 1.721 09/09/2016   Lab Results  Component Value Date   HGBA1C 9.8 (H) 08/20/2016   Lab Results  Component Value Date   CHOL 154 03/19/2016   HDL 32 (L) 03/19/2016   LDLCALC 95 03/19/2016   TRIG 136 03/19/2016   CHOLHDL 4.8 03/19/2016    Significant Diagnostic Results in last 30 days:  No results found.  Assessment/Plan  #1-pneumonia-this was evaluated by pulmonology in the hospital and has completed treatment this appears to be stable there was some question whether this was pneumonia versus atelectasis--he is on duo nebs when necessary every 6 hours.  #2-history of left foot wound-she continues on vancomycin and Zosyn this is followed by wound care also wound care has ordered a wound care consult with Dr. Dellia Nims.  #3-history of anemia-she was evaluated by GI in the hospital did receive an esophageal dilation also lab work which shows an iron level XXI total iron-binding capacity of  241-ferritin was within normal limits-will continue to guaiac stools here-and also update a CBC tomorrow hemoglobin 8.2 which was up slightly from initial hospital admission at one point hemoglobin was 7.7.  Consider initiation of iron will defer to Dr. Steve Rattler  assessment tomorrow  #4-atrial fibrillation this appears rate controlled on Lopressor he is on Eliquis ran anticoagulation this was temporally held in the hospital by GI secondary to anemia concerns but was restarted-at this point will monitor this appears rate controlled currently.  #5 hypertension continues on clonidine 0.1 mg twice a day as well as Lopressor systolic is somewhat elevated in the 170s today I did check this manually at this point will monitor consider increasing clonidine if this is persistent.  #6-history coronary artery disease he is on nitroglycerin when necessary also  continues on a beta blocker he is also on anticoagulation-at this point appears to be asymptomatic--troponin in hospital was unremarkable.  #7-history of hyperlipidemia he is on a statin LDL was 95 on lab done in March we'll update this as well tomorrow.  #8-history diabetes type 2 he is on Lantus 30 units daily at bedtime sugar this morning was 182 yesterday was largely in the low 200s at this point will monitor--will check a hemoglobin A1c   #9 history of edema-apparently received a course of Lasix in the hospital but this could be secondary to low albumin level which was 2.2 on most recent lab-noted BNP was done on hospital admission which was mildly elevated at 404  At this point will monitor.  #10 history of chronic kidney disease service creatinine was 1.42 BUN 12 which per hospital discharge is relatively's baseline is creatinine has been above 2 at times per chart review.  11 history of GERD and esophageal stricture again he did receive a dilation in the hospital which appears to have health at this point will monitor he is on a proton pump inhibitor Protonix.  #12-pain management he is on oxycodone 15 mg every 8 hours when necessary which appears at this point be effective this point will monitor he does have a significant left foot wound as noted above.  Again will update a CBC to keep an eye on his  hemoglobin as well as a metabolic panel to assess his renal function also will check a hemoglobin A1c with his history of diabetes and a fasting lipid panel to update lipid values.  Also will continue to guaic  stools 3.  [id number] note greater than 45 minutes spent assessing patient-reviewing his chart-reviewing his labs-and coordinating and formulating a plan of care for numerous diagnoses-of note greater than 50% of time spent coordinating plan of care    :

## 2016-09-17 ENCOUNTER — Encounter (HOSPITAL_COMMUNITY)
Admission: RE | Admit: 2016-09-17 | Discharge: 2016-09-17 | Disposition: A | Discharge status: Home / Self Care | Payer: Medicare Other | Source: Skilled Nursing Facility | Attending: Internal Medicine | Admitting: Internal Medicine

## 2016-09-17 ENCOUNTER — Encounter: Payer: Self-pay | Admitting: Internal Medicine

## 2016-09-17 ENCOUNTER — Non-Acute Institutional Stay (SKILLED_NURSING_FACILITY): Payer: Medicare Other | Admitting: Internal Medicine

## 2016-09-17 DIAGNOSIS — I10 Essential (primary) hypertension: Secondary | ICD-10-CM

## 2016-09-17 DIAGNOSIS — N183 Chronic kidney disease, stage 3 unspecified: Secondary | ICD-10-CM

## 2016-09-17 DIAGNOSIS — R1310 Dysphagia, unspecified: Secondary | ICD-10-CM | POA: Diagnosis not present

## 2016-09-17 DIAGNOSIS — I5032 Chronic diastolic (congestive) heart failure: Secondary | ICD-10-CM | POA: Diagnosis not present

## 2016-09-17 DIAGNOSIS — E11621 Type 2 diabetes mellitus with foot ulcer: Secondary | ICD-10-CM | POA: Insufficient documentation

## 2016-09-17 DIAGNOSIS — I4891 Unspecified atrial fibrillation: Secondary | ICD-10-CM | POA: Diagnosis not present

## 2016-09-17 DIAGNOSIS — J189 Pneumonia, unspecified organism: Secondary | ICD-10-CM | POA: Diagnosis not present

## 2016-09-17 DIAGNOSIS — K222 Esophageal obstruction: Secondary | ICD-10-CM

## 2016-09-17 DIAGNOSIS — L089 Local infection of the skin and subcutaneous tissue, unspecified: Secondary | ICD-10-CM | POA: Diagnosis not present

## 2016-09-17 DIAGNOSIS — Z48817 Encounter for surgical aftercare following surgery on the skin and subcutaneous tissue: Secondary | ICD-10-CM | POA: Insufficient documentation

## 2016-09-17 DIAGNOSIS — E11628 Type 2 diabetes mellitus with other skin complications: Secondary | ICD-10-CM | POA: Diagnosis not present

## 2016-09-17 DIAGNOSIS — E1165 Type 2 diabetes mellitus with hyperglycemia: Secondary | ICD-10-CM | POA: Insufficient documentation

## 2016-09-17 LAB — CBC WITH DIFFERENTIAL/PLATELET
BASOS PCT: 0 %
Basophils Absolute: 0.1 10*3/uL (ref 0.0–0.1)
EOS ABS: 0.7 10*3/uL (ref 0.0–0.7)
EOS PCT: 6 %
HCT: 28.7 % — ABNORMAL LOW (ref 39.0–52.0)
HEMOGLOBIN: 9.1 g/dL — AB (ref 13.0–17.0)
LYMPHS ABS: 1.2 10*3/uL (ref 0.7–4.0)
Lymphocytes Relative: 10 %
MCH: 27.2 pg (ref 26.0–34.0)
MCHC: 31.7 g/dL (ref 30.0–36.0)
MCV: 85.7 fL (ref 78.0–100.0)
MONOS PCT: 8 %
Monocytes Absolute: 1.1 10*3/uL — ABNORMAL HIGH (ref 0.1–1.0)
NEUTROS ABS: 9.8 10*3/uL — AB (ref 1.7–7.7)
NEUTROS PCT: 76 %
Platelets: 369 10*3/uL (ref 150–400)
RBC: 3.35 MIL/uL — ABNORMAL LOW (ref 4.22–5.81)
RDW: 14 % (ref 11.5–15.5)
WBC: 12.9 10*3/uL — ABNORMAL HIGH (ref 4.0–10.5)

## 2016-09-17 LAB — LIPID PANEL
CHOLESTEROL: 114 mg/dL (ref 0–200)
HDL: 23 mg/dL — ABNORMAL LOW (ref >40)
LDL Cholesterol: 65 mg/dL (ref 0–99)
TRIGLYCERIDES: 130 mg/dL (ref <150)
Total CHOL/HDL Ratio: 5 RATIO
VLDL: 26 mg/dL (ref 0–40)

## 2016-09-17 LAB — BASIC METABOLIC PANEL
Anion gap: 9 (ref 5–15)
BUN: 9 mg/dL (ref 6–20)
CALCIUM: 10.3 mg/dL (ref 8.9–10.3)
CHLORIDE: 95 mmol/L — AB (ref 101–111)
CO2: 33 mmol/L — ABNORMAL HIGH (ref 22–32)
CREATININE: 1.22 mg/dL (ref 0.61–1.24)
GFR calc non Af Amer: 53 mL/min — ABNORMAL LOW (ref >60)
Glucose, Bld: 93 mg/dL (ref 65–99)
Potassium: 3.2 mmol/L — ABNORMAL LOW (ref 3.5–5.1)
SODIUM: 137 mmol/L (ref 135–145)

## 2016-09-17 LAB — HEMOGLOBIN A1C
Hgb A1c MFr Bld: 9 % — ABNORMAL HIGH (ref 4.8–5.6)
MEAN PLASMA GLUCOSE: 211.6 mg/dL

## 2016-09-17 NOTE — Progress Notes (Signed)
Provider:  Veleta Miners Location:   [facility] Room Number: 129/P Place of Service:  SNF (31)  PCP: [name], MD Patient Care Team: [name], MD as PCP - General (Internal Medicine) [name], MD (Internal Medicine) [name], [name], MD as Consulting Physician (Gastroenterology)  Extended Emergency Contact Information Primary Emergency Contact: Thamas Jaegers, [place] [address] Johnnette Litter of [place] Phone: [phone] Mobile Phone: [phone] Relation: Daughter Secondary Emergency Contact: Eugene Gavia [place] of [place] Phone: [phone] Relation: Brother  Code Status: Full Code Goals of Care: Advanced Directive information Advanced Directives 09/17/2016  Does Patient Have a Medical Advance Directive? Yes  Type of Advance Directive (No Data)  Does patient want to make changes to medical advance directive? No - Patient declined  Copy of [place] in Chart? -  Would patient like information on creating a medical advance directive? No - Patient declined      Chief Complaint  Patient presents with  . New Admit To SNF    New Admission Visit    HPI: Patient is a 83 y.o. male seen today for admission to SNF for therapy.  Patient has h/o COPD on ? Prn oxygen at home, Hypertensin, Diabetes Mellitus Type 2, Chronic Kidney disease, Hyperlipidemia, CAD s/p CABG in 2011, BPH,  Chronic Diastolic failure, with EF of 60%, Chronic Atrial Fibrillation on Chronic Anticoagulation.  Patient has had multiple Hospitalization since 06/18. He was admitted in 08/18 for sepsis after he had Metal piece go through his shoe and entered his Left foot. MRI was negative for any Osteomyelitis. He was send home on clindamycin. He came back to the hospital and was readmitted with Nausea and Dysphagia. He had incision and drainage done bu Dr Arnoldo Morale. This time he was send to the facility on IV  antibiotics. He again have Chest pain with SOB and was send to the Hospital. This time patient has Diagnosis with HCAP His CT Scan of chest showed Bilateral Pleural Effussions with ? Bilateral Infiltrate.Marland KitchenHe also was seen by Gertie Fey and had EGD with Dilatation done. He is now discharged on Vancomycin and Zosyn for 7 days. He is getting Normal saline packing for his wound.  Patient says his Pain is controlled on Oxycodone.  He is still c/o difficulty in swallowing. He is on Dysphagia diet. He continues to be dyspneic. Denies any Cough or chest pain. No fever or chills. Patient lives alone and was driving before admission , his daughter lives near by and help him.   Past Medical History:  Diagnosis Date  . BPH (benign prostatic hyperplasia)   . CAD (coronary artery disease)    Multivessel status post CABG 2011 - LIMA to LAD, SVG to diagonal, SVG to OM, SVG to PDA  . Cellulitis    12/15  . Chronic back pain   . Chronic diastolic CHF (congestive heart failure) ([place])   . CKD (chronic kidney disease), stage III   . COPD (chronic obstructive pulmonary disease) ([place])   . Essential hypertension   . Gastric mass    EGD 9/15  . GERD (gastroesophageal reflux disease)   . History of DVT (deep vein thrombosis)    Postphlebitic syndrome  . History of kidney stones   . HOH (hard of hearing)   . Hx of CABG   . Hyperlipidemia   . Persistent atrial fibrillation ([place])    a. s/p DCCV 03/2016.  [name] Sleep apnea  Stop Bang score of 5  . Type 2 diabetes mellitus ([place])    Past Surgical History:  Procedure Laterality Date  . CARDIOVERSION N/A 03/20/2016   Procedure: CARDIOVERSION;  Surgeon: Arnoldo Lenis, MD;  [facility];  Service: Endoscopy;  Laterality: N/A;  . CATARACT EXTRACTION W/PHACO Left 02/07/2016   Procedure: CATARACT EXTRACTION PHACO AND INTRAOCULAR LENS PLACEMENT (IOC);  Surgeon: [name], MD;  Location: [facility];  Service: Ophthalmology;  Laterality: Left;  CDE:  23.13  .  COLONOSCOPY  2004   Dr. Laural Golden: three small polyps at cecum, path unknown, external hemorrhoids  . COLONOSCOPY N/A 08/16/2013   Dr. [name]: incomplete prep. multiple tubular adenomas, multiple biopsies. Needs surveillance in Aug 2016 due to poor prep  . CORONARY ARTERY BYPASS GRAFT     x5  . CORONARY ARTERY BYPASS GRAFT  2011  . CYSTOSCOPY N/A 12/12/2012   Procedure: CYSTOSCOPY FLEXIBLE;  Surgeon: Marissa Nestle, MD;  Location: [facility];  Service: Urology;  Laterality: N/A;  . ESOPHAGEAL DILATION N/A 09/14/2016   Procedure: ESOPHAGEAL DILATION;  Surgeon: Daneil Dolin, MD;  [facility];  Service: Endoscopy;  Laterality: N/A;  . ESOPHAGOGASTRODUODENOSCOPY N/A 08/16/2013   Dr. [name]: normal esophagus s/p Maloney dilation, gastric erosions, submucosal gastric mass vs extrinsic mass  . ESOPHAGOGASTRODUODENOSCOPY (EGD) WITH PROPOFOL N/A 09/14/2016   Procedure: ESOPHAGOGASTRODUODENOSCOPY (EGD) WITH PROPOFOL;  Surgeon: Daneil Dolin, MD;  [facility];  Service: Endoscopy;  Laterality: N/A;  . EUS N/A 09/07/2013   Dr. 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 INCISION AND DRAINAGE ABSCESS N/A 12/20/2013   Procedure: INCISION AND DRAINAGE ABSCESS NECK;  Surgeon: Jamesetta So, MD;  Location: [facility];  Service: General;  Laterality: N/A;  . INCISION AND DRAINAGE ABSCESS Left 09/04/2016   Procedure: INCISION AND DRAINAGE ABSCESS;  Surgeon: Aviva Signs, MD;  Location: [facility];  Service: General;  Laterality: Left;  [name] MALONEY DILATION N/A 08/16/2013   Procedure: Venia Minks DILATION;  Surgeon: Daneil Dolin, MD;  [facility];  Service: Endoscopy;  Laterality: N/A;    reports that he quit smoking about 60 years ago. He has a 11.00 pack-year smoking history. He has never used smokeless tobacco. He reports that he does not drink alcohol or use drugs. Social History   Social History  . Marital status: Single    Spouse name: N/A  . Number of children: N/A   . Years of education: N/A   Occupational History  . Retired    Social History Main Topics  . Smoking status: Former Smoker    Packs/day: 1.00    Years: 11.00    Quit date: 01/05/1956  . Smokeless tobacco: Never Used  . Alcohol use No  . Drug use: No  . Sexual activity: No   Other Topics Concern  . Not on file   Social History Narrative   ** Merged History Encounter **        Functional Status Survey:    Family History  Problem Relation Age of Onset  . Colon cancer Son 68       deceased    Health Maintenance  Topic Date Due  . FOOT EXAM  10/16/2016 (Originally 08/07/1941)  . OPHTHALMOLOGY EXAM  10/16/2016 (Originally 08/07/1941)  . URINE MICROALBUMIN  10/16/2016 (Originally 08/07/1941)  . INFLUENZA VACCINE  12/05/2016 (Originally 08/05/2016)  . PNA vac Low Risk Adult (1 of 2 - PCV13) 12/05/2016 (Originally 08/07/1998)  .  HEMOGLOBIN A1C  02/20/2017  . TETANUS/TDAP  08/21/2026    No Known Allergies  Outpatient Encounter Prescriptions as of 09/17/2016  Medication Sig  . apixaban (ELIQUIS) 5 MG TABS tablet Take 5 mg by mouth 2 (two) times daily.  . cloNIDine (CATAPRES) 0.1 MG tablet Take 0.1 mg by mouth 2 (two) times daily.  . clotrimazole (LOTRIMIN) 1 % cream Apply topically every morning.  . docusate sodium (COLACE) 100 MG capsule Take 100 mg by mouth 2 (two) times daily.  [name] FLUoxetine (PROZAC) 20 MG/5ML solution Take 5 mLs (20 mg total) by mouth daily.  . insulin glargine (LANTUS) 100 UNIT/ML injection Inject 0.3 mLs (30 Units total) into the skin at bedtime.  [name] ipratropium-albuterol (DUONEB) 0.5-2.5 (3) MG/3ML SOLN Take 3 mLs by nebulization every 6 (six) hours as needed (wheezing).  . LORazepam (ATIVAN) 1 MG tablet Take 1 tablet (1 mg total) by mouth at bedtime.  . metoprolol succinate (TOPROL-XL) 25 MG 24 hr tablet Take 3 tablets (75 mg total) by mouth daily. Take with or immediately following a meal.  . nitroGLYCERIN (NITROSTAT) 0.4 MG SL tablet Place 1 tablet (0.4 mg  total) under the tongue every 5 (five) minutes as needed for chest pain.  [name] oxyCODONE (ROXICODONE) 15 MG immediate release tablet Take 15 mg by mouth every 8 (eight) hours as needed for pain.   . pantoprazole (PROTONIX) 40 MG tablet Take 1 tablet (40 mg total) by mouth daily.  . piperacillin-tazobactam (ZOSYN) 3-0.375 GM/50ML IVPB Inject 3.375 g into the vein every 8 (eight) hours. Until 09/22/2016  . rosuvastatin (CRESTOR) 10 MG tablet Take 10 mg by mouth daily.  . Sodium Chloride Flush (NORMAL SALINE FLUSH) 0.9 % SOLN Flush PICC with 5cc normal saline before and after IV ABO administration four times a day  . vancomycin 1,000 mg in sodium chloride 0.9 % 250 mL Inject 1,000 mg into the vein daily. Until 09/22/2016   No facility-administered encounter medications on file as of 09/17/2016.      Review of Systems  Constitutional: Positive for activity change. Negative for appetite change, fatigue and fever.  HENT: Negative.   Respiratory: Positive for shortness of breath. Negative for cough and chest tightness.   Cardiovascular: Positive for leg swelling. Negative for chest pain.  Gastrointestinal: Negative for abdominal pain, blood in stool, constipation, diarrhea and vomiting.  Genitourinary: Negative.   Musculoskeletal: Negative.   Skin: Positive for wound.  Neurological: Negative.   Psychiatric/Behavioral: Negative.     Vitals:   09/17/16 1511  BP: (!) 160/84  Pulse: 83  Resp: 20  Temp: 98.1 F (36.7 C)  SpO2: 95%   There is no height or weight on file to calculate BMI. Physical Exam  Constitutional: He is oriented to person, place, and time. He appears well-developed and well-nourished.  HENT:  Head: Normocephalic.  Mouth/Throat: Oropharynx is clear and moist.  Eyes: Pupils are equal, round, and reactive to light.  Neck: Neck supple.  Cardiovascular: Normal rate and normal heart sounds.   No murmur heard. Pulmonary/Chest: Effort normal.  Bilateral crackles .  Abdominal:  Soft. Bowel sounds are normal. He exhibits no distension. There is no tenderness. There is no rebound.  Musculoskeletal:  Moderate Edema Bilateral.  Neurological: He is alert and oriented to person, place, and time.  No focal deficit  Skin:  Wound seen with Nurse. He has puncture wound in his dorsal part of his foot with some redness around . Also has redness in between toes  with  necrosis of skin. Good pulses. No odor.  Psychiatric: He has a normal mood and affect. His behavior is normal. Thought content normal.    Labs reviewed: Basic Metabolic Panel:  Recent Labs  03/21/16 0556  07/25/16 0528  07/27/16 0815  08/25/16 0553 08/26/16 0526  09/12/16 0629 09/13/16 0647 09/16/16 1800 09/17/16 0717  NA 138  < > 145  < > 140  < > 136 135  < > 136 135  --  137  K 3.8  < > 4.0  < > 4.2  < > 4.1 4.5  < > 4.0 3.9  --  3.2*  CL 99*  < > 105  < > 101  < > 104 105  < > 96* 97*  --  95*  CO2 30  < > 31  < > 30  < > 23 22  < > 33* 33*  --  33*  GLUCOSE 232*  < > 143*  < > 161*  < > 74 160*  < > 173* 127*  --  93  BUN 32*  < > 25*  < > 42*  < > 70* 70*  < > 12 12 10 9   CREATININE 1.79*  < > 1.57*  < > 2.19*  < > 2.42* 2.10*  < > 1.26* 1.42* 1.24 1.22  CALCIUM 9.6  < > 9.5  < > 9.1  < > 9.2 9.1  < > 10.1 9.8  --  10.3  MG 1.9  --  2.0  --  2.1  --   --   --   --   --   --   --   --   PHOS  --   --   --   --   --   --  5.3* 5.2*  --   --   --   --   --   < > = values in this interval not displayed. Liver Function Tests:  Recent Labs  08/24/16 0557  08/26/16 0526 09/01/16 1100 09/10/16 0648  AST 21  --   --  19 16  ALT 13*  --   --  35 22  ALKPHOS 94  --   --  201* 142*  BILITOT 0.9  --   --  1.1 0.5  PROT 6.4*  --   --  7.4 5.5*  ALBUMIN 2.8*  < > 2.5* 3.1* 2.2*  < > = values in this interval not displayed.  Recent Labs  09/01/16 1100  LIPASE 26   No results for input(s): AMMONIA in the last 8760 hours. CBC:  Recent Labs  09/12/16 0629 09/13/16 0647 09/14/16 0614  09/17/16 0717  WBC 12.4* 10.7*  --  12.9*  NEUTROABS 9.0* 7.7  --  9.8*  HGB 8.3* 7.7* 8.2* 9.1*  HCT 26.1* 24.2* 26.1* 28.7*  MCV 86.7 87.1  --  85.7  PLT 314 297  --  369   Cardiac Enzymes:  Recent Labs  09/10/16 0155 09/10/16 0648 09/10/16 1319  TROPONINI <0.03 <0.03 <0.03   BNP: Invalid input(s): POCBNP Lab Results  Component Value Date   HGBA1C 9.0 (H) 09/17/2016   Lab Results  Component Value Date   TSH 1.721 09/09/2016   Lab Results  Component Value Date   VITAMINB12 829 09/14/2016   Lab Results  Component Value Date   FOLATE 16.0 09/14/2016   Lab Results  Component Value Date   IRON 21 (L) 09/14/2016   TIBC 241 (L) 09/14/2016  FERRITIN 190 09/14/2016    Imaging and Procedures obtained prior to SNF admission: No results found.  Assessment/Plan  Diabetic foot infection  Patient to be continued on Zosyn and Vancomycin for 7 more days He has appointment with Wound care in few weeks. To continue Packing till then.   HCAP (healthcare-associated pneumonia) On Antibiotics for another week. He is also on Oxygen   Chronic diastolic CHF  Patient had BNP of 400 in hospital His Lasix was stopped before due to dehydration and increased BUN Will restart him on lasix 40 mg Follow renal function Daily weights. POX QD  Atrial fibrillation with normal ventricular rate  Rate controlled on Metoprolol  Essential hypertension BP has been elevated in the Facility. It seems he was on Norvasc and Angiotensin which was stopped when patient was in sepsis.. Will start him on Lasix if no improvement consider increasing dose of clonidine.   Dysphagia He was recently stretched by GI. Recommended Dysphagia diet and Protonix.  CKD (chronic kidney disease), stage III Creat Improved will follow once Lasix started.  Diabetes type 2 BS are controlled  in facility He is on Lantus 30 units. His A1C was elevated to 9 Will continue to monitor Accu  check.  Anemia Will start him on iron but it is more likely combination of chronic disease and iron deficiency. Pain control  Patient has been on high doses of Oxycodone as outpatient Will discontinue Ativan which seems new medicine. He seems very sleepy. Family/ staff Communication:   Labs/tests ordered:  Total time spent in this patient care encounter was 45_ minutes; greater than 50% of the visit spent counseling patient, reviewing records , Labs and coordinating care for problems addressed at this encounter.

## 2016-09-20 ENCOUNTER — Encounter (HOSPITAL_COMMUNITY)
Admission: AD | Admit: 2016-09-20 | Discharge: 2016-09-20 | Disposition: A | Discharge status: Home / Self Care | Payer: Medicare Other | Source: Skilled Nursing Facility

## 2016-09-20 LAB — VANCOMYCIN, TROUGH: Vancomycin Tr: 16 ug/mL (ref 15–20)

## 2016-09-20 LAB — CREATININE, SERUM
Creatinine, Ser: 1.47 mg/dL — ABNORMAL HIGH (ref 0.61–1.24)
GFR calc Af Amer: 49 mL/min — ABNORMAL LOW (ref >60)
GFR calc non Af Amer: 42 mL/min — ABNORMAL LOW (ref >60)

## 2016-09-20 LAB — BUN: BUN: 16 mg/dL (ref 6–20)

## 2016-09-21 ENCOUNTER — Encounter (HOSPITAL_COMMUNITY)
Admission: RE | Admit: 2016-09-21 | Discharge: 2016-09-21 | Disposition: A | Discharge status: Home / Self Care | Payer: Medicare Other | Source: Skilled Nursing Facility | Attending: *Deleted | Admitting: *Deleted

## 2016-09-22 ENCOUNTER — Non-Acute Institutional Stay (SKILLED_NURSING_FACILITY): Payer: Medicare Other | Admitting: Internal Medicine

## 2016-09-22 ENCOUNTER — Encounter: Payer: Self-pay | Admitting: Internal Medicine

## 2016-09-22 ENCOUNTER — Other Ambulatory Visit (HOSPITAL_COMMUNITY)
Admission: RE | Admit: 2016-09-22 | Discharge: 2016-09-22 | Disposition: A | Discharge status: Home / Self Care | Payer: Medicare Other | Source: Skilled Nursing Facility | Attending: Internal Medicine | Admitting: Internal Medicine

## 2016-09-22 DIAGNOSIS — E11628 Type 2 diabetes mellitus with other skin complications: Secondary | ICD-10-CM

## 2016-09-22 DIAGNOSIS — Z794 Long term (current) use of insulin: Secondary | ICD-10-CM

## 2016-09-22 DIAGNOSIS — E11621 Type 2 diabetes mellitus with foot ulcer: Secondary | ICD-10-CM | POA: Insufficient documentation

## 2016-09-22 DIAGNOSIS — I10 Essential (primary) hypertension: Secondary | ICD-10-CM | POA: Diagnosis not present

## 2016-09-22 DIAGNOSIS — J189 Pneumonia, unspecified organism: Secondary | ICD-10-CM | POA: Insufficient documentation

## 2016-09-22 DIAGNOSIS — E1165 Type 2 diabetes mellitus with hyperglycemia: Secondary | ICD-10-CM | POA: Insufficient documentation

## 2016-09-22 DIAGNOSIS — I5032 Chronic diastolic (congestive) heart failure: Secondary | ICD-10-CM | POA: Diagnosis not present

## 2016-09-22 DIAGNOSIS — I4891 Unspecified atrial fibrillation: Secondary | ICD-10-CM | POA: Diagnosis not present

## 2016-09-22 DIAGNOSIS — Z48817 Encounter for surgical aftercare following surgery on the skin and subcutaneous tissue: Secondary | ICD-10-CM | POA: Insufficient documentation

## 2016-09-22 LAB — CBC WITH DIFFERENTIAL/PLATELET
Basophils Absolute: 0.1 10*3/uL (ref 0.0–0.1)
Basophils Relative: 1 %
EOS PCT: 8 %
Eosinophils Absolute: 1 10*3/uL — ABNORMAL HIGH (ref 0.0–0.7)
HEMATOCRIT: 27.7 % — AB (ref 39.0–52.0)
Hemoglobin: 8.7 g/dL — ABNORMAL LOW (ref 13.0–17.0)
LYMPHS PCT: 7 %
Lymphs Abs: 0.9 10*3/uL (ref 0.7–4.0)
MCH: 26.9 pg (ref 26.0–34.0)
MCHC: 31.4 g/dL (ref 30.0–36.0)
MCV: 85.8 fL (ref 78.0–100.0)
MONO ABS: 0.9 10*3/uL (ref 0.1–1.0)
MONOS PCT: 7 %
NEUTROS ABS: 9.8 10*3/uL — AB (ref 1.7–7.7)
Neutrophils Relative %: 77 %
PLATELETS: 323 10*3/uL (ref 150–400)
RBC: 3.23 MIL/uL — ABNORMAL LOW (ref 4.22–5.81)
RDW: 14.3 % (ref 11.5–15.5)
WBC: 12.6 10*3/uL — ABNORMAL HIGH (ref 4.0–10.5)

## 2016-09-22 LAB — BASIC METABOLIC PANEL
Anion gap: 12 (ref 5–15)
BUN: 16 mg/dL (ref 6–20)
CALCIUM: 10.5 mg/dL — AB (ref 8.9–10.3)
CO2: 30 mmol/L (ref 22–32)
CREATININE: 1.41 mg/dL — AB (ref 0.61–1.24)
Chloride: 94 mmol/L — ABNORMAL LOW (ref 101–111)
GFR calc Af Amer: 52 mL/min — ABNORMAL LOW (ref >60)
GFR, EST NON AFRICAN AMERICAN: 44 mL/min — AB (ref >60)
GLUCOSE: 162 mg/dL — AB (ref 65–99)
Potassium: 3.2 mmol/L — ABNORMAL LOW (ref 3.5–5.1)
Sodium: 136 mmol/L (ref 135–145)

## 2016-09-22 NOTE — Progress Notes (Addendum)
Location:   [facility] Room Number: 129/P Place of Service:  SNF (31) Provider:  Jeannine Kitten, MD  Patient Care Team: [name], MD as PCP - General (Internal Medicine) [name], MD (Internal Medicine) [name] [name], MD as Consulting Physician (Gastroenterology)  Extended Emergency Contact Information Primary Emergency Contact: Thamas Jaegers, [place] [address] [address]nette [place]itter of [place] Phone: [phone] Mobile Phone: [phone] Relation: Daughter Secondary Emergency Contact: Eugene Gavia [place] of [place] Phone: [phone] Relation: Brother  Code Status:  Full Code Goals of care: Advanced Directive information Advanced Directives 09/22/2016  Does Patient Have a Medical Advance Directive? Yes  Type of Advance Directive (No Data)  Does patient want to make changes to medical advance directive? No - Patient declined  Copy of [place] in Chart? -  Would patient like information on creating a medical advance directive? No - Patient declined     Chief Complaint  Patient presents with  . Acute Visit    Elevated B/P    HPI:  Pt is a 83 y.o. male seen today for an acute visit for Elevated BP in therapy.  Patient has h/o COPD on ? Prn oxygen at home, Hypertensin, Diabetes Mellitus Type 2, Chronic Kidney disease, Hyperlipidemia, CAD s/p CABG in 2011, BPH,  Chronic Diastolic failure, with EF of 60%, Chronic Atrial Fibrillation on Chronic Anticoagulation. Patient was admitted to the SNF for therapy, Wound care and IV antibiotics.  Patient was in therapy today and his BP was 180/90 and he was c/o feeling dizzy and Nausea. Right now patient is sitting in his wheelchair. He says he is always SOB.[name] He denies any Cough or Chest pain. His legs are still Swollen. We had started his home dose lasix 40 mg on last visit . It seems he has lost around 4 lbs since then and is  now 214 lbs. He is getting Wound dressing everyday and he says pain is controlled.   Past Medical History:  Diagnosis Date  . BPH (benign prostatic hyperplasia)   . CAD (coronary artery disease)    Multivessel status post CABG 2011 - LIMA to LAD, SVG to diagonal, SVG to OM, SVG to PDA  . Cellulitis    12/15  . Chronic back pain   . Chronic diastolic CHF (congestive heart failure) ([place])   . CKD (chronic kidney disease), stage III   . COPD (chronic obstructive pulmonary disease) ([place])   . Essential hypertension   . Gastric mass    EGD 9/15  . GERD (gastroesophageal reflux disease)   . History of DVT (deep vein thrombosis)    Postphlebitic syndrome  . History of kidney stones   . HOH (hard of hearing)   . Hx of CABG   . Hyperlipidemia   . Persistent atrial fibrillation ([place])    a. s/p DCCV 03/2016.  [name] Sleep apnea    Stop Bang score of 5  . Type 2 diabetes mellitus ([place])    Past Surgical History:  Procedure Laterality Date  . CARDIOVERSION N/A 03/20/2016   Procedure: CARDIOVERSION;  Surgeon: Arnoldo Lenis, MD;  [facility];  Service: Endoscopy;  Laterality: N/A;  . CATARACT EXTRACTION W/PHACO Left 02/07/2016   Procedure: CATARACT EXTRACTION PHACO AND INTRAOCULAR LENS PLACEMENT (IOC);  Surgeon: [name], MD;  Location: [facility];  Service: Ophthalmology;  Laterality: Left;  CDE:  23.13  . COLONOSCOPY  2004  Dr. Laural Golden: three small polyps at cecum, path unknown, external hemorrhoids  . COLONOSCOPY N/A 08/16/2013   Dr. [name]: incomplete prep. multiple tubular adenomas, multiple biopsies. Needs surveillance in Aug 2016 due to poor prep  . CORONARY ARTERY BYPASS GRAFT     x5  . CORONARY ARTERY BYPASS GRAFT  2011  . CYSTOSCOPY N/A 12/12/2012   Procedure: CYSTOSCOPY FLEXIBLE;  Surgeon: Marissa Nestle, MD;  Location: [facility];  Service: Urology;  Laterality: N/A;  . ESOPHAGEAL DILATION N/A 09/14/2016   Procedure: ESOPHAGEAL DILATION;  Surgeon: Daneil Dolin, MD;   [facility];  Service: Endoscopy;  Laterality: N/A;  . ESOPHAGOGASTRODUODENOSCOPY N/A 08/16/2013   Dr. [name]: normal esophagus s/p Maloney dilation, gastric erosions, submucosal gastric mass vs extrinsic mass  . ESOPHAGOGASTRODUODENOSCOPY (EGD) WITH PROPOFOL N/A 09/14/2016   Procedure: ESOPHAGOGASTRODUODENOSCOPY (EGD) WITH PROPOFOL;  Surgeon: Daneil Dolin, MD;  [facility];  Service: Endoscopy;  Laterality: N/A;  . EUS N/A 09/07/2013   Dr. 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 INCISION AND DRAINAGE ABSCESS N/A 12/20/2013   Procedure: INCISION AND DRAINAGE ABSCESS NECK;  Surgeon: Jamesetta So, MD;  Location: [facility];  Service: General;  Laterality: N/A;  . INCISION AND DRAINAGE ABSCESS Left 09/04/2016   Procedure: INCISION AND DRAINAGE ABSCESS;  Surgeon: Aviva Signs, MD;  Location: [facility];  Service: General;  Laterality: Left;  [name] MALONEY DILATION N/A 08/16/2013   Procedure: Venia Minks DILATION;  Surgeon: Daneil Dolin, MD;  [facility];  Service: Endoscopy;  Laterality: N/A;    No Known Allergies  Outpatient Encounter Prescriptions as of 09/22/2016  Medication Sig  . Amino Acids-Protein Hydrolys (FEEDING SUPPLEMENT, PRO-STAT SUGAR FREE 64,) LIQD Take 30 mLs by mouth 2 (two) times daily between meals.  [name] antiseptic oral rinse (BIOTENE) LIQD 15 mLs by Mouth Rinse route 4 (four) times daily.  [name] apixaban (ELIQUIS) 5 MG TABS tablet Take 5 mg by mouth 2 (two) times daily.  . cloNIDine (CATAPRES) 0.1 MG tablet Take 0.1 mg by mouth 2 (two) times daily.  . clotrimazole (LOTRIMIN) 1 % cream Apply topically every morning.  . docusate sodium (COLACE) 100 MG capsule Take 100 mg by mouth 2 (two) times daily.  . Ferrous Sulfate (IRON) 325 (65 Fe) MG TABS Give 1 tablet by mouth once a day  . FLUoxetine (PROZAC) 20 MG/5ML solution Take 5 mLs (20 mg total) by mouth daily.  . furosemide (LASIX) 40 MG tablet Take 40 mg by mouth daily.  . insulin  glargine (LANTUS) 100 UNIT/ML injection Inject 0.3 mLs (30 Units total) into the skin at bedtime.  [name] ipratropium-albuterol (DUONEB) 0.5-2.5 (3) MG/3ML SOLN Take 3 mLs by nebulization every 6 (six) hours as needed (wheezing).  . metoprolol succinate (TOPROL-XL) 25 MG 24 hr tablet Take 3 tablets (75 mg total) by mouth daily. Take with or immediately following a meal.  . nitroGLYCERIN (NITROSTAT) 0.4 MG SL tablet Place 1 tablet (0.4 mg total) under the tongue every 5 (five) minutes as needed for chest pain.  [name] oxyCODONE (ROXICODONE) 15 MG immediate release tablet Take 15 mg by mouth every 8 (eight) hours as needed for pain.   . pantoprazole (PROTONIX) 40 MG tablet Take 1 tablet (40 mg total) by mouth daily.  . piperacillin-tazobactam (ZOSYN) 3-0.375 GM/50ML IVPB Inject 3.375 g into the vein every 8 (eight) hours. Until 09/22/2016  . potassium chloride SA (K-DUR,KLOR-CON) 20 MEQ tablet Take 60 mEq by mouth  daily.  . rosuvastatin (CRESTOR) 10 MG tablet Take 10 mg by mouth daily.  . Sodium Chloride Flush (NORMAL SALINE FLUSH) 0.9 % SOLN Flush PICC with 5cc normal saline before and after IV ABO administration four times a day  . vancomycin 1,000 mg in sodium chloride 0.9 % 250 mL Inject 1,000 mg into the vein daily. Until 09/22/2016  . [DISCONTINUED] LORazepam (ATIVAN) 1 MG tablet Take 1 tablet (1 mg total) by mouth at bedtime.   No facility-administered encounter medications on file as of 09/22/2016.      Review of Systems  Constitutional: Positive for activity change and appetite change. Negative for chills and fever.  HENT: Negative.   Respiratory: Positive for cough and shortness of breath. Negative for apnea.   Cardiovascular: Positive for leg swelling. Negative for chest pain.  Gastrointestinal: Positive for nausea. Negative for abdominal distention and abdominal pain.  Genitourinary: Negative.   Musculoskeletal: Negative.   Skin: Positive for wound.  Neurological: Negative.     Psychiatric/Behavioral: Negative.     Immunization History  Administered Date(s) Administered  . Influenza,inj,Quad PF,6+ Mos 12/22/2013  . Tdap 08/20/2016   Pertinent  Health Maintenance Due  Topic Date Due  . FOOT EXAM  10/16/2016 (Originally 08/07/1941)  . OPHTHALMOLOGY EXAM  10/16/2016 (Originally 08/07/1941)  . URINE MICROALBUMIN  10/16/2016 (Originally 08/07/1941)  . INFLUENZA VACCINE  12/05/2016 (Originally 08/05/2016)  . PNA vac Low Risk Adult (1 of 2 - PCV13) 12/05/2016 (Originally 08/07/1998)  . HEMOGLOBIN A1C  03/17/2017   No flowsheet data found. Functional Status Survey:    Vitals:   09/22/16 1415  BP: (!) 184/86  Pulse: 81  Resp: 20  Temp: (!) 96.6 F (35.9 C)  TempSrc: Oral   There is no height or weight on file to calculate BMI. Physical Exam  Constitutional: He appears well-nourished.  HENT:  Head: Normocephalic.  Mouth/Throat: Oropharynx is clear and moist.  Eyes: Pupils are equal, round, and reactive to light.  Neck: Neck supple.  Cardiovascular: Normal rate and normal heart sounds.  An irregular rhythm present.  No murmur heard. Pulmonary/Chest: Breath sounds normal. No respiratory distress. He has no wheezes. He has no rales.  Decreased breadth Sounds Bilateral  Abdominal: Soft. Bowel sounds are normal. He exhibits no distension. There is no tenderness. There is no rebound.  Musculoskeletal:  Moderate Edema Bilateral    Labs reviewed:  Recent Labs  03/21/16 0556  07/25/16 0528  07/27/16 0815  08/25/16 0553 08/26/16 0526  09/13/16 0647  09/17/16 0717 09/20/16 1730 09/22/16 0700  NA 138  < > 145  < > 140  < > 136 135  < > 135  --  137  --  136  K 3.8  < > 4.0  < > 4.2  < > 4.1 4.5  < > 3.9  --  3.2*  --  3.2*  CL 99*  < > 105  < > 101  < > 104 105  < > 97*  --  95*  --  94*  CO2 30  < > 31  < > 30  < > 23 22  < > 33*  --  33*  --  30  GLUCOSE 232*  < > 143*  < > 161*  < > 74 160*  < > 127*  --  93  --  162*  BUN 32*  < > 25*  < > 42*  < >  70* 70*  < > 12  < > 9  16 16  CREATININE 1.79*  < > 1.57*  < > 2.19*  < > 2.42* 2.10*  < > 1.42*  < > 1.22 1.47* 1.41*  CALCIUM 9.6  < > 9.5  < > 9.1  < > 9.2 9.1  < > 9.8  --  10.3  --  10.5*  MG 1.9  --  2.0  --  2.1  --   --   --   --   --   --   --   --   --   PHOS  --   --   --   --   --   --  5.3* 5.2*  --   --   --   --   --   --   < > = values in this interval not displayed.  Recent Labs  08/24/16 0557  08/26/16 0526 09/01/16 1100 09/10/16 0648  AST 21  --   --  19 16  ALT 13*  --   --  35 22  ALKPHOS 94  --   --  201* 142*  BILITOT 0.9  --   --  1.1 0.5  PROT 6.4*  --   --  7.4 5.5*  ALBUMIN 2.8*  < > 2.5* 3.1* 2.2*  < > = values in this interval not displayed.  Recent Labs  09/13/16 0647 09/14/16 0614 09/17/16 0717 09/22/16 0700  WBC 10.7*  --  12.9* 12.6*  NEUTROABS 7.7  --  9.8* 9.8*  HGB 7.7* 8.2* 9.1* 8.7*  HCT 24.2* 26.1* 28.7* 27.7*  MCV 87.1  --  85.7 85.8  PLT 297  --  369 323   Lab Results  Component Value Date   TSH 1.721 09/09/2016   Lab Results  Component Value Date   HGBA1C 9.0 (H) 09/17/2016   Lab Results  Component Value Date   CHOL 114 09/17/2016   HDL 23 (L) 09/17/2016   LDLCALC 65 09/17/2016   TRIG 130 09/17/2016   CHOLHDL 5.0 09/17/2016    Significant Diagnostic Results in last 30 days:  Dg Chest 2 View  Result Date: 09/09/2016 CLINICAL DATA:  Central chest pain on off for 4 weeks. History of CHF, diabetes, hypertension, coronary artery disease, gastroesophageal reflux disease, DVT, COPD, atrial fibrillation, chronic kidney disease, coronary artery bypass. EXAM: CHEST  2 VIEW COMPARISON:  09/01/2016 FINDINGS: Postoperative changes in the mediastinum. Cardiac enlargement without significant vascular congestion. Increasing interstitial pattern to the lung bases particularly on the left may indicate developing interstitial edema or pneumonia. Small left pleural effusion has developed since previous study. Atelectasis or infiltration in  the left lung base is also developing. Calcification of the aorta. No pneumothorax. Right PICC line with tip over the low SVC region. Degenerative changes in the spine and shoulders. IMPRESSION: Cardiac enlargement. Developing consolidation or atelectasis in the left lung base with new small left pleural effusion and increasing bibasilar interstitial changes. Electronically Signed   By: Lucienne Capers M.D.   On: 09/09/2016 21:08   Dg Chest 2 View  Result Date: 09/01/2016 CLINICAL DATA:  Generalized abdominal and chest pain. EXAM: CHEST  2 VIEW COMPARISON:  08/27/2016. FINDINGS: Stable cardiomegaly with aortic atherosclerosis. Status post CABG. Mild interstitial edema with atelectasis at the lung bases. No pneumonic consolidation, effusion or pneumothorax. No acute osseous abnormality. IMPRESSION: 1. Stable cardiomegaly with aortic atherosclerosis and post CABG change. 2. Bibasilar atelectasis with minimal interstitial edema. Electronically Signed   By: Meredith Leeds.D.  On: 09/01/2016 14:28   Dg Chest 2 View  Result Date: 08/27/2016 CLINICAL DATA:  Shortness of breath, hypertension, history of coronary artery disease and previous COPD. Remote history of smoking. EXAM: CHEST  2 VIEW COMPARISON:  PA and lateral chest x-ray of August 20, 2016 FINDINGS: Today's study is obtained in a lordotic fashion. The lungs are reasonably well inflated. The interstitial markings remain increased. The cardiac silhouette remains enlarged. The pulmonary vascularity is slightly less engorged. There is calcification in the wall of the aortic arch. The sternal wires are intact. A small amount of pleural fluid is likely present on the right. IMPRESSION: CHF with mild interstitial edema stable to slightly improved since the previous study. Previous CABG.  Thoracic aortic atherosclerosis. Electronically Signed   By: David  Martinique M.D.   On: 08/27/2016 11:31   Ct Chest W Contrast  Result Date: 09/11/2016 CLINICAL DATA:   83 year old male with intermittent chest pain. Unresolved pneumonia. EXAM: CT CHEST WITH CONTRAST TECHNIQUE: Multidetector CT imaging of the chest was performed during intravenous contrast administration. CONTRAST:  54mL ISOVUE-300 IOPAMIDOL (ISOVUE-300) INJECTION 61% COMPARISON:  Chest radiographs 09/09/2016 and earlier. Chest CTs 06/10/2016 and 01/11/2015 FINDINGS: Cardiovascular: Prior CABG. Stable cardiomegaly. No pericardial effusion. Calcified aortic atherosclerosis. Mediastinum/Nodes: Stable small mediastinal lymph nodes. No lymphadenopathy. Stable and negative for age thyroid. Lungs/Pleura: Major airways are patent, with no residual retained secretions which were visible in the trachea in June. Layering small to moderate bilateral pleural effusions are progressed on the left and new on the right since June. Associated left greater than right confluent lower lobe opacity has also progressed, and there are associated air bronchograms, but the affected lung appears to be enhancing, favoring atelectasis over consolidation. Mild additional superimposed bilateral dependent and compressive atelectasis. No superimposed pulmonary nodularity. Upper Abdomen: Stable visualized upper abdominal viscera with no significant abnormality. Musculoskeletal: Osteopenia. Prior sternotomy. No thoracic vertebral fracture or acute osseous finding identified. IMPRESSION: 1. New/increased bilateral layering pleural effusions since June, small to moderate and greater on the left. 2. Associated increased and confluent bilateral lower lobe opacity, greater on the left, with air bronchograms. Burtis Junes this is atelectasis, although bilateral lower lobe pneumonia is difficult to exclude. 3. Otherwise stable chest including cardiomegaly, Aortic Atherosclerosis (ICD10-I70.0), prior CABG. Electronically Signed   By: Genevie Ann M.D.   On: 09/11/2016 10:10   US Renal  Result Date: 08/24/2016 CLINICAL DATA:  Acute on chronic renal failure. History  of BPH, hypertension, diabetes, coronary artery disease. EXAM: RENAL / URINARY TRACT ULTRASOUND COMPLETE COMPARISON:  CT scan of the chest and abdomen of June 10, 2016 FINDINGS: Right Kidney: Length: 13.8 cm. The renal cortical echotexture is approximately equal to that of the liver. There is no hydronephrosis nor cystic or solid mass. Left Kidney: Length: 12.8 cm. The renal cortical echotexture is similar to that on the right. There is no cystic or solid mass nor hydronephrosis. I cannot exclude a small amount of perinephric fluid on the left. Bladder: Appears normal for degree of bladder distention. IMPRESSION: Increased renal cortical echotexture consistent with medical renal disease. There is no hydronephrosis. There may be a small amount of perinephric fluid on the left. Normal appearance of the partially distended urinary bladder. Electronically Signed   By: David  Martinique M.D.   On: 08/24/2016 09:48   US Venous Img Upper Uni Right  Result Date: 09/13/2016 CLINICAL DATA:  Right upper extremity pain with hand edema for 2 weeks. Right-sided PICC line in place. EXAM: RIGHT UPPER EXTREMITY  VENOUS DOPPLER ULTRASOUND TECHNIQUE: Gray-scale sonography with graded compression, as well as color Doppler and duplex ultrasound were performed to evaluate the upper extremity deep venous system from the level of the subclavian vein and including the jugular, axillary, basilic, radial, ulnar and upper cephalic vein. Spectral Doppler was utilized to evaluate flow at rest and with distal augmentation maneuvers. COMPARISON:  None. FINDINGS: Contralateral Subclavian Vein: Respiratory phasicity is normal and symmetric with the symptomatic side. No evidence of thrombus. Normal compressibility. Internal Jugular Vein: No evidence of thrombus. Normal compressibility, respiratory phasicity and response to augmentation. Subclavian Vein: No evidence of thrombus. Normal compressibility, respiratory phasicity and response to augmentation.  Axillary Vein: No evidence of thrombus. Normal compressibility, respiratory phasicity and response to augmentation. Cephalic Vein: No evidence of thrombus. Normal compressibility, respiratory phasicity and response to augmentation. Basilic Vein: No evidence of thrombus. Normal compressibility, respiratory phasicity and response to augmentation. Brachial Veins: No evidence of thrombus. Normal compressibility, respiratory phasicity and response to augmentation. Radial Veins: No evidence of thrombus. Normal compressibility, respiratory phasicity and response to augmentation. Ulnar Veins: No evidence of thrombus. Normal compressibility, respiratory phasicity and response to augmentation. Venous Reflux:  None visualized. Other Findings: PICC line in place. No thrombus seen along the course of the PICC line. Ill-defined edema within the subcutaneous soft tissues of the right arm. IMPRESSION: No evidence of DVT within the right upper extremity. Ill-defined edema within the subcutaneous soft tissues. Electronically Signed   By: Franki Cabot M.D.   On: 09/13/2016 13:18   US Arterial Seg Single  Result Date: 09/02/2016 CLINICAL DATA:  83 year old male with left foot puncture wound and cellulitis. EXAM: NONINVASIVE PHYSIOLOGIC VASCULAR STUDY OF BILATERAL LOWER EXTREMITIES TECHNIQUE: Evaluation of both lower extremities was performed at rest, including calculation of ankle-brachial indices, multiple segmental pressure evaluation, segmental Doppler and segmental pulse volume recording. COMPARISON:  None. FINDINGS: Right ABI:  1.0 Left ABI:  1.2 Right Lower Extremity: Normal posterior tibial arterial waveform. The dorsalis pedis waveform is slightly abnormal suggesting an element of anterior tibial or small vessel disease. Left Lower Extremity: Normal posterior tibial waveform. The dorsalis pedis waveform is slightly abnormal suggesting anterior tibial or small vessel disease. IMPRESSION: Normal bilateral resting  ankle-brachial indices. Signed, Criselda Peaches, MD [facility] Electronically Signed   By: Jacqulynn Cadet M.D.   On: 09/02/2016 11:10   Dg Chest Port 1 View  Result Date: 09/12/2016 CLINICAL DATA:  PICC line placement. EXAM: PORTABLE CHEST 1 VIEW COMPARISON:  Chest x-ray dated 09/09/2016. FINDINGS: Right-sided PICC line in place. Tip appears stable in position at the level of the mid SVC. Heart size and mediastinal contours are stable. Opacity at the left lung base appears stable, likely atelectasis and/or small effusion. No new lung findings. IMPRESSION: PICC line appears adequately positioned at the level of the mid SVC. Electronically Signed   By: Franki Cabot M.D.   On: 09/12/2016 13:04   Dg Foot Complete Left  Result Date: 09/01/2016 CLINICAL DATA:  Left foot redness. EXAM: LEFT FOOT - COMPLETE 3+ VIEW COMPARISON:  08/20/2016 FINDINGS: There is no evidence of fracture or dislocation. There is no periosteal reaction or bone destruction. There soft tissue emphysema between the third and fourth metatarsals concerning for infection. IMPRESSION: 1. No evidence of osteomyelitis of the left foot. 2. Soft tissue emphysema between the third and fourth metatarsals concerning for necrotizing infection. Electronically Signed   By: Kathreen Devoid   On: 09/01/2016 14:29  Assessment/Plan  Essential hypertension BP has been elevated in the Facility. It seems he was on Norvasc and Angiotensin which was stopped when patient was in sepsis.. Will restart that. Continue to Monitor Vitals Chronic diastolic CHF  Patient had BNP of 400 in hospital His Lasix was stopped before due to dehydration and increased BUN He has been on 40 mg of Lasix Will increase the dose to 60 Mg Continue to watch weight and renal function. Diabetes type 2 BS are controlled  in facility Mostly less then 200 He is on Lantus 30 units. His A1C was elevated to 9 Will  continue to monitor Accu check. Atrial fibrillation with normal ventricular rate  Rate controlled on Metoprolol Also on Eliquis Hypokalemia supplemented Family/ staff Communication:   Labs/tests ordered:

## 2016-09-24 ENCOUNTER — Emergency Department (HOSPITAL_COMMUNITY): Payer: Medicare Other

## 2016-09-24 ENCOUNTER — Encounter (HOSPITAL_COMMUNITY): Payer: Self-pay

## 2016-09-24 ENCOUNTER — Observation Stay (HOSPITAL_COMMUNITY)
Admission: EM | Admit: 2016-09-24 | Discharge: 2016-09-26 | Disposition: A | Discharge status: Home / Self Care | Payer: Medicare Other | Attending: Family Medicine | Admitting: Family Medicine

## 2016-09-24 DIAGNOSIS — G8929 Other chronic pain: Secondary | ICD-10-CM

## 2016-09-24 DIAGNOSIS — M6281 Muscle weakness (generalized): Secondary | ICD-10-CM | POA: Insufficient documentation

## 2016-09-24 DIAGNOSIS — E11628 Type 2 diabetes mellitus with other skin complications: Secondary | ICD-10-CM | POA: Diagnosis not present

## 2016-09-24 DIAGNOSIS — I4891 Unspecified atrial fibrillation: Secondary | ICD-10-CM | POA: Diagnosis not present

## 2016-09-24 DIAGNOSIS — E1100 Type 2 diabetes mellitus with hyperosmolarity without nonketotic hyperglycemic-hyperosmolar coma (NKHHC): Secondary | ICD-10-CM

## 2016-09-24 DIAGNOSIS — R1310 Dysphagia, unspecified: Secondary | ICD-10-CM

## 2016-09-24 DIAGNOSIS — R2681 Unsteadiness on feet: Secondary | ICD-10-CM | POA: Diagnosis not present

## 2016-09-24 DIAGNOSIS — Z87891 Personal history of nicotine dependence: Secondary | ICD-10-CM | POA: Insufficient documentation

## 2016-09-24 DIAGNOSIS — R2689 Other abnormalities of gait and mobility: Secondary | ICD-10-CM | POA: Diagnosis not present

## 2016-09-24 DIAGNOSIS — Z23 Encounter for immunization: Secondary | ICD-10-CM | POA: Diagnosis not present

## 2016-09-24 DIAGNOSIS — I10 Essential (primary) hypertension: Secondary | ICD-10-CM | POA: Diagnosis present

## 2016-09-24 DIAGNOSIS — I509 Heart failure, unspecified: Secondary | ICD-10-CM

## 2016-09-24 DIAGNOSIS — E119 Type 2 diabetes mellitus without complications: Secondary | ICD-10-CM

## 2016-09-24 DIAGNOSIS — J411 Mucopurulent chronic bronchitis: Secondary | ICD-10-CM

## 2016-09-24 DIAGNOSIS — N183 Chronic kidney disease, stage 3 (moderate): Secondary | ICD-10-CM | POA: Diagnosis not present

## 2016-09-24 DIAGNOSIS — Z7901 Long term (current) use of anticoagulants: Secondary | ICD-10-CM | POA: Diagnosis not present

## 2016-09-24 DIAGNOSIS — E1122 Type 2 diabetes mellitus with diabetic chronic kidney disease: Secondary | ICD-10-CM | POA: Insufficient documentation

## 2016-09-24 DIAGNOSIS — I130 Hypertensive heart and chronic kidney disease with heart failure and stage 1 through stage 4 chronic kidney disease, or unspecified chronic kidney disease: Secondary | ICD-10-CM | POA: Diagnosis not present

## 2016-09-24 DIAGNOSIS — R079 Chest pain, unspecified: Secondary | ICD-10-CM

## 2016-09-24 DIAGNOSIS — Z951 Presence of aortocoronary bypass graft: Secondary | ICD-10-CM | POA: Insufficient documentation

## 2016-09-24 DIAGNOSIS — Z794 Long term (current) use of insulin: Secondary | ICD-10-CM | POA: Diagnosis not present

## 2016-09-24 DIAGNOSIS — Z79899 Other long term (current) drug therapy: Secondary | ICD-10-CM | POA: Insufficient documentation

## 2016-09-24 DIAGNOSIS — Z86010 Personal history of colonic polyps: Secondary | ICD-10-CM

## 2016-09-24 DIAGNOSIS — I2510 Atherosclerotic heart disease of native coronary artery without angina pectoris: Secondary | ICD-10-CM | POA: Diagnosis present

## 2016-09-24 DIAGNOSIS — E0800 Diabetes mellitus due to underlying condition with hyperosmolarity without nonketotic hyperglycemic-hyperosmolar coma (NKHHC): Secondary | ICD-10-CM

## 2016-09-24 DIAGNOSIS — M5440 Lumbago with sciatica, unspecified side: Secondary | ICD-10-CM

## 2016-09-24 DIAGNOSIS — I5033 Acute on chronic diastolic (congestive) heart failure: Secondary | ICD-10-CM | POA: Diagnosis not present

## 2016-09-24 DIAGNOSIS — E7801 Familial hypercholesterolemia: Secondary | ICD-10-CM

## 2016-09-24 DIAGNOSIS — E669 Obesity, unspecified: Secondary | ICD-10-CM

## 2016-09-24 LAB — CBC
HCT: 24.9 % — ABNORMAL LOW (ref 39.0–52.0)
HEMOGLOBIN: 8 g/dL — AB (ref 13.0–17.0)
MCH: 27.2 pg (ref 26.0–34.0)
MCHC: 32.1 g/dL (ref 30.0–36.0)
MCV: 84.7 fL (ref 78.0–100.0)
PLATELETS: 252 10*3/uL (ref 150–400)
RBC: 2.94 MIL/uL — AB (ref 4.22–5.81)
RDW: 14.3 % (ref 11.5–15.5)
WBC: 11.2 10*3/uL — AB (ref 4.0–10.5)

## 2016-09-24 LAB — CBC WITH DIFFERENTIAL/PLATELET
BASOS PCT: 1 %
Basophils Absolute: 0.1 10*3/uL (ref 0.0–0.1)
EOS ABS: 0.6 10*3/uL (ref 0.0–0.7)
EOS PCT: 5 %
HCT: 26.6 % — ABNORMAL LOW (ref 39.0–52.0)
Hemoglobin: 8.6 g/dL — ABNORMAL LOW (ref 13.0–17.0)
Lymphocytes Relative: 10 %
Lymphs Abs: 1.1 10*3/uL (ref 0.7–4.0)
MCH: 27.5 pg (ref 26.0–34.0)
MCHC: 32.3 g/dL (ref 30.0–36.0)
MCV: 85 fL (ref 78.0–100.0)
MONO ABS: 1.2 10*3/uL — AB (ref 0.1–1.0)
Monocytes Relative: 11 %
Neutro Abs: 7.9 10*3/uL — ABNORMAL HIGH (ref 1.7–7.7)
Neutrophils Relative %: 73 %
PLATELETS: 249 10*3/uL (ref 150–400)
RBC: 3.13 MIL/uL — ABNORMAL LOW (ref 4.22–5.81)
RDW: 14.5 % (ref 11.5–15.5)
WBC: 10.8 10*3/uL — ABNORMAL HIGH (ref 4.0–10.5)

## 2016-09-24 LAB — COMPREHENSIVE METABOLIC PANEL
ALBUMIN: 2.8 g/dL — AB (ref 3.5–5.0)
ALT: 13 U/L — ABNORMAL LOW (ref 17–63)
ANION GAP: 9 (ref 5–15)
AST: 16 U/L (ref 15–41)
Alkaline Phosphatase: 113 U/L (ref 38–126)
BILIRUBIN TOTAL: 0.5 mg/dL (ref 0.3–1.2)
BUN: 13 mg/dL (ref 6–20)
CO2: 32 mmol/L (ref 22–32)
Calcium: 9.9 mg/dL (ref 8.9–10.3)
Chloride: 91 mmol/L — ABNORMAL LOW (ref 101–111)
Creatinine, Ser: 1.31 mg/dL — ABNORMAL HIGH (ref 0.61–1.24)
GFR calc Af Amer: 56 mL/min — ABNORMAL LOW (ref >60)
GFR calc non Af Amer: 49 mL/min — ABNORMAL LOW (ref >60)
GLUCOSE: 72 mg/dL (ref 65–99)
POTASSIUM: 3.1 mmol/L — AB (ref 3.5–5.1)
SODIUM: 132 mmol/L — AB (ref 135–145)
TOTAL PROTEIN: 6.4 g/dL — AB (ref 6.5–8.1)

## 2016-09-24 LAB — CBG MONITORING, ED
GLUCOSE-CAPILLARY: 115 mg/dL — AB (ref 65–99)
Glucose-Capillary: 53 mg/dL — ABNORMAL LOW (ref 65–99)
Glucose-Capillary: 135 mg/dL — ABNORMAL HIGH (ref 65–99)

## 2016-09-24 LAB — BASIC METABOLIC PANEL
ANION GAP: 9 (ref 5–15)
BUN: 13 mg/dL (ref 6–20)
CHLORIDE: 93 mmol/L — AB (ref 101–111)
CO2: 33 mmol/L — ABNORMAL HIGH (ref 22–32)
CREATININE: 1.32 mg/dL — AB (ref 0.61–1.24)
Calcium: 10 mg/dL (ref 8.9–10.3)
GFR calc Af Amer: 56 mL/min — ABNORMAL LOW (ref >60)
GFR calc non Af Amer: 48 mL/min — ABNORMAL LOW (ref >60)
Glucose, Bld: 54 mg/dL — ABNORMAL LOW (ref 65–99)
POTASSIUM: 3.2 mmol/L — AB (ref 3.5–5.1)
SODIUM: 135 mmol/L (ref 135–145)

## 2016-09-24 LAB — GLUCOSE, CAPILLARY
Glucose-Capillary: 150 mg/dL — ABNORMAL HIGH (ref 65–99)
Glucose-Capillary: 183 mg/dL — ABNORMAL HIGH (ref 65–99)

## 2016-09-24 LAB — TROPONIN I
Troponin I: <0.03 ng/mL (ref <0.03)
Troponin I: <0.03 ng/mL (ref <0.03)

## 2016-09-24 LAB — BRAIN NATRIURETIC PEPTIDE: B Natriuretic Peptide: 476 pg/mL — ABNORMAL HIGH (ref 0.0–100.0)

## 2016-09-24 LAB — D-DIMER, QUANTITATIVE (NOT AT ARMC): D DIMER QUANT: 0.94 ug{FEU}/mL — AB (ref 0.00–0.50)

## 2016-09-24 LAB — HEMOGLOBIN A1C
Hgb A1c MFr Bld: 8.5 % — ABNORMAL HIGH (ref 4.8–5.6)
Mean Plasma Glucose: 197.25 mg/dL

## 2016-09-24 LAB — PROTIME-INR
INR: 1.37
Prothrombin Time: 16.8 seconds — ABNORMAL HIGH (ref 11.4–15.2)

## 2016-09-24 MED ORDER — CLONIDINE HCL 0.1 MG PO TABS
0.1000 mg | ORAL_TABLET | Freq: Two times a day (BID) | ORAL | Status: DC
  Administered 2016-09-24 – 2016-09-26 (×5): 0.1 mg via ORAL
  Filled 2016-09-24 (×5): qty 1

## 2016-09-24 MED ORDER — IPRATROPIUM-ALBUTEROL 0.5-2.5 (3) MG/3ML IN SOLN: 3.0000 mL | Freq: Four times a day (QID) | RESPIRATORY_TRACT | Status: DC

## 2016-09-24 MED ORDER — ONDANSETRON HCL 4 MG/2ML IJ SOLN: 4.0000 mg | Freq: Four times a day (QID) | INTRAMUSCULAR | Status: DC | PRN

## 2016-09-24 MED ORDER — OXYCODONE HCL 5 MG PO TABS
15.0000 mg | ORAL_TABLET | Freq: Three times a day (TID) | ORAL | Status: DC | PRN
  Administered 2016-09-24 – 2016-09-25 (×2): 15 mg via ORAL
  Filled 2016-09-24 (×2): qty 3

## 2016-09-24 MED ORDER — SODIUM CHLORIDE 0.9% FLUSH
3.0000 mL | Freq: Two times a day (BID) | INTRAVENOUS | Status: DC
  Administered 2016-09-24 – 2016-09-26 (×4): 3 mL via INTRAVENOUS

## 2016-09-24 MED ORDER — INFLUENZA VAC SPLIT HIGH-DOSE 0.5 ML IM SUSY
0.5000 mL | PREFILLED_SYRINGE | INTRAMUSCULAR | Status: AC
  Administered 2016-09-25: 0.5 mL via INTRAMUSCULAR
  Filled 2016-09-24: qty 0.5

## 2016-09-24 MED ORDER — ORAL CARE MOUTH RINSE
15.0000 mL | Freq: Four times a day (QID) | OROMUCOSAL | Status: DC
  Administered 2016-09-24 – 2016-09-25 (×3): 15 mL via OROMUCOSAL

## 2016-09-24 MED ORDER — POTASSIUM CHLORIDE CRYS ER 20 MEQ PO TBCR
20.0000 meq | EXTENDED_RELEASE_TABLET | Freq: Every day | ORAL | Status: DC
  Administered 2016-09-24 – 2016-09-26 (×3): 20 meq via ORAL
  Filled 2016-09-24 (×3): qty 1

## 2016-09-24 MED ORDER — APIXABAN 5 MG PO TABS: 5.0000 mg | ORAL_TABLET | Freq: Two times a day (BID) | ORAL | Status: DC

## 2016-09-24 MED ORDER — AMLODIPINE BESYLATE 5 MG PO TABS
10.0000 mg | ORAL_TABLET | Freq: Every day | ORAL | Status: DC
  Administered 2016-09-24 – 2016-09-26 (×3): 10 mg via ORAL
  Filled 2016-09-24 (×3): qty 2

## 2016-09-24 MED ORDER — PANTOPRAZOLE SODIUM 40 MG PO TBEC
40.0000 mg | DELAYED_RELEASE_TABLET | Freq: Every day | ORAL | Status: DC
  Administered 2016-09-24 – 2016-09-26 (×3): 40 mg via ORAL
  Filled 2016-09-24 (×3): qty 1

## 2016-09-24 MED ORDER — TAMSULOSIN HCL 0.4 MG PO CAPS
0.4000 mg | ORAL_CAPSULE | Freq: Every day | ORAL | Status: DC
  Administered 2016-09-25 – 2016-09-26 (×2): 0.4 mg via ORAL
  Filled 2016-09-24 (×2): qty 1

## 2016-09-24 MED ORDER — IPRATROPIUM-ALBUTEROL 0.5-2.5 (3) MG/3ML IN SOLN: 3.0000 mL | Freq: Four times a day (QID) | RESPIRATORY_TRACT | Status: DC | PRN

## 2016-09-24 MED ORDER — BIOTENE DRY MOUTH MT LIQD: 15.0000 mL | Freq: Four times a day (QID) | OROMUCOSAL | Status: DC

## 2016-09-24 MED ORDER — POTASSIUM CHLORIDE 20 MEQ/15ML (10%) PO SOLN
ORAL | Status: AC
  Filled 2016-09-24: qty 30

## 2016-09-24 MED ORDER — INSULIN ASPART 100 UNIT/ML ~~LOC~~ SOLN
0.0000 [IU] | Freq: Three times a day (TID) | SUBCUTANEOUS | Status: DC
Start: 2016-09-24 — End: 2016-09-26
  Administered 2016-09-24: 1 [IU] via SUBCUTANEOUS
  Administered 2016-09-25: 2 [IU] via SUBCUTANEOUS
  Administered 2016-09-25: 1 [IU] via SUBCUTANEOUS
  Administered 2016-09-25: 2 [IU] via SUBCUTANEOUS

## 2016-09-24 MED ORDER — INSULIN GLARGINE 100 UNIT/ML ~~LOC~~ SOLN
30.0000 [IU] | Freq: Every day | SUBCUTANEOUS | Status: DC
  Administered 2016-09-24 – 2016-09-25 (×2): 30 [IU] via SUBCUTANEOUS
  Filled 2016-09-24 (×4): qty 0.3

## 2016-09-24 MED ORDER — ASPIRIN 81 MG PO CHEW
324.0000 mg | CHEWABLE_TABLET | Freq: Once | ORAL | Status: AC
  Administered 2016-09-24: 324 mg via ORAL
  Filled 2016-09-24: qty 4

## 2016-09-24 MED ORDER — ONDANSETRON HCL 4 MG PO TABS: 4.0000 mg | ORAL_TABLET | Freq: Four times a day (QID) | ORAL | Status: DC | PRN

## 2016-09-24 MED ORDER — ROSUVASTATIN CALCIUM 10 MG PO TABS
10.0000 mg | ORAL_TABLET | Freq: Every day | ORAL | Status: DC
  Administered 2016-09-24 – 2016-09-26 (×3): 10 mg via ORAL
  Filled 2016-09-24 (×4): qty 1

## 2016-09-24 MED ORDER — FUROSEMIDE 10 MG/ML IJ SOLN
20.0000 mg | Freq: Two times a day (BID) | INTRAMUSCULAR | Status: DC
  Administered 2016-09-24 – 2016-09-26 (×5): 20 mg via INTRAVENOUS
  Filled 2016-09-24 (×5): qty 2

## 2016-09-24 MED ORDER — METOPROLOL SUCCINATE ER 25 MG PO TB24
75.0000 mg | ORAL_TABLET | Freq: Two times a day (BID) | ORAL | Status: DC
  Administered 2016-09-24 – 2016-09-26 (×5): 75 mg via ORAL
  Filled 2016-09-24 (×7): qty 1

## 2016-09-24 MED ORDER — PRO-STAT SUGAR FREE PO LIQD
30.0000 mL | Freq: Two times a day (BID) | ORAL | Status: DC
  Administered 2016-09-24 – 2016-09-25 (×3): 30 mL via ORAL
  Filled 2016-09-24 (×6): qty 30

## 2016-09-24 MED ORDER — FLUOXETINE HCL 20 MG/5ML PO SOLN
20.0000 mg | Freq: Every day | ORAL | Status: DC
  Administered 2016-09-24 – 2016-09-26 (×3): 20 mg via ORAL
  Filled 2016-09-24 (×5): qty 5

## 2016-09-24 MED ORDER — FUROSEMIDE 10 MG/ML IJ SOLN
40.0000 mg | Freq: Once | INTRAMUSCULAR | Status: AC
  Administered 2016-09-24: 40 mg via INTRAVENOUS
  Filled 2016-09-24: qty 4

## 2016-09-24 MED ORDER — APIXABAN 5 MG PO TABS
5.0000 mg | ORAL_TABLET | Freq: Two times a day (BID) | ORAL | Status: DC
  Administered 2016-09-24 – 2016-09-25 (×4): 5 mg via ORAL
  Filled 2016-09-24 (×6): qty 1

## 2016-09-24 MED ORDER — IRBESARTAN 75 MG PO TABS
75.0000 mg | ORAL_TABLET | Freq: Every day | ORAL | Status: DC
  Administered 2016-09-25 – 2016-09-26 (×2): 75 mg via ORAL
  Filled 2016-09-24 (×4): qty 1

## 2016-09-24 MED ORDER — POTASSIUM CHLORIDE 10 MEQ/100ML IV SOLN
10.0000 meq | INTRAVENOUS | Status: AC
  Administered 2016-09-24 (×3): 10 meq via INTRAVENOUS
  Filled 2016-09-24 (×3): qty 100

## 2016-09-24 MED ORDER — SODIUM CHLORIDE 0.9% FLUSH: 3.0000 mL | INTRAVENOUS | Status: DC | PRN

## 2016-09-24 MED ORDER — IPRATROPIUM-ALBUTEROL 0.5-2.5 (3) MG/3ML IN SOLN
3.0000 mL | Freq: Four times a day (QID) | RESPIRATORY_TRACT | Status: DC
  Administered 2016-09-24 – 2016-09-26 (×7): 3 mL via RESPIRATORY_TRACT
  Filled 2016-09-24 (×9): qty 3

## 2016-09-24 MED ORDER — LORAZEPAM 0.5 MG PO TABS
0.5000 mg | ORAL_TABLET | Freq: Four times a day (QID) | ORAL | Status: DC
  Administered 2016-09-24 – 2016-09-26 (×8): 0.5 mg via ORAL
  Filled 2016-09-24 (×8): qty 1

## 2016-09-24 MED ORDER — SODIUM CHLORIDE 0.9 % IV SOLN: 250.0000 mL | INTRAVENOUS | Status: DC | PRN

## 2016-09-24 NOTE — ED Triage Notes (Signed)
Pt c/o sob and chest pain that started 30 mins ago

## 2016-09-24 NOTE — ED Notes (Signed)
Pt having difficulty swallowing.  Able to swallow fluids well.  Dr Cindie Laroche paged to be informed.  Well crush pills and give with applesauce.

## 2016-09-24 NOTE — ED Notes (Signed)
Pt having difficulty swallowing.  Able to swallow fluids well.

## 2016-09-24 NOTE — H&P (Addendum)
TRH H&P    Patient Demographics:    [name], is a 83 y.o. male  MRN: [id number]  DOB - August 10, 1931  Admit Date - 09/24/2016  Referring MD/NP/PA: Dr.Rancour  Outpatient Primary MD for the patient is [name], MD  Patient coming from: Skilled nursing facility  Chief Complaint  Patient presents with  . Shortness of Breath  . Chest Pain      HPI:    [name]  is a 83 y.o. male, With history of left foot cellulitis and abscess status post incision and drainage, on IV antibiotics vancomycin and Zosyn completed on 16/57/9038, diastolic CHF, hypertension, hyperlipidemia, atrial fibrillation, on long-term anticoagulation with eliquis who was recently discharged to skilled nursing facility on IV antibiotics. The patient was found to be short of breath, also complained of chest pain. He was sent to ED for further evaluation. He complains of nausea but no vomiting. At this time chest pain has resolved.  In the ED chest x-ray showed mild interstitial edema and small pleural effusions. Patient given Lasix in the ED. He complains of dysuria, no abdominal pain. No diarrhea. Denies dizziness or passing out    Review of systems:      All other systems reviewed and are negative.   With Past History of the following :    Past Medical History:  Diagnosis Date  . BPH (benign prostatic hyperplasia)   . CAD (coronary artery disease)    Multivessel status post CABG 2011 - LIMA to LAD, SVG to diagonal, SVG to OM, SVG to PDA  . Cellulitis    12/15  . Chronic back pain   . Chronic diastolic CHF (congestive heart failure) ([place])   . CKD (chronic kidney disease), stage III   . COPD (chronic obstructive pulmonary disease) ([place])   . Essential hypertension   . Gastric mass    EGD 9/15  . GERD (gastroesophageal reflux disease)   . History of DVT (deep vein thrombosis)    Postphlebitic syndrome  .  History of kidney stones   . HOH (hard of hearing)   . Hx of CABG   . Hyperlipidemia   . Persistent atrial fibrillation ([place])    a. s/p DCCV 03/2016.  [name] Sleep apnea    Stop Bang score of 5  . Type 2 diabetes mellitus ([place])       Past Surgical History:  Procedure Laterality Date  . CARDIOVERSION N/A 03/20/2016   Procedure: CARDIOVERSION;  Surgeon: Arnoldo Lenis, MD;  Location: [facility];  Service: Endoscopy;  Laterality: N/A;  . CATARACT EXTRACTION W/PHACO Left 02/07/2016   Procedure: CATARACT EXTRACTION PHACO AND INTRAOCULAR LENS PLACEMENT (IOC);  Surgeon: [name], MD;  Location: [facility];  Service: Ophthalmology;  Laterality: Left;  CDE:  23.13  . COLONOSCOPY  2004   Dr. Laural Golden: three small polyps at cecum, path unknown, external hemorrhoids  . COLONOSCOPY N/A 08/16/2013   Dr. [name]: incomplete prep. multiple tubular adenomas, multiple biopsies. Needs surveillance in Aug 2016 due to poor prep  . CORONARY ARTERY BYPASS  GRAFT     x5  . CORONARY ARTERY BYPASS GRAFT  2011  . CYSTOSCOPY N/A 12/12/2012   Procedure: CYSTOSCOPY FLEXIBLE;  Surgeon: Marissa Nestle, MD;  Location: [facility];  Service: Urology;  Laterality: N/A;  . ESOPHAGEAL DILATION N/A 09/14/2016   Procedure: ESOPHAGEAL DILATION;  Surgeon: Daneil Dolin, MD;  [facility];  Service: Endoscopy;  Laterality: N/A;  . ESOPHAGOGASTRODUODENOSCOPY N/A 08/16/2013   Dr. [name]: normal esophagus s/p Maloney dilation, gastric erosions, submucosal gastric mass vs extrinsic mass  . ESOPHAGOGASTRODUODENOSCOPY (EGD) WITH PROPOFOL N/A 09/14/2016   Procedure: ESOPHAGOGASTRODUODENOSCOPY (EGD) WITH PROPOFOL;  Surgeon: Daneil Dolin, MD;  [facility];  Service: Endoscopy;  Laterality: N/A;  . EUS N/A 09/07/2013   Dr. 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 INCISION AND DRAINAGE ABSCESS N/A 12/20/2013   Procedure: INCISION AND DRAINAGE ABSCESS NECK;  Surgeon: Jamesetta So, MD;  Location: [facility];  Service: General;  Laterality: N/A;  . INCISION AND DRAINAGE ABSCESS Left 09/04/2016   Procedure: INCISION AND DRAINAGE ABSCESS;  Surgeon: Aviva Signs, MD;  Location: [facility];  Service: General;  Laterality: Left;  [name] MALONEY DILATION N/A 08/16/2013   Procedure: Venia Minks DILATION;  Surgeon: Daneil Dolin, MD;  Location[facility];  Service: Endoscopy;  Laterality: N/A;      Social History:      Social History  Substance Use Topics  . Smoking status: Former Smoker    Packs/day: 1.00    Years: 11.00    Quit date: 01/05/1956  . Smokeless tobacco: Never Used  . Alcohol use No       Family History :     Family History  Problem Relation Age of Onset  . Colon cancer Son 3       deceased      Home Medications:   Prior to Admission medications   Medication Sig Start Date End Date Taking? Authorizing Provider  Amino Acids-Protein Hydrolys (FEEDING SUPPLEMENT, PRO-STAT SUGAR FREE 64,) LIQD Take 30 mLs by mouth 2 (two) times daily between meals.    [provider]  antiseptic oral rinse (BIOTENE) LIQD 15 mLs by Mouth Rinse route 4 (four) times daily.    [provider]  apixaban (ELIQUIS) 5 MG TABS tablet Take 5 mg by mouth 2 (two) times daily.    [provider]  cloNIDine (CATAPRES) 0.1 MG tablet Take 0.1 mg by mouth 2 (two) times daily.    [provider]  clotrimazole (LOTRIMIN) 1 % cream Apply topically every morning. 09/16/16   Dondiego, Richard, MD  docusate sodium (COLACE) 100 MG capsule Take 100 mg by mouth 2 (two) times daily.    [provider]  Ferrous Sulfate (IRON) 325 (65 Fe) MG TABS Give 1 tablet by mouth once a day    [provider]  FLUoxetine (PROZAC) 20 MG/5ML solution Take 5 mLs (20 mg total) by mouth daily. 09/09/16   [name], MD  furosemide (LASIX) 40 MG tablet Take 40 mg by mouth daily.    [provider]  insulin glargine (LANTUS) 100 UNIT/ML injection Inject 0.3 mLs (30 Units  total) into the skin at bedtime. 09/08/16   [name], MD  ipratropium-albuterol (DUONEB) 0.5-2.5 (3) MG/3ML SOLN Take 3 mLs by nebulization every 6 (six) hours as needed (wheezing).    [provider]  metoprolol succinate (TOPROL-XL) 25 MG 24 hr tablet Take 3 tablets (75 mg total) by mouth  daily. Take with or immediately following a meal. 06/16/16   [name], MD  nitroGLYCERIN (NITROSTAT) 0.4 MG SL tablet Place 1 tablet (0.4 mg total) under the tongue every 5 (five) minutes as needed for chest pain. 12/22/13   [name], MD  oxyCODONE (ROXICODONE) 15 MG immediate release tablet Take 15 mg by mouth every 8 (eight) hours as needed for pain.     [provider]  pantoprazole (PROTONIX) 40 MG tablet Take 1 tablet (40 mg total) by mouth daily. 09/16/16   [name], MD  piperacillin-tazobactam (ZOSYN) 3-0.375 GM/50ML IVPB Inject 3.375 g into the vein every 8 (eight) hours. Until 09/22/2016    [provider]  potassium chloride SA (K-DUR,KLOR-CON) 20 MEQ tablet Take 60 mEq by mouth daily.    [provider]  rosuvastatin (CRESTOR) 10 MG tablet Take 10 mg by mouth daily.    [provider]  Sodium Chloride Flush (NORMAL SALINE FLUSH) 0.9 % SOLN Flush PICC with 5cc normal saline before and after IV ABO administration four times a day    [provider]  vancomycin 1,000 mg in sodium chloride 0.9 % 250 mL Inject 1,000 mg into the vein daily. Until 09/22/2016    [provider]     Allergies:    No Known Allergies   Physical Exam:   Vitals  Blood pressure (!) 150/78, pulse 97, resp. rate 15, height 5\' 11"  (1.803 m), weight 97.5 kg (215 lb), SpO2 100 %.  1.  General: Appears in no acute distress  2. Psychiatric:  Intact judgement and  insight, awake alert, oriented x 3.  3. Neurologic: No focal neurological deficits, all cranial nerves intact.Strength 5/5 all 4 extremities, sensation intact all 4  extremities, plantars down going.  4. Eyes :  anicteric sclerae, moist conjunctivae with no lid lag. PERRLA.  5. ENMT:  Oropharynx clear with moist mucous membranes and good dentition  6. Neck:  supple, no cervical lymphadenopathy appriciated, No thyromegaly  7. Respiratory : Normal respiratory effort, bibasilar crackles auscultated  8. Cardiovascular : RRR, no gallops, rubs or murmurs, trace leg edema bilaterally  9. Gastrointestinal:  Positive bowel sounds, abdomen soft, non-tender to palpation,no hepatosplenomegaly, no rigidity or guarding       10. Skin:  Left foot- open wound with packing in place noted on the plantar aspect of left foot.  11.Musculoskeletal:  Good muscle tone,  joints appear normal , no effusions,  normal range of motion    Data Review:    CBC  Recent Labs Lab 09/17/16 0717 09/22/16 0700 09/24/16 0021  WBC 12.9* 12.6* 10.8*  HGB 9.1* 8.7* 8.6*  HCT 28.7* 27.7* 26.6*  PLT 369 323 249  MCV 85.7 85.8 85.0  MCH 27.2 26.9 27.5  MCHC 31.7 31.4 32.3  RDW 14.0 14.3 14.5  LYMPHSABS 1.2 0.9 1.1  MONOABS 1.1* 0.9 1.2*  EOSABS 0.7 1.0* 0.6  BASOSABS 0.1 0.1 0.1   ------------------------------------------------------------------------------------------------------------------  Chemistries   Recent Labs Lab 09/17/16 0717 09/20/16 1730 09/22/16 0700 09/24/16 0021  NA 137  --  136 132*  K 3.2*  --  3.2* 3.1*  CL 95*  --  94* 91*  CO2 33*  --  30 32  GLUCOSE 93  --  162* 72  BUN 9 16 16 13   CREATININE 1.22 1.47* 1.41* 1.31*  CALCIUM 10.3  --  10.5* 9.9  AST  --   --   --  16  ALT  --   --   --  13*  ALKPHOS  --   --   --  113  BILITOT  --   --   --  0.5   ------------------------------------------------------------------------------------------------------------------  ------------------------------------------------------------------------------------------------------------------ GFR: Estimated Creatinine Clearance: 50.9 mL/min (A)  (by C-G formula based on SCr of 1.31 mg/dL (H)). Liver Function Tests:  Recent Labs Lab 09/24/16 0021  AST 16  ALT 13*  ALKPHOS 113  BILITOT 0.5  PROT 6.4*  ALBUMIN 2.8*   No results for input(s): LIPASE, AMYLASE in the last 168 hours. No results for input(s): AMMONIA in the last 168 hours. Coagulation Profile:  Recent Labs Lab 09/24/16 0021  INR 1.37   Cardiac Enzymes:  Recent Labs Lab 09/24/16 0021  TROPONINI <0.03   BNP (last 3 results) No results for input(s): PROBNP in the last 8760 hours. HbA1C: No results for input(s): HGBA1C in the last 72 hours. CBG: No results for input(s): GLUCAP in the last 168 hours. Lipid Profile: No results for input(s): CHOL, HDL, LDLCALC, TRIG, CHOLHDL, LDLDIRECT in the last 72 hours. Thyroid Function Tests: No results for input(s): TSH, T4TOTAL, FREET4, T3FREE, THYROIDAB in the last 72 hours. Anemia Panel: No results for input(s): VITAMINB12, FOLATE, FERRITIN, TIBC, IRON, RETICCTPCT in the last 72 hours.  --------------------------------------------------------------------------------------------------------------- Urine analysis:    Component Value Date/Time   COLORURINE YELLOW 09/01/2016 1545   APPEARANCEUR HAZY (A) 09/01/2016 1545   LABSPEC 1.012 09/01/2016 1545   PHURINE 5.0 09/01/2016 1545   GLUCOSEU >=500 (A) 09/01/2016 1545   HGBUR SMALL (A) 09/01/2016 1545   BILIRUBINUR NEGATIVE 09/01/2016 1545   KETONESUR 5 (A) 09/01/2016 1545   PROTEINUR 100 (A) 09/01/2016 1545   UROBILINOGEN 0.2 12/19/2013 1905   NITRITE NEGATIVE 09/01/2016 1545   LEUKOCYTESUR NEGATIVE 09/01/2016 1545      Imaging Results:    Dg Chest 2 View  Result Date: 09/24/2016 CLINICAL DATA:  Shortness of breath and chest pain EXAM: CHEST  2 VIEW COMPARISON:  09/12/2016 FINDINGS: Post sternotomy changes. Small bilateral pleural effusions. Mild cardiomegaly with diffuse increased interstitial opacities suspicious for mild pulmonary edema. Right upper  extremity catheter tip overlies the distal SVC. Atherosclerotic calcifications. No pneumothorax. IMPRESSION: Cardiomegaly with small bilateral pleural effusions. Diffuse increased interstitial opacity, suspicious for mild pulmonary edema. Electronically Signed   By: Donavan Foil M.D.   On: 09/24/2016 01:14    My personal review of EKG: Rhythm Atrial fibrillation   Assessment & Plan:    Active Problems:   Essential hypertension   Atrial fibrillation with normal ventricular rate (HCC)   Type 2 diabetes mellitus ([facility])   CAD (coronary artery disease)   CHF exacerbation ([place])   1. Acute on chronic diastolic CHF- patient presented with worsening shortness of breath, chest x-ray showed pulmonary edema. Patient received Lasix in the ED. We'll start Lasix 20 milli grams IV every 12 hours from tomorrow morning. BNP 476. 2. Hypokalemia-potassium is 3.1, likely from diuresis. Will start IV KCl 10 meq 3. Follow BMP in a.m. 3. Chest pain-resolved, initial troponin is negative in the ED. We'll cycle troponin every 6 hours 3 4. Atrial fibrillation-heart rate is controlled, continue eliquis for anticoagulation. 5. Left foot ulcer-status post incision and drainage, completed IV vancomycin and Zosyn. We'll consult wound care 6. Diabetes mellitus-continue Lantus 30 units subcutaneous daily, will start sliding scale insulin with NovoLog.    DVT Prophylaxis-  Eliquis  AM Labs Ordered, also please review Full Orders  Family Communication: Admission, patients condition and plan of care including tests being ordered have been discussed  with the patient and His friend at bedside who indicate understanding and agree with the plan and Code Status.  Code Status:  DO NOT RESUSCITATE  Admission status: Observation    Time spent in minutes : 60 minutes   Naquan Garman S M.D on 09/24/2016 at 3:03 AM  Between 7am to 7pm - Pager - [phone]. After 7pm go to [email or web address] - password [facility]  [facility] -  Office  [phone]

## 2016-09-24 NOTE — ED Provider Notes (Signed)
[facility] DEPT Provider Note   CSN: [id number] Arrival date & time: 09/24/16  0003     History   Chief Complaint Chief Complaint  Patient presents with  . Shortness of Breath  . Chest Pain    HPI [name] is a 83 y.o. male.  Patient presents from nursing home with episode of chest pain and shortness of breath that started several hours ago tonight while resting. Patient has a history of CAD status post CABG, diastolic heart failure, COPD. He has been wearing oxygen at his facility. States he's had a cough productive of clear mucus. Has some right-sided chest pain which is since improved. He states he's never had this kind of pain before. Patient with multiple recent hospitalizations secondary to cellulitis of his left foot after penetrating injury. He completed a course of IV antibiotics on September 12. He feels that his foot is improving. He is also anticoagulated with Eliquis. History of DVT.EMS did not give any medications for chest pain. Patient still feels short of breath though his right-sided chest pain is improving.   The history is provided by the patient and the EMS personnel.  Shortness of Breath  Associated symptoms include chest pain. Pertinent negatives include no fever, no headaches, no rhinorrhea, no vomiting and no abdominal pain.  Chest Pain   Associated symptoms include shortness of breath. Pertinent negatives include no abdominal pain, no dizziness, no fever, no headaches, no nausea, no vomiting and no weakness.    Past Medical History:  Diagnosis Date  . BPH (benign prostatic hyperplasia)   . CAD (coronary artery disease)    Multivessel status post CABG 2011 - LIMA to LAD, SVG to diagonal, SVG to OM, SVG to PDA  . Cellulitis    12/15  . Chronic back pain   . Chronic diastolic CHF (congestive heart failure) ([place])   . CKD (chronic kidney disease), stage III   . COPD (chronic obstructive pulmonary disease) ([place])   . Essential hypertension   .  Gastric mass    EGD 9/15  . GERD (gastroesophageal reflux disease)   . History of DVT (deep vein thrombosis)    Postphlebitic syndrome  . History of kidney stones   . HOH (hard of hearing)   . Hx of CABG   . Hyperlipidemia   . Persistent atrial fibrillation ([place])    a. s/p DCCV 03/2016.  [name] Sleep apnea    Stop Bang score of 5  . Type 2 diabetes mellitus [facility])     Patient Active Problem List   Diagnosis Date Noted  . Cellulitis and abscess of toe of left foot   . HCAP (healthcare-associated pneumonia) 09/09/2016  . COPD (chronic obstructive pulmonary disease) ([place]) 09/09/2016  . CAD (coronary artery disease) 09/09/2016  . Hyperlipidemia 09/09/2016  . Diabetic foot infection ([place]) 09/01/2016  . Type 2 diabetes mellitus ([place]) 07/24/2016  . Pressure injury of skin 06/11/2016  . Atrial fibrillation with normal ventricular rate ([place]) 06/10/2016  . Esophageal stricture 06/09/2016  . CAD in native artery 06/08/2016  . CKD (chronic kidney disease), stage III 06/08/2016  . Chronic diastolic CHF (congestive heart failure) ([place]) 03/19/2016  . CAP (community acquired pneumonia) 03/19/2016  . Abnormal weight loss   . Thrush, oral 12/18/2013  . Chest pain at rest 12/18/2013  . Leukocytosis 12/18/2013  . Diabetes ([place]) 12/18/2013  . Chronic back pain 12/18/2013  . Dysphagia 12/18/2013  . Odynophagia 12/18/2013  . Gastric mass 09/07/2013  . Personal history of colonic  polyps 08/05/2013  . Nausea and vomiting 08/01/2013  . UTI (lower urinary tract infection) 12/09/2012  . Lower extremity edema 06/02/2012  . Essential hypertension 06/02/2012  . History of DVT (deep vein thrombosis) 06/02/2012  . Obesity (BMI 30-39.9): BMI 31.3 06/02/2012    Past Surgical History:  Procedure Laterality Date  . CARDIOVERSION N/A 03/20/2016   Procedure: CARDIOVERSION;  Surgeon: Arnoldo Lenis, MD;  [facility];  Service: Endoscopy;  Laterality: N/A;  . CATARACT EXTRACTION W/PHACO Left  02/07/2016   Procedure: CATARACT EXTRACTION PHACO AND INTRAOCULAR LENS PLACEMENT (IOC);  Surgeon: [name], MD;  Location: [facility] ORS;  Service: Ophthalmology;  Laterality: Left;  CDE:  23.13  . COLONOSCOPY  2004   Dr. Laural Golden: three small polyps at cecum, path unknown, external hemorrhoids  . COLONOSCOPY N/A 08/16/2013   Dr. [name]: incomplete prep. multiple tubular adenomas, multiple biopsies. Needs surveillance in Aug 2016 due to poor prep  . CORONARY ARTERY BYPASS GRAFT     x5  . CORONARY ARTERY BYPASS GRAFT  2011  . CYSTOSCOPY N/A 12/12/2012   Procedure: CYSTOSCOPY FLEXIBLE;  Surgeon: Marissa Nestle, MD;  Location: [facility] OR[facility];  Service: Urology;  Laterality: N/A;  . ESOPHAGEAL DILATION N/A 09/14/2016   Procedure: ESOPHAGEAL DILATION;  Surgeon: Daneil Dolin, MD;  Location[facility];  Service: Endoscopy;  Laterality: N/A;  . ESOPHAGOGASTRODUODENOSCOPY N/A 08/16/2013   Dr. [name]: normal esophagus s/p Maloney dilation, gastric erosions, submucosal gastric mass vs extrinsic mass  . ESOPHAGOGASTRODUODENOSCOPY (EGD) WITH PROPOFOL N/A 09/14/2016   Procedure: ESOPHAGOGASTRODUODENOSCOPY (EGD) WITH PROPOFOL;  Surgeon: Daneil Dolin, MD;  [facility];  Service: Endoscopy;  Laterality: N/A;  . EUS N/A 09/07/2013   Dr. 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 INCISION AND DRAINAGE ABSCESS N/A 12/20/2013   Procedure: INCISION AND DRAINAGE ABSCESS NECK;  Surgeon: Jamesetta So, MD;  Location: [facility] OR[facility];  Service: General;  Laterality: N/A;  . INCISION AND DRAINAGE ABSCESS Left 09/04/2016   Procedure: INCISION AND DRAINAGE ABSCESS;  Surgeon: Aviva Signs, MD;  Location: [facility] ORS;  Service: General;  Laterality: Left;  [name] MALONEY DILATION N/A 08/16/2013   Procedure: Venia Minks DILATION;  Surgeon: Daneil Dolin, MD;  Location: [facility];  Service: Endoscopy;  Laterality: N/A;       Home Medications    Prior to Admission medications   Medication Sig Start Date End Date  Taking? Authorizing Provider  Amino Acids-Protein Hydrolys (FEEDING SUPPLEMENT, PRO-STAT SUGAR FREE 64,) LIQD Take 30 mLs by mouth 2 (two) times daily between meals.    [provider]  antiseptic oral rinse (BIOTENE) LIQD 15 mLs by Mouth Rinse route 4 (four) times daily.    [provider]  apixaban (ELIQUIS) 5 MG TABS tablet Take 5 mg by mouth 2 (two) times daily.    [provider]  cloNIDine (CATAPRES) 0.1 MG tablet Take 0.1 mg by mouth 2 (two) times daily.    [provider]  clotrimazole (LOTRIMIN) 1 % cream Apply topically every morning. 09/16/16   Dondiego, Richard, MD  docusate sodium (COLACE) 100 MG capsule Take 100 mg by mouth 2 (two) times daily.    [provider]  Ferrous Sulfate (IRON) 325 (65 Fe) MG TABS Give 1 tablet by mouth once a day    [provider]  FLUoxetine (PROZAC) 20 MG/5ML solution Take 5 mLs (20 mg total) by mouth daily. 09/09/16   [name], MD  furosemide (  LASIX) 40 MG tablet Take 40 mg by mouth daily.    [provider]  insulin glargine (LANTUS) 100 UNIT/ML injection Inject 0.3 mLs (30 Units total) into the skin at bedtime. 09/08/16   [name], MD  ipratropium-albuterol (DUONEB) 0.5-2.5 (3) MG/3ML SOLN Take 3 mLs by nebulization every 6 (six) hours as needed (wheezing).    [provider]  metoprolol succinate (TOPROL-XL) 25 MG 24 hr tablet Take 3 tablets (75 mg total) by mouth daily. Take with or immediately following a meal. 06/16/16   [name], MD  nitroGLYCERIN (NITROSTAT) 0.4 MG SL tablet Place 1 tablet (0.4 mg total) under the tongue every 5 (five) minutes as needed for chest pain. 12/22/13   [name], MD  oxyCODONE (ROXICODONE) 15 MG immediate release tablet Take 15 mg by mouth every 8 (eight) hours as needed for pain.     [provider]  pantoprazole (PROTONIX) 40 MG tablet Take 1 tablet (40 mg total) by mouth daily. 09/16/16   [name], MD    piperacillin-tazobactam (ZOSYN) 3-0.375 GM/50ML IVPB Inject 3.375 g into the vein every 8 (eight) hours. Until 09/22/2016    [provider]  potassium chloride SA (K-DUR,KLOR-CON) 20 MEQ tablet Take 60 mEq by mouth daily.    [provider]  rosuvastatin (CRESTOR) 10 MG tablet Take 10 mg by mouth daily.    [provider]  Sodium Chloride Flush (NORMAL SALINE FLUSH) 0.9 % SOLN Flush PICC with 5cc normal saline before and after IV ABO administration four times a day    [provider]  vancomycin 1,000 mg in sodium chloride 0.9 % 250 mL Inject 1,000 mg into the vein daily. Until 09/22/2016    [provider]    Family History Family History  Problem Relation Age of Onset  . Colon cancer Son 33       deceased    Social History Social History  Substance Use Topics  . Smoking status: Former Smoker    Packs/day: 1.00    Years: 11.00    Quit date: 01/05/1956  . Smokeless tobacco: Never Used  . Alcohol use No     Allergies   Patient has no known allergies.   Review of Systems Review of Systems  Constitutional: Negative for activity change, appetite change and fever.  HENT: Negative for congestion and rhinorrhea.   Eyes: Negative for visual disturbance.  Respiratory: Positive for chest tightness and shortness of breath.   Cardiovascular: Positive for chest pain.  Gastrointestinal: Negative for abdominal pain, nausea and vomiting.  Genitourinary: Negative for urgency.  Musculoskeletal: Negative for arthralgias and myalgias.  Skin: Positive for wound.  Neurological: Negative for dizziness, weakness, light-headedness and headaches.   all other systems are negative except as noted in the HPI and PMH.     Physical Exam Updated Vital Signs BP (!) 173/85   Pulse 79   Resp 16   Ht 5\' 11"  (1.803 m)   Wt 97.5 kg (215 lb)   SpO2 93%   BMI 29.99 kg/m   Physical Exam  Constitutional: He is oriented to person, place, and time. He  appears well-developed and well-nourished. No distress.  HENT:  Head: Normocephalic and atraumatic.  Mouth/Throat: Oropharynx is clear and moist. No oropharyngeal exudate.  Eyes: Pupils are equal, round, and reactive to light. Conjunctivae and EOM are normal.  Neck: Normal range of motion. Neck supple.  No meningismus.  Cardiovascular: Normal rate, normal heart sounds and intact distal pulses.  No murmur heard. Irregular rhythm  Pulmonary/Chest: Effort normal and breath sounds normal. No respiratory distress.  Diminished at bases  Abdominal: Soft. There is no tenderness. There is no rebound and no guarding.  Musculoskeletal: Normal range of motion. He exhibits no edema or tenderness.  Surgical dressing to L foot  With dressing removed, wound in place to plantar surface with packing. Mild surrounding erythema  Neurological: He is alert and oriented to person, place, and time. No cranial nerve deficit. He exhibits normal muscle tone. Coordination normal.   5/5 strength throughout. CN 2-12 intact.Equal grip strength.   Skin: Skin is warm.  Psychiatric: He has a normal mood and affect. His behavior is normal.  Nursing note and vitals reviewed.    ED Treatments / Results  Labs (all labs ordered are listed, but only abnormal results are displayed) Labs Reviewed  CBC WITH DIFFERENTIAL/PLATELET - Abnormal; Notable for the following:       Result Value   WBC 10.8 (*)    RBC 3.13 (*)    Hemoglobin 8.6 (*)    HCT 26.6 (*)    Neutro Abs 7.9 (*)    Monocytes Absolute 1.2 (*)    All other components within normal limits  COMPREHENSIVE METABOLIC PANEL - Abnormal; Notable for the following:    Sodium 132 (*)    Potassium 3.1 (*)    Chloride 91 (*)    Creatinine, Ser 1.31 (*)    Total Protein 6.4 (*)    Albumin 2.8 (*)    ALT 13 (*)    GFR calc non Af Amer 49 (*)    GFR calc Af Amer 56 (*)    All other components within normal limits  PROTIME-INR - Abnormal; Notable for the  following:    Prothrombin Time 16.8 (*)    All other components within normal limits  D-DIMER, QUANTITATIVE (NOT AT [facility]) - Abnormal; Notable for the following:    D-Dimer, Quant 0.94 (*)    All other components within normal limits  BRAIN NATRIURETIC PEPTIDE - Abnormal; Notable for the following:    B Natriuretic Peptide 476.0 (*)    All other components within normal limits  TROPONIN I  BASIC METABOLIC PANEL  CBC  HEMOGLOBIN A1C  TROPONIN I  TROPONIN I  TROPONIN I    EKG  EKG Interpretation  Date/Time:  Thursday September 24 2016 00:06:28 EDT Ventricular Rate:  83 PR Interval:    QRS Duration: 90 QT Interval:  391 QTC Calculation: 460 R Axis:   102 Text Interpretation:  Atrial fibrillation Right axis deviation Borderline T abnormalities, diffuse leads No significant change was found Confirmed by Ezequiel Essex [phone]) on 09/24/2016 12:21:56 AM       Radiology Dg Chest 2 View  Result Date: 09/24/2016 CLINICAL DATA:  Shortness of breath and chest pain EXAM: CHEST  2 VIEW COMPARISON:  09/12/2016 FINDINGS: Post sternotomy changes. Small bilateral pleural effusions. Mild cardiomegaly with diffuse increased interstitial opacities suspicious for mild pulmonary edema. Right upper extremity catheter tip overlies the distal SVC. Atherosclerotic calcifications. No pneumothorax. IMPRESSION: Cardiomegaly with small bilateral pleural effusions. Diffuse increased interstitial opacity, suspicious for mild pulmonary edema. Electronically Signed   By: Donavan Foil M.D.   On: 09/24/2016 01:14    Procedures Procedures (including critical care time)  Medications Ordered in ED Medications - No data to display   Initial Impression / Assessment and Plan / ED Course  I have reviewed the triage vital signs and the nursing notes.  Pertinent labs & imaging results that were available during my care of the patient were reviewed by me and considered in my medical decision making (see chart for  details).    Episode of chest pain or shortness of breath while resting. EKG shows atrial fibrillation with nonspecific ST changes that is unchanged.  Patient is in no respiratory distress. Chest x-ray shows mild interstitial edema and small pleural effusions. Labs are reassuring with stable creatinine. D-dimer is elevated but patient is anticoagulated with Eliquis and chest x-ray appearance is more suggestive of CHF rather than PE.  Patient given IV Lasix. His chest pain has resolved. We'll plan admission for diuresis for diastolic heart failure exacerbation. Will also need rule out for chest pain given his risk factors. Discussed with Dr. Darrick Meigs.  Final Clinical Impressions(s) / ED Diagnoses   Final diagnoses:  Acute on chronic diastolic congestive heart failure (HCC)  Chest pain, unspecified type    New Prescriptions New Prescriptions   No medications on file     Ezequiel Essex, MD 09/24/16 [phone]

## 2016-09-24 NOTE — Plan of Care (Signed)
Problem: Skin Integrity: Goal: Risk for impaired skin integrity will decrease Outcome: Progressing Dressing changes ordered and seen by wound care nurse

## 2016-09-24 NOTE — Progress Notes (Signed)
Inpatient Diabetes Program Recommendations  AACE/ADA: New Consensus Statement on Inpatient Glycemic Control (2015)  Target Ranges:  Prepandial:   less than 140 mg/dL      Peak postprandial:   less than 180 mg/dL (1-2 hours)      Critically ill patients:  140 - 180 mg/dL   Results for [name], [name] (MRN [id number]) as of 09/24/2016 09:48  Ref. Range 09/24/2016 07:43 09/24/2016 09:20  Glucose-Capillary Latest Ref Range: 65 - 99 mg/dL 53 (L) 115 (H)   Results for [name], [name] (MRN [id number]) as of 09/24/2016 09:48  Ref. Range 09/24/2016 00:21 09/24/2016 04:04  Glucose Latest Ref Range: 65 - 99 mg/dL 72 54 (L)   Results for [name], [name] (MRN [id number]) as of 09/24/2016 09:48  Ref. Range 09/17/2016 07:17  Hemoglobin A1C Latest Ref Range: 4.8 - 5.6 % 9.0 (H)   Review of Glycemic Control  Diabetes history: DM2 Outpatient Diabetes medications: Lantus 30 units QHS Current orders for Inpatient glycemic control: Lantus 30 units QHS, Novolog 0-9 units TID with meals  Inpatient Diabetes Program Recommendations: Insulin - Basal: Noted glucose 53 mg/dl today. Please consider decreasing Lantus to 25 untis QHS. Correction (SSI): Please consider ordering Novolog 0-5 units QHS for bedtime correction scale. HgbA1C: A1C 9.0% on 09/17/16 indicating an average glucose of 212 mg/dl over the past 2-3 months.  Thanks, Barnie Alderman, RN, MSN, CDE Diabetes Coordinator Inpatient Diabetes Program [phone] (Team Pager from 8am to 5pm)

## 2016-09-24 NOTE — Progress Notes (Addendum)
Patient with acute on chronic diastolic heart failure BNP elevated at 476 responding to diuresis blood pressure seems to be elevated potassium is 3.2 will add potassium chloride 20 mEq by mouth daily monitor bmet daily. His troponins are negative 3. I do not feel this pain is ischemic in origin [name] [id number] DOB: 01-15-1931 DOA: 09/24/2016 PCP: [name], MD   Physical Exam: Blood pressure (!) 174/96, pulse 83, temperature 97.8 F (36.6 C), resp. rate 18, height 5\' 11"  (1.803 m), weight 97.5 kg (215 lb), SpO2 100 %. No JVD no carotid bruits no thyromegaly lungs show diminished breath sounds in bases no rales audible heart regular rhythm no S3 auscultated no S4 no heaves thrills or rubs   Investigations:  No results found for this or any previous visit (from the past 240 hour(s)).   Basic Metabolic Panel:  Recent Labs  09/24/16 0021 09/24/16 0404  NA 132* 135  K 3.1* 3.2*  CL 91* 93*  CO2 32 33*  GLUCOSE 72 54*  BUN 13 13  CREATININE 1.31* 1.32*  CALCIUM 9.9 10.0   Liver Function Tests:  Recent Labs  09/24/16 0021  AST 16  ALT 13*  ALKPHOS 113  BILITOT 0.5  PROT 6.4*  ALBUMIN 2.8*     CBC:  Recent Labs  09/22/16 0700 09/24/16 0021 09/24/16 0404  WBC 12.6* 10.8* 11.2*  NEUTROABS 9.8* 7.9*  --   HGB 8.7* 8.6* 8.0*  HCT 27.7* 26.6* 24.9*  MCV 85.8 85.0 84.7  PLT 323 249 252    Dg Chest 2 View  Result Date: 09/24/2016 CLINICAL DATA:  Shortness of breath and chest pain EXAM: CHEST  2 VIEW COMPARISON:  09/12/2016 FINDINGS: Post sternotomy changes. Small bilateral pleural effusions. Mild cardiomegaly with diffuse increased interstitial opacities suspicious for mild pulmonary edema. Right upper extremity catheter tip overlies the distal SVC. Atherosclerotic calcifications. No pneumothorax. IMPRESSION: Cardiomegaly with small bilateral pleural effusions. Diffuse increased interstitial opacity, suspicious for mild pulmonary edema. Electronically  Signed   By: Donavan Foil M.D.   On: 09/24/2016 01:14      Medications:  Impression:  Active Problems:   Essential hypertension   Atrial fibrillation with normal ventricular rate (HCC)   Type 2 diabetes mellitus (HCC)   CAD (coronary artery disease)   CHF exacerbation (HCC)     Plan:Increase antihypertensive regimen for better control. Will resume Norvasc 10 mg by mouth daily continue clonidine 0.1 mg by mouth twice a day. Monitor renal function and consider are better in a.m. depending on blood pressure response  Consultants:    Procedures   Antibiotics: Vancomycin          Time spent: 30 minutes   LOS: 0 days   Safiyah Cisney M   09/24/2016, 1:48 PM

## 2016-09-24 NOTE — Consult Note (Signed)
[facility] Nurse wound consult note Reason for Consult: Full thickness wound on plantar aspect of left foot.  Small stab wound on plantar aspect of RGT. Seen by this writer approximately 3 weeks ago, when wound was 26 days old.  Wound type:Traumatic.  Patient and friends state that he "stepped on a bolt" (rusty and broken) and that it went through his tennis shoe.  The RGT is from a previous injury, where the patient states he stepped on a nail. It is noted that the patient has diabetes, some neuropathy.  He has been on antibiotics for several weeks. Last admission, patient wqas seen by Dr. Arnoldo Morale.  See note dated 09/02/16. Pressure Injury POA: N/A Measurement:  Left foot, plantar aspect:  3cm x 1cm with 50% of wound bed obscured by the presence of necrotic tissue.  I am able to insert a cotton tipped applicator into the more distal 50% to a depth of 3.5cm. Wound bed: 50% yellow stringy slough covering the depth of 3.5 cm.  50% of wound with black necrotic eschar (soft and movable), but I cannot remove or penetrate this area. RGT:  0.2cm round puncture wound with dried serum (scab) obscuring wound bed. Drainage (amount, consistency, odor) Small amount serosanguinous, no odor Periwound: Feet are dry and peeling Dressing procedure/placement/frequency:I will begin a twice daily filling of this defect with 1/2 inch iodoform gauze packing strip after first cleansing with NS. This will be followed by the covering of the rest of the wound with a NS moistened gauze dressing and securing. Patient is likely to require radiographic studies to determine if osteomyelitis or soft tissue infection is present. He also may require wider debridement of the area given the necrotic tissue present. If you agree, please consult surgery. The nail injury to the RGT will be treated with a NS dampened dressing twice daily. Both feet are to be elevated and heels floated to prevent pressure injury. During his last admission I provided  pressure redistribution heel boots, but patient states that these are not needed again. [place] nursing team will not follow, but will remain available to this patient, the nursing and medical teams.  Please re-consult if needed. Thanks, Maudie Flakes, MSN, RN, [place], Arther Abbott  Pager# [phone]

## 2016-09-25 DIAGNOSIS — I5033 Acute on chronic diastolic (congestive) heart failure: Secondary | ICD-10-CM | POA: Diagnosis not present

## 2016-09-25 LAB — GLUCOSE, CAPILLARY
GLUCOSE-CAPILLARY: 193 mg/dL — AB (ref 65–99)
GLUCOSE-CAPILLARY: 171 mg/dL — AB (ref 65–99)
Glucose-Capillary: 140 mg/dL — ABNORMAL HIGH (ref 65–99)
Glucose-Capillary: 194 mg/dL — ABNORMAL HIGH (ref 65–99)

## 2016-09-25 LAB — BASIC METABOLIC PANEL
ANION GAP: 8 (ref 5–15)
BUN: 14 mg/dL (ref 6–20)
CHLORIDE: 94 mmol/L — AB (ref 101–111)
CO2: 35 mmol/L — AB (ref 22–32)
Calcium: 10 mg/dL (ref 8.9–10.3)
Creatinine, Ser: 1.44 mg/dL — ABNORMAL HIGH (ref 0.61–1.24)
GFR calc Af Amer: 50 mL/min — ABNORMAL LOW (ref >60)
GFR calc non Af Amer: 43 mL/min — ABNORMAL LOW (ref >60)
GLUCOSE: 150 mg/dL — AB (ref 65–99)
POTASSIUM: 3.9 mmol/L (ref 3.5–5.1)
Sodium: 137 mmol/L (ref 135–145)

## 2016-09-25 MED ORDER — HYDROCORTISONE 1 % EX CREA: TOPICAL_CREAM | Freq: Three times a day (TID) | CUTANEOUS | Status: DC | PRN

## 2016-09-25 NOTE — Progress Notes (Signed)
Patient eating at 1630  and at 1734 patient decline nebulizer treatment because he wanted to rest. Patient prefers to use mouthpiece yet, he often does not inhale deeply enough to use it properly nor does he fully complete the five minute breathing treatment.

## 2016-09-25 NOTE — Progress Notes (Signed)
Blood pressure much improved with the addition of Norvasc 10 and Avapro 75 creatinine 1.44 potassium 3.9 patient continues with DuoNeb nebulizer less dyspnea than yesterday when he had accelerated hypertension patient also has significant anxiety that he was given lorazepam 0.5 4 times a day [name] [id number] DOB: July 05, 1931 DOA: 09/24/2016 PCP: [name], MD   Physical Exam: Blood pressure (!) 111/56, pulse 76, temperature 98.2 F (36.8 C), temperature source Oral, resp. rate 20, height _0  (1.803 m), weight 90.1 kg (198 lb 10.2 oz), SpO2 100 %. Lungs diminished breath sounds in the bases prolonged x-ray phase no rales no wheezes audible heart regular rhythm no S3 or S4 no heaves thrills or rubs   Investigations:  No results found for this or any previous visit (from the past 240 hour(s)).   Basic Metabolic Panel:  Recent Labs  09/24/16 0404 09/25/16 0651  NA 135 137  K 3.2* 3.9  CL 93* 94*  CO2 33* 35*  GLUCOSE 54* 150*  BUN 13 14  CREATININE 1.32* 1.44*  CALCIUM 10.0 10.0   Liver Function Tests:  Recent Labs  09/24/16 0021  AST 16  ALT 13*  ALKPHOS 113  BILITOT 0.5  PROT 6.4*  ALBUMIN 2.8*     CBC:  Recent Labs  09/24/16 0021 09/24/16 0404  WBC 10.8* 11.2*  NEUTROABS 7.9*  --   HGB 8.6* 8.0*  HCT 26.6* 24.9*  MCV 85.0 84.7  PLT 249 252    Dg Chest 2 View  Result Date: 09/24/2016 CLINICAL DATA:  Shortness of breath and chest pain EXAM: CHEST  2 VIEW COMPARISON:  09/12/2016 FINDINGS: Post sternotomy changes. Small bilateral pleural effusions. Mild cardiomegaly with diffuse increased interstitial opacities suspicious for mild pulmonary edema. Right upper extremity catheter tip overlies the distal SVC. Atherosclerotic calcifications. No pneumothorax. IMPRESSION: Cardiomegaly with small bilateral pleural effusions. Diffuse increased interstitial opacity, suspicious for mild pulmonary edema. Electronically Signed   By: Donavan Foil M.D.    On: 09/24/2016 01:14      Medications:  Impression:  Active Problems:   Essential hypertension   Atrial fibrillation with normal ventricular rate (HCC)   Type 2 diabetes mellitus (HCC)   CAD (coronary artery disease)   CHF exacerbation (HCC)     Plan:Continue antihypertensive regimen monitor renal function be met and electrolytes daily continue DuoNeb nebulizers  Consultants:    Procedures   Antibiotics:           Time spent: 30 minutes   LOS: 0 days   Ruqaya Strauss M   09/25/2016, 1:09 PM

## 2016-09-25 NOTE — Progress Notes (Signed)
Notified MD of 3.6 second pause.  Patient in room resting comfortably with no c/o chest pain.  No new orders received at this time.  Will continue to monitor patient.

## 2016-09-25 NOTE — Evaluation (Signed)
Physical Therapy Evaluation Patient Details Name: [name] MRN: [id number] DOB: 09/01/1931 Today's Date: 09/25/2016   History of Present Illness   Trigger Frasier  is a 83 y.o. male, With history of left foot cellulitis and abscess status post incision and drainage, on IV antibiotics vancomycin and Zosyn completed on 76/28/3151, diastolic CHF, hypertension, hyperlipidemia, atrial fibrillation, on long-term anticoagulation with eliquis who was recently discharged to skilled nursing facility on IV antibiotics. The patient was found to be short of breath, also complained of chest pain. He was sent to ED for further evaluation.    Clinical Impression  Patient limited for functional mobility as stated below secondary to BLE weakness, fatigue and poor standing balance.  Patient will benefit from continued physical therapy in hospital and recommended venue below to increase strength, balance, endurance for safe ADLs and gait.    Follow Up Recommendations SNF;Supervision/Assistance - 24 hour    Equipment Recommendations  Wheelchair (measurements PT);Wheelchair cushion (measurements PT);Standard walker    Recommendations for Other Services       Precautions / Restrictions Precautions Precautions: Fall Precaution Comments: left plantar surface forefoot wound Restrictions Weight Bearing Restrictions: Yes LLE Weight Bearing: Partial weight bearing      Mobility  Bed Mobility Overal bed mobility: Needs Assistance Bed Mobility: Supine to Sit;Sit to Supine;Rolling Rolling: Min guard   Supine to sit: Min assist;Mod assist Sit to supine: Min assist;Mod assist   General bed mobility comments: required verbals to use BUE to help scoot forward  Transfers Overall transfer level: Needs assistance Equipment used: Rolling walker (2 wheeled) Transfers: Sit to/from [facility] Sit to Stand: Min assist Stand pivot transfers: Min assist           Ambulation/Gait Ambulation/Gait assistance: Min assist Ambulation Distance (Feet): 10 Feet Assistive device: Rolling walker (2 wheeled) Gait Pattern/deviations: Decreased stance time - right;Decreased step length - left;Decreased step length - right;Decreased stance time - left;Decreased stride length   Gait velocity interpretation: Below normal speed for age/gender General Gait Details: demonstrates labored cadence with fair return for keeping body weight off distal plantar surface of left foot  Stairs            Wheelchair Mobility    Modified Rankin (Stroke Patients Only)       Balance Overall balance assessment: Needs assistance Sitting-balance support: No upper extremity supported;Feet supported Sitting balance-Leahy Scale: Good     Standing balance support: Bilateral upper extremity supported;During functional activity Standing balance-Leahy Scale: Fair                               Pertinent Vitals/Pain Pain Assessment: No/denies pain    Home Living Family/patient expects to be discharged to:: Skilled nursing facility Living Arrangements: Alone Available Help at Discharge: Family Type of Home: Mobile home Home Access: Stairs to enter Entrance Stairs-Rails: Can reach both Entrance Stairs-Number of Steps: 3 Home Layout: One level Home Equipment: None      Prior Function Level of Independence: Independent               Hand Dominance   Dominant Hand: Right    Extremity/Trunk Assessment   Upper Extremity Assessment Upper Extremity Assessment: Generalized weakness    Lower Extremity Assessment Lower Extremity Assessment: Generalized weakness;LLE deficits/detail LLE Deficits / Details: 3+/5    Cervical / Trunk Assessment Cervical / Trunk Assessment: Normal  Communication   Communication: No difficulties  Cognition Arousal/Alertness: Awake/alert Behavior  During Therapy: WFL for tasks assessed/performed Overall Cognitive  Status: Within Functional Limits for tasks assessed                                        General Comments      Exercises     Assessment/Plan    PT Assessment Patient needs continued PT services  PT Problem List Decreased strength;Decreased activity tolerance;Decreased balance;Decreased mobility       PT Treatment Interventions Gait training;Stair training;Functional mobility training;Therapeutic activities;Therapeutic exercise;Patient/family education    PT Goals (Current goals can be found in the Care Plan section)  Acute Rehab PT Goals Patient Stated Goal: return home after rehab PT Goal Formulation: With patient Time For Goal Achievement: 09/29/16 Potential to Achieve Goals: Good    Frequency Min 3X/week   Barriers to discharge        Co-evaluation               AM-PAC PT "6 Clicks" Daily Activity  Outcome Measure Difficulty turning over in bed (including adjusting bedclothes, sheets and blankets)?: Unable Difficulty moving from lying on back to sitting on the side of the bed? : Unable Difficulty sitting down on and standing up from a chair with arms (e.g., wheelchair, bedside commode, etc,.)?: Unable Help needed moving to and from a bed to chair (including a wheelchair)?: A Little Help needed walking in hospital room?: A Little Help needed climbing 3-5 steps with a railing? : A Lot 6 Click Score: 11    End of Session Equipment Utilized During Treatment: Gait belt Activity Tolerance: Patient limited by fatigue Patient left: in chair;with call bell/phone within reach;with family/visitor present Nurse Communication: Mobility status PT Visit Diagnosis: Unsteadiness on feet (R26.81);Other abnormalities of gait and mobility (R26.89);Muscle weakness (generalized) (M62.81)    Time: 6384-5364 PT Time Calculation (min) (ACUTE ONLY): 32 min   Charges:   PT Evaluation $PT Eval Low Complexity: 1 Low PT Treatments $Therapeutic Activity: 23-37  mins   PT G Codes:   PT G-Codes **NOT FOR INPATIENT CLASS** Functional Assessment Tool Used: AM-PAC 6 Clicks Basic Mobility Functional Limitation: Mobility: Walking and moving around Mobility: Walking and Moving Around Current Status (W8032): At least 60 percent but less than 80 percent impaired, limited or restricted Mobility: Walking and Moving Around Goal Status [phone]): At least 60 percent but less than 80 percent impaired, limited or restricted Mobility: Walking and Moving Around Discharge Status [phone]): At least 60 percent but less than 80 percent impaired, limited or restricted    12:06 PM, 09/25/16 Lonell Grandchild, MPT Physical Therapist with [facility] [phone] office [phone] mobile phone

## 2016-09-25 NOTE — Care Management Obs Status (Signed)
[place] NOTIFICATION   Patient Details  Name: [name] MRN: [id number] Date of Birth: [birth year]   Medicare Observation Status Notification Given:  Yes    Sherald Barge, RN 09/25/2016, 9:51 AM

## 2016-09-26 ENCOUNTER — Inpatient Hospital Stay
Admission: RE | Admit: 2016-09-26 | Discharge: 2016-10-06 | Disposition: A | Discharge status: Other Acute Inpatient Hospital | Payer: Medicare Other | Source: Ambulatory Visit | Attending: Internal Medicine | Admitting: Internal Medicine

## 2016-09-26 DIAGNOSIS — M869 Osteomyelitis, unspecified: Principal | ICD-10-CM

## 2016-09-26 DIAGNOSIS — I5033 Acute on chronic diastolic (congestive) heart failure: Secondary | ICD-10-CM | POA: Diagnosis not present

## 2016-09-26 DIAGNOSIS — E11621 Type 2 diabetes mellitus with foot ulcer: Principal | ICD-10-CM

## 2016-09-26 DIAGNOSIS — L97509 Non-pressure chronic ulcer of other part of unspecified foot with unspecified severity: Principal | ICD-10-CM

## 2016-09-26 DIAGNOSIS — E1169 Type 2 diabetes mellitus with other specified complication: Principal | ICD-10-CM

## 2016-09-26 LAB — BASIC METABOLIC PANEL
ANION GAP: 7 (ref 5–15)
BUN: 18 mg/dL (ref 6–20)
CHLORIDE: 96 mmol/L — AB (ref 101–111)
CO2: 34 mmol/L — ABNORMAL HIGH (ref 22–32)
Calcium: 9.9 mg/dL (ref 8.9–10.3)
Creatinine, Ser: 1.57 mg/dL — ABNORMAL HIGH (ref 0.61–1.24)
GFR calc Af Amer: 45 mL/min — ABNORMAL LOW (ref >60)
GFR calc non Af Amer: 39 mL/min — ABNORMAL LOW (ref >60)
GLUCOSE: 123 mg/dL — AB (ref 65–99)
POTASSIUM: 3.8 mmol/L (ref 3.5–5.1)
Sodium: 137 mmol/L (ref 135–145)

## 2016-09-26 LAB — CBC
HCT: 26.1 % — ABNORMAL LOW (ref 39.0–52.0)
HEMOGLOBIN: 7.8 g/dL — AB (ref 13.0–17.0)
MCH: 26.7 pg (ref 26.0–34.0)
MCHC: 29.9 g/dL — ABNORMAL LOW (ref 30.0–36.0)
MCV: 89.4 fL (ref 78.0–100.0)
PLATELETS: 226 10*3/uL (ref 150–400)
RBC: 2.92 MIL/uL — AB (ref 4.22–5.81)
RDW: 14.2 % (ref 11.5–15.5)
WBC: 9.8 10*3/uL (ref 4.0–10.5)

## 2016-09-26 LAB — GLUCOSE, CAPILLARY
GLUCOSE-CAPILLARY: 107 mg/dL — AB (ref 65–99)
GLUCOSE-CAPILLARY: 116 mg/dL — AB (ref 65–99)
Glucose-Capillary: 123 mg/dL — ABNORMAL HIGH (ref 65–99)

## 2016-09-26 MED ORDER — POTASSIUM CHLORIDE CRYS ER 20 MEQ PO TBCR: 20.0000 meq | EXTENDED_RELEASE_TABLET | Freq: Every day | ORAL | 0 refills | Status: DC

## 2016-09-26 MED ORDER — AMLODIPINE BESYLATE 10 MG PO TABS: 10.0000 mg | ORAL_TABLET | Freq: Every day | ORAL | 5 refills | Status: DC

## 2016-09-26 MED ORDER — LORAZEPAM 0.5 MG PO TABS: 0.5000 mg | ORAL_TABLET | Freq: Four times a day (QID) | ORAL | 0 refills | Status: DC

## 2016-09-26 MED ORDER — IPRATROPIUM-ALBUTEROL 0.5-2.5 (3) MG/3ML IN SOLN: 3.0000 mL | Freq: Four times a day (QID) | RESPIRATORY_TRACT | 3 refills | Status: DC

## 2016-09-26 MED ORDER — IRBESARTAN 75 MG PO TABS: 75.0000 mg | ORAL_TABLET | Freq: Every day | ORAL | 5 refills | Status: DC

## 2016-09-26 MED ORDER — APIXABAN 2.5 MG PO TABS
2.5000 mg | ORAL_TABLET | Freq: Two times a day (BID) | ORAL | Status: DC
  Administered 2016-09-26: 2.5 mg via ORAL
  Filled 2016-09-26: qty 1

## 2016-09-26 MED ORDER — TAMSULOSIN HCL 0.4 MG PO CAPS: 0.4000 mg | ORAL_CAPSULE | Freq: Every day | ORAL | 4 refills | Status: DC

## 2016-09-26 NOTE — Discharge Summary (Signed)
Physician Discharge Summary  [name] DGL:[id number] DOB: 04-07-1931 DOA: 09/24/2016  PCP: [name], MD  Admit date: 09/24/2016 Discharge date: 09/26/2016   Recommendations for Outpatient Follow-up:  Acute on chronic renal failure by history hypertension hyperlipidemia normal systolic function and diastolic dysfunction COPD who was admitted due to dyspnea and found to have accelerated hypertension apparently at least in the emergency room his Norvasc and Avapro were not placed on board 1 C4 reinstituted to his diuresis his breathing returned to baseline and he spent the next 2 days comfortable with just his regular nebulizer therapy and antihypertensive therapy he had fair glycemic control and systolics are ranging in the 110-120 range the last 48 hours in hospital he was subsequently transferred back to skilled nursing facility to pursue vigorous physical rehabilitation program for ambulation and strengthening so that he may return home in several weeks time he had a cellulitis of left foot stepped on a nail which did which did require an IND on previous hospitalization 2 weeks ago he has some pain upon weightbearing in that. He is discharged on Norvasc 10 Avapro 75 clonidine 0.1 mg by mouth twice a day Lasix 20 mg by mouth daily Toprol-XL 75 mg daily to control ventricular response along course 5 mg by mouth twice a day for anticoagulation due to chronic atrial fibrillation. As well as Flomax 0.4 mg by mouth daily for BPH. Discharge Diagnoses:  Active Problems:   Essential hypertension   Atrial fibrillation with normal ventricular rate (HCC)   Type 2 diabetes mellitus (HCC)   CAD (coronary artery disease)   CHF exacerbation [facility])   Discharge Condition:   Filed Weights   09/24/16 0005 09/25/16 0511 09/26/16 0641  Weight: 97.5 kg (215 lb) 90.1 kg (198 lb 10.2 oz) 90.1 kg (198 lb 10.2 oz)    History of present illness:  Patient is a 83-year-old white male history of chronic  atrial fibrillation and anticoagulation COPD status post CABG 4 normal systolic function and diastolic dysfunction insulin dependent diabetes acute on chronic renal failure now resolved hypertension hyperlipidemia as well as COPD. Patient was admitted for dyspnea presumably related to accelerated hypertension is his Norvasc when he was transferred ER was.continued for some reason as well as his Avapro these were reassessed 2 to three-day return to baseline he likewise requires DuoNeb treatments 4 times a day when necessary 4 times a day he is transferred back to skilled nursing facility [facility] to resume his physical rehabilitation he had significant anxiety in hospital with without dyspnea and milligrams by mouth 4 times a day for his antianxiety regimen as well as written a heart prescription for oxycodone 15 mg 1 by mouth 3 times a day when necessary for foot pain  Hospital Course:  See narrative above  Procedures:     Consultations:    Discharge Instructions  Discharge Instructions    Discharge instructions    Complete by:  As directed    Discharge patient    Complete by:  As directed    Discharge disposition:  03-Skilled [place]   Discharge patient date:  09/26/2016     Allergies as of 09/26/2016   No Known Allergies     Medication List    STOP taking these medications   AZOR 5-40 MG tablet Generic drug:  amLODipine-olmesartan   Normal Saline Flush 0.9 % Soln     TAKE these medications   amLODipine 10 MG tablet Commonly known as:  NORVASC Take 1 tablet (10  mg total) by mouth daily.   antiseptic oral rinse Liqd 15 mLs by Mouth Rinse route 4 (four) times daily.   cloNIDine 0.1 MG tablet Commonly known as:  CATAPRES Take 0.1 mg by mouth 2 (two) times daily.   docusate sodium 100 MG capsule Commonly known as:  COLACE Take 100 mg by mouth 2 (two) times daily.   ELIQUIS 5 MG Tabs tablet Generic drug:  apixaban Take 5 mg by mouth 2 (two) times daily.   feeding  supplement (PRO-STAT SUGAR FREE 64) Liqd Take 30 mLs by mouth 2 (two) times daily between meals.   FLUoxetine 20 MG/5ML solution Commonly known as:  PROZAC Take 5 mLs (20 mg total) by mouth daily.   furosemide 20 MG tablet Commonly known as:  LASIX Take 60 mg by mouth daily.   GLUCERNA Liqd Take 237 mLs by mouth 2 (two) times daily between meals.   insulin glargine 100 UNIT/ML injection Commonly known as:  LANTUS Inject 0.3 mLs (30 Units total) into the skin at bedtime.   ipratropium-albuterol 0.5-2.5 (3) MG/3ML Soln Commonly known as:  DUONEB Take 3 mLs by nebulization 4 (four) times daily. What changed:  when to take this  reasons to take this   irbesartan 75 MG tablet Commonly known as:  AVAPRO Take 1 tablet (75 mg total) by mouth daily.   Iron 325 (65 Fe) MG Tabs Give 1 tablet by mouth once a day   LORazepam 0.5 MG tablet Commonly known as:  ATIVAN Take 1 tablet (0.5 mg total) by mouth 4 (four) times daily.   metoprolol succinate 25 MG 24 hr tablet Commonly known as:  TOPROL-XL Take 3 tablets (75 mg total) by mouth daily. Take with or immediately following a meal.   nitroGLYCERIN 0.4 MG SL tablet Commonly known as:  NITROSTAT Place 1 tablet (0.4 mg total) under the tongue every 5 (five) minutes as needed for chest pain.   oxyCODONE 15 MG immediate release tablet Commonly known as:  ROXICODONE Take 15 mg by mouth every 8 (eight) hours as needed for pain.   pantoprazole 40 MG tablet Commonly known as:  PROTONIX Take 1 tablet (40 mg total) by mouth daily.   potassium chloride SA 20 MEQ tablet Commonly known as:  K-DUR,KLOR-CON Take 1 tablet (20 mEq total) by mouth daily. What changed:  how much to take   rosuvastatin 10 MG tablet Commonly known as:  CRESTOR Take 10 mg by mouth daily.   tamsulosin 0.4 MG Caps capsule Commonly known as:  FLOMAX Take 1 capsule (0.4 mg total) by mouth daily.            Discharge Care Instructions        Start      Ordered   09/27/16 0000  amLODipine (NORVASC) 10 MG tablet  Daily     09/26/16 1038   09/27/16 0000  irbesartan (AVAPRO) 75 MG tablet  Daily     09/26/16 1038   09/27/16 0000  tamsulosin (FLOMAX) 0.4 MG CAPS capsule  Daily     09/26/16 1038   09/26/16 0000  potassium chloride SA (K-DUR,KLOR-CON) 20 MEQ tablet  Daily     09/26/16 1038   09/26/16 0000  ipratropium-albuterol (DUONEB) 0.5-2.5 (3) MG/3ML SOLN  4 times daily     09/26/16 1038   09/26/16 0000  LORazepam (ATIVAN) 0.5 MG tablet  4 times daily     09/26/16 1038   09/26/16 0000  Discharge instructions     09/26/16 1038  09/26/16 0000  Discharge patient    Question Answer Comment  Discharge disposition 03-Skilled Nursing Facility   Discharge patient date 09/26/2016      09/26/16 1038     No Known Allergies    The results of significant diagnostics from this hospitalization (including imaging, microbiology, ancillary and laboratory) are listed below for reference.    Significant Diagnostic Studies: Dg Chest 2 View  Result Date: 09/24/2016 CLINICAL DATA:  Shortness of breath and chest pain EXAM: CHEST  2 VIEW COMPARISON:  09/12/2016 FINDINGS: Post sternotomy changes. Small bilateral pleural effusions. Mild cardiomegaly with diffuse increased interstitial opacities suspicious for mild pulmonary edema. Right upper extremity catheter tip overlies the distal SVC. Atherosclerotic calcifications. No pneumothorax. IMPRESSION: Cardiomegaly with small bilateral pleural effusions. Diffuse increased interstitial opacity, suspicious for mild pulmonary edema. Electronically Signed   By: Donavan Foil M.D.   On: 09/24/2016 01:14   Dg Chest 2 View  Result Date: 09/09/2016 CLINICAL DATA:  Central chest pain on off for 4 weeks. History of CHF, diabetes, hypertension, coronary artery disease, gastroesophageal reflux disease, DVT, COPD, atrial fibrillation, chronic kidney disease, coronary artery bypass. EXAM: CHEST  2 VIEW COMPARISON:   09/01/2016 FINDINGS: Postoperative changes in the mediastinum. Cardiac enlargement without significant vascular congestion. Increasing interstitial pattern to the lung bases particularly on the left may indicate developing interstitial edema or pneumonia. Small left pleural effusion has developed since previous study. Atelectasis or infiltration in the left lung base is also developing. Calcification of the aorta. No pneumothorax. Right PICC line with tip over the low SVC region. Degenerative changes in the spine and shoulders. IMPRESSION: Cardiac enlargement. Developing consolidation or atelectasis in the left lung base with new small left pleural effusion and increasing bibasilar interstitial changes. Electronically Signed   By: Lucienne Capers M.D.   On: 09/09/2016 21:08   Dg Chest 2 View  Result Date: 09/01/2016 CLINICAL DATA:  Generalized abdominal and chest pain. EXAM: CHEST  2 VIEW COMPARISON:  08/27/2016. FINDINGS: Stable cardiomegaly with aortic atherosclerosis. Status post CABG. Mild interstitial edema with atelectasis at the lung bases. No pneumonic consolidation, effusion or pneumothorax. No acute osseous abnormality. IMPRESSION: 1. Stable cardiomegaly with aortic atherosclerosis and post CABG change. 2. Bibasilar atelectasis with minimal interstitial edema. Electronically Signed   By: Ashley Royalty M.D.   On: 09/01/2016 14:28   Dg Chest 2 View  Result Date: 08/27/2016 CLINICAL DATA:  Shortness of breath, hypertension, history of coronary artery disease and previous COPD. Remote history of smoking. EXAM: CHEST  2 VIEW COMPARISON:  PA and lateral chest x-ray of August 20, 2016 FINDINGS: Today's study is obtained in a lordotic fashion. The lungs are reasonably well inflated. The interstitial markings remain increased. The cardiac silhouette remains enlarged. The pulmonary vascularity is slightly less engorged. There is calcification in the wall of the aortic arch. The sternal wires are intact. A  small amount of pleural fluid is likely present on the right. IMPRESSION: CHF with mild interstitial edema stable to slightly improved since the previous study. Previous CABG.  Thoracic aortic atherosclerosis. Electronically Signed   By: David  Martinique M.D.   On: 08/27/2016 11:31   Ct Chest W Contrast  Result Date: 09/11/2016 CLINICAL DATA:  83 year old male with intermittent chest pain. Unresolved pneumonia. EXAM: CT CHEST WITH CONTRAST TECHNIQUE: Multidetector CT imaging of the chest was performed during intravenous contrast administration. CONTRAST:  65mL ISOVUE-300 IOPAMIDOL (ISOVUE-300) INJECTION 61% COMPARISON:  Chest radiographs 09/09/2016 and earlier. Chest CTs 06/10/2016 and 01/11/2015 FINDINGS: Cardiovascular:  Prior CABG. Stable cardiomegaly. No pericardial effusion. Calcified aortic atherosclerosis. Mediastinum/Nodes: Stable small mediastinal lymph nodes. No lymphadenopathy. Stable and negative for age thyroid. Lungs/Pleura: Major airways are patent, with no residual retained secretions which were visible in the trachea in June. Layering small to moderate bilateral pleural effusions are progressed on the left and new on the right since June. Associated left greater than right confluent lower lobe opacity has also progressed, and there are associated air bronchograms, but the affected lung appears to be enhancing, favoring atelectasis over consolidation. Mild additional superimposed bilateral dependent and compressive atelectasis. No superimposed pulmonary nodularity. Upper Abdomen: Stable visualized upper abdominal viscera with no significant abnormality. Musculoskeletal: Osteopenia. Prior sternotomy. No thoracic vertebral fracture or acute osseous finding identified. IMPRESSION: 1. New/increased bilateral layering pleural effusions since June, small to moderate and greater on the left. 2. Associated increased and confluent bilateral lower lobe opacity, greater on the left, with air bronchograms. Burtis Junes  this is atelectasis, although bilateral lower lobe pneumonia is difficult to exclude. 3. Otherwise stable chest including cardiomegaly, Aortic Atherosclerosis (ICD10-I70.0), prior CABG. Electronically Signed   By: Genevie Ann M.D.   On: 09/11/2016 10:10   US Venous Img Upper Uni Right  Result Date: 09/13/2016 CLINICAL DATA:  Right upper extremity pain with hand edema for 2 weeks. Right-sided PICC line in place. EXAM: RIGHT UPPER EXTREMITY VENOUS DOPPLER ULTRASOUND TECHNIQUE: Gray-scale sonography with graded compression, as well as color Doppler and duplex ultrasound were performed to evaluate the upper extremity deep venous system from the level of the subclavian vein and including the jugular, axillary, basilic, radial, ulnar and upper cephalic vein. Spectral Doppler was utilized to evaluate flow at rest and with distal augmentation maneuvers. COMPARISON:  None. FINDINGS: Contralateral Subclavian Vein: Respiratory phasicity is normal and symmetric with the symptomatic side. No evidence of thrombus. Normal compressibility. Internal Jugular Vein: No evidence of thrombus. Normal compressibility, respiratory phasicity and response to augmentation. Subclavian Vein: No evidence of thrombus. Normal compressibility, respiratory phasicity and response to augmentation. Axillary Vein: No evidence of thrombus. Normal compressibility, respiratory phasicity and response to augmentation. Cephalic Vein: No evidence of thrombus. Normal compressibility, respiratory phasicity and response to augmentation. Basilic Vein: No evidence of thrombus. Normal compressibility, respiratory phasicity and response to augmentation. Brachial Veins: No evidence of thrombus. Normal compressibility, respiratory phasicity and response to augmentation. Radial Veins: No evidence of thrombus. Normal compressibility, respiratory phasicity and response to augmentation. Ulnar Veins: No evidence of thrombus. Normal compressibility, respiratory phasicity and  response to augmentation. Venous Reflux:  None visualized. Other Findings: PICC line in place. No thrombus seen along the course of the PICC line. Ill-defined edema within the subcutaneous soft tissues of the right arm. IMPRESSION: No evidence of DVT within the right upper extremity. Ill-defined edema within the subcutaneous soft tissues. Electronically Signed   By: Franki Cabot M.D.   On: 09/13/2016 13:18   US Arterial Seg Single  Result Date: 09/02/2016 CLINICAL DATA:  83 year old male with left foot puncture wound and cellulitis. EXAM: NONINVASIVE PHYSIOLOGIC VASCULAR STUDY OF BILATERAL LOWER EXTREMITIES TECHNIQUE: Evaluation of both lower extremities was performed at rest, including calculation of ankle-brachial indices, multiple segmental pressure evaluation, segmental Doppler and segmental pulse volume recording. COMPARISON:  None. FINDINGS: Right ABI:  1.0 Left ABI:  1.2 Right Lower Extremity: Normal posterior tibial arterial waveform. The dorsalis pedis waveform is slightly abnormal suggesting an element of anterior tibial or small vessel disease. Left Lower Extremity: Normal posterior tibial waveform. The dorsalis pedis waveform is slightly  abnormal suggesting anterior tibial or small vessel disease. IMPRESSION: Normal bilateral resting ankle-brachial indices. Signed, Criselda Peaches, MD [facility] Electronically Signed   By: Jacqulynn Cadet M.D.   On: 09/02/2016 11:10   Dg Chest Port 1 View  Result Date: 09/12/2016 CLINICAL DATA:  PICC line placement. EXAM: PORTABLE CHEST 1 VIEW COMPARISON:  Chest x-ray dated 09/09/2016. FINDINGS: Right-sided PICC line in place. Tip appears stable in position at the level of the mid SVC. Heart size and mediastinal contours are stable. Opacity at the left lung base appears stable, likely atelectasis and/or small effusion. No new lung findings. IMPRESSION: PICC line appears adequately positioned at the  level of the mid SVC. Electronically Signed   By: Franki Cabot M.D.   On: 09/12/2016 13:04   Dg Foot Complete Left  Result Date: 09/01/2016 CLINICAL DATA:  Left foot redness. EXAM: LEFT FOOT - COMPLETE 3+ VIEW COMPARISON:  08/20/2016 FINDINGS: There is no evidence of fracture or dislocation. There is no periosteal reaction or bone destruction. There soft tissue emphysema between the third and fourth metatarsals concerning for infection. IMPRESSION: 1. No evidence of osteomyelitis of the left foot. 2. Soft tissue emphysema between the third and fourth metatarsals concerning for necrotizing infection. Electronically Signed   By: Kathreen Devoid   On: 09/01/2016 14:29    Microbiology: No results found for this or any previous visit (from the past 240 hour(s)).   Labs: Basic Metabolic Panel:  Recent Labs Lab 09/22/16 0700 09/24/16 0021 09/24/16 0404 09/25/16 0651 09/26/16 0611  NA 136 132* 135 137 137  K 3.2* 3.1* 3.2* 3.9 3.8  CL 94* 91* 93* 94* 96*  CO2 30 32 33* 35* 34*  GLUCOSE 162* 72 54* 150* 123*  BUN 16 13 13 14 18   CREATININE 1.41* 1.31* 1.32* 1.44* 1.57*  CALCIUM 10.5* 9.9 10.0 10.0 9.9   Liver Function Tests:  Recent Labs Lab 09/24/16 0021  AST 16  ALT 13*  ALKPHOS 113  BILITOT 0.5  PROT 6.4*  ALBUMIN 2.8*   No results for input(s): LIPASE, AMYLASE in the last 168 hours. No results for input(s): AMMONIA in the last 168 hours. CBC:  Recent Labs Lab 09/22/16 0700 09/24/16 0021 09/24/16 0404 09/26/16 0611  WBC 12.6* 10.8* 11.2* 9.8  NEUTROABS 9.8* 7.9*  --   --   HGB 8.7* 8.6* 8.0* 7.8*  HCT 27.7* 26.6* 24.9* 26.1*  MCV 85.8 85.0 84.7 89.4  PLT 323 249 252 226   Cardiac Enzymes:  Recent Labs Lab 09/24/16 0021 09/24/16 0404 09/24/16 0907 09/24/16 1620  TROPONINI <0.03 <0.03 <0.03 <0.03   BNP: BNP (last 3 results)  Recent Labs  07/24/16 1006 09/09/16 2115 09/24/16 0021  BNP 251.0* 404.0* 476.0*    ProBNP (last 3 results) No results for  input(s): PROBNP in the last 8760 hours.  CBG:  Recent Labs Lab 09/25/16 0744 09/25/16 1131 09/25/16 1614 09/25/16 2056 09/26/16 0744  GLUCAP 140* 194* 193* 171* 107*       Signed:  Donice Alperin M  [facility] Pager: [phone] 09/26/2016, 10:40 AM

## 2016-09-26 NOTE — Clinical Social Work Note (Signed)
Clinical Social Worker facilitated patient discharge including contacting patient family @ bedside and facility [facility]) to confirm patient discharge plans. Clinical information faxed to facility and family agreeable with plan. Nurse will transport pt to [facility]. RN to call report to [phone] prior to discharge.  Clinical Social Worker will sign off for now as social work intervention is no longer needed. Please consult [place] again if new need arises.  Traniyah Hallett B. Joline Maxcy Clinical Social Work Dept Weekend Social Worker [phone] 11:25 AM

## 2016-09-28 ENCOUNTER — Non-Acute Institutional Stay (SKILLED_NURSING_FACILITY): Payer: Medicare Other | Admitting: Internal Medicine

## 2016-09-28 ENCOUNTER — Encounter (HOSPITAL_COMMUNITY)
Admission: AD | Admit: 2016-09-28 | Discharge: 2016-09-28 | Disposition: A | Discharge status: Home / Self Care | Payer: Medicare Other | Source: Skilled Nursing Facility | Attending: Internal Medicine | Admitting: Internal Medicine

## 2016-09-28 ENCOUNTER — Encounter: Payer: Self-pay | Admitting: Internal Medicine

## 2016-09-28 DIAGNOSIS — N183 Chronic kidney disease, stage 3 unspecified: Secondary | ICD-10-CM

## 2016-09-28 DIAGNOSIS — E11628 Type 2 diabetes mellitus with other skin complications: Secondary | ICD-10-CM

## 2016-09-28 DIAGNOSIS — L089 Local infection of the skin and subcutaneous tissue, unspecified: Secondary | ICD-10-CM

## 2016-09-28 DIAGNOSIS — R1310 Dysphagia, unspecified: Secondary | ICD-10-CM | POA: Diagnosis not present

## 2016-09-28 DIAGNOSIS — I5033 Acute on chronic diastolic (congestive) heart failure: Secondary | ICD-10-CM | POA: Diagnosis not present

## 2016-09-28 DIAGNOSIS — E0800 Diabetes mellitus due to underlying condition with hyperosmolarity without nonketotic hyperglycemic-hyperosmolar coma (NKHHC): Secondary | ICD-10-CM | POA: Diagnosis not present

## 2016-09-28 DIAGNOSIS — I4891 Unspecified atrial fibrillation: Secondary | ICD-10-CM | POA: Diagnosis not present

## 2016-09-28 DIAGNOSIS — I5032 Chronic diastolic (congestive) heart failure: Secondary | ICD-10-CM | POA: Insufficient documentation

## 2016-09-28 LAB — CBC
HEMATOCRIT: 27.9 % — AB (ref 39.0–52.0)
HEMOGLOBIN: 8.6 g/dL — AB (ref 13.0–17.0)
MCH: 27.3 pg (ref 26.0–34.0)
MCHC: 30.8 g/dL (ref 30.0–36.0)
MCV: 88.6 fL (ref 78.0–100.0)
Platelets: 220 10*3/uL (ref 150–400)
RBC: 3.15 MIL/uL — AB (ref 4.22–5.81)
RDW: 14 % (ref 11.5–15.5)
WBC: 9.2 10*3/uL (ref 4.0–10.5)

## 2016-09-28 LAB — BASIC METABOLIC PANEL
ANION GAP: 9 (ref 5–15)
BUN: 18 mg/dL (ref 6–20)
CO2: 33 mmol/L — AB (ref 22–32)
Calcium: 9.9 mg/dL (ref 8.9–10.3)
Chloride: 92 mmol/L — ABNORMAL LOW (ref 101–111)
Creatinine, Ser: 1.61 mg/dL — ABNORMAL HIGH (ref 0.61–1.24)
GFR, EST AFRICAN AMERICAN: 44 mL/min — AB (ref >60)
GFR, EST NON AFRICAN AMERICAN: 38 mL/min — AB (ref >60)
GLUCOSE: 109 mg/dL — AB (ref 65–99)
POTASSIUM: 4 mmol/L (ref 3.5–5.1)
Sodium: 134 mmol/L — ABNORMAL LOW (ref 135–145)

## 2016-09-28 NOTE — Progress Notes (Addendum)
Provider:  Veleta Miners Location:   [place] Room Number: 129/P Place of Service:  SNF (31)  PCP: [name], MD Patient Care Team: [name], MD as PCP - General (Internal Medicine) [name], MD (Internal Medicine) [name], [name], MD as Consulting Physician (Gastroenterology)  Extended Emergency Contact Information Primary Emergency Contact: Thamas Jaegers, [place] [address] Johnnette Litter of [place] Phone: [phone] Mobile Phone: [phone] Relation: Daughter Secondary Emergency Contact: Eugene Gavia [place] of [place] Phone: [phone] Relation: Brother  Code Status: Full Code Goals of Care: Advanced Directive information Advanced Directives 09/28/2016  Does Patient Have a Medical Advance Directive? Yes  Type of Advance Directive (No Data)  Does patient want to make changes to medical advance directive? No - Patient declined  Copy of [place] in Chart? -  Would patient like information on creating a medical advance directive? No - Patient declined      Chief Complaint  Patient presents with  . Readmit To SNF    Readmission Visit    HPI: Patient is a 83 y.o. male seen today for Readmission to  SNF for therapy.  Patient has h/o COPD on ? PRN  oxygen at home, Hypertensin, Diabetes Mellitus Type 2, Chronic Kidney disease, Hyperlipidemia, CAD s/p CABG in 2011, BPH, Chronic Diastolic failure, with EF of 60%, Chronic Atrial Fibrillation on Chronic Anticoagulation.  Patient has had number of admissions in Past few weeks. He was initially admitted for IV antibiotics and wound care in 08/18/208. He had chest pain and was admitted in the hospital. He was diagnosed with HCAP and was discharged to the facility on IV Vanco and Zosyn While in the facility he developed SOB and Chest pain and was send again to the hospital. He was diagnosed with CHF and Diuresed with IV lasix. He  is off his antibiotics. He is back in the SNF for therapy and wound care. Patient has Diabetic foot infection which he sustained injury at home in 08/20/16. He has had IV antibiotics and Incision and Drainage.  He also has C/o difficulty in Swallowing and was seen by GI in last admission and had EGD with Dilatation. Patient Continues to C/o Dysphagia.Says food gets stuck in the throat.  He also continues to have SOB. And of and on chest pain. No fever or Chills. Cough with white sputum. He wants to know if he can go home with 24 hour care.  Past Medical History:  Diagnosis Date  . BPH (benign prostatic hyperplasia)   . CAD (coronary artery disease)    Multivessel status post CABG 2011 - LIMA to LAD, SVG to diagonal, SVG to OM, SVG to PDA  . Cellulitis    12/15  . Chronic back pain   . Chronic diastolic CHF (congestive heart failure) ([place])   . CKD (chronic kidney disease), stage III   . COPD (chronic obstructive pulmonary disease) ([place])   . Essential hypertension   . Gastric mass    EGD 9/15  . GERD (gastroesophageal reflux disease)   . History of DVT (deep vein thrombosis)    Postphlebitic syndrome  . History of kidney stones   . HOH (hard of hearing)   . Hx of CABG   . Hyperlipidemia   . Persistent atrial fibrillation ([place])    a. s/p DCCV 03/2016.  [name] Sleep apnea    Stop Bang score of 5  . Type 2 diabetes mellitus ([place])  Past Surgical History:  Procedure Laterality Date  . CARDIOVERSION N/A 03/20/2016   Procedure: CARDIOVERSION;  Surgeon: Arnoldo Lenis, MD;  [facility];  Service: Endoscopy;  Laterality: N/A;  . CATARACT EXTRACTION W/PHACO Left 02/07/2016   Procedure: CATARACT EXTRACTION PHACO AND INTRAOCULAR LENS PLACEMENT (IOC);  Surgeon: [name], MD;  Location: [facility];  Service: Ophthalmology;  Laterality: Left;  CDE:  23.13  . COLONOSCOPY  2004   Dr. Laural Golden: three small polyps at cecum, path unknown, external hemorrhoids  . COLONOSCOPY N/A  08/16/2013   Dr. [name]: incomplete prep. multiple tubular adenomas, multiple biopsies. Needs surveillance in Aug 2016 due to poor prep  . CORONARY ARTERY BYPASS GRAFT     x5  . CORONARY ARTERY BYPASS GRAFT  2011  . CYSTOSCOPY N/A 12/12/2012   Procedure: CYSTOSCOPY FLEXIBLE;  Surgeon: Marissa Nestle, MD;  Location: [facility];  Service: Urology;  Laterality: N/A;  . ESOPHAGEAL DILATION N/A 09/14/2016   Procedure: ESOPHAGEAL DILATION;  Surgeon: Daneil Dolin, MD;  [facility];  Service: Endoscopy;  Laterality: N/A;  . ESOPHAGOGASTRODUODENOSCOPY N/A 08/16/2013   Dr. [name]: normal esophagus s/p Maloney dilation, gastric erosions, submucosal gastric mass vs extrinsic mass  . ESOPHAGOGASTRODUODENOSCOPY (EGD) WITH PROPOFOL N/A 09/14/2016   Procedure: ESOPHAGOGASTRODUODENOSCOPY (EGD) WITH PROPOFOL;  Surgeon: Daneil Dolin, MD;  [facility];  Service: Endoscopy;  Laterality: N/A;  . EUS N/A 09/07/2013   Dr. 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 INCISION AND DRAINAGE ABSCESS N/A 12/20/2013   Procedure: INCISION AND DRAINAGE ABSCESS NECK;  Surgeon: Jamesetta So, MD;  Location: [facility];  Service: General;  Laterality: N/A;  . INCISION AND DRAINAGE ABSCESS Left 09/04/2016   Procedure: INCISION AND DRAINAGE ABSCESS;  Surgeon: Aviva Signs, MD;  Location: [facility];  Service: General;  Laterality: Left;  [name] MALONEY DILATION N/A 08/16/2013   Procedure: Venia Minks DILATION;  Surgeon: Daneil Dolin, MD;  [facility];  Service: Endoscopy;  Laterality: N/A;    reports that he quit smoking about 60 years ago. He has a 11.00 pack-year smoking history. He has never used smokeless tobacco. He reports that he does not drink alcohol or use drugs. Social History   Social History  . Marital status: Single    Spouse name: N/A  . Number of children: N/A  . Years of education: N/A   Occupational History  . Retired    Social History Main Topics  . Smoking status:  Former Smoker    Packs/day: 1.00    Years: 11.00    Quit date: 01/05/1956  . Smokeless tobacco: Never Used  . Alcohol use No  . Drug use: No  . Sexual activity: No   Other Topics Concern  . Not on file   Social History Narrative   ** Merged History Encounter **        Functional Status Survey:    Family History  Problem Relation Age of Onset  . Colon cancer Son 55       deceased    Health Maintenance  Topic Date Due  . FOOT EXAM  10/16/2016 (Originally 08/07/1941)  . OPHTHALMOLOGY EXAM  10/16/2016 (Originally 08/07/1941)  . PNA vac Low Risk Adult (1 of 2 - PCV13) 12/05/2016 (Originally 08/07/1998)  . HEMOGLOBIN A1C  03/24/2017  . TETANUS/TDAP  08/21/2026  . INFLUENZA VACCINE  Completed    No Known Allergies  Allergies as of 09/28/2016   No Known  Allergies     Medication List    Notice   This visit is during an admission. Changes to the med list made in this visit will be reflected in the After Visit Summary of the admission.     Review of Systems  Constitutional: Negative.   HENT: Negative.   Respiratory: Positive for cough and shortness of breath. Negative for apnea and choking.   Cardiovascular: Positive for chest pain and leg swelling.  Gastrointestinal: Negative.   Genitourinary: Positive for dysuria. Negative for urgency.  Musculoskeletal: Negative.   Skin: Positive for wound.  Neurological: Negative.   Psychiatric/Behavioral: Positive for sleep disturbance. The patient is nervous/anxious.     Vitals:   09/28/16 1537  BP: 128/73  Pulse: (!) 55  Resp: 18  Temp: 98.7 F (37.1 C)  SpO2: 95%   There is no height or weight on file to calculate BMI. Physical Exam  Constitutional: He appears well-developed and well-nourished.  HENT:  Head: Normocephalic.  Mouth/Throat: Oropharynx is clear and moist.  Eyes: Pupils are equal, round, and reactive to light.  Neck: Neck supple.  Cardiovascular: Normal rate and normal heart sounds.  An irregular rhythm  present.  No murmur heard. Pulmonary/Chest: Effort normal.  Decrease breadth Sounds Bilateral  Abdominal: Soft. Bowel sounds are normal. He exhibits no distension. There is no tenderness. There is no rebound.  Musculoskeletal: He exhibits edema.       Arms: Moderate edema Bilateral  Neurological: He is alert.  No focal Deficits  Skin: Skin is warm and dry.  Psychiatric: He has a normal mood and affect. His behavior is normal.    Labs reviewed: Basic Metabolic Panel:  Recent Labs  03/21/16 0556  07/25/16 0528  07/27/16 0815  08/25/16 0553 08/26/16 0526  09/25/16 0651 09/26/16 0611 09/28/16 0330  NA 138  < > 145  < > 140  < > 136 135  < > 137 137 134*  K 3.8  < > 4.0  < > 4.2  < > 4.1 4.5  < > 3.9 3.8 4.0  CL 99*  < > 105  < > 101  < > 104 105  < > 94* 96* 92*  CO2 30  < > 31  < > 30  < > 23 22  < > 35* 34* 33*  GLUCOSE 232*  < > 143*  < > 161*  < > 74 160*  < > 150* 123* 109*  BUN 32*  < > 25*  < > 42*  < > 70* 70*  < > 14 18 18   CREATININE 1.79*  < > 1.57*  < > 2.19*  < > 2.42* 2.10*  < > 1.44* 1.57* 1.61*  CALCIUM 9.6  < > 9.5  < > 9.1  < > 9.2 9.1  < > 10.0 9.9 9.9  MG 1.9  --  2.0  --  2.1  --   --   --   --   --   --   --   PHOS  --   --   --   --   --   --  5.3* 5.2*  --   --   --   --   < > = values in this interval not displayed. Liver Function Tests:  Recent Labs  09/01/16 1100 09/10/16 0648 09/24/16 0021  AST 19 16 16   ALT 35 22 13*  ALKPHOS 201* 142* 113  BILITOT 1.1 0.5 0.5  PROT 7.4 5.5* 6.4*  ALBUMIN 3.1* 2.2* 2.8*    Recent Labs  09/01/16 1100  LIPASE 26   No results for input(s): AMMONIA in the last 8760 hours. CBC:  Recent Labs  09/17/16 0717 09/22/16 0700 09/24/16 0021 09/24/16 0404 09/26/16 0611 09/28/16 0330  WBC 12.9* 12.6* 10.8* 11.2* 9.8 9.2  NEUTROABS 9.8* 9.8* 7.9*  --   --   --   HGB 9.1* 8.7* 8.6* 8.0* 7.8* 8.6*  HCT 28.7* 27.7* 26.6* 24.9* 26.1* 27.9*  MCV 85.7 85.8 85.0 84.7 89.4 88.6  PLT 369 323 249 252 226 220    Cardiac Enzymes:  Recent Labs  09/24/16 0404 09/24/16 0907 09/24/16 1620  TROPONINI <0.03 <0.03 <0.03   BNP: Invalid input(s): POCBNP Lab Results  Component Value Date   HGBA1C 8.5 (H) 09/24/2016   Lab Results  Component Value Date   TSH 1.721 09/09/2016   Lab Results  Component Value Date   VITAMINB12 829 09/14/2016   Lab Results  Component Value Date   FOLATE 16.0 09/14/2016   Lab Results  Component Value Date   IRON 21 (L) 09/14/2016   TIBC 241 (L) 09/14/2016   FERRITIN 190 09/14/2016    Imaging and Procedures obtained prior to SNF admission: No results found.  Assessment/Plan Acute on chronic diastolic congestive heart failure  Patient lost almost 10 lbs in the hospital and is down to 200 lbs now. He is doing well. Tough continue with Cough and SOB. Will continue to monitor. On Lasix 60 mg. Repeat Bmp in 1 week.  Atrial fibrillation with normal ventricular rate  Stable on Eliquis and Lopressor  Dysphagia D/W Speech also who are working with the Patient. He did have EGD and Dilatation done by [facility]. His Appetite is not good and he says he has difficulty swallowing. He does not want to see  Gertie Fey but if does not improve will consider re consult.  Diabetes mellitus  On Lantus BS stable in the facility   Diabetic foot infection  D/W Wound care It has not change. Continue Packing with follow up with Wound care clinic. Hypertension Well controlled on Norvasc and Irbesartan  CKD (chronic kidney disease), stage III Creat Near Baseline. Anemia His Hgb has been drifting down in past few weeks. Will check Stool for Occult. Will start him on iron but it is more likely combination of chronic disease and iron deficiency. Disposition Patient wants to go home with his caregiver. Pain control  Patient has been on high doses of Oxycodone as outpatient Family/ staff Communication:   Labs/tests ordered: BMP, CBC, Occult Blood, Check UA as c/o  Dysuria  Total time spent in this patient care encounter was 45_ minutes; greater than 50% of the visit spent counseling patient, reviewing records , Labs and coordinating care for problems addressed at this encounter.

## 2016-09-29 ENCOUNTER — Non-Acute Institutional Stay (SKILLED_NURSING_FACILITY): Payer: Medicare Other | Admitting: Internal Medicine

## 2016-09-29 ENCOUNTER — Encounter (HOSPITAL_COMMUNITY)
Admission: RE | Admit: 2016-09-29 | Discharge: 2016-09-29 | Disposition: A | Discharge status: Home / Self Care | Payer: Medicare Other | Source: Skilled Nursing Facility | Attending: *Deleted | Admitting: *Deleted

## 2016-09-29 ENCOUNTER — Encounter: Payer: Self-pay | Admitting: Internal Medicine

## 2016-09-29 DIAGNOSIS — E11628 Type 2 diabetes mellitus with other skin complications: Secondary | ICD-10-CM | POA: Diagnosis not present

## 2016-09-29 DIAGNOSIS — I5032 Chronic diastolic (congestive) heart failure: Secondary | ICD-10-CM | POA: Diagnosis not present

## 2016-09-29 DIAGNOSIS — L089 Local infection of the skin and subcutaneous tissue, unspecified: Secondary | ICD-10-CM

## 2016-09-29 DIAGNOSIS — R1310 Dysphagia, unspecified: Secondary | ICD-10-CM

## 2016-09-29 LAB — URINALYSIS, COMPLETE (UACMP) WITH MICROSCOPIC
BILIRUBIN URINE: NEGATIVE
Glucose, UA: 50 mg/dL — AB
Hgb urine dipstick: NEGATIVE
KETONES UR: NEGATIVE mg/dL
Leukocytes, UA: NEGATIVE
Nitrite: NEGATIVE
PROTEIN: 100 mg/dL — AB
Specific Gravity, Urine: 1.011 (ref 1.005–1.030)
pH: 5 (ref 5.0–8.0)

## 2016-09-29 LAB — OCCULT BLOOD X 1 CARD TO LAB, STOOL: FECAL OCCULT BLD: NEGATIVE

## 2016-09-29 NOTE — Progress Notes (Signed)
Location:    [facility] Room Number: 129/P Place of Service:  SNF (31) Provider:  Algis Downs, MD  Patient Care Team: [name], MD as PCP - General (Internal Medicine) [name], MD (Internal Medicine) [name] [name], MD as Consulting Physician (Gastroenterology)  Extended Emergency Contact Information Primary Emergency Contact: Thamas Jaegers, [place] [address] Johnnette Litter of [place] Phone: [phone] Mobile Phone: [phone] Relation: Daughter Secondary Emergency Contact: Eugene Gavia [place] of [place] Phone: [phone] Relation: Brother  Code Status:  Full Code Goals of care: Advanced Directive information Advanced Directives 09/29/2016  Does Patient Have a Medical Advance Directive? Yes  Type of Advance Directive (No Data)  Does patient want to make changes to medical advance directive? No - Patient declined  Copy of [place] in Chart? -  Would patient like information on creating a medical advance directive? No - Patient declined     Chief complaint-acute visit follow-up left foot wound-also weight gain  HPI:  Pt is a 83 y.o. male seen today for an acute visit for follow-up left foot wound with a history of infection-as well as recent weight gain.  Patient has a complicated medical history including COPD-hypertension diabetes type 2 chronic kidney disease hyperlipidemia coronary artery disease chronic diastolic CHF on Lasix-atrial fibrillation and chronic anticoagulation  Patient has had frequent hospital admissions lately initially admitted for IV antibiotics and wound care secondary to the left foot wound.  Socially he was admitted for chest pain and diagnosed with healthcare associated pneumonia and was discharged back to the facility on IV vancomycin and Zosyn.  While the facility he developed shortness of breath and chest pain in [place] again to  the hospital and diagnosed with CHF and diuresis with IV Lasix.  He is currently off antibiotics-he is here for therapy and wound care.  He has a diabetic foot infection that was sustained at home on 08/20/2016 he's had previous incision and drainage and noted above did have IV antibiotics.  Also has difficulty swallowing was seen by GI last admission and had an EGD with dilation.  Continues to complain of dysphagia and a swallow study apparently has been ordered he largely is eating soup at this time-suspect this has led possibly to some increased weight gain she's had recently weight currently is 22 pounds previous weight was 199 although he has been as high as it appears to not 13 pounds earlier this month.  Is not complaining of any increased shortness of breath beyond baseline he is on chronic oxygen with a history of COPD.  Regards to diuretics he is on-Lasix 60 mg every morning.  He also is on a beta blocker   He did have labs done yesterday which showed hemoglobin relatively stable at 8.6 she has been started on iron-renal function shows creatinine 1.61 BUN of 18 which is relatively baseline with previous levels previous creatinine in the 1.4-1.5 range.  In regards to the left foot wound nursing has assessed this feels he is having some increased erythema on the top and as well as bottom of his foot he does have small open areas and per review with the wound care nurse there appears to be some increased erythema here.  He does have follow-up scheduled with wound care physician Dr. Dellia Nims next week  He did have an MRI of the left foot 08/20/2016--which was negative for abscess osteomyelitis or septic joint-an  x-ray at the same time showed mild diffuse soft tissue swelling peripheral vascular disease no acute or focal bony abnormality  Currently he is afebrile vital signs appear to be stable he does not complain of increased shortness of breath beyond baseline is not complaining of  chest pain     Past Medical History:  Diagnosis Date  . BPH (benign prostatic hyperplasia)   . CAD (coronary artery disease)    Multivessel status post CABG 2011 - LIMA to LAD, SVG to diagonal, SVG to OM, SVG to PDA  . Cellulitis    12/15  . Chronic back pain   . Chronic diastolic CHF (congestive heart failure) ([place])   . CKD (chronic kidney disease), stage III   . COPD (chronic obstructive pulmonary disease) ([place])   . Essential hypertension   . Gastric mass    EGD 9/15  . GERD (gastroesophageal reflux disease)   . History of DVT (deep vein thrombosis)    Postphlebitic syndrome  . History of kidney stones   . HOH (hard of hearing)   . Hx of CABG   . Hyperlipidemia   . Persistent atrial fibrillation ([place])    a. s/p DCCV 03/2016.  [name] Sleep apnea    Stop Bang score of 5  . Type 2 diabetes mellitus ([place])    Past Surgical History:  Procedure Laterality Date  . CARDIOVERSION N/A 03/20/2016   Procedure: CARDIOVERSION;  Surgeon: Arnoldo Lenis, MD;  [facility];  Service: Endoscopy;  Laterality: N/A;  . CATARACT EXTRACTION W/PHACO Left 02/07/2016   Procedure: CATARACT EXTRACTION PHACO AND INTRAOCULAR LENS PLACEMENT (IOC);  Surgeon: [name], MD;  Location: [facility];  Service: Ophthalmology;  Laterality: Left;  CDE:  23.13  . COLONOSCOPY  2004   Dr. Laural Golden: three small polyps at cecum, path unknown, external hemorrhoids  . COLONOSCOPY N/A 08/16/2013   Dr. [name]: incomplete prep. multiple tubular adenomas, multiple biopsies. Needs surveillance in Aug 2016 due to poor prep  . CORONARY ARTERY BYPASS GRAFT     x5  . CORONARY ARTERY BYPASS GRAFT  2011  . CYSTOSCOPY N/A 12/12/2012   Procedure: CYSTOSCOPY FLEXIBLE;  Surgeon: Marissa Nestle, MD;  Location: [facility];  Service: Urology;  Laterality: N/A;  . ESOPHAGEAL DILATION N/A 09/14/2016   Procedure: ESOPHAGEAL DILATION;  Surgeon: Daneil Dolin, MD;  [facility];  Service: Endoscopy;  Laterality: N/A;  .  ESOPHAGOGASTRODUODENOSCOPY N/A 08/16/2013   Dr. [name]: normal esophagus s/p Maloney dilation, gastric erosions, submucosal gastric mass vs extrinsic mass  . ESOPHAGOGASTRODUODENOSCOPY (EGD) WITH PROPOFOL N/A 09/14/2016   Procedure: ESOPHAGOGASTRODUODENOSCOPY (EGD) WITH PROPOFOL;  Surgeon: Daneil Dolin, MD;  [facility];  Service: Endoscopy;  Laterality: N/A;  . EUS N/A 09/07/2013   Dr. 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 INCISION AND DRAINAGE ABSCESS N/A 12/20/2013   Procedure: INCISION AND DRAINAGE ABSCESS NECK;  Surgeon: Jamesetta So, MD;  Location: [facility];  Service: General;  Laterality: N/A;  . INCISION AND DRAINAGE ABSCESS Left 09/04/2016   Procedure: INCISION AND DRAINAGE ABSCESS;  Surgeon: Aviva Signs, MD;  Location: [facility];  Service: General;  Laterality: Left;  [name] MALONEY DILATION N/A 08/16/2013   Procedure: Venia Minks DILATION;  Surgeon: Daneil Dolin, MD;  [facility];  Service: Endoscopy;  Laterality: N/A;    No Known Allergies  Outpatient Encounter Prescriptions as of 09/29/2016  Medication Sig  . Amino Acids-Protein Hydrolys (FEEDING SUPPLEMENT, PRO-STAT  SUGAR FREE 64,) LIQD Take 30 mLs by mouth 2 (two) times daily between meals.  [name] amLODipine (NORVASC) 10 MG tablet Take 1 tablet (10 mg total) by mouth daily.  [name] antiseptic oral rinse (BIOTENE) LIQD 15 mLs by Mouth Rinse route 4 (four) times daily.  [name] apixaban (ELIQUIS) 5 MG TABS tablet Take 5 mg by mouth 2 (two) times daily.  . cloNIDine (CATAPRES) 0.1 MG tablet Take 0.1 mg by mouth 2 (two) times daily.  [name] docusate sodium (COLACE) 100 MG capsule Take 100 mg by mouth 2 (two) times daily.  . Ferrous Sulfate (IRON) 325 (65 Fe) MG TABS Give 1 tablet by mouth once a day  . FLUoxetine (PROZAC) 20 MG/5ML solution Take 5 mLs (20 mg total) by mouth daily.  . furosemide (LASIX) 20 MG tablet Take 60 mg by mouth daily.   [name] GLUCERNA (GLUCERNA) LIQD Take 237 mLs by mouth 2 (two) times daily  between meals.  . insulin glargine (LANTUS) 100 UNIT/ML injection Inject 0.3 mLs (30 Units total) into the skin at bedtime.  [name] ipratropium-albuterol (DUONEB) 0.5-2.5 (3) MG/3ML SOLN Take 3 mLs by nebulization every 6 (six) hours as needed.  . irbesartan (AVAPRO) 75 MG tablet Take 1 tablet (75 mg total) by mouth daily.  [name] LORazepam (ATIVAN) 0.5 MG tablet Take 1 tablet (0.5 mg total) by mouth 4 (four) times daily.  . metoprolol succinate (TOPROL-XL) 25 MG 24 hr tablet Take 3 tablets (75 mg total) by mouth daily. Take with or immediately following a meal.  . nitroGLYCERIN (NITROSTAT) 0.4 MG SL tablet Place 1 tablet (0.4 mg total) under the tongue every 5 (five) minutes as needed for chest pain.  [name] oxyCODONE (ROXICODONE) 15 MG immediate release tablet Take 15 mg by mouth every 8 (eight) hours as needed for pain.   . pantoprazole (PROTONIX) 40 MG tablet Take 1 tablet (40 mg total) by mouth daily.  . potassium chloride SA (K-DUR,KLOR-CON) 20 MEQ tablet Take 1 tablet (20 mEq total) by mouth daily.  . rosuvastatin (CRESTOR) 10 MG tablet Take 10 mg by mouth daily.  . tamsulosin (FLOMAX) 0.4 MG CAPS capsule Take 1 capsule (0.4 mg total) by mouth daily.   No facility-administered encounter medications on file as of 09/29/2016.     Review of Systems   In general is not complaining of fever or chills says he does feel somewhat weak but really wants to go home.  Skin does not complain rashes or itching has the wound of his left foot as noted above.  Head ears eyes nose mouth and throat does not complain of visual changes or sore throat does have apparently some dysphagia as noted above and swallow studies pending.  Respiratory has somewhat of a chronic cough and shortness of breath these have not worsened beyond baseline per patient.  Cardiac does not complain of chest pain currently does have a history of this however in the past does have lower extremity edema.  GI is not complaining of  nausea Diarrhea constipation does have certain dysphagia as noted above.  Does not complain of abdominal pain.  GU is not complaining of burning with urination day.  Musculoskeletal is not complaining of joint pain but does have lower extremity weakness.  Neurologic does not complain of dizziness headache or syncope.  Psych does had times have periodic anxiety-does have when necessary Ativan for this-again he really wants to go home   Immunization History  Administered Date(s) Administered  . Influenza, High Dose Seasonal PF  09/25/2016  . Influenza,inj,Quad PF,6+ Mos 12/22/2013  . Tdap 08/20/2016   Pertinent  Health Maintenance Due  Topic Date Due  . FOOT EXAM  10/16/2016 (Originally 08/07/1941)  . OPHTHALMOLOGY EXAM  10/16/2016 (Originally 08/07/1941)  . URINE MICROALBUMIN  10/16/2016 (Originally 08/07/1941)  . PNA vac Low Risk Adult (1 of 2 - PCV13) 12/05/2016 (Originally 08/07/1998)  . HEMOGLOBIN A1C  03/24/2017  . INFLUENZA VACCINE  Completed   No flowsheet data found. Functional Status Survey:    Vitals:   09/29/16 1422  BP: (!) 97/53  Pulse: (!) 57  Resp: 20  Temp: 98.2 F (36.8 C)  TempSrc: Oral  SpO2: 98%  Note--on recheck manual blood pressure reading was 136/66  Weight is 202 pounds Physical Exam  in general this is a well-developed elderly male in no distress sitting comfortably in his wheelchair he is visiting with his friend.  His skin is warm and dry the left foot is wrapped this has been assessed by the wound care nurse and I have reviewed photographs-he has some increased erythema on the top of his foot and also on the distal bottom of his foot the erythema surrounds small open areas per wound care nurse who follows him closely the erythema appears to have increased some from previous baseline.  Eyes pupils appear reactive light visual acuity appears grossly intact.  Oropharynx clear mucous membranes moist.  Chest is clear to auscultation with shallow air  entry there is no labored breathing.  Heart is regular irregular rate and rhythm without murmur gallop or rub he has always say one plus lower extremity edema bilaterally again difficul to fully assess t on the left secondary to wrapping the foot  Abdomen is obese soft nontender with positive bowel sounds.  Musculoskeletal--continues to have lower extremity weakness upper extremity strength appears preserved-he does have a healing hematoma on the dorsal aspect of his right hand.  Neurologic cannot really appreciate lateralizing findings he does have lower extremity weakness. Nerves appear grossly intact speech is clear.  Psych he appears alert and oriented pleasant and appropriate main concern is he wants to go home Labs reviewed:  Recent Labs  03/21/16 0556  07/25/16 0528  07/27/16 0815  08/25/16 0553 08/26/16 0526  09/25/16 0651 09/26/16 0611 09/28/16 0330  NA 138  < > 145  < > 140  < > 136 135  < > 137 137 134*  K 3.8  < > 4.0  < > 4.2  < > 4.1 4.5  < > 3.9 3.8 4.0  CL 99*  < > 105  < > 101  < > 104 105  < > 94* 96* 92*  CO2 30  < > 31  < > 30  < > 23 22  < > 35* 34* 33*  GLUCOSE 232*  < > 143*  < > 161*  < > 74 160*  < > 150* 123* 109*  BUN 32*  < > 25*  < > 42*  < > 70* 70*  < > 14 18 18   CREATININE 1.79*  < > 1.57*  < > 2.19*  < > 2.42* 2.10*  < > 1.44* 1.57* 1.61*  CALCIUM 9.6  < > 9.5  < > 9.1  < > 9.2 9.1  < > 10.0 9.9 9.9  MG 1.9  --  2.0  --  2.1  --   --   --   --   --   --   --   PHOS  --   --   --   --   --   --  5.3* 5.2*  --   --   --   --   < > = values in this interval not displayed.  Recent Labs  09/01/16 1100 09/10/16 0648 09/24/16 0021  AST 19 16 16   ALT 35 22 13*  ALKPHOS 201* 142* 113  BILITOT 1.1 0.5 0.5  PROT 7.4 5.5* 6.4*  ALBUMIN 3.1* 2.2* 2.8*    Recent Labs  09/17/16 0717 09/22/16 0700 09/24/16 0021 09/24/16 0404 09/26/16 0611 09/28/16 0330  WBC 12.9* 12.6* 10.8* 11.2* 9.8 9.2  NEUTROABS 9.8* 9.8* 7.9*  --   --   --   HGB 9.1*  8.7* 8.6* 8.0* 7.8* 8.6*  HCT 28.7* 27.7* 26.6* 24.9* 26.1* 27.9*  MCV 85.7 85.8 85.0 84.7 89.4 88.6  PLT 369 323 249 252 226 220   Lab Results  Component Value Date   TSH 1.721 09/09/2016   Lab Results  Component Value Date   HGBA1C 8.5 (H) 09/24/2016   Lab Results  Component Value Date   CHOL 114 09/17/2016   HDL 23 (L) 09/17/2016   LDLCALC 65 09/17/2016   TRIG 130 09/17/2016   CHOLHDL 5.0 09/17/2016    Significant Diagnostic Results in last 30 days:  Dg Chest 2 View  Result Date: 09/24/2016 CLINICAL DATA:  Shortness of breath and chest pain EXAM: CHEST  2 VIEW COMPARISON:  09/12/2016 FINDINGS: Post sternotomy changes. Small bilateral pleural effusions. Mild cardiomegaly with diffuse increased interstitial opacities suspicious for mild pulmonary edema. Right upper extremity catheter tip overlies the distal SVC. Atherosclerotic calcifications. No pneumothorax. IMPRESSION: Cardiomegaly with small bilateral pleural effusions. Diffuse increased interstitial opacity, suspicious for mild pulmonary edema. Electronically Signed   By: Donavan Foil M.D.   On: 09/24/2016 01:14   Dg Chest 2 View  Result Date: 09/09/2016 CLINICAL DATA:  Central chest pain on off for 4 weeks. History of CHF, diabetes, hypertension, coronary artery disease, gastroesophageal reflux disease, DVT, COPD, atrial fibrillation, chronic kidney disease, coronary artery bypass. EXAM: CHEST  2 VIEW COMPARISON:  09/01/2016 FINDINGS: Postoperative changes in the mediastinum. Cardiac enlargement without significant vascular congestion. Increasing interstitial pattern to the lung bases particularly on the left may indicate developing interstitial edema or pneumonia. Small left pleural effusion has developed since previous study. Atelectasis or infiltration in the left lung base is also developing. Calcification of the aorta. No pneumothorax. Right PICC line with tip over the low SVC region. Degenerative changes in the spine and  shoulders. IMPRESSION: Cardiac enlargement. Developing consolidation or atelectasis in the left lung base with new small left pleural effusion and increasing bibasilar interstitial changes. Electronically Signed   By: Lucienne Capers M.D.   On: 09/09/2016 21:08   Dg Chest 2 View  Result Date: 09/01/2016 CLINICAL DATA:  Generalized abdominal and chest pain. EXAM: CHEST  2 VIEW COMPARISON:  08/27/2016. FINDINGS: Stable cardiomegaly with aortic atherosclerosis. Status post CABG. Mild interstitial edema with atelectasis at the lung bases. No pneumonic consolidation, effusion or pneumothorax. No acute osseous abnormality. IMPRESSION: 1. Stable cardiomegaly with aortic atherosclerosis and post CABG change. 2. Bibasilar atelectasis with minimal interstitial edema. Electronically Signed   By: Ashley Royalty M.D.   On: 09/01/2016 14:28   Ct Chest W Contrast  Result Date: 09/11/2016 CLINICAL DATA:  83 year old male with intermittent chest pain. Unresolved pneumonia. EXAM: CT CHEST WITH CONTRAST TECHNIQUE: Multidetector CT imaging of the chest was performed during intravenous contrast administration. CONTRAST:  48mL ISOVUE-300 IOPAMIDOL (ISOVUE-300) INJECTION 61% COMPARISON:  Chest radiographs 09/09/2016 and earlier. Chest CTs  06/10/2016 and 01/11/2015 FINDINGS: Cardiovascular: Prior CABG. Stable cardiomegaly. No pericardial effusion. Calcified aortic atherosclerosis. Mediastinum/Nodes: Stable small mediastinal lymph nodes. No lymphadenopathy. Stable and negative for age thyroid. Lungs/Pleura: Major airways are patent, with no residual retained secretions which were visible in the trachea in June. Layering small to moderate bilateral pleural effusions are progressed on the left and new on the right since June. Associated left greater than right confluent lower lobe opacity has also progressed, and there are associated air bronchograms, but the affected lung appears to be enhancing, favoring atelectasis over consolidation.  Mild additional superimposed bilateral dependent and compressive atelectasis. No superimposed pulmonary nodularity. Upper Abdomen: Stable visualized upper abdominal viscera with no significant abnormality. Musculoskeletal: Osteopenia. Prior sternotomy. No thoracic vertebral fracture or acute osseous finding identified. IMPRESSION: 1. New/increased bilateral layering pleural effusions since June, small to moderate and greater on the left. 2. Associated increased and confluent bilateral lower lobe opacity, greater on the left, with air bronchograms. Burtis Junes this is atelectasis, although bilateral lower lobe pneumonia is difficult to exclude. 3. Otherwise stable chest including cardiomegaly, Aortic Atherosclerosis (ICD10-I70.0), prior CABG. Electronically Signed   By: Genevie Ann M.D.   On: 09/11/2016 10:10   US Venous Img Upper Uni Right  Result Date: 09/13/2016 CLINICAL DATA:  Right upper extremity pain with hand edema for 2 weeks. Right-sided PICC line in place. EXAM: RIGHT UPPER EXTREMITY VENOUS DOPPLER ULTRASOUND TECHNIQUE: Gray-scale sonography with graded compression, as well as color Doppler and duplex ultrasound were performed to evaluate the upper extremity deep venous system from the level of the subclavian vein and including the jugular, axillary, basilic, radial, ulnar and upper cephalic vein. Spectral Doppler was utilized to evaluate flow at rest and with distal augmentation maneuvers. COMPARISON:  None. FINDINGS: Contralateral Subclavian Vein: Respiratory phasicity is normal and symmetric with the symptomatic side. No evidence of thrombus. Normal compressibility. Internal Jugular Vein: No evidence of thrombus. Normal compressibility, respiratory phasicity and response to augmentation. Subclavian Vein: No evidence of thrombus. Normal compressibility, respiratory phasicity and response to augmentation. Axillary Vein: No evidence of thrombus. Normal compressibility, respiratory phasicity and response to  augmentation. Cephalic Vein: No evidence of thrombus. Normal compressibility, respiratory phasicity and response to augmentation. Basilic Vein: No evidence of thrombus. Normal compressibility, respiratory phasicity and response to augmentation. Brachial Veins: No evidence of thrombus. Normal compressibility, respiratory phasicity and response to augmentation. Radial Veins: No evidence of thrombus. Normal compressibility, respiratory phasicity and response to augmentation. Ulnar Veins: No evidence of thrombus. Normal compressibility, respiratory phasicity and response to augmentation. Venous Reflux:  None visualized. Other Findings: PICC line in place. No thrombus seen along the course of the PICC line. Ill-defined edema within the subcutaneous soft tissues of the right arm. IMPRESSION: No evidence of DVT within the right upper extremity. Ill-defined edema within the subcutaneous soft tissues. Electronically Signed   By: Franki Cabot M.D.   On: 09/13/2016 13:18   US Arterial Seg Single  Result Date: 09/02/2016 CLINICAL DATA:  83 year old male with left foot puncture wound and cellulitis. EXAM: NONINVASIVE PHYSIOLOGIC VASCULAR STUDY OF BILATERAL LOWER EXTREMITIES TECHNIQUE: Evaluation of both lower extremities was performed at rest, including calculation of ankle-brachial indices, multiple segmental pressure evaluation, segmental Doppler and segmental pulse volume recording. COMPARISON:  None. FINDINGS: Right ABI:  1.0 Left ABI:  1.2 Right Lower Extremity: Normal posterior tibial arterial waveform. The dorsalis pedis waveform is slightly abnormal suggesting an element of anterior tibial or small vessel disease. Left Lower Extremity: Normal posterior tibial waveform. The  dorsalis pedis waveform is slightly abnormal suggesting anterior tibial or small vessel disease. IMPRESSION: Normal bilateral resting ankle-brachial indices. Signed, Criselda Peaches, MD [facility]  [facility] Electronically Signed   By: Jacqulynn Cadet M.D.   On: 09/02/2016 11:10   Dg Chest Port 1 View  Result Date: 09/12/2016 CLINICAL DATA:  PICC line placement. EXAM: PORTABLE CHEST 1 VIEW COMPARISON:  Chest x-ray dated 09/09/2016. FINDINGS: Right-sided PICC line in place. Tip appears stable in position at the level of the mid SVC. Heart size and mediastinal contours are stable. Opacity at the left lung base appears stable, likely atelectasis and/or small effusion. No new lung findings. IMPRESSION: PICC line appears adequately positioned at the level of the mid SVC. Electronically Signed   By: Franki Cabot M.D.   On: 09/12/2016 13:04   Dg Foot Complete Left  Result Date: 09/01/2016 CLINICAL DATA:  Left foot redness. EXAM: LEFT FOOT - COMPLETE 3+ VIEW COMPARISON:  08/20/2016 FINDINGS: There is no evidence of fracture or dislocation. There is no periosteal reaction or bone destruction. There soft tissue emphysema between the third and fourth metatarsals concerning for infection. IMPRESSION: 1. No evidence of osteomyelitis of the left foot. 2. Soft tissue emphysema between the third and fourth metatarsals concerning for necrotizing infection. Electronically Signed   By: Kathreen Devoid   On: 09/01/2016 14:29    Assessment/Plan   History of weight gain with history of diastolic CHF-it appears to be slowly gaining weight again he had significant diuresis in the hospital lost weight-weight is up about 3 pounds-this was discussed with Dr. Lyndel Safe and will increase his Lasix to 40 mg twice a day-also will update again metabolic panel later this week to ensure stability of his electrolytes and renal function with a history of chronic kidney disease again creatinine yesterday was 1.61 BUN of 18 which is relatively baseline.  #2 history of left foot wound with history infection as noted above he appears to have some increased erythema per discussion with Dr. Lyndel Safe in the wound care nurse-will  start doxycycline 100 mg twice a day.  Also will x-ray the foot on Monday, October 1   #3 history of dysphagia again this has been a significant issue-has had esophageal dilation in the hospital with small study has been scheduled-right now he is largely eating soups-and per discussion with Dr. Lyndel Safe suspect the salt content is probably contributing to his weight gain--    [id number]

## 2016-09-30 ENCOUNTER — Other Ambulatory Visit (HOSPITAL_COMMUNITY): Payer: Self-pay | Admitting: Specialist

## 2016-09-30 DIAGNOSIS — R1319 Other dysphagia: Secondary | ICD-10-CM

## 2016-09-30 LAB — URINE CULTURE

## 2016-10-01 ENCOUNTER — Encounter (HOSPITAL_BASED_OUTPATIENT_CLINIC_OR_DEPARTMENT_OTHER): Payer: Medicare Other | Attending: Internal Medicine

## 2016-10-01 ENCOUNTER — Other Ambulatory Visit (HOSPITAL_COMMUNITY)
Admission: RE | Admit: 2016-10-01 | Discharge: 2016-10-01 | Disposition: A | Discharge status: Home / Self Care | Payer: Medicare Other | Source: Other Acute Inpatient Hospital | Attending: Internal Medicine | Admitting: Internal Medicine

## 2016-10-01 DIAGNOSIS — S81802A Unspecified open wound, left lower leg, initial encounter: Secondary | ICD-10-CM | POA: Insufficient documentation

## 2016-10-01 DIAGNOSIS — B998 Other infectious disease: Secondary | ICD-10-CM | POA: Insufficient documentation

## 2016-10-05 ENCOUNTER — Encounter: Payer: Self-pay | Admitting: Internal Medicine

## 2016-10-05 NOTE — Progress Notes (Signed)
Location:   [facility] Room Number: 129/P Place of Service:  SNF (31) Provider:  Jeannine Kitten, MD  Patient Care Team: [name], MD as PCP - General (Internal Medicine) [name], MD (Internal Medicine) [name] [name], MD as Consulting Physician (Gastroenterology)  Extended Emergency Contact Information Primary Emergency Contact: Thamas Jaegers, [place] [address] Johnnette Litter of [place] Phone: [phone] Mobile Phone: [phone] Relation: Daughter Secondary Emergency Contact: Eugene Gavia [place] of [place] Phone: [phone] Relation: Brother  Code Status:  Full Code Goals of care: Advanced Directive information Advanced Directives 10/05/2016  Does Patient Have a Medical Advance Directive? Yes  Type of Advance Directive (No Data)  Does patient want to make changes to medical advance directive? No - Patient declined  Copy of [place] in Chart? -  Would patient like information on creating a medical advance directive? No - Patient declined     Chief Complaint  Patient presents with  . Acute Visit    Wound Care    HPI:  Pt is a 83 y.o. male seen today for an acute visit for    Past Medical History:  Diagnosis Date  . BPH (benign prostatic hyperplasia)   . CAD (coronary artery disease)    Multivessel status post CABG 2011 - LIMA to LAD, SVG to diagonal, SVG to OM, SVG to PDA  . Cellulitis    12/15  . Chronic back pain   . Chronic diastolic CHF (congestive heart failure) ([place])   . CKD (chronic kidney disease), stage III ([place])   . COPD (chronic obstructive pulmonary disease) ([place])   . Essential hypertension   . Gastric mass    EGD 9/15  . GERD (gastroesophageal reflux disease)   . History of DVT (deep vein thrombosis)    Postphlebitic syndrome  . History of kidney stones   . HOH (hard of hearing)   . Hx of CABG   . Hyperlipidemia   .  Persistent atrial fibrillation ([place])    a. s/p DCCV 03/2016.  [name] Sleep apnea    Stop Bang score of 5  . Type 2 diabetes mellitus ([place])    Past Surgical History:  Procedure Laterality Date  . CARDIOVERSION N/A 03/20/2016   Procedure: CARDIOVERSION;  Surgeon: Arnoldo Lenis, MD;  Location[facility];  Service: Endoscopy;  Laterality: N/A;  . CATARACT EXTRACTION W/PHACO Left 02/07/2016   Procedure: CATARACT EXTRACTION PHACO AND INTRAOCULAR LENS PLACEMENT (IOC);  Surgeon: [name], MD;  Location: [facility];  Service: Ophthalmology;  Laterality: Left;  CDE:  23.13  . COLONOSCOPY  2004   Dr. Laural Golden: three small polyps at cecum, path unknown, external hemorrhoids  . COLONOSCOPY N/A 08/16/2013   Dr. [name]: incomplete prep. multiple tubular adenomas, multiple biopsies. Needs surveillance in Aug 2016 due to poor prep  . CORONARY ARTERY BYPASS GRAFT     x5  . CORONARY ARTERY BYPASS GRAFT  2011  . CYSTOSCOPY N/A 12/12/2012   Procedure: CYSTOSCOPY FLEXIBLE;  Surgeon: Marissa Nestle, MD;  Location: [facility];  Service: Urology;  Laterality: N/A;  . ESOPHAGEAL DILATION N/A 09/14/2016   Procedure: ESOPHAGEAL DILATION;  Surgeon: Daneil Dolin, MD;  [facility];  Service: Endoscopy;  Laterality: N/A;  . ESOPHAGOGASTRODUODENOSCOPY N/A 08/16/2013   Dr. [name]: normal esophagus s/p Maloney dilation, gastric erosions, submucosal gastric mass vs extrinsic mass  . ESOPHAGOGASTRODUODENOSCOPY (EGD) WITH PROPOFOL N/A 09/14/2016  Procedure: ESOPHAGOGASTRODUODENOSCOPY (EGD) WITH PROPOFOL;  Surgeon: Daneil Dolin, MD;  [facility];  Service: Endoscopy;  Laterality: N/A;  . EUS N/A 09/07/2013   Dr. 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 INCISION AND DRAINAGE ABSCESS N/A 12/20/2013   Procedure: INCISION AND DRAINAGE ABSCESS NECK;  Surgeon: Jamesetta So, MD;  Location: [facility];  Service: General;  Laterality: N/A;  . INCISION AND DRAINAGE ABSCESS Left 09/04/2016    Procedure: INCISION AND DRAINAGE ABSCESS;  Surgeon: Aviva Signs, MD;  Location: [facility];  Service: General;  Laterality: Left;  [name] MALONEY DILATION N/A 08/16/2013   Procedure: Venia Minks DILATION;  Surgeon: Daneil Dolin, MD;  Location: [facility];  Service: Endoscopy;  Laterality: N/A;    No Known Allergies  Outpatient Encounter Prescriptions as of 10/05/2016  Medication Sig  . acetaminophen (TYLENOL) 325 MG tablet Take 650 mg by mouth every 6 (six) hours as needed.  . Amino Acids-Protein Hydrolys (FEEDING SUPPLEMENT, PRO-STAT SUGAR FREE 64,) LIQD Take 30 mLs by mouth 2 (two) times daily between meals.  [name] amLODipine (NORVASC) 10 MG tablet Take 1 tablet (10 mg total) by mouth daily.  [name] antiseptic oral rinse (BIOTENE) LIQD 15 mLs by Mouth Rinse route 4 (four) times daily.  [name] apixaban (ELIQUIS) 5 MG TABS tablet Take 5 mg by mouth 2 (two) times daily.  . cloNIDine (CATAPRES) 0.1 MG tablet Take 0.1 mg by mouth 2 (two) times daily.  [name] docusate sodium (COLACE) 100 MG capsule Take 100 mg by mouth 2 (two) times daily.  [name] doxycycline (VIBRAMYCIN) 100 MG capsule Take 100 mg by mouth 2 (two) times daily.  . Ferrous Sulfate (IRON) 325 (65 Fe) MG TABS Give 1 tablet by mouth once a day  . FLUoxetine (PROZAC) 20 MG/5ML solution Take 5 mLs (20 mg total) by mouth daily.  . furosemide (LASIX) 20 MG tablet Take 40 mg by mouth 2 (two) times daily.   [name] GLUCERNA (GLUCERNA) LIQD Take 237 mLs by mouth 2 (two) times daily between meals.  . insulin glargine (LANTUS) 100 UNIT/ML injection Inject 0.3 mLs (30 Units total) into the skin at bedtime.  [name] ipratropium-albuterol (DUONEB) 0.5-2.5 (3) MG/3ML SOLN Take 3 mLs by nebulization every 6 (six) hours as needed.  . irbesartan (AVAPRO) 75 MG tablet Take 1 tablet (75 mg total) by mouth daily.  [name] LORazepam (ATIVAN) 0.5 MG tablet Take 1 tablet (0.5 mg total) by mouth 4 (four) times daily.  . metoprolol succinate (TOPROL-XL) 25 MG 24 hr tablet Take 3 tablets (75 mg total) by  mouth daily. Take with or immediately following a meal.  . nitroGLYCERIN (NITROSTAT) 0.4 MG SL tablet Place 1 tablet (0.4 mg total) under the tongue every 5 (five) minutes as needed for chest pain.  [name] oxyCODONE (ROXICODONE) 15 MG immediate release tablet Take 15 mg by mouth every 8 (eight) hours as needed for pain.   . pantoprazole (PROTONIX) 40 MG tablet Take 1 tablet (40 mg total) by mouth daily.  . potassium chloride SA (K-DUR,KLOR-CON) 20 MEQ tablet Take 1 tablet (20 mEq total) by mouth daily.  . Probiotic Product (RISA-BID PROBIOTIC) TABS Take 1 tablet by mouth twice a day  . rosuvastatin (CRESTOR) 10 MG tablet Take 10 mg by mouth daily.  . tamsulosin (FLOMAX) 0.4 MG CAPS capsule Take 1 capsule (0.4 mg total) by mouth daily.   No facility-administered encounter medications on file as of 10/05/2016.      Review of  Systems  Immunization History  Administered Date(s) Administered  . Influenza, High Dose Seasonal PF 09/25/2016  . Influenza,inj,Quad PF,6+ Mos 12/22/2013  . Tdap 08/20/2016   Pertinent  Health Maintenance Due  Topic Date Due  . FOOT EXAM  10/16/2016 (Originally 08/07/1941)  . OPHTHALMOLOGY EXAM  10/16/2016 (Originally 08/07/1941)  . URINE MICROALBUMIN  10/16/2016 (Originally 08/07/1941)  . PNA vac Low Risk Adult (1 of 2 - PCV13) 12/05/2016 (Originally 08/07/1998)  . HEMOGLOBIN A1C  03/24/2017  . INFLUENZA VACCINE  Completed   No flowsheet data found. Functional Status Survey:    There were no vitals filed for this visit. There is no height or weight on file to calculate BMI. Physical Exam  Labs reviewed:  Recent Labs  03/21/16 0556  07/25/16 0528  07/27/16 0815  08/25/16 0553 08/26/16 0526  09/25/16 0651 09/26/16 0611 09/28/16 0330  NA 138  < > 145  < > 140  < > 136 135  < > 137 137 134*  K 3.8  < > 4.0  < > 4.2  < > 4.1 4.5  < > 3.9 3.8 4.0  CL 99*  < > 105  < > 101  < > 104 105  < > 94* 96* 92*  CO2 30  < > 31  < > 30  < > 23 22  < > 35* 34* 33*  GLUCOSE  232*  < > 143*  < > 161*  < > 74 160*  < > 150* 123* 109*  BUN 32*  < > 25*  < > 42*  < > 70* 70*  < > 14 18 18   CREATININE 1.79*  < > 1.57*  < > 2.19*  < > 2.42* 2.10*  < > 1.44* 1.57* 1.61*  CALCIUM 9.6  < > 9.5  < > 9.1  < > 9.2 9.1  < > 10.0 9.9 9.9  MG 1.9  --  2.0  --  2.1  --   --   --   --   --   --   --   PHOS  --   --   --   --   --   --  5.3* 5.2*  --   --   --   --   < > = values in this interval not displayed.  Recent Labs  09/01/16 1100 09/10/16 0648 09/24/16 0021  AST 19 16 16   ALT 35 22 13*  ALKPHOS 201* 142* 113  BILITOT 1.1 0.5 0.5  PROT 7.4 5.5* 6.4*  ALBUMIN 3.1* 2.2* 2.8*    Recent Labs  09/17/16 0717 09/22/16 0700 09/24/16 0021 09/24/16 0404 09/26/16 0611 09/28/16 0330  WBC 12.9* 12.6* 10.8* 11.2* 9.8 9.2  NEUTROABS 9.8* 9.8* 7.9*  --   --   --   HGB 9.1* 8.7* 8.6* 8.0* 7.8* 8.6*  HCT 28.7* 27.7* 26.6* 24.9* 26.1* 27.9*  MCV 85.7 85.8 85.0 84.7 89.4 88.6  PLT 369 323 249 252 226 220   Lab Results  Component Value Date   TSH 1.721 09/09/2016   Lab Results  Component Value Date   HGBA1C 8.5 (H) 09/24/2016   Lab Results  Component Value Date   CHOL 114 09/17/2016   HDL 23 (L) 09/17/2016   LDLCALC 65 09/17/2016   TRIG 130 09/17/2016   CHOLHDL 5.0 09/17/2016    Significant Diagnostic Results in last 30 days:  Dg Chest 2 View  Result Date: 09/24/2016 CLINICAL DATA:  Shortness of breath and  chest pain EXAM: CHEST  2 VIEW COMPARISON:  09/12/2016 FINDINGS: Post sternotomy changes. Small bilateral pleural effusions. Mild cardiomegaly with diffuse increased interstitial opacities suspicious for mild pulmonary edema. Right upper extremity catheter tip overlies the distal SVC. Atherosclerotic calcifications. No pneumothorax. IMPRESSION: Cardiomegaly with small bilateral pleural effusions. Diffuse increased interstitial opacity, suspicious for mild pulmonary edema. Electronically Signed   By: Donavan Foil M.D.   On: 09/24/2016 01:14   Dg Chest 2  View  Result Date: 09/09/2016 CLINICAL DATA:  Central chest pain on off for 4 weeks. History of CHF, diabetes, hypertension, coronary artery disease, gastroesophageal reflux disease, DVT, COPD, atrial fibrillation, chronic kidney disease, coronary artery bypass. EXAM: CHEST  2 VIEW COMPARISON:  09/01/2016 FINDINGS: Postoperative changes in the mediastinum. Cardiac enlargement without significant vascular congestion. Increasing interstitial pattern to the lung bases particularly on the left may indicate developing interstitial edema or pneumonia. Small left pleural effusion has developed since previous study. Atelectasis or infiltration in the left lung base is also developing. Calcification of the aorta. No pneumothorax. Right PICC line with tip over the low SVC region. Degenerative changes in the spine and shoulders. IMPRESSION: Cardiac enlargement. Developing consolidation or atelectasis in the left lung base with new small left pleural effusion and increasing bibasilar interstitial changes. Electronically Signed   By: Lucienne Capers M.D.   On: 09/09/2016 21:08   Ct Chest W Contrast  Result Date: 09/11/2016 CLINICAL DATA:  83 year old male with intermittent chest pain. Unresolved pneumonia. EXAM: CT CHEST WITH CONTRAST TECHNIQUE: Multidetector CT imaging of the chest was performed during intravenous contrast administration. CONTRAST:  76mL ISOVUE-300 IOPAMIDOL (ISOVUE-300) INJECTION 61% COMPARISON:  Chest radiographs 09/09/2016 and earlier. Chest CTs 06/10/2016 and 01/11/2015 FINDINGS: Cardiovascular: Prior CABG. Stable cardiomegaly. No pericardial effusion. Calcified aortic atherosclerosis. Mediastinum/Nodes: Stable small mediastinal lymph nodes. No lymphadenopathy. Stable and negative for age thyroid. Lungs/Pleura: Major airways are patent, with no residual retained secretions which were visible in the trachea in June. Layering small to moderate bilateral pleural effusions are progressed on the left and  new on the right since June. Associated left greater than right confluent lower lobe opacity has also progressed, and there are associated air bronchograms, but the affected lung appears to be enhancing, favoring atelectasis over consolidation. Mild additional superimposed bilateral dependent and compressive atelectasis. No superimposed pulmonary nodularity. Upper Abdomen: Stable visualized upper abdominal viscera with no significant abnormality. Musculoskeletal: Osteopenia. Prior sternotomy. No thoracic vertebral fracture or acute osseous finding identified. IMPRESSION: 1. New/increased bilateral layering pleural effusions since June, small to moderate and greater on the left. 2. Associated increased and confluent bilateral lower lobe opacity, greater on the left, with air bronchograms. Burtis Junes this is atelectasis, although bilateral lower lobe pneumonia is difficult to exclude. 3. Otherwise stable chest including cardiomegaly, Aortic Atherosclerosis (ICD10-I70.0), prior CABG. Electronically Signed   By: Genevie Ann M.D.   On: 09/11/2016 10:10   US Venous Img Upper Uni Right  Result Date: 09/13/2016 CLINICAL DATA:  Right upper extremity pain with hand edema for 2 weeks. Right-sided PICC line in place. EXAM: RIGHT UPPER EXTREMITY VENOUS DOPPLER ULTRASOUND TECHNIQUE: Gray-scale sonography with graded compression, as well as color Doppler and duplex ultrasound were performed to evaluate the upper extremity deep venous system from the level of the subclavian vein and including the jugular, axillary, basilic, radial, ulnar and upper cephalic vein. Spectral Doppler was utilized to evaluate flow at rest and with distal augmentation maneuvers. COMPARISON:  None. FINDINGS: Contralateral Subclavian Vein: Respiratory phasicity is normal and  symmetric with the symptomatic side. No evidence of thrombus. Normal compressibility. Internal Jugular Vein: No evidence of thrombus. Normal compressibility, respiratory phasicity and  response to augmentation. Subclavian Vein: No evidence of thrombus. Normal compressibility, respiratory phasicity and response to augmentation. Axillary Vein: No evidence of thrombus. Normal compressibility, respiratory phasicity and response to augmentation. Cephalic Vein: No evidence of thrombus. Normal compressibility, respiratory phasicity and response to augmentation. Basilic Vein: No evidence of thrombus. Normal compressibility, respiratory phasicity and response to augmentation. Brachial Veins: No evidence of thrombus. Normal compressibility, respiratory phasicity and response to augmentation. Radial Veins: No evidence of thrombus. Normal compressibility, respiratory phasicity and response to augmentation. Ulnar Veins: No evidence of thrombus. Normal compressibility, respiratory phasicity and response to augmentation. Venous Reflux:  None visualized. Other Findings: PICC line in place. No thrombus seen along the course of the PICC line. Ill-defined edema within the subcutaneous soft tissues of the right arm. IMPRESSION: No evidence of DVT within the right upper extremity. Ill-defined edema within the subcutaneous soft tissues. Electronically Signed   By: Franki Cabot M.D.   On: 09/13/2016 13:18   Dg Chest Port 1 View  Result Date: 09/12/2016 CLINICAL DATA:  PICC line placement. EXAM: PORTABLE CHEST 1 VIEW COMPARISON:  Chest x-ray dated 09/09/2016. FINDINGS: Right-sided PICC line in place. Tip appears stable in position at the level of the mid SVC. Heart size and mediastinal contours are stable. Opacity at the left lung base appears stable, likely atelectasis and/or small effusion. No new lung findings. IMPRESSION: PICC line appears adequately positioned at the level of the mid SVC. Electronically Signed   By: Franki Cabot M.D.   On: 09/12/2016 13:04    Assessment/Plan There are no diagnoses linked to this encounter.   Family/ staff Communication:   Labs/tests ordered:     This encounter was  created in error - please disregard.

## 2016-10-06 ENCOUNTER — Non-Acute Institutional Stay (SKILLED_NURSING_FACILITY): Payer: Medicare Other | Admitting: Internal Medicine

## 2016-10-06 ENCOUNTER — Ambulatory Visit (HOSPITAL_COMMUNITY)
Admit: 2016-10-06 | Discharge: 2016-10-06 | Disposition: A | Discharge status: Home / Self Care | Payer: Medicare Other | Attending: Internal Medicine | Admitting: Internal Medicine

## 2016-10-06 ENCOUNTER — Inpatient Hospital Stay (HOSPITAL_COMMUNITY)
Admission: EM | Admit: 2016-10-06 | Discharge: 2016-10-10 | DRG: 617 | Disposition: A | Discharge status: Skilled Nursing Facility | Payer: Medicare Other | Attending: Internal Medicine | Admitting: Internal Medicine

## 2016-10-06 ENCOUNTER — Encounter: Payer: Self-pay | Admitting: Internal Medicine

## 2016-10-06 ENCOUNTER — Ambulatory Visit (HOSPITAL_COMMUNITY): Payer: Medicare Other | Attending: Internal Medicine | Admitting: Speech Pathology

## 2016-10-06 ENCOUNTER — Encounter (HOSPITAL_COMMUNITY)
Admission: RE | Admit: 2016-10-06 | Discharge: 2016-10-06 | Disposition: A | Discharge status: Home / Self Care | Payer: Medicare Other | Source: Skilled Nursing Facility | Attending: Internal Medicine | Admitting: Internal Medicine

## 2016-10-06 ENCOUNTER — Encounter (HOSPITAL_COMMUNITY): Payer: Self-pay | Admitting: Emergency Medicine

## 2016-10-06 DIAGNOSIS — E11628 Type 2 diabetes mellitus with other skin complications: Secondary | ICD-10-CM

## 2016-10-06 DIAGNOSIS — E1169 Type 2 diabetes mellitus with other specified complication: Secondary | ICD-10-CM | POA: Diagnosis present

## 2016-10-06 DIAGNOSIS — Z8711 Personal history of peptic ulcer disease: Secondary | ICD-10-CM

## 2016-10-06 DIAGNOSIS — R1312 Dysphagia, oropharyngeal phase: Secondary | ICD-10-CM | POA: Diagnosis present

## 2016-10-06 DIAGNOSIS — D631 Anemia in chronic kidney disease: Secondary | ICD-10-CM | POA: Diagnosis present

## 2016-10-06 DIAGNOSIS — Z66 Do not resuscitate: Secondary | ICD-10-CM | POA: Diagnosis present

## 2016-10-06 DIAGNOSIS — L03119 Cellulitis of unspecified part of limb: Secondary | ICD-10-CM | POA: Diagnosis not present

## 2016-10-06 DIAGNOSIS — E11621 Type 2 diabetes mellitus with foot ulcer: Secondary | ICD-10-CM | POA: Diagnosis present

## 2016-10-06 DIAGNOSIS — Z951 Presence of aortocoronary bypass graft: Secondary | ICD-10-CM

## 2016-10-06 DIAGNOSIS — L97528 Non-pressure chronic ulcer of other part of left foot with other specified severity: Secondary | ICD-10-CM | POA: Diagnosis present

## 2016-10-06 DIAGNOSIS — K5900 Constipation, unspecified: Secondary | ICD-10-CM | POA: Diagnosis present

## 2016-10-06 DIAGNOSIS — B952 Enterococcus as the cause of diseases classified elsewhere: Secondary | ICD-10-CM | POA: Diagnosis present

## 2016-10-06 DIAGNOSIS — L02619 Cutaneous abscess of unspecified foot: Secondary | ICD-10-CM | POA: Diagnosis present

## 2016-10-06 DIAGNOSIS — E785 Hyperlipidemia, unspecified: Secondary | ICD-10-CM | POA: Diagnosis present

## 2016-10-06 DIAGNOSIS — I10 Essential (primary) hypertension: Secondary | ICD-10-CM | POA: Diagnosis not present

## 2016-10-06 DIAGNOSIS — Z9981 Dependence on supplemental oxygen: Secondary | ICD-10-CM

## 2016-10-06 DIAGNOSIS — N183 Chronic kidney disease, stage 3 unspecified: Secondary | ICD-10-CM | POA: Diagnosis present

## 2016-10-06 DIAGNOSIS — G4730 Sleep apnea, unspecified: Secondary | ICD-10-CM | POA: Diagnosis present

## 2016-10-06 DIAGNOSIS — Z87891 Personal history of nicotine dependence: Secondary | ICD-10-CM

## 2016-10-06 DIAGNOSIS — J449 Chronic obstructive pulmonary disease, unspecified: Secondary | ICD-10-CM | POA: Diagnosis present

## 2016-10-06 DIAGNOSIS — I4891 Unspecified atrial fibrillation: Secondary | ICD-10-CM | POA: Diagnosis present

## 2016-10-06 DIAGNOSIS — K219 Gastro-esophageal reflux disease without esophagitis: Secondary | ICD-10-CM | POA: Diagnosis present

## 2016-10-06 DIAGNOSIS — B961 Klebsiella pneumoniae [K. pneumoniae] as the cause of diseases classified elsewhere: Secondary | ICD-10-CM | POA: Diagnosis present

## 2016-10-06 DIAGNOSIS — Z48817 Encounter for surgical aftercare following surgery on the skin and subcutaneous tissue: Secondary | ICD-10-CM | POA: Insufficient documentation

## 2016-10-06 DIAGNOSIS — L089 Local infection of the skin and subcutaneous tissue, unspecified: Secondary | ICD-10-CM | POA: Diagnosis not present

## 2016-10-06 DIAGNOSIS — M869 Osteomyelitis, unspecified: Secondary | ICD-10-CM | POA: Diagnosis present

## 2016-10-06 DIAGNOSIS — M86172 Other acute osteomyelitis, left ankle and foot: Secondary | ICD-10-CM | POA: Diagnosis present

## 2016-10-06 DIAGNOSIS — L02612 Cutaneous abscess of left foot: Secondary | ICD-10-CM | POA: Diagnosis not present

## 2016-10-06 DIAGNOSIS — E1100 Type 2 diabetes mellitus with hyperosmolarity without nonketotic hyperglycemic-hyperosmolar coma (NKHHC): Secondary | ICD-10-CM

## 2016-10-06 DIAGNOSIS — N179 Acute kidney failure, unspecified: Secondary | ICD-10-CM | POA: Diagnosis present

## 2016-10-06 DIAGNOSIS — I5032 Chronic diastolic (congestive) heart failure: Secondary | ICD-10-CM | POA: Diagnosis present

## 2016-10-06 DIAGNOSIS — Z86718 Personal history of other venous thrombosis and embolism: Secondary | ICD-10-CM | POA: Diagnosis not present

## 2016-10-06 DIAGNOSIS — R05 Cough: Secondary | ICD-10-CM | POA: Diagnosis not present

## 2016-10-06 DIAGNOSIS — E1165 Type 2 diabetes mellitus with hyperglycemia: Secondary | ICD-10-CM | POA: Insufficient documentation

## 2016-10-06 DIAGNOSIS — I2510 Atherosclerotic heart disease of native coronary artery without angina pectoris: Secondary | ICD-10-CM | POA: Diagnosis present

## 2016-10-06 DIAGNOSIS — R1319 Other dysphagia: Secondary | ICD-10-CM

## 2016-10-06 DIAGNOSIS — E1122 Type 2 diabetes mellitus with diabetic chronic kidney disease: Secondary | ICD-10-CM | POA: Diagnosis present

## 2016-10-06 DIAGNOSIS — I130 Hypertensive heart and chronic kidney disease with heart failure and stage 1 through stage 4 chronic kidney disease, or unspecified chronic kidney disease: Secondary | ICD-10-CM | POA: Diagnosis present

## 2016-10-06 DIAGNOSIS — E119 Type 2 diabetes mellitus without complications: Secondary | ICD-10-CM

## 2016-10-06 DIAGNOSIS — Z794 Long term (current) use of insulin: Secondary | ICD-10-CM

## 2016-10-06 DIAGNOSIS — Z800 Family history of malignant neoplasm of digestive organs: Secondary | ICD-10-CM

## 2016-10-06 DIAGNOSIS — R059 Cough, unspecified: Secondary | ICD-10-CM

## 2016-10-06 DIAGNOSIS — N400 Enlarged prostate without lower urinary tract symptoms: Secondary | ICD-10-CM | POA: Diagnosis present

## 2016-10-06 DIAGNOSIS — Z7901 Long term (current) use of anticoagulants: Secondary | ICD-10-CM

## 2016-10-06 DIAGNOSIS — J189 Pneumonia, unspecified organism: Secondary | ICD-10-CM | POA: Insufficient documentation

## 2016-10-06 DIAGNOSIS — Z87442 Personal history of urinary calculi: Secondary | ICD-10-CM

## 2016-10-06 DIAGNOSIS — Z79899 Other long term (current) drug therapy: Secondary | ICD-10-CM

## 2016-10-06 LAB — C-REACTIVE PROTEIN: CRP: <0.8 mg/dL (ref <1.0)

## 2016-10-06 LAB — COMPREHENSIVE METABOLIC PANEL
ALT: 20 U/L (ref 17–63)
ANION GAP: 6 (ref 5–15)
AST: 28 U/L (ref 15–41)
Albumin: 3.2 g/dL — ABNORMAL LOW (ref 3.5–5.0)
Alkaline Phosphatase: 101 U/L (ref 38–126)
BUN: 34 mg/dL — ABNORMAL HIGH (ref 6–20)
CHLORIDE: 94 mmol/L — AB (ref 101–111)
CO2: 32 mmol/L (ref 22–32)
CREATININE: 2.09 mg/dL — AB (ref 0.61–1.24)
Calcium: 10.2 mg/dL (ref 8.9–10.3)
GFR, EST AFRICAN AMERICAN: 32 mL/min — AB (ref >60)
GFR, EST NON AFRICAN AMERICAN: 28 mL/min — AB (ref >60)
Glucose, Bld: 213 mg/dL — ABNORMAL HIGH (ref 65–99)
POTASSIUM: 4.8 mmol/L (ref 3.5–5.1)
SODIUM: 132 mmol/L — AB (ref 135–145)
Total Bilirubin: 0.8 mg/dL (ref 0.3–1.2)
Total Protein: 6.6 g/dL (ref 6.5–8.1)

## 2016-10-06 LAB — GLUCOSE, CAPILLARY: Glucose-Capillary: 177 mg/dL — ABNORMAL HIGH (ref 65–99)

## 2016-10-06 LAB — CBC WITH DIFFERENTIAL/PLATELET
Basophils Absolute: 0 10*3/uL (ref 0.0–0.1)
Basophils Relative: 1 %
EOS ABS: 0.3 10*3/uL (ref 0.0–0.7)
Eosinophils Relative: 3 %
HEMATOCRIT: 26.7 % — AB (ref 39.0–52.0)
HEMOGLOBIN: 8.4 g/dL — AB (ref 13.0–17.0)
LYMPHS ABS: 0.9 10*3/uL (ref 0.7–4.0)
LYMPHS PCT: 10 %
MCH: 27.1 pg (ref 26.0–34.0)
MCHC: 31.5 g/dL (ref 30.0–36.0)
MCV: 86.1 fL (ref 78.0–100.0)
MONOS PCT: 10 %
Monocytes Absolute: 0.8 10*3/uL (ref 0.1–1.0)
NEUTROS ABS: 6.5 10*3/uL (ref 1.7–7.7)
NEUTROS PCT: 76 %
Platelets: 229 10*3/uL (ref 150–400)
RBC: 3.1 MIL/uL — AB (ref 4.22–5.81)
RDW: 14.5 % (ref 11.5–15.5)
WBC: 8.5 10*3/uL (ref 4.0–10.5)

## 2016-10-06 LAB — CBG MONITORING, ED: Glucose-Capillary: 170 mg/dL — ABNORMAL HIGH (ref 65–99)

## 2016-10-06 LAB — OCCULT BLOOD X 1 CARD TO LAB, STOOL: FECAL OCCULT BLD: NEGATIVE

## 2016-10-06 LAB — PREALBUMIN: Prealbumin: 20.3 mg/dL (ref 18–38)

## 2016-10-06 MED ORDER — VANCOMYCIN HCL 10 G IV SOLR
2000.0000 mg | Freq: Once | INTRAVENOUS | Status: AC
  Administered 2016-10-06: 2000 mg via INTRAVENOUS
  Filled 2016-10-06: qty 2000

## 2016-10-06 MED ORDER — ONDANSETRON HCL 4 MG/2ML IJ SOLN
4.0000 mg | Freq: Four times a day (QID) | INTRAMUSCULAR | Status: DC | PRN
  Administered 2016-10-07: 4 mg via INTRAVENOUS
  Filled 2016-10-06: qty 2

## 2016-10-06 MED ORDER — CLONIDINE HCL 0.1 MG PO TABS
0.1000 mg | ORAL_TABLET | Freq: Two times a day (BID) | ORAL | Status: DC
  Administered 2016-10-07 – 2016-10-10 (×8): 0.1 mg via ORAL
  Filled 2016-10-06 (×8): qty 1

## 2016-10-06 MED ORDER — BACID PO TABS
1.0000 | ORAL_TABLET | Freq: Two times a day (BID) | ORAL | Status: DC
  Administered 2016-10-07 – 2016-10-10 (×7): 1 via ORAL
  Filled 2016-10-06 (×7): qty 1

## 2016-10-06 MED ORDER — METOPROLOL SUCCINATE ER 50 MG PO TB24
75.0000 mg | ORAL_TABLET | Freq: Every day | ORAL | Status: DC
  Administered 2016-10-07 – 2016-10-10 (×4): 75 mg via ORAL
  Filled 2016-10-06 (×4): qty 1

## 2016-10-06 MED ORDER — AMLODIPINE BESYLATE 10 MG PO TABS
10.0000 mg | ORAL_TABLET | Freq: Every day | ORAL | Status: DC
  Administered 2016-10-07: 10 mg via ORAL
  Filled 2016-10-06: qty 1

## 2016-10-06 MED ORDER — GLUCERNA SHAKE PO LIQD
237.0000 mL | Freq: Two times a day (BID) | ORAL | Status: DC
  Administered 2016-10-08 – 2016-10-10 (×5): 237 mL via ORAL

## 2016-10-06 MED ORDER — IPRATROPIUM-ALBUTEROL 0.5-2.5 (3) MG/3ML IN SOLN: 3.0000 mL | Freq: Four times a day (QID) | RESPIRATORY_TRACT | Status: DC | PRN

## 2016-10-06 MED ORDER — BIOTENE DRY MOUTH MT LIQD
15.0000 mL | Freq: Four times a day (QID) | OROMUCOSAL | Status: DC
  Administered 2016-10-07 – 2016-10-10 (×8): 15 mL via OROMUCOSAL
  Filled 2016-10-06: qty 15
  Filled 2016-10-06: qty 473

## 2016-10-06 MED ORDER — OXYCODONE HCL 5 MG PO TABS
15.0000 mg | ORAL_TABLET | Freq: Three times a day (TID) | ORAL | Status: DC | PRN
Start: 2016-10-06 — End: 2016-10-07

## 2016-10-06 MED ORDER — PANTOPRAZOLE SODIUM 40 MG PO TBEC
40.0000 mg | DELAYED_RELEASE_TABLET | Freq: Every day | ORAL | Status: DC
  Administered 2016-10-07 – 2016-10-10 (×4): 40 mg via ORAL
  Filled 2016-10-06 (×4): qty 1

## 2016-10-06 MED ORDER — INSULIN ASPART 100 UNIT/ML ~~LOC~~ SOLN
0.0000 [IU] | SUBCUTANEOUS | Status: DC
  Administered 2016-10-07 – 2016-10-08 (×4): 2 [IU] via SUBCUTANEOUS
  Administered 2016-10-08: 3 [IU] via SUBCUTANEOUS

## 2016-10-06 MED ORDER — PIPERACILLIN-TAZOBACTAM 3.375 G IVPB
3.3750 g | Freq: Three times a day (TID) | INTRAVENOUS | Status: DC
  Administered 2016-10-07 – 2016-10-08 (×4): 3.375 g via INTRAVENOUS
  Filled 2016-10-06 (×5): qty 50

## 2016-10-06 MED ORDER — VANCOMYCIN HCL IN DEXTROSE 1-5 GM/200ML-% IV SOLN: 1000.0000 mg | INTRAVENOUS | Status: DC

## 2016-10-06 MED ORDER — ROSUVASTATIN CALCIUM 10 MG PO TABS
10.0000 mg | ORAL_TABLET | Freq: Every day | ORAL | Status: DC
  Administered 2016-10-07 – 2016-10-09 (×4): 10 mg via ORAL
  Filled 2016-10-06 (×4): qty 1

## 2016-10-06 MED ORDER — DOCUSATE SODIUM 100 MG PO CAPS
100.0000 mg | ORAL_CAPSULE | Freq: Two times a day (BID) | ORAL | Status: DC
  Administered 2016-10-07 – 2016-10-10 (×8): 100 mg via ORAL
  Filled 2016-10-06 (×8): qty 1

## 2016-10-06 MED ORDER — PIPERACILLIN-TAZOBACTAM 3.375 G IVPB 30 MIN
3.3750 g | Freq: Once | INTRAVENOUS | Status: AC
  Administered 2016-10-06: 3.375 g via INTRAVENOUS
  Filled 2016-10-06: qty 50

## 2016-10-06 MED ORDER — FLUOXETINE HCL 20 MG/5ML PO SOLN
20.0000 mg | Freq: Every day | ORAL | Status: DC
  Filled 2016-10-06: qty 5

## 2016-10-06 MED ORDER — ONDANSETRON HCL 4 MG PO TABS: 4.0000 mg | ORAL_TABLET | Freq: Four times a day (QID) | ORAL | Status: DC | PRN

## 2016-10-06 MED ORDER — SODIUM CHLORIDE 0.9 % IV SOLN
INTRAVENOUS | Status: DC
  Administered 2016-10-07 – 2016-10-08 (×2): via INTRAVENOUS

## 2016-10-06 MED ORDER — LORAZEPAM 0.5 MG PO TABS: 0.5000 mg | ORAL_TABLET | Freq: Four times a day (QID) | ORAL | Status: DC | PRN

## 2016-10-06 MED ORDER — TAMSULOSIN HCL 0.4 MG PO CAPS
0.4000 mg | ORAL_CAPSULE | Freq: Every day | ORAL | Status: DC
  Administered 2016-10-07 – 2016-10-09 (×4): 0.4 mg via ORAL
  Filled 2016-10-06 (×4): qty 1

## 2016-10-06 MED ORDER — PRO-STAT SUGAR FREE PO LIQD
30.0000 mL | Freq: Two times a day (BID) | ORAL | Status: DC
  Administered 2016-10-08 – 2016-10-10 (×5): 30 mL via ORAL
  Filled 2016-10-06 (×5): qty 30

## 2016-10-06 MED ORDER — ACETAMINOPHEN 325 MG PO TABS: 650.0000 mg | ORAL_TABLET | Freq: Four times a day (QID) | ORAL | Status: DC | PRN

## 2016-10-06 MED ORDER — INSULIN GLARGINE 100 UNIT/ML ~~LOC~~ SOLN
10.0000 [IU] | Freq: Every day | SUBCUTANEOUS | Status: DC
  Administered 2016-10-08 – 2016-10-09 (×3): 10 [IU] via SUBCUTANEOUS
  Filled 2016-10-06 (×3): qty 0.1

## 2016-10-06 NOTE — Progress Notes (Signed)
Pharmacy Antibiotic Note  [name] is a 83 y.o. male admitted on 10/06/2016 with wound infection/ Osteomyelitis.   Pharmacy has been consulted for Vancomycin and Zosyn Dosing. CBC and culture pending. Temp normal.  MRI shows osteomyelitis of 2nd, 3rd and 4th metatarsal and 3rd and 4th phalanges of 3rd and 4th toe.  WBC 8.5, Scr 2.09.   Plan: Received Loading dose of Vancomycin 2000 mg IV X1, Maintenance Vanc: 1000 mg IV every 24 hours  Received loading dose Zosyn 3.375g IV X1 over 30 minutes. Maintenance Zosyn:  3.375 gm every 8 hours (4 hour infusion) Monitor for Scr, c/s, clinical resolution. F/u de-escalation plan/LOT, vancomycin trough as indicated   Weight: 198 lb 10.2 oz (90.1 kg)  Temp (24hrs), Avg:97.9 F (36.6 C), Min:97.6 F (36.4 C), Max:98.2 F (36.8 C)   Recent Labs Lab 10/06/16 1658  WBC 8.5  CREATININE 2.09*    Estimated Creatinine Clearance: 28.5 mL/min (A) (by C-G formula based on SCr of 2.09 mg/dL (H)).    No Known Allergies   Thank you for allowing pharmacy to be a part of this patient's care.  Jerrye Noble, PharmD Candidate  10/06/2016 8:45 PM

## 2016-10-06 NOTE — ED Provider Notes (Signed)
[facility] DEPT Provider Note   CSN: [id number] Arrival date & time: 10/06/16  1635     History   Chief Complaint Chief Complaint  Patient presents with  . Wound Infection    HPI [name] is a 83 y.o. male.  HPI    Patient is a 83 year old male with past medical history significant for CHF CAD status post CABG hypertension diabetes,history of DVT presenting today with left foot infection. Patient sent here from nursing home after MRI completed of the left foot. MRI showed extensive abscess with osteomyelitis.  Patient has no fevers, systemically appears well otherwise.   Past Medical History:  Diagnosis Date  . BPH (benign prostatic hyperplasia)   . CAD (coronary artery disease)    Multivessel status post CABG 2011 - LIMA to LAD, SVG to diagonal, SVG to OM, SVG to PDA  . Cellulitis    12/15  . Chronic back pain   . Chronic diastolic CHF (congestive heart failure) ([place])   . CKD (chronic kidney disease), stage III ([place])   . COPD (chronic obstructive pulmonary disease) ([place])   . Essential hypertension   . Gastric mass    EGD 9/15  . GERD (gastroesophageal reflux disease)   . History of DVT (deep vein thrombosis)    Postphlebitic syndrome  . History of kidney stones   . HOH (hard of hearing)   . Hx of CABG   . Hyperlipidemia   . Persistent atrial fibrillation ([place])    a. s/p DCCV 03/2016.  [name] Sleep apnea    Stop Bang score of 5  . Type 2 diabetes mellitus [facility])     Patient Active Problem List   Diagnosis Date Noted  . CHF exacerbation ([place]) 09/24/2016  . Cellulitis and abscess of toe of left foot   . HCAP (healthcare-associated pneumonia) 09/09/2016  . COPD (chronic obstructive pulmonary disease) ([place]) 09/09/2016  . CAD (coronary artery disease) 09/09/2016  . Hyperlipidemia 09/09/2016  . Diabetic foot infection ([place]) 09/01/2016  . Type 2 diabetes mellitus ([place]) 07/24/2016  . Pressure injury of skin 06/11/2016  . Atrial fibrillation with normal  ventricular rate ([place]) 06/10/2016  . Esophageal stricture 06/09/2016  . CAD in native artery 06/08/2016  . CKD (chronic kidney disease), stage III ([place]) 06/08/2016  . Chronic diastolic CHF (congestive heart failure) ([place]) 03/19/2016  . CAP (community acquired pneumonia) 03/19/2016  . Abnormal weight loss   . Thrush, oral 12/18/2013  . Chest pain at rest 12/18/2013  . Leukocytosis 12/18/2013  . Diabetes ([place]) 12/18/2013  . Chronic back pain 12/18/2013  . Dysphagia 12/18/2013  . Odynophagia 12/18/2013  . Gastric mass 09/07/2013  . Personal history of colonic polyps 08/05/2013  . Nausea and vomiting 08/01/2013  . UTI (lower urinary tract infection) 12/09/2012  . Lower extremity edema 06/02/2012  . Essential hypertension 06/02/2012  . History of DVT (deep vein thrombosis) 06/02/2012  . Obesity (BMI 30-39.9): BMI 31.3 06/02/2012    Past Surgical History:  Procedure Laterality Date  . CARDIOVERSION N/A 03/20/2016   Procedure: CARDIOVERSION;  Surgeon: Arnoldo Lenis, MD;  Location[facility];  Service: Endoscopy;  Laterality: N/A;  . CATARACT EXTRACTION W/PHACO Left 02/07/2016   Procedure: CATARACT EXTRACTION PHACO AND INTRAOCULAR LENS PLACEMENT (IOC);  Surgeon: [name], MD;  Location: [facility] ORS;  Service: Ophthalmology;  Laterality: Left;  CDE:  23.13  . COLONOSCOPY  2004   Dr. Laural Golden: three small polyps at cecum, path unknown, external hemorrhoids  . COLONOSCOPY N/A 08/16/2013  Dr. [name]: incomplete prep. multiple tubular adenomas, multiple biopsies. Needs surveillance in Aug 2016 due to poor prep  . CORONARY ARTERY BYPASS GRAFT     x5  . CORONARY ARTERY BYPASS GRAFT  2011  . CYSTOSCOPY N/A 12/12/2012   Procedure: CYSTOSCOPY FLEXIBLE;  Surgeon: Marissa Nestle, MD;  Location: [facility] ORS;  Service: Urology;  Laterality: N/A;  . ESOPHAGEAL DILATION N/A 09/14/2016   Procedure: ESOPHAGEAL DILATION;  Surgeon: Daneil Dolin, MD;  Location: [facility];  Service: Endoscopy;   Laterality: N/A;  . ESOPHAGOGASTRODUODENOSCOPY N/A 08/16/2013   Dr. [name]: normal esophagus s/p Maloney dilation, gastric erosions, submucosal gastric mass vs extrinsic mass  . ESOPHAGOGASTRODUODENOSCOPY (EGD) WITH PROPOFOL N/A 09/14/2016   Procedure: ESOPHAGOGASTRODUODENOSCOPY (EGD) WITH PROPOFOL;  Surgeon: Daneil Dolin, MD;  [facility];  Service: Endoscopy;  Laterality: N/A;  . EUS N/A 09/07/2013   Dr. 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 INCISION AND DRAINAGE ABSCESS N/A 12/20/2013   Procedure: INCISION AND DRAINAGE ABSCESS NECK;  Surgeon: Jamesetta So, MD;  Location: [facility] OR[facility];  Service: General;  Laterality: N/A;  . INCISION AND DRAINAGE ABSCESS Left 09/04/2016   Procedure: INCISION AND DRAINAGE ABSCESS;  Surgeon: Aviva Signs, MD;  Location: [facility] ORS;  Service: General;  Laterality: Left;  [name] MALONEY DILATION N/A 08/16/2013   Procedure: Venia Minks DILATION;  Surgeon: Daneil Dolin, MD;  Location[facility];  Service: Endoscopy;  Laterality: N/A;       Home Medications    Prior to Admission medications   Medication Sig Start Date End Date Taking? Authorizing Provider  acetaminophen (TYLENOL) 325 MG tablet Take 650 mg by mouth every 6 (six) hours as needed.    [provider]  Amino Acids-Protein Hydrolys (FEEDING SUPPLEMENT, PRO-STAT SUGAR FREE 64,) LIQD Take 30 mLs by mouth 2 (two) times daily between meals.    [provider]  amLODipine (NORVASC) 10 MG tablet Take 1 tablet (10 mg total) by mouth daily. 09/27/16   [name], MD  antiseptic oral rinse (BIOTENE) LIQD 15 mLs by Mouth Rinse route 4 (four) times daily.    [provider]  apixaban (ELIQUIS) 5 MG TABS tablet Take 5 mg by mouth 2 (two) times daily.    [provider]  cloNIDine (CATAPRES) 0.1 MG tablet Take 0.1 mg by mouth 2 (two) times daily.    [provider]  docusate sodium (COLACE) 100 MG capsule Take 100 mg by mouth 2 (two) times  daily.    [provider]  doxycycline (VIBRAMYCIN) 100 MG capsule Take 100 mg by mouth 2 (two) times daily.    [provider]  Ferrous Sulfate (IRON) 325 (65 Fe) MG TABS Give 1 tablet by mouth once a day    [provider]  FLUoxetine (PROZAC) 20 MG/5ML solution Take 5 mLs (20 mg total) by mouth daily. 09/09/16   [name], MD  furosemide (LASIX) 20 MG tablet Take 40 mg by mouth 2 (two) times daily.     [provider]  GLUCERNA (GLUCERNA) LIQD Take 237 mLs by mouth 2 (two) times daily between meals.    [provider]  insulin glargine (LANTUS) 100 UNIT/ML injection Inject 0.3 mLs (30 Units total) into the skin at bedtime. 09/08/16   [name], MD  ipratropium-albuterol (DUONEB) 0.5-2.5 (3) MG/3ML SOLN Take 3 mLs by nebulization every 6 (six) hours as needed.    [provider]  irbesartan (AVAPRO) 75  MG tablet Take 1 tablet (75 mg total) by mouth daily. 09/27/16   [name], MD  LORazepam (ATIVAN) 0.5 MG tablet Take 1 tablet (0.5 mg total) by mouth 4 (four) times daily. 09/26/16   [name], MD  metoprolol succinate (TOPROL-XL) 25 MG 24 hr tablet Take 3 tablets (75 mg total) by mouth daily. Take with or immediately following a meal. 06/16/16   [name], MD  nitroGLYCERIN (NITROSTAT) 0.4 MG SL tablet Place 1 tablet (0.4 mg total) under the tongue every 5 (five) minutes as needed for chest pain. 12/22/13   [name], MD  oxyCODONE (ROXICODONE) 15 MG immediate release tablet Take 15 mg by mouth every 8 (eight) hours as needed for pain.     [provider]  pantoprazole (PROTONIX) 40 MG tablet Take 1 tablet (40 mg total) by mouth daily. 09/16/16   [name], MD  potassium chloride SA (K-DUR,KLOR-CON) 20 MEQ tablet Take 1 tablet (20 mEq total) by mouth daily. 09/26/16   [name], MD  Probiotic Product (RISA-BID PROBIOTIC) TABS Take 1 tablet by mouth twice a day    [provider]  rosuvastatin (CRESTOR) 10 MG tablet Take 10 mg by mouth daily.    [provider]  tamsulosin (FLOMAX) 0.4 MG CAPS capsule Take 1 capsule (0.4 mg total) by mouth daily. 09/27/16   [name], MD    Family History Family History  Problem Relation Age of Onset  . Colon cancer Son 61       deceased    Social History Social History  Substance Use Topics  . Smoking status: Former Smoker    Packs/day: 1.00    Years: 11.00    Quit date: 01/05/1956  . Smokeless tobacco: Never Used  . Alcohol use No     Allergies   Patient has no known allergies.   Review of Systems Review of Systems  Constitutional: Negative for activity change, fatigue and fever.  Respiratory: Negative for shortness of breath.   Cardiovascular: Negative for chest pain.  Gastrointestinal: Negative for abdominal pain.     Physical Exam Updated Vital Signs BP 134/81 (BP Location: Right Arm)   Pulse 68   Temp 98.2 F (36.8 C) (Oral)   Resp 15   Wt 90.1 kg (198 lb 10.2 oz)   SpO2 100%   BMI 27.70 kg/m   Physical Exam  Constitutional: He appears well-nourished.  HENT:  Head: Normocephalic.  On home 2L  Eyes: Conjunctivae are normal.  Cardiovascular: Normal rate.   Pulmonary/Chest: Effort normal.  Abdominal: Soft. He exhibits no distension. There is no tenderness.  Musculoskeletal:  L foot with erythema, warmth.   Neurological: No cranial nerve deficit. Coordination normal.  Oreitned to year and place.   Skin: Skin is warm and dry. He is not diaphoretic.  Psychiatric: He has a normal mood and affect. His behavior is normal.     ED Treatments / Results  Labs (all labs ordered are listed, but only abnormal results are displayed) Labs Reviewed  COMPREHENSIVE METABOLIC PANEL  CBC WITH DIFFERENTIAL/PLATELET    EKG  EKG Interpretation None       Radiology Dg Op Swallowing Func-medicare/speech Path  Result Date: 10/06/2016 CLINICAL DATA:  Other dysphagia  EXAM: MODIFIED BARIUM SWALLOW TECHNIQUE: Different consistencies of barium were administered orally to the patient by the Speech Pathologist. Imaging of the pharynx was performed in the lateral projection. FLUOROSCOPY TIME:  Fluoroscopy Time:  2 minutes Radiation Exposure Index (if provided by the  fluoroscopic device): 16.2 mGy Number of Acquired Spot Images: 0 COMPARISON:  None. FINDINGS: Barium meal ranging from thin to solid was provided to the patient. In general, there was good airway protection and coordination. With thin liquid there was an episode of laryngeal penetration, nearly to cord level, that was seen in conjunction with pill swallowing. Aspiration was never seen. Mild spillover was seen with thin consistency. No notable stasis. Small focus of barium persisted along the posterior wall at the esophageal verge, likely a small fold above the cricopharyngeus. No true or significant diverticulum. IMPRESSION: Mildly abnormal swelling function study. Please refer to the Speech Pathologists report for complete details and recommendations. Electronically Signed   By: Monte Fantasia M.D.   On: 10/06/2016 14:10   Mr Foot Left Wo Contrast  Result Date: 10/06/2016 CLINICAL DATA:  Diabetic foot ulcer. EXAM: MRI OF THE LEFT FOOT WITHOUT CONTRAST TECHNIQUE: Multiplanar, multisequence MR imaging of the left forefoot was performed. No intravenous contrast was administered. COMPARISON:  Radiographs dated 09/01/2016 FINDINGS: Bones/Joint/Cartilage There is abnormal edema in the shafts and heads of the second, third and fourth metatarsals consistent with osteomyelitis. There is also abnormal edema in the bases of the proximal phalanges of the third and fourth toes consistent with osteomyelitis. There is gas in the soft tissues between the heads of the third and fourth metatarsals. Soft tissues There is a deep soft tissue ulceration on the plantar aspect of the foot at the base of the fourth toe. There is no  underlying abscess that extends between the heads of the third and fourth metatarsals into the dorsal aspect of the forefoot, measuring 80 x 45 x 17 mm. IMPRESSION: Extensive soft tissue abscess extending from the plantar aspect of the foot through the web space between the third and fourth metatarsal heads into the dorsum of the foot where the majority of the abscess resides. Osteomyelitis of the second, third and fourth metatarsals and of the proximal phalanges of the third and fourth toes. Electronically Signed   By: Lorriane Shire M.D.   On: 10/06/2016 09:01    Procedures Procedures (including critical care time)  Medications Ordered in ED Medications - No data to display   Initial Impression / Assessment and Plan / ED Course  I have reviewed the triage vital signs and the nursing notes.  Pertinent labs & imaging results that were available during my care of the patient were reviewed by me and considered in my medical decision making (see chart for details).     Patient is a 83 year old male with past medical history significant for CHF CAD status post CABG hypertension diabetes,history of DVT presenting today with left foot infection. Patient sent here from nursing home after MRI completed of the left foot. MRI showed extensive abscess with osteomyelitis.  Patient has no fevers, systemically appears well otherwise.  6:47 PM Patient sent here because of abnormal MRI of the left foot. We'll touch base with orthopedic surgery and admit for IV antibiotic.  7:08 PM DIscussed with Lorin [facility]cy, will start broad spectrum abx and admit to medicine, NPO after midnight.    Final Clinical Impressions(s) / ED Diagnoses   Final diagnoses:  None    New Prescriptions New Prescriptions   No medications on file     Macarthur Critchley, MD 10/06/16 1908
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
## 2016-10-06 NOTE — ED Triage Notes (Signed)
Pt arrived via [place] from [facility]. Pt reports stepping on bolt 4 weeks ago and is being sent for evaluation of left foot with possible infection. EMS vitals 146/67, RR 16, HR 70, O2 99% on [place] 2L.

## 2016-10-06 NOTE — H&P (Signed)
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 Lorriane Shire M.D.   On: 10/06/2016 09:01    EKG: Independently reviewed.  Assessment/Plan Principal Problem:   Diabetic foot infection ([place]) Active Problems:   Essential hypertension   Diabetes ([place])   Acute renal failure superimposed on stage 3 chronic kidney disease (HCC)   Atrial fibrillation with normal ventricular rate (HCC)   Cellulitis and abscess of foot   Osteomyelitis of left foot (HCC)   Acute osteomyelitis of left foot ([place])    1.  Cellulitis, abscess, and osteomyelitis due to diabetic ulcer of left foot - 1. Cx demonstrate Enterococcus (amp sensitive), and K. Oxytoca (zosyn sensitive).  Will therefore just leave him on zosyn and DC the vanc. 2. Dr. Lorin Mercy req patient be kept NPO after MN 3. Diabetic foot ulcer pathway 2. AKI on CKD stage 3 - 1. Hold Lasix, hold ARB 2. IVF: NS at 75 cc/hr 3. Repeat BMP in AM 3. HTN - continue home meds other than lasix and ARB 4. DM2 - 1. Will do Lantus 10 QHS 2. And sensitive SSI Q4H while NPO 5. A.Fib - 1. Continue  metoprolol 2. Holding eliquis 6. Chronic CHF - 1. Hold lasix 2. Watch for fluid overload  DVT prophylaxis: Holding Eliquis, SCDs Code Status: Full Family Communication: Friend at bedside Disposition Plan: SNF after admit Consults called: Dr. Lorin Mercy called by EDP see above Admission status: Admit to inpatient   Etta Quill DO [facility] Pager [phone]  If 7AM-7PM, please contact day team taking care of patient [email or web address] Password [email or web address]  10/06/2016, 9:51 PM

## 2016-10-06 NOTE — Clinical Social Work Note (Signed)
CSW acknowledges consult for SW, Pt having issues getting medications, referred to [facility] for assistance.  Please consult SW if any social needs arise.  Benjy Kana B. Joline Maxcy Clinical Social Work Dept Weekend Social Worker [phone] 10:54 PM

## 2016-10-06 NOTE — Progress Notes (Signed)
Location:   [place] Room Number: 129/P Place of Service:  SNF (31) Provider:  Jeannine Kitten, MD  Patient Care Team: [name], MD as PCP - General (Internal Medicine) [name], MD (Internal Medicine) [name] [name], MD as Consulting Physician (Gastroenterology)  Extended Emergency Contact Information Primary Emergency Contact: Thamas Jaegers, [place] [address] [place] Phone: [phone] Mobile Phone: [phone] Relation: Daughter Secondary Emergency Contact: Eugene Gavia [place] of [place] Phone: [phone] Relation: Brother  Code Status:  Full Code Goals of care: Advanced Directive information Advanced Directives 10/06/2016  Does Patient Have a Medical Advance Directive? Yes  Type of Advance Directive (No Data)  Does patient want to make changes to medical advance directive? No - Patient declined  Copy of [place] in Chart? -  Would patient like information on creating a medical advance directive? No - Patient declined     Chief Complaint  Patient presents with  . Acute Visit    F/U Wound Care    HPI:  Pt is a 83 y.o. male seen today for an acute visit for MRI of his wound. Patient has h/o COPD on ? PRN  oxygen at home, Hypertensin, Diabetes Mellitus Type 2, Chronic Kidney disease, Hyperlipidemia, CAD s/p CABG in 2011, BPH, Chronic Diastolic failure, with EF of 60%, Chronic Atrial Fibrillation on Chronic Anticoagulation   Patient was admitted in 08/18 for sepsis after he had Metal piece go through his shoe and entered his Left foot. MRI at that time was negative for any Osteomyelitis. Patient was discharged on 1 week of IV antibiotics to SNF.  But for Past few days patient was again seen to have some redness in and around his Dorsal foot and Planter near his Metatarsal. His foot was also swollen.He was started on Doxycyline.  He was  seen by Wound care Clinic and had I&D done by Dr Dellia Nims. He also had  repeat MRI ordered. Patient had MRI done today and it shows Extensive soft tissue abscess  Osteomyelitis of the second, third and fourth metatarsals and of the proximal phalanges of the third and fourth toes. Patient continues to have no fever ,Chills or pain in that leg.  Patient also has been treated for Diastolic CHF with Lasix.He also has been seen by Gertie Fey in hospital for Dysphagia and had EGD with Dilatation.   Past Medical History:  Diagnosis Date  . BPH (benign prostatic hyperplasia)   . CAD (coronary artery disease)    Multivessel status post CABG 2011 - LIMA to LAD, SVG to diagonal, SVG to OM, SVG to PDA  . Cellulitis    12/15  . Chronic back pain   . Chronic diastolic CHF (congestive heart failure) ([place])   . CKD (chronic kidney disease), stage III ([place])   . COPD (chronic obstructive pulmonary disease) ([place])   . Essential hypertension   . Gastric mass    EGD 9/15  . GERD (gastroesophageal reflux disease)   . History of DVT (deep vein thrombosis)    Postphlebitic syndrome  . History of kidney stones   . HOH (hard of hearing)   . Hx of CABG   . Hyperlipidemia   . Persistent atrial fibrillation ([place])    a. s/p DCCV 03/2016.  [name] Sleep apnea    Stop Bang score of 5  . Type 2 diabetes mellitus ([place])    Past Surgical History:  Procedure Laterality Date  . CARDIOVERSION N/A 03/20/2016   Procedure: CARDIOVERSION;  Surgeon: Arnoldo Lenis, MD;  [facility];  Service: Endoscopy;  Laterality: N/A;  . CATARACT EXTRACTION W/PHACO Left 02/07/2016   Procedure: CATARACT EXTRACTION PHACO AND INTRAOCULAR LENS PLACEMENT (IOC);  Surgeon: [name], MD;  Location: [facility] ORS;  Service: Ophthalmology;  Laterality: Left;  CDE:  23.13  . COLONOSCOPY  2004   Dr. Laural Golden: three small polyps at cecum, path unknown, external hemorrhoids  . COLONOSCOPY N/A 08/16/2013   Dr. [name]: incomplete prep. multiple  tubular adenomas, multiple biopsies. Needs surveillance in Aug 2016 due to poor prep  . CORONARY ARTERY BYPASS GRAFT     x5  . CORONARY ARTERY BYPASS GRAFT  2011  . CYSTOSCOPY N/A 12/12/2012   Procedure: CYSTOSCOPY FLEXIBLE;  Surgeon: Marissa Nestle, MD;  Location: [facility] OR[facility];  Service: Urology;  Laterality: N/A;  . ESOPHAGEAL DILATION N/A 09/14/2016   Procedure: ESOPHAGEAL DILATION;  Surgeon: Daneil Dolin, MD;  Location[facility];  Service: Endoscopy;  Laterality: N/A;  . ESOPHAGOGASTRODUODENOSCOPY N/A 08/16/2013   Dr. [name]: normal esophagus s/p Maloney dilation, gastric erosions, submucosal gastric mass vs extrinsic mass  . ESOPHAGOGASTRODUODENOSCOPY (EGD) WITH PROPOFOL N/A 09/14/2016   Procedure: ESOPHAGOGASTRODUODENOSCOPY (EGD) WITH PROPOFOL;  Surgeon: Daneil Dolin, MD;  [facility];  Service: Endoscopy;  Laterality: N/A;  . EUS N/A 09/07/2013   Dr. 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 INCISION AND DRAINAGE ABSCESS N/A 12/20/2013   Procedure: INCISION AND DRAINAGE ABSCESS NECK;  Surgeon: Jamesetta So, MD;  Location: [facility];  Service: General;  Laterality: N/A;  . INCISION AND DRAINAGE ABSCESS Left 09/04/2016   Procedure: INCISION AND DRAINAGE ABSCESS;  Surgeon: Aviva Signs, MD;  Location: [facility];  Service: General;  Laterality: Left;  [name] MALONEY DILATION N/A 08/16/2013   Procedure: Venia Minks DILATION;  Surgeon: Daneil Dolin, MD;  Location: [facility];  Service: Endoscopy;  Laterality: N/A;    No Known Allergies  Outpatient Encounter Prescriptions as of 10/06/2016  Medication Sig  . acetaminophen (TYLENOL) 325 MG tablet Take 650 mg by mouth every 6 (six) hours as needed.  . Amino Acids-Protein Hydrolys (FEEDING SUPPLEMENT, PRO-STAT SUGAR FREE 64,) LIQD Take 30 mLs by mouth 2 (two) times daily between meals.  [name] amLODipine (NORVASC) 10 MG tablet Take 1 tablet (10 mg total) by mouth daily.  [name] antiseptic oral rinse (BIOTENE) LIQD 15 mLs by  Mouth Rinse route 4 (four) times daily.  [name] apixaban (ELIQUIS) 5 MG TABS tablet Take 5 mg by mouth 2 (two) times daily.  . cloNIDine (CATAPRES) 0.1 MG tablet Take 0.1 mg by mouth 2 (two) times daily.  [name] docusate sodium (COLACE) 100 MG capsule Take 100 mg by mouth 2 (two) times daily.  [name] doxycycline (VIBRAMYCIN) 100 MG capsule Take 100 mg by mouth 2 (two) times daily.  . Ferrous Sulfate (IRON) 325 (65 Fe) MG TABS Give 1 tablet by mouth once a day  . FLUoxetine (PROZAC) 20 MG/5ML solution Take 5 mLs (20 mg total) by mouth daily.  . furosemide (LASIX) 20 MG tablet Take 40 mg by mouth 2 (two) times daily.   [name] GLUCERNA (GLUCERNA) LIQD Take 237 mLs by mouth 2 (two) times daily between meals.  . insulin glargine (LANTUS) 100 UNIT/ML injection Inject 0.3 mLs (30 Units total) into the skin at bedtime.  [name] ipratropium-albuterol (DUONEB) 0.5-2.5 (3) MG/3ML SOLN Take 3 mLs by  nebulization every 6 (six) hours as needed.  . irbesartan (AVAPRO) 75 MG tablet Take 1 tablet (75 mg total) by mouth daily.  [name] LORazepam (ATIVAN) 0.5 MG tablet Take 1 tablet (0.5 mg total) by mouth 4 (four) times daily.  . metoprolol succinate (TOPROL-XL) 25 MG 24 hr tablet Take 3 tablets (75 mg total) by mouth daily. Take with or immediately following a meal.  . nitroGLYCERIN (NITROSTAT) 0.4 MG SL tablet Place 1 tablet (0.4 mg total) under the tongue every 5 (five) minutes as needed for chest pain.  [name] oxyCODONE (ROXICODONE) 15 MG immediate release tablet Take 15 mg by mouth every 8 (eight) hours as needed for pain.   . pantoprazole (PROTONIX) 40 MG tablet Take 1 tablet (40 mg total) by mouth daily.  . potassium chloride SA (K-DUR,KLOR-CON) 20 MEQ tablet Take 1 tablet (20 mEq total) by mouth daily.  . Probiotic Product (RISA-BID PROBIOTIC) TABS Take 1 tablet by mouth twice a day  . rosuvastatin (CRESTOR) 10 MG tablet Take 10 mg by mouth daily.  . tamsulosin (FLOMAX) 0.4 MG CAPS capsule Take 1 capsule (0.4 mg total) by mouth daily.   No  facility-administered encounter medications on file as of 10/06/2016.      Review of Systems  Unable to perform ROS: Other    Immunization History  Administered Date(s) Administered  . Influenza, High Dose Seasonal PF 09/25/2016  . Influenza,inj,Quad PF,6+ Mos 12/22/2013  . Tdap 08/20/2016   Pertinent  Health Maintenance Due  Topic Date Due  . FOOT EXAM  10/16/2016 (Originally 08/07/1941)  . OPHTHALMOLOGY EXAM  10/16/2016 (Originally 08/07/1941)  . URINE MICROALBUMIN  10/16/2016 (Originally 08/07/1941)  . PNA vac Low Risk Adult (1 of 2 - PCV13) 12/05/2016 (Originally 08/07/1998)  . HEMOGLOBIN A1C  03/24/2017  . INFLUENZA VACCINE  Completed   No flowsheet data found. Functional Status Survey:    Vitals:   10/06/16 1017  BP: 129/77  Pulse: (!) 109  Resp: 20  Temp: 97.6 F (36.4 C)  TempSrc: Oral   There is no height or weight on file to calculate BMI. Physical Exam  Constitutional: He is oriented to person, place, and time. He appears well-developed.  HENT:  Head: Normocephalic.  Mouth/Throat: Oropharynx is clear and moist.  Eyes: Pupils are equal, round, and reactive to light.  Neck: Neck supple.  Cardiovascular: Normal rate and normal heart sounds.   Pulmonary/Chest: Effort normal and breath sounds normal. No respiratory distress. He has no wheezes.  Abdominal: Soft. Bowel sounds are normal. He exhibits no distension. There is no tenderness. There is no rebound.  Musculoskeletal:  Has significant redness around plantar aspect of Foot and dorsal. Swelling aorund the redness  Neurological: He is alert and oriented to person, place, and time.  Skin: Skin is warm and dry.    Labs reviewed:  Recent Labs  03/21/16 0556  07/25/16 0528  07/27/16 0815  08/25/16 0553 08/26/16 0526  09/25/16 0651 09/26/16 0611 09/28/16 0330  NA 138  < > 145  < > 140  < > 136 135  < > 137 137 134*  K 3.8  < > 4.0  < > 4.2  < > 4.1 4.5  < > 3.9 3.8 4.0  CL 99*  < > 105  < > 101  < > 104  105  < > 94* 96* 92*  CO2 30  < > 31  < > 30  < > 23 22  < > 35* 34* 33*  GLUCOSE  232*  < > 143*  < > 161*  < > 74 160*  < > 150* 123* 109*  BUN 32*  < > 25*  < > 42*  < > 70* 70*  < > 14 18 18   CREATININE 1.79*  < > 1.57*  < > 2.19*  < > 2.42* 2.10*  < > 1.44* 1.57* 1.61*  CALCIUM 9.6  < > 9.5  < > 9.1  < > 9.2 9.1  < > 10.0 9.9 9.9  MG 1.9  --  2.0  --  2.1  --   --   --   --   --   --   --   PHOS  --   --   --   --   --   --  5.3* 5.2*  --   --   --   --   < > = values in this interval not displayed.  Recent Labs  09/01/16 1100 09/10/16 0648 09/24/16 0021  AST 19 16 16   ALT 35 22 13*  ALKPHOS 201* 142* 113  BILITOT 1.1 0.5 0.5  PROT 7.4 5.5* 6.4*  ALBUMIN 3.1* 2.2* 2.8*    Recent Labs  09/17/16 0717 09/22/16 0700 09/24/16 0021 09/24/16 0404 09/26/16 0611 09/28/16 0330  WBC 12.9* 12.6* 10.8* 11.2* 9.8 9.2  NEUTROABS 9.8* 9.8* 7.9*  --   --   --   HGB 9.1* 8.7* 8.6* 8.0* 7.8* 8.6*  HCT 28.7* 27.7* 26.6* 24.9* 26.1* 27.9*  MCV 85.7 85.8 85.0 84.7 89.4 88.6  PLT 369 323 249 252 226 220   Lab Results  Component Value Date   TSH 1.721 09/09/2016   Lab Results  Component Value Date   HGBA1C 8.5 (H) 09/24/2016   Lab Results  Component Value Date   CHOL 114 09/17/2016   HDL 23 (L) 09/17/2016   LDLCALC 65 09/17/2016   TRIG 130 09/17/2016   CHOLHDL 5.0 09/17/2016    Significant Diagnostic Results in last 30 days:  Dg Chest 2 View  Result Date: 09/24/2016 CLINICAL DATA:  Shortness of breath and chest pain EXAM: CHEST  2 VIEW COMPARISON:  09/12/2016 FINDINGS: Post sternotomy changes. Small bilateral pleural effusions. Mild cardiomegaly with diffuse increased interstitial opacities suspicious for mild pulmonary edema. Right upper extremity catheter tip overlies the distal SVC. Atherosclerotic calcifications. No pneumothorax. IMPRESSION: Cardiomegaly with small bilateral pleural effusions. Diffuse increased interstitial opacity, suspicious for mild pulmonary edema.  Electronically Signed   By: Donavan Foil M.D.   On: 09/24/2016 01:14   Dg Chest 2 View  Result Date: 09/09/2016 CLINICAL DATA:  Central chest pain on off for 4 weeks. History of CHF, diabetes, hypertension, coronary artery disease, gastroesophageal reflux disease, DVT, COPD, atrial fibrillation, chronic kidney disease, coronary artery bypass. EXAM: CHEST  2 VIEW COMPARISON:  09/01/2016 FINDINGS: Postoperative changes in the mediastinum. Cardiac enlargement without significant vascular congestion. Increasing interstitial pattern to the lung bases particularly on the left may indicate developing interstitial edema or pneumonia. Small left pleural effusion has developed since previous study. Atelectasis or infiltration in the left lung base is also developing. Calcification of the aorta. No pneumothorax. Right PICC line with tip over the low SVC region. Degenerative changes in the spine and shoulders. IMPRESSION: Cardiac enlargement. Developing consolidation or atelectasis in the left lung base with new small left pleural effusion and increasing bibasilar interstitial changes. Electronically Signed   By: Lucienne Capers M.D.   On: 09/09/2016 21:08   Ct Chest W  Contrast  Result Date: 09/11/2016 CLINICAL DATA:  83 year old male with intermittent chest pain. Unresolved pneumonia. EXAM: CT CHEST WITH CONTRAST TECHNIQUE: Multidetector CT imaging of the chest was performed during intravenous contrast administration. CONTRAST:  49mL ISOVUE-300 IOPAMIDOL (ISOVUE-300) INJECTION 61% COMPARISON:  Chest radiographs 09/09/2016 and earlier. Chest CTs 06/10/2016 and 01/11/2015 FINDINGS: Cardiovascular: Prior CABG. Stable cardiomegaly. No pericardial effusion. Calcified aortic atherosclerosis. Mediastinum/Nodes: Stable small mediastinal lymph nodes. No lymphadenopathy. Stable and negative for age thyroid. Lungs/Pleura: Major airways are patent, with no residual retained secretions which were visible in the trachea in June.  Layering small to moderate bilateral pleural effusions are progressed on the left and new on the right since June. Associated left greater than right confluent lower lobe opacity has also progressed, and there are associated air bronchograms, but the affected lung appears to be enhancing, favoring atelectasis over consolidation. Mild additional superimposed bilateral dependent and compressive atelectasis. No superimposed pulmonary nodularity. Upper Abdomen: Stable visualized upper abdominal viscera with no significant abnormality. Musculoskeletal: Osteopenia. Prior sternotomy. No thoracic vertebral fracture or acute osseous finding identified. IMPRESSION: 1. New/increased bilateral layering pleural effusions since June, small to moderate and greater on the left. 2. Associated increased and confluent bilateral lower lobe opacity, greater on the left, with air bronchograms. Burtis Junes this is atelectasis, although bilateral lower lobe pneumonia is difficult to exclude. 3. Otherwise stable chest including cardiomegaly, Aortic Atherosclerosis (ICD10-I70.0), prior CABG. Electronically Signed   By: Genevie Ann M.D.   On: 09/11/2016 10:10   Mr Foot Left Wo Contrast  Result Date: 10/06/2016 CLINICAL DATA:  Diabetic foot ulcer. EXAM: MRI OF THE LEFT FOOT WITHOUT CONTRAST TECHNIQUE: Multiplanar, multisequence MR imaging of the left forefoot was performed. No intravenous contrast was administered. COMPARISON:  Radiographs dated 09/01/2016 FINDINGS: Bones/Joint/Cartilage There is abnormal edema in the shafts and heads of the second, third and fourth metatarsals consistent with osteomyelitis. There is also abnormal edema in the bases of the proximal phalanges of the third and fourth toes consistent with osteomyelitis. There is gas in the soft tissues between the heads of the third and fourth metatarsals. Soft tissues There is a deep soft tissue ulceration on the plantar aspect of the foot at the base of the fourth toe. There is no  underlying abscess that extends between the heads of the third and fourth metatarsals into the dorsal aspect of the forefoot, measuring 80 x 45 x 17 mm. IMPRESSION: Extensive soft tissue abscess extending from the plantar aspect of the foot through the web space between the third and fourth metatarsal heads into the dorsum of the foot where the majority of the abscess resides. Osteomyelitis of the second, third and fourth metatarsals and of the proximal phalanges of the third and fourth toes. Electronically Signed   By: Lorriane Shire M.D.   On: 10/06/2016 09:01   US Venous Img Upper Uni Right  Result Date: 09/13/2016 CLINICAL DATA:  Right upper extremity pain with hand edema for 2 weeks. Right-sided PICC line in place. EXAM: RIGHT UPPER EXTREMITY VENOUS DOPPLER ULTRASOUND TECHNIQUE: Gray-scale sonography with graded compression, as well as color Doppler and duplex ultrasound were performed to evaluate the upper extremity deep venous system from the level of the subclavian vein and including the jugular, axillary, basilic, radial, ulnar and upper cephalic vein. Spectral Doppler was utilized to evaluate flow at rest and with distal augmentation maneuvers. COMPARISON:  None. FINDINGS: Contralateral Subclavian Vein: Respiratory phasicity is normal and symmetric with the symptomatic side. No evidence of thrombus. Normal compressibility.  Internal Jugular Vein: No evidence of thrombus. Normal compressibility, respiratory phasicity and response to augmentation. Subclavian Vein: No evidence of thrombus. Normal compressibility, respiratory phasicity and response to augmentation. Axillary Vein: No evidence of thrombus. Normal compressibility, respiratory phasicity and response to augmentation. Cephalic Vein: No evidence of thrombus. Normal compressibility, respiratory phasicity and response to augmentation. Basilic Vein: No evidence of thrombus. Normal compressibility, respiratory phasicity and response to augmentation.  Brachial Veins: No evidence of thrombus. Normal compressibility, respiratory phasicity and response to augmentation. Radial Veins: No evidence of thrombus. Normal compressibility, respiratory phasicity and response to augmentation. Ulnar Veins: No evidence of thrombus. Normal compressibility, respiratory phasicity and response to augmentation. Venous Reflux:  None visualized. Other Findings: PICC line in place. No thrombus seen along the course of the PICC line. Ill-defined edema within the subcutaneous soft tissues of the right arm. IMPRESSION: No evidence of DVT within the right upper extremity. Ill-defined edema within the subcutaneous soft tissues. Electronically Signed   By: Franki Cabot M.D.   On: 09/13/2016 13:18   Dg Chest Port 1 View  Result Date: 09/12/2016 CLINICAL DATA:  PICC line placement. EXAM: PORTABLE CHEST 1 VIEW COMPARISON:  Chest x-ray dated 09/09/2016. FINDINGS: Right-sided PICC line in place. Tip appears stable in position at the level of the mid SVC. Heart size and mediastinal contours are stable. Opacity at the left lung base appears stable, likely atelectasis and/or small effusion. No new lung findings. IMPRESSION: PICC line appears adequately positioned at the level of the mid SVC. Electronically Signed   By: Franki Cabot M.D.   On: 09/12/2016 13:04    Assessment/Plan  Abscess of Left foot with Osteomyelitis D/W Dr Dellia Nims in Wound care and with his diabetes and location of Abscess patient would need extensive debridement and IV antibiotics. Have D/W the patein he is ready to go to the hospital for further Evaluation.  Family/ staff Communication:   Labs/tests ordered:

## 2016-10-07 ENCOUNTER — Encounter (HOSPITAL_COMMUNITY): Payer: Self-pay | Admitting: *Deleted

## 2016-10-07 ENCOUNTER — Inpatient Hospital Stay (HOSPITAL_COMMUNITY): Payer: Medicare Other | Admitting: Anesthesiology

## 2016-10-07 ENCOUNTER — Encounter (HOSPITAL_COMMUNITY)
Admission: EM | Disposition: A | Discharge status: Skilled Nursing Facility | Payer: Self-pay | Source: Home / Self Care | Attending: Internal Medicine

## 2016-10-07 ENCOUNTER — Inpatient Hospital Stay (HOSPITAL_COMMUNITY): Payer: Medicare Other

## 2016-10-07 ENCOUNTER — Encounter (HOSPITAL_BASED_OUTPATIENT_CLINIC_OR_DEPARTMENT_OTHER): Payer: Medicare Other

## 2016-10-07 DIAGNOSIS — I4891 Unspecified atrial fibrillation: Secondary | ICD-10-CM

## 2016-10-07 DIAGNOSIS — N183 Chronic kidney disease, stage 3 (moderate): Secondary | ICD-10-CM

## 2016-10-07 DIAGNOSIS — L02612 Cutaneous abscess of left foot: Secondary | ICD-10-CM

## 2016-10-07 DIAGNOSIS — E11628 Type 2 diabetes mellitus with other skin complications: Secondary | ICD-10-CM

## 2016-10-07 DIAGNOSIS — I10 Essential (primary) hypertension: Secondary | ICD-10-CM

## 2016-10-07 DIAGNOSIS — N179 Acute kidney failure, unspecified: Secondary | ICD-10-CM

## 2016-10-07 DIAGNOSIS — M86172 Other acute osteomyelitis, left ankle and foot: Secondary | ICD-10-CM

## 2016-10-07 DIAGNOSIS — L089 Local infection of the skin and subcutaneous tissue, unspecified: Secondary | ICD-10-CM

## 2016-10-07 HISTORY — PX: AMPUTATION: SHX166

## 2016-10-07 LAB — RETICULOCYTES
RBC.: 3.29 MIL/uL — AB (ref 4.22–5.81)
RETIC CT PCT: 0.9 % (ref 0.4–3.1)
Retic Count, Absolute: 29.6 10*3/uL (ref 19.0–186.0)

## 2016-10-07 LAB — BASIC METABOLIC PANEL
Anion gap: 11 (ref 5–15)
BUN: 29 mg/dL — AB (ref 6–20)
CHLORIDE: 95 mmol/L — AB (ref 101–111)
CO2: 30 mmol/L (ref 22–32)
CREATININE: 2.01 mg/dL — AB (ref 0.61–1.24)
Calcium: 9.9 mg/dL (ref 8.9–10.3)
GFR calc Af Amer: 34 mL/min — ABNORMAL LOW (ref >60)
GFR calc non Af Amer: 29 mL/min — ABNORMAL LOW (ref >60)
Glucose, Bld: 176 mg/dL — ABNORMAL HIGH (ref 65–99)
Potassium: 4.1 mmol/L (ref 3.5–5.1)
Sodium: 136 mmol/L (ref 135–145)

## 2016-10-07 LAB — CBC
HEMATOCRIT: 23.4 % — AB (ref 39.0–52.0)
HEMOGLOBIN: 7.4 g/dL — AB (ref 13.0–17.0)
MCH: 27.2 pg (ref 26.0–34.0)
MCHC: 31.6 g/dL (ref 30.0–36.0)
MCV: 86 fL (ref 78.0–100.0)
Platelets: 194 10*3/uL (ref 150–400)
RBC: 2.72 MIL/uL — ABNORMAL LOW (ref 4.22–5.81)
RDW: 14.4 % (ref 11.5–15.5)
WBC: 8.1 10*3/uL (ref 4.0–10.5)

## 2016-10-07 LAB — GLUCOSE, CAPILLARY
GLUCOSE-CAPILLARY: 176 mg/dL — AB (ref 65–99)
GLUCOSE-CAPILLARY: 161 mg/dL — AB (ref 65–99)
GLUCOSE-CAPILLARY: 172 mg/dL — AB (ref 65–99)
GLUCOSE-CAPILLARY: 198 mg/dL — AB (ref 65–99)
Glucose-Capillary: 159 mg/dL — ABNORMAL HIGH (ref 65–99)
Glucose-Capillary: 178 mg/dL — ABNORMAL HIGH (ref 65–99)
Glucose-Capillary: 185 mg/dL — ABNORMAL HIGH (ref 65–99)

## 2016-10-07 LAB — MRSA PCR SCREENING: MRSA BY PCR: NEGATIVE

## 2016-10-07 LAB — AEROBIC/ANAEROBIC CULTURE (SURGICAL/DEEP WOUND)

## 2016-10-07 LAB — TYPE AND SCREEN
ABO/RH(D): O NEG
Antibody Screen: NEGATIVE

## 2016-10-07 LAB — AEROBIC/ANAEROBIC CULTURE W GRAM STAIN (SURGICAL/DEEP WOUND)

## 2016-10-07 LAB — FERRITIN: FERRITIN: 77 ng/mL (ref 24–336)

## 2016-10-07 LAB — IRON AND TIBC
Iron: 24 ug/dL — ABNORMAL LOW (ref 45–182)
SATURATION RATIOS: 8 % — AB (ref 17.9–39.5)
TIBC: 284 ug/dL (ref 250–450)
UIBC: 260 ug/dL

## 2016-10-07 LAB — ABO/RH: ABO/RH(D): O NEG

## 2016-10-07 LAB — FOLATE: Folate: 22.3 ng/mL (ref >5.9)

## 2016-10-07 LAB — VITAMIN B12: VITAMIN B 12: 726 pg/mL (ref 180–914)

## 2016-10-07 LAB — HIV ANTIBODY (ROUTINE TESTING W REFLEX): HIV Screen 4th Generation wRfx: NONREACTIVE

## 2016-10-07 LAB — SEDIMENTATION RATE: Sed Rate: 48 mm/hr — ABNORMAL HIGH (ref 0–16)

## 2016-10-07 SURGERY — AMPUTATION BELOW KNEE
Anesthesia: General | Site: Leg Lower | Laterality: Left

## 2016-10-07 MED ORDER — ACETAMINOPHEN 650 MG RE SUPP: 650.0000 mg | Freq: Four times a day (QID) | RECTAL | Status: DC | PRN

## 2016-10-07 MED ORDER — SODIUM CHLORIDE 0.9 % IV SOLN
INTRAVENOUS | Status: DC
Start: 2016-10-07 — End: 2016-10-07
  Administered 2016-10-07: 17:00:00 via INTRAVENOUS

## 2016-10-07 MED ORDER — SUCCINYLCHOLINE CHLORIDE 200 MG/10ML IV SOSY
PREFILLED_SYRINGE | INTRAVENOUS | Status: AC
  Filled 2016-10-07: qty 10

## 2016-10-07 MED ORDER — LIDOCAINE 2% (20 MG/ML) 5 ML SYRINGE
INTRAMUSCULAR | Status: AC
  Filled 2016-10-07: qty 5

## 2016-10-07 MED ORDER — LORAZEPAM 0.5 MG PO TABS
0.5000 mg | ORAL_TABLET | Freq: Once | ORAL | Status: AC
  Administered 2016-10-07: 0.5 mg via ORAL
  Filled 2016-10-07: qty 1

## 2016-10-07 MED ORDER — ONDANSETRON HCL 4 MG/2ML IJ SOLN: 4.0000 mg | Freq: Four times a day (QID) | INTRAMUSCULAR | Status: DC | PRN

## 2016-10-07 MED ORDER — PROPOFOL 10 MG/ML IV BOLUS
INTRAVENOUS | Status: AC
  Filled 2016-10-07: qty 20

## 2016-10-07 MED ORDER — 0.9 % SODIUM CHLORIDE (POUR BTL) OPTIME
TOPICAL | Status: DC | PRN
  Administered 2016-10-07: 1000 mL

## 2016-10-07 MED ORDER — FENTANYL CITRATE (PF) 100 MCG/2ML IJ SOLN
25.0000 ug | INTRAMUSCULAR | Status: DC | PRN
  Administered 2016-10-07: 25 ug via INTRAVENOUS
  Administered 2016-10-07 (×2): 50 ug via INTRAVENOUS

## 2016-10-07 MED ORDER — FENTANYL CITRATE (PF) 250 MCG/5ML IJ SOLN
INTRAMUSCULAR | Status: AC
  Filled 2016-10-07: qty 5

## 2016-10-07 MED ORDER — MIDAZOLAM HCL 2 MG/2ML IJ SOLN
INTRAMUSCULAR | Status: AC
  Filled 2016-10-07: qty 2

## 2016-10-07 MED ORDER — DIPHENHYDRAMINE HCL 25 MG PO CAPS: 25.0000 mg | ORAL_CAPSULE | ORAL | Status: DC | PRN

## 2016-10-07 MED ORDER — SODIUM CHLORIDE 0.9 % IV SOLN: Freq: Once | INTRAVENOUS | Status: AC

## 2016-10-07 MED ORDER — ONDANSETRON HCL 4 MG/2ML IJ SOLN
INTRAMUSCULAR | Status: DC | PRN
  Administered 2016-10-07: 4 mg via INTRAVENOUS

## 2016-10-07 MED ORDER — FENTANYL CITRATE (PF) 100 MCG/2ML IJ SOLN
50.0000 ug | Freq: Once | INTRAMUSCULAR | Status: AC
  Administered 2016-10-07: 50 ug via INTRAVENOUS

## 2016-10-07 MED ORDER — MEPERIDINE HCL 25 MG/ML IJ SOLN: 6.2500 mg | INTRAMUSCULAR | Status: DC | PRN

## 2016-10-07 MED ORDER — HYDROMORPHONE HCL 1 MG/ML IJ SOLN
1.0000 mg | INTRAMUSCULAR | Status: DC | PRN
  Administered 2016-10-07: 1 mg via INTRAVENOUS
  Filled 2016-10-07: qty 1

## 2016-10-07 MED ORDER — OXYCODONE HCL 5 MG PO TABS
ORAL_TABLET | ORAL | Status: AC
  Filled 2016-10-07: qty 1

## 2016-10-07 MED ORDER — HYDROCERIN EX CREA
TOPICAL_CREAM | Freq: Three times a day (TID) | CUTANEOUS | Status: DC
Start: 2016-10-07 — End: 2016-10-10
  Administered 2016-10-07 – 2016-10-10 (×7): via TOPICAL
  Filled 2016-10-07 (×2): qty 113

## 2016-10-07 MED ORDER — DIPHENHYDRAMINE HCL 25 MG PO CAPS
50.0000 mg | ORAL_CAPSULE | Freq: Once | ORAL | Status: AC
  Administered 2016-10-07: 50 mg via ORAL
  Filled 2016-10-07: qty 2

## 2016-10-07 MED ORDER — SODIUM CHLORIDE 0.9 % IV SOLN
1250.0000 mg | INTRAVENOUS | Status: DC
  Administered 2016-10-07: 1250 mg via INTRAVENOUS
  Filled 2016-10-07: qty 1250

## 2016-10-07 MED ORDER — LIDOCAINE 2% (20 MG/ML) 5 ML SYRINGE
INTRAMUSCULAR | Status: DC | PRN
  Administered 2016-10-07: 20 mg via INTRAVENOUS

## 2016-10-07 MED ORDER — ROCURONIUM BROMIDE 10 MG/ML (PF) SYRINGE
PREFILLED_SYRINGE | INTRAVENOUS | Status: AC
  Filled 2016-10-07: qty 5

## 2016-10-07 MED ORDER — FENTANYL CITRATE (PF) 100 MCG/2ML IJ SOLN
INTRAMUSCULAR | Status: AC
  Administered 2016-10-07: 50 ug via INTRAVENOUS
  Filled 2016-10-07: qty 2

## 2016-10-07 MED ORDER — ONDANSETRON HCL 4 MG/2ML IJ SOLN
INTRAMUSCULAR | Status: AC
  Filled 2016-10-07: qty 2

## 2016-10-07 MED ORDER — FENTANYL CITRATE (PF) 100 MCG/2ML IJ SOLN
INTRAMUSCULAR | Status: AC
Start: 2016-10-07 — End: 2016-10-07
  Administered 2016-10-07: 50 ug via INTRAVENOUS
  Filled 2016-10-07: qty 2

## 2016-10-07 MED ORDER — FENTANYL CITRATE (PF) 100 MCG/2ML IJ SOLN
INTRAMUSCULAR | Status: AC
  Filled 2016-10-07: qty 2

## 2016-10-07 MED ORDER — OXYCODONE HCL 5 MG PO TABS
5.0000 mg | ORAL_TABLET | ORAL | Status: DC | PRN
  Administered 2016-10-07 – 2016-10-10 (×7): 5 mg via ORAL
  Filled 2016-10-07 (×6): qty 1

## 2016-10-07 MED ORDER — LORAZEPAM 0.5 MG PO TABS
0.5000 mg | ORAL_TABLET | Freq: Every evening | ORAL | Status: DC | PRN
  Administered 2016-10-08 (×2): 0.5 mg via ORAL
  Filled 2016-10-07 (×2): qty 1

## 2016-10-07 MED ORDER — ROPIVACAINE HCL 7.5 MG/ML IJ SOLN
INTRAMUSCULAR | Status: DC | PRN
  Administered 2016-10-07 (×2): 20 mL via PERINEURAL

## 2016-10-07 MED ORDER — ADULT MULTIVITAMIN W/MINERALS CH
1.0000 | ORAL_TABLET | Freq: Every day | ORAL | Status: DC
  Administered 2016-10-08 – 2016-10-10 (×3): 1 via ORAL
  Filled 2016-10-07 (×3): qty 1

## 2016-10-07 MED ORDER — ACETAMINOPHEN 325 MG PO TABS: 650.0000 mg | ORAL_TABLET | Freq: Four times a day (QID) | ORAL | Status: DC | PRN

## 2016-10-07 MED ORDER — METOCLOPRAMIDE HCL 5 MG/ML IJ SOLN: 5.0000 mg | Freq: Three times a day (TID) | INTRAMUSCULAR | Status: DC | PRN

## 2016-10-07 MED ORDER — METOCLOPRAMIDE HCL 5 MG PO TABS: 5.0000 mg | ORAL_TABLET | Freq: Three times a day (TID) | ORAL | Status: DC | PRN

## 2016-10-07 MED ORDER — ONDANSETRON HCL 4 MG PO TABS: 4.0000 mg | ORAL_TABLET | Freq: Four times a day (QID) | ORAL | Status: DC | PRN

## 2016-10-07 MED ORDER — FLUOXETINE HCL 20 MG PO CAPS
20.0000 mg | ORAL_CAPSULE | Freq: Every day | ORAL | Status: DC
  Administered 2016-10-07 – 2016-10-10 (×4): 20 mg via ORAL
  Filled 2016-10-07 (×4): qty 1

## 2016-10-07 MED ORDER — PROPOFOL 10 MG/ML IV BOLUS
INTRAVENOUS | Status: DC | PRN
  Administered 2016-10-07: 110 mg via INTRAVENOUS

## 2016-10-07 SURGICAL SUPPLY — 57 items
BANDAGE ACE 4X5 VEL STRL LF (GAUZE/BANDAGES/DRESSINGS) ×3 IMPLANT
BANDAGE ACE 6X5 VEL STRL LF (GAUZE/BANDAGES/DRESSINGS) ×3 IMPLANT
BANDAGE ESMARK 6X9 LF (GAUZE/BANDAGES/DRESSINGS) IMPLANT
BLADE SAW SAG 29X58X.64 (BLADE) ×3 IMPLANT
BNDG COHESIVE 6X5 TAN STRL LF (GAUZE/BANDAGES/DRESSINGS) ×6 IMPLANT
BNDG ESMARK 6X9 LF (GAUZE/BANDAGES/DRESSINGS)
BNDG GAUZE ELAST 4 BULKY (GAUZE/BANDAGES/DRESSINGS) ×3 IMPLANT
CLEANER TIP ELECTROSURG 2X2 (MISCELLANEOUS) ×3 IMPLANT
COVER SURGICAL LIGHT HANDLE (MISCELLANEOUS) ×3 IMPLANT
CUFF TOURNIQUET SINGLE 34IN LL (TOURNIQUET CUFF) ×3 IMPLANT
CUFF TOURNIQUET SINGLE 44IN (TOURNIQUET CUFF) IMPLANT
DRAIN PENROSE 1/2X12 LTX STRL (WOUND CARE) IMPLANT
DRAPE HALF SHEET 40X57 (DRAPES) ×9 IMPLANT
DRAPE INCISE IOBAN 66X45 STRL (DRAPES) ×3 IMPLANT
DRAPE ORTHO SPLIT 77X108 STRL (DRAPES) ×2
DRAPE SURG ORHT 6 SPLT 77X108 (DRAPES) ×1 IMPLANT
DRAPE U-SHAPE 47X51 STRL (DRAPES) ×3 IMPLANT
DRSG PAD ABDOMINAL 8X10 ST (GAUZE/BANDAGES/DRESSINGS) ×3 IMPLANT
DURAPREP 26ML APPLICATOR (WOUND CARE) ×3 IMPLANT
EVACUATOR 1/8 PVC DRAIN (DRAIN) IMPLANT
GAUZE SPONGE 4X4 12PLY STRL (GAUZE/BANDAGES/DRESSINGS) ×3 IMPLANT
GAUZE XEROFORM 5X9 LF (GAUZE/BANDAGES/DRESSINGS) ×3 IMPLANT
GLOVE BIOGEL M 7.0 STRL (GLOVE) ×3 IMPLANT
GLOVE BIOGEL PI IND STRL 7.0 (GLOVE) ×1 IMPLANT
GLOVE BIOGEL PI IND STRL 8 (GLOVE) ×2 IMPLANT
GLOVE BIOGEL PI INDICATOR 7.0 (GLOVE) ×2
GLOVE BIOGEL PI INDICATOR 8 (GLOVE) ×4
GLOVE ORTHO TXT STRL SZ7.5 (GLOVE) ×3 IMPLANT
GOWN STRL REUS W/ TWL LRG LVL3 (GOWN DISPOSABLE) IMPLANT
GOWN STRL REUS W/ TWL XL LVL3 (GOWN DISPOSABLE) ×1 IMPLANT
GOWN STRL REUS W/TWL 2XL LVL3 (GOWN DISPOSABLE) ×3 IMPLANT
GOWN STRL REUS W/TWL LRG LVL3 (GOWN DISPOSABLE)
GOWN STRL REUS W/TWL XL LVL3 (GOWN DISPOSABLE) ×2
IMMOBILIZER KNEE 22 (SOFTGOODS) ×3 IMPLANT
KIT BASIN OR (CUSTOM PROCEDURE TRAY) ×3 IMPLANT
KIT ROOM TURNOVER OR (KITS) ×3 IMPLANT
MANIFOLD NEPTUNE II (INSTRUMENTS) ×3 IMPLANT
NEEDLE MAYO TROCAR (NEEDLE) ×3 IMPLANT
NS IRRIG 1000ML POUR BTL (IV SOLUTION) ×3 IMPLANT
PACK GENERAL/GYN (CUSTOM PROCEDURE TRAY) ×3 IMPLANT
PAD ARMBOARD 7.5X6 YLW CONV (MISCELLANEOUS) ×6 IMPLANT
PADDING CAST COTTON 6X4 STRL (CAST SUPPLIES) ×3 IMPLANT
SPONGE LAP 18X18 X RAY DECT (DISPOSABLE) IMPLANT
STAPLER VISISTAT 35W (STAPLE) ×3 IMPLANT
STOCKINETTE IMPERVIOUS LG (DRAPES) ×3 IMPLANT
SUT ETHILON 2 0 PSLX (SUTURE) ×6 IMPLANT
SUT SILK 3 0 SH CR/8 (SUTURE) ×3 IMPLANT
SUT VIC AB 0 CT1 27 (SUTURE) ×2
SUT VIC AB 0 CT1 27XBRD ANBCTR (SUTURE) ×1 IMPLANT
SUT VIC AB 1 CTX 18 (SUTURE) ×3 IMPLANT
SUT VIC AB 2-0 CT1 27 (SUTURE) ×2
SUT VIC AB 2-0 CT1 TAPERPNT 27 (SUTURE) ×1 IMPLANT
SUT VICRYL 0 TIES 12 18 (SUTURE) ×3 IMPLANT
SUT VICRYL AB 2 0 TIES (SUTURE) IMPLANT
TOWEL OR 17X24 6PK STRL BLUE (TOWEL DISPOSABLE) ×3 IMPLANT
TOWEL OR 17X26 10 PK STRL BLUE (TOWEL DISPOSABLE) ×3 IMPLANT
WATER STERILE IRR 1000ML POUR (IV SOLUTION) ×3 IMPLANT

## 2016-10-07 NOTE — Progress Notes (Signed)
VASCULAR LAB PRELIMINARY  ARTERIAL  ABI completed: Right ABI of 1.12 and left ABI of 1.26 are suggestive of arterial flow within normal limits at rest. Unable to obtain bilateral TBI's due to toe ulcerations.   RIGHT    LEFT    PRESSURE WAVEFORM  PRESSURE WAVEFORM  BRACHIAL 131 Triphasic BRACHIAL 132 Triphasic  DP 127 Monophasic DP 155 Biphasic  PT 148 Biphasic PT 166 Biphasic    RIGHT LEFT  ABI 1.12 1.26    Legrand Como, RVT 10/07/2016, 11:57 AM

## 2016-10-07 NOTE — Anesthesia Procedure Notes (Signed)
Anesthesia Regional Block: Adductor canal block   Pre-Anesthetic Checklist: ,, timeout performed, Correct Patient, Correct Site, Correct Laterality, Correct Procedure, Correct Position, site marked, Risks and benefits discussed,  Surgical consent,  Pre-op evaluation,  At surgeon's request and post-op pain management  Laterality: Left  Prep: chloraprep       Needles:  Injection technique: Single-shot  Needle Type: Echogenic Stimulator Needle     Needle Length: 5cm  Needle Gauge: 22     Additional Needles:   Procedures:, nerve stimulator,,, ultrasound used (permanent image in chart),,,,  Narrative:  Start time: 10/07/2016 5:25 PM End time: 10/07/2016 5:30 PM Injection made incrementally with aspirations every 5 mL.  Performed by: Personally  Anesthesiologist: Lucerito Rosinski  Additional Notes: Functioning IV was confirmed and monitors were applied.  A 65mm 22ga Arrow echogenic stimulator needle was used. Sterile prep and drape,hand hygiene and sterile gloves were used. Ultrasound guidance: relevant anatomy identified, needle position confirmed, local anesthetic spread visualized around nerve(s)., vascular puncture avoided.  Image printed for medical record. Negative aspiration and negative test dose prior to incremental administration of local anesthetic. The patient tolerated the procedure well.

## 2016-10-07 NOTE — Transfer of Care (Signed)
Immediate Anesthesia Transfer of Care Note  Patient: [name]  Procedure(s) Performed: AMPUTATION BELOW KNEE (Left Leg Lower)  Patient Location: PACU  Anesthesia Type:GA combined with regional for post-op pain  Level of Consciousness: sedated, drowsy, patient cooperative and responds to stimulation  Airway & Oxygen Therapy: Patient Spontanous Breathing and Patient connected to nasal cannula oxygen  Post-op Assessment: Report given to RN, Post -op Vital signs reviewed and stable and Patient moving all extremities X 4  Post vital signs: Reviewed and stable  Last Vitals:  Vitals:   10/07/16 1730 10/07/16 1732  BP: (!) 148/55   Pulse: 77 78  Resp: 20 (!) 27  Temp:    SpO2: 93% 94%    Last Pain:  Vitals:   10/07/16 1730  TempSrc:   PainSc: 0-No pain         Complications: No apparent anesthesia complications
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
## 2016-10-07 NOTE — Progress Notes (Signed)
When obtaining consent from the patient, patient states that he has more questions and is unsure of the procedure. Lorin Mercy, MD notified. Lorin Mercy, MD speaking with patients daughter, Velva Harman. Questions answered. Informed consent obtained. Will continue to monitor.
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
## 2016-10-07 NOTE — Progress Notes (Signed)
Pharmacy Antibiotic Note  [name] is a 83 y.o. male admitted on 10/06/2016 with wound infection/ Osteomyelitis.  He is now s/p left BKA. Pharmacy consulted to re-start IV vancomycin and manage Zosyn for empiric coverage.  Patient had received a loading dose of Vancomycin at 1957 yesterday. WBC wnl, afebrile. nCrCl ~ 25-30 mL/min.   Plan: Start Vancomycin 1250 mg IV Q 24 hours Continue Zosyn 3.375 gm IV Q 8 hours  Monitor for Scr, c/s, clinical resolution. F/u de-escalation plan/LOT, vancomycin trough as indicated   Height: 5\' 8"  (172.7 cm) Weight: 198 lb (89.8 kg) IBW/kg (Calculated) : 68.4  Temp (24hrs), Avg:98.2 F (36.8 C), Min:97.5 F (36.4 C), Max:98.6 F (37 C)   Recent Labs Lab 10/06/16 1658 10/07/16 0205  WBC 8.5 8.1  CREATININE 2.09* 2.01*    Estimated Creatinine Clearance: 30.3 mL/min (A) (by C-G formula based on SCr of 2.01 mg/dL (H)).    No Known Allergies   Antibiotics: 10/2 Vanc>> 10/2 Zosyn>>   Cultures: 9/27 Left foot > klebsiella oxytoca (R to ampicillin; I to Unasyn)                          Enterococcus Faecalis    Thank you for allowing pharmacy to be a part of this patient's care.  Albertina Parr, PharmD., BCPS Clinical Pharmacist Pager [phone]

## 2016-10-07 NOTE — Anesthesia Preprocedure Evaluation (Addendum)
Anesthesia Evaluation  Patient identified by MRN, date of birth, ID band Patient awake    Reviewed: Allergy & Precautions, H&P , NPO status , Patient's Chart, lab work & pertinent test results  Airway Mallampati: II  TM Distance: >3 FB Neck ROM: Full    Dental  (+) Edentulous Upper, Edentulous Lower   Pulmonary shortness of breath and with exertion, sleep apnea , COPD, former smoker,    Pulmonary exam normal breath sounds clear to auscultation       Cardiovascular hypertension, Pt. on medications and Pt. on home beta blockers + CAD, + CABG, +CHF and + DVT  negative cardio ROS Normal cardiovascular exam+ dysrhythmias Atrial Fibrillation  Rhythm:Regular Rate:Normal     Neuro/Psych negative neurological ROS  negative psych ROS   GI/Hepatic Neg liver ROS, GERD  Medicated,  Endo/Other  diabetes, Insulin Dependent  Renal/GU Renal diseasenegative Renal ROS  negative genitourinary   Musculoskeletal negative musculoskeletal ROS (+)   Abdominal   Peds negative pediatric ROS (+)  Hematology negative hematology ROS (+)   Anesthesia Other Findings ECHO 6/18 Impressions:  - Mild to moderate LVH with LVEF 60-65%. Indeterminate diastolic   function. Mild left atrial enlargement. Mildly calcified mitral   annulus with mild mitral regurgitation. Sclerotic aortic valve   without stenosis. Trivial tricuspid regurgitation with PASP   estimated 42 mmHg. Prominent pericardial fat pad noted.  Reproductive/Obstetrics negative OB ROS                            Anesthesia Physical  Anesthesia Plan  ASA: IV and emergent  Anesthesia Plan: General   Post-op Pain Management:    Induction: Intravenous  PONV Risk Score and Plan:   Airway Management Planned: LMA  Additional Equipment:   Intra-op Plan:   Post-operative Plan:   Informed Consent: I have reviewed the patients History and Physical, chart,  labs and discussed the procedure including the risks, benefits and alternatives for the proposed anesthesia with the patient or authorized representative who has indicated his/her understanding and acceptance.     Plan Discussed with: CRNA and Surgeon  Anesthesia Plan Comments: ( )       Anesthesia Quick Evaluation

## 2016-10-07 NOTE — Consult Note (Addendum)
[facility] Nurse wound consult note Reason for Consult: Consult requested for buttocks.  Pt is followed by ortho service for a foot wound. Wound type: Buttock red and macerated with patchy areas of rash; appearance is consistent with moisture associated skin damage and probably candidiasis, affected area is 5X4cm.  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  It is NOT a pressure injury. Pale red wound with loose skin surrounding with shaggy appearance. Dressing procedure/placement/frequency: Barrier cream to protect and repel moisture and antifungal powder over this to treat possible candidiasis. Discussed plan of care with pt and family member at the bedside. Please re-consult if further assistance is needed.  Thank-you,  Julien Girt MSN, [place], [place], [place], [place]

## 2016-10-07 NOTE — Brief Op Note (Signed)
10/06/2016 - 10/07/2016  6:38 PM  PATIENT:  [name]  83 y.o. male  PRE-OPERATIVE DIAGNOSIS:  Osteomyitis Left Foot  POST-OPERATIVE DIAGNOSIS:  Osteomyitis Left Foot  PROCEDURE:  Procedure(s): AMPUTATION BELOW KNEE (Left)  SURGEON:  Surgeon(s) and Role:    [name], MD - Primary  PHYSICIAN ASSISTANT:   ASSISTANTS: April Fulp RNFA  ANESTHESIA:   general  EBL:  Total I/O In: 900 [I.V.:900] Out: 50 [Urine:50]  BLOOD ADMINISTERED:none  DRAINS: none   LOCAL MEDICATIONS USED:  NONE  SPECIMEN:  Source of Specimen:  BKA  DISPOSITION OF SPECIMEN:  PATHOLOGY  COUNTS:  YES  TOURNIQUET:   Total Tourniquet Time Documented: Thigh (Left) - 14 minutes Total: Thigh (Left) - 14 minutes   DICTATION: .Viviann Spare Dictation  PLAN OF CARE: already IP  PATIENT DISPOSITION:  PACU - hemodynamically stable.   Delay start of Pharmacological VTE agent (>24hrs) due to surgical blood loss or risk of bleeding: yes

## 2016-10-07 NOTE — Op Note (Signed)
Preop diagnosis left foot osteomyelitis with abscess, metatarsals and phalanx osteomyelitis.  Postop diagnosis: Same  Procedure: Left below-knee amputation  Surgeon: Rodell Perna M.D.  Assistant: April Fulp RN FA present for the entire procedure  Anesthesia Gen.  Tourniquet time less than 1 hour.  Procedure after standard prepping draping covering the foot stopping at the ankle with the Betadine Steri-Drape to isolate the the draining open ulcer on the plantar surface of his foot his leg was prepped up to the proximal thigh tourniquet with DuraPrep usual impervious stockinette Caban extremity sheets and drapes were applied leg was elevated tourniquet inflated to 350 after timeout procedure. Patient was started on vancomycin and Zosyn. Skin the marker was used to outline using a posterior flap 67 cm below the tibial tubercle which allowed in the posterior skin. There is significant considerable edema and there was a mild duskiness of the muscle. Muscle was viable but was slightly. Pale pink. Arteries were cut there is considerable arterial sclerosis involving the arteries veins were ligated there is were cut on allowed to fall back under stretch. A sling saw was used to divide the tibia beveling the anterior aspect fibula was divided shorter than the tibia an amputation knife was used to amputate on the tibia and fibula out to the end of the posterior stump. The soleus was trimmed back thinning the flap and then it was pulled anteriorly after copious irrigation tourniquet deflation hemostasis. Sterile nerve was cut back source peroneal nerve cut back. The tendon and muscle of the posterior gastrocs is pulled anterior and sewed to the anterior tibia tibial periosteum over the tibia. Fascia was closed on each side pedal and lateral 20 subtendinous tissue and then 2-0 nylon skin sutures. Postop dressing was applied and a knee immobilizer patient tolerated procedure well transferred to care room in stable  condition.

## 2016-10-07 NOTE — Anesthesia Procedure Notes (Signed)
Procedure Name: LMA Insertion Date/Time: 10/07/2016 6:01 PM Performed by: Sampson Si E Pre-anesthesia Checklist: Patient identified, Emergency Drugs available, Suction available and Patient being monitored Patient Re-evaluated:Patient Re-evaluated prior to induction Oxygen Delivery Method: Circle System Utilized Preoxygenation: Pre-oxygenation with 100% oxygen Induction Type: IV induction Ventilation: Mask ventilation without difficulty LMA: LMA inserted LMA Size: 5.0 Number of attempts: 1 Airway Equipment and Method: Bite block Placement Confirmation: positive ETCO2 Tube secured with: Tape Dental Injury: Teeth and Oropharynx as per pre-operative assessment
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
## 2016-10-07 NOTE — Consult Note (Addendum)
WOC consult requested for foot wound.  MRI indicates: "Osteomyelitis of the second, third and fourth metatarsals and of the proximal phalanges of the third and fourth toes."  This complex medical condition is beyond the scope of practice for [facility] nurses.  EMR indicates that ortho service has been consulted; please refer to their team for further plan of care. Please re-consult if further assistance is needed.  Thank-you,  Julien Girt MSN, [place], [place], [place], [place]

## 2016-10-07 NOTE — Interval H&P Note (Signed)
History and Physical Interval Note:  10/07/2016 5:05 PM  [name]  has presented today for surgery, with the diagnosis of Osteomyitis Left Foot  The various methods of treatment have been discussed with the patient and family. After consideration of risks, benefits and other options for treatment, the patient has consented to  Procedure(s): AMPUTATION BELOW KNEE (Left) as a surgical intervention .  The patient's history has been reviewed, patient examined, no change in status, stable for surgery.  I have reviewed the patient's chart and labs.  Questions were answered to the patient's satisfaction.     [name]

## 2016-10-07 NOTE — Progress Notes (Signed)
Patient is complaining of anxiety. MD notified. Verbal order to give 0.5 mg PO Ativan once. Orders placed and followed. Will continue to monitor.

## 2016-10-07 NOTE — Anesthesia Procedure Notes (Addendum)
Anesthesia Regional Block: Popliteal block   Pre-Anesthetic Checklist: ,, timeout performed, Correct Patient, Correct Site, Correct Laterality, Correct Procedure, Correct Position, site marked, Risks and benefits discussed,  Surgical consent,  Pre-op evaluation,  At surgeon's request and post-op pain management  Laterality: Left  Prep: chloraprep       Needles:  Injection technique: Single-shot  Needle Type: Echogenic Stimulator Needle     Needle Length: 5cm  Needle Gauge: 22     Additional Needles:   Procedures:, nerve stimulator,,, ultrasound used (permanent image in chart),,,,  Narrative:  Start time: 10/07/2016 5:35 PM End time: 10/07/2016 5:40 PM Injection made incrementally with aspirations every 5 mL.  Performed by: Personally  Anesthesiologist: Zamirah Denny  Additional Notes: Functioning IV was confirmed and monitors were applied.  A 82mm 22ga Arrow echogenic stimulator needle was used. Sterile prep and drape,hand hygiene and sterile gloves were used. Ultrasound guidance: relevant anatomy identified, needle position confirmed, local anesthetic spread visualized around nerve(s)., vascular puncture avoided.  Image printed for medical record. Negative aspiration and negative test dose prior to incremental administration of local anesthetic. The patient tolerated the procedure well.

## 2016-10-07 NOTE — Progress Notes (Signed)
Initial Nutrition Assessment  DOCUMENTATION CODES:   Obesity unspecified  INTERVENTION:   -Once diet is advanced, add:  -Glucerna Shake po BID, each supplement provides 220 kcal and 10 grams of protein -30 ml Prostat BID, each supplement provides 100 kcals and 15 grams protein -MVI daily  NUTRITION DIAGNOSIS:   Increased nutrient needs related to wound healing as evidenced by estimated needs.  GOAL:   Patient will meet greater than or equal to 90% of their needs  MONITOR:   PO intake, Supplement acceptance, Diet advancement, Labs, Weight trends, Skin, I & O's  REASON FOR ASSESSMENT:   Consult Wound healing  ASSESSMENT:   83 year old male with type 2 diabetes atrial fib hypertension and stage III kidney disease has been in the hospital and [facility] and then out had previous treatment for puncture wound to his foot with diabetic ulcer with abscess.   Pt admitted with lt foot ulcer with cellulitis.   Pt NPO in anticipation for lt BKA today.   Spoke with pt and daughter at bedside. Pt was admitted from [facility], where he was receiving short term rehab. Pt and daughter both endorse poor appetite over the past 1-2 months, estimated he consumes about 255 of meals served. He was receiving Glucerna supplements at [facility] and consuming well.   Wt hx reviewed. Pt has experienced a 13.1% wt loss over the past 6 months, which is significant for time frame. Suspect uncontrolled DM may be related to wt loss; noted last Hgb A1c: 8.5 (09/24/16). Per ED notes, PTA DM medications 30 units insulin glargine daily.   Reviewed [facility] notes; pt with partial thickness wound on top of gluteal fold and osteomyelitis of lt foot.   Nutrition-Focused physical exam completed. Findings are no fat depletion, no muscle depletion, and mild edema.   Discussed with pt and daughter importance of good intake of meals and supplements to prevent wound healing. Pt amenable to continue Glucerna supplements.    Labs reviewed: CBGS: 161-178 (current inpatient orders for glycemic control are 10 units insulin glargine daily and 0-9 units insulin aspart every 4 hours).  Diet Order:  Diet NPO time specified Except for: Sips with Meds  Skin:  Wound (see comment) (partial thickness wound to buttocks, lt foot DM ulcer w oste)  Last BM:  10/07/16  Height:   Ht Readings from Last 1 Encounters:  10/07/16 5\' 8"  (1.727 m)    Weight:   Wt Readings from Last 1 Encounters:  10/07/16 198 lb (89.8 kg)    Ideal Body Weight:  70 kg  BMI:  Body mass index is 30.11 kg/m.  Estimated Nutritional Needs:   Kcal:  1900-2100  Protein:  90-105 grams  Fluid:  >1.9 L  EDUCATION NEEDS:   Education needs addressed  [name], RD, LDN, CDE Pager: [phone] After hours Pager: [phone]

## 2016-10-08 ENCOUNTER — Encounter (HOSPITAL_COMMUNITY): Payer: Self-pay | Admitting: Orthopaedic Surgery

## 2016-10-08 ENCOUNTER — Encounter (HOSPITAL_BASED_OUTPATIENT_CLINIC_OR_DEPARTMENT_OTHER): Payer: Medicare Other

## 2016-10-08 ENCOUNTER — Encounter (HOSPITAL_COMMUNITY)
Admission: RE | Admit: 2016-10-08 | Discharge: 2016-10-08 | Disposition: A | Discharge status: Home / Self Care | Payer: Medicare Other | Source: Skilled Nursing Facility | Attending: Internal Medicine | Admitting: Internal Medicine

## 2016-10-08 DIAGNOSIS — J189 Pneumonia, unspecified organism: Secondary | ICD-10-CM | POA: Insufficient documentation

## 2016-10-08 DIAGNOSIS — E11621 Type 2 diabetes mellitus with foot ulcer: Secondary | ICD-10-CM | POA: Insufficient documentation

## 2016-10-08 DIAGNOSIS — Z48817 Encounter for surgical aftercare following surgery on the skin and subcutaneous tissue: Secondary | ICD-10-CM | POA: Insufficient documentation

## 2016-10-08 DIAGNOSIS — E1165 Type 2 diabetes mellitus with hyperglycemia: Secondary | ICD-10-CM | POA: Insufficient documentation

## 2016-10-08 DIAGNOSIS — L03119 Cellulitis of unspecified part of limb: Secondary | ICD-10-CM

## 2016-10-08 DIAGNOSIS — L02619 Cutaneous abscess of unspecified foot: Secondary | ICD-10-CM

## 2016-10-08 DIAGNOSIS — I5032 Chronic diastolic (congestive) heart failure: Secondary | ICD-10-CM | POA: Insufficient documentation

## 2016-10-08 LAB — BASIC METABOLIC PANEL
ANION GAP: 14 (ref 5–15)
BUN: 24 mg/dL — ABNORMAL HIGH (ref 6–20)
CALCIUM: 9.9 mg/dL (ref 8.9–10.3)
CO2: 25 mmol/L (ref 22–32)
CREATININE: 2.05 mg/dL — AB (ref 0.61–1.24)
Chloride: 99 mmol/L — ABNORMAL LOW (ref 101–111)
GFR, EST AFRICAN AMERICAN: 33 mL/min — AB (ref >60)
GFR, EST NON AFRICAN AMERICAN: 28 mL/min — AB (ref >60)
GLUCOSE: 220 mg/dL — AB (ref 65–99)
Potassium: 4.5 mmol/L (ref 3.5–5.1)
Sodium: 138 mmol/L (ref 135–145)

## 2016-10-08 LAB — CBC
HCT: 27.7 % — ABNORMAL LOW (ref 39.0–52.0)
Hemoglobin: 8.7 g/dL — ABNORMAL LOW (ref 13.0–17.0)
MCH: 26.9 pg (ref 26.0–34.0)
MCHC: 31.4 g/dL (ref 30.0–36.0)
MCV: 85.8 fL (ref 78.0–100.0)
PLATELETS: 185 10*3/uL (ref 150–400)
RBC: 3.23 MIL/uL — AB (ref 4.22–5.81)
RDW: 14.2 % (ref 11.5–15.5)
WBC: 7.2 10*3/uL (ref 4.0–10.5)

## 2016-10-08 LAB — GLUCOSE, CAPILLARY
GLUCOSE-CAPILLARY: 186 mg/dL — AB (ref 65–99)
GLUCOSE-CAPILLARY: 199 mg/dL — AB (ref 65–99)
Glucose-Capillary: 191 mg/dL — ABNORMAL HIGH (ref 65–99)
Glucose-Capillary: 216 mg/dL — ABNORMAL HIGH (ref 65–99)
Glucose-Capillary: 221 mg/dL — ABNORMAL HIGH (ref 65–99)
Glucose-Capillary: 255 mg/dL — ABNORMAL HIGH (ref 65–99)

## 2016-10-08 MED ORDER — INSULIN ASPART 100 UNIT/ML ~~LOC~~ SOLN
0.0000 [IU] | Freq: Three times a day (TID) | SUBCUTANEOUS | Status: DC
Start: 2016-10-08 — End: 2016-10-10
  Administered 2016-10-08: 5 [IU] via SUBCUTANEOUS
  Administered 2016-10-08 – 2016-10-09 (×2): 2 [IU] via SUBCUTANEOUS
  Administered 2016-10-09: 5 [IU] via SUBCUTANEOUS
  Administered 2016-10-09 – 2016-10-10 (×2): 3 [IU] via SUBCUTANEOUS
  Administered 2016-10-10: 2 [IU] via SUBCUTANEOUS

## 2016-10-08 MED ORDER — IBUPROFEN 400 MG PO TABS
400.0000 mg | ORAL_TABLET | Freq: Four times a day (QID) | ORAL | Status: DC | PRN
  Administered 2016-10-09: 400 mg via ORAL
  Filled 2016-10-08: qty 1

## 2016-10-08 MED ORDER — HYDROCOD POLST-CPM POLST ER 10-8 MG/5ML PO SUER
5.0000 mL | Freq: Two times a day (BID) | ORAL | Status: DC
  Administered 2016-10-08 – 2016-10-10 (×4): 5 mL via ORAL
  Filled 2016-10-08 (×4): qty 5

## 2016-10-08 NOTE — Progress Notes (Signed)
Subjective: Doing well.  Pain controlled.   Objective: Vital signs in last 24 hours: Temp:  [97.5 F (36.4 C)-98.4 F (36.9 C)] 97.9 F (36.6 C) (10/04 1049) Pulse Rate:  [64-101] 82 (10/04 1049) Resp:  [14-27] 18 (10/04 1049) BP: (120-150)/(55-94) 150/58 (10/04 1049) SpO2:  [87 %-100 %] 97 % (10/04 1049) Weight:  [198 lb (89.8 kg)] 198 lb (89.8 kg) (10/03 1647)  Intake/Output from previous day: 10/03 0701 - 10/04 0700 In: 1600 [I.V.:1600] Out: 425 [Urine:400; Blood:25] Intake/Output this shift: Total I/O In: 0  Out: 500 [Urine:500]   Recent Labs  10/06/16 1658 10/07/16 0205 10/08/16 0520  HGB 8.4* 7.4* 8.7*    Recent Labs  10/07/16 0205 10/07/16 0959 10/08/16 0520  WBC 8.1  --  7.2  RBC 2.72* 3.29* 3.23*  HCT 23.4*  --  27.7*  PLT 194  --  185    Recent Labs  10/07/16 0205 10/08/16 0520  NA 136 138  K 4.1 4.5  CL 95* 99*  CO2 30 25  BUN 29* 24*  CREATININE 2.01* 2.05*  GLUCOSE 176* 220*  CALCIUM 9.9 9.9   No results for input(s): LABPT, INR in the last 72 hours.  Exam: Very pleasant male. Alert and oriented. Dressing c/d/i. Stump elevated.   Assessment/Plan: Start PT/OT.  Lives alone and will likely need CIR vs SNF for rehab.  D/c dilaudid.    [name] 10/08/2016, 1:12 PM

## 2016-10-08 NOTE — Evaluation (Signed)
Occupational Therapy Evaluation Patient Details Name: [name] MRN: [id number] DOB: 1931-06-19 Today's Date: 10/08/2016    History of Present Illness Pt is a 83 y/o male s/p L BKA. Pt has a past medical history including BPH; CAD; Cellulitis; Chronic back pain; Chronic diastolic CHF; CKD; COPD ; Essential hypertension; Gastric mass; GERD; History of DVT; HOH; CABG; Hyperlipidemia; Persistent atrial fibrillation; Sleep apnea; Type 2 DM; Coronary artery bypass graft (2011); Cardioversion (03/20/2016).   Clinical Impression   PTA Pt independent in ADL and mobility. Pt is currently max A for LB ADL, set up for UB ADL and max +2 for stand pivot transfers. Pt presenting with deficits as listed below (see OT problem list) and will require skilled OT in the acute setting as well as SNF level therapy at dc to maximize safety and independence in ADL and functional transfers. Pt will require SNF as he lives alone. Next session to focus on sitting balance for LB ADL, and intro of sliding board for transfers.     Follow Up Recommendations  SNF;Supervision/Assistance - 24 hour    Equipment Recommendations  Other (comment) (defer to next venue)    Recommendations for Other Services       Precautions / Restrictions Precautions Precautions: Fall Precaution Comments: new L BKA Required Braces or Orthoses: Knee Immobilizer - Left Knee Immobilizer - Left:  (no order, Pt in KI when OT enetered the room) Restrictions Weight Bearing Restrictions: Yes LLE Weight Bearing: Non weight bearing Other Position/Activity Restrictions: elevate new BKA      Mobility Bed Mobility Overal bed mobility: Needs Assistance Bed Mobility: Supine to Sit     Supine to sit: Min assist     General bed mobility comments: vc for sequencing, Pt able to bring BLE to EOB without assist, and heavy use of bed rails/min A for trunk elevation  Transfers Overall transfer level: Needs assistance Equipment used: Rolling  walker (2 wheeled) Transfers: Sit to/from [facility] Sit to Stand: Mod assist;Max assist;+2 physical assistance;+2 safety/equipment Stand pivot transfers: +2 safety/equipment;+2 physical assistance;Max assist       General transfer comment: stand pivot x 2, fatigues dramtically for second time, struggles to bear weight through his BUE to assist with WB, RLE buckled on the second transfer    Balance Overall balance assessment: Needs assistance Sitting-balance support: Single extremity supported;Bilateral upper extremity supported;Feet supported Sitting balance-Leahy Scale: Fair Sitting balance - Comments: EOB without back support   Standing balance support: Bilateral upper extremity supported;During functional activity Standing balance-Leahy Scale: Zero Standing balance comment: dependent on therapist/NT support - fatigues quickly                           ADL either performed or assessed with clinical judgement   ADL Overall ADL's : Needs assistance/impaired Eating/Feeding: Modified independent;Sitting   Grooming: Set up;Sitting   Upper Body Bathing: Moderate assistance;Sitting Upper Body Bathing Details (indicate cue type and reason): assist for back, Pt able to wash front including arm pits Lower Body Bathing: Maximal assistance;Sitting/lateral leans   Upper Body Dressing : Set up   Lower Body Dressing: Maximal assistance;+2 for physical assistance;+2 for safety/equipment   Toilet Transfer: +2 for physical assistance;Maximal assistance;+2 for safety/equipment;Cueing for sequencing;Stand-pivot;RW;BSC Toilet Transfer Details (indicate cue type and reason): transfer to the right, Pt really struggled to WB through arms for "hop" ended up leaning into therapist and R leg did not move from ground Toileting- Clothing Manipulation and Hygiene:  Maximal assistance;+2 for safety/equipment       Functional mobility during ADLs: Maximal assistance;+2 for  physical assistance;+2 for safety/equipment;Rolling walker (stand pivot only)       Vision Patient Visual Report: No change from baseline       Perception     Praxis      Pertinent Vitals/Pain Pain Assessment: 0-10 Pain Score: 2  Pain Location: Left Leg Pain Descriptors / Indicators: Discomfort;Dull Pain Intervention(s): Monitored during session;Repositioned (elevated)     Hand Dominance Right   Extremity/Trunk Assessment Upper Extremity Assessment Upper Extremity Assessment: Overall WFL for tasks assessed   Lower Extremity Assessment Lower Extremity Assessment: LLE deficits/detail LLE Deficits / Details: new BKA - in immobilizer LLE: Unable to fully assess due to immobilization   Cervical / Trunk Assessment Cervical / Trunk Assessment: Normal   Communication Communication Communication: HOH   Cognition Arousal/Alertness: Awake/alert Behavior During Therapy: WFL for tasks assessed/performed Overall Cognitive Status: Within Functional Limits for tasks assessed                                     General Comments       Exercises     Shoulder Instructions      Home Living Family/patient expects to be discharged to:: Skilled nursing facility Living Arrangements: Alone Available Help at Discharge: Family;Available PRN/intermittently Type of Home: Mobile home Home Access: Stairs to enter Entrance Stairs-Number of Steps: 3 Entrance Stairs-Rails: Can reach both Home Layout: One level     Bathroom Shower/Tub: Occupational psychologist: Standard                Prior Functioning/Environment Level of Independence: Independent                 OT Problem List: Decreased strength;Decreased activity tolerance;Impaired balance (sitting and/or standing);Decreased coordination;Decreased safety awareness;Decreased knowledge of use of DME or AE;Decreased knowledge of precautions;Pain      OT Treatment/Interventions: Self-care/ADL  training;Modalities;Therapeutic exercise;Therapeutic activities;DME and/or AE instruction;Patient/family education;Manual therapy;Balance training;Energy conservation    OT Goals(Current goals can be found in the care plan section) Acute Rehab OT Goals Patient Stated Goal: go to rehab then go home OT Goal Formulation: With patient Time For Goal Achievement: 10/22/16 Potential to Achieve Goals: Good ADL Goals Pt Will Perform Grooming: with supervision;sitting Pt Will Perform Upper Body Bathing: with supervision;sitting Pt Will Perform Lower Body Bathing: with min assist;sitting/lateral leans Pt Will Transfer to Toilet: with mod assist;with transfer board Pt Will Perform Toileting - Clothing Manipulation and hygiene: sitting/lateral leans;with supervision Additional ADL Goal #1: Pt will perform bed mobility at supervision level prior to initiating ADL activity  OT Frequency: Min 2X/week   Barriers to D/C: Decreased caregiver support  Pt lives alone       Co-evaluation              AM-PAC PT "6 Clicks" Daily Activity     Outcome Measure Help from another person eating meals?: None Help from another person taking care of personal grooming?: A Little Help from another person toileting, which includes using toliet, bedpan, or urinal?: Total Help from another person bathing (including washing, rinsing, drying)?: A Lot Help from another person to put on and taking off regular upper body clothing?: A Little Help from another person to put on and taking off regular lower body clothing?: Total 6 Click Score: 14   End of Session Nurse  Communication: Mobility status  Activity Tolerance: Patient limited by fatigue Patient left: in chair;with call bell/phone within reach;with chair alarm set;with SCD's reapplied  OT Visit Diagnosis: Unsteadiness on feet (R26.81);Other abnormalities of gait and mobility (R26.89);Muscle weakness (generalized) (M62.81);Pain Pain - Right/Left: Left Pain -  part of body: Leg                Time: 1433-1501 OT Time Calculation (min): 28 min Charges:  OT General Charges $OT Visit: 1 Visit OT Evaluation $OT Eval Moderate Complexity: 1 Mod OT Treatments $Self Care/Home Management : 8-22 mins G-Codes:     Hulda Humphrey OTR/L [place] 10/08/2016, 3:50 PM

## 2016-10-08 NOTE — Progress Notes (Signed)
OT Cancellation Note  Patient Details Name: [name] MRN: [id number] DOB: 04-09-1931   Cancelled Treatment:    Reason Eval/Treat Not Completed: Patient not medically ready. Pt underwent L BKA yesterday, 10-07-16. Bedrest order placed 10-07-16 at 2051. Please update activity order, when appropriate, for OT to proceed with eval.  Merri Ray Kamaal Cast 10/08/2016, 9:23 AM  Hulda Humphrey OTR/L [phone]

## 2016-10-08 NOTE — Progress Notes (Signed)
PROGRESS NOTE    [name]   RUE:[id number]  DOB: 08-May-1931  DOA: 10/06/2016 PCP: [name], MD   Brief Narrative:  [name] is a 83 y.o. male with medical history significant of DM2, A.fib, HTN, CKD stage 3.  Patient was originally treated for cellulitis and diabetic foot ulcer of the L foot at the end of Aug.  I+D and deep wound cultures at that time grew out K. Oxytoca and enterococcus (see Epic for sensitivities).  He was started on zosyn/vanc, discharged on the 4th of Sept to complete 7 additional days of Zosyn / vanc at the SNF. On 9/25 per NH notes his foot was noted to be looking more red and inflamed.  He was seen at wound care on 9/27, repeat I+D performed, cultures would again grow out K.Oxytoca and enterococcus (same sensitivities).  MRI of foot was obtained and demonstrated large abscess and findings worrisome for osteomyelitis.   Subjective:  Pain is controlled. Has mild cough.  ROS: no complaints of nausea, vomiting, dyspnea or dysuria. No other complaints.   Assessment & Plan:   Principal Problem:   Diabetic foot infection - osteomyelitis and abscess of right forefoot - normal ABI 10/07/16 -  BKA 10/3- Dr Lorin Mercy - needs PT/OT  Active Problems: Anemia - chronic and near his baseline of 8   -   anemia panel consistent with low Iron and chronic disease  AKI on CKD 3  - cont to hold Lasix  - last ECHO 6/18- showed normal EF and indeterminate dCHF    Essential hypertension - cont Clonidine, Norvasc, metoprolol - Avapro and Lasix on hold    DM2 - cont Lantus at 10 U and SSI Q 4     Atrial fibrillation   - Eliquis and Metoprlol   BPH - Flomax  Constipation - PRN Miralax  DVT prophylaxis: Eliquis Code Status: DNR Family Communication: wife Disposition Plan:  Consultants:   ortho Procedures:    Antimicrobials:  Anti-infectives    Start     Dose/Rate Route Frequency Ordered Stop   10/07/16 2200  vancomycin (VANCOCIN) 1,250 mg  in sodium chloride 0.9 % 250 mL IVPB  Status:  Discontinued     1,250 mg 166.7 mL/hr over 90 Minutes Intravenous Every 24 hours 10/07/16 2105 10/08/16 1029   10/07/16 2000  vancomycin (VANCOCIN) IVPB 1000 mg/200 mL premix  Status:  Discontinued     1,000 mg 200 mL/hr over 60 Minutes Intravenous Every 24 hours 10/06/16 2052 10/06/16 2146   10/07/16 0400  piperacillin-tazobactam (ZOSYN) IVPB 3.375 g  Status:  Discontinued     3.375 g 12.5 mL/hr over 240 Minutes Intravenous Every 8 hours 10/06/16 2052 10/08/16 1029   10/06/16 1915  vancomycin (VANCOCIN) 2,000 mg in sodium chloride 0.9 % 500 mL IVPB     2,000 mg 250 mL/hr over 120 Minutes Intravenous  Once 10/06/16 1902 10/06/16 2158   10/06/16 1915  piperacillin-tazobactam (ZOSYN) IVPB 3.375 g     3.375 g 100 mL/hr over 30 Minutes Intravenous  Once 10/06/16 1902 10/06/16 2028       Objective: Vitals:   10/07/16 2000 10/07/16 2100 10/08/16 0415 10/08/16 1049  BP: (!) 145/76 (!) 145/60 120/63 (!) 150/58  Pulse: 82 64 76 82  Resp: 19 20 18 18   Temp: (!) 97.5 F (36.4 C) 98.2 F (36.8 C) 98.4 F (36.9 C) 97.9 F (36.6 C)  TempSrc:  Oral Oral Oral  SpO2: 100% 98% 96% 97%  Weight:  Height:        Intake/Output Summary (Last 24 hours) at 10/08/16 1619 Last data filed at 10/08/16 0900  Gross per 24 hour  Intake              700 ml  Output              875 ml  Net             -175 ml   Filed Weights   10/06/16 1644 10/06/16 2352 10/07/16 1647  Weight: 90.1 kg (198 lb 10.2 oz) 90.2 kg (198 lb 12.8 oz) 89.8 kg (198 lb)    Examination: General exam: Appears comfortable  HEENT: PERRLA, oral mucosa moist, no sclera icterus or thrush Respiratory system: Clear to auscultation. Respiratory effort normal. Cardiovascular system: S1 & S2 heard, RRR.  No murmurs  Gastrointestinal system: Abdomen soft, non-tender, nondistended. Normal bowel sound. No organomegaly Central nervous system: Alert and oriented. No focal neurological  deficits. Extremities: No cyanosis, clubbing or edema- left BKA- immobilizer in place Skin: No rashes- swelling of left foot- ulcer on plantar aspect of forefoot with pus noted Psychiatry:  Mood & affect appropriate.     Data Reviewed: I have personally reviewed following labs and imaging studies  CBC:  Recent Labs Lab 10/06/16 1658 10/07/16 0205 10/08/16 0520  WBC 8.5 8.1 7.2  NEUTROABS 6.5  --   --   HGB 8.4* 7.4* 8.7*  HCT 26.7* 23.4* 27.7*  MCV 86.1 86.0 85.8  PLT 229 194 709   Basic Metabolic Panel:  Recent Labs Lab 10/06/16 1658 10/07/16 0205 10/08/16 0520  NA 132* 136 138  K 4.8 4.1 4.5  CL 94* 95* 99*  CO2 32 30 25  GLUCOSE 213* 176* 220*  BUN 34* 29* 24*  CREATININE 2.09* 2.01* 2.05*  CALCIUM 10.2 9.9 9.9   GFR: Estimated Creatinine Clearance: 29.7 mL/min (A) (by C-G formula based on SCr of 2.05 mg/dL (H)). Liver Function Tests:  Recent Labs Lab 10/06/16 1658  AST 28  ALT 20  ALKPHOS 101  BILITOT 0.8  PROT 6.6  ALBUMIN 3.2*   No results for input(s): LIPASE, AMYLASE in the last 168 hours. No results for input(s): AMMONIA in the last 168 hours. Coagulation Profile: No results for input(s): INR, PROTIME in the last 168 hours. Cardiac Enzymes: No results for input(s): CKTOTAL, CKMB, CKMBINDEX, TROPONINI in the last 168 hours. BNP (last 3 results) No results for input(s): PROBNP in the last 8760 hours. HbA1C: No results for input(s): HGBA1C in the last 72 hours. CBG:  Recent Labs Lab 10/07/16 2046 10/08/16 0016 10/08/16 0411 10/08/16 0844 10/08/16 1237  GLUCAP 198* 216* 221* 191* 186*   Lipid Profile: No results for input(s): CHOL, HDL, LDLCALC, TRIG, CHOLHDL, LDLDIRECT in the last 72 hours. Thyroid Function Tests: No results for input(s): TSH, T4TOTAL, FREET4, T3FREE, THYROIDAB in the last 72 hours. Anemia Panel:  Recent Labs  10/07/16 0959  VITAMINB12 726  FOLATE 22.3  FERRITIN 77  TIBC 284  IRON 24*  RETICCTPCT 0.9    Urine analysis:    Component Value Date/Time   COLORURINE YELLOW 09/29/2016 0500   APPEARANCEUR CLEAR 09/29/2016 0500   LABSPEC 1.011 09/29/2016 0500   PHURINE 5.0 09/29/2016 0500   GLUCOSEU 50 (A) 09/29/2016 0500   HGBUR NEGATIVE 09/29/2016 0500   BILIRUBINUR NEGATIVE 09/29/2016 0500   KETONESUR NEGATIVE 09/29/2016 0500   PROTEINUR 100 (A) 09/29/2016 0500   UROBILINOGEN 0.2 12/19/2013 1905   NITRITE NEGATIVE 09/29/2016  0500   LEUKOCYTESUR NEGATIVE 09/29/2016 0500   Sepsis Labs: @LABRCNTIP (procalcitonin:4,lacticidven:4) ) Recent Results (from the past 240 hour(s))  Urine Culture     Status: Abnormal   Collection Time: 09/29/16  5:00 AM  Result Value Ref Range Status   Specimen Description URINE, CLEAN CATCH  Final   Special Requests NONE  Final   Culture (A)  Final    <10,000 COLONIES/mL INSIGNIFICANT GROWTH Performed at [facility], [address] [place], [place] [address]    Report Status 09/30/2016 FINAL  Final  Aerobic/Anaerobic Culture (surgical/deep wound)     Status: None   Collection Time: 10/01/16  3:30 PM  Result Value Ref Range Status   Specimen Description FOOT LEFT  Final   Special Requests NONE  Final   Gram Stain   Final    RARE WBC PRESENT, PREDOMINANTLY PMN MODERATE GRAM POSITIVE COCCI IN PAIRS FEW GRAM NEGATIVE COCCOBACILLI    Culture   Final    ABUNDANT KLEBSIELLA OXYTOCA FEW ENTEROCOCCUS FAECALIS NO ANAEROBES ISOLATED Performed at [facility], [place] [address], [place], [place] [address]    Report Status 10/07/2016 FINAL  Final   Organism ID, Bacteria KLEBSIELLA OXYTOCA  Final   Organism ID, Bacteria ENTEROCOCCUS FAECALIS  Final      Susceptibility   Enterococcus faecalis - MIC*    AMPICILLIN <=2 SENSITIVE Sensitive     VANCOMYCIN 1 SENSITIVE Sensitive     GENTAMICIN SYNERGY RESISTANT Resistant     * FEW ENTEROCOCCUS FAECALIS   Klebsiella oxytoca - MIC*    AMPICILLIN >=32 RESISTANT Resistant     CEFAZOLIN 8 SENSITIVE  Sensitive     CEFEPIME <=1 SENSITIVE Sensitive     CEFTAZIDIME <=1 SENSITIVE Sensitive     CEFTRIAXONE <=1 SENSITIVE Sensitive     CIPROFLOXACIN <=0.25 SENSITIVE Sensitive     GENTAMICIN <=1 SENSITIVE Sensitive     IMIPENEM <=0.25 SENSITIVE Sensitive     TRIMETH/SULFA <=20 SENSITIVE Sensitive     AMPICILLIN/SULBACTAM 16 INTERMEDIATE Intermediate     PIP/TAZO <=4 SENSITIVE Sensitive     Extended ESBL NEGATIVE Sensitive     * ABUNDANT KLEBSIELLA OXYTOCA  Blood culture (routine x 2)     Status: None (Preliminary result)   Collection Time: 10/06/16  4:58 PM  Result Value Ref Range Status   Specimen Description BLOOD LEFT FOREARM  Final   Special Requests   Final    BOTTLES DRAWN AEROBIC AND ANAEROBIC Blood Culture adequate volume   Culture NO GROWTH 2 DAYS  Final   Report Status PENDING  Incomplete  Blood culture (routine x 2)     Status: None (Preliminary result)   Collection Time: 10/06/16  6:55 PM  Result Value Ref Range Status   Specimen Description BLOOD LEFT ANTECUBITAL  Final   Special Requests   Final    BOTTLES DRAWN AEROBIC AND ANAEROBIC Blood Culture adequate volume   Culture NO GROWTH 2 DAYS  Final   Report Status PENDING  Incomplete  MRSA PCR Screening     Status: None   Collection Time: 10/07/16  3:15 PM  Result Value Ref Range Status   MRSA by PCR NEGATIVE NEGATIVE Final    Comment:        The GeneXpert MRSA Assay (FDA approved for NASAL specimens only), is one component of a comprehensive MRSA colonization surveillance program. It is not intended to diagnose MRSA infection nor to guide or monitor treatment for MRSA infections.  Radiology Studies: No results found.    Scheduled Meds: . antiseptic oral rinse  15 mL Mouth Rinse QID  . cloNIDine  0.1 mg Oral BID  . docusate sodium  100 mg Oral BID  . feeding supplement (GLUCERNA SHAKE)  237 mL Oral BID BM  . feeding supplement (PRO-STAT SUGAR FREE 64)  30 mL Oral BID BM  . FLUoxetine  20 mg  Oral Daily  . hydrocerin   Topical TID  . insulin aspart  0-9 Units Subcutaneous TID WC  . insulin glargine  10 Units Subcutaneous QHS  . lactobacillus acidophilus  1 tablet Oral BID  . metoprolol succinate  75 mg Oral Daily  . multivitamin with minerals  1 tablet Oral Daily  . pantoprazole  40 mg Oral Daily  . rosuvastatin  10 mg Oral QHS  . tamsulosin  0.4 mg Oral QHS   Continuous Infusions:    LOS: 2 days    Time spent in minutes: 35    Debbe Odea, MD [facility] Pager: [email or web address] Password [email or web address] 10/08/2016, 4:19 PM

## 2016-10-08 NOTE — Progress Notes (Signed)
PT Cancellation Note  Patient Details Name: [name] MRN: [id number] DOB: May 30, 1931   Cancelled Treatment:    Reason Eval/Treat Not Completed: Medical issues which prohibited therapy. Pt underwent L BKA yesterday, 10-07-16. Bedrest order placed 10-07-16 at 2051. Please update activity order, when appropriate, for PT to proceed with eval.   Lorriane Shire 10/08/2016, 8:37 AM

## 2016-10-09 ENCOUNTER — Inpatient Hospital Stay (HOSPITAL_COMMUNITY): Payer: Medicare Other

## 2016-10-09 LAB — GLUCOSE, CAPILLARY
GLUCOSE-CAPILLARY: 176 mg/dL — AB (ref 65–99)
GLUCOSE-CAPILLARY: 238 mg/dL — AB (ref 65–99)
GLUCOSE-CAPILLARY: 232 mg/dL — AB (ref 65–99)
Glucose-Capillary: 268 mg/dL — ABNORMAL HIGH (ref 65–99)

## 2016-10-09 MED ORDER — APIXABAN 5 MG PO TABS: 5.0000 mg | ORAL_TABLET | Freq: Two times a day (BID) | ORAL | Status: DC

## 2016-10-09 MED ORDER — APIXABAN 2.5 MG PO TABS
2.5000 mg | ORAL_TABLET | Freq: Two times a day (BID) | ORAL | Status: DC
  Administered 2016-10-09 – 2016-10-10 (×2): 2.5 mg via ORAL
  Filled 2016-10-09 (×3): qty 1

## 2016-10-09 NOTE — Progress Notes (Signed)
[name]   NFA:[id number]  DOB: 02/06/1931  DOA: 10/06/2016 PCP: [name], MD   Brief Narrative:  [name] is a 83 y.o. male with medical history significant of DM2, A.fib, HTN, CKD stage 3.  Patient was originally treated for cellulitis and diabetic foot ulcer of the L foot at the end of Aug.  I+D and deep wound cultures at that time grew out K. Oxytoca and enterococcus (see Epic for sensitivities).  He was started on zosyn/vanc, discharged on the 4th of Sept to complete 7 additional days of Zosyn / vanc at the SNF. On 9/25 per NH notes his foot was noted to be looking more red and inflamed.  He was seen at wound care on 9/27, repeat I+D performed, cultures would again grow out K.Oxytoca and enterococcus (same sensitivities).  MRI of foot was obtained and demonstrated large abscess and findings worrisome for osteomyelitis.   Subjective:   Has mild cough. Pain in leg is reasonable controlled. ROS: no complaints of nausea, vomiting, dyspnea or dysuria. No other complaints.   Assessment & Plan:   Principal Problem:   Diabetic foot infection - osteomyelitis and abscess of right forefoot - normal ABI 10/07/16 -  BKA 10/3- Dr Lorin Mercy - SNF vs CIR per PT- social work alerted - resume Eliquis today per ortho- d/w Dr Lorin Mercy  Active Problems: Anemia - chronic and near his baseline of 8   -   anemia panel consistent AOCD  Cough with sputum - per patient and family, this is chronic- no fever or leukocytosis- CXR not highly suggestive of pneumonia  AKI on CKD 3  - cont to hold Lasix and Avapro - last ECHO 6/18- showed normal EF and indeterminate dCHF - Cr unchanged despite holding Lasix - check renal ultrasound    Essential hypertension - cont Clonidine, Norvasc, metoprolol - Avapro and Lasix on hold    DM2 - cont Lantus at 10 U and SSI       Atrial fibrillation   - Eliquis- dose adjustment due to increasing Cr - cont Metoprlol   BPH -  Flomax  Constipation - PRN Miralax  DVT prophylaxis: Eliquis Code Status: DNR Family Communication: wife Disposition Plan:  Consultants:   ortho Procedures:    Antimicrobials:  Anti-infectives    Start     Dose/Rate Route Frequency Ordered Stop   10/07/16 2200  vancomycin (VANCOCIN) 1,250 mg in sodium chloride 0.9 % 250 mL IVPB  Status:  Discontinued     1,250 mg 166.7 mL/hr over 90 Minutes Intravenous Every 24 hours 10/07/16 2105 10/08/16 1029   10/07/16 2000  vancomycin (VANCOCIN) IVPB 1000 mg/200 mL premix  Status:  Discontinued     1,000 mg 200 mL/hr over 60 Minutes Intravenous Every 24 hours 10/06/16 2052 10/06/16 2146   10/07/16 0400  piperacillin-tazobactam (ZOSYN) IVPB 3.375 g  Status:  Discontinued     3.375 g 12.5 mL/hr over 240 Minutes Intravenous Every 8 hours 10/06/16 2052 10/08/16 1029   10/06/16 1915  vancomycin (VANCOCIN) 2,000 mg in sodium chloride 0.9 % 500 mL IVPB     2,000 mg 250 mL/hr over 120 Minutes Intravenous  Once 10/06/16 1902 10/06/16 2158   10/06/16 1915  piperacillin-tazobactam (ZOSYN) IVPB 3.375 g     3.375 g 100 mL/hr over 30 Minutes Intravenous  Once 10/06/16 1902 10/06/16 2028       Objective: Vitals:   10/08/16 2112 10/09/16 0626 10/09/16 0858 10/09/16 0900  BP: [name])  143/65 (!) 150/84 (!) 159/80   Pulse: 78 74 84 97  Resp: 18 18 18    Temp: 98.6 F (37 C) 97.9 F (36.6 C) (!) 97.4 F (36.3 C)   TempSrc: Oral Oral Oral   SpO2: 100% 99% 99%   Weight:      Height:        Intake/Output Summary (Last 24 hours) at 10/09/16 1515 Last data filed at 10/09/16 0900  Gross per 24 hour  Intake              120 ml  Output              400 ml  Net             -280 ml   Filed Weights   10/06/16 1644 10/06/16 2352 10/07/16 1647  Weight: 90.1 kg (198 lb 10.2 oz) 90.2 kg (198 lb 12.8 oz) 89.8 kg (198 lb)    Examination: General exam: Appears comfortable  HEENT: PERRLA, oral mucosa moist, no sclera icterus or thrush Respiratory system:  Clear to auscultation. Respiratory effort normal. Cardiovascular system: S1 & S2 heard, RRR.  No murmurs  Gastrointestinal system: Abdomen soft, non-tender, nondistended. Normal bowel sound. No organomegaly Central nervous system: Alert and oriented. No focal neurological deficits. Extremities: No cyanosis, clubbing or edema- left BKA- immobilizer in place Skin: No rashes-  Psychiatry:  Mood & affect appropriate.     Data Reviewed: I have personally reviewed following labs and imaging studies  CBC:  Recent Labs Lab 10/06/16 1658 10/07/16 0205 10/08/16 0520  WBC 8.5 8.1 7.2  NEUTROABS 6.5  --   --   HGB 8.4* 7.4* 8.7*  HCT 26.7* 23.4* 27.7*  MCV 86.1 86.0 85.8  PLT 229 194 924   Basic Metabolic Panel:  Recent Labs Lab 10/06/16 1658 10/07/16 0205 10/08/16 0520  NA 132* 136 138  K 4.8 4.1 4.5  CL 94* 95* 99*  CO2 32 30 25  GLUCOSE 213* 176* 220*  BUN 34* 29* 24*  CREATININE 2.09* 2.01* 2.05*  CALCIUM 10.2 9.9 9.9   GFR: Estimated Creatinine Clearance: 29.7 mL/min (A) (by C-G formula based on SCr of 2.05 mg/dL (H)). Liver Function Tests:  Recent Labs Lab 10/06/16 1658  AST 28  ALT 20  ALKPHOS 101  BILITOT 0.8  PROT 6.6  ALBUMIN 3.2*   No results for input(s): LIPASE, AMYLASE in the last 168 hours. No results for input(s): AMMONIA in the last 168 hours. Coagulation Profile: No results for input(s): INR, PROTIME in the last 168 hours. Cardiac Enzymes: No results for input(s): CKTOTAL, CKMB, CKMBINDEX, TROPONINI in the last 168 hours. BNP (last 3 results) No results for input(s): PROBNP in the last 8760 hours. HbA1C: No results for input(s): HGBA1C in the last 72 hours. CBG:  Recent Labs Lab 10/08/16 1237 10/08/16 1725 10/08/16 2140 10/09/16 0822 10/09/16 1218  GLUCAP 186* 255* 199* 176* 238*   Lipid Profile: No results for input(s): CHOL, HDL, LDLCALC, TRIG, CHOLHDL, LDLDIRECT in the last 72 hours. Thyroid Function Tests: No results for  input(s): TSH, T4TOTAL, FREET4, T3FREE, THYROIDAB in the last 72 hours. Anemia Panel:  Recent Labs  10/07/16 0959  VITAMINB12 726  FOLATE 22.3  FERRITIN 77  TIBC 284  IRON 24*  RETICCTPCT 0.9   Urine analysis:    Component Value Date/Time   COLORURINE YELLOW 09/29/2016 0500   APPEARANCEUR CLEAR 09/29/2016 0500   LABSPEC 1.011 09/29/2016 0500   PHURINE 5.0 09/29/2016 0500  GLUCOSEU 50 (A) 09/29/2016 0500   HGBUR NEGATIVE 09/29/2016 0500   BILIRUBINUR NEGATIVE 09/29/2016 0500   KETONESUR NEGATIVE 09/29/2016 0500   PROTEINUR 100 (A) 09/29/2016 0500   UROBILINOGEN 0.2 12/19/2013 1905   NITRITE NEGATIVE 09/29/2016 0500   LEUKOCYTESUR NEGATIVE 09/29/2016 0500   Sepsis Labs: @LABRCNTIP (procalcitonin:4,lacticidven:4) ) Recent Results (from the past 240 hour(s))  Aerobic/Anaerobic Culture (surgical/deep wound)     Status: None   Collection Time: 10/01/16  3:30 PM  Result Value Ref Range Status   Specimen Description FOOT LEFT  Final   Special Requests NONE  Final   Gram Stain   Final    RARE WBC PRESENT, PREDOMINANTLY PMN MODERATE GRAM POSITIVE COCCI IN PAIRS FEW GRAM NEGATIVE COCCOBACILLI    Culture   Final    ABUNDANT KLEBSIELLA OXYTOCA FEW ENTEROCOCCUS FAECALIS NO ANAEROBES ISOLATED Performed at [facility], [place] [address], [place], [place] [address]    Report Status 10/07/2016 FINAL  Final   Organism ID, Bacteria KLEBSIELLA OXYTOCA  Final   Organism ID, Bacteria ENTEROCOCCUS FAECALIS  Final      Susceptibility   Enterococcus faecalis - MIC*    AMPICILLIN <=2 SENSITIVE Sensitive     VANCOMYCIN 1 SENSITIVE Sensitive     GENTAMICIN SYNERGY RESISTANT Resistant     * FEW ENTEROCOCCUS FAECALIS   Klebsiella oxytoca - MIC*    AMPICILLIN >=32 RESISTANT Resistant     CEFAZOLIN 8 SENSITIVE Sensitive     CEFEPIME <=1 SENSITIVE Sensitive     CEFTAZIDIME <=1 SENSITIVE Sensitive     CEFTRIAXONE <=1 SENSITIVE Sensitive     CIPROFLOXACIN <=0.25 SENSITIVE Sensitive       GENTAMICIN <=1 SENSITIVE Sensitive     IMIPENEM <=0.25 SENSITIVE Sensitive     TRIMETH/SULFA <=20 SENSITIVE Sensitive     AMPICILLIN/SULBACTAM 16 INTERMEDIATE Intermediate     PIP/TAZO <=4 SENSITIVE Sensitive     Extended ESBL NEGATIVE Sensitive     * ABUNDANT KLEBSIELLA OXYTOCA  Blood culture (routine x 2)     Status: None (Preliminary result)   Collection Time: 10/06/16  4:58 PM  Result Value Ref Range Status   Specimen Description BLOOD LEFT FOREARM  Final   Special Requests   Final    BOTTLES DRAWN AEROBIC AND ANAEROBIC Blood Culture adequate volume   Culture NO GROWTH 2 DAYS  Final   Report Status PENDING  Incomplete  Blood culture (routine x 2)     Status: None (Preliminary result)   Collection Time: 10/06/16  6:55 PM  Result Value Ref Range Status   Specimen Description BLOOD LEFT ANTECUBITAL  Final   Special Requests   Final    BOTTLES DRAWN AEROBIC AND ANAEROBIC Blood Culture adequate volume   Culture NO GROWTH 2 DAYS  Final   Report Status PENDING  Incomplete  MRSA PCR Screening     Status: None   Collection Time: 10/07/16  3:15 PM  Result Value Ref Range Status   MRSA by PCR NEGATIVE NEGATIVE Final    Comment:        The GeneXpert MRSA Assay (FDA approved for NASAL specimens only), is one component of a comprehensive MRSA colonization surveillance program. It is not intended to diagnose MRSA infection nor to guide or monitor treatment for MRSA infections.          Radiology Studies: Dg Chest Port 1 View  Result Date: 10/09/2016 CLINICAL DATA:  83 year old male with a history of increased cough with yellow sputum EXAM: PORTABLE CHEST  1 VIEW COMPARISON:  09/24/2016, 09/12/2016 FINDINGS: Cardiomediastinal silhouette unchanged with borderline cardiomegaly. Surgical changes of median sternotomy and CABG. Calcifications of the aortic arch. Persisting opacity at the left base with partial obscuration left hemidiaphragm. Blunting of left costophrenic sulcus with  linear opacities. Improved appearance of interlobular septal thickening bilaterally. No pneumothorax. No large confluent airspace disease. IMPRESSION: Improving edema with persistent residual opacity at the left lung base, potentially combination of atelectasis/ consolidation and small pleural fluid. Surgical changes of prior median sternotomy and CABG. Electronically Signed   By: Corrie Mckusick D.O.   On: 10/09/2016 10:42      Scheduled Meds: . antiseptic oral rinse  15 mL Mouth Rinse QID  . chlorpheniramine-HYDROcodone  5 mL Oral Q12H  . cloNIDine  0.1 mg Oral BID  . docusate sodium  100 mg Oral BID  . feeding supplement (GLUCERNA SHAKE)  237 mL Oral BID BM  . feeding supplement (PRO-STAT SUGAR FREE 64)  30 mL Oral BID BM  . FLUoxetine  20 mg Oral Daily  . hydrocerin   Topical TID  . insulin aspart  0-9 Units Subcutaneous TID WC  . insulin glargine  10 Units Subcutaneous QHS  . lactobacillus acidophilus  1 tablet Oral BID  . metoprolol succinate  75 mg Oral Daily  . multivitamin with minerals  1 tablet Oral Daily  . pantoprazole  40 mg Oral Daily  . rosuvastatin  10 mg Oral QHS  . tamsulosin  0.4 mg Oral QHS   Continuous Infusions:    LOS: 3 days    Time spent in minutes: 35    Debbe Odea, MD [facility] Pager: [email or web address] Password [email or web address] 10/09/2016, 3:15 PM

## 2016-10-09 NOTE — Social Work (Signed)
CSW called Velva Harman, sister and discussed bed offers.  CSW explained SNF process. CSW advised that patient will probably dc 10/5 to SNF. Daughter would like to think about bed offers and discuss with CSW in the morning.  CSW will continue to follow up.  premature ventricular contractions

## 2016-10-09 NOTE — Clinical Social Work Note (Signed)
Clinical Social Work Assessment  Patient Details  Name: [name] MRN: [id number] Date of Birth: [birth year]  Date of referral:  10/09/16               Reason for consult:  Facility Placement                Permission sought to share information with:  Case Manager Permission granted to share information::  Yes, Verbal Permission Granted  Name::     Chiropractor::  SNF  Relationship::  daughter  Contact Information:     Housing/Transportation Living arrangements for the past 2 months:  Single Family Home, [place] of Information:  Patient, Engineer, materials, Adult Children Patient Interpreter Needed:  None Criminal Activity/Legal Involvement Pertinent to Current Situation/Hospitalization:  No - Comment as needed Significant Relationships:  Adult Children, Friend, Other Family Members Lives with:  Self Do you feel safe going back to the place where you live?  No Need for family participation in patient care:  Yes (Comment)  Care giving concerns:  [facility] and came to the emergency department from facility. Pt stepped on a nail and then his condition of his foot worsened. Pt was treated in ED and then discharged home, then returned home, and then returned back to ED and then discharged to SNF and then return to ED. Pt not safe to return at this time with new impairment. CSW agreeable with short term rehab.  Social Worker assessment / plan:  CSW met with patient and friend at bedside to discuss SNF recommendation.  CSW discussed process of SNF options/placement. Pt recommended CSW call daughter Velva Harman to discuss placement. Pt confirmed he has experience with SNF. CSW obtained permission to send out offers for SNF. FL2 complete. Offers sent. Passr confirmed.  Employment status:  Retired Nurse, adult PT Recommendations:  [place] / Referral to community resources:  [place]  Patient/Family's Response to care:  Patient appreciative of CSW f/u for SNF placement at discharge. No issues or concerns identified at this time.  Patient/Family's Understanding of and Emotional Response to Diagnosis, Current Treatment, and Prognosis:  Patient has some understanding of his diagnosis, current treatment and prognosis. Pt hopeful that he will improve with short term nursing therapy. No issues or concerns identified at this time.  Emotional Assessment Appearance:  Appears stated age Attitude/Demeanor/Rapport:   (Cooperative) Affect (typically observed):  Accepting, Appropriate Orientation:  Oriented to Self, Oriented to Place, Oriented to  Time Alcohol / Substance use:  Not Applicable Psych involvement (Current and /or in the community):  No (Comment)  Discharge Needs  Concerns to be addressed:  Care Coordination Readmission within the last 30 days:  Yes Current discharge risk:  Dependent with Mobility, Physical Impairment Barriers to Discharge:  No Barriers Identified   Normajean Baxter, LCSW 10/09/2016, 5:05 PM

## 2016-10-09 NOTE — Care Management Note (Signed)
Case Management Note  Patient Details  Name: [name] MRN: [id number] Date of Birth: 09-14-1931  Subjective/Objective:    CM following for progression and d/c planning.                 Action/Plan: 10/09/2016 Noted PT recommending CIR, also aware that no beds are currently available and pt will be ready for d/c on 10/10/2016 most likely No beds on CIR this weekend, this CM contacted [place] and ask her to begin a SNF bed search.   Expected Discharge Date:                  Expected Discharge Plan:  Skilled Nursing Facility  In-House Referral:  Clinical Social Work  Discharge planning Services  CM Consult  Post Acute Care Choice:    Choice offered to:     DME Arranged:    DME Agency:     HH Arranged:    [place] Agency:     Status of Service:  In process, will continue to follow  If discussed at Long Length of Stay Meetings, dates discussed:    Additional Comments:  Adron Bene, RN 10/09/2016, 1:04 PM

## 2016-10-09 NOTE — Evaluation (Addendum)
Physical Therapy Evaluation Patient Details Name: [name] MRN: [id number] DOB: May 29, 1931 Today's Date: 10/09/2016   History of Present Illness  Pt is a 83 y/o male s/p L BKA. Pt has a past medical history including BPH; CAD; Cellulitis; Chronic back pain; Chronic diastolic CHF; CKD; COPD ; Essential hypertension; Gastric mass; GERD; History of DVT; HOH; CABG; Hyperlipidemia; Persistent atrial fibrillation; Sleep apnea; Type 2 DM; Coronary artery bypass graft (2011); Cardioversion (03/20/2016).  Clinical Impression  Patient presents with decreased independence with mobility due to weakness, decreased endurance, new L BKA and poor safety awareness.  Feel he will benefit from skilled PT in the acute setting and will need SNF level rehab at d/c due to no home support.     Follow Up Recommendations SNF    Equipment Recommendations  Wheelchair (measurements PT);Wheelchair cushion (measurements PT)    Recommendations for Other Services       Precautions / Restrictions Precautions Precautions: Fall Precaution Comments: new L BKA Required Braces or Orthoses: Knee Immobilizer - Left Restrictions LLE Weight Bearing: Non weight bearing      Mobility  Bed Mobility Overal bed mobility: Needs Assistance Bed Mobility: Supine to Sit     Supine to sit: HOB elevated;Min guard     General bed mobility comments: heavy use of railing, increased time, assist for support to trunk as lifting up   Transfers Overall transfer level: Needs assistance   Transfers: Sit to/from Stand;Stand Pivot Transfers Sit to Stand: Mod assist;+2 safety/equipment Stand pivot transfers: Min assist;+2 safety/equipment       General transfer comment: stood to walker, but immediately reached for chair and pivoted with one hand on walker, one on chair with min A for safety  Ambulation/Gait                Stairs            Wheelchair Mobility    Modified Rankin (Stroke Patients Only)        Balance Overall balance assessment: Needs assistance Sitting-balance support: Feet supported;No upper extremity supported Sitting balance-Leahy Scale: Good     Standing balance support: Bilateral upper extremity supported Standing balance-Leahy Scale: Poor Standing balance comment: did not stand completely up due to reaching for chair                             Pertinent Vitals/Pain Pain Assessment: Faces Faces Pain Scale: Hurts a little bit Pain Location: Left Leg Pain Descriptors / Indicators: Discomfort;Dull Pain Intervention(s): Monitored during session;Repositioned    Home Living Family/patient expects to be discharged to:: Skilled nursing facility Living Arrangements: Alone Available Help at Discharge: Family;Available PRN/intermittently Type of Home: Mobile home Home Access: Stairs to enter Entrance Stairs-Rails: Can reach both   Home Layout: One level        Prior Function Level of Independence: Independent               Hand Dominance   Dominant Hand: Right    Extremity/Trunk Assessment   Upper Extremity Assessment Upper Extremity Assessment: Overall WFL for tasks assessed    Lower Extremity Assessment Lower Extremity Assessment: LLE deficits/detail LLE Deficits / Details: ne L BKA in immobilizer able to lift off bed unaided       Communication   Communication: HOH  Cognition Arousal/Alertness: Awake/alert Behavior During Therapy: WFL for tasks assessed/performed Overall Cognitive Status: No family/caregiver present to determine baseline cognitive functioning  General Comments      Exercises     Assessment/Plan    PT Assessment Patient needs continued PT services  PT Problem List Decreased strength;Decreased mobility;Decreased safety awareness;Decreased activity tolerance;Decreased balance;Decreased knowledge of use of DME;Pain;Decreased knowledge of precautions        PT Treatment Interventions DME instruction;Therapeutic activities;Gait training;Therapeutic exercise;Patient/family education;Balance training;Functional mobility training;Wheelchair mobility training    PT Goals (Current goals can be found in the Care Plan section)  Acute Rehab PT Goals Patient Stated Goal: go to rehab then go home PT Goal Formulation: With patient Time For Goal Achievement: 10/23/16 Potential to Achieve Goals: Good Additional Goals Additional Goal #1: Patient to propel w/c 100' S on level indoor surface.     Frequency Min 3X/week   Barriers to discharge Decreased caregiver support lives alone    Co-evaluation               AM-PAC PT "6 Clicks" Daily Activity  Outcome Measure Difficulty turning over in bed (including adjusting bedclothes, sheets and blankets)?: A Little Difficulty moving from lying on back to sitting on the side of the bed? : Unable Difficulty sitting down on and standing up from a chair with arms (e.g., wheelchair, bedside commode, etc,.)?: Unable Help needed moving to and from a bed to chair (including a wheelchair)?: A Little Help needed walking in hospital room?: A Lot Help needed climbing 3-5 steps with a railing? : Total 6 Click Score: 11    End of Session Equipment Utilized During Treatment: Gait belt;Oxygen Activity Tolerance: Patient tolerated treatment well Patient left: in chair;with chair alarm set   PT Visit Diagnosis: Other abnormalities of gait and mobility (R26.89);Muscle weakness (generalized) (M62.81);Unsteadiness on feet (R26.81)    Time: 0347-4259 PT Time Calculation (min) (ACUTE ONLY): 23 min   Charges:   PT Evaluation $PT Eval Moderate Complexity: 1 Mod PT Treatments $Therapeutic Activity: 8-22 mins   PT G CodesMagda Kiel, [place] [phone] 10/09/2016   [name] 10/09/2016, 10:49 AM

## 2016-10-09 NOTE — Progress Notes (Signed)
Subjective: Left LE doing ok.  Pain controlled.  Patient c/o chronic dysphagia with solids.  Had barium swallow 06 Oct 2016.    Objective: Vital signs in last 24 hours: Temp:  [97.4 F (36.3 C)-98.6 F (37 C)] 97.4 F (36.3 C) (10/05 0858) Pulse Rate:  [74-97] 97 (10/05 0900) Resp:  [18-20] 18 (10/05 0858) BP: (143-159)/(65-84) 159/80 (10/05 0858) SpO2:  [99 %-100 %] 99 % (10/05 0858)  Intake/Output from previous day: 10/04 0701 - 10/05 0700 In: 0  Out: 900 [Urine:900] Intake/Output this shift: Total I/O In: 120 [P.O.:120] Out: 0    Recent Labs  10/06/16 1658 10/07/16 0205 10/08/16 0520  HGB 8.4* 7.4* 8.7*    Recent Labs  10/07/16 0205 10/07/16 0959 10/08/16 0520  WBC 8.1  --  7.2  RBC 2.72* 3.29* 3.23*  HCT 23.4*  --  27.7*  PLT 194  --  185    Recent Labs  10/07/16 0205 10/08/16 0520  NA 136 138  K 4.1 4.5  CL 95* 99*  CO2 30 25  BUN 29* 24*  CREATININE 2.01* 2.05*  GLUCOSE 176* 220*  CALCIUM 9.9 9.9   No results for input(s): LABPT, INR in the last 72 hours.  Exam: Pleasant elderly WM.  NAD.  Dressing left left LE c/d/i.   Assessment/Plan: Stable from ortho standpoint.  PT recommending SNF.  Will let medical service address chronic issues with dysphagia.     [name] 10/09/2016, 11:53 AM

## 2016-10-09 NOTE — NC FL2 (Signed)
[place] MEDICAID FL2 LEVEL OF CARE SCREENING TOOL     IDENTIFICATION  Patient Name: [name] Birthdate: [birth year] Sex: male Admission Date (Current Location): 10/06/2016  [facility] and [place] Number:  Herbalist and Address:  [facility], [place] [address], [place], [place] [address]      Provider Number: [id number]  Attending Physician Name and Address:  Debbe Odea, MD  Relative Name and Phone Number:  Christell Constant, [phone]    Current Level of Care: Hospital Recommended Level of Care: [place] Prior Approval Number:    Date Approved/Denied: 10/09/16 PASRR Number: [id number]  Discharge Plan: SNF    Current Diagnoses: Patient Active Problem List   Diagnosis Date Noted  . Osteomyelitis of left foot ([place]) 10/06/2016  . Acute osteomyelitis of left foot ([place]) 10/06/2016  . CHF exacerbation ([place]) 09/24/2016  . Cellulitis and abscess of foot   . HCAP (healthcare-associated pneumonia) 09/09/2016  . COPD (chronic obstructive pulmonary disease) ([place]) 09/09/2016  . CAD (coronary artery disease) 09/09/2016  . Hyperlipidemia 09/09/2016  . Diabetic foot infection ([place]) 09/01/2016  . Type 2 diabetes mellitus ([place]) 07/24/2016  . Pressure injury of skin 06/11/2016  . Atrial fibrillation with normal ventricular rate ([place]) 06/10/2016  . Esophageal stricture 06/09/2016  . CAD in native artery 06/08/2016  . Acute renal failure superimposed on stage 3 chronic kidney disease ([place]) 06/08/2016  . Chronic diastolic CHF (congestive heart failure) ([place]) 03/19/2016  . CAP (community acquired pneumonia) 03/19/2016  . Abnormal weight loss   . Thrush, oral 12/18/2013  . Chest pain at rest 12/18/2013  . Leukocytosis 12/18/2013  . Diabetes ([place]) 12/18/2013  . Chronic back pain 12/18/2013  . Dysphagia 12/18/2013  . Odynophagia 12/18/2013  . Gastric mass 09/07/2013  . Personal history of colonic polyps 08/05/2013  . Nausea and  vomiting 08/01/2013  . UTI (lower urinary tract infection) 12/09/2012  . Lower extremity edema 06/02/2012  . Essential hypertension 06/02/2012  . History of DVT (deep vein thrombosis) 06/02/2012  . Obesity (BMI 30-39.9): BMI 31.3 06/02/2012    Orientation RESPIRATION BLADDER Height & Weight     Self, Place, Situation  O2 (Nasal Cannula 2L) External catheter Weight: 198 lb (89.8 kg) Height:  5\' 8"  (172.7 cm)  BEHAVIORAL SYMPTOMS/MOOD NEUROLOGICAL BOWEL NUTRITION STATUS      Continent Diet (See Dc Summary)  AMBULATORY STATUS COMMUNICATION OF NEEDS Skin   Limited Assist Verbally PU Stage and Appropriate Care, Surgical wounds PU Stage 1 Dressing: Daily   PU Stage 3 Dressing: Daily                 Personal Care Assistance Level of Assistance  Dressing, Feeding, Bathing Bathing Assistance: Limited assistance Feeding assistance: Limited assistance Dressing Assistance: Limited assistance     Functional Limitations Info             SPECIAL CARE FACTORS FREQUENCY  PT (By licensed PT), OT (By licensed OT)     PT Frequency: 3x week OT Frequency: 2x week            Contractures Contractures Info: Not present    Additional Factors Info  Code Status, Allergies, Insulin Sliding Scale Code Status Info: DNR Allergies Info: No Known Allergies   Insulin Sliding Scale Info: Insulin daily       Current Medications (10/09/2016):  This is the current hospital active medication list Current Facility-Administered Medications  Medication Dose Route Frequency Provider Last Rate Last Dose  .  acetaminophen (TYLENOL) tablet 650 mg  650 mg Oral Q6H PRN Lanae Crumbly, PA-C       Or  . acetaminophen (TYLENOL) suppository 650 mg  650 mg Rectal Q6H PRN Lanae Crumbly, PA-C      . antiseptic oral rinse (BIOTENE) solution 15 mL  15 mL Mouth Rinse QID Jennette Kettle M, DO   15 mL at 10/08/16 2122  . chlorpheniramine-HYDROcodone (TUSSIONEX) 10-8 MG/5ML suspension 5 mL  5 mL Oral Q12H  Debbe Odea, MD   5 mL at 10/09/16 1202  . cloNIDine (CATAPRES) tablet 0.1 mg  0.1 mg Oral BID Debbe Odea, MD   0.1 mg at 10/09/16 1201  . docusate sodium (COLACE) capsule 100 mg  100 mg Oral BID Etta Quill, DO   100 mg at 10/09/16 1202  . feeding supplement (GLUCERNA SHAKE) (GLUCERNA SHAKE) liquid 237 mL  237 mL Oral BID BM Jennette Kettle M, DO   237 mL at 10/09/16 1253  . feeding supplement (PRO-STAT SUGAR FREE 64) liquid 30 mL  30 mL Oral BID BM Jennette Kettle M, DO   30 mL at 10/09/16 1203  . FLUoxetine (PROZAC) capsule 20 mg  20 mg Oral Daily Jennette Kettle M, DO   20 mg at 10/09/16 1201  . hydrocerin (EUCERIN) cream   Topical TID Schorr, Rhetta Mura, NP      . ibuprofen (ADVIL,MOTRIN) tablet 400 mg  400 mg Oral QID PRN Debbe Odea, MD   400 mg at 10/09/16 8119  . insulin aspart (novoLOG) injection 0-9 Units  0-9 Units Subcutaneous TID WC Debbe Odea, MD   3 Units at 10/09/16 1253  . insulin glargine (LANTUS) injection 10 Units  10 Units Subcutaneous QHS Etta Quill, DO   10 Units at 10/08/16 2140  . ipratropium-albuterol (DUONEB) 0.5-2.5 (3) MG/3ML nebulizer solution 3 mL  3 mL Nebulization Q6H PRN Etta Quill, DO      . lactobacillus acidophilus (BACID) tablet 1 tablet  1 tablet Oral BID Etta Quill, DO   1 tablet at 10/09/16 1202  . LORazepam (ATIVAN) tablet 0.5 mg  0.5 mg Oral QHS PRN Debbe Odea, MD   0.5 mg at 10/08/16 2113  . metoCLOPramide (REGLAN) tablet 5-10 mg  5-10 mg Oral Q8H PRN Lanae Crumbly, PA-C       Or  . metoCLOPramide (REGLAN) injection 5-10 mg  5-10 mg Intravenous Q8H PRN [name] M, PA-C      . metoprolol succinate (TOPROL-XL) 24 hr tablet 75 mg  75 mg Oral Daily Debbe Odea, MD   75 mg at 10/09/16 1201  . multivitamin with minerals tablet 1 tablet  1 tablet Oral Daily Debbe Odea, MD   1 tablet at 10/09/16 1202  . ondansetron (ZOFRAN) tablet 4 mg  4 mg Oral Q6H PRN Etta Quill, DO       Or  . ondansetron [facility]) injection  4 mg  4 mg Intravenous Q6H PRN Etta Quill, DO   4 mg at 10/07/16 2113  . oxyCODONE (Oxy IR/ROXICODONE) immediate release tablet 5 mg  5 mg Oral Q4H PRN Lanae Crumbly, PA-C   5 mg at 10/09/16 1212  . pantoprazole (PROTONIX) EC tablet 40 mg  40 mg Oral Daily Jennette Kettle M, DO   40 mg at 10/09/16 1202  . rosuvastatin (CRESTOR) tablet 10 mg  10 mg Oral QHS Jennette Kettle M, DO   10 mg at 10/08/16 2118  . tamsulosin (FLOMAX) capsule  0.4 mg  0.4 mg Oral QHS Jennette Kettle M, DO   0.4 mg at 10/08/16 2118     Discharge Medications: Please see discharge summary for a list of discharge medications.  Relevant Imaging Results:  Relevant Lab Results:   Additional Information SS#: [place], LCSW

## 2016-10-09 NOTE — Progress Notes (Signed)
Inpatient Diabetes Program Recommendations  AACE/ADA: New Consensus Statement on Inpatient Glycemic Control (2015)  Target Ranges:  Prepandial:   less than 140 mg/dL      Peak postprandial:   less than 180 mg/dL (1-2 hours)      Critically ill patients:  140 - 180 mg/dL   Lab Results  Component Value Date   GLUCAP 238 (H) 10/09/2016   HGBA1C 8.5 (H) 09/24/2016    Review of Glycemic Control Results for [name], [name] (MRN [id number]) as of 10/09/2016 14:33  Ref. Range 10/08/2016 08:44 10/08/2016 12:37 10/08/2016 17:25 10/08/2016 21:40 10/09/2016 08:22 10/09/2016 12:18  Glucose-Capillary Latest Ref Range: 65 - 99 mg/dL 191 (H) 186 (H) 255 (H) 199 (H) 176 (H) 238 (H)   Diabetes history: DM Outpatient Diabetes medications: Lantus 30 units q HS Current orders for Inpatient glycemic control:  Lantus 10 units q HS, Novolog sensitive tid with meals  Inpatient Diabetes Program Recommendations:   Please consider increasing Lantus to 15 units daily.  Also if appropriate, consider adding Novolog meal coverage 3 units tid with meals.   Thanks, Adah Perl, RN, BC-ADM Inpatient Diabetes Coordinator Pager [phone] (8a-5p)

## 2016-10-09 NOTE — Progress Notes (Signed)
Physical medicine rehabilitation consult requested chart reviewed.Patient presently resides in a skilled nursing facility after recent discharge in early September. Plan is to return to skilled nursing facility as patient limited resources and assistance at home. Recommendations by physical therapy are for skilled nursing facility. Hold on formal rehabilitation consult at this time with plan to return to skilled nursing facility.

## 2016-10-10 DIAGNOSIS — R05 Cough: Secondary | ICD-10-CM

## 2016-10-10 DIAGNOSIS — E1100 Type 2 diabetes mellitus with hyperosmolarity without nonketotic hyperglycemic-hyperosmolar coma (NKHHC): Secondary | ICD-10-CM

## 2016-10-10 LAB — BASIC METABOLIC PANEL
Anion gap: 8 (ref 5–15)
BUN: 23 mg/dL — AB (ref 6–20)
CHLORIDE: 98 mmol/L — AB (ref 101–111)
CO2: 31 mmol/L (ref 22–32)
CREATININE: 1.48 mg/dL — AB (ref 0.61–1.24)
Calcium: 9.9 mg/dL (ref 8.9–10.3)
GFR calc Af Amer: 49 mL/min — ABNORMAL LOW (ref >60)
GFR calc non Af Amer: 42 mL/min — ABNORMAL LOW (ref >60)
GLUCOSE: 208 mg/dL — AB (ref 65–99)
POTASSIUM: 4 mmol/L (ref 3.5–5.1)
Sodium: 137 mmol/L (ref 135–145)

## 2016-10-10 LAB — GLUCOSE, CAPILLARY
Glucose-Capillary: 179 mg/dL — ABNORMAL HIGH (ref 65–99)
Glucose-Capillary: 225 mg/dL — ABNORMAL HIGH (ref 65–99)

## 2016-10-10 MED ORDER — INSULIN ASPART 100 UNIT/ML ~~LOC~~ SOLN: 2.0000 [IU] | Freq: Three times a day (TID) | SUBCUTANEOUS | 11 refills | Status: DC

## 2016-10-10 MED ORDER — IRBESARTAN 75 MG PO TABS: 75.0000 mg | ORAL_TABLET | Freq: Every day | ORAL | 5 refills | Status: DC

## 2016-10-10 MED ORDER — OXYCODONE HCL 15 MG PO TABS: 15.0000 mg | ORAL_TABLET | Freq: Three times a day (TID) | ORAL | 0 refills | Status: DC | PRN

## 2016-10-10 MED ORDER — INSULIN GLARGINE 100 UNIT/ML ~~LOC~~ SOLN: 10.0000 [IU] | Freq: Every day | SUBCUTANEOUS | 11 refills | Status: DC

## 2016-10-10 MED ORDER — FUROSEMIDE 40 MG PO TABS: 40.0000 mg | ORAL_TABLET | Freq: Every day | ORAL | Status: DC | PRN

## 2016-10-10 MED ORDER — POTASSIUM CHLORIDE CRYS ER 20 MEQ PO TBCR: 20.0000 meq | EXTENDED_RELEASE_TABLET | Freq: Every day | ORAL | 0 refills | Status: DC | PRN

## 2016-10-10 NOTE — Social Work (Signed)
Clinical Social Worker facilitated patient discharge including contacting patient family and facility to confirm patient discharge plans.  Clinical information faxed to facility and family agreeable with plan.    CSW arranged ambulance transport via [facility] to [facility] [place].    RN to call [phone] to give report prior to discharge.  Clinical Social Worker will sign off for now as social work intervention is no longer needed. Please consult us again if new need arises.  Tucker Minter, LCSW Clinical Social Worker [phone]    

## 2016-10-10 NOTE — Clinical Social Work Note (Signed)
CSW met with pt/friend @ bedside to discuss bed offers. Pt called dtr Velva Harman [phone], Velva Harman would like pt to go to [facility] [place]. CSW contacted facility to accept bed offer, awaiting callback from DON.  Following for DC planning.  Travis Mastel B. Joline Maxcy Clinical Social Work Dept Weekend Social Worker [phone] 11:27 AM

## 2016-10-10 NOTE — Clinical Social Work Placement (Signed)
   CLINICAL SOCIAL WORK PLACEMENT  NOTE  Date:  10/10/2016  Patient Details  Name: [name] MRN: [id number] Date of Birth: Feb 19, 1931  Clinical Social Work is seeking post-discharge placement for this patient at the [place] level of care (*CSW will initial, date and re-position this form in  chart as items are completed):  Yes   Patient/family provided with [place] Work Department's list of facilities offering this level of care within the geographic area requested by the patient (or if unable, by the patient's family).  Yes   Patient/family informed of their freedom to choose among providers that offer the needed level of care, that participate in Medicare, Medicaid or managed care program needed by the patient, have an available bed and are willing to accept the patient.  Yes   Patient/family informed of [facility]'s ownership interest in [facility] and [facility], as well as of the fact that they are under no obligation to receive care at these facilities.  PASRR submitted to EDS on       PASRR number received on       Existing PASRR number confirmed on 10/09/16     FL2 transmitted to all facilities in geographic area requested by pt/family on       FL2 transmitted to all facilities within larger geographic area on 10/09/16     Patient informed that his/her managed care company has contracts with or will negotiate with certain facilities, including the following:            Patient/family informed of bed offers received.  Patient chooses bed at [place] [facility]     Physician recommends and patient chooses bed at      Patient to be transferred to [facility] [place] on 10/10/16.  Patient to be transferred to facility by [facility]     Patient family notified on 10/10/16 of transfer.  Name of family member notified:  daughter advised of transport     PHYSICIAN Please prepare prescriptions, Please prepare priority  discharge summary, including medications     Additional Comment:    _______________________________________________ Normajean Baxter, LCSW 10/10/2016, 12:10 PM

## 2016-10-10 NOTE — Discharge Summary (Signed)
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 phasicity and response to augmentation. Axillary Vein: No evidence of thrombus. Normal compressibility, respiratory phasicity and response to augmentation. Cephalic Vein: No evidence of thrombus. Normal compressibility, respiratory phasicity and response to augmentation. Basilic Vein: No evidence of thrombus. Normal compressibility, respiratory phasicity and response to augmentation. Brachial Veins: No evidence of thrombus. Normal compressibility, respiratory phasicity and response to augmentation. Radial Veins: No evidence of thrombus. Normal compressibility, respiratory phasicity and response to augmentation. Ulnar Veins: No evidence of thrombus. Normal compressibility, respiratory phasicity and response to augmentation. Venous Reflux:  None visualized. Other Findings: PICC line in place. No thrombus seen along the course of the PICC line. Ill-defined edema within the subcutaneous soft tissues of the right arm. IMPRESSION: No evidence of DVT within the right upper extremity. Ill-defined edema within the subcutaneous soft tissues. Electronically Signed   By: Franki Cabot  M.D.   On: 09/13/2016 13:18   Dg Chest Port 1 View  Result Date: 10/09/2016 CLINICAL DATA:  83 year old male with a history of increased cough with yellow sputum EXAM: PORTABLE CHEST 1 VIEW COMPARISON:  09/24/2016, 09/12/2016 FINDINGS: Cardiomediastinal silhouette unchanged  with borderline cardiomegaly. Surgical changes of median sternotomy and CABG. Calcifications of the aortic arch. Persisting opacity at the left base with partial obscuration left hemidiaphragm. Blunting of left costophrenic sulcus with linear opacities. Improved appearance of interlobular septal thickening bilaterally. No pneumothorax. No large confluent airspace disease. IMPRESSION: Improving edema with persistent residual opacity at the left lung base, potentially combination of atelectasis/ consolidation and small pleural fluid. Surgical changes of prior median sternotomy and CABG. Electronically Signed   By: Corrie Mckusick D.O.   On: 10/09/2016 10:42   Dg Chest Port 1 View  Result Date: 09/12/2016 CLINICAL DATA:  PICC line placement. EXAM: PORTABLE CHEST 1 VIEW COMPARISON:  Chest x-ray dated 09/09/2016. FINDINGS: Right-sided PICC line in place. Tip appears stable in position at the level of the mid SVC. Heart size and mediastinal contours are stable. Opacity at the left lung base appears stable, likely atelectasis and/or small effusion. No new lung findings. IMPRESSION: PICC line appears adequately positioned at the level of the mid SVC. Electronically Signed   By: Franki Cabot M.D.   On: 09/12/2016 13:04       Discharge Exam: Vitals:   10/10/16 0543 10/10/16 0747  BP: (!) 164/67 (!) 148/63  Pulse: 70 69  Resp: 18 20  Temp: 98.3 F (36.8 C) 97.6 F (36.4 C)  SpO2: 96% 98%   Vitals:   10/09/16 1827 10/09/16 2021 10/10/16 0543 10/10/16 0747  BP: (!) 164/82 128/75 (!) 164/67 (!) 148/63  Pulse: 80 87 70 69  Resp: 18 18 18 20   Temp: (!) 97 F (36.1 C) 98.2 F (36.8 C) 98.3 F (36.8 C) 97.6 F (36.4 C)  TempSrc:  Oral Oral Oral Oral  SpO2: 99% 96% 96% 98%  Weight:  91.9 kg (202 lb 11.2 oz)    Height:        General: Pt is alert, awake, not in acute distress Cardiovascular: RRR, S1/S2 +, no rubs, no gallops Respiratory: CTA bilaterally, no wheezing, no rhonchi Abdominal: Soft, NT, ND, bowel sounds + Extremities: no edema, no cyanosis    The results of significant diagnostics from this hospitalization (including imaging, microbiology, ancillary and laboratory) are listed below for reference.     Microbiology: Recent Results (from the past 240 hour(s))  Aerobic/Anaerobic Culture (surgical/deep wound)     Status: None   Collection Time: 10/01/16  3:30 PM  Result Value Ref Range Status   Specimen Description FOOT LEFT  Final   Special Requests NONE  Final   Gram Stain   Final    RARE WBC PRESENT, PREDOMINANTLY PMN MODERATE GRAM POSITIVE COCCI IN PAIRS FEW GRAM NEGATIVE COCCOBACILLI    Culture   Final    ABUNDANT KLEBSIELLA OXYTOCA FEW ENTEROCOCCUS FAECALIS NO ANAEROBES ISOLATED Performed at [facility], [address] [place], [place] [address]    Report Status 10/07/2016 FINAL  Final   Organism ID, Bacteria KLEBSIELLA OXYTOCA  Final   Organism ID, Bacteria ENTEROCOCCUS FAECALIS  Final      Susceptibility   Enterococcus faecalis - MIC*    AMPICILLIN <=2 SENSITIVE Sensitive     VANCOMYCIN 1 SENSITIVE Sensitive     GENTAMICIN SYNERGY RESISTANT Resistant     * FEW ENTEROCOCCUS FAECALIS   Klebsiella oxytoca - MIC*    AMPICILLIN >=32 RESISTANT Resistant     CEFAZOLIN 8 SENSITIVE Sensitive     CEFEPIME <=1 SENSITIVE Sensitive     CEFTAZIDIME <=1 SENSITIVE Sensitive     CEFTRIAXONE <=1 SENSITIVE Sensitive  CIPROFLOXACIN <=0.25 SENSITIVE Sensitive     GENTAMICIN <=1 SENSITIVE Sensitive     IMIPENEM <=0.25 SENSITIVE Sensitive     TRIMETH/SULFA <=20 SENSITIVE Sensitive     AMPICILLIN/SULBACTAM 16 INTERMEDIATE Intermediate     PIP/TAZO <=4 SENSITIVE Sensitive     Extended  ESBL NEGATIVE Sensitive     * ABUNDANT KLEBSIELLA OXYTOCA  Blood culture (routine x 2)     Status: None (Preliminary result)   Collection Time: 10/06/16  4:58 PM  Result Value Ref Range Status   Specimen Description BLOOD LEFT FOREARM  Final   Special Requests   Final    BOTTLES DRAWN AEROBIC AND ANAEROBIC Blood Culture adequate volume   Culture NO GROWTH 3 DAYS  Final   Report Status PENDING  Incomplete  Blood culture (routine x 2)     Status: None (Preliminary result)   Collection Time: 10/06/16  6:55 PM  Result Value Ref Range Status   Specimen Description BLOOD LEFT ANTECUBITAL  Final   Special Requests   Final    BOTTLES DRAWN AEROBIC AND ANAEROBIC Blood Culture adequate volume   Culture NO GROWTH 3 DAYS  Final   Report Status PENDING  Incomplete  MRSA PCR Screening     Status: None   Collection Time: 10/07/16  3:15 PM  Result Value Ref Range Status   MRSA by PCR NEGATIVE NEGATIVE Final    Comment:        The GeneXpert MRSA Assay (FDA approved for NASAL specimens only), is one component of a comprehensive MRSA colonization surveillance program. It is not intended to diagnose MRSA infection nor to guide or monitor treatment for MRSA infections.      Labs: BNP (last 3 results)  Recent Labs  07/24/16 1006 09/09/16 2115 09/24/16 0021  BNP 251.0* 404.0* 086.5*   Basic Metabolic Panel:  Recent Labs Lab 10/06/16 1658 10/07/16 0205 10/08/16 0520 10/10/16 0311  NA 132* 136 138 137  K 4.8 4.1 4.5 4.0  CL 94* 95* 99* 98*  CO2 32 30 25 31   GLUCOSE 213* 176* 220* 208*  BUN 34* 29* 24* 23*  CREATININE 2.09* 2.01* 2.05* 1.48*  CALCIUM 10.2 9.9 9.9 9.9   Liver Function Tests:  Recent Labs Lab 10/06/16 1658  AST 28  ALT 20  ALKPHOS 101  BILITOT 0.8  PROT 6.6  ALBUMIN 3.2*   No results for input(s): LIPASE, AMYLASE in the last 168 hours. No results for input(s): AMMONIA in the last 168 hours. CBC:  Recent Labs Lab 10/06/16 1658 10/07/16 0205  10/08/16 0520  WBC 8.5 8.1 7.2  NEUTROABS 6.5  --   --   HGB 8.4* 7.4* 8.7*  HCT 26.7* 23.4* 27.7*  MCV 86.1 86.0 85.8  PLT 229 194 185   Cardiac Enzymes: No results for input(s): CKTOTAL, CKMB, CKMBINDEX, TROPONINI in the last 168 hours. BNP: Invalid input(s): POCBNP CBG:  Recent Labs Lab 10/09/16 0822 10/09/16 1218 10/09/16 1701 10/09/16 2020 10/10/16 0745  GLUCAP 176* 238* 268* 232* 179*   D-Dimer No results for input(s): DDIMER in the last 72 hours. Hgb A1c No results for input(s): HGBA1C in the last 72 hours. Lipid Profile No results for input(s): CHOL, HDL, LDLCALC, TRIG, CHOLHDL, LDLDIRECT in the last 72 hours. Thyroid function studies No results for input(s): TSH, T4TOTAL, T3FREE, THYROIDAB in the last 72 hours.  Invalid input(s): FREET3 Anemia work up No results for input(s): VITAMINB12, FOLATE, FERRITIN, TIBC, IRON, RETICCTPCT in the last 72 hours. Urinalysis  Component Value Date/Time   COLORURINE YELLOW 09/29/2016 0500   APPEARANCEUR CLEAR 09/29/2016 0500   LABSPEC 1.011 09/29/2016 0500   PHURINE 5.0 09/29/2016 0500   GLUCOSEU 50 (A) 09/29/2016 0500   HGBUR NEGATIVE 09/29/2016 0500   BILIRUBINUR NEGATIVE 09/29/2016 0500   KETONESUR NEGATIVE 09/29/2016 0500   PROTEINUR 100 (A) 09/29/2016 0500   UROBILINOGEN 0.2 12/19/2013 1905   NITRITE NEGATIVE 09/29/2016 0500   LEUKOCYTESUR NEGATIVE 09/29/2016 0500   Sepsis Labs Invalid input(s): PROCALCITONIN,  WBC,  LACTICIDVEN Microbiology Recent Results (from the past 240 hour(s))  Aerobic/Anaerobic Culture (surgical/deep wound)     Status: None   Collection Time: 10/01/16  3:30 PM  Result Value Ref Range Status   Specimen Description FOOT LEFT  Final   Special Requests NONE  Final   Gram Stain   Final    RARE WBC PRESENT, PREDOMINANTLY PMN MODERATE GRAM POSITIVE COCCI IN PAIRS FEW GRAM NEGATIVE COCCOBACILLI    Culture   Final    ABUNDANT KLEBSIELLA OXYTOCA FEW ENTEROCOCCUS FAECALIS NO ANAEROBES  ISOLATED Performed at [facility], [place] [address], [place], [place] [address]    Report Status 10/07/2016 FINAL  Final   Organism ID, Bacteria KLEBSIELLA OXYTOCA  Final   Organism ID, Bacteria ENTEROCOCCUS FAECALIS  Final      Susceptibility   Enterococcus faecalis - MIC*    AMPICILLIN <=2 SENSITIVE Sensitive     VANCOMYCIN 1 SENSITIVE Sensitive     GENTAMICIN SYNERGY RESISTANT Resistant     * FEW ENTEROCOCCUS FAECALIS   Klebsiella oxytoca - MIC*    AMPICILLIN >=32 RESISTANT Resistant     CEFAZOLIN 8 SENSITIVE Sensitive     CEFEPIME <=1 SENSITIVE Sensitive     CEFTAZIDIME <=1 SENSITIVE Sensitive     CEFTRIAXONE <=1 SENSITIVE Sensitive     CIPROFLOXACIN <=0.25 SENSITIVE Sensitive     GENTAMICIN <=1 SENSITIVE Sensitive     IMIPENEM <=0.25 SENSITIVE Sensitive     TRIMETH/SULFA <=20 SENSITIVE Sensitive     AMPICILLIN/SULBACTAM 16 INTERMEDIATE Intermediate     PIP/TAZO <=4 SENSITIVE Sensitive     Extended ESBL NEGATIVE Sensitive     * ABUNDANT KLEBSIELLA OXYTOCA  Blood culture (routine x 2)     Status: None (Preliminary result)   Collection Time: 10/06/16  4:58 PM  Result Value Ref Range Status   Specimen Description BLOOD LEFT FOREARM  Final   Special Requests   Final    BOTTLES DRAWN AEROBIC AND ANAEROBIC Blood Culture adequate volume   Culture NO GROWTH 3 DAYS  Final   Report Status PENDING  Incomplete  Blood culture (routine x 2)     Status: None (Preliminary result)   Collection Time: 10/06/16  6:55 PM  Result Value Ref Range Status   Specimen Description BLOOD LEFT ANTECUBITAL  Final   Special Requests   Final    BOTTLES DRAWN AEROBIC AND ANAEROBIC Blood Culture adequate volume   Culture NO GROWTH 3 DAYS  Final   Report Status PENDING  Incomplete  MRSA PCR Screening     Status: None   Collection Time: 10/07/16  3:15 PM  Result Value Ref Range Status   MRSA by PCR NEGATIVE NEGATIVE Final    Comment:        The GeneXpert MRSA Assay (FDA approved for NASAL  specimens only), is one component of a comprehensive MRSA colonization surveillance program. It is not intended to diagnose MRSA infection nor to guide or monitor treatment for MRSA infections.  Time coordinating discharge: Over 30 minutes  SIGNED:   Debbe Odea, MD  [facility] 10/10/2016, 12:01 PM Pager   If 7PM-7AM, please contact night-coverage [email or web address] Password [email or web address]

## 2016-10-11 LAB — CULTURE, BLOOD (ROUTINE X 2)
CULTURE: NO GROWTH
Culture: NO GROWTH
SPECIAL REQUESTS: ADEQUATE
SPECIAL REQUESTS: ADEQUATE

## 2016-10-11 NOTE — Anesthesia Postprocedure Evaluation (Signed)
Anesthesia Post Note  Patient: [name]  Procedure(s) Performed: AMPUTATION BELOW KNEE (Left Leg Lower)     Patient location during evaluation: PACU Anesthesia Type: General Level of consciousness: awake and alert Pain management: pain level controlled Vital Signs Assessment: post-procedure vital signs reviewed and stable Respiratory status: spontaneous breathing, nonlabored ventilation, respiratory function stable and patient connected to nasal cannula oxygen Cardiovascular status: blood pressure returned to baseline and stable Postop Assessment: no apparent nausea or vomiting Anesthetic complications: no    Last Vitals:  Vitals:   10/10/16 0543 10/10/16 0747  BP: (!) 164/67 (!) 148/63  Pulse: 70 69  Resp: 18 20  Temp: 36.8 C 36.4 C  SpO2: 96% 98%    Last Pain:  Vitals:   10/10/16 0937  TempSrc:   PainSc: Asleep                 [name]

## 2016-10-19 ENCOUNTER — Emergency Department (HOSPITAL_COMMUNITY)
Admission: EM | Admit: 2016-10-19 | Discharge: 2016-10-20 | Disposition: A | Discharge status: Home / Self Care | Payer: Medicare Other | Attending: Emergency Medicine | Admitting: Emergency Medicine

## 2016-10-19 ENCOUNTER — Encounter (HOSPITAL_COMMUNITY): Payer: Self-pay | Admitting: *Deleted

## 2016-10-19 DIAGNOSIS — I4821 Permanent atrial fibrillation: Secondary | ICD-10-CM

## 2016-10-19 DIAGNOSIS — J449 Chronic obstructive pulmonary disease, unspecified: Secondary | ICD-10-CM | POA: Diagnosis not present

## 2016-10-19 DIAGNOSIS — Z951 Presence of aortocoronary bypass graft: Secondary | ICD-10-CM | POA: Diagnosis not present

## 2016-10-19 DIAGNOSIS — E1122 Type 2 diabetes mellitus with diabetic chronic kidney disease: Secondary | ICD-10-CM | POA: Insufficient documentation

## 2016-10-19 DIAGNOSIS — I2510 Atherosclerotic heart disease of native coronary artery without angina pectoris: Secondary | ICD-10-CM | POA: Insufficient documentation

## 2016-10-19 DIAGNOSIS — R0602 Shortness of breath: Secondary | ICD-10-CM

## 2016-10-19 DIAGNOSIS — Z87891 Personal history of nicotine dependence: Secondary | ICD-10-CM | POA: Diagnosis not present

## 2016-10-19 DIAGNOSIS — I130 Hypertensive heart and chronic kidney disease with heart failure and stage 1 through stage 4 chronic kidney disease, or unspecified chronic kidney disease: Secondary | ICD-10-CM | POA: Diagnosis not present

## 2016-10-19 DIAGNOSIS — I482 Chronic atrial fibrillation: Secondary | ICD-10-CM | POA: Insufficient documentation

## 2016-10-19 DIAGNOSIS — N183 Chronic kidney disease, stage 3 (moderate): Secondary | ICD-10-CM | POA: Insufficient documentation

## 2016-10-19 DIAGNOSIS — Z79899 Other long term (current) drug therapy: Secondary | ICD-10-CM | POA: Diagnosis not present

## 2016-10-19 DIAGNOSIS — I5032 Chronic diastolic (congestive) heart failure: Secondary | ICD-10-CM | POA: Insufficient documentation

## 2016-10-19 DIAGNOSIS — Z7901 Long term (current) use of anticoagulants: Secondary | ICD-10-CM | POA: Diagnosis not present

## 2016-10-19 NOTE — ED Triage Notes (Signed)
Pt c/o some sob all day with some chest pain; pt given ativan and breathing treatment at facility pta of ems; pt is alert and oriented

## 2016-10-20 ENCOUNTER — Emergency Department (HOSPITAL_COMMUNITY): Payer: Medicare Other

## 2016-10-20 DIAGNOSIS — I482 Chronic atrial fibrillation: Secondary | ICD-10-CM | POA: Diagnosis not present

## 2016-10-20 LAB — BLOOD GAS, ARTERIAL
Acid-Base Excess: 2.6 mmol/L — ABNORMAL HIGH (ref 0.0–2.0)
BICARBONATE: 26.7 mmol/L (ref 20.0–28.0)
DRAWN BY: 22223
FIO2: 28
O2 CONTENT: 2 L/min
O2 Saturation: 98.8 %
PH ART: 7.424 (ref 7.350–7.450)
pCO2 arterial: 41.4 mmHg (ref 32.0–48.0)
pO2, Arterial: 126 mmHg — ABNORMAL HIGH (ref 83.0–108.0)

## 2016-10-20 LAB — CBC WITH DIFFERENTIAL/PLATELET
BASOS PCT: 1 %
Basophils Absolute: 0.1 10*3/uL (ref 0.0–0.1)
Eosinophils Absolute: 0.5 10*3/uL (ref 0.0–0.7)
Eosinophils Relative: 5 %
HEMATOCRIT: 27.8 % — AB (ref 39.0–52.0)
HEMOGLOBIN: 9.1 g/dL — AB (ref 13.0–17.0)
LYMPHS ABS: 1.1 10*3/uL (ref 0.7–4.0)
LYMPHS PCT: 11 %
MCH: 27.2 pg (ref 26.0–34.0)
MCHC: 32.7 g/dL (ref 30.0–36.0)
MCV: 83.2 fL (ref 78.0–100.0)
MONO ABS: 0.8 10*3/uL (ref 0.1–1.0)
MONOS PCT: 8 %
NEUTROS ABS: 7.9 10*3/uL — AB (ref 1.7–7.7)
NEUTROS PCT: 77 %
Platelets: 302 10*3/uL (ref 150–400)
RBC: 3.34 MIL/uL — ABNORMAL LOW (ref 4.22–5.81)
RDW: 13.5 % (ref 11.5–15.5)
WBC: 10.3 10*3/uL (ref 4.0–10.5)

## 2016-10-20 LAB — BASIC METABOLIC PANEL
ANION GAP: 9 (ref 5–15)
BUN: 18 mg/dL (ref 6–20)
CALCIUM: 10.5 mg/dL — AB (ref 8.9–10.3)
CHLORIDE: 97 mmol/L — AB (ref 101–111)
CO2: 28 mmol/L (ref 22–32)
Creatinine, Ser: 1.34 mg/dL — ABNORMAL HIGH (ref 0.61–1.24)
GFR calc Af Amer: 55 mL/min — ABNORMAL LOW (ref >60)
GFR calc non Af Amer: 47 mL/min — ABNORMAL LOW (ref >60)
GLUCOSE: 210 mg/dL — AB (ref 65–99)
Potassium: 4 mmol/L (ref 3.5–5.1)
Sodium: 134 mmol/L — ABNORMAL LOW (ref 135–145)

## 2016-10-20 LAB — TROPONIN I: Troponin I: <0.03 ng/mL (ref <0.03)

## 2016-10-20 LAB — BRAIN NATRIURETIC PEPTIDE: B Natriuretic Peptide: 211 pg/mL — ABNORMAL HIGH (ref 0.0–100.0)

## 2016-10-20 NOTE — Discharge Instructions (Signed)
BE SURE TO RESTART ELIQUIS TWICE A DAY AS INSTRUCTED AT [place]

## 2016-10-20 NOTE — ED Provider Notes (Signed)
[facility] EMERGENCY DEPARTMENT Provider Note   CSN: [id number] Arrival date & time: 10/19/16  2345     History   Chief Complaint Chief Complaint  Patient presents with  . Shortness of Breath    HPI [name] is a 83 y.o. male.  The history is provided by the patient and the nursing home.  Shortness of Breath  This is a new problem. The problem occurs continuously.Episode onset: unknown. The problem has been gradually improving. Associated symptoms include cough. Pertinent negatives include no fever. Associated medical issues include COPD.  Patient with h/o CAD, COPD (oxygen 2LNC continuous) recent left BKA, h/o persistent afib presents with shortness of breath Pt tells me this has been going on for "2 or 3 weeks" He has cough He reports some abdominal discomfort No other acute complaints  I spoke to nursing facility [facility]) They reports when she arrived on shift at 1900, pt appeared dyspneic.  He was given neb treatments and ativan without improvement He is on oxygen chronically He has recent h/o left BKA  Pt is a DNR Past Medical History:  Diagnosis Date  . BPH (benign prostatic hyperplasia)   . CAD (coronary artery disease)    Multivessel status post CABG 2011 - LIMA to LAD, SVG to diagonal, SVG to OM, SVG to PDA  . Cellulitis    12/15  . Chronic back pain   . Chronic diastolic CHF (congestive heart failure) ([place])   . CKD (chronic kidney disease), stage III ([place])   . COPD (chronic obstructive pulmonary disease) ([place])   . Essential hypertension   . Gastric mass    EGD 9/15  . GERD (gastroesophageal reflux disease)   . History of DVT (deep vein thrombosis)    Postphlebitic syndrome  . History of kidney stones   . HOH (hard of hearing)   . Hx of CABG   . Hyperlipidemia   . Persistent atrial fibrillation ([place])    a. s/p DCCV 03/2016.  [name] Sleep apnea    Stop Bang score of 5  . Type 2 diabetes mellitus [facility])     Patient Active Problem List   Diagnosis  Date Noted  . Osteomyelitis of left foot ([place]) 10/06/2016  . Acute osteomyelitis of left foot ([place]) 10/06/2016  . CHF exacerbation ([place]) 09/24/2016  . Cellulitis and abscess of foot   . HCAP (healthcare-associated pneumonia) 09/09/2016  . COPD (chronic obstructive pulmonary disease) ([place]) 09/09/2016  . CAD (coronary artery disease) 09/09/2016  . Hyperlipidemia 09/09/2016  . Type 2 diabetes mellitus ([place]) 07/24/2016  . Pressure injury of skin 06/11/2016  . Atrial fibrillation with normal ventricular rate ([place]) 06/10/2016  . Esophageal stricture 06/09/2016  . CAD in native artery 06/08/2016  . Acute renal failure superimposed on stage 3 chronic kidney disease ([place]) 06/08/2016  . Chronic diastolic CHF (congestive heart failure) ([place]) 03/19/2016  . CAP (community acquired pneumonia) 03/19/2016  . Abnormal weight loss   . Thrush, oral 12/18/2013  . Chest pain at rest 12/18/2013  . Leukocytosis 12/18/2013  . Diabetes ([place]) 12/18/2013  . Chronic back pain 12/18/2013  . Dysphagia 12/18/2013  . Odynophagia 12/18/2013  . Gastric mass 09/07/2013  . Personal history of colonic polyps 08/05/2013  . Nausea and vomiting 08/01/2013  . UTI (lower urinary tract infection) 12/09/2012  . Lower extremity edema 06/02/2012  . Essential hypertension 06/02/2012  . History of DVT (deep vein thrombosis) 06/02/2012  . Obesity (BMI 30-39.9): BMI 31.3 06/02/2012    Past Surgical History:  Procedure Laterality Date  . AMPUTATION Left 10/07/2016   Procedure: AMPUTATION BELOW KNEE;  Surgeon: [name], MD;  Location: [place];  Service: Orthopedics;  Laterality: Left;  . CARDIOVERSION N/A 03/20/2016   Procedure: CARDIOVERSION;  Surgeon: Arnoldo Lenis, MD;  [facility];  Service: Endoscopy;  Laterality: N/A;  . CATARACT EXTRACTION W/PHACO Left 02/07/2016   Procedure: CATARACT EXTRACTION PHACO AND INTRAOCULAR LENS PLACEMENT (IOC);  Surgeon: [name], MD;  Location: [facility];  Service:  Ophthalmology;  Laterality: Left;  CDE:  23.13  . COLONOSCOPY  2004   Dr. Laural Golden: three small polyps at cecum, path unknown, external hemorrhoids  . COLONOSCOPY N/A 08/16/2013   Dr. [name]: incomplete prep. multiple tubular adenomas, multiple biopsies. Needs surveillance in Aug 2016 due to poor prep  . CORONARY ARTERY BYPASS GRAFT     x5  . CORONARY ARTERY BYPASS GRAFT  2011  . CYSTOSCOPY N/A 12/12/2012   Procedure: CYSTOSCOPY FLEXIBLE;  Surgeon: Marissa Nestle, MD;  Location: [facility];  Service: Urology;  Laterality: N/A;  . ESOPHAGEAL DILATION N/A 09/14/2016   Procedure: ESOPHAGEAL DILATION;  Surgeon: Daneil Dolin, MD;  [facility];  Service: Endoscopy;  Laterality: N/A;  . ESOPHAGOGASTRODUODENOSCOPY N/A 08/16/2013   Dr. [name]: normal esophagus s/p Maloney dilation, gastric erosions, submucosal gastric mass vs extrinsic mass  . ESOPHAGOGASTRODUODENOSCOPY (EGD) WITH PROPOFOL N/A 09/14/2016   Procedure: ESOPHAGOGASTRODUODENOSCOPY (EGD) WITH PROPOFOL;  Surgeon: Daneil Dolin, MD;  [facility];  Service: Endoscopy;  Laterality: N/A;  . EUS N/A 09/07/2013   Dr. 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 INCISION AND DRAINAGE ABSCESS N/A 12/20/2013   Procedure: INCISION AND DRAINAGE ABSCESS NECK;  Surgeon: Jamesetta So, MD;  Location: [facility];  Service: General;  Laterality: N/A;  . INCISION AND DRAINAGE ABSCESS Left 09/04/2016   Procedure: INCISION AND DRAINAGE ABSCESS;  Surgeon: Aviva Signs, MD;  Location: [facility];  Service: General;  Laterality: Left;  [name] MALONEY DILATION N/A 08/16/2013   Procedure: Venia Minks DILATION;  Surgeon: Daneil Dolin, MD;  [facility];  Service: Endoscopy;  Laterality: N/A;       Home Medications    Prior to Admission medications   Medication Sig Start Date End Date Taking? Authorizing Provider  acetaminophen (TYLENOL) 325 MG tablet Take 650 mg by mouth every 6 (six) hours as needed.    [provider]  Amino Acids-Protein Hydrolys (FEEDING SUPPLEMENT, PRO-STAT SUGAR FREE 64,) LIQD Take 30 mLs by mouth 2 (two) times daily between meals.    [provider]  amLODipine (NORVASC) 10 MG tablet Take 1 tablet (10 mg total) by mouth daily. 09/27/16   [name], MD  antiseptic oral rinse (BIOTENE) LIQD 15 mLs by Mouth Rinse route 4 (four) times daily.    [provider]  apixaban (ELIQUIS) 5 MG TABS tablet Take 5 mg by mouth 2 (two) times daily.    [provider]  cloNIDine (CATAPRES) 0.1 MG tablet Take 0.1 mg by mouth 2 (two) times daily.    [provider]  docusate sodium (COLACE) 100 MG capsule Take 100 mg by mouth 2 (two) times daily.    [provider]  Ferrous Sulfate (IRON) 325 (65 Fe) MG TABS Give 1 tablet by mouth once a day    [provider]  FLUoxetine (PROZAC) 20 MG/5ML solution Take 5 mLs (20 mg total) by mouth daily. 09/09/16   Dondiego,  Richard, MD  furosemide (LASIX) 40 MG tablet Take 1 tablet (40 mg total) by mouth daily as needed. 10/10/16   Debbe Odea, MD  GLUCERNA (GLUCERNA) LIQD Take 237 mLs by mouth 2 (two) times daily between meals.    [provider]  insulin aspart (NOVOLOG) 100 UNIT/ML injection Inject 2 Units into the skin 3 (three) times daily with meals. Do not administer if he will not eat > 50 % of meal 10/10/16   Debbe Odea, MD  insulin glargine (LANTUS) 100 UNIT/ML injection Inject 0.1 mLs (10 Units total) into the skin at bedtime. 10/10/16   Debbe Odea, MD  ipratropium-albuterol (DUONEB) 0.5-2.5 (3) MG/3ML SOLN Take 3 mLs by nebulization every 6 (six) hours as needed.    [provider]  irbesartan (AVAPRO) 75 MG tablet Take 1 tablet (75 mg total) by mouth daily. 10/10/16   Debbe Odea, MD  LORazepam (ATIVAN) 0.5 MG tablet Take 1 tablet (0.5 mg total) by mouth 4 (four) times daily. Patient taking differently: Take 0.5 mg by mouth 4 (four) times daily as needed for anxiety.  09/26/16    [name], MD  metoprolol succinate (TOPROL-XL) 25 MG 24 hr tablet Take 3 tablets (75 mg total) by mouth daily. Take with or immediately following a meal. 06/16/16   [name], MD  nitroGLYCERIN (NITROSTAT) 0.4 MG SL tablet Place 1 tablet (0.4 mg total) under the tongue every 5 (five) minutes as needed for chest pain. 12/22/13   [name], MD  oxyCODONE (ROXICODONE) 15 MG immediate release tablet Take 1 tablet (15 mg total) by mouth every 8 (eight) hours as needed for pain. 10/10/16   Debbe Odea, MD  pantoprazole (PROTONIX) 40 MG tablet Take 1 tablet (40 mg total) by mouth daily. 09/16/16   [name], MD  potassium chloride SA (K-DUR,KLOR-CON) 20 MEQ tablet Take 1 tablet (20 mEq total) by mouth daily as needed. Take when Lasix is needed. 10/10/16   Debbe Odea, MD  Probiotic Product (RISA-BID PROBIOTIC) TABS Take 1 tablet by mouth twice a day    [provider]  rosuvastatin (CRESTOR) 10 MG tablet Take 10 mg by mouth at bedtime.     [provider]  tamsulosin (FLOMAX) 0.4 MG CAPS capsule Take 1 capsule (0.4 mg total) by mouth daily. Patient taking differently: Take 0.4 mg by mouth at bedtime.  09/27/16   [name], MD    Family History Family History  Problem Relation Age of Onset  . Colon cancer Son 29       deceased    Social History Social History  Substance Use Topics  . Smoking status: Former Smoker    Packs/day: 1.00    Years: 11.00    Quit date: 01/05/1956  . Smokeless tobacco: Never Used  . Alcohol use No     Allergies   Patient has no known allergies.   Review of Systems Review of Systems  Constitutional: Negative for fever.  Respiratory: Positive for cough and shortness of breath.   All other systems reviewed and are negative.    Physical Exam Updated Vital Signs BP 138/69   Pulse 85   Temp 98.6 F (37 C) (Oral)   Resp 12   Ht 1.727 m (5\' 8" )   Wt 91.6 kg (202 lb)   SpO2 99%   BMI 30.71 kg/m    Physical Exam CONSTITUTIONAL: elderly, no acute distress HEAD: Normocephalic/atraumatic EYES: EOMI/PERRL ENMT: Mucous membranes moist NECK: supple no meningeal signs CV: irregular, no loud  murmurs LUNGS: decreased BS noted bilaterally, no apparent distress ABDOMEN: soft, nontender, no rebound or guarding, bowel sounds noted throughout abdomen NEURO: Pt is awake/alert/appropriate, moves all extremitiesx4.  No facial droop.   EXTREMITIES: left LE - knee immobilizer s/p BKA SKIN: warm, color normal PSYCH: no abnormalities of mood noted, alert and oriented to situation   ED Treatments / Results  Labs (all labs ordered are listed, but only abnormal results are displayed) Labs Reviewed  BASIC METABOLIC PANEL - Abnormal; Notable for the following:       Result Value   Sodium 134 (*)    Chloride 97 (*)    Glucose, Bld 210 (*)    Creatinine, Ser 1.34 (*)    Calcium 10.5 (*)    GFR calc non Af Amer 47 (*)    GFR calc Af Amer 55 (*)    All other components within normal limits  CBC WITH DIFFERENTIAL/PLATELET - Abnormal; Notable for the following:    RBC 3.34 (*)    Hemoglobin 9.1 (*)    HCT 27.8 (*)    Neutro Abs 7.9 (*)    All other components within normal limits  BRAIN NATRIURETIC PEPTIDE - Abnormal; Notable for the following:    B Natriuretic Peptide 211.0 (*)    All other components within normal limits  BLOOD GAS, ARTERIAL - Abnormal; Notable for the following:    pO2, Arterial 126 (*)    Acid-Base Excess 2.6 (*)    All other components within normal limits  TROPONIN I    EKG  EKG Interpretation  Date/Time:  Monday October 19 2016 23:51:50 EDT Ventricular Rate:  95 PR Interval:    QRS Duration: 89 QT Interval:  356 QTC Calculation: 448 R Axis:   103 Text Interpretation:  Atrial fibrillation Ventricular premature complex Right axis deviation Borderline T abnormalities, inferior leads No significant change since last tracing Confirmed by Ripley Fraise [phone]) on  10/19/2016 11:58:37 PM       Radiology Dg Chest 2 View  Result Date: 10/20/2016 CLINICAL DATA:  Shortness of breath and chest pain all day. History of hypertension, diabetes, former smoker, cardiac history. EXAM: CHEST  2 VIEW COMPARISON:  10/09/2016 FINDINGS: Postoperative changes in the mediastinum. Shallow inspiration with linear atelectasis in the left lung base, improved since prior study. Cardiac enlargement without vascular congestion. No edema or consolidation. No blunting of costophrenic angles. No pneumothorax. Calcification of the aorta. IMPRESSION: Shallow inspiration with linear atelectasis in the left lung base, improved since previous study. Cardiac enlargement. No evidence of active pulmonary disease. Electronically Signed   By: Lucienne Capers M.D.   On: 10/20/2016 00:45    Procedures Procedures (including critical care time)  Medications Ordered in ED Medications - No data to display   Initial Impression / Assessment and Plan / ED Course  I have reviewed the triage vital signs and the nursing notes.  Pertinent labs & imaging results that were available during my care of the patient were reviewed by me and considered in my medical decision making (see chart for details).     This patients CHA2DS2-VASc Score and unadjusted Ischemic Stroke Rate (% per year) is equal to 4.8 % stroke rate/year from a score of 4  Above score calculated as 1 point each if present [CHF, HTN, DM, Vascular=MI/PAD/Aortic Plaque, Age if 65-74, or Male] Above score calculated as 2 points each if present [Age > 75, or Stroke/TIA/TE]    12:59 AM Pt stable Vitals appropriate He is resting  comfortably Workup pending at this time 2:14 AM Workup reassuring Pt resting comfortably on home oxygen No signs of CHF No significant wheeze to suggest COPD afib is rate controlled He is supposed to be on eliquis per recent d/c summary Will d/c back to nursing facility and encourage to restart  eliquis BID    Final Clinical Impressions(s) / ED Diagnoses   Final diagnoses:  Shortness of breath  Permanent atrial fibrillation [facility])    New Prescriptions New Prescriptions   No medications on file     Ripley Fraise, MD 10/20/16 0215

## 2016-10-22 ENCOUNTER — Encounter (INDEPENDENT_AMBULATORY_CARE_PROVIDER_SITE_OTHER): Payer: Self-pay | Admitting: Orthopedic Surgery

## 2016-10-22 ENCOUNTER — Ambulatory Visit (INDEPENDENT_AMBULATORY_CARE_PROVIDER_SITE_OTHER): Payer: Medicare Other | Admitting: Orthopedic Surgery

## 2016-10-22 DIAGNOSIS — Z89512 Acquired absence of left leg below knee: Secondary | ICD-10-CM

## 2016-10-22 NOTE — Progress Notes (Signed)
Office Visit Note   Patient: [name]           Date of Birth: 1931-08-24           MRN: [id number] Visit Date: 10/22/2016              Requested by: [name], MD [address] [place], [place] [address] PCP: [name], MD  Chief Complaint  Patient presents with  . Left Leg - Follow-up    10/07/16 L BKA      HPI: Patient is an 83 year old gentleman who is 2 weeks status post left transtibial amputation with Dr. Lorin Mercy. Patient is seen for skilled nursing for evaluation of the residual limb.  Assessment & Plan: Visit Diagnoses:  1. Acquired absence of left leg below knee (HCC)     Plan: start Dial soap cleansing and dry dressing changes daily work on range of motion of the left knee with extension and strengthening follow-up in 1 week to harvest the sutures.  Follow-Up Instructions: Return in about 1 week (around 10/29/2016).   Ortho Exam  Patient is alert, oriented, no adenopathy, well-dressed, normal affect, normal respiratory effort. Examination the incision is well approximated there is good healing and no signs of infection no swelling no drainage  Imaging: No results found. No images are attached to the encounter.  Labs: Lab Results  Component Value Date   HGBA1C 8.5 (H) 09/24/2016   HGBA1C 9.0 (H) 09/17/2016   HGBA1C 9.8 (H) 08/20/2016   ESRSEDRATE 48 (H) 10/06/2016   ESRSEDRATE 85 (H) 09/01/2016   CRP <0.8 10/06/2016   CRP 13.3 (H) 09/01/2016   REPTSTATUS 10/11/2016 FINAL 10/06/2016   GRAMSTAIN  10/01/2016    RARE WBC PRESENT, PREDOMINANTLY PMN MODERATE GRAM POSITIVE COCCI IN PAIRS FEW GRAM NEGATIVE COCCOBACILLI    CULT NO GROWTH 5 DAYS 10/06/2016   LABORGA KLEBSIELLA OXYTOCA 10/01/2016   LABORGA ENTEROCOCCUS FAECALIS 10/01/2016    Orders:  No orders of the defined types were placed in this encounter.  No orders of the defined types were placed in this encounter.    Procedures: No procedures performed  Clinical  Data: No additional findings.  ROS:  All other systems negative, except as noted in the HPI. Review of Systems  Objective: Vital Signs: There were no vitals taken for this visit.  Specialty Comments:  No specialty comments available.  PMFS History: Patient Active Problem List   Diagnosis Date Noted  . Osteomyelitis of left foot ([name]) 10/06/2016  . Acute osteomyelitis of left foot ([name]) 10/06/2016  . CHF exacerbation ([name]) 09/24/2016  . Cellulitis and abscess of foot   . HCAP (healthcare-associated pneumonia) 09/09/2016  . COPD (chronic obstructive pulmonary disease) ([name]) 09/09/2016  . CAD (coronary artery disease) 09/09/2016  . Hyperlipidemia 09/09/2016  . Type 2 diabetes mellitus ([name]) 07/24/2016  . Pressure injury of skin 06/11/2016  . Atrial fibrillation with normal ventricular rate ([name]) 06/10/2016  . Esophageal stricture 06/09/2016  . CAD in native artery 06/08/2016  . Acute renal failure superimposed on stage 3 chronic kidney disease ([name]) 06/08/2016  . Chronic diastolic CHF (congestive heart failure) ([name]) 03/19/2016  . CAP (community acquired pneumonia) 03/19/2016  . Abnormal weight loss   . Thrush, oral 12/18/2013  . Chest pain at rest 12/18/2013  . Leukocytosis 12/18/2013  . Diabetes ([place]) 12/18/2013  . Chronic back pain 12/18/2013  . Dysphagia 12/18/2013  . Odynophagia 12/18/2013  . Gastric mass 09/07/2013  . Personal history of colonic polyps 08/05/2013  .  Nausea and vomiting 08/01/2013  . UTI (lower urinary tract infection) 12/09/2012  . Lower extremity edema 06/02/2012  . Essential hypertension 06/02/2012  . History of DVT (deep vein thrombosis) 06/02/2012  . Obesity (BMI 30-39.9): BMI 31.3 06/02/2012   Past Medical History:  Diagnosis Date  . BPH (benign prostatic hyperplasia)   . CAD (coronary artery disease)    Multivessel status post CABG 2011 - LIMA to LAD, SVG to diagonal, SVG to OM, SVG to PDA  . Cellulitis    12/15  . Chronic back pain    . Chronic diastolic CHF (congestive heart failure) ([name])   . CKD (chronic kidney disease), stage III ([place])   . COPD (chronic obstructive pulmonary disease) ([name])   . Essential hypertension   . Gastric mass    EGD 9/15  . GERD (gastroesophageal reflux disease)   . History of DVT (deep vein thrombosis)    Postphlebitic syndrome  . History of kidney stones   . HOH (hard of hearing)   . Hx of CABG   . Hyperlipidemia   . Persistent atrial fibrillation ([name])    a. s/p DCCV 03/2016.  [name] Sleep apnea    Stop Bang score of 5  . Type 2 diabetes mellitus (HCC)     Family History  Problem Relation Age of Onset  . Colon cancer Son 34       deceased    Past Surgical History:  Procedure Laterality Date  . AMPUTATION Left 10/07/2016   Procedure: AMPUTATION BELOW KNEE;  Surgeon: [name], MD;  Location: [place];  Service: Orthopedics;  Laterality: Left;  . CARDIOVERSION N/A 03/20/2016   Procedure: CARDIOVERSION;  Surgeon: Arnoldo Lenis, MD;  [facility];  Service: Endoscopy;  Laterality: N/A;  . CATARACT EXTRACTION W/PHACO Left 02/07/2016   Procedure: CATARACT EXTRACTION PHACO AND INTRAOCULAR LENS PLACEMENT (IOC);  Surgeon: [name], MD;  [facility];  Service: Ophthalmology;  Laterality: Left;  CDE:  23.13  . COLONOSCOPY  2004   Dr. Laural Golden: three small polyps at cecum, path unknown, external hemorrhoids  . COLONOSCOPY N/A 08/16/2013   Dr. [name]: incomplete prep. multiple tubular adenomas, multiple biopsies. Needs surveillance in Aug 2016 due to poor prep  . CORONARY ARTERY BYPASS GRAFT     x5  . CORONARY ARTERY BYPASS GRAFT  2011  . CYSTOSCOPY N/A 12/12/2012   Procedure: CYSTOSCOPY FLEXIBLE;  Surgeon: Marissa Nestle, MD;  [facility];  Service: Urology;  Laterality: N/A;  . ESOPHAGEAL DILATION N/A 09/14/2016   Procedure: ESOPHAGEAL DILATION;  Surgeon: Daneil Dolin, MD;  [facility];  Service: Endoscopy;  Laterality: N/A;  .  ESOPHAGOGASTRODUODENOSCOPY N/A 08/16/2013   Dr. [name]: normal esophagus s/p Maloney dilation, gastric erosions, submucosal gastric mass vs extrinsic mass  . ESOPHAGOGASTRODUODENOSCOPY (EGD) WITH PROPOFOL N/A 09/14/2016   Procedure: ESOPHAGOGASTRODUODENOSCOPY (EGD) WITH PROPOFOL;  Surgeon: Daneil Dolin, MD;  [facility];  Service: Endoscopy;  Laterality: N/A;  . EUS N/A 09/07/2013   Dr. 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 INCISION AND DRAINAGE ABSCESS N/A 12/20/2013   Procedure: INCISION AND DRAINAGE ABSCESS NECK;  Surgeon: Jamesetta So, MD;  [facility];  Service: General;  Laterality: N/A;  . INCISION AND DRAINAGE ABSCESS Left 09/04/2016   Procedure: INCISION AND DRAINAGE ABSCESS;  Surgeon: Aviva Signs, MD;  [facility];  Service: General;  Laterality: Left;  [name] MALONEY DILATION N/A 08/16/2013   Procedure:  MALONEY DILATION;  Surgeon: Daneil Dolin, MD;  [facility];  Service: Endoscopy;  Laterality: N/A;   Social History   Occupational History  . Retired    Social History Main Topics  . Smoking status: Former Smoker    Packs/day: 1.00    Years: 11.00    Quit date: 01/05/1956  . Smokeless tobacco: Never Used  . Alcohol use No  . Drug use: No  . Sexual activity: No

## 2016-11-05 DIAGNOSIS — N390 Urinary tract infection, site not specified: Secondary | ICD-10-CM

## 2016-11-05 HISTORY — DX: Urinary tract infection, site not specified: N39.0

## 2016-11-12 ENCOUNTER — Encounter: Payer: Self-pay | Admitting: Urology

## 2016-11-12 ENCOUNTER — Ambulatory Visit (INDEPENDENT_AMBULATORY_CARE_PROVIDER_SITE_OTHER): Payer: Medicare Other | Admitting: Urology

## 2016-11-12 VITALS — BP 130/75 | HR 98 | Ht 71.0 in | Wt 177.0 lb

## 2016-11-12 DIAGNOSIS — R310 Gross hematuria: Secondary | ICD-10-CM | POA: Diagnosis not present

## 2016-11-12 DIAGNOSIS — N138 Other obstructive and reflux uropathy: Secondary | ICD-10-CM

## 2016-11-12 DIAGNOSIS — R339 Retention of urine, unspecified: Secondary | ICD-10-CM

## 2016-11-12 DIAGNOSIS — N401 Enlarged prostate with lower urinary tract symptoms: Secondary | ICD-10-CM | POA: Diagnosis not present

## 2016-11-12 LAB — URINALYSIS, COMPLETE
Bilirubin, UA: NEGATIVE
Glucose, UA: NEGATIVE
Ketones, UA: NEGATIVE
Nitrite, UA: NEGATIVE
PH UA: 5.5 (ref 5.0–7.5)
Specific Gravity, UA: 1.01 (ref 1.005–1.030)
Urobilinogen, Ur: 0.2 mg/dL (ref 0.2–1.0)

## 2016-11-12 LAB — MICROSCOPIC EXAMINATION
Epithelial Cells (non renal): NONE SEEN /hpf (ref 0–10)
RBC, UA: >30 /hpf — ABNORMAL HIGH (ref 0–?)

## 2016-11-12 NOTE — Progress Notes (Signed)
11/12/2016 4:05 PM   [name] 1931-04-22 [id number]  Referring provider: [name], MD [address] [place], [place] [address]  Chief Complaint  Patient presents with  . Urinary Retention    New Patient    HPI: 82 year old male with a history of BPH who presents today for further evaluation of urinary retention.  He currently resides at a rehab in [place].  He is been at this facility greater than a month following an admission for a left lower extremity wound infection after stepping on a bolt.  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   He has been having increasing difficulty with urinating over the past several weeks.  A Foley catheter was placed approximately 3 days ago.  He was previously on Flomax and recently started on Proscar in addition to this.  Catheter remains in place today.  Per report, the patient was having difficulty emptying his bladder.  He was having dribbles when he tried to void.  He is somewhat of a poor historian today and unable to provide additional information.  Prior to this admission and rehab, he reports that he urinated fine.  He is unsure whether or not the Flomax was started prior to his hospitalizations.  His creatinine was also noted to be elevated to 1.92 on 10/29/2016 prior to catheter placement which is up notably from his baseline of approximately 1.3  UA at that same time showed 10-20 red blood cells, 1+ bacteria, positive crystals, no red blood cells.  Urine culture was negative.  Per his notes, renal ultrasound was ordered but I do not see these results.  He has multiple medical comorbidities including CHF, COPD, diabetes, coronary artery disease, amongst others.   PMH: Past Medical History:  Diagnosis Date  . BPH (benign prostatic hyperplasia)   . CAD (coronary artery disease)    Multivessel status post CABG 2011 - LIMA to LAD, SVG to diagonal, SVG to OM, SVG to PDA  . Cellulitis    12/15  .  Chronic back pain   . Chronic diastolic CHF (congestive heart failure) ([place])   . CKD (chronic kidney disease), stage III ([place])   . COPD (chronic obstructive pulmonary disease) ([place])   . Essential hypertension   . Gastric mass    EGD 9/15  . GERD (gastroesophageal reflux disease)   . History of DVT (deep vein thrombosis)    Postphlebitic syndrome  . History of kidney stones   . HOH (hard of hearing)   . Hx of CABG   . Hyperlipidemia   . Persistent atrial fibrillation ([place])    a. s/p DCCV 03/2016.  [name] Sleep apnea    Stop Bang score of 5  . Type 2 diabetes mellitus [facility])     Surgical History: Past Surgical History:  Procedure Laterality Date  . COLONOSCOPY  2004   Dr. Laural Golden: three small polyps at cecum, path unknown, external hemorrhoids  . CORONARY ARTERY BYPASS GRAFT     x5  . CORONARY ARTERY BYPASS GRAFT  2011    Home Medications:  Allergies as of 11/12/2016   No Known Allergies     Medication List        Accurate as of 11/12/16  4:05 PM. Always use your most recent med list.          acetaminophen 325 MG tablet Commonly known as:  TYLENOL Take 650 mg by mouth every 6 (six) hours as needed.   amLODipine 10 MG tablet Commonly  known as:  NORVASC Take 1 tablet (10 mg total) by mouth daily.   antiseptic oral rinse Liqd 15 mLs by Mouth Rinse route 4 (four) times daily.   cloNIDine 0.1 MG tablet Commonly known as:  CATAPRES Take 0.1 mg by mouth 2 (two) times daily.   docusate sodium 100 MG capsule Commonly known as:  COLACE Take 100 mg by mouth 2 (two) times daily.   ELIQUIS 5 MG Tabs tablet Generic drug:  apixaban Take 5 mg by mouth 2 (two) times daily.   feeding supplement (PRO-STAT SUGAR FREE 64) Liqd Take 30 mLs by mouth 2 (two) times daily between meals.   FLUoxetine 20 MG/5ML solution Commonly known as:  PROZAC Take 5 mLs (20 mg total) by mouth daily.   furosemide 40 MG tablet Commonly known as:  LASIX Take 1 tablet (40 mg total) by mouth daily  as needed.   GLUCERNA Liqd Take 237 mLs by mouth 2 (two) times daily between meals.   insulin aspart 100 UNIT/ML injection Commonly known as:  novoLOG Inject 2 Units into the skin 3 (three) times daily with meals. Do not administer if he will not eat > 50 % of meal   insulin glargine 100 UNIT/ML injection Commonly known as:  LANTUS Inject 0.1 mLs (10 Units total) into the skin at bedtime.   ipratropium-albuterol 0.5-2.5 (3) MG/3ML Soln Commonly known as:  DUONEB Take 3 mLs by nebulization every 6 (six) hours as needed.   irbesartan 75 MG tablet Commonly known as:  AVAPRO Take 1 tablet (75 mg total) by mouth daily.   Iron 325 (65 Fe) MG Tabs Give 1 tablet by mouth once a day   LORazepam 0.5 MG tablet Commonly known as:  ATIVAN Take 1 tablet (0.5 mg total) by mouth 4 (four) times daily.   metoprolol succinate 25 MG 24 hr tablet Commonly known as:  TOPROL-XL Take 3 tablets (75 mg total) by mouth daily. Take with or immediately following a meal.   nitroGLYCERIN 0.4 MG SL tablet Commonly known as:  NITROSTAT Place 1 tablet (0.4 mg total) under the tongue every 5 (five) minutes as needed for chest pain.   oxyCODONE 15 MG immediate release tablet Commonly known as:  ROXICODONE Take 1 tablet (15 mg total) by mouth every 8 (eight) hours as needed for pain.   pantoprazole 40 MG tablet Commonly known as:  PROTONIX Take 1 tablet (40 mg total) by mouth daily.   potassium chloride SA 20 MEQ tablet Commonly known as:  K-DUR,KLOR-CON Take 1 tablet (20 mEq total) by mouth daily as needed. Take when Lasix is needed.   PROSCAR 5 MG tablet Generic drug:  finasteride Take 5 mg daily by mouth.   RISA-BID PROBIOTIC Tabs Take 1 tablet by mouth twice a day   rosuvastatin 10 MG tablet Commonly known as:  CRESTOR Take 10 mg by mouth at bedtime.   tamsulosin 0.4 MG Caps capsule Commonly known as:  FLOMAX Take 1 capsule (0.4 mg total) by mouth daily.       Allergies: No Known  Allergies  Family History: Family History  Problem Relation Age of Onset  . Colon cancer Son 60       deceased    Social History:  reports that he quit smoking about 60 years ago. He has a 11.00 pack-year smoking history. he has never used smokeless tobacco. He reports that he does not drink alcohol or use drugs.  ROS: UROLOGY Frequent Urination?: No Hard to postpone urination?:  No Burning/pain with urination?: No Get up at night to urinate?: No Leakage of urine?: No Urine stream starts and stops?: No Trouble starting stream?: No Do you have to strain to urinate?: No Blood in urine?: Yes Urinary tract infection?: No Sexually transmitted disease?: No Injury to kidneys or bladder?: No Painful intercourse?: No Weak stream?: No Erection problems?: No Penile pain?: No  Gastrointestinal Nausea?: No Vomiting?: No Indigestion/heartburn?: No Diarrhea?: No Constipation?: No  Constitutional Fever: No Night sweats?: No Weight loss?: No Fatigue?: No  Skin Skin rash/lesions?: No Itching?: No  Eyes Blurred vision?: No Double vision?: No  Ears/Nose/Throat Sore throat?: No Sinus problems?: No  Hematologic/Lymphatic Swollen glands?: No Easy bruising?: No  Cardiovascular Leg swelling?: No Chest pain?: No  Respiratory Cough?: No Shortness of breath?: No  Endocrine Excessive thirst?: No  Musculoskeletal Back pain?: No Joint pain?: No  Neurological Headaches?: No Dizziness?: No  Psychologic Depression?: No Anxiety?: No  Physical Exam: BP 130/75   Pulse 98   Ht 5\' 11"  (1.803 m)   Wt 177 lb (80.3 kg)   BMI 24.69 kg/m   Constitutional:  Alert and oriented, No acute distress.  Accompanied by friend today by friend today.  Wheelchair bound and nonweightbearing. HEENT: [place] AT, moist mucus membranes.  Trachea midline, no masses.  Difficulty hearing. Cardiovascular: No significant edema in right lower extremity.  Left extremity is surgically  absent. Respiratory: Normal respiratory effort, no increased work of breathing.  Wearing nasal cannula. GI: Abdomen is soft, nontender, nondistended, obese.  Foley draining blood-tinged urine with debris. Rectal exam: Unable to weight-bear today, will defer to next visit Skin: No rashes, bruises or suspicious lesions. Neurologic: Grossly intact, no focal deficits. Psychiatric: Normal mood and affect.  Laboratory Data: Lab Results  Component Value Date   WBC 10.3 10/20/2016   HGB 9.1 (L) 10/20/2016   HCT 27.8 (L) 10/20/2016   MCV 83.2 10/20/2016   PLT 302 10/20/2016    Lab Results  Component Value Date   CREATININE 1.34 (H) 10/20/2016   Recent creatinine is above  Lab Results  Component Value Date   HGBA1C 8.5 (H) 09/24/2016    Urinalysis Results for orders placed or performed in visit on 11/12/16  Microscopic Examination  Result Value Ref Range   WBC, UA 11-30 (A) 0 - 5 /hpf   RBC, UA >30 (H) 0 - 2 /hpf   Epithelial Cells (non renal) None seen 0 - 10 /hpf   Mucus, UA Present (A) Not Estab.   Bacteria, UA Moderate (A) None seen/Few  Urinalysis, Complete  Result Value Ref Range   Specific Gravity, UA 1.010 1.005 - 1.030   pH, UA 5.5 5.0 - 7.5   Color, UA Pink (A) Yellow   Appearance Ur Cloudy (A) Clear   Leukocytes, UA 2+ (A) Negative   Protein, UA 2+ (A) Negative/Trace   Glucose, UA Negative Negative   Ketones, UA Negative Negative   RBC, UA 3+ (A) Negative   Bilirubin, UA Negative Negative   Urobilinogen, Ur 0.2 0.2 - 1.0 mg/dL   Nitrite, UA Negative Negative   Microscopic Examination See below:      Pertinent Imaging: Results for orders placed during the hospital encounter of 10/06/16  US RENAL   Narrative CLINICAL DATA:  83 year old male with history of acute kidney injury.  EXAM: RENAL / URINARY TRACT ULTRASOUND COMPLETE  COMPARISON:  Abdominal ultrasound 08/24/2016.  FINDINGS: Right Kidney:  Length: 11.1. Echogenicity within normal limits. No  mass or hydronephrosis visualized.  Left Kidney:  Length: 12.4. Echogenicity within normal limits. No mass or hydronephrosis visualized.  Bladder:  Urinary bladder was empty and could not be adequately assessed.  IMPRESSION: 1. Normal sonographic appearance of the kidneys bilaterally.   Electronically Signed   By: Vinnie Langton M.D.   On: 10/09/2016 16:44     Assessment & Plan:    1. Gross hematuria Urine red today UA concerning for possible UTI We will send culture and treat as needed - Urinalysis, Complete - CULTURE, URINE COMPREHENSIVE  2. BPH with urinary obstruction Continue Flomax and finasteride  3. Urinary retention Likely multifactorial in the setting of possible UTI, underlying BPH, medical illness Given he is only had a catheter for 3 days, will allow the catheter to remain for at least 1 week Voiding trial next week Will ultimately need PSA/DRE Briefly discussed options should he fail voiding trial including chronic indwelling Foley, CIC, and SP tube Unfortunately given his multiple medical comorbidities, he is a poor surgical candidate for TURP All questions answered   Return for 1 week nurse visit for VT, MD visit in 1 months with PSA/ DRE/ IPSS/ PVR .  Hollice Espy, MD  [facility] [address] [place], [place] [address] [phone]

## 2016-11-13 ENCOUNTER — Encounter (INDEPENDENT_AMBULATORY_CARE_PROVIDER_SITE_OTHER): Payer: Self-pay | Admitting: Orthopaedic Surgery

## 2016-11-13 ENCOUNTER — Emergency Department (HOSPITAL_COMMUNITY): Payer: Medicare Other

## 2016-11-13 ENCOUNTER — Inpatient Hospital Stay (HOSPITAL_COMMUNITY)
Admission: EM | Admit: 2016-11-13 | Discharge: 2016-11-19 | DRG: 689 | Disposition: A | Discharge status: Skilled Nursing Facility | Payer: Medicare Other | Attending: Family Medicine | Admitting: Family Medicine

## 2016-11-13 ENCOUNTER — Encounter (HOSPITAL_COMMUNITY): Payer: Self-pay | Admitting: Emergency Medicine

## 2016-11-13 ENCOUNTER — Ambulatory Visit (INDEPENDENT_AMBULATORY_CARE_PROVIDER_SITE_OTHER): Payer: Medicare Other | Admitting: Orthopaedic Surgery

## 2016-11-13 VITALS — BP 87/44 | HR 55

## 2016-11-13 DIAGNOSIS — E871 Hypo-osmolality and hyponatremia: Secondary | ICD-10-CM | POA: Diagnosis present

## 2016-11-13 DIAGNOSIS — N179 Acute kidney failure, unspecified: Secondary | ICD-10-CM | POA: Diagnosis not present

## 2016-11-13 DIAGNOSIS — G9341 Metabolic encephalopathy: Secondary | ICD-10-CM | POA: Diagnosis present

## 2016-11-13 DIAGNOSIS — E1122 Type 2 diabetes mellitus with diabetic chronic kidney disease: Secondary | ICD-10-CM | POA: Diagnosis not present

## 2016-11-13 DIAGNOSIS — R310 Gross hematuria: Secondary | ICD-10-CM

## 2016-11-13 DIAGNOSIS — D631 Anemia in chronic kidney disease: Secondary | ICD-10-CM | POA: Diagnosis not present

## 2016-11-13 DIAGNOSIS — R402362 Coma scale, best motor response, obeys commands, at arrival to emergency department: Secondary | ICD-10-CM | POA: Diagnosis present

## 2016-11-13 DIAGNOSIS — Z7901 Long term (current) use of anticoagulants: Secondary | ICD-10-CM

## 2016-11-13 DIAGNOSIS — R319 Hematuria, unspecified: Secondary | ICD-10-CM | POA: Diagnosis not present

## 2016-11-13 DIAGNOSIS — T83511A Infection and inflammatory reaction due to indwelling urethral catheter, initial encounter: Secondary | ICD-10-CM | POA: Diagnosis not present

## 2016-11-13 DIAGNOSIS — H9190 Unspecified hearing loss, unspecified ear: Secondary | ICD-10-CM | POA: Diagnosis present

## 2016-11-13 DIAGNOSIS — E1121 Type 2 diabetes mellitus with diabetic nephropathy: Secondary | ICD-10-CM | POA: Diagnosis present

## 2016-11-13 DIAGNOSIS — I481 Persistent atrial fibrillation: Secondary | ICD-10-CM | POA: Diagnosis present

## 2016-11-13 DIAGNOSIS — R262 Difficulty in walking, not elsewhere classified: Secondary | ICD-10-CM

## 2016-11-13 DIAGNOSIS — Z800 Family history of malignant neoplasm of digestive organs: Secondary | ICD-10-CM

## 2016-11-13 DIAGNOSIS — R410 Disorientation, unspecified: Secondary | ICD-10-CM

## 2016-11-13 DIAGNOSIS — Z87891 Personal history of nicotine dependence: Secondary | ICD-10-CM

## 2016-11-13 DIAGNOSIS — A419 Sepsis, unspecified organism: Secondary | ICD-10-CM | POA: Diagnosis not present

## 2016-11-13 DIAGNOSIS — K5900 Constipation, unspecified: Secondary | ICD-10-CM | POA: Diagnosis present

## 2016-11-13 DIAGNOSIS — N183 Chronic kidney disease, stage 3 (moderate): Secondary | ICD-10-CM

## 2016-11-13 DIAGNOSIS — G4730 Sleep apnea, unspecified: Secondary | ICD-10-CM | POA: Diagnosis present

## 2016-11-13 DIAGNOSIS — Z89512 Acquired absence of left leg below knee: Secondary | ICD-10-CM

## 2016-11-13 DIAGNOSIS — N390 Urinary tract infection, site not specified: Secondary | ICD-10-CM | POA: Diagnosis not present

## 2016-11-13 DIAGNOSIS — E785 Hyperlipidemia, unspecified: Secondary | ICD-10-CM | POA: Diagnosis present

## 2016-11-13 DIAGNOSIS — Z794 Long term (current) use of insulin: Secondary | ICD-10-CM

## 2016-11-13 DIAGNOSIS — R0602 Shortness of breath: Secondary | ICD-10-CM

## 2016-11-13 DIAGNOSIS — R402252 Coma scale, best verbal response, oriented, at arrival to emergency department: Secondary | ICD-10-CM | POA: Diagnosis present

## 2016-11-13 DIAGNOSIS — G8929 Other chronic pain: Secondary | ICD-10-CM | POA: Diagnosis present

## 2016-11-13 DIAGNOSIS — Z87442 Personal history of urinary calculi: Secondary | ICD-10-CM

## 2016-11-13 DIAGNOSIS — Z79899 Other long term (current) drug therapy: Secondary | ICD-10-CM

## 2016-11-13 DIAGNOSIS — I482 Chronic atrial fibrillation: Secondary | ICD-10-CM | POA: Diagnosis present

## 2016-11-13 DIAGNOSIS — R4182 Altered mental status, unspecified: Secondary | ICD-10-CM | POA: Diagnosis not present

## 2016-11-13 DIAGNOSIS — J449 Chronic obstructive pulmonary disease, unspecified: Secondary | ICD-10-CM | POA: Diagnosis present

## 2016-11-13 DIAGNOSIS — D649 Anemia, unspecified: Secondary | ICD-10-CM | POA: Diagnosis present

## 2016-11-13 DIAGNOSIS — R402142 Coma scale, eyes open, spontaneous, at arrival to emergency department: Secondary | ICD-10-CM | POA: Diagnosis present

## 2016-11-13 DIAGNOSIS — K219 Gastro-esophageal reflux disease without esophagitis: Secondary | ICD-10-CM | POA: Diagnosis present

## 2016-11-13 DIAGNOSIS — M549 Dorsalgia, unspecified: Secondary | ICD-10-CM | POA: Diagnosis present

## 2016-11-13 DIAGNOSIS — Z951 Presence of aortocoronary bypass graft: Secondary | ICD-10-CM

## 2016-11-13 DIAGNOSIS — I2510 Atherosclerotic heart disease of native coronary artery without angina pectoris: Secondary | ICD-10-CM | POA: Diagnosis present

## 2016-11-13 DIAGNOSIS — R079 Chest pain, unspecified: Secondary | ICD-10-CM

## 2016-11-13 DIAGNOSIS — I130 Hypertensive heart and chronic kidney disease with heart failure and stage 1 through stage 4 chronic kidney disease, or unspecified chronic kidney disease: Secondary | ICD-10-CM | POA: Diagnosis present

## 2016-11-13 DIAGNOSIS — N400 Enlarged prostate without lower urinary tract symptoms: Secondary | ICD-10-CM | POA: Diagnosis present

## 2016-11-13 DIAGNOSIS — Z86718 Personal history of other venous thrombosis and embolism: Secondary | ICD-10-CM

## 2016-11-13 HISTORY — DX: Urinary tract infection, site not specified: N39.0

## 2016-11-13 LAB — URINALYSIS, MICROSCOPIC (REFLEX): SQUAMOUS EPITHELIAL / LPF: NONE SEEN

## 2016-11-13 LAB — CBC
HCT: 30.9 % — ABNORMAL LOW (ref 39.0–52.0)
Hemoglobin: 9.7 g/dL — ABNORMAL LOW (ref 13.0–17.0)
MCH: 25.8 pg — AB (ref 26.0–34.0)
MCHC: 31.4 g/dL (ref 30.0–36.0)
MCV: 82.2 fL (ref 78.0–100.0)
PLATELETS: 263 10*3/uL (ref 150–400)
RBC: 3.76 MIL/uL — AB (ref 4.22–5.81)
RDW: 13.5 % (ref 11.5–15.5)
WBC: 18.4 10*3/uL — AB (ref 4.0–10.5)

## 2016-11-13 LAB — COMPREHENSIVE METABOLIC PANEL
ALK PHOS: 104 U/L (ref 38–126)
ALT: 16 U/L — AB (ref 17–63)
AST: 17 U/L (ref 15–41)
Albumin: 3.2 g/dL — ABNORMAL LOW (ref 3.5–5.0)
Anion gap: 7 (ref 5–15)
BUN: 41 mg/dL — ABNORMAL HIGH (ref 6–20)
CALCIUM: 10.2 mg/dL (ref 8.9–10.3)
CO2: 27 mmol/L (ref 22–32)
CREATININE: 1.89 mg/dL — AB (ref 0.61–1.24)
Chloride: 97 mmol/L — ABNORMAL LOW (ref 101–111)
GFR, EST AFRICAN AMERICAN: 36 mL/min — AB (ref >60)
GFR, EST NON AFRICAN AMERICAN: 31 mL/min — AB (ref >60)
Glucose, Bld: 133 mg/dL — ABNORMAL HIGH (ref 65–99)
Potassium: 4.8 mmol/L (ref 3.5–5.1)
Sodium: 131 mmol/L — ABNORMAL LOW (ref 135–145)
Total Bilirubin: 0.8 mg/dL (ref 0.3–1.2)
Total Protein: 6.2 g/dL — ABNORMAL LOW (ref 6.5–8.1)

## 2016-11-13 LAB — I-STAT CG4 LACTIC ACID, ED: LACTIC ACID, VENOUS: 1.79 mmol/L (ref 0.5–1.9)

## 2016-11-13 LAB — URINALYSIS, ROUTINE W REFLEX MICROSCOPIC
BILIRUBIN URINE: NEGATIVE
GLUCOSE, UA: NEGATIVE mg/dL
KETONES UR: NEGATIVE mg/dL
NITRITE: NEGATIVE
PROTEIN: 100 mg/dL — AB
Specific Gravity, Urine: 1.005 (ref 1.005–1.030)
pH: 6 (ref 5.0–8.0)

## 2016-11-13 LAB — TROPONIN I: Troponin I: <0.03 ng/mL (ref <0.03)

## 2016-11-13 LAB — GLUCOSE, CAPILLARY: GLUCOSE-CAPILLARY: 135 mg/dL — AB (ref 65–99)

## 2016-11-13 MED ORDER — DEXTROSE 5 % IV SOLN
1.0000 g | INTRAVENOUS | Status: DC
  Administered 2016-11-13 – 2016-11-18 (×6): 1 g via INTRAVENOUS
  Filled 2016-11-13 (×7): qty 10

## 2016-11-13 MED ORDER — SODIUM CHLORIDE 0.9 % IV SOLN
INTRAVENOUS | Status: DC
  Administered 2016-11-13 – 2016-11-16 (×9): via INTRAVENOUS

## 2016-11-13 MED ORDER — SODIUM CHLORIDE 0.9 % IV BOLUS (SEPSIS)
1000.0000 mL | Freq: Once | INTRAVENOUS | Status: AC
  Administered 2016-11-13: 1000 mL via INTRAVENOUS

## 2016-11-13 MED ORDER — INSULIN ASPART 100 UNIT/ML ~~LOC~~ SOLN
0.0000 [IU] | Freq: Three times a day (TID) | SUBCUTANEOUS | Status: DC
  Administered 2016-11-14 – 2016-11-15 (×4): 2 [IU] via SUBCUTANEOUS
  Administered 2016-11-15: 1 [IU] via SUBCUTANEOUS
  Administered 2016-11-15: 2 [IU] via SUBCUTANEOUS
  Administered 2016-11-16 (×2): 5 [IU] via SUBCUTANEOUS
  Administered 2016-11-16: 2 [IU] via SUBCUTANEOUS
  Administered 2016-11-17: 3 [IU] via SUBCUTANEOUS
  Administered 2016-11-17: 5 [IU] via SUBCUTANEOUS
  Administered 2016-11-17 – 2016-11-18 (×2): 3 [IU] via SUBCUTANEOUS
  Administered 2016-11-18: 5 [IU] via SUBCUTANEOUS
  Administered 2016-11-18: 3 [IU] via SUBCUTANEOUS
  Administered 2016-11-19: 1 [IU] via SUBCUTANEOUS
  Administered 2016-11-19: 3 [IU] via SUBCUTANEOUS

## 2016-11-13 NOTE — ED Notes (Signed)
Patient states he doesn't stand at home. Did not stand patient to obtain standing vitals for orthostatic vitals

## 2016-11-13 NOTE — ED Notes (Signed)
Pt not able to provide urine sample at this time, Urinal given and instructed to obtain a sample when possible.

## 2016-11-13 NOTE — ED Provider Notes (Signed)
[place] EMERGENCY DEPARTMENT Provider Note   CSN: [id number] Arrival date & time: 11/13/16  1550     History   Chief Complaint Chief Complaint  Patient presents with  . Hypotension    HPI [name] is a 83 y.o. male.  83 year old male presents with acute onset of weakness while at his doctor's office today.  Patient was therefore a postop visit after he had a left BKA approximately a month ago.  He denies any recent fever, vomiting, diarrhea.  Mild cough or congestion.  All in the office patient had some atypical chest pain which his friend who is with him says he always has.  Patient is unsure how long that lasted for.  He was not diaphoretic or short of breath with this.  Symptoms resolved spontaneously.  Patient also has had issues with his Foley catheter has been pulling at it.  It is still in place and draining pink urine.  EMS was called and patient found to be hypotensive blood pressure of 80/40.  Patient given IV fluids and transported here      Past Medical History:  Diagnosis Date  . BPH (benign prostatic hyperplasia)   . CAD (coronary artery disease)    Multivessel status post CABG 2011 - LIMA to LAD, SVG to diagonal, SVG to OM, SVG to PDA  . Cellulitis    12/15  . Chronic back pain   . Chronic diastolic CHF (congestive heart failure) ([place])   . CKD (chronic kidney disease), stage III ([place])   . COPD (chronic obstructive pulmonary disease) ([place])   . Essential hypertension   . Gastric mass    EGD 9/15  . GERD (gastroesophageal reflux disease)   . History of DVT (deep vein thrombosis)    Postphlebitic syndrome  . History of kidney stones   . HOH (hard of hearing)   . Hx of CABG   . Hyperlipidemia   . Persistent atrial fibrillation ([place])    a. s/p DCCV 03/2016.  [name] Sleep apnea    Stop Bang score of 5  . Type 2 diabetes mellitus [facility]     Patient Active Problem List   Diagnosis Date Noted  . Osteomyelitis of left foot ([place])  10/06/2016  . Acute osteomyelitis of left foot ([place]) 10/06/2016  . CHF exacerbation ([place]) 09/24/2016  . Cellulitis and abscess of foot   . HCAP (healthcare-associated pneumonia) 09/09/2016  . COPD (chronic obstructive pulmonary disease) ([place]) 09/09/2016  . CAD (coronary artery disease) 09/09/2016  . Hyperlipidemia 09/09/2016  . Type 2 diabetes mellitus ([place]) 07/24/2016  . Pressure injury of skin 06/11/2016  . Atrial fibrillation with normal ventricular rate ([place]) 06/10/2016  . Esophageal stricture 06/09/2016  . CAD in native artery 06/08/2016  . Acute renal failure superimposed on stage 3 chronic kidney disease ([place]) 06/08/2016  . Chronic diastolic CHF (congestive heart failure) ([place]) 03/19/2016  . CAP (community acquired pneumonia) 03/19/2016  . Abnormal weight loss   . Thrush, oral 12/18/2013  . Chest pain at rest 12/18/2013  . Leukocytosis 12/18/2013  . Diabetes ([place]) 12/18/2013  . Chronic back pain 12/18/2013  . Dysphagia 12/18/2013  . Odynophagia 12/18/2013  . Gastric mass 09/07/2013  . Personal history of colonic polyps 08/05/2013  . Nausea and vomiting 08/01/2013  . UTI (lower urinary tract infection) 12/09/2012  . Lower extremity edema 06/02/2012  . Essential hypertension 06/02/2012  . History of DVT (deep vein thrombosis) 06/02/2012  . Obesity (BMI 30-39.9): BMI 31.3 06/02/2012  Past Surgical History:  Procedure Laterality Date  . COLONOSCOPY  2004   Dr. Laural Golden: three small polyps at cecum, path unknown, external hemorrhoids  . CORONARY ARTERY BYPASS GRAFT     x5  . CORONARY ARTERY BYPASS GRAFT  2011       Home Medications    Prior to Admission medications   Medication Sig Start Date End Date Taking? Authorizing Provider  acetaminophen (TYLENOL) 325 MG tablet Take 650 mg by mouth every 6 (six) hours as needed.    [provider]  Amino Acids-Protein Hydrolys (FEEDING SUPPLEMENT, PRO-STAT SUGAR FREE 64,) LIQD Take 30 mLs by mouth 2 (two) times  daily between meals.    [provider]  amLODipine (NORVASC) 10 MG tablet Take 1 tablet (10 mg total) by mouth daily. 09/27/16   [name], MD  antiseptic oral rinse (BIOTENE) LIQD 15 mLs by Mouth Rinse route 4 (four) times daily.    [provider]  apixaban (ELIQUIS) 5 MG TABS tablet Take 5 mg by mouth 2 (two) times daily.    [provider]  cloNIDine (CATAPRES) 0.1 MG tablet Take 0.1 mg by mouth 2 (two) times daily.    [provider]  docusate sodium (COLACE) 100 MG capsule Take 100 mg by mouth 2 (two) times daily.    [provider]  Ferrous Sulfate (IRON) 325 (65 Fe) MG TABS Give 1 tablet by mouth once a day    [provider]  finasteride (PROSCAR) 5 MG tablet Take 5 mg daily by mouth.    [provider]  FLUoxetine (PROZAC) 20 MG/5ML solution Take 5 mLs (20 mg total) by mouth daily. 09/09/16   [name], MD  furosemide (LASIX) 40 MG tablet Take 1 tablet (40 mg total) by mouth daily as needed. 10/10/16   Debbe Odea, MD  GLUCERNA (GLUCERNA) LIQD Take 237 mLs by mouth 2 (two) times daily between meals.    [provider]  insulin aspart (NOVOLOG) 100 UNIT/ML injection Inject 2 Units into the skin 3 (three) times daily with meals. Do not administer if he will not eat > 50 % of meal 10/10/16   Debbe Odea, MD  insulin glargine (LANTUS) 100 UNIT/ML injection Inject 0.1 mLs (10 Units total) into the skin at bedtime. 10/10/16   Debbe Odea, MD  ipratropium-albuterol (DUONEB) 0.5-2.5 (3) MG/3ML SOLN Take 3 mLs by nebulization every 6 (six) hours as needed.    [provider]  irbesartan (AVAPRO) 75 MG tablet Take 1 tablet (75 mg total) by mouth daily. 10/10/16   Debbe Odea, MD  LORazepam (ATIVAN) 0.5 MG tablet Take 1 tablet (0.5 mg total) by mouth 4 (four) times daily. Patient taking differently: Take 0.5 mg by mouth 4 (four) times daily as needed for anxiety.  09/26/16   [name], MD    metoprolol succinate (TOPROL-XL) 25 MG 24 hr tablet Take 3 tablets (75 mg total) by mouth daily. Take with or immediately following a meal. 06/16/16   [name], MD  nitroGLYCERIN (NITROSTAT) 0.4 MG SL tablet Place 1 tablet (0.4 mg total) under the tongue every 5 (five) minutes as needed for chest pain. 12/22/13   [name], MD  oxyCODONE (ROXICODONE) 15 MG immediate release tablet Take 1 tablet (15 mg total) by mouth every 8 (eight) hours as needed for pain. 10/10/16   Debbe Odea, MD  pantoprazole (PROTONIX) 40 MG tablet Take 1 tablet (40 mg total) by mouth daily. 09/16/16   [name], MD  potassium chloride SA (K-DUR,KLOR-CON) 20 MEQ tablet Take 1 tablet (20 mEq total) by mouth daily as needed. Take when Lasix is needed. 10/10/16   Debbe Odea, MD  Probiotic Product (RISA-BID PROBIOTIC) TABS Take 1 tablet by mouth twice a day    [provider]  rosuvastatin (CRESTOR) 10 MG tablet Take 10 mg by mouth at bedtime.     [provider]  tamsulosin (FLOMAX) 0.4 MG CAPS capsule Take 1 capsule (0.4 mg total) by mouth daily. Patient taking differently: Take 0.4 mg by mouth at bedtime.  09/27/16   [name], MD    Family History Family History  Problem Relation Age of Onset  . Colon cancer Son 77       deceased    Social History Social History   Tobacco Use  . Smoking status: Former Smoker    Packs/day: 1.00    Years: 11.00    Pack years: 11.00    Last attempt to quit: 01/05/1956    Years since quitting: 60.8  . Smokeless tobacco: Never Used  Substance Use Topics  . Alcohol use: No  . Drug use: No     Allergies   Patient has no known allergies.   Review of Systems Review of Systems  All other systems reviewed and are negative.    Physical Exam Updated Vital Signs BP (!) 141/55 (BP Location: Right Arm)   Pulse 73   Temp 98 F (36.7 C) (Rectal)   Resp 18   SpO2 100%   Physical Exam  Constitutional: He is oriented to  person, place, and time. He appears well-developed and well-nourished.  Non-toxic appearance. No distress.  HENT:  Head: Normocephalic and atraumatic.  Eyes: Conjunctivae, EOM and lids are normal. Pupils are equal, round, and reactive to light.  Neck: Normal range of motion. Neck supple. No tracheal deviation present. No thyroid mass present.  Cardiovascular: Normal rate, regular rhythm and normal heart sounds. Exam reveals no gallop.  No murmur heard. Pulmonary/Chest: Effort normal and breath sounds normal. No stridor. No respiratory distress. He has no decreased breath sounds. He has no wheezes. He has no rhonchi. He has no rales.  Abdominal: Soft. Normal appearance and bowel sounds are normal. He exhibits no distension. There is no tenderness. There is no rebound and no CVA tenderness.  Musculoskeletal: Normal range of motion. He exhibits no edema or tenderness.       Legs: Neurological: He is alert and oriented to person, place, and time. He has normal strength. No cranial nerve deficit or sensory deficit. GCS eye subscore is 4. GCS verbal subscore is 5. GCS motor subscore is 6.  Skin: Skin is warm and dry. No abrasion and no rash noted.  Psychiatric: He has a normal mood and affect. His speech is normal and behavior is normal.  Nursing note and vitals reviewed.    ED Treatments / Results  Labs (all labs ordered are listed, but only abnormal results are displayed) Labs Reviewed  COMPREHENSIVE METABOLIC PANEL - Abnormal; Notable for the following components:      Result Value   Sodium 131 (*)    Chloride 97 (*)    Glucose, Bld 133 (*)    BUN 41 (*)    Creatinine, Ser 1.89 (*)    Total Protein 6.2 (*)    Albumin 3.2 (*)    ALT 16 (*)    GFR calc non Af Amer 31 (*)    GFR calc Af Amer 36 (*)  All other components within normal limits  CBC - Abnormal; Notable for the following components:   WBC 18.4 (*)    RBC 3.76 (*)    Hemoglobin 9.7 (*)    HCT 30.9 (*)    MCH 25.8 (*)      All other components within normal limits  URINALYSIS, ROUTINE W REFLEX MICROSCOPIC  TROPONIN I  I-STAT CG4 LACTIC ACID, ED    EKG  EKG Interpretation None       Radiology No results found.  Procedures Procedures (including critical care time)  Medications Ordered in ED Medications  0.9 %  sodium chloride infusion ( Intravenous New Bag/Given 11/13/16 1636)     Initial Impression / Assessment and Plan / ED Course  I have reviewed the triage vital signs and the nursing notes.  Pertinent labs & imaging results that were available during my care of the patient were reviewed by me and considered in my medical decision making (see chart for details).    Patient given IV hydration here for his acute kidney injury.  We will start antibiotics for his UTI from yesterday and admitted to the hospitalist service   Final Clinical Impressions(s) / ED Diagnoses   Final diagnoses:  Chest pain    ED Discharge Orders    None       Lacretia Leigh, MD 11/13/16 1759

## 2016-11-13 NOTE — H&P (Addendum)
History and Physical  [name] XFG:[id number] DOB: 1931/04/30 DOA: 11/13/2016  Referring physician: Dr. Zenia Resides PCP: [name], MD  Outpatient Specialists: Orthopedic surgery Patient coming from: SNF  Chief Complaint: Altered mental status. Sent from orthopedic surgery office due to pulling at his foley catheter.  HPI: [name] is a 83 y.o. 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 Patient went for his 1 month f/u appointment post BKA and while in the office attempted to pull his foley catheter out. Patient is in the room accompanied by friends who state that this behavior is out of his norm. At the time of this encounter, the patient was alert and oriented x 3 and responded to all questions appropriately. He admits to burning sensation when urine flows into the indwelling foley cath, states onset was prior to today. Denies nausea, flank pain, or abdominal pain.    ED Course: Meets SIRS criteria with HR 105, wbc 18k. U/A 11/12/16 +, repeated U/A in ED with urine culture in process. CXR negative for any focal infiltrates. Creatinine 1.89 baseline creatinine 1.34. IV fluid NS started in the ED. IV ceftriaxone day#0 (11/13/16).  Review of Systems: As noted in the HPI. Review of systems are otherwise negative   Past Medical History:  Diagnosis Date  . BPH (benign prostatic hyperplasia)   . CAD (coronary artery disease)    Multivessel status post CABG 2011 - LIMA to LAD, SVG to diagonal, SVG to OM, SVG to PDA  . Cellulitis    12/15  . Chronic back pain   . Chronic diastolic CHF (congestive heart failure) ([place])   . CKD (chronic kidney disease), stage III ([place])   . COPD (chronic obstructive pulmonary disease) ([place])   . Essential hypertension   . Gastric mass    EGD 9/15  .  GERD (gastroesophageal reflux disease)   . History of DVT (deep vein thrombosis)    Postphlebitic syndrome  . History of kidney stones   . HOH (hard of hearing)   . Hx of CABG   . Hyperlipidemia   . Persistent atrial fibrillation ([place])    a. s/p DCCV 03/2016.  [name] Sleep apnea    Stop Bang score of 5  . Type 2 diabetes mellitus ([place])    Past Surgical History:  Procedure Laterality Date  . COLONOSCOPY  2004   Dr. Laural Golden: three small polyps at cecum, path unknown, external hemorrhoids  . CORONARY ARTERY BYPASS GRAFT     x5  . CORONARY ARTERY BYPASS GRAFT  2011    Social History:  reports that he quit smoking about 60 years ago. He has a 11.00 pack-year smoking history. he has never used smokeless tobacco. He reports that he does not drink alcohol or use drugs.   No Known Allergies  Family History  Problem Relation Age of Onset  . Colon cancer Son 108       deceased     Prior to Admission medications   Medication Sig Start Date End Date Taking? Authorizing Provider  acetaminophen (TYLENOL) 325 MG tablet Take 650 mg by mouth every 6 (six) hours as needed.    [provider]  Amino Acids-Protein Hydrolys (FEEDING SUPPLEMENT, PRO-STAT SUGAR FREE 64,) LIQD Take 30 mLs by mouth 2 (two) times daily between meals.    [provider]  amLODipine (NORVASC) 10 MG tablet Take 1 tablet (10 mg total) by mouth daily. 09/27/16   [name], MD  antiseptic oral rinse (BIOTENE) LIQD 15 mLs by Mouth Rinse route 4 (four) times daily.    [provider]  apixaban (ELIQUIS) 5 MG TABS tablet Take 5 mg by mouth 2 (two) times daily.    [provider]  cloNIDine (CATAPRES) 0.1 MG tablet Take 0.1 mg by mouth 2 (two) times daily.    [provider]  docusate sodium (COLACE) 100 MG capsule Take 100 mg by mouth 2 (two) times daily.    [provider]  Ferrous Sulfate (IRON) 325 (65 Fe) MG TABS Give 1 tablet by mouth once a day    [provider]  finasteride (PROSCAR) 5 MG tablet Take 5 mg daily by mouth.    [provider]  FLUoxetine (PROZAC) 20 MG/5ML solution Take 5 mLs (20 mg total) by mouth daily. 09/09/16   [name], MD  furosemide (LASIX) 40 MG tablet Take 1 tablet (40 mg total) by mouth daily as needed. 10/10/16   Debbe Odea, MD  GLUCERNA (GLUCERNA) LIQD Take 237 mLs by mouth 2 (two) times daily between meals.    [provider]  insulin aspart (NOVOLOG) 100 UNIT/ML injection Inject 2 Units into the skin 3 (three) times daily with meals. Do not administer if he will not eat > 50 % of meal 10/10/16   Debbe Odea, MD  insulin glargine (LANTUS) 100 UNIT/ML injection Inject 0.1 mLs (10 Units total) into the skin at bedtime. 10/10/16   Debbe Odea, MD  ipratropium-albuterol (DUONEB) 0.5-2.5 (3) MG/3ML SOLN Take 3 mLs by nebulization every 6 (six) hours as needed.    [provider]  irbesartan (AVAPRO) 75 MG tablet Take 1 tablet (75 mg total) by mouth daily. 10/10/16   Debbe Odea, MD  LORazepam (ATIVAN) 0.5 MG tablet Take 1 tablet (0.5 mg total) by mouth 4 (four) times daily. Patient taking differently: Take 0.5 mg by mouth 4 (four) times daily as needed for anxiety.  09/26/16   [name], MD  metoprolol succinate (TOPROL-XL) 25 MG 24 hr tablet Take 3 tablets (75 mg total) by mouth daily. Take with or immediately following a meal. 06/16/16   [name], MD  nitroGLYCERIN (NITROSTAT) 0.4 MG SL tablet Place 1 tablet (0.4 mg total) under the tongue every 5 (five) minutes as needed for chest pain. 12/22/13   [name], MD  oxyCODONE (ROXICODONE) 15 MG immediate release tablet Take 1 tablet (15 mg total) by mouth every 8 (eight) hours as needed for pain. 10/10/16   Debbe Odea, MD  pantoprazole (PROTONIX) 40 MG tablet Take 1 tablet (40 mg total) by mouth daily. 09/16/16   [name], MD  potassium chloride SA (K-DUR,KLOR-CON) 20 MEQ tablet Take 1 tablet (20 mEq total) by  mouth daily as needed. Take when Lasix is needed. 10/10/16   Debbe Odea, MD  Probiotic Product (RISA-BID PROBIOTIC) TABS Take 1 tablet by mouth twice a day    [provider]  rosuvastatin (CRESTOR) 10 MG tablet Take 10 mg by mouth at bedtime.     [provider]  tamsulosin (FLOMAX) 0.4 MG CAPS capsule Take 1 capsule (0.4 mg total) by mouth daily. Patient taking differently: Take 0.4 mg by mouth at bedtime.  09/27/16   [name], MD    Physical Exam: BP (!) 152/76   Pulse (!) 105   Temp 98 F (  36.7 C) (Rectal)   Resp 19   SpO2 90%   General:  83 year old caucasian male, well developed well nourished in NAD Eyes: Pupils are round, equal and reactive to light ENT: Mucosa is dry; no erythema or lesions appreciated Neck: No JVD or thyromegaly Cardiovascular: IRRR with no murmurs rubs or gallops  Respiratory: CTA with no wheezes, rales or rhonchi Abdomen: Soft ND NT with NBS x4 quadnts Skin: BKA with stitches on stump. Musculoskeletal: Moves right leg. Psychiatric: Mood is appropriate for condition and circumstances Neurologic: Alert and oriented x 3. No focal motor or sensory deficits.          Labs on Admission:  Basic Metabolic Panel: Recent Labs  Lab 11/13/16 1530  NA 131*  K 4.8  CL 97*  CO2 27  GLUCOSE 133*  BUN 41*  CREATININE 1.89*  CALCIUM 10.2   Liver Function Tests: Recent Labs  Lab 11/13/16 1530  AST 17  ALT 16*  ALKPHOS 104  BILITOT 0.8  PROT 6.2*  ALBUMIN 3.2*   No results for input(s): LIPASE, AMYLASE in the last 168 hours. No results for input(s): AMMONIA in the last 168 hours. CBC: Recent Labs  Lab 11/13/16 1530  WBC 18.4*  HGB 9.7*  HCT 30.9*  MCV 82.2  PLT 263   Cardiac Enzymes: Recent Labs  Lab 11/13/16 1634  TROPONINI <0.03    BNP (last 3 results) Recent Labs    09/09/16 2115 09/24/16 0021 10/20/16 0006  BNP 404.0* 476.0* 211.0*    ProBNP (last 3 results) No results for input(s): PROBNP in  the last 8760 hours.  CBG: No results for input(s): GLUCAP in the last 168 hours.  Radiological Exams on Admission: Dg Chest 2 View  Result Date: 11/13/2016 CLINICAL DATA:  Chest pain. 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 The cardiac silhouette remains mildly enlarged. Aortic atherosclerosis is noted. There is minimal atelectasis scratched of there is minimal persistent atelectasis in the left lung base. The lungs are otherwise clear. No pleural effusion or pneumothorax is identified. No acute osseous abnormality is seen. IMPRESSION: No active cardiopulmonary disease. Electronically Signed   By: Logan Bores M.D.   On: 11/13/2016 17:16    EKG: Independently reviewed. A-fib with the rate 69.  Assessment/Plan Present on Admission: . UTI (urinary tract infection)  Active Problems:   UTI (urinary tract infection)  Sepsis 2/2 complicated UTI, present on admission -UTI in the setting of urinary retention possibly from BPH -Indwelling foley catheter -IV ceftriaxone until we obtain sensitivities from urine cx -wbc 18k and HR 105 -CBC am  Altered mental status -Appears to wax and wane -Alert and oriented x3 during encounter -IV antibiotic, IV ceftriaxone and IV fluid hydration NS  AKI on CKD 3 -Cr 1.89 from 1.32 -IV fluid hydration -strict I&O's  -Monitor urine output -BMP am -Avoid nephrotoxic agents, NSAIDS  Hematuria 2/2 trauma from pulling foley catheter -CBC am -Might require mittens if persists  Chronic A-fib -Rate controlled -Eliquis  Chronic normocytic anemia -Most likely multifactorial from chronic kidney disease vs others -ferritin, iron, trans sat  Ambulatory dysfunction -PT/OT evaluate and treat -Fall precaution  Type 2 Diabetes mellitus -A1C -ISS with hypoglycemia protocol -diabetic heart healthy diet  Hyponatremia -Na+ 131 -IV fluid NS -Repeat BMP in the am    DVT prophylaxis:  Eliquis for chronic A-fib  Code Status: Do not intubate  Family Communication: No family at bedside.  Disposition Plan: Will be admitted to med/surg as observation status   Consults called: ED contacted orthopedic surgery Dr. Ninfa Linden  Admission status: Observation for IV fluid hydration and IV antibiotic for UTI, present on admission.    [name] MD [facility] Pager [phone]  If 7PM-7AM, please contact night-coverage [email or web address] Password [facility]  11/13/2016, 6:37 PM

## 2016-11-13 NOTE — Progress Notes (Signed)
Post-Op Visit Note   Patient: [name]           Date of Birth: 05-Jun-1931           MRN: [id number] Visit Date: 11/13/2016 PCP: [name], MD   Assessment & Plan: Patient's been in a skilled facility.  He returns post BKA.  He is pulling on his Foley had urinary bleeding he is Artie had a urologist and left BKA stump is healing nicely he developed shortness of breath with chest pain and some hypotension with systolic below 80 and EMS was called patient was transferred to the emergency room for evaluation.  The patient had oxygen with him but it was not turned on.  BKA stump looks good.  He can follow-up in a few weeks. Chief Complaint:  Chief Complaint  Patient presents with  . Left Leg - Routine Post Op   Visit Diagnoses:  1. Status post below knee amputation of left lower extremity ([place])   2. Gross hematuria            Secondary to pulling on his Foley catheter. 3.    Chest pain  Plan: Patient's BKA stump looks good with his saturation in the 80s BP 87/44 pulse 55 he was transferred to the emergency room for evaluation of his chest pain.  Follow-Up Instructions: Return in about 3 weeks (around 12/04/2016).   Orders:  No orders of the defined types were placed in this encounter.  No orders of the defined types were placed in this encounter.   Imaging: No results found.  PMFS History: Patient Active Problem List   Diagnosis Date Noted  . Osteomyelitis of left foot ([place]) 10/06/2016  . Acute osteomyelitis of left foot ([place]) 10/06/2016  . CHF exacerbation ([place]) 09/24/2016  . Cellulitis and abscess of foot   . HCAP (healthcare-associated pneumonia) 09/09/2016  . COPD (chronic obstructive pulmonary disease) ([place]) 09/09/2016  . CAD (coronary artery disease) 09/09/2016  . Hyperlipidemia 09/09/2016  . Type 2 diabetes mellitus ([place]) 07/24/2016  . Pressure injury of skin 06/11/2016  . Atrial fibrillation with normal ventricular rate ([place]) 06/10/2016  .  Esophageal stricture 06/09/2016  . CAD in native artery 06/08/2016  . Acute renal failure superimposed on stage 3 chronic kidney disease ([place]) 06/08/2016  . Chronic diastolic CHF (congestive heart failure) ([place]) 03/19/2016  . CAP (community acquired pneumonia) 03/19/2016  . Abnormal weight loss   . Thrush, oral 12/18/2013  . Chest pain at rest 12/18/2013  . Leukocytosis 12/18/2013  . Diabetes ([place]) 12/18/2013  . Chronic back pain 12/18/2013  . Dysphagia 12/18/2013  . Odynophagia 12/18/2013  . Gastric mass 09/07/2013  . Personal history of colonic polyps 08/05/2013  . Nausea and vomiting 08/01/2013  . UTI (lower urinary tract infection) 12/09/2012  . Lower extremity edema 06/02/2012  . Essential hypertension 06/02/2012  . History of DVT (deep vein thrombosis) 06/02/2012  . Obesity (BMI 30-39.9): BMI 31.3 06/02/2012   Past Medical History:  Diagnosis Date  . BPH (benign prostatic hyperplasia)   . CAD (coronary artery disease)    Multivessel status post CABG 2011 - LIMA to LAD, SVG to diagonal, SVG to OM, SVG to PDA  . Cellulitis    12/15  . Chronic back pain   . Chronic diastolic CHF (congestive heart failure) ([place])   . CKD (chronic kidney disease), stage III ([place])   . COPD (chronic obstructive pulmonary disease) ([place])   . Essential hypertension   . Gastric mass  EGD 9/15  . GERD (gastroesophageal reflux disease)   . History of DVT (deep vein thrombosis)    Postphlebitic syndrome  . History of kidney stones   . HOH (hard of hearing)   . Hx of CABG   . Hyperlipidemia   . Persistent atrial fibrillation ([place])    a. s/p DCCV 03/2016.  [name] Sleep apnea    Stop Bang score of 5  . Type 2 diabetes mellitus ([facility])     Family History  Problem Relation Age of Onset  . Colon cancer Son 67       deceased    Past Surgical History:  Procedure Laterality Date  . COLONOSCOPY  2004   Dr. Laural Golden: three small polyps at cecum, path unknown, external hemorrhoids  . CORONARY ARTERY  BYPASS GRAFT     x5  . CORONARY ARTERY BYPASS GRAFT  2011   Social History   Occupational History  . Occupation: Retired  Tobacco Use  . Smoking status: Former Smoker    Packs/day: 1.00    Years: 11.00    Pack years: 11.00    Last attempt to quit: 01/05/1956    Years since quitting: 60.8  . Smokeless tobacco: Never Used  Substance and Sexual Activity  . Alcohol use: No  . Drug use: No  . Sexual activity: No

## 2016-11-13 NOTE — ED Notes (Signed)
Patient transported to X-ray 

## 2016-11-13 NOTE — ED Triage Notes (Signed)
Per EMS: Pt presents from [place] [facility], where pt was being seen post-op for a left BKA done recently.  Pt was in the lobby being slightly altered, pulling at his catheter.  Pt has pulled out his catheter four times since surgery.  Pt was pulled to the back and vitals checked, Pt noted to be hypotensive (80/40).  Pt 225'J systolic upon EMS arrival.  Pt 125/60 at arrival to hospital.

## 2016-11-13 NOTE — ED Notes (Signed)
Patient given something to drink per edp approval  

## 2016-11-14 ENCOUNTER — Observation Stay (HOSPITAL_COMMUNITY): Payer: Medicare Other

## 2016-11-14 DIAGNOSIS — R4182 Altered mental status, unspecified: Secondary | ICD-10-CM | POA: Diagnosis present

## 2016-11-14 DIAGNOSIS — Z79899 Other long term (current) drug therapy: Secondary | ICD-10-CM | POA: Diagnosis not present

## 2016-11-14 DIAGNOSIS — E785 Hyperlipidemia, unspecified: Secondary | ICD-10-CM | POA: Diagnosis present

## 2016-11-14 DIAGNOSIS — I130 Hypertensive heart and chronic kidney disease with heart failure and stage 1 through stage 4 chronic kidney disease, or unspecified chronic kidney disease: Secondary | ICD-10-CM | POA: Diagnosis present

## 2016-11-14 DIAGNOSIS — N390 Urinary tract infection, site not specified: Secondary | ICD-10-CM | POA: Diagnosis present

## 2016-11-14 DIAGNOSIS — E871 Hypo-osmolality and hyponatremia: Secondary | ICD-10-CM | POA: Diagnosis present

## 2016-11-14 DIAGNOSIS — Z89512 Acquired absence of left leg below knee: Secondary | ICD-10-CM | POA: Diagnosis not present

## 2016-11-14 DIAGNOSIS — R079 Chest pain, unspecified: Secondary | ICD-10-CM | POA: Diagnosis not present

## 2016-11-14 DIAGNOSIS — Z7901 Long term (current) use of anticoagulants: Secondary | ICD-10-CM | POA: Diagnosis not present

## 2016-11-14 DIAGNOSIS — Z86718 Personal history of other venous thrombosis and embolism: Secondary | ICD-10-CM | POA: Diagnosis not present

## 2016-11-14 DIAGNOSIS — Z951 Presence of aortocoronary bypass graft: Secondary | ICD-10-CM | POA: Diagnosis not present

## 2016-11-14 DIAGNOSIS — N183 Chronic kidney disease, stage 3 (moderate): Secondary | ICD-10-CM | POA: Diagnosis present

## 2016-11-14 DIAGNOSIS — E1122 Type 2 diabetes mellitus with diabetic chronic kidney disease: Secondary | ICD-10-CM | POA: Diagnosis present

## 2016-11-14 DIAGNOSIS — K219 Gastro-esophageal reflux disease without esophagitis: Secondary | ICD-10-CM | POA: Diagnosis present

## 2016-11-14 DIAGNOSIS — J449 Chronic obstructive pulmonary disease, unspecified: Secondary | ICD-10-CM | POA: Diagnosis present

## 2016-11-14 DIAGNOSIS — R0602 Shortness of breath: Secondary | ICD-10-CM | POA: Diagnosis not present

## 2016-11-14 DIAGNOSIS — I2510 Atherosclerotic heart disease of native coronary artery without angina pectoris: Secondary | ICD-10-CM | POA: Diagnosis present

## 2016-11-14 DIAGNOSIS — N179 Acute kidney failure, unspecified: Secondary | ICD-10-CM | POA: Diagnosis present

## 2016-11-14 DIAGNOSIS — Z794 Long term (current) use of insulin: Secondary | ICD-10-CM | POA: Diagnosis not present

## 2016-11-14 DIAGNOSIS — Z87442 Personal history of urinary calculi: Secondary | ICD-10-CM | POA: Diagnosis not present

## 2016-11-14 DIAGNOSIS — M549 Dorsalgia, unspecified: Secondary | ICD-10-CM | POA: Diagnosis present

## 2016-11-14 DIAGNOSIS — N400 Enlarged prostate without lower urinary tract symptoms: Secondary | ICD-10-CM | POA: Diagnosis present

## 2016-11-14 DIAGNOSIS — G9341 Metabolic encephalopathy: Secondary | ICD-10-CM | POA: Diagnosis present

## 2016-11-14 DIAGNOSIS — G4730 Sleep apnea, unspecified: Secondary | ICD-10-CM | POA: Diagnosis present

## 2016-11-14 DIAGNOSIS — G8929 Other chronic pain: Secondary | ICD-10-CM | POA: Diagnosis present

## 2016-11-14 DIAGNOSIS — T83511A Infection and inflammatory reaction due to indwelling urethral catheter, initial encounter: Secondary | ICD-10-CM | POA: Diagnosis not present

## 2016-11-14 DIAGNOSIS — E1121 Type 2 diabetes mellitus with diabetic nephropathy: Secondary | ICD-10-CM | POA: Diagnosis present

## 2016-11-14 DIAGNOSIS — I481 Persistent atrial fibrillation: Secondary | ICD-10-CM | POA: Diagnosis present

## 2016-11-14 LAB — CBC
HCT: 30.1 % — ABNORMAL LOW (ref 39.0–52.0)
HEMOGLOBIN: 9.5 g/dL — AB (ref 13.0–17.0)
MCH: 26 pg (ref 26.0–34.0)
MCHC: 31.6 g/dL (ref 30.0–36.0)
MCV: 82.2 fL (ref 78.0–100.0)
PLATELETS: 254 10*3/uL (ref 150–400)
RBC: 3.66 MIL/uL — AB (ref 4.22–5.81)
RDW: 13.5 % (ref 11.5–15.5)
WBC: 16.1 10*3/uL — AB (ref 4.0–10.5)

## 2016-11-14 LAB — GLUCOSE, CAPILLARY
GLUCOSE-CAPILLARY: 174 mg/dL — AB (ref 65–99)
Glucose-Capillary: 190 mg/dL — ABNORMAL HIGH (ref 65–99)
Glucose-Capillary: 194 mg/dL — ABNORMAL HIGH (ref 65–99)
Glucose-Capillary: 158 mg/dL — ABNORMAL HIGH (ref 65–99)

## 2016-11-14 LAB — BASIC METABOLIC PANEL
Anion gap: 10 (ref 5–15)
BUN: 34 mg/dL — AB (ref 6–20)
CO2: 24 mmol/L (ref 22–32)
CREATININE: 1.6 mg/dL — AB (ref 0.61–1.24)
Calcium: 9.9 mg/dL (ref 8.9–10.3)
Chloride: 100 mmol/L — ABNORMAL LOW (ref 101–111)
GFR calc Af Amer: 44 mL/min — ABNORMAL LOW (ref >60)
GFR, EST NON AFRICAN AMERICAN: 38 mL/min — AB (ref >60)
GLUCOSE: 166 mg/dL — AB (ref 65–99)
Potassium: 4 mmol/L (ref 3.5–5.1)
SODIUM: 134 mmol/L — AB (ref 135–145)

## 2016-11-14 LAB — HEMOGLOBIN A1C
HEMOGLOBIN A1C: 8 % — AB (ref 4.8–5.6)
MEAN PLASMA GLUCOSE: 182.9 mg/dL

## 2016-11-14 LAB — TYPE AND SCREEN
ABO/RH(D): O NEG
Antibody Screen: NEGATIVE

## 2016-11-14 MED ORDER — PANTOPRAZOLE SODIUM 40 MG PO TBEC
40.0000 mg | DELAYED_RELEASE_TABLET | Freq: Every day | ORAL | Status: DC
Start: 2016-11-14 — End: 2016-11-19
  Administered 2016-11-14 – 2016-11-19 (×6): 40 mg via ORAL
  Filled 2016-11-14 (×6): qty 1

## 2016-11-14 MED ORDER — SENNA 8.6 MG PO TABS
1.0000 | ORAL_TABLET | Freq: Every day | ORAL | Status: DC
  Administered 2016-11-14 – 2016-11-19 (×6): 8.6 mg via ORAL
  Filled 2016-11-14 (×6): qty 1

## 2016-11-14 MED ORDER — DOCUSATE SODIUM 100 MG PO CAPS
100.0000 mg | ORAL_CAPSULE | Freq: Two times a day (BID) | ORAL | Status: DC
  Administered 2016-11-14 – 2016-11-19 (×11): 100 mg via ORAL
  Filled 2016-11-14 (×11): qty 1

## 2016-11-14 MED ORDER — AMLODIPINE BESYLATE 10 MG PO TABS
10.0000 mg | ORAL_TABLET | Freq: Every day | ORAL | Status: DC
  Administered 2016-11-14 – 2016-11-19 (×6): 10 mg via ORAL
  Filled 2016-11-14 (×6): qty 1

## 2016-11-14 MED ORDER — BISACODYL 5 MG PO TBEC: 5.0000 mg | DELAYED_RELEASE_TABLET | Freq: Every day | ORAL | Status: DC | PRN

## 2016-11-14 MED ORDER — INSULIN GLARGINE 100 UNIT/ML ~~LOC~~ SOLN
10.0000 [IU] | Freq: Every day | SUBCUTANEOUS | Status: DC
  Administered 2016-11-14 – 2016-11-16 (×3): 10 [IU] via SUBCUTANEOUS
  Filled 2016-11-14 (×4): qty 0.1

## 2016-11-14 MED ORDER — APIXABAN 5 MG PO TABS
5.0000 mg | ORAL_TABLET | Freq: Two times a day (BID) | ORAL | Status: DC
  Administered 2016-11-14 – 2016-11-19 (×11): 5 mg via ORAL
  Filled 2016-11-14 (×11): qty 1

## 2016-11-14 MED ORDER — POLYETHYLENE GLYCOL 3350 17 G PO PACK
17.0000 g | PACK | Freq: Every day | ORAL | Status: DC
Start: 2016-11-14 — End: 2016-11-19
  Administered 2016-11-14 – 2016-11-19 (×6): 17 g via ORAL
  Filled 2016-11-14 (×6): qty 1

## 2016-11-14 MED ORDER — ACETAMINOPHEN 325 MG PO TABS
650.0000 mg | ORAL_TABLET | Freq: Four times a day (QID) | ORAL | Status: DC | PRN
  Administered 2016-11-15 – 2016-11-19 (×5): 650 mg via ORAL
  Filled 2016-11-14 (×6): qty 2

## 2016-11-14 MED ORDER — BISACODYL 10 MG RE SUPP
10.0000 mg | Freq: Once | RECTAL | Status: AC
  Administered 2016-11-15: 10 mg via RECTAL
  Filled 2016-11-14: qty 1

## 2016-11-14 MED ORDER — IRBESARTAN 75 MG PO TABS
75.0000 mg | ORAL_TABLET | Freq: Every day | ORAL | Status: DC
  Administered 2016-11-14 – 2016-11-19 (×6): 75 mg via ORAL
  Filled 2016-11-14 (×6): qty 1

## 2016-11-14 MED ORDER — INSULIN ASPART 100 UNIT/ML ~~LOC~~ SOLN
2.0000 [IU] | Freq: Three times a day (TID) | SUBCUTANEOUS | Status: DC
  Administered 2016-11-14 – 2016-11-19 (×9): 2 [IU] via SUBCUTANEOUS

## 2016-11-14 MED ORDER — HYDROCODONE-ACETAMINOPHEN 5-325 MG PO TABS
1.0000 | ORAL_TABLET | ORAL | Status: DC | PRN
  Administered 2016-11-14 – 2016-11-18 (×18): 1 via ORAL
  Filled 2016-11-14 (×18): qty 1

## 2016-11-14 MED ORDER — CLONIDINE HCL 0.1 MG PO TABS
0.1000 mg | ORAL_TABLET | Freq: Two times a day (BID) | ORAL | Status: DC
  Administered 2016-11-14 – 2016-11-19 (×11): 0.1 mg via ORAL
  Filled 2016-11-14 (×11): qty 1

## 2016-11-14 MED ORDER — MORPHINE SULFATE (PF) 4 MG/ML IV SOLN
1.0000 mg | Freq: Once | INTRAVENOUS | Status: AC
  Administered 2016-11-14: 1 mg via INTRAVENOUS
  Filled 2016-11-14: qty 1

## 2016-11-14 MED ORDER — BIOTENE DRY MOUTH MT LIQD
15.0000 mL | Freq: Four times a day (QID) | OROMUCOSAL | Status: DC
  Administered 2016-11-14 – 2016-11-19 (×9): 15 mL via OROMUCOSAL
  Filled 2016-11-14: qty 15
  Filled 2016-11-14: qty 473

## 2016-11-14 MED ORDER — METOPROLOL SUCCINATE ER 50 MG PO TB24
75.0000 mg | ORAL_TABLET | Freq: Every day | ORAL | Status: DC
  Administered 2016-11-14 – 2016-11-19 (×6): 75 mg via ORAL
  Filled 2016-11-14 (×6): qty 1

## 2016-11-14 MED ORDER — TAMSULOSIN HCL 0.4 MG PO CAPS
0.4000 mg | ORAL_CAPSULE | Freq: Every day | ORAL | Status: DC
  Administered 2016-11-14 – 2016-11-19 (×6): 0.4 mg via ORAL
  Filled 2016-11-14 (×6): qty 1

## 2016-11-14 MED ORDER — FLUOXETINE HCL 20 MG/5ML PO SOLN
20.0000 mg | Freq: Every day | ORAL | Status: DC
  Filled 2016-11-14 (×2): qty 5

## 2016-11-14 MED ORDER — BOOST / RESOURCE BREEZE PO LIQD
237.0000 mL | Freq: Two times a day (BID) | ORAL | Status: DC
  Administered 2016-11-15: 237 mL via ORAL
  Administered 2016-11-16: 1 via ORAL
  Administered 2016-11-16: 10:00:00 via ORAL
  Administered 2016-11-19: 237 mL via ORAL

## 2016-11-14 MED ORDER — PRO-STAT SUGAR FREE PO LIQD
30.0000 mL | Freq: Two times a day (BID) | ORAL | Status: DC
  Administered 2016-11-14 – 2016-11-17 (×6): 30 mL via ORAL
  Administered 2016-11-19: 10:00:00 via ORAL
  Administered 2016-11-19: 30 mL via ORAL
  Filled 2016-11-14 (×7): qty 30

## 2016-11-14 MED ORDER — IPRATROPIUM-ALBUTEROL 0.5-2.5 (3) MG/3ML IN SOLN
3.0000 mL | Freq: Four times a day (QID) | RESPIRATORY_TRACT | Status: DC | PRN
  Administered 2016-11-18 – 2016-11-19 (×2): 3 mL via RESPIRATORY_TRACT
  Filled 2016-11-14 (×3): qty 3

## 2016-11-14 MED ORDER — ROSUVASTATIN CALCIUM 10 MG PO TABS
10.0000 mg | ORAL_TABLET | Freq: Every day | ORAL | Status: DC
  Administered 2016-11-14 – 2016-11-18 (×5): 10 mg via ORAL
  Filled 2016-11-14 (×5): qty 1

## 2016-11-14 MED ORDER — LORAZEPAM 0.5 MG PO TABS
0.5000 mg | ORAL_TABLET | Freq: Four times a day (QID) | ORAL | Status: DC
  Administered 2016-11-14 – 2016-11-19 (×20): 0.5 mg via ORAL
  Filled 2016-11-14 (×20): qty 1

## 2016-11-14 MED ORDER — FINASTERIDE 5 MG PO TABS
5.0000 mg | ORAL_TABLET | Freq: Every day | ORAL | Status: DC
  Administered 2016-11-14 – 2016-11-19 (×6): 5 mg via ORAL
  Filled 2016-11-14 (×6): qty 1

## 2016-11-14 NOTE — Progress Notes (Addendum)
Upon patients arrival to unit he was noted to have bright red blood with multiple clots in bag and line. There was also leaking around the tip of foley.  1300 in urine has been emptied so far.  Patient also complains of pain to groin area and has begun calling out. On call paged and 1mg  of morphine once was ordered. Will continue to monitor.  Kathryne Ramella K, RN    During patients assessment a stage 2 was noted on bottom. Moisture associated skin damage was noted on bottom and groin area. Patient also had multiple scabs on neck and bilateral upper extremities. Some bleeding from patient scratching off scabs. Patient is a recent left BKA with stitches still in tact.

## 2016-11-14 NOTE — Progress Notes (Signed)
Patient ID: [name], male   DOB: 1931-03-15, 83 y.o.   MRN: [id number]  PROGRESS NOTE    [name]  BDZ:[id number] DOB: 01-Nov-1931 DOA: 11/13/2016  PCP: [name], MD   Brief Narrative:  83 year old male with chronic A fib on Eliquis, DM, CKD stage 3, below knee amputation who presented to ED 11/9 from orthopedic surgery office after he pulled his foley cathter out and per family report he has had behavioral changes recently. On admission, UA showed leukocytes. CXR showed no acute infiltrates.   Assessment & Plan:   Active Problems:   UTI (urinary tract infection) / Leukocytosis - Pt started on IV rocephin - Follow up urine culture results - Had reports per RN of some clots in foley cathter but now urine clear - CT abd with no acute findings    CKD stage 3 - Monitor daily BMP    Diabetes mellitus with diabetic nephropathy - Continue present insulin regimen    DVT prophylaxis: Eliquis Code Status: full code  Family Communication: no family at the bedside  Disposition Plan: not yet stable for discahrge    Consultants:   None   Procedures:  None   Antimicrobials:   Rocephin 11/9 -->   Subjective: No overnight events.  Objective: Vitals:   11/13/16 1915 11/13/16 2007 11/14/16 0505 11/14/16 1400  BP: 130/66 (!) 160/81 (!) 184/74 (!) 189/72  Pulse: 65 75 75 88  Resp: (!) 22 20 (!) 21 20  Temp:  97.6 F (36.4 C) (!) 97.3 F (36.3 C) 98.1 F (36.7 C)  TempSrc:   Oral Oral  SpO2: 100% 100% 99% 98%  Weight:  80.1 kg (176 lb 9.4 oz)    Height:  5\' 11"  (1.803 m)      Intake/Output Summary (Last 24 hours) at 11/14/2016 1805 Last data filed at 11/14/2016 1802 Gross per 24 hour  Intake 2950 ml  Output 3800 ml  Net -850 ml   Filed Weights   11/13/16 2007  Weight: 80.1 kg (176 lb 9.4 oz)    Examination:  General exam: Appears calm and comfortable  Respiratory system: Clear to auscultation. Respiratory effort normal. Cardiovascular  system: S1 & S2 heard, RRR.  Gastrointestinal system: Abdomen is nondistended, soft and nontender. No organomegaly or masses felt. Normal bowel sounds heard. Central nervous system: Confused, no focal deficit  Extremities: BKA (L) Skin: No rashes, lesions or ulcers Psychiatry: Little restless, not agitated   Data Reviewed: I have personally reviewed following labs and imaging studies  CBC: Recent Labs  Lab 11/13/16 1530 11/14/16 0438  WBC 18.4* 16.1*  HGB 9.7* 9.5*  HCT 30.9* 30.1*  MCV 82.2 82.2  PLT 263 341   Basic Metabolic Panel: Recent Labs  Lab 11/13/16 1530 11/14/16 0438  NA 131* 134*  K 4.8 4.0  CL 97* 100*  CO2 27 24  GLUCOSE 133* 166*  BUN 41* 34*  CREATININE 1.89* 1.60*  CALCIUM 10.2 9.9   GFR: Estimated Creatinine Clearance: 37.3 mL/min (A) (by C-G formula based on SCr of 1.6 mg/dL (H)). Liver Function Tests: Recent Labs  Lab 11/13/16 1530  AST 17  ALT 16*  ALKPHOS 104  BILITOT 0.8  PROT 6.2*  ALBUMIN 3.2*   No results for input(s): LIPASE, AMYLASE in the last 168 hours. No results for input(s): AMMONIA in the last 168 hours. Coagulation Profile: No results for input(s): INR, PROTIME in the last 168 hours. Cardiac Enzymes: Recent Labs  Lab 11/13/16 1634  TROPONINI <0.03   BNP (last 3 results) No results for input(s): PROBNP in the last 8760 hours. HbA1C: Recent Labs    11/14/16 0438  HGBA1C 8.0*   CBG: Recent Labs  Lab 11/13/16 2015 11/14/16 0739 11/14/16 1208 11/14/16 1654  GLUCAP 135* 174* 190* 194*   Lipid Profile: No results for input(s): CHOL, HDL, LDLCALC, TRIG, CHOLHDL, LDLDIRECT in the last 72 hours. Thyroid Function Tests: No results for input(s): TSH, T4TOTAL, FREET4, T3FREE, THYROIDAB in the last 72 hours. Anemia Panel: No results for input(s): VITAMINB12, FOLATE, FERRITIN, TIBC, IRON, RETICCTPCT in the last 72 hours. Urine analysis:    Component Value Date/Time   COLORURINE RED (A) 11/13/2016 1852    APPEARANCEUR CLOUDY (A) 11/13/2016 1852   APPEARANCEUR Cloudy (A) 11/12/2016 1101   LABSPEC 1.005 11/13/2016 1852   PHURINE 6.0 11/13/2016 1852   GLUCOSEU NEGATIVE 11/13/2016 1852   HGBUR LARGE (A) 11/13/2016 1852   BILIRUBINUR NEGATIVE 11/13/2016 1852   BILIRUBINUR Negative 11/12/2016 1101   KETONESUR NEGATIVE 11/13/2016 1852   PROTEINUR 100 (A) 11/13/2016 1852   UROBILINOGEN 0.2 12/19/2013 1905   NITRITE NEGATIVE 11/13/2016 1852   LEUKOCYTESUR SMALL (A) 11/13/2016 1852   LEUKOCYTESUR 2+ (A) 11/12/2016 1101   Sepsis Labs: @LABRCNTIP (procalcitonin:4,lacticidven:4)   ) Recent Results (from the past 240 hour(s))  Microscopic Examination     Status: Abnormal   Collection Time: 11/12/16 11:01 AM  Result Value Ref Range Status   WBC, UA 11-30 (A) 0 - 5 /hpf Final   RBC, UA >30 (H) 0 - 2 /hpf Final   Epithelial Cells (non renal) None seen 0 - 10 /hpf Final   Mucus, UA Present (A) Not Estab. Final   Bacteria, UA Moderate (A) None seen/Few Final      Radiology Studies: Ct Abdomen Pelvis Wo Contrast  Result Date: 11/14/2016 CLINICAL DATA:  Hematuria EXAM: CT ABDOMEN AND PELVIS WITHOUT CONTRAST TECHNIQUE: Multidetector CT imaging of the abdomen and pelvis was performed following the standard protocol without IV contrast. COMPARISON:  06/10/2016 FINDINGS: Lower chest: Dependent atelectasis. Hepatobiliary: Tiny layering gallstones. Liver is unremarkable. Stable. Pancreas: Within normal limits. Spleen: Unremarkable. Adrenals/Urinary Tract: Bilateral perinephric stranding is stable. No hydronephrosis. No urinary calculus. Adrenal glands are within normal limits. Foley catheter decompresses the bladder. Bladder is decompressed. Prostate is enlarged. 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 Duodenum is decompressed. There is no evidence of small-bowel obstruction. Normal appendix. No obvious mass in the colon. There is prominent stool burden in the rectum. Vascular/Lymphatic:  Atherosclerotic calcifications of the aorta and iliac vasculature. No abnormal retroperitoneal adenopathy by measurement criteria. Reproductive: Prostate is enlarged. Other: No free-fluid. Musculoskeletal: Stable L1 compression fracture. IMPRESSION: Cholelithiasis. Prostate is enlarged. Foley catheter decompresses the bladder. No evidence of ureteral obstruction or ureteral calculus. Electronically Signed   By: [name] M.D.   On: 11/14/2016 14:06   Dg Chest 2 View  Result Date: 11/13/2016 CLINICAL DATA:  Chest pain. 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 The cardiac silhouette remains mildly enlarged. Aortic atherosclerosis is noted. There is minimal atelectasis scratched of there is minimal persistent atelectasis in the left lung base. The lungs are otherwise clear. No pleural effusion or pneumothorax is identified. No acute osseous abnormality is seen. IMPRESSION: No active cardiopulmonary disease. Electronically Signed   By: Logan Bores M.D.   On: 11/13/2016 17:16        Scheduled Meds: . amLODipine  10 mg Oral  Daily  . antiseptic oral rinse  15 mL Mouth Rinse QID  . apixaban  5 mg Oral BID  . cloNIDine  0.1 mg Oral BID  . docusate sodium  100 mg Oral BID  . feeding supplement  237 mL Oral BID BM  . feeding supplement (PRO-STAT SUGAR FREE 64)  30 mL Oral BID BM  . finasteride  5 mg Oral Daily  . FLUoxetine  20 mg Oral Daily  . insulin aspart  0-9 Units Subcutaneous TID WC  . insulin aspart  2 Units Subcutaneous TID WC  . insulin glargine  10 Units Subcutaneous QHS  . irbesartan  75 mg Oral Daily  . LORazepam  0.5 mg Oral QID  . metoprolol succinate  75 mg Oral Daily  . pantoprazole  40 mg Oral Daily  . polyethylene glycol  17 g Oral Daily  . rosuvastatin  10 mg Oral QHS  . senna  1 tablet Oral Daily  . tamsulosin  0.4 mg Oral Daily   Continuous Infusions: . sodium chloride 125 mL/hr at 11/14/16 1052  . cefTRIAXone  (ROCEPHIN)  IV Stopped (11/13/16 1947)     LOS: 0 days    Time spent: 25 minutes  Greater than 50% of the time spent on counseling and coordinating the care.   Leisa Lenz, MD [facility] Pager [phone]  If 7PM-7AM, please contact night-coverage [email or web address] Password [email or web address] 11/14/2016, 6:05 PM

## 2016-11-14 NOTE — Evaluation (Signed)
Physical Therapy Evaluation Patient Details Name: [name] MRN: [id number] DOB: 1931-01-14 Today's Date: 11/14/2016   History of Present Illness  [name] is a 83 y.o. male with medical history significant for chronic A-fib on eliquis, uncontroled type 2 diabetes, CKD stage 3, below the knee amputation post limb infection, who presented to the ED at [facility] 11/13/16 sent from his orthopedic surgeon office. Patient went for his 1 month f/u appointment post BKA and while in the office attempted to pull his foley catheter out. Patient is in the room accompanied by friends who state that this behavior is out of his norm. found to have UTI  Clinical Impression   Pt admitted with above diagnosis. Pt currently with functional limitations due to the deficits listed below (see PT Problem List). Pain limiting activity tolerance today; Agree with return to SNF for continuing rehab;  Pt will benefit from skilled PT to increase their independence and safety with mobility to allow discharge to the venue listed below.       Follow Up Recommendations SNF    Equipment Recommendations  Other (comment)(TBD at SNF)    Recommendations for Other Services       Precautions / Restrictions Precautions Precautions: Fall      Mobility  Bed Mobility Overal bed mobility: Needs Assistance Bed Mobility: Supine to Sit;Sit to Supine     Supine to sit: Min assist Sit to supine: Min assist   General bed mobility comments: Cues for technqiue and min handheld assist to pull to sit; very uncomfortable at belly and penis once sitting; cues to square off and sit straight, but ultimately pt requested to return to supine; RN informed  Transfers                    Ambulation/Gait                Stairs            Wheelchair Mobility    Modified Rankin (Stroke Patients Only)       Balance Overall balance assessment: Needs assistance   Sitting balance-Leahy Scale: Poor                                        Pertinent Vitals/Pain Pain Assessment: Faces Faces Pain Scale: Hurts whole lot Pain Location: Stomach pain and penile pain; enough to where pt requested to lay back down Pain Descriptors / Indicators: Aching;Discomfort;Grimacing;Guarding Pain Intervention(s): Monitored during session    Home Living Family/patient expects to be discharged to:: Skilled nursing facility                      Prior Function Level of Independence: Needs assistance   Gait / Transfers Assistance Needed: Has been in rehab at SNF since BKA last month           Hand Dominance   Dominant Hand: Right    Extremity/Trunk Assessment   Upper Extremity Assessment Upper Extremity Assessment: Generalized weakness    Lower Extremity Assessment Lower Extremity Assessment: Generalized weakness;LLE deficits/detail LLE Deficits / Details: REcent BKA; able to fully extend L knee       Communication   Communication: HOH  Cognition Arousal/Alertness: Awake/alert Behavior During Therapy: WFL for tasks assessed/performed Overall Cognitive Status: No family/caregiver present to determine baseline cognitive functioning  General Comments      Exercises     Assessment/Plan    PT Assessment Patient needs continued PT services  PT Problem List Decreased strength;Decreased range of motion;Decreased activity tolerance;Decreased balance;Decreased mobility;Decreased coordination;Decreased cognition;Decreased knowledge of use of DME;Decreased safety awareness;Decreased knowledge of precautions       PT Treatment Interventions DME instruction;Gait training;Functional mobility training;Therapeutic activities;Therapeutic exercise;Balance training;Neuromuscular re-education;Cognitive remediation;Patient/family education    PT Goals (Current goals can be found in the Care Plan section)  Acute Rehab PT  Goals Patient Stated Goal: He seemed to really like company PT Goal Formulation: Patient unable to participate in goal setting Time For Goal Achievement: 11/28/16 Potential to Achieve Goals: Fair    Frequency Min 2X/week   Barriers to discharge        Co-evaluation               AM-PAC PT "6 Clicks" Daily Activity  Outcome Measure Difficulty turning over in bed (including adjusting bedclothes, sheets and blankets)?: Unable Difficulty moving from lying on back to sitting on the side of the bed? : Unable Difficulty sitting down on and standing up from a chair with arms (e.g., wheelchair, bedside commode, etc,.)?: Unable Help needed moving to and from a bed to chair (including a wheelchair)?: A Lot Help needed walking in hospital room?: A Lot Help needed climbing 3-5 steps with a railing? : Total 6 Click Score: 8    End of Session Equipment Utilized During Treatment: (Bed pad) Activity Tolerance: Patient limited by pain Patient left: in bed;with call bell/phone within reach;with bed alarm set;Other (comment)(Ava sitter in room) Nurse Communication: Mobility status PT Visit Diagnosis: Unsteadiness on feet (R26.81);Other abnormalities of gait and mobility (R26.89);Muscle weakness (generalized) (M62.81)    Time: 1610-1630 PT Time Calculation (min) (ACUTE ONLY): 20 min   Charges:   PT Evaluation $PT Eval Moderate Complexity: 1 Mod     PT G Codes:   PT G-Codes **NOT FOR INPATIENT CLASS** Functional Assessment Tool Used: Clinical judgement Functional Limitation: Mobility: Walking and moving around Mobility: Walking and Moving Around Current Status (X3244): At least 40 percent but less than 60 percent impaired, limited or restricted Mobility: Walking and Moving Around Goal Status [phone]): At least 1 percent but less than 20 percent impaired, limited or restricted    Roney Marion, [place] Pager [phone] Office [place] 11/14/2016, 5:46 PM

## 2016-11-14 NOTE — Progress Notes (Signed)
Talking with daughter about pt baseline.  At present, pt is very agitated, constantly calling and asking for something for pain.  Medicated with 1 Vicodin as ordered.

## 2016-11-14 NOTE — Progress Notes (Signed)
Pt has Air cabin crew and does much better with having a real person at bedside.  Pt needs constant reassurance and care.  Gave pt a tap water enema early this afternoon with little result yet, tried to disimpact pt but stool was up too high, hopefully the enema will help.  Pt slightly calmer now with the ativan and regular home meds on board.

## 2016-11-14 NOTE — Discharge Instructions (Signed)

## 2016-11-14 NOTE — Progress Notes (Signed)
Talking with April from [facility] where he was living PTA.  He takes Ativan 4 times a day there.  Pt is agitated and anxious, constantly calling out and needing reassurance.  Telemonitoring obtained for reassurance and for safety.  Texted Dr. Charlies Silvers for ativan.

## 2016-11-15 DIAGNOSIS — R079 Chest pain, unspecified: Secondary | ICD-10-CM

## 2016-11-15 LAB — BASIC METABOLIC PANEL
Anion gap: 9 (ref 5–15)
BUN: 23 mg/dL — AB (ref 6–20)
CALCIUM: 9.6 mg/dL (ref 8.9–10.3)
CO2: 23 mmol/L (ref 22–32)
CREATININE: 1.29 mg/dL — AB (ref 0.61–1.24)
Chloride: 99 mmol/L — ABNORMAL LOW (ref 101–111)
GFR calc Af Amer: 57 mL/min — ABNORMAL LOW (ref >60)
GFR, EST NON AFRICAN AMERICAN: 50 mL/min — AB (ref >60)
GLUCOSE: 195 mg/dL — AB (ref 65–99)
Potassium: 3.8 mmol/L (ref 3.5–5.1)
Sodium: 131 mmol/L — ABNORMAL LOW (ref 135–145)

## 2016-11-15 LAB — CBC
HCT: 29.4 % — ABNORMAL LOW (ref 39.0–52.0)
Hemoglobin: 9.4 g/dL — ABNORMAL LOW (ref 13.0–17.0)
MCH: 26.2 pg (ref 26.0–34.0)
MCHC: 32 g/dL (ref 30.0–36.0)
MCV: 81.9 fL (ref 78.0–100.0)
PLATELETS: 268 10*3/uL (ref 150–400)
RBC: 3.59 MIL/uL — ABNORMAL LOW (ref 4.22–5.81)
RDW: 13.8 % (ref 11.5–15.5)
WBC: 11.3 10*3/uL — AB (ref 4.0–10.5)

## 2016-11-15 LAB — GLUCOSE, CAPILLARY
GLUCOSE-CAPILLARY: 162 mg/dL — AB (ref 65–99)
Glucose-Capillary: 175 mg/dL — ABNORMAL HIGH (ref 65–99)
Glucose-Capillary: 141 mg/dL — ABNORMAL HIGH (ref 65–99)
Glucose-Capillary: 166 mg/dL — ABNORMAL HIGH (ref 65–99)

## 2016-11-15 LAB — URINE CULTURE: Culture: NO GROWTH

## 2016-11-15 MED ORDER — FLUOXETINE HCL 20 MG PO CAPS
20.0000 mg | ORAL_CAPSULE | Freq: Every day | ORAL | Status: DC
  Administered 2016-11-15 – 2016-11-19 (×5): 20 mg via ORAL
  Filled 2016-11-15 (×5): qty 1

## 2016-11-15 MED ORDER — MENTHOL 3 MG MT LOZG: 1.0000 | LOZENGE | OROMUCOSAL | Status: DC | PRN

## 2016-11-15 MED ORDER — BENZONATATE 100 MG PO CAPS: 100.0000 mg | ORAL_CAPSULE | Freq: Three times a day (TID) | ORAL | Status: DC | PRN

## 2016-11-15 NOTE — Progress Notes (Signed)
Pt had multiple medium hard stools night shift. Pain medicine given for complains of mild pain to lower abdomen. Blood clots and Red colored urine noted to foley still.

## 2016-11-15 NOTE — Progress Notes (Signed)
Spoke with pts daughter, Velva Harman, who states her father, on numerous occasions had stated to her that he did not want to be put on a "breathing tube" if ever needed.  He was OK, however, with CPR and other treatments consistent with the current designation "partial code" that is on the chart at present.

## 2016-11-15 NOTE — Progress Notes (Addendum)
PROGRESS NOTE    [name]  EXB:[id number] DOB: [birth year] DOA: 11/13/2016 PCP: [name], MD    Brief Narrative:  83 year old male with chronic A fib on Eliquis, DM, CKD stage 3, below knee amputation who presented to ED 11/9 from orthopedic surgery office after he pulled his foley cathter out and per family report he has had behavioral changes recently. On admission, UA showed leukocytes. CXR showed no acute infiltrates.  Assessment & Plan:   Active Problems:   UTI (urinary tract infection)   UTI (urinary tract infection) / Leukocytosis - Pt started on IV rocephin - Follow up urine culture results - Had reports per RN of some clots in foley cathter but now urine clear - Hematuria was noted in patient's previous urology note - CT abd with no acute findings -Follow-up on urine culture drawn on 11/12/2016 -White blood cell count appears to be improving    CKD stage 3 - Monitor daily BMP -Creatinine appears to be improving  Cough -Will order Tessalon Perles, and Cepacol cough drops We will order flutter valve  Abdominal pain -Likely due to constipation -CT abdomen with no acute findings - Follow-up patient's abdominal pain in a.m. as patient has received enema this morning    Diabetes mellitus with diabetic nephropathy - Continue present insulin regimen    DVT prophylaxis: Eliquis Code Status: partial code  Family Communication: no family at the bedside, called patient's daughter who did not pick up the phone left message  Disposition Plan: not yet stable for discharge    Consultants:   None  Procedures:   None  Antimicrobials:   Rocephin 11/9-->   Subjective: Patient seen and examined this morning.  He is on the bedside commode.  Per nursing staff he has had numerous stools overnight.  He did receive an enema this morning.  Patient reports still having some abdominal pain but this is improved since starting to have bowel  movements.  Objective: Vitals:   11/14/16 1400 11/14/16 2054 11/15/16 0640 11/15/16 1516  BP: (!) 189/72 136/61 131/66 130/62  Pulse: 88 69 74 76  Resp: 20 20 20 20   Temp: 98.1 F (36.7 C) 97.6 F (36.4 C) 98.1 F (36.7 C) 98.2 F (36.8 C)  TempSrc: Oral Oral Oral Oral  SpO2: 98% 98% 98% 98%  Weight:      Height:        Intake/Output Summary (Last 24 hours) at 11/15/2016 1600 Last data filed at 11/15/2016 1516 Gross per 24 hour  Intake 2195 ml  Output 1800 ml  Net 395 ml   Filed Weights   11/13/16 2007  Weight: 80.1 kg (176 lb 9.4 oz)    Examination:  General exam: Appears calm and comfortable  Respiratory system: Clear to auscultation. Respiratory effort normal. Cardiovascular system: S1 & S2 heard, RRR. No JVD, murmurs, rubs, gallops or clicks. No pedal edema. Gastrointestinal system: Abdomen is nondistended, soft and nontender. Hernia of the right lateral abdomen present but nontender. No organomegaly or masses felt. Normal bowel sounds heard. Central nervous system: Alert. No focal neurological deficits. Extremities: Symmetric 5 x 5 power. Skin: No rashes, lesions or ulcers Psychiatry: Judgement and insight appear normal. Mood & affect appropriate.     Data Reviewed: I have personally reviewed following labs and imaging studies  CBC: Recent Labs  Lab 11/13/16 1530 11/14/16 0438 11/15/16 0501  WBC 18.4* 16.1* 11.3*  HGB 9.7* 9.5* 9.4*  HCT 30.9* 30.1* 29.4*  MCV 82.2 82.2 81.9  PLT 263 254 332   Basic Metabolic Panel: Recent Labs  Lab 11/13/16 1530 11/14/16 0438 11/15/16 0501  NA 131* 134* 131*  K 4.8 4.0 3.8  CL 97* 100* 99*  CO2 27 24 23   GLUCOSE 133* 166* 195*  BUN 41* 34* 23*  CREATININE 1.89* 1.60* 1.29*  CALCIUM 10.2 9.9 9.6   GFR: Estimated Creatinine Clearance: 46.2 mL/min (A) (by C-G formula based on SCr of 1.29 mg/dL (H)). Liver Function Tests: Recent Labs  Lab 11/13/16 1530  AST 17  ALT 16*  ALKPHOS 104  BILITOT 0.8   PROT 6.2*  ALBUMIN 3.2*   No results for input(s): LIPASE, AMYLASE in the last 168 hours. No results for input(s): AMMONIA in the last 168 hours. Coagulation Profile: No results for input(s): INR, PROTIME in the last 168 hours. Cardiac Enzymes: Recent Labs  Lab 11/13/16 1634  TROPONINI <0.03   BNP (last 3 results) No results for input(s): PROBNP in the last 8760 hours. HbA1C: Recent Labs    11/14/16 0438  HGBA1C 8.0*   CBG: Recent Labs  Lab 11/14/16 1208 11/14/16 1654 11/14/16 2209 11/15/16 0816 11/15/16 1206  GLUCAP 190* 194* 158* 175* 162*   Lipid Profile: No results for input(s): CHOL, HDL, LDLCALC, TRIG, CHOLHDL, LDLDIRECT in the last 72 hours. Thyroid Function Tests: No results for input(s): TSH, T4TOTAL, FREET4, T3FREE, THYROIDAB in the last 72 hours. Anemia Panel: No results for input(s): VITAMINB12, FOLATE, FERRITIN, TIBC, IRON, RETICCTPCT in the last 72 hours. Sepsis Labs: Recent Labs  Lab 11/13/16 1625  LATICACIDVEN 1.79    Recent Results (from the past 240 hour(s))  Microscopic Examination     Status: Abnormal   Collection Time: 11/12/16 11:01 AM  Result Value Ref Range Status   WBC, UA 11-30 (A) 0 - 5 /hpf Final   RBC, UA >30 (H) 0 - 2 /hpf Final   Epithelial Cells (non renal) None seen 0 - 10 /hpf Final   Mucus, UA Present (A) Not Estab. Final   Bacteria, UA Moderate (A) None seen/Few Final  CULTURE, URINE COMPREHENSIVE     Status: None (Preliminary result)   Collection Time: 11/12/16 11:12 AM  Result Value Ref Range Status   Urine Culture, Comprehensive Preliminary report  Preliminary   Organism ID, Bacteria Comment  Preliminary    Comment: Microbiological testing to rule out the presence of possible pathogens is in progress.    Organism ID, Bacteria Comment  Preliminary    Comment: Mixed urogenital flora 10,000-25,000 colony forming units per mL   Culture, Urine     Status: None   Collection Time: 11/14/16  6:15 PM  Result Value Ref  Range Status   Specimen Description URINE, CATHETERIZED  Final   Special Requests NONE  Final   Culture NO GROWTH  Final   Report Status 11/15/2016 FINAL  Final         Radiology Studies: Ct Abdomen Pelvis Wo Contrast  Result Date: 11/14/2016 CLINICAL DATA:  Hematuria EXAM: CT ABDOMEN AND PELVIS WITHOUT CONTRAST TECHNIQUE: Multidetector CT imaging of the abdomen and pelvis was performed following the standard protocol without IV contrast. COMPARISON:  06/10/2016 FINDINGS: Lower chest: Dependent atelectasis. Hepatobiliary: Tiny layering gallstones. Liver is unremarkable. Stable. Pancreas: Within normal limits. Spleen: Unremarkable. Adrenals/Urinary Tract: Bilateral perinephric stranding is stable. No hydronephrosis. No urinary calculus. Adrenal glands are within normal limits. Foley catheter decompresses the bladder. Bladder is decompressed. Prostate is enlarged. 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 Duodenum is decompressed.  There is no evidence of small-bowel obstruction. Normal appendix. No obvious mass in the colon. There is prominent stool burden in the rectum. Vascular/Lymphatic: Atherosclerotic calcifications of the aorta and iliac vasculature. No abnormal retroperitoneal adenopathy by measurement criteria. Reproductive: Prostate is enlarged. Other: No free-fluid. Musculoskeletal: Stable L1 compression fracture. IMPRESSION: Cholelithiasis. Prostate is enlarged. Foley catheter decompresses the bladder. No evidence of ureteral obstruction or ureteral calculus. Electronically Signed   By: [name] M.D.   On: 11/14/2016 14:06   Dg Chest 2 View  Result Date: 11/13/2016 CLINICAL DATA:  Chest pain. 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 The cardiac silhouette remains mildly enlarged. Aortic atherosclerosis is noted. There is minimal atelectasis scratched of there is minimal persistent atelectasis in the left lung  base. The lungs are otherwise clear. No pleural effusion or pneumothorax is identified. No acute osseous abnormality is seen. IMPRESSION: No active cardiopulmonary disease. Electronically Signed   By: Logan Bores M.D.   On: 11/13/2016 17:16        Scheduled Meds: . amLODipine  10 mg Oral Daily  . antiseptic oral rinse  15 mL Mouth Rinse QID  . apixaban  5 mg Oral BID  . cloNIDine  0.1 mg Oral BID  . docusate sodium  100 mg Oral BID  . feeding supplement  237 mL Oral BID BM  . feeding supplement (PRO-STAT SUGAR FREE 64)  30 mL Oral BID BM  . finasteride  5 mg Oral Daily  . FLUoxetine  20 mg Oral Daily  . insulin aspart  0-9 Units Subcutaneous TID WC  . insulin aspart  2 Units Subcutaneous TID WC  . insulin glargine  10 Units Subcutaneous QHS  . irbesartan  75 mg Oral Daily  . LORazepam  0.5 mg Oral QID  . metoprolol succinate  75 mg Oral Daily  . pantoprazole  40 mg Oral Daily  . polyethylene glycol  17 g Oral Daily  . rosuvastatin  10 mg Oral QHS  . senna  1 tablet Oral Daily  . tamsulosin  0.4 mg Oral Daily   Continuous Infusions: . sodium chloride 125 mL/hr at 11/15/16 1055  . cefTRIAXone (ROCEPHIN)  IV Stopped (11/14/16 1937)     LOS: 1 day    Time spent: 35 minutes    Loretha Stapler, MD [facility] Pager [phone]  If 7PM-7AM, please contact night-coverage [email or web address] Password [email or web address] 11/15/2016, 4:00 PM

## 2016-11-16 LAB — GLUCOSE, CAPILLARY
GLUCOSE-CAPILLARY: 160 mg/dL — AB (ref 65–99)
GLUCOSE-CAPILLARY: 275 mg/dL — AB (ref 65–99)
Glucose-Capillary: 283 mg/dL — ABNORMAL HIGH (ref 65–99)
Glucose-Capillary: 230 mg/dL — ABNORMAL HIGH (ref 65–99)

## 2016-11-16 NOTE — Progress Notes (Signed)
Physical Therapy Treatment Patient Details Name: [name] MRN: [id number] DOB: [birth year] Today's Date: 11/16/2016    History of Present Illness [name] is a 83 y.o. male with medical history significant for chronic A-fib on eliquis, uncontroled type 2 diabetes, CKD stage 3, below the knee amputation post limb infection, who presented to the ED at [facility] 11/13/16 sent from his orthopedic surgeon office. Patient went for his 1 month f/u appointment post BKA and while in the office attempted to pull his foley catheter out. Patient is in the room accompanied by friends who state that this behavior is out of his norm. found to have UTI    PT Comments    Continuing work on functional mobility and activity tolerance;  Much better activity tolerance today, and that translated into much improved bed mobility; Able to tolerate standing, but quite unstable and requiring +2 for safe stand pivot transfers   Follow Up Recommendations  SNF     Equipment Recommendations  Other (comment)(TBD at SNF)    Recommendations for Other Services       Precautions / Restrictions Precautions Precautions: Fall    Mobility  Bed Mobility Overal bed mobility: Needs Assistance Bed Mobility: Supine to Sit     Supine to sit: Min guard;Min assist     General bed mobility comments: Took extra time and pt used rails; he was able to elevate trunk to sit without physical assist; min assist and use of bed pad to square off hips at EOB  Transfers Overall transfer level: Needs assistance Equipment used: Rolling walker (2 wheeled) Transfers: Sit to/from [facility] Sit to Stand: Mod assist;+2 physical assistance Stand pivot transfers: Mod assist;+2 physical assistance       General transfer comment: Heavy mod assist to rise and steady pt and RW; Difficulty with pivot, mod assist to shift weight and turn hips; pulled chair to pt as he requested to sit  Ambulation/Gait                 Stairs            Wheelchair Mobility    Modified Rankin (Stroke Patients Only)       Balance     Sitting balance-Leahy Scale: Fair       Standing balance-Leahy Scale: Poor                              Cognition Arousal/Alertness: Awake/alert Behavior During Therapy: WFL for tasks assessed/performed Overall Cognitive Status: No family/caregiver present to determine baseline cognitive functioning                                        Exercises      General Comments        Pertinent Vitals/Pain Pain Assessment: Faces Faces Pain Scale: Hurts even more Pain Location: Discomfort related to needing to urinate Pain Descriptors / Indicators: Discomfort;Grimacing Pain Intervention(s): Monitored during session    Home Living                      Prior Function            PT Goals (current goals can now be found in the care plan section) Acute Rehab PT Goals Patient Stated Goal: He seemed to really like company PT Goal Formulation: Patient  unable to participate in goal setting Time For Goal Achievement: 11/28/16 Potential to Achieve Goals: Fair Progress towards PT goals: Progressing toward goals    Frequency    Min 2X/week      PT Plan Current plan remains appropriate    Co-evaluation              AM-PAC PT "6 Clicks" Daily Activity  Outcome Measure  Difficulty turning over in bed (including adjusting bedclothes, sheets and blankets)?: A Little Difficulty moving from lying on back to sitting on the side of the bed? : A Little Difficulty sitting down on and standing up from a chair with arms (e.g., wheelchair, bedside commode, etc,.)?: Unable Help needed moving to and from a bed to chair (including a wheelchair)?: A Lot Help needed walking in hospital room?: A Lot Help needed climbing 3-5 steps with a railing? : Total 6 Click Score: 12    End of Session Equipment Utilized During  Treatment: Gait belt Activity Tolerance: Patient tolerated treatment well;Patient limited by pain Patient left: in chair;with call bell/phone within reach;with chair alarm set Nurse Communication: Mobility status PT Visit Diagnosis: Unsteadiness on feet (R26.81);Other abnormalities of gait and mobility (R26.89);Muscle weakness (generalized) (M62.81)     Time: 4132-4401 PT Time Calculation (min) (ACUTE ONLY): 18 min  Charges:  $Therapeutic Activity: 8-22 mins                    G Codes:       [name], PT  Acute Rehabilitation Services Pager [phone] Office [place] 11/16/2016, 10:56 AM

## 2016-11-16 NOTE — Progress Notes (Signed)
PROGRESS NOTE    [name]  QMG:[id number] DOB: 05-15-1931 DOA: 11/13/2016 PCP: [name], MD   Brief Narrative:  The patient is an 83 year old male with chronic A fib on Eliquis, DM, CKD stage 3, below knee amputation who presented to ED 11/9 from orthopedic surgery office after he pulled his foley cathter out and per family report he has had behavioral changes recently. On admission, UA showed leukocytes. CXR showed no acute infiltrates.  Assessment & Plan:   Active Problems:   UTI (urinary tract infection)  UTI (urinary tract infection) / Leukocytosis - Pt started on IV rocephin - Follow up urine culture results Initial showed Mixed Flora; Repeat Showed no Growth - Had reports per RN of some clots in foley cathter but now urine clear -Hematuria was noted in patient's previous urology note and likley from pullying foley  -CT abd with no acute findings -Follow-up on urine culture drawn on 11/12/2016 -White blood cell count appears to be improving as went from 18.4 -> 16.1 -> 11.3 -C/w Finasteride  AKI on CKD stage 3 -Monitor daily BMP -IVF NS dropped to 50 mL/hr -Creatinine appears to be improving as went from 1.89 -> 1.60 -> 1.29  Cough -Will order Tessalon Perles, and Cepacol cough drops -C/w Flutter Valve  Abdominal pain -Likely due to constipation -CT abdomen with no acute findings -Complaining of Rectal Pain today   Diabetes mellitus with diabetic nephropathy -Continue present insulin regimen with Lantus, Novolog, and Insulin Aspart -CBG's ranging from 160-283  Chronic A-fib -Rate controlled -Eliquis  Chronic Normocytic Anemia -Most likely multifactorial from chronic kidney disease vs others -ferritin, iron, trans sat -Hb/Hct was 9.4/29.4 -Repeat CBC in AM   Ambulatory dysfunction -PT/OT evaluate and treat; recommending SNF -Fall precaution  Hyponatremia -Na+ 131 -IV fluid NS reduced to 50 mL/hr -Repeat BMP in the am  DVT  prophylaxis: Anticoagulated with Eliquis Code Status: Partial Code Family Communication: No family present at bedside Disposition Plan: SNF at D/C  Consultants:   None   Procedures: None   Antimicrobials:  Anti-infectives (From admission, onward)   Start     Dose/Rate Route Frequency Ordered Stop   11/13/16 1815  cefTRIAXone (ROCEPHIN) 1 g in dextrose 5 % 50 mL IVPB     1 g 100 mL/hr over 30 Minutes Intravenous Every 24 hours 11/13/16 1802       Subjective: Seen and examined and complaining of rectal pain. No CP or SOB. Wanting to go home. No other concerns or complaints at this time.   Objective: Vitals:   11/15/16 0640 11/15/16 1516 11/15/16 2142 11/16/16 0550  BP: 131/66 130/62 138/73 (!) 156/70  Pulse: 74 76 71 64  Resp: 20 20 18 18   Temp: 98.1 F (36.7 C) 98.2 F (36.8 C) 98.2 F (36.8 C) 98.2 F (36.8 C)  TempSrc: Oral Oral Oral Axillary  SpO2: 98% 98% 95% 95%  Weight:      Height:        Intake/Output Summary (Last 24 hours) at 11/16/2016 2111 Last data filed at 11/16/2016 0915 Gross per 24 hour  Intake 2145.83 ml  Output 1750 ml  Net 395.83 ml   Filed Weights   11/13/16 2007  Weight: 80.1 kg (176 lb 9.4 oz)   Examination: Physical Exam:  Constitutional: WN/WD Caucasian Male in NAD and appears calm Eyes: Lids and conjunctivae normal, sclerae anicteric  ENMT: External Ears, Nose appear normal. Grossly normal hearing. Mucous membranes are moist. Neck: Appears normal, supple, no  cervical masses, normal ROM, no appreciable thyromegaly, no JVD Respiratory: Diminished to auscultation bilaterally, no wheezing, rales, rhonchi or crackles. Normal respiratory effort and patient is not tachypenic. No accessory muscle use.  Cardiovascular: Irregularly Irregular, no murmurs / rubs / gallops. S1 and S2 auscultated. No extremity edema. 2 Abdomen: Soft, non-tender, non-distended. No masses palpated. No appreciable hepatosplenomegaly. Bowel sounds positive.  GU:  Deferred. Musculoskeletal: Left BKA with Sutures intact Skin: No rashes, lesions, ulcers on a limited skin eval. No induration; Warm and dry.  Neurologic: CN 2-12 grossly intact with no focal deficits. Romberg sign and cerebellar reflexes not assessed.  Psychiatric: Normal judgment and insight. Alert and oriented x 3. Normal mood and appropriate affect.   Data Reviewed: I have personally reviewed following labs and imaging studies  CBC: Recent Labs  Lab 11/13/16 1530 11/14/16 0438 11/15/16 0501  WBC 18.4* 16.1* 11.3*  HGB 9.7* 9.5* 9.4*  HCT 30.9* 30.1* 29.4*  MCV 82.2 82.2 81.9  PLT 263 254 409   Basic Metabolic Panel: Recent Labs  Lab 11/13/16 1530 11/14/16 0438 11/15/16 0501  NA 131* 134* 131*  K 4.8 4.0 3.8  CL 97* 100* 99*  CO2 27 24 23   GLUCOSE 133* 166* 195*  BUN 41* 34* 23*  CREATININE 1.89* 1.60* 1.29*  CALCIUM 10.2 9.9 9.6   GFR: Estimated Creatinine Clearance: 46.2 mL/min (A) (by C-G formula based on SCr of 1.29 mg/dL (H)). Liver Function Tests: Recent Labs  Lab 11/13/16 1530  AST 17  ALT 16*  ALKPHOS 104  BILITOT 0.8  PROT 6.2*  ALBUMIN 3.2*   No results for input(s): LIPASE, AMYLASE in the last 168 hours. No results for input(s): AMMONIA in the last 168 hours. Coagulation Profile: No results for input(s): INR, PROTIME in the last 168 hours. Cardiac Enzymes: Recent Labs  Lab 11/13/16 1634  TROPONINI <0.03   BNP (last 3 results) No results for input(s): PROBNP in the last 8760 hours. HbA1C: Recent Labs    11/14/16 0438  HGBA1C 8.0*   CBG: Recent Labs  Lab 11/15/16 1656 11/15/16 2133 11/16/16 0746 11/16/16 1202 11/16/16 1702  GLUCAP 141* 166* 160* 275* 283*   Lipid Profile: No results for input(s): CHOL, HDL, LDLCALC, TRIG, CHOLHDL, LDLDIRECT in the last 72 hours. Thyroid Function Tests: No results for input(s): TSH, T4TOTAL, FREET4, T3FREE, THYROIDAB in the last 72 hours. Anemia Panel: No results for input(s): VITAMINB12,  FOLATE, FERRITIN, TIBC, IRON, RETICCTPCT in the last 72 hours. Sepsis Labs: Recent Labs  Lab 11/13/16 1625  LATICACIDVEN 1.79   Recent Results (from the past 240 hour(s))  Microscopic Examination     Status: Abnormal   Collection Time: 11/12/16 11:01 AM  Result Value Ref Range Status   WBC, UA 11-30 (A) 0 - 5 /hpf Final   RBC, UA >30 (H) 0 - 2 /hpf Final   Epithelial Cells (non renal) None seen 0 - 10 /hpf Final   Mucus, UA Present (A) Not Estab. Final   Bacteria, UA Moderate (A) None seen/Few Final  CULTURE, URINE COMPREHENSIVE     Status: None (Preliminary result)   Collection Time: 11/12/16 11:12 AM  Result Value Ref Range Status   Urine Culture, Comprehensive Preliminary report  Preliminary   Organism ID, Bacteria Comment  Preliminary    Comment: Microbiological testing to rule out the presence of possible pathogens is in progress.    Organism ID, Bacteria Comment  Preliminary    Comment: Mixed urogenital flora 10,000-25,000 colony forming units per mL  Culture, Urine     Status: None   Collection Time: 11/14/16  6:15 PM  Result Value Ref Range Status   Specimen Description URINE, CATHETERIZED  Final   Special Requests NONE  Final   Culture NO GROWTH  Final   Report Status 11/15/2016 FINAL  Final    Radiology Studies: No results found.   Scheduled Meds: . amLODipine  10 mg Oral Daily  . antiseptic oral rinse  15 mL Mouth Rinse QID  . apixaban  5 mg Oral BID  . cloNIDine  0.1 mg Oral BID  . docusate sodium  100 mg Oral BID  . feeding supplement  237 mL Oral BID BM  . feeding supplement (PRO-STAT SUGAR FREE 64)  30 mL Oral BID BM  . finasteride  5 mg Oral Daily  . FLUoxetine  20 mg Oral Daily  . insulin aspart  0-9 Units Subcutaneous TID WC  . insulin aspart  2 Units Subcutaneous TID WC  . insulin glargine  10 Units Subcutaneous QHS  . irbesartan  75 mg Oral Daily  . LORazepam  0.5 mg Oral QID  . metoprolol succinate  75 mg Oral Daily  . pantoprazole  40 mg  Oral Daily  . polyethylene glycol  17 g Oral Daily  . rosuvastatin  10 mg Oral QHS  . senna  1 tablet Oral Daily  . tamsulosin  0.4 mg Oral Daily   Continuous Infusions: . sodium chloride 125 mL/hr at 11/16/16 1415  . cefTRIAXone (ROCEPHIN)  IV Stopped (11/16/16 1902)     LOS: 2 days   Kerney Elbe, DO [facility] Pager [phone]  If 7PM-7AM, please contact night-coverage [email or web address] Password [email or web address] 11/16/2016, 9:11 PM

## 2016-11-16 NOTE — Clinical Social Work Note (Signed)
Clinical Social Work Assessment  Patient Details  Name: [name] MRN: [id number] Date of Birth: March 26, 1931  Date of referral:  11/16/16               Reason for consult:  Facility Placement                Permission sought to share information with:  Facility Art therapist granted to share information::  No(pt disoriented)  Name::     Christell Constant  Agency::  [facility]  Relationship::  Radio broadcast assistant Information:     Housing/Transportation Living arrangements for the past 2 months:  [place] of Information:  Adult Children Patient Interpreter Needed:    Criminal Activity/Legal Involvement Pertinent to Current Situation/Hospitalization:    Significant Relationships:  Adult Children Lives with:    Do you feel safe going back to the place where you live?  Yes Need for family participation in patient care:  Yes (Comment)(decision making)  Care giving concerns:  Pt has been at SNF for past month following surgery- no care concerns with facility at this time.   Social Worker assessment / plan:  CSW spoke with pt dtr concerning PT continued recommendation for SNF.  Pt dtr confirms that pt was at [facility] for rehab prior to admission and that plan will be for return to SNF when stable for DC.  Employment status:  Retired Nurse, adult PT Recommendations:  [place] / Referral to community resources:  [place]  Patient/Family's Response to care:  Pt dtr agreeable to plan for return to SNF when stable.  Patient/Family's Understanding of and Emotional Response to Diagnosis, Current Treatment, and Prognosis:  Dtr seems to have good understanding of pt condition and is hopeful that he will be stable for return to SNF soon so he can continue rehab.  Emotional Assessment Appearance:  Appears stated age Attitude/Demeanor/Rapport:  Unable to Assess Affect (typically  observed):  Unable to Assess Orientation:  Oriented to Self, Oriented to Place Alcohol / Substance use:  Not Applicable Psych involvement (Current and /or in the community):  No (Comment)  Discharge Needs  Concerns to be addressed:  Care Coordination Readmission within the last 30 days:  No Current discharge risk:  Physical Impairment Barriers to Discharge:  Continued Medical Work up   Jorge Ny, LCSW 11/16/2016, 11:09 AM

## 2016-11-16 NOTE — NC FL2 (Signed)
[place] MEDICAID FL2 LEVEL OF CARE SCREENING TOOL     IDENTIFICATION  Patient Name: [name] Birthdate: 1931-05-08 Sex: male Admission Date (Current Location): 11/13/2016  [facility] and [place] Number:  [employer] and Address:  [facility] [place], [place] [address] [place], [place] [address]      Provider Number: [id number]  Attending Physician Name and Address:  Kerney Elbe, DO  Relative Name and Phone Number:       Current Level of Care: Hospital Recommended Level of Care: [place] Prior Approval Number:    Date Approved/Denied:   PASRR Number: [id number]  Discharge Plan: SNF    Current Diagnoses: Patient Active Problem List   Diagnosis Date Noted  . UTI (urinary tract infection) 11/13/2016  . Osteomyelitis of left foot ([place]) 10/06/2016  . Acute osteomyelitis of left foot ([place]) 10/06/2016  . CHF exacerbation ([place]) 09/24/2016  . Cellulitis and abscess of foot   . HCAP (healthcare-associated pneumonia) 09/09/2016  . COPD (chronic obstructive pulmonary disease) ([place]) 09/09/2016  . CAD (coronary artery disease) 09/09/2016  . Hyperlipidemia 09/09/2016  . Type 2 diabetes mellitus ([place]) 07/24/2016  . Pressure injury of skin 06/11/2016  . Atrial fibrillation with normal ventricular rate ([place]) 06/10/2016  . Esophageal stricture 06/09/2016  . CAD in native artery 06/08/2016  . Acute renal failure superimposed on stage 3 chronic kidney disease ([place]) 06/08/2016  . Chronic diastolic CHF (congestive heart failure) ([place]) 03/19/2016  . CAP (community acquired pneumonia) 03/19/2016  . Abnormal weight loss   . Thrush, oral 12/18/2013  . Chest pain at rest 12/18/2013  . Leukocytosis 12/18/2013  . Diabetes ([place]) 12/18/2013  . Chronic back pain 12/18/2013  . Dysphagia 12/18/2013  . Odynophagia 12/18/2013  . Gastric mass 09/07/2013  . Personal history of colonic polyps 08/05/2013  . Nausea and vomiting 08/01/2013  .  UTI (lower urinary tract infection) 12/09/2012  . Lower extremity edema 06/02/2012  . Essential hypertension 06/02/2012  . History of DVT (deep vein thrombosis) 06/02/2012  . Obesity (BMI 30-39.9): BMI 31.3 06/02/2012    Orientation RESPIRATION BLADDER Height & Weight     Self, Place  Normal Incontinent, Indwelling catheter Weight: 176 lb 9.4 oz (80.1 kg) Height:  5\' 11"  (180.3 cm)  BEHAVIORAL SYMPTOMS/MOOD NEUROLOGICAL BOWEL NUTRITION STATUS      Incontinent Diet(see DC summary)  AMBULATORY STATUS COMMUNICATION OF NEEDS Skin   Extensive Assist Verbally PU Stage and Appropriate Care   PU Stage 2 Dressing: (on sacrum- foam dressing changed PRN)                   Personal Care Assistance Level of Assistance  Bathing, Dressing Bathing Assistance: Maximum assistance   Dressing Assistance: Maximum assistance     Functional Limitations Info             SPECIAL CARE FACTORS FREQUENCY  PT (By licensed PT), OT (By licensed OT)     PT Frequency: 5/wk OT Frequency: 5/wk            Contractures      Additional Factors Info  Code Status, Allergies, Insulin Sliding Scale, Psychotropic Code Status Info: Partial Allergies Info: NKA Psychotropic Info: prozac, ativan Insulin Sliding Scale Info: 7/day       Current Medications (11/16/2016):  This is the current hospital active medication list Current Facility-Administered Medications  Medication Dose Route Frequency Provider Last Rate Last Dose  . 0.9 %  sodium chloride infusion  Intravenous Continuous Lacretia Leigh, MD 125 mL/hr at 11/16/16 0250    . acetaminophen (TYLENOL) tablet 650 mg  650 mg Oral Q6H PRN Robbie Lis, MD   650 mg at 11/16/16 0824  . amLODipine (NORVASC) tablet 10 mg  10 mg Oral Daily Robbie Lis, MD   10 mg at 11/16/16 0953  . antiseptic oral rinse (BIOTENE) solution 15 mL  15 mL Mouth Rinse QID Robbie Lis, MD   15 mL at 11/16/16 0955  . apixaban (ELIQUIS) tablet 5 mg  5 mg Oral BID  Robbie Lis, MD   5 mg at 11/16/16 4580  . benzonatate (TESSALON) capsule 100 mg  100 mg Oral TID PRN Eber Jones, MD      . cefTRIAXone (ROCEPHIN) 1 g in dextrose 5 % 50 mL IVPB  1 g Intravenous Q24H Lacretia Leigh, MD 100 mL/hr at 11/15/16 1818 1 g at 11/15/16 1818  . cloNIDine (CATAPRES) tablet 0.1 mg  0.1 mg Oral BID Robbie Lis, MD   0.1 mg at 11/16/16 0954  . docusate sodium (COLACE) capsule 100 mg  100 mg Oral BID Robbie Lis, MD   100 mg at 11/16/16 9983  . feeding supplement (BOOST / RESOURCE BREEZE) liquid 1 Container  237 mL Oral BID BM Robbie Lis, MD      . feeding supplement (PRO-STAT SUGAR FREE 64) liquid 30 mL  30 mL Oral BID BM Robbie Lis, MD   30 mL at 11/16/16 0954  . finasteride (PROSCAR) tablet 5 mg  5 mg Oral Daily Robbie Lis, MD   5 mg at 11/16/16 0954  . FLUoxetine (PROZAC) capsule 20 mg  20 mg Oral Daily Robbie Lis, MD   20 mg at 11/16/16 0953  . HYDROcodone-acetaminophen (NORCO/VICODIN) 5-325 MG per tablet 1 tablet  1 tablet Oral Q4H PRN Robbie Lis, MD   1 tablet at 11/16/16 [phone]  . insulin aspart (novoLOG) injection 0-9 Units  0-9 Units Subcutaneous TID WC Irene Pap N, DO   2 Units at 11/16/16 0818  . insulin aspart (novoLOG) injection 2 Units  2 Units Subcutaneous TID WC Robbie Lis, MD   2 Units at 11/15/16 1715  . insulin glargine (LANTUS) injection 10 Units  10 Units Subcutaneous QHS Robbie Lis, MD   10 Units at 11/15/16 2239  . ipratropium-albuterol (DUONEB) 0.5-2.5 (3) MG/3ML nebulizer solution 3 mL  3 mL Nebulization Q6H PRN Robbie Lis, MD      . irbesartan (AVAPRO) tablet 75 mg  75 mg Oral Daily Robbie Lis, MD   75 mg at 11/16/16 0954  . LORazepam (ATIVAN) tablet 0.5 mg  0.5 mg Oral QID Robbie Lis, MD   0.5 mg at 11/16/16 0953  . menthol-cetylpyridinium (CEPACOL) lozenge 3 mg  1 lozenge Oral PRN Eber Jones, MD      . metoprolol succinate (TOPROL-XL) 24 hr tablet 75 mg  75 mg Oral Daily Robbie Lis, MD   75 mg at 11/16/16 0539  . pantoprazole (PROTONIX) EC tablet 40 mg  40 mg Oral Daily Robbie Lis, MD   40 mg at 11/16/16 0953  . polyethylene glycol (MIRALAX / GLYCOLAX) packet 17 g  17 g Oral Daily Robbie Lis, MD   17 g at 11/16/16 0955  . rosuvastatin (CRESTOR) tablet 10 mg  10 mg Oral QHS Robbie Lis, MD   10 mg at 11/15/16 2240  .  senna (SENOKOT) tablet 8.6 mg  1 tablet Oral Daily Robbie Lis, MD   8.6 mg at 11/16/16 3539  . tamsulosin (FLOMAX) capsule 0.4 mg  0.4 mg Oral Daily Robbie Lis, MD   0.4 mg at 11/16/16 1225     Discharge Medications: Please see discharge summary for a list of discharge medications.  Relevant Imaging Results:  Relevant Lab Results:   Additional Information SS#: [id number]  Jorge Ny, LCSW

## 2016-11-17 ENCOUNTER — Inpatient Hospital Stay (HOSPITAL_COMMUNITY): Payer: Medicare Other

## 2016-11-17 ENCOUNTER — Encounter (HOSPITAL_COMMUNITY): Payer: Self-pay | Admitting: General Practice

## 2016-11-17 DIAGNOSIS — R0602 Shortness of breath: Secondary | ICD-10-CM

## 2016-11-17 LAB — COMPREHENSIVE METABOLIC PANEL
ALBUMIN: 2.9 g/dL — AB (ref 3.5–5.0)
ALT: 16 U/L — AB (ref 17–63)
AST: 15 U/L (ref 15–41)
Alkaline Phosphatase: 101 U/L (ref 38–126)
Anion gap: 7 (ref 5–15)
BUN: 17 mg/dL (ref 6–20)
CHLORIDE: 104 mmol/L (ref 101–111)
CO2: 22 mmol/L (ref 22–32)
CREATININE: 1.18 mg/dL (ref 0.61–1.24)
Calcium: 9.6 mg/dL (ref 8.9–10.3)
GFR calc Af Amer: >60 mL/min (ref >60)
GFR calc non Af Amer: 55 mL/min — ABNORMAL LOW (ref >60)
GLUCOSE: 228 mg/dL — AB (ref 65–99)
Potassium: 4.1 mmol/L (ref 3.5–5.1)
SODIUM: 133 mmol/L — AB (ref 135–145)
Total Bilirubin: 0.4 mg/dL (ref 0.3–1.2)
Total Protein: 6.1 g/dL — ABNORMAL LOW (ref 6.5–8.1)

## 2016-11-17 LAB — GLUCOSE, CAPILLARY
GLUCOSE-CAPILLARY: 280 mg/dL — AB (ref 65–99)
GLUCOSE-CAPILLARY: 244 mg/dL — AB (ref 65–99)
Glucose-Capillary: 218 mg/dL — ABNORMAL HIGH (ref 65–99)
Glucose-Capillary: 238 mg/dL — ABNORMAL HIGH (ref 65–99)

## 2016-11-17 LAB — CBC WITH DIFFERENTIAL/PLATELET
BASOS ABS: 0 10*3/uL (ref 0.0–0.1)
BASOS PCT: 0 %
EOS ABS: 0.6 10*3/uL (ref 0.0–0.7)
EOS PCT: 5 %
HCT: 30.1 % — ABNORMAL LOW (ref 39.0–52.0)
Hemoglobin: 9.4 g/dL — ABNORMAL LOW (ref 13.0–17.0)
Lymphocytes Relative: 8 %
Lymphs Abs: 1 10*3/uL (ref 0.7–4.0)
MCH: 25.6 pg — ABNORMAL LOW (ref 26.0–34.0)
MCHC: 31.2 g/dL (ref 30.0–36.0)
MCV: 82 fL (ref 78.0–100.0)
Monocytes Absolute: 1 10*3/uL (ref 0.1–1.0)
Monocytes Relative: 8 %
Neutro Abs: 9.8 10*3/uL — ABNORMAL HIGH (ref 1.7–7.7)
Neutrophils Relative %: 79 %
PLATELETS: 277 10*3/uL (ref 150–400)
RBC: 3.67 MIL/uL — AB (ref 4.22–5.81)
RDW: 13.9 % (ref 11.5–15.5)
WBC: 12.4 10*3/uL — AB (ref 4.0–10.5)

## 2016-11-17 LAB — PHOSPHORUS: Phosphorus: 3.1 mg/dL (ref 2.5–4.6)

## 2016-11-17 LAB — MAGNESIUM: Magnesium: 1.6 mg/dL — ABNORMAL LOW (ref 1.7–2.4)

## 2016-11-17 MED ORDER — LORAZEPAM 1 MG PO TABS
1.0000 mg | ORAL_TABLET | Freq: Once | ORAL | Status: AC
  Administered 2016-11-17: 1 mg via ORAL
  Filled 2016-11-17: qty 1

## 2016-11-17 MED ORDER — IPRATROPIUM-ALBUTEROL 0.5-2.5 (3) MG/3ML IN SOLN
3.0000 mL | Freq: Four times a day (QID) | RESPIRATORY_TRACT | Status: DC
  Administered 2016-11-17 (×2): 3 mL via RESPIRATORY_TRACT
  Filled 2016-11-17 (×2): qty 3

## 2016-11-17 MED ORDER — INSULIN GLARGINE 100 UNIT/ML ~~LOC~~ SOLN
15.0000 [IU] | Freq: Every day | SUBCUTANEOUS | Status: DC
  Administered 2016-11-18: 15 [IU] via SUBCUTANEOUS
  Filled 2016-11-17 (×2): qty 0.15

## 2016-11-17 MED ORDER — MAGNESIUM SULFATE 2 GM/50ML IV SOLN
2.0000 g | Freq: Once | INTRAVENOUS | Status: AC
  Administered 2016-11-17: 2 g via INTRAVENOUS
  Filled 2016-11-17: qty 50

## 2016-11-17 MED ORDER — GUAIFENESIN ER 600 MG PO TB12
1200.0000 mg | ORAL_TABLET | Freq: Two times a day (BID) | ORAL | Status: DC
  Administered 2016-11-17 – 2016-11-19 (×5): 1200 mg via ORAL
  Filled 2016-11-17 (×5): qty 2

## 2016-11-17 MED ORDER — FUROSEMIDE 10 MG/ML IJ SOLN
40.0000 mg | Freq: Once | INTRAMUSCULAR | Status: AC
  Administered 2016-11-17: 40 mg via INTRAVENOUS
  Filled 2016-11-17: qty 4

## 2016-11-17 NOTE — Progress Notes (Signed)
Inpatient Diabetes Program Recommendations  AACE/ADA: New Consensus Statement on Inpatient Glycemic Control (2015)  Target Ranges:  Prepandial:   less than 140 mg/dL      Peak postprandial:   less than 180 mg/dL (1-2 hours)      Critically ill patients:  140 - 180 mg/dL   Results for ARAS, ALBARRAN (MRN [id number]) as of 11/17/2016 14:52  Ref. Range 11/16/2016 07:46 11/16/2016 12:02 11/16/2016 17:02 11/16/2016 22:10 11/17/2016 07:47 11/17/2016 12:12  Glucose-Capillary Latest Ref Range: 65 - 99 mg/dL 160 (H) 275 (H) 283 (H) 230 (H) 218 (H) 280 (H)   Review of Glycemic Control  Diabetes history: DM 2 Outpatient Diabetes medications: Lantus 10 units, Novolog 2 units tid meal coverage Current orders for Inpatient glycemic control: Lantus 10 units, Novolog Sensitive Correction 0-9 units tid + Novolog 2 units tid meal coverage.  Inpatient Diabetes Program Recommendations:    Glucose trends are elevated in the 200 range. Please consider increasing Lantus to 15 units.  Thanks,  Tama Headings RN, MSN, [facility] Inpatient Diabetes Coordinator Team Pager [phone] (8a-5p)

## 2016-11-17 NOTE — Progress Notes (Signed)
PROGRESS NOTE    [name]  AST:[id number] DOB: 28-May-1931 DOA: 11/13/2016 PCP: [name], MD   Brief Narrative:  The patient is an 83 year old male with chronic A fib on Eliquis, DM, CKD stage 3, below knee amputation who presented to ED 11/9 from orthopedic surgery office after he pulled his foley cathter out and per family report he has had behavioral changes recently. On admission, UA showed leukocytes. CXR showed no acute infiltrates but repeat CXR today showed bibasilar edema/infiltrates so IVF stopped and given 1 dose of IV Lasix.   Assessment & Plan:   Active Problems:   UTI (urinary tract infection)  UTI (urinary tract infection) / Leukocytosis - Pt started on IV rocephin - Follow up urine culture results Initial showed Mixed Flora; Repeat Showed no Growth - Had reports per RN of some clots in foley cathter but now urine clear -Hematuria was noted in patient's previous urology note and likley from pullying foley  -CT abd with no acute findings -Follow-up on urine culture drawn on 11/12/2016 -White blood cell count appeared to be improving as went from 18.4 -> 16.1 -> 11.3; Now is 12.4 -C/w Finasteride  AKI on CKD stage 3 -Monitor daily BMP -IVF NS dropped to 50 mL/hr and D/C'd Today given patient's SOB and gave IV Lasix 40 mg x 1 -Creatinine appears to be improving as went from 1.89 -> 1.60 -> 1.29 -> 1.18  Cough/SOB -Will order Tessalon Perles, and Cepacol cough drops -CXR ordered and showed New Bibasilar interstitial edema/infiltrates and stable border Cardiomegaly -Given IV Lasix 40 mg x 1 -DuoNebs Ordered  -C/w Flutter Valve  Abdominal pain -Likely due to constipation -CT abdomen with no acute findings -Complaining of Rectal Pain today   Diabetes mellitus with diabetic nephropathy -Continue present insulin regimen with Lantus (now increased to 15 mg qHS), Novolog, and Insulin Aspart -CBG's ranging from 218-280 -Diabetes Education Coordinator  Consulted   Chronic A-fib -Rate controlled -Eliquis  Chronic Normocytic Anemia -Most likely multifactorial from chronic kidney disease vs others -ferritin, iron, trans sat -Hb/Hct was 9.4/30.1 -Repeat CBC in AM   Ambulatory dysfunction -PT/OT evaluate and treat; recommending SNF -Fall precaution  Hyponatremia -Na+ 133 -IV fluid NS reduced to 50 mL/hr now D/C'd -Repeat BMP in the am  Hypertension -C/w Current Medications with Irbesartan, Clonidine, Finasteride, and Tamsulosin  DVT prophylaxis: Anticoagulated with Eliquis Code Status: Partial Code Family Communication: No family present at bedside Disposition Plan: SNF at D/C  Consultants:   None   Procedures: None   Antimicrobials:  Anti-infectives (From admission, onward)   Start     Dose/Rate Route Frequency Ordered Stop   11/13/16 1815  cefTRIAXone (ROCEPHIN) 1 g in dextrose 5 % 50 mL IVPB     1 g 100 mL/hr over 30 Minutes Intravenous Every 24 hours 11/13/16 1802       Subjective: Seen and examined and complaining SOB. No CP. Denied any lightheadedness or dizziness.    Objective: Vitals:   11/17/16 0452 11/17/16 1459 11/17/16 2000 11/17/16 2029  BP: (!) 172/77  (!) 158/72   Pulse: 78  86 81  Resp: 18  17 18   Temp: 97.8 F (36.6 C)  98 F (36.7 C)   TempSrc: Oral  Oral   SpO2: 98% 96% 97% 96%  Weight:      Height:        Intake/Output Summary (Last 24 hours) at 11/17/2016 2202 Last data filed at 11/17/2016 2001 Gross per 24 hour  Intake 476 ml  Output 3425 ml  Net -2949 ml   Filed Weights   11/13/16 2007  Weight: 80.1 kg (176 lb 9.4 oz)   Examination: Physical Exam:  Constitutional: WN/WD Caucasian male complaining of SOB Eyes: Lids Normal. Sclerae anicteric.  ENMT: External Ears and nose appear normal. Hard of Hearing. MMM Neck: Supple with no JVD Respiratory: Diminished bibasilar breath sounds. Slightly increased work of breathing Cardiovascular: Irregularly Irregular. No  extremity edema Abdomen: Soft, NT, ND. Bowel sounds present GU: Deferred Musculoskeletal: Left BKA Skin: No appreciable rashes or lesions. Left BKA sutures intact Neurologic: CN 2-12 grossly intact. No appreciable focal deficits Psychiatric: Normal mood and affect. Intact judgement and insight  Data Reviewed: I have personally reviewed following labs and imaging studies  CBC: Recent Labs  Lab 11/13/16 1530 11/14/16 0438 11/15/16 0501 11/17/16 0445  WBC 18.4* 16.1* 11.3* 12.4*  NEUTROABS  --   --   --  9.8*  HGB 9.7* 9.5* 9.4* 9.4*  HCT 30.9* 30.1* 29.4* 30.1*  MCV 82.2 82.2 81.9 82.0  PLT 263 254 268 809   Basic Metabolic Panel: Recent Labs  Lab 11/13/16 1530 11/14/16 0438 11/15/16 0501 11/17/16 0445  NA 131* 134* 131* 133*  K 4.8 4.0 3.8 4.1  CL 97* 100* 99* 104  CO2 27 24 23 22   GLUCOSE 133* 166* 195* 228*  BUN 41* 34* 23* 17  CREATININE 1.89* 1.60* 1.29* 1.18  CALCIUM 10.2 9.9 9.6 9.6  MG  --   --   --  1.6*  PHOS  --   --   --  3.1   GFR: Estimated Creatinine Clearance: 50.5 mL/min (by C-G formula based on SCr of 1.18 mg/dL). Liver Function Tests: Recent Labs  Lab 11/13/16 1530 11/17/16 0445  AST 17 15  ALT 16* 16*  ALKPHOS 104 101  BILITOT 0.8 0.4  PROT 6.2* 6.1*  ALBUMIN 3.2* 2.9*   No results for input(s): LIPASE, AMYLASE in the last 168 hours. No results for input(s): AMMONIA in the last 168 hours. Coagulation Profile: No results for input(s): INR, PROTIME in the last 168 hours. Cardiac Enzymes: Recent Labs  Lab 11/13/16 1634  TROPONINI <0.03   BNP (last 3 results) No results for input(s): PROBNP in the last 8760 hours. HbA1C: No results for input(s): HGBA1C in the last 72 hours. CBG: Recent Labs  Lab 11/16/16 1702 11/16/16 2210 11/17/16 0747 11/17/16 1212 11/17/16 1641  GLUCAP 283* 230* 218* 280* 244*   Lipid Profile: No results for input(s): CHOL, HDL, LDLCALC, TRIG, CHOLHDL, LDLDIRECT in the last 72 hours. Thyroid Function  Tests: No results for input(s): TSH, T4TOTAL, FREET4, T3FREE, THYROIDAB in the last 72 hours. Anemia Panel: No results for input(s): VITAMINB12, FOLATE, FERRITIN, TIBC, IRON, RETICCTPCT in the last 72 hours. Sepsis Labs: Recent Labs  Lab 11/13/16 1625  LATICACIDVEN 1.79   Recent Results (from the past 240 hour(s))  Microscopic Examination     Status: Abnormal   Collection Time: 11/12/16 11:01 AM  Result Value Ref Range Status   WBC, UA 11-30 (A) 0 - 5 /hpf Final   RBC, UA >30 (H) 0 - 2 /hpf Final   Epithelial Cells (non renal) None seen 0 - 10 /hpf Final   Mucus, UA Present (A) Not Estab. Final   Bacteria, UA Moderate (A) None seen/Few Final  CULTURE, URINE COMPREHENSIVE     Status: None (Preliminary result)   Collection Time: 11/12/16 11:12 AM  Result Value Ref Range Status  Urine Culture, Comprehensive Preliminary report  Preliminary   Organism ID, Bacteria Comment  Preliminary    Comment: Microbiological testing to rule out the presence of possible pathogens is in progress. Greater than 100,000 colony forming units per mL    Organism ID, Bacteria Comment  Preliminary    Comment: Mixed urogenital flora 10,000-25,000 colony forming units per mL   Culture, Urine     Status: None   Collection Time: 11/14/16  6:15 PM  Result Value Ref Range Status   Specimen Description URINE, CATHETERIZED  Final   Special Requests NONE  Final   Culture NO GROWTH  Final   Report Status 11/15/2016 FINAL  Final    Radiology Studies: Dg Chest Port 1 View  Result Date: 11/17/2016 CLINICAL DATA:  Patient having episodes of SOB for 2-3 days with abdomen pain EXAM: PORTABLE CHEST - 1 VIEW COMPARISON:  11/13/2016 FINDINGS: New bibasilar interstitial edema or infiltrates. No confluent airspace disease. Stable borderline cardiomegaly.  CABG markers.  Atheromatous aorta. No effusion.  No pneumothorax. Previous median sternotomy . IMPRESSION: 1. New bibasilar interstitial edema/infiltrates. 2. Stable  borderline cardiomegaly. Electronically Signed   By: Lucrezia Europe M.D.   On: 11/17/2016 11:24     Scheduled Meds: . amLODipine  10 mg Oral Daily  . antiseptic oral rinse  15 mL Mouth Rinse QID  . apixaban  5 mg Oral BID  . cloNIDine  0.1 mg Oral BID  . docusate sodium  100 mg Oral BID  . feeding supplement  237 mL Oral BID BM  . feeding supplement (PRO-STAT SUGAR FREE 64)  30 mL Oral BID BM  . finasteride  5 mg Oral Daily  . FLUoxetine  20 mg Oral Daily  . guaiFENesin  1,200 mg Oral BID  . insulin aspart  0-9 Units Subcutaneous TID WC  . insulin aspart  2 Units Subcutaneous TID WC  . [START ON 11/18/2016] insulin glargine  15 Units Subcutaneous QHS  . irbesartan  75 mg Oral Daily  . LORazepam  0.5 mg Oral QID  . metoprolol succinate  75 mg Oral Daily  . pantoprazole  40 mg Oral Daily  . polyethylene glycol  17 g Oral Daily  . rosuvastatin  10 mg Oral QHS  . senna  1 tablet Oral Daily  . tamsulosin  0.4 mg Oral Daily   Continuous Infusions: . sodium chloride 50 mL/hr at 11/16/16 2100  . cefTRIAXone (ROCEPHIN)  IV Stopped (11/17/16 1825)     LOS: 3 days   Kerney Elbe, DO [facility] Pager [phone]  If 7PM-7AM, please contact night-coverage [email or web address] Password [email or web address] 11/17/2016, 10:02 PM

## 2016-11-18 ENCOUNTER — Other Ambulatory Visit: Payer: Self-pay

## 2016-11-18 LAB — CBC WITH DIFFERENTIAL/PLATELET
BASOS ABS: 0 10*3/uL (ref 0.0–0.1)
Basophils Relative: 0 %
EOS ABS: 0.4 10*3/uL (ref 0.0–0.7)
EOS PCT: 3 %
HCT: 27.8 % — ABNORMAL LOW (ref 39.0–52.0)
Hemoglobin: 9 g/dL — ABNORMAL LOW (ref 13.0–17.0)
LYMPHS PCT: 5 %
Lymphs Abs: 0.7 10*3/uL (ref 0.7–4.0)
MCH: 26.2 pg (ref 26.0–34.0)
MCHC: 32.4 g/dL (ref 30.0–36.0)
MCV: 81 fL (ref 78.0–100.0)
MONO ABS: 1 10*3/uL (ref 0.1–1.0)
Monocytes Relative: 7 %
Neutro Abs: 11.9 10*3/uL — ABNORMAL HIGH (ref 1.7–7.7)
Neutrophils Relative %: 85 %
PLATELETS: 257 10*3/uL (ref 150–400)
RBC: 3.43 MIL/uL — ABNORMAL LOW (ref 4.22–5.81)
RDW: 13.8 % (ref 11.5–15.5)
WBC: 14 10*3/uL — ABNORMAL HIGH (ref 4.0–10.5)

## 2016-11-18 LAB — MAGNESIUM: MAGNESIUM: 1.7 mg/dL (ref 1.7–2.4)

## 2016-11-18 LAB — CBC
HCT: 31.4 % — ABNORMAL LOW (ref 39.0–52.0)
Hemoglobin: 10.1 g/dL — ABNORMAL LOW (ref 13.0–17.0)
MCH: 25.8 pg — ABNORMAL LOW (ref 26.0–34.0)
MCHC: 32.2 g/dL (ref 30.0–36.0)
MCV: 80.1 fL (ref 78.0–100.0)
PLATELETS: 335 10*3/uL (ref 150–400)
RBC: 3.92 MIL/uL — ABNORMAL LOW (ref 4.22–5.81)
RDW: 13.4 % (ref 11.5–15.5)
WBC: 16.6 10*3/uL — AB (ref 4.0–10.5)

## 2016-11-18 LAB — COMPREHENSIVE METABOLIC PANEL
ALT: 16 U/L — ABNORMAL LOW (ref 17–63)
AST: 16 U/L (ref 15–41)
Albumin: 2.8 g/dL — ABNORMAL LOW (ref 3.5–5.0)
Alkaline Phosphatase: 95 U/L (ref 38–126)
Anion gap: 9 (ref 5–15)
BUN: 17 mg/dL (ref 6–20)
CHLORIDE: 101 mmol/L (ref 101–111)
CO2: 23 mmol/L (ref 22–32)
Calcium: 9.7 mg/dL (ref 8.9–10.3)
Creatinine, Ser: 1.07 mg/dL (ref 0.61–1.24)
Glucose, Bld: 294 mg/dL — ABNORMAL HIGH (ref 65–99)
POTASSIUM: 4 mmol/L (ref 3.5–5.1)
Sodium: 133 mmol/L — ABNORMAL LOW (ref 135–145)
Total Bilirubin: 0.5 mg/dL (ref 0.3–1.2)
Total Protein: 5.6 g/dL — ABNORMAL LOW (ref 6.5–8.1)

## 2016-11-18 LAB — CULTURE, URINE COMPREHENSIVE

## 2016-11-18 LAB — GLUCOSE, CAPILLARY
GLUCOSE-CAPILLARY: 287 mg/dL — AB (ref 65–99)
GLUCOSE-CAPILLARY: 248 mg/dL — AB (ref 65–99)
GLUCOSE-CAPILLARY: 209 mg/dL — AB (ref 65–99)
Glucose-Capillary: 222 mg/dL — ABNORMAL HIGH (ref 65–99)

## 2016-11-18 LAB — PHOSPHORUS: PHOSPHORUS: 3.1 mg/dL (ref 2.5–4.6)

## 2016-11-18 MED ORDER — LORAZEPAM 0.5 MG PO TABS: 0.5000 mg | ORAL_TABLET | Freq: Once | ORAL | Status: DC

## 2016-11-18 NOTE — Progress Notes (Signed)
Physical Therapy Treatment Patient Details Name: [name] MRN: [id number] DOB: 07-05-1931 Today's Date: 11/18/2016    History of Present Illness [name] is a 83 y.o. male with medical history significant for chronic A-fib on eliquis, uncontroled type 2 diabetes, CKD stage 3, below the knee amputation post limb infection, who presented to the ED at [facility] 11/13/16 sent from his orthopedic surgeon office. Patient went for his 1 month f/u appointment post BKA and while in the office attempted to pull his foley catheter out. Patient is in the room accompanied by friends who state that this behavior is out of his norm. found to have UTI    PT Comments    Today's skilled session focused on sit<>stand for strengthening. Pt very fearful during transfer, requiring max encouragement to move hands from arm rests to RW. Pt also educated on pressure relief techniques. Pt would benefit from continued skilled PT to increase functional independence and safety with mobility. Will continue to follow acutely.    Follow Up Recommendations  SNF     Equipment Recommendations  Other (comment)(TBD at SNF)    Recommendations for Other Services       Precautions / Restrictions Precautions Precautions: Fall Restrictions Weight Bearing Restrictions: No    Mobility  Bed Mobility               General bed mobility comments: At [facility] on arrival  Transfers Overall transfer level: Needs assistance Equipment used: Rolling walker (2 wheeled) Transfers: Sit to/from [facility] Sit to Stand: Mod assist;+2 physical assistance(x4) Stand pivot transfers: Mod assist;+2 physical assistance       General transfer comment: Mod A +2 for all transfers. Cues for hand placement. Pt performed sit<>stand 4x for LE and UE strengthening. Pt very fearful with each stand, requiring max encouragement to move hands from arm rests to RW.  Ambulation/Gait             General Gait  Details: not safe at this time.   Stairs            Wheelchair Mobility    Modified Rankin (Stroke Patients Only)       Balance Overall balance assessment: Needs assistance Sitting-balance support: Bilateral upper extremity supported;Feet supported Sitting balance-Leahy Scale: Fair   Postural control: Posterior lean Standing balance support: Bilateral upper extremity supported Standing balance-Leahy Scale: Poor Standing balance comment: Mod A to maintain balance in standing. Pt could only tolerate standing for ~ 5 seconds before requiring seated rest break.                            Cognition Arousal/Alertness: Awake/alert Behavior During Therapy: WFL for tasks assessed/performed Overall Cognitive Status: No family/caregiver present to determine baseline cognitive functioning                                 General Comments: Some cognitive deficits, no family available to determin baseline      Exercises      General Comments General comments (skin integrity, edema, etc.): educated on pressure relief techniques      Pertinent Vitals/Pain Pain Assessment: Faces Faces Pain Scale: Hurts even more Pain Location: Sacrum Pain Descriptors / Indicators: Discomfort;Grimacing    Home Living                      Prior Function  PT Goals (current goals can now be found in the care plan section) Acute Rehab PT Goals PT Goal Formulation: Patient unable to participate in goal setting Time For Goal Achievement: 11/28/16 Potential to Achieve Goals: Fair Progress towards PT goals: Progressing toward goals    Frequency    Min 2X/week      PT Plan Current plan remains appropriate    Co-evaluation              AM-PAC PT "6 Clicks" Daily Activity  Outcome Measure  Difficulty turning over in bed (including adjusting bedclothes, sheets and blankets)?: A Little Difficulty moving from lying on back to sitting on  the side of the bed? : A Little Difficulty sitting down on and standing up from a chair with arms (e.g., wheelchair, bedside commode, etc,.)?: Unable Help needed moving to and from a bed to chair (including a wheelchair)?: A Lot Help needed walking in hospital room?: A Lot Help needed climbing 3-5 steps with a railing? : Total 6 Click Score: 12    End of Session Equipment Utilized During Treatment: Gait belt Activity Tolerance: Patient tolerated treatment well;Patient limited by pain Patient left: in chair;with call bell/phone within reach;with chair alarm set Nurse Communication: Mobility status PT Visit Diagnosis: Unsteadiness on feet (R26.81);Other abnormalities of gait and mobility (R26.89);Muscle weakness (generalized) (M62.81)     Time: 2355-7322 PT Time Calculation (min) (ACUTE ONLY): 28 min  Charges:  $Therapeutic Exercise: 8-22 mins $Therapeutic Activity: 8-22 mins                    G Codes:       [name], [name] Pager [phone] Acute Rehab   Allena Katz 11/18/2016, 2:46 PM

## 2016-11-18 NOTE — Progress Notes (Signed)
PROGRESS NOTE    [name]  ASN:[id number] DOB: 01/27/1931 DOA: 11/13/2016 PCP: [name], MD   Brief Narrative: [name] is a 83 y.o. male with chronic A fib on Eliquis, DM, CKD stage 3, below knee amputation who presented to ED 11/9 from orthopedic surgery office after he pulled his foley cathter out and per family report he has had behavioral changes recently. On admission, UA showed leukocytes. CXR showed no acute infiltrates but repeat CXR showed bibasilar edema/infiltrates so IVF stopped and given 1 dose of IV Lasix.    Assessment & Plan:   Active Problems:   UTI (urinary tract infection)   UTI Urine culture significant for no growth. Patient with symptoms of dysuria, however. Complicated. -continue ceftriaxone  Leukocytosis Initially improved but has worsened. Appears the improvement may have been secondary to dilution. Afebrile. Source is possible urinary tract infection, however, urine culture negative. -pathology smear review -trend CBC  Acute kidney injury on CKD 3 Resolved. Back to baseline.  Abdominal pain Likely secondary to constipation. CT abdomen performed without etiology.   Diabetes mellitus, type 2 Diabetic nephropathy -Continue Lantus and SSI  Chronic atrial fibrillation Stable. -Continue Eliquis.  Hyponatremia Improved and stable.  Essential hypertension -Continue Irbesartan, clonidine, and tamsulosin  BPH Has a chronic indwelling foley -continue Finasteride and tamsulosin   DVT prophylaxis: Eliquis Code Status: Partial, DNI Family Communication: None at bedside Disposition Plan: Discharge to SNF when medically stable   Consultants:   None  Procedures:   None  Antimicrobials:  Ceftriaxone (11/9>>    Subjective: Dysuria  Objective: Vitals:   11/17/16 1459 11/17/16 2000 11/17/16 2029 11/18/16 0412  BP:  (!) 158/72  (!) 165/71  Pulse:  86 81 84  Resp:  17 18 18   Temp:  98 F (36.7 C)  97.9 F (36.6  C)  TempSrc:  Oral  Oral  SpO2: 96% 97% 96% 99%  Weight:      Height:        Intake/Output Summary (Last 24 hours) at 11/18/2016 1458 Last data filed at 11/18/2016 1100 Gross per 24 hour  Intake 780 ml  Output 5050 ml  Net -4270 ml   Filed Weights   11/13/16 2007  Weight: 80.1 kg (176 lb 9.4 oz)    Examination:  General exam: Appears calm and comfortable Respiratory system: Clear to auscultation. Respiratory effort normal. Cardiovascular system: S1 & S2 heard, RRR.[name] Gastrointestinal system: Abdomen is nondistended, soft and nontender. Normal bowel sounds heard. Central nervous system: Alert and oriented to person and place. Extremities: No edema. No calf tenderness Skin: No cyanosis. No rashes Psychiatry: Judgement and insight appear normal. Mood & affect appropriate.     Data Reviewed: I have personally reviewed following labs and imaging studies  CBC: Recent Labs  Lab 11/14/16 0438 11/15/16 0501 11/17/16 0445 11/18/16 0606 11/18/16 1348  WBC 16.1* 11.3* 12.4* 14.0* 16.6*  NEUTROABS  --   --  9.8* 11.9*  --   HGB 9.5* 9.4* 9.4* 9.0* 10.1*  HCT 30.1* 29.4* 30.1* 27.8* 31.4*  MCV 82.2 81.9 82.0 81.0 80.1  PLT 254 268 277 257 193   Basic Metabolic Panel: Recent Labs  Lab 11/13/16 1530 11/14/16 0438 11/15/16 0501 11/17/16 0445 11/18/16 0606  NA 131* 134* 131* 133* 133*  K 4.8 4.0 3.8 4.1 4.0  CL 97* 100* 99* 104 101  CO2 27 24 23 22 23   GLUCOSE 133* 166* 195* 228* 294*  BUN 41* 34* 23* 17 17  CREATININE  1.89* 1.60* 1.29* 1.18 1.07  CALCIUM 10.2 9.9 9.6 9.6 9.7  MG  --   --   --  1.6* 1.7  PHOS  --   --   --  3.1 3.1   GFR: Estimated Creatinine Clearance: 55.7 mL/min (by C-G formula based on SCr of 1.07 mg/dL). Liver Function Tests: Recent Labs  Lab 11/13/16 1530 11/17/16 0445 11/18/16 0606  AST 17 15 16   ALT 16* 16* 16*  ALKPHOS 104 101 95  BILITOT 0.8 0.4 0.5  PROT 6.2* 6.1* 5.6*  ALBUMIN 3.2* 2.9* 2.8*   No results for input(s):  LIPASE, AMYLASE in the last 168 hours. No results for input(s): AMMONIA in the last 168 hours. Coagulation Profile: No results for input(s): INR, PROTIME in the last 168 hours. Cardiac Enzymes: Recent Labs  Lab 11/13/16 1634  TROPONINI <0.03   BNP (last 3 results) No results for input(s): PROBNP in the last 8760 hours. HbA1C: No results for input(s): HGBA1C in the last 72 hours. CBG: Recent Labs  Lab 11/17/16 1212 11/17/16 1641 11/17/16 2129 11/18/16 0802 11/18/16 1205  GLUCAP 280* 244* 238* 287* 248*   Lipid Profile: No results for input(s): CHOL, HDL, LDLCALC, TRIG, CHOLHDL, LDLDIRECT in the last 72 hours. Thyroid Function Tests: No results for input(s): TSH, T4TOTAL, FREET4, T3FREE, THYROIDAB in the last 72 hours. Anemia Panel: No results for input(s): VITAMINB12, FOLATE, FERRITIN, TIBC, IRON, RETICCTPCT in the last 72 hours. Sepsis Labs: Recent Labs  Lab 11/13/16 1625  LATICACIDVEN 1.79    Recent Results (from the past 240 hour(s))  Microscopic Examination     Status: Abnormal   Collection Time: 11/12/16 11:01 AM  Result Value Ref Range Status   WBC, UA 11-30 (A) 0 - 5 /hpf Final   RBC, UA >30 (H) 0 - 2 /hpf Final   Epithelial Cells (non renal) None seen 0 - 10 /hpf Final   Mucus, UA Present (A) Not Estab. Final   Bacteria, UA Moderate (A) None seen/Few Final  CULTURE, URINE COMPREHENSIVE     Status: Abnormal   Collection Time: 11/12/16 11:12 AM  Result Value Ref Range Status   Urine Culture, Comprehensive Final report (A)  Final   Organism ID, Bacteria Comment (A)  Final    Comment: Enterobacter cloacae complex Greater than 100,000 colony forming units per mL    Organism ID, Bacteria Comment  Final    Comment: Mixed urogenital flora 10,000-25,000 colony forming units per mL    ANTIMICROBIAL SUSCEPTIBILITY Comment  Final    Comment:       ** S = Susceptible; I = Intermediate; R = Resistant **                    P = Positive; N = Negative              MICS are expressed in micrograms per mL    Antibiotic                 RSLT#1    RSLT#2    RSLT#3    RSLT#4 Amoxicillin/Clavulanic Acid    R Cefazolin                      R Cefepime                       S Cefuroxime  R Ciprofloxacin                  S Ertapenem                      S Gentamicin                     S Imipenem                       S Levofloxacin                   S Meropenem                      S Nitrofurantoin                 S Tetracycline                   S Tobramycin                     S Trimethoprim/Sulfa             S   Culture, Urine     Status: None   Collection Time: 11/14/16  6:15 PM  Result Value Ref Range Status   Specimen Description URINE, CATHETERIZED  Final   Special Requests NONE  Final   Culture NO GROWTH  Final   Report Status 11/15/2016 FINAL  Final         Radiology Studies: Dg Chest Port 1 View  Result Date: 11/17/2016 CLINICAL DATA:  Patient having episodes of SOB for 2-3 days with abdomen pain EXAM: PORTABLE CHEST - 1 VIEW COMPARISON:  11/13/2016 FINDINGS: New bibasilar interstitial edema or infiltrates. No confluent airspace disease. Stable borderline cardiomegaly.  CABG markers.  Atheromatous aorta. No effusion.  No pneumothorax. Previous median sternotomy . IMPRESSION: 1. New bibasilar interstitial edema/infiltrates. 2. Stable borderline cardiomegaly. Electronically Signed   By: Lucrezia Europe M.D.   On: 11/17/2016 11:24        Scheduled Meds: . amLODipine  10 mg Oral Daily  . antiseptic oral rinse  15 mL Mouth Rinse QID  . apixaban  5 mg Oral BID  . cloNIDine  0.1 mg Oral BID  . docusate sodium  100 mg Oral BID  . feeding supplement  237 mL Oral BID BM  . feeding supplement (PRO-STAT SUGAR FREE 64)  30 mL Oral BID BM  . finasteride  5 mg Oral Daily  . FLUoxetine  20 mg Oral Daily  . guaiFENesin  1,200 mg Oral BID  . insulin aspart  0-9 Units Subcutaneous TID WC  . insulin aspart  2 Units Subcutaneous  TID WC  . insulin glargine  15 Units Subcutaneous QHS  . irbesartan  75 mg Oral Daily  . LORazepam  0.5 mg Oral QID  . metoprolol succinate  75 mg Oral Daily  . pantoprazole  40 mg Oral Daily  . polyethylene glycol  17 g Oral Daily  . rosuvastatin  10 mg Oral QHS  . senna  1 tablet Oral Daily  . tamsulosin  0.4 mg Oral Daily   Continuous Infusions: . cefTRIAXone (ROCEPHIN)  IV Stopped (11/17/16 1825)     LOS: 4 days     Cordelia Poche, MD [facility] 11/18/2016, 2:58 PM Pager: [phone]  If 7PM-7AM, please contact night-coverage [email or web address] Password [email or web address] 11/18/2016, 2:58 PM

## 2016-11-18 NOTE — Care Management Important Message (Signed)
Important Message  Patient Details  Name: [name] MRN: [id number] Date of Birth: Feb 16, 1931   Medicare Important Message Given:  Yes    [name] [name] 11/18/2016, 12:47 PM

## 2016-11-19 ENCOUNTER — Ambulatory Visit: Payer: Medicare Other

## 2016-11-19 LAB — BASIC METABOLIC PANEL
ANION GAP: 6 (ref 5–15)
BUN: 16 mg/dL (ref 6–20)
CALCIUM: 10 mg/dL (ref 8.9–10.3)
CO2: 27 mmol/L (ref 22–32)
Chloride: 101 mmol/L (ref 101–111)
Creatinine, Ser: 1.11 mg/dL (ref 0.61–1.24)
GFR, EST NON AFRICAN AMERICAN: 59 mL/min — AB (ref >60)
Glucose, Bld: 201 mg/dL — ABNORMAL HIGH (ref 65–99)
Potassium: 4.1 mmol/L (ref 3.5–5.1)
Sodium: 134 mmol/L — ABNORMAL LOW (ref 135–145)

## 2016-11-19 LAB — CBC
HEMATOCRIT: 27.3 % — AB (ref 39.0–52.0)
Hemoglobin: 8.8 g/dL — ABNORMAL LOW (ref 13.0–17.0)
MCH: 26.1 pg (ref 26.0–34.0)
MCHC: 32.2 g/dL (ref 30.0–36.0)
MCV: 81 fL (ref 78.0–100.0)
PLATELETS: 277 10*3/uL (ref 150–400)
RBC: 3.37 MIL/uL — ABNORMAL LOW (ref 4.22–5.81)
RDW: 13.9 % (ref 11.5–15.5)
WBC: 11.9 10*3/uL — AB (ref 4.0–10.5)

## 2016-11-19 LAB — GLUCOSE, CAPILLARY
GLUCOSE-CAPILLARY: 144 mg/dL — AB (ref 65–99)
Glucose-Capillary: 222 mg/dL — ABNORMAL HIGH (ref 65–99)

## 2016-11-19 MED ORDER — POLYETHYLENE GLYCOL 3350 17 G PO PACK: 17.0000 g | PACK | Freq: Every day | ORAL | Status: DC

## 2016-11-19 MED ORDER — CEFPODOXIME PROXETIL 200 MG PO TABS: 200.0000 mg | ORAL_TABLET | Freq: Two times a day (BID) | ORAL | 0 refills | Status: AC

## 2016-11-19 MED ORDER — ONDANSETRON HCL 4 MG/2ML IJ SOLN
4.0000 mg | Freq: Four times a day (QID) | INTRAMUSCULAR | Status: DC | PRN
  Administered 2016-11-19: 4 mg via INTRAVENOUS
  Filled 2016-11-19: qty 2

## 2016-11-19 MED ORDER — FUROSEMIDE 40 MG PO TABS: 40.0000 mg | ORAL_TABLET | Freq: Every day | ORAL | Status: DC | PRN

## 2016-11-19 MED ORDER — OXYCODONE HCL 15 MG PO TABS: 15.0000 mg | ORAL_TABLET | Freq: Three times a day (TID) | ORAL | 0 refills | Status: DC | PRN

## 2016-11-19 MED ORDER — LORAZEPAM 0.5 MG PO TABS: 0.5000 mg | ORAL_TABLET | Freq: Four times a day (QID) | ORAL | 0 refills | Status: DC

## 2016-11-19 MED ORDER — ACETAMINOPHEN 325 MG PO TABS: 650.0000 mg | ORAL_TABLET | Freq: Four times a day (QID) | ORAL | Status: DC | PRN

## 2016-11-19 MED ORDER — SENNA 8.6 MG PO TABS: 1.0000 | ORAL_TABLET | Freq: Every day | ORAL | Status: DC

## 2016-11-19 MED ORDER — INSULIN GLARGINE 100 UNIT/ML ~~LOC~~ SOLN: 15.0000 [IU] | Freq: Every day | SUBCUTANEOUS | 11 refills | Status: DC

## 2016-11-19 NOTE — Progress Notes (Signed)
For discharged to [facility] in [place]. Maudie Mercury, nurse in-charged of patient was contacted and given report to. Discharged instructions and prescription given . Will be transported via ambulance. Social worker arranged transportation for 2pm. Daughter Velva Harman was contacted and made aware of the discharged. Latest VS BP-150/70,Temp-98 (Oral), P-84, O2 sat 97 on room air

## 2016-11-19 NOTE — Discharge Summary (Signed)
Physician Discharge Summary  Phillip BOCOCK ZSW:[id number] DOB: 01/29/1931 DOA: 11/13/2016  PCP: [name], MD  Admit date: 11/13/2016 Discharge date: 11/19/2016  Admitted From: SNF Disposition: SNF  Recommendations for Outpatient Follow-up:  1. Follow up with PCP in 1 week 2. Please obtain BMP/CBC in one week to recheck WBC 3. Please follow up on the following pending results: None  Home Health: SNF Equipment/Devices: SNF  Discharge Condition: Stable CODE STATUS: Partial code, DNI Diet recommendation: Heart healthy  Brief/Interim Summary:  Admission HPI written by [name], DO   Chief Complaint: Altered mental status. Sent from orthopedic surgery office due to pulling at his foley catheter.  HPI: [name] is a 83 y.o. 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 Patient went for his 1 month f/u appointment post BKA and while in the office attempted to pull his foley catheter out. Patient is in the room accompanied by friends who state that this behavior is out of his norm. At the time of this encounter, the patient was alert and oriented x 3 and responded to all questions appropriately. He admits to burning sensation when urine flows into the indwelling foley cath, states onset was prior to today. Denies nausea, flank pain, or abdominal pain.    ED Course: Meets SIRS criteria with HR 105, wbc 18k. U/A 11/12/16 +, repeated U/A in ED with urine culture in process. CXR negative for any focal infiltrates. Creatinine 1.89 baseline creatinine 1.34. IV fluid NS started in the ED. IV ceftriaxone day#0 (11/13/16).      Hospital course:  UTI Patient with symptoms of dysuria, however.  Symptoms improved prior to discharge.  He received 6 days of ceftriaxone. Urine culture  obtained on 11/10 (after initiation of antibiotics) is significant for no growth.  Per chart review, prior to admission, there is a urine culture significant for enterobacter cloacae. Sensitive to cefepime. Since responded to ceftriaxone, will discharge with four more days of Vantin. Full sensitivities below.  Metabolic encephalopathy Secondary to infection. Resolved with treatment.  Leukocytosis Presumed secondary to infection.  Trending down prior to discharge.  Will need repeat CBC to ensure resolution.  Acute kidney injury on CKD 3 Baseline of about 1.3-1.5.  1.89 on admission and improved to below baseline with IV fluids.  Abdominal pain Likely secondary to constipation. CT abdomen performed without etiology.   Diabetes mellitus, type 2 Diabetic nephropathy Continued Lantus and sliding scale coverage with meal coverage.  Chronic atrial fibrillation Stable. Continued Eliquis.  Hyponatremia Improved and stable.  Essential hypertension Continued Irbesartan, clonidine, and tamsulosin  BPH Has a chronic indwelling foley. Continued Finasteride and tamsulosin. Urology follow-up.    Discharge Diagnoses:  Active Problems:   UTI (urinary tract infection)    Discharge Instructions  Discharge Instructions    Call MD for:  difficulty breathing, headache or visual disturbances   Complete by:  As directed    Call MD for:  extreme fatigue   Complete by:  As directed    Call MD for:  persistant dizziness or light-headedness   Complete by:  As directed    Call MD for:  persistant nausea and vomiting   Complete by:  As directed    Call MD for:  temperature >100.4   Complete by:  As directed    Diet -  low sodium heart healthy   Complete by:  As directed    Increase activity slowly   Complete by:  As directed      Allergies as of 11/19/2016   No Known Allergies     Medication List    TAKE these medications   acetaminophen 325 MG tablet Commonly known as:   TYLENOL Take 650 mg every 6 (six) hours as needed by mouth (pain).   amLODipine 10 MG tablet Commonly known as:  NORVASC Take 1 tablet (10 mg total) by mouth daily.   antiseptic oral rinse Liqd 15 mLs by Mouth Rinse route 4 (four) times daily.   cefpodoxime 200 MG tablet Commonly known as:  VANTIN Take 1 tablet (200 mg total) 2 (two) times daily for 4 days by mouth.   cloNIDine 0.1 MG tablet Commonly known as:  CATAPRES Take 0.1 mg by mouth 2 (two) times daily.   docusate sodium 100 MG capsule Commonly known as:  COLACE Take 100 mg by mouth 2 (two) times daily.   ELIQUIS 5 MG Tabs tablet Generic drug:  apixaban Take 5 mg by mouth 2 (two) times daily.   feeding supplement (PRO-STAT SUGAR FREE 64) Liqd Take 30 mLs by mouth 2 (two) times daily between meals.   FLUoxetine 20 MG/5ML solution Commonly known as:  PROZAC Take 5 mLs (20 mg total) by mouth daily.   furosemide 40 MG tablet Commonly known as:  LASIX Take 1 tablet (40 mg total) daily as needed by mouth (weight gain of 3 or more pounds in 24-48 hour period).   GLUCERNA Liqd Take 237 mLs by mouth 2 (two) times daily between meals.   NUTRITIONAL SUPPLEMENT PO Take 120 mLs 2 (two) times daily by mouth. House supplement   insulin aspart 100 UNIT/ML injection Commonly known as:  novoLOG Inject 2 Units into the skin 3 (three) times daily with meals. Do not administer if he will not eat > 50 % of meal   insulin glargine 100 UNIT/ML injection Commonly known as:  LANTUS Inject 0.15 mLs (15 Units total) at bedtime into the skin. What changed:  how much to take   ipratropium-albuterol 0.5-2.5 (3) MG/3ML Soln Commonly known as:  DUONEB Take 3 mLs by nebulization every 6 (six) hours as needed.   irbesartan 75 MG tablet Commonly known as:  AVAPRO Take 1 tablet (75 mg total) by mouth daily.   Iron 325 (65 Fe) MG Tabs Give 1 tablet by mouth once a day   LORazepam 0.5 MG tablet Commonly known as:  ATIVAN Take 1  tablet (0.5 mg total) 4 (four) times daily by mouth.   metoprolol succinate 25 MG 24 hr tablet Commonly known as:  TOPROL-XL Take 3 tablets (75 mg total) by mouth daily. Take with or immediately following a meal.   nitroGLYCERIN 0.4 MG SL tablet Commonly known as:  NITROSTAT Place 1 tablet (0.4 mg total) under the tongue every 5 (five) minutes as needed for chest pain.   oxyCODONE 15 MG immediate release tablet Commonly known as:  ROXICODONE Take 1 tablet (15 mg total) every 8 (eight) hours as needed by mouth for pain.   pantoprazole 40 MG tablet Commonly known as:  PROTONIX Take 1 tablet (40 mg total) by mouth daily.   polyethylene glycol packet Commonly known as:  MIRALAX / GLYCOLAX Take 17 g daily by mouth. Start taking on:  11/20/2016   potassium chloride SA 20 MEQ tablet Commonly known as:  K-DUR,KLOR-CON Take 1 tablet (20  mEq total) by mouth daily as needed. Take when Lasix is needed. What changed:    reasons to take this  additional instructions   PROSCAR 5 MG tablet Generic drug:  finasteride Take 5 mg daily by mouth.   RISA-BID PROBIOTIC Tabs Take 1 tablet by mouth twice a day   rosuvastatin 10 MG tablet Commonly known as:  CRESTOR Take 10 mg by mouth at bedtime.   senna 8.6 MG Tabs tablet Commonly known as:  SENOKOT Take 1 tablet (8.6 mg total) daily by mouth. Start taking on:  11/20/2016   tamsulosin 0.4 MG Caps capsule Commonly known as:  FLOMAX Take 1 capsule (0.4 mg total) by mouth daily.      Contact information for after-discharge care    Destination    [facility] [facility] Follow up.   Service:  Skilled Nursing Contact information: [address] [place] [phone]             No Known Allergies  Consultations:  None   Procedures/Studies: Ct Abdomen Pelvis Wo Contrast  Result Date: 11/14/2016 CLINICAL DATA:  Hematuria EXAM: CT ABDOMEN AND PELVIS WITHOUT CONTRAST TECHNIQUE: Multidetector  CT imaging of the abdomen and pelvis was performed following the standard protocol without IV contrast. COMPARISON:  06/10/2016 FINDINGS: Lower chest: Dependent atelectasis. Hepatobiliary: Tiny layering gallstones. Liver is unremarkable. Stable. Pancreas: Within normal limits. Spleen: Unremarkable. Adrenals/Urinary Tract: Bilateral perinephric stranding is stable. No hydronephrosis. No urinary calculus. Adrenal glands are within normal limits. Foley catheter decompresses the bladder. Bladder is decompressed. Prostate is enlarged. 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 Duodenum is decompressed. There is no evidence of small-bowel obstruction. Normal appendix. No obvious mass in the colon. There is prominent stool burden in the rectum. Vascular/Lymphatic: Atherosclerotic calcifications of the aorta and iliac vasculature. No abnormal retroperitoneal adenopathy by measurement criteria. Reproductive: Prostate is enlarged. Other: No free-fluid. Musculoskeletal: Stable L1 compression fracture. IMPRESSION: Cholelithiasis. Prostate is enlarged. Foley catheter decompresses the bladder. No evidence of ureteral obstruction or ureteral calculus. Electronically Signed   By: [name] M.D.   On: 11/14/2016 14:06   Dg Chest 2 View  Result Date: 11/13/2016 CLINICAL DATA:  Chest pain. 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 The cardiac silhouette remains mildly enlarged. Aortic atherosclerosis is noted. There is minimal atelectasis scratched of there is minimal persistent atelectasis in the left lung base. The lungs are otherwise clear. No pleural effusion or pneumothorax is identified. No acute osseous abnormality is seen. IMPRESSION: No active cardiopulmonary disease. Electronically Signed   By: Logan Bores M.D.   On: 11/13/2016 17:16   Dg Chest Port 1 View  Result Date: 11/17/2016 CLINICAL DATA:  Patient having episodes of SOB for 2-3 days  with abdomen pain EXAM: PORTABLE CHEST - 1 VIEW COMPARISON:  11/13/2016 FINDINGS: New bibasilar interstitial edema or infiltrates. No confluent airspace disease. Stable borderline cardiomegaly.  CABG markers.  Atheromatous aorta. No effusion.  No pneumothorax. Previous median sternotomy . IMPRESSION: 1. New bibasilar interstitial edema/infiltrates. 2. Stable borderline cardiomegaly. Electronically Signed   By: Lucrezia Europe M.D.   On: 11/17/2016 11:24     Subjective: No dysuria. Wants to go home.  Discharge Exam: Vitals:   11/18/16 1957 11/19/16 0441  BP: (!) 149/74 (!) 145/72  Pulse: 84 82  Resp:    Temp: 98 F (36.7 C) 98.2 F (36.8 C)  SpO2: 97% 97%   Vitals:  11/18/16 0412 11/18/16 1503 11/18/16 1957 11/19/16 0441  BP: (!) 165/71 (!) 159/77 (!) 149/74 (!) 145/72  Pulse: 84 78 84 82  Resp: 18 18    Temp: 97.9 F (36.6 C) 98.1 F (36.7 C) 98 F (36.7 C) 98.2 F (36.8 C)  TempSrc: Oral Oral Oral Oral  SpO2: 99% 98% 97% 97%  Weight:      Height:        General: Pt is alert, awake, not in acute distress Cardiovascular: RRR, S1/S2 +, no rubs, no gallops Respiratory: CTA bilaterally, no wheezing, no rhonchi Abdominal: Soft, NT, ND, bowel sounds + Extremities: no edema, no cyanosis    The results of significant diagnostics from this hospitalization (including imaging, microbiology, ancillary and laboratory) are listed below for reference.     Microbiology: Recent Results (from the past 240 hour(s))  Microscopic Examination     Status: Abnormal   Collection Time: 11/12/16 11:01 AM  Result Value Ref Range Status   WBC, UA 11-30 (A) 0 - 5 /hpf Final   RBC, UA >30 (H) 0 - 2 /hpf Final   Epithelial Cells (non renal) None seen 0 - 10 /hpf Final   Mucus, UA Present (A) Not Estab. Final   Bacteria, UA Moderate (A) None seen/Few Final  CULTURE, URINE COMPREHENSIVE     Status: Abnormal   Collection Time: 11/12/16 11:12 AM  Result Value Ref Range Status   Urine Culture,  Comprehensive Final report (A)  Final   Organism ID, Bacteria Comment (A)  Final    Comment: Enterobacter cloacae complex Greater than 100,000 colony forming units per mL    Organism ID, Bacteria Comment  Final    Comment: Mixed urogenital flora 10,000-25,000 colony forming units per mL    ANTIMICROBIAL SUSCEPTIBILITY Comment  Final    Comment:       ** S = Susceptible; I = Intermediate; R = Resistant **                    P = Positive; N = Negative             MICS are expressed in micrograms per mL    Antibiotic                 RSLT#1    RSLT#2    RSLT#3    RSLT#4 Amoxicillin/Clavulanic Acid    R Cefazolin                      R Cefepime                       S Cefuroxime                     R Ciprofloxacin                  S Ertapenem                      S Gentamicin                     S Imipenem                       S Levofloxacin                   S Meropenem  S Nitrofurantoin                 S Tetracycline                   S Tobramycin                     S Trimethoprim/Sulfa             S   Culture, Urine     Status: None   Collection Time: 11/14/16  6:15 PM  Result Value Ref Range Status   Specimen Description URINE, CATHETERIZED  Final   Special Requests NONE  Final   Culture NO GROWTH  Final   Report Status 11/15/2016 FINAL  Final     Labs: BNP (last 3 results) Recent Labs    09/09/16 2115 09/24/16 0021 10/20/16 0006  BNP 404.0* 476.0* 915.0*   Basic Metabolic Panel: Recent Labs  Lab 11/14/16 0438 11/15/16 0501 11/17/16 0445 11/18/16 0606 11/19/16 0557  NA 134* 131* 133* 133* 134*  K 4.0 3.8 4.1 4.0 4.1  CL 100* 99* 104 101 101  CO2 24 23 22 23 27   GLUCOSE 166* 195* 228* 294* 201*  BUN 34* 23* 17 17 16   CREATININE 1.60* 1.29* 1.18 1.07 1.11  CALCIUM 9.9 9.6 9.6 9.7 10.0  MG  --   --  1.6* 1.7  --   PHOS  --   --  3.1 3.1  --    Liver Function Tests: Recent Labs  Lab 11/13/16 1530 11/17/16 0445 11/18/16 0606  AST  17 15 16   ALT 16* 16* 16*  ALKPHOS 104 101 95  BILITOT 0.8 0.4 0.5  PROT 6.2* 6.1* 5.6*  ALBUMIN 3.2* 2.9* 2.8*   No results for input(s): LIPASE, AMYLASE in the last 168 hours. No results for input(s): AMMONIA in the last 168 hours. CBC: Recent Labs  Lab 11/15/16 0501 11/17/16 0445 11/18/16 0606 11/18/16 1348 11/19/16 0557  WBC 11.3* 12.4* 14.0* 16.6* 11.9*  NEUTROABS  --  9.8* 11.9*  --   --   HGB 9.4* 9.4* 9.0* 10.1* 8.8*  HCT 29.4* 30.1* 27.8* 31.4* 27.3*  MCV 81.9 82.0 81.0 80.1 81.0  PLT 268 277 257 335 277   Cardiac Enzymes: Recent Labs  Lab 11/13/16 1634  TROPONINI <0.03   BNP: Invalid input(s): POCBNP CBG: Recent Labs  Lab 11/18/16 0802 11/18/16 1205 11/18/16 1810 11/18/16 2213 11/19/16 0745  GLUCAP 287* 248* 222* 209* 222*   D-Dimer No results for input(s): DDIMER in the last 72 hours. Hgb A1c No results for input(s): HGBA1C in the last 72 hours. Lipid Profile No results for input(s): CHOL, HDL, LDLCALC, TRIG, CHOLHDL, LDLDIRECT in the last 72 hours. Thyroid function studies No results for input(s): TSH, T4TOTAL, T3FREE, THYROIDAB in the last 72 hours.  Invalid input(s): FREET3 Anemia work up No results for input(s): VITAMINB12, FOLATE, FERRITIN, TIBC, IRON, RETICCTPCT in the last 72 hours. Urinalysis    Component Value Date/Time   COLORURINE RED (A) 11/13/2016 1852   APPEARANCEUR CLOUDY (A) 11/13/2016 1852   APPEARANCEUR Cloudy (A) 11/12/2016 1101   LABSPEC 1.005 11/13/2016 1852   PHURINE 6.0 11/13/2016 1852   GLUCOSEU NEGATIVE 11/13/2016 1852   HGBUR LARGE (A) 11/13/2016 1852   BILIRUBINUR NEGATIVE 11/13/2016 1852   BILIRUBINUR Negative 11/12/2016 [place] 11/13/2016 1852   PROTEINUR 100 (A) 11/13/2016 1852   UROBILINOGEN 0.2 12/19/2013 1905   NITRITE NEGATIVE 11/13/2016 1852  LEUKOCYTESUR SMALL (A) 11/13/2016 1852   LEUKOCYTESUR 2+ (A) 11/12/2016 1101   Sepsis Labs Invalid input(s): PROCALCITONIN,  WBC,   LACTICIDVEN Microbiology Recent Results (from the past 240 hour(s))  Microscopic Examination     Status: Abnormal   Collection Time: 11/12/16 11:01 AM  Result Value Ref Range Status   WBC, UA 11-30 (A) 0 - 5 /hpf Final   RBC, UA >30 (H) 0 - 2 /hpf Final   Epithelial Cells (non renal) None seen 0 - 10 /hpf Final   Mucus, UA Present (A) Not Estab. Final   Bacteria, UA Moderate (A) None seen/Few Final  CULTURE, URINE COMPREHENSIVE     Status: Abnormal   Collection Time: 11/12/16 11:12 AM  Result Value Ref Range Status   Urine Culture, Comprehensive Final report (A)  Final   Organism ID, Bacteria Comment (A)  Final    Comment: Enterobacter cloacae complex Greater than 100,000 colony forming units per mL    Organism ID, Bacteria Comment  Final    Comment: Mixed urogenital flora 10,000-25,000 colony forming units per mL    ANTIMICROBIAL SUSCEPTIBILITY Comment  Final    Comment:       ** S = Susceptible; I = Intermediate; R = Resistant **                    P = Positive; N = Negative             MICS are expressed in micrograms per mL    Antibiotic                 RSLT#1    RSLT#2    RSLT#3    RSLT#4 Amoxicillin/Clavulanic Acid    R Cefazolin                      R Cefepime                       S Cefuroxime                     R Ciprofloxacin                  S Ertapenem                      S Gentamicin                     S Imipenem                       S Levofloxacin                   S Meropenem                      S Nitrofurantoin                 S Tetracycline                   S Tobramycin                     S Trimethoprim/Sulfa             S   Culture, Urine     Status: None   Collection Time: 11/14/16  6:15 PM  Result Value Ref Range Status   Specimen Description  URINE, CATHETERIZED  Final   Special Requests NONE  Final   Culture NO GROWTH  Final   Report Status 11/15/2016 FINAL  Final     Time coordinating discharge: Over 30 minutes  SIGNED:   Cordelia Poche, MD [facility] 11/19/2016, 11:16 AM Pager [phone]  If 7PM-7AM, please contact night-coverage [email or web address] Password [email or web address]

## 2016-11-19 NOTE — Progress Notes (Addendum)
Discharge to: [name] Anticipated discharge date: 11/19/16 Family notified: Yes, by phone Transportation by: Corey Harold; per daughter, patient will be a FULL CODE for transport  Report #: [id number], Room A-1  [place] signing off.  Laveda Abbe LCSW [phone]

## 2016-11-23 ENCOUNTER — Encounter (INDEPENDENT_AMBULATORY_CARE_PROVIDER_SITE_OTHER): Payer: Self-pay | Admitting: Orthopaedic Surgery

## 2016-12-03 ENCOUNTER — Telehealth: Payer: Self-pay | Admitting: Urology

## 2016-12-03 NOTE — Telephone Encounter (Signed)
Pt's facility, [facility] [place], [phone] ext [phone].  Pt's family wants to know if he can come in sooner.  Can he come in for a nurse visit?  Please advise and give her a call.

## 2016-12-07 NOTE — Telephone Encounter (Signed)
Sarah took care of this.

## 2016-12-08 ENCOUNTER — Ambulatory Visit (INDEPENDENT_AMBULATORY_CARE_PROVIDER_SITE_OTHER): Payer: Medicare Other

## 2016-12-08 ENCOUNTER — Ambulatory Visit (INDEPENDENT_AMBULATORY_CARE_PROVIDER_SITE_OTHER): Payer: Medicare Other | Admitting: Orthopaedic Surgery

## 2016-12-08 ENCOUNTER — Encounter (INDEPENDENT_AMBULATORY_CARE_PROVIDER_SITE_OTHER): Payer: Self-pay | Admitting: Orthopaedic Surgery

## 2016-12-08 VITALS — BP 145/71 | HR 75 | Ht 68.0 in | Wt 170.0 lb

## 2016-12-08 DIAGNOSIS — R339 Retention of urine, unspecified: Secondary | ICD-10-CM | POA: Diagnosis not present

## 2016-12-08 DIAGNOSIS — Z89512 Acquired absence of left leg below knee: Secondary | ICD-10-CM

## 2016-12-08 DIAGNOSIS — IMO0002 Reserved for concepts with insufficient information to code with codable children: Secondary | ICD-10-CM

## 2016-12-08 NOTE — Progress Notes (Signed)
Office Visit Note   Patient: [name]           Date of Birth: July 03, 1931           MRN: [id number] Visit Date: 12/08/2016              Requested by: [name], MD [address] [place], [place] [address] PCP: [name], MD   Assessment & Plan: Visit Diagnoses:  1. Below knee amputation status, left (HCC)     Plan: Stump shrinker sock ordered.  Sutures are removed today.  Prescription for stump shrinker sock and then prosthetic fitting as soon as edema decreases.  Follow-Up Instructions: Return in about 1 month (around 01/08/2017).   Orders:  No orders of the defined types were placed in this encounter.  No orders of the defined types were placed in this encounter.     Procedures: No procedures performed   Clinical Data: No additional findings.   Subjective: Chief Complaint  Patient presents with  . Left Knee - Routine Post Op    HPI patient returns post BKA for osteomyelitis with diabetes.  He had urosepsis when he presented to the office last week and was trying to jerk out his Foley and was admitted treated with antibiotics and now is doing much better.  He is doing some standing with therapy.  Stump is nicely healed sutures are removed today and we will refer him for prosthetic stump shrinker sock and then prosthetic fitting.   Review of Systems updated unchanged from last office visit other than the admission for urosepsis that responded to antibiotics.  He had altered mental status with the urosepsis.   Objective: Vital Signs: There were no vitals taken for this visit.  Physical Exam stump is well-healed sutures are ready to be removed no drainage.  No cellulitis or strays pitting edema over the distal tibia.  Ortho Exam  Specialty Comments:  No specialty comments available.  Imaging: No results found.   PMFS History: Patient Active Problem List   Diagnosis Date Noted  . UTI (urinary tract infection) 11/13/2016  .  Osteomyelitis of left foot ([place]) 10/06/2016  . Acute osteomyelitis of left foot ([place]) 10/06/2016  . CHF exacerbation ([place]) 09/24/2016  . Cellulitis and abscess of foot   . HCAP (healthcare-associated pneumonia) 09/09/2016  . COPD (chronic obstructive pulmonary disease) ([place]) 09/09/2016  . CAD (coronary artery disease) 09/09/2016  . Hyperlipidemia 09/09/2016  . Type 2 diabetes mellitus ([place]) 07/24/2016  . Pressure injury of skin 06/11/2016  . Atrial fibrillation with normal ventricular rate ([place]) 06/10/2016  . Esophageal stricture 06/09/2016  . CAD in native artery 06/08/2016  . Acute renal failure superimposed on stage 3 chronic kidney disease ([place]) 06/08/2016  . Chronic diastolic CHF (congestive heart failure) ([place]) 03/19/2016  . CAP (community acquired pneumonia) 03/19/2016  . Abnormal weight loss   . Thrush, oral 12/18/2013  . Chest pain at rest 12/18/2013  . Leukocytosis 12/18/2013  . Diabetes ([place]) 12/18/2013  . Chronic back pain 12/18/2013  . Dysphagia 12/18/2013  . Odynophagia 12/18/2013  . Gastric mass 09/07/2013  . Personal history of colonic polyps 08/05/2013  . Nausea and vomiting 08/01/2013  . UTI (lower urinary tract infection) 12/09/2012  . Lower extremity edema 06/02/2012  . Essential hypertension 06/02/2012  . History of DVT (deep vein thrombosis) 06/02/2012  . Obesity (BMI 30-39.9): BMI 31.3 06/02/2012   Past Medical History:  Diagnosis Date  . BPH (benign prostatic hyperplasia)   . CAD (  coronary artery disease)    Multivessel status post CABG 2011 - LIMA to LAD, SVG to diagonal, SVG to OM, SVG to PDA  . Cellulitis    12/15  . Chronic back pain   . Chronic diastolic CHF (congestive heart failure) ([place])   . CKD (chronic kidney disease), stage III ([place])   . COPD (chronic obstructive pulmonary disease) ([place])   . Essential hypertension   . Gastric mass    EGD 9/15  . GERD (gastroesophageal reflux disease)   . History of DVT (deep vein thrombosis)     Postphlebitic syndrome  . History of kidney stones   . HOH (hard of hearing)   . Hx of CABG   . Hyperlipidemia   . Persistent atrial fibrillation ([place])    a. s/p DCCV 03/2016.  [name] Sleep apnea    Stop Bang score of 5  . Type 2 diabetes mellitus ([place])   . UTI (urinary tract infection) 11/2016    Family History  Problem Relation Age of Onset  . Colon cancer Son 28       deceased    Past Surgical History:  Procedure Laterality Date  . AMPUTATION Left 10/07/2016   Procedure: AMPUTATION BELOW KNEE;  Surgeon: [name], MD;  Location: [facility];  Service: Orthopedics;  Laterality: Left;  . CARDIOVERSION N/A 03/20/2016   Procedure: CARDIOVERSION;  Surgeon: Arnoldo Lenis, MD;  [facility];  Service: Endoscopy;  Laterality: N/A;  . CATARACT EXTRACTION W/PHACO Left 02/07/2016   Procedure: CATARACT EXTRACTION PHACO AND INTRAOCULAR LENS PLACEMENT (IOC);  Surgeon: [name], MD;  [facility];  Service: Ophthalmology;  Laterality: Left;  CDE:  23.13  . COLONOSCOPY  2004   Dr. Laural Golden: three small polyps at cecum, path unknown, external hemorrhoids  . COLONOSCOPY N/A 08/16/2013   Dr. [name]: incomplete prep. multiple tubular adenomas, multiple biopsies. Needs surveillance in Aug 2016 due to poor prep  . CORONARY ARTERY BYPASS GRAFT     x5  . CORONARY ARTERY BYPASS GRAFT  2011  . CYSTOSCOPY N/A 12/12/2012   Procedure: CYSTOSCOPY FLEXIBLE;  Surgeon: Marissa Nestle, MD;  [facility];  Service: Urology;  Laterality: N/A;  . ESOPHAGEAL DILATION N/A 09/14/2016   Procedure: ESOPHAGEAL DILATION;  Surgeon: Daneil Dolin, MD;  [facility];  Service: Endoscopy;  Laterality: N/A;  . ESOPHAGOGASTRODUODENOSCOPY N/A 08/16/2013   Dr. [name]: normal esophagus s/p Maloney dilation, gastric erosions, submucosal gastric mass vs extrinsic mass  . ESOPHAGOGASTRODUODENOSCOPY (EGD) WITH PROPOFOL N/A 09/14/2016   Procedure: ESOPHAGOGASTRODUODENOSCOPY (EGD) WITH PROPOFOL;  Surgeon:  Daneil Dolin, MD;  [facility];  Service: Endoscopy;  Laterality: N/A;  . EUS N/A 09/07/2013   Dr. 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 INCISION AND DRAINAGE ABSCESS N/A 12/20/2013   Procedure: INCISION AND DRAINAGE ABSCESS NECK;  Surgeon: Jamesetta So, MD;  [facility];  Service: General;  Laterality: N/A;  . INCISION AND DRAINAGE ABSCESS Left 09/04/2016   Procedure: INCISION AND DRAINAGE ABSCESS;  Surgeon: Aviva Signs, MD;  [facility];  Service: General;  Laterality: Left;  [name] MALONEY DILATION N/A 08/16/2013   Procedure: Venia Minks DILATION;  Surgeon: Daneil Dolin, MD;  [facility];  Service: Endoscopy;  Laterality: N/A;   Social History   Occupational History  . Occupation: Retired  Tobacco Use  . Smoking status: Former Smoker    Packs/day: 1.00    Years: 11.00  Pack years: 11.00    Last attempt to quit: 01/05/1956    Years since quitting: 60.9  . Smokeless tobacco: Never Used  Substance and Sexual Activity  . Alcohol use: No  . Drug use: No  . Sexual activity: No

## 2016-12-08 NOTE — Progress Notes (Signed)
Pt presents today for voiding trial. Pt recently had a BKA and was unable to transfer for fill and pull. Spoke with Maudie Mercury, LPN at pt facility who stated facility would be able to replace foley if needed. Per Dr. Erlene Quan foley was removed, orders given to replace PRN, and pt needs a 1 week f/u. Made Kim and pt daughter aware of orders. Maudie Mercury and daughter voiced understanding. Pt made f/u appt at check out.  32fr foley removed. Pt tolerated well. No s/s of adverse reaction noted.  Blood pressure (!) 145/71, pulse 75, height 5\' 8"  (1.727 m), weight 170 lb (77.1 kg).

## 2016-12-11 ENCOUNTER — Ambulatory Visit: Payer: Medicare Other | Admitting: Urology

## 2016-12-15 ENCOUNTER — Ambulatory Visit (INDEPENDENT_AMBULATORY_CARE_PROVIDER_SITE_OTHER): Payer: Medicare Other | Admitting: Urology

## 2016-12-15 ENCOUNTER — Encounter: Payer: Self-pay | Admitting: Urology

## 2016-12-15 VITALS — BP 110/62 | HR 79

## 2016-12-15 DIAGNOSIS — N401 Enlarged prostate with lower urinary tract symptoms: Secondary | ICD-10-CM | POA: Diagnosis not present

## 2016-12-15 DIAGNOSIS — N138 Other obstructive and reflux uropathy: Secondary | ICD-10-CM

## 2016-12-15 DIAGNOSIS — R339 Retention of urine, unspecified: Secondary | ICD-10-CM

## 2016-12-15 NOTE — Progress Notes (Signed)
12/15/2016 4:07 PM   [name] January 13, 1931 [id number]  Referring provider: [name], MD [address] [place],  [address]  Chief Complaint  Patient presents with  . Urinary Retention    HPI: 83 year old male with a history of BPH/urinary retention he returns today after failed voiding trial last week.  He was initially seen and evaluated 11/2016 after developing urinary retention after hospitalization/rehab admission.  At that time of initial urologic evaluation, the Foley catheter had only been 3 days and additional time was allowed.  He was subsequently readmitted with altered mental status, presumed UTI (Enterobacter) and catheter trauma from pulling his Foley.    By the time of discharge, his creatinine had improved to 1.1.  CT abdomen pelvis without contrast 11/14/2016 shows, otherwise is unremarkable for any GU pathology.  More recently, he underwent a voiding trial in our office.  The catheter was removed.  He was unable to void and catheter was replaced the subsequent day.  Per report, the patient was having difficulty emptying his bladder prior to developing retention.  He was having dribbles when he tried to void.  He continues to be somewhat of a poor historian.  He is accompanied today by his friend.  He has multiple medical comorbidities including CHF, COPD, diabetes, coronary artery disease, amongst others.   PMH: Past Medical History:  Diagnosis Date  . BPH (benign prostatic hyperplasia)   . CAD (coronary artery disease)    Multivessel status post CABG 2011 - LIMA to LAD, SVG to diagonal, SVG to OM, SVG to PDA  . Cellulitis    12/15  . Chronic back pain   . Chronic diastolic CHF (congestive heart failure) ([place])   . CKD (chronic kidney disease), stage III ([place])   . COPD (chronic obstructive pulmonary disease) ([place])   . Essential hypertension   . Gastric mass    EGD 9/15  . GERD (gastroesophageal reflux disease)   . History of DVT  (deep vein thrombosis)    Postphlebitic syndrome  . History of kidney stones   . HOH (hard of hearing)   . Hx of CABG   . Hyperlipidemia   . Persistent atrial fibrillation ([place])    a. s/p DCCV 03/2016.  [name] Sleep apnea    Stop Bang score of 5  . Type 2 diabetes mellitus ([place])   . UTI (urinary tract infection) 11/2016    Surgical History: Past Surgical History:  Procedure Laterality Date  . AMPUTATION Left 10/07/2016   Procedure: AMPUTATION BELOW KNEE;  Surgeon: [name], MD;  Location: [facility];  Service: Orthopedics;  Laterality: Left;  . CARDIOVERSION N/A 03/20/2016   Procedure: CARDIOVERSION;  Surgeon: Arnoldo Lenis, MD;  [facility];  Service: Endoscopy;  Laterality: N/A;  . CATARACT EXTRACTION W/PHACO Left 02/07/2016   Procedure: CATARACT EXTRACTION PHACO AND INTRAOCULAR LENS PLACEMENT (IOC);  Surgeon: [name], MD;  [facility];  Service: Ophthalmology;  Laterality: Left;  CDE:  23.13  . COLONOSCOPY  2004   Dr. Laural Golden: three small polyps at cecum, path unknown, external hemorrhoids  . COLONOSCOPY N/A 08/16/2013   Dr. [name]: incomplete prep. multiple tubular adenomas, multiple biopsies. Needs surveillance in Aug 2016 due to poor prep  . CORONARY ARTERY BYPASS GRAFT     x5  . CORONARY ARTERY BYPASS GRAFT  2011  . CYSTOSCOPY N/A 12/12/2012   Procedure: CYSTOSCOPY FLEXIBLE;  Surgeon: Marissa Nestle, MD;  [facility];  Service: Urology;  Laterality:  N/A;  . ESOPHAGEAL DILATION N/A 09/14/2016   Procedure: ESOPHAGEAL DILATION;  Surgeon: Daneil Dolin, MD;  [facility];  Service: Endoscopy;  Laterality: N/A;  . ESOPHAGOGASTRODUODENOSCOPY N/A 08/16/2013   Dr. [name]: normal esophagus s/p Maloney dilation, gastric erosions, submucosal gastric mass vs extrinsic mass  . ESOPHAGOGASTRODUODENOSCOPY (EGD) WITH PROPOFOL N/A 09/14/2016   Procedure: ESOPHAGOGASTRODUODENOSCOPY (EGD) WITH PROPOFOL;  Surgeon: Daneil Dolin, MD;  [facility];   Service: Endoscopy;  Laterality: N/A;  . EUS N/A 09/07/2013   Dr. 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 INCISION AND DRAINAGE ABSCESS N/A 12/20/2013   Procedure: INCISION AND DRAINAGE ABSCESS NECK;  Surgeon: Jamesetta So, MD;  [facility];  Service: General;  Laterality: N/A;  . INCISION AND DRAINAGE ABSCESS Left 09/04/2016   Procedure: INCISION AND DRAINAGE ABSCESS;  Surgeon: Aviva Signs, MD;  [facility];  Service: General;  Laterality: Left;  [name] MALONEY DILATION N/A 08/16/2013   Procedure: Venia Minks DILATION;  Surgeon: Daneil Dolin, MD;  [facility];  Service: Endoscopy;  Laterality: N/A;    Home Medications:  Allergies as of 12/15/2016   No Known Allergies     Medication List        Accurate as of 12/15/16 11:59 PM. Always use your most recent med list.          acetaminophen 325 MG tablet Commonly known as:  TYLENOL Take 650 mg every 6 (six) hours as needed by mouth (pain).   amLODipine 10 MG tablet Commonly known as:  NORVASC Take 1 tablet (10 mg total) by mouth daily.   antiseptic oral rinse Liqd 15 mLs by Mouth Rinse route 4 (four) times daily.   cloNIDine 0.1 MG tablet Commonly known as:  CATAPRES Take 0.1 mg by mouth 2 (two) times daily.   docusate sodium 100 MG capsule Commonly known as:  COLACE Take 100 mg by mouth 2 (two) times daily.   ELIQUIS 5 MG Tabs tablet Generic drug:  apixaban Take 5 mg by mouth 2 (two) times daily.   feeding supplement (PRO-STAT SUGAR FREE 64) Liqd Take 30 mLs by mouth 2 (two) times daily between meals.   FLUoxetine 20 MG/5ML solution Commonly known as:  PROZAC Take 5 mLs (20 mg total) by mouth daily.   furosemide 40 MG tablet Commonly known as:  LASIX Take 1 tablet (40 mg total) daily as needed by mouth (weight gain of 3 or more pounds in 24-48 hour period).   GLUCERNA Liqd Take 237 mLs by mouth 2 (two) times daily between meals.   NUTRITIONAL SUPPLEMENT PO Take 120 mLs  2 (two) times daily by mouth. House supplement   insulin aspart 100 UNIT/ML injection Commonly known as:  novoLOG Inject 2 Units into the skin 3 (three) times daily with meals. Do not administer if he will not eat > 50 % of meal   insulin glargine 100 UNIT/ML injection Commonly known as:  LANTUS Inject 0.15 mLs (15 Units total) at bedtime into the skin.   ipratropium-albuterol 0.5-2.5 (3) MG/3ML Soln Commonly known as:  DUONEB Take 3 mLs by nebulization every 6 (six) hours as needed.   irbesartan 75 MG tablet Commonly known as:  AVAPRO Take 1 tablet (75 mg total) by mouth daily.   Iron 325 (65 Fe) MG Tabs Give 1 tablet by mouth once a day   LORazepam 0.5 MG tablet Commonly known as:  ATIVAN Take 1 tablet (0.5 mg  total) 4 (four) times daily by mouth.   metoprolol succinate 25 MG 24 hr tablet Commonly known as:  TOPROL-XL Take 3 tablets (75 mg total) by mouth daily. Take with or immediately following a meal.   nitroGLYCERIN 0.4 MG SL tablet Commonly known as:  NITROSTAT Place 1 tablet (0.4 mg total) under the tongue every 5 (five) minutes as needed for chest pain.   oxyCODONE 15 MG immediate release tablet Commonly known as:  ROXICODONE Take 1 tablet (15 mg total) every 8 (eight) hours as needed by mouth for pain.   pantoprazole 40 MG tablet Commonly known as:  PROTONIX Take 1 tablet (40 mg total) by mouth daily.   polyethylene glycol packet Commonly known as:  MIRALAX / GLYCOLAX Take 17 g daily by mouth.   potassium chloride SA 20 MEQ tablet Commonly known as:  K-DUR,KLOR-CON Take 1 tablet (20 mEq total) by mouth daily as needed. Take when Lasix is needed.   PROSCAR 5 MG tablet Generic drug:  finasteride Take 5 mg daily by mouth.   RISA-BID PROBIOTIC Tabs Take 1 tablet by mouth twice a day   rosuvastatin 10 MG tablet Commonly known as:  CRESTOR Take 10 mg by mouth at bedtime.   senna 8.6 MG Tabs tablet Commonly known as:  SENOKOT Take 1 tablet (8.6 mg  total) daily by mouth.   tamsulosin 0.4 MG Caps capsule Commonly known as:  FLOMAX Take 1 capsule (0.4 mg total) by mouth daily.       Allergies: No Known Allergies  Family History: Family History  Problem Relation Age of Onset  . Colon cancer Son 20       deceased    Social History:  reports that he quit smoking about 60 years ago. He has a 11.00 pack-year smoking history. he has never used smokeless tobacco. He reports that he does not drink alcohol or use drugs.  ROS: UROLOGY Frequent Urination?: No Hard to postpone urination?: No Burning/pain with urination?: No Get up at night to urinate?: No Leakage of urine?: No Urine stream starts and stops?: No Trouble starting stream?: No Do you have to strain to urinate?: No Blood in urine?: No Urinary tract infection?: No Sexually transmitted disease?: No Injury to kidneys or bladder?: No Painful intercourse?: No Weak stream?: No Erection problems?: No Penile pain?: Yes  Gastrointestinal Nausea?: Yes Vomiting?: No Indigestion/heartburn?: No Diarrhea?: No Constipation?: Yes  Constitutional Fever: No Night sweats?: No Weight loss?: No Fatigue?: Yes  Skin Skin rash/lesions?: No Itching?: Yes  Eyes Blurred vision?: No Double vision?: No  Ears/Nose/Throat Sore throat?: No Sinus problems?: No  Hematologic/Lymphatic Swollen glands?: No Easy bruising?: No  Cardiovascular Leg swelling?: No Chest pain?: No  Respiratory Cough?: No Shortness of breath?: No  Endocrine Excessive thirst?: Yes  Musculoskeletal Back pain?: No Joint pain?: No  Neurological Headaches?: No Dizziness?: No  Psychologic Depression?: No Anxiety?: Yes  Physical Exam: BP 110/62   Pulse 79   Constitutional: Alert only to self.  He is unsure of the place or date, believes since 66.  Accompanied by a friend again today.  Difficult historian. HEENT: [place] AT, moist mucus membranes.  Trachea midline, no  masses. Cardiovascular: No clubbing, cyanosis, or edema. Respiratory: Normal respiratory effort, no increased work of breathing. GI: Abdomen is soft, nontender, nondistended, no abdominal masses GU: Foley catheter in place draining clear yellow urine MSK: Left leg surgically absent Skin: No rashes, bruises or suspicious lesions. Neurologic: Grossly intact, no focal deficits, moving all 4  extremities. Psychiatric: Normal mood and affect.  Laboratory Data: Lab Results  Component Value Date   WBC 11.9 (H) 11/19/2016   HGB 8.8 (L) 11/19/2016   HCT 27.3 (L) 11/19/2016   MCV 81.0 11/19/2016   PLT 277 11/19/2016    Lab Results  Component Value Date   CREATININE 1.11 11/19/2016    Lab Results  Component Value Date   PSA1 1.5 12/15/2016    Lab Results  Component Value Date   HGBA1C 8.0 (H) 11/14/2016    Urinalysis Lab Results  Component Value Date   SPECGRAV 1.010 11/12/2016   PHUR 5.5 11/12/2016   COLORU Pink (A) 11/12/2016   APPEARANCEUR CLOUDY (A) 11/13/2016   LEUKOCYTESUR SMALL (A) 11/13/2016   PROTEINUR 100 (A) 11/13/2016   GLUCOSEU NEGATIVE 11/13/2016   KETONESU Negative 11/12/2016   RBCU TOO NUMEROUS TO COUNT 11/13/2016   BILIRUBINUR NEGATIVE 11/13/2016   UUROB 0.2 11/12/2016   NITRITE NEGATIVE 11/13/2016    Lab Results  Component Value Date   LABMICR See below: 11/12/2016   WBCUA 11-30 (A) 11/12/2016   RBCUA >30 (H) 11/12/2016   LABEPIT None seen 11/12/2016   MUCUS Present (A) 11/12/2016   BACTERIA RARE (A) 11/13/2016    Pertinent Imaging: CT abdomen pelvis on 11/14/2016 reviewed Foley catheter in appropriate position with resolution of hydronephrosis. Prostate is significantly enlarged.  Assessment & Plan:    1. Benign prostatic hyperplasia with urinary obstruction Continue Flomax and finasteride PSA ordered today to rule out prostate cancer as underlying factor, in the setting of retention, will only act on this information if his PSA markedly  elevated - PSA  2. Urinary retention Status post failed voiding trial last week At this point time, he is not a candidate for self cath as he be unable to perform this himself Not currently a surgical candidate We will maintain chronic indwelling Foley catheter with reattempted voiding trial in 3 weeks Voiding may improve with mobility and improvement in his overall status If he continues to be unable to void, may consider placement of a suprapubic tube  Return in about 3 weeks (around 01/05/2017) for voiding trial.  Hollice Espy, MD  [facility] [address], [place], [place] [address] [phone]

## 2016-12-16 LAB — PSA: Prostate Specific Ag, Serum: 1.5 ng/mL (ref 0.0–4.0)

## 2017-01-06 ENCOUNTER — Ambulatory Visit: Payer: Medicare Other | Admitting: Urology

## 2017-01-08 ENCOUNTER — Encounter (INDEPENDENT_AMBULATORY_CARE_PROVIDER_SITE_OTHER): Payer: Self-pay | Admitting: Orthopaedic Surgery

## 2017-01-08 ENCOUNTER — Ambulatory Visit (INDEPENDENT_AMBULATORY_CARE_PROVIDER_SITE_OTHER): Payer: Medicare Other | Admitting: Orthopaedic Surgery

## 2017-01-08 VITALS — BP 108/60 | HR 78

## 2017-01-08 DIAGNOSIS — Z89512 Acquired absence of left leg below knee: Secondary | ICD-10-CM

## 2017-01-08 DIAGNOSIS — M869 Osteomyelitis, unspecified: Secondary | ICD-10-CM

## 2017-01-08 NOTE — Progress Notes (Signed)
Post-Op Visit Note   Patient: [name]           Date of Birth: 1931-11-29           MRN: [id number] Visit Date: 01/08/2017 PCP: [name], MD   Assessment & Plan: Post left BKA.  We will refer him for prosthetic fitting and then he can go to the prosthetic clinic for rehab.  Stump is nicely healed.  We discussed arm exercises that he can work on in addition.  Follow-up here will be on a as needed basis.  Chief Complaint:  Chief Complaint  Patient presents with  . Left Leg - Follow-up   Visit Diagnoses: Post left below-knee amputation  Plan: Patient ready for prosthetic fitting.  Follow-Up Instructions: No Follow-up on file.   Orders:  No orders of the defined types were placed in this encounter.  No orders of the defined types were placed in this encounter.   Imaging: No results found.  PMFS History: Patient Active Problem List   Diagnosis Date Noted  . UTI (urinary tract infection) 11/13/2016  . Osteomyelitis of left foot ([place]) 10/06/2016  . Acute osteomyelitis of left foot ([place]) 10/06/2016  . CHF exacerbation ([place]) 09/24/2016  . Cellulitis and abscess of foot   . HCAP (healthcare-associated pneumonia) 09/09/2016  . COPD (chronic obstructive pulmonary disease) ([place]) 09/09/2016  . CAD (coronary artery disease) 09/09/2016  . Hyperlipidemia 09/09/2016  . Type 2 diabetes mellitus ([place]) 07/24/2016  . Pressure injury of skin 06/11/2016  . Atrial fibrillation with normal ventricular rate ([place]) 06/10/2016  . Esophageal stricture 06/09/2016  . CAD in native artery 06/08/2016  . Acute renal failure superimposed on stage 3 chronic kidney disease ([place]) 06/08/2016  . Chronic diastolic CHF (congestive heart failure) ([place]) 03/19/2016  . CAP (community acquired pneumonia) 03/19/2016  . Abnormal weight loss   . Thrush, oral 12/18/2013  . Chest pain at rest 12/18/2013  . Leukocytosis 12/18/2013  . Diabetes ([place]) 12/18/2013  . Chronic back pain 12/18/2013  .  Dysphagia 12/18/2013  . Odynophagia 12/18/2013  . Gastric mass 09/07/2013  . Personal history of colonic polyps 08/05/2013  . Nausea and vomiting 08/01/2013  . UTI (lower urinary tract infection) 12/09/2012  . Lower extremity edema 06/02/2012  . Essential hypertension 06/02/2012  . History of DVT (deep vein thrombosis) 06/02/2012  . Obesity (BMI 30-39.9): BMI 31.3 06/02/2012   Past Medical History:  Diagnosis Date  . BPH (benign prostatic hyperplasia)   . CAD (coronary artery disease)    Multivessel status post CABG 2011 - LIMA to LAD, SVG to diagonal, SVG to OM, SVG to PDA  . Cellulitis    12/15  . Chronic back pain   . Chronic diastolic CHF (congestive heart failure) ([place])   . CKD (chronic kidney disease), stage III ([place])   . COPD (chronic obstructive pulmonary disease) ([place])   . Essential hypertension   . Gastric mass    EGD 9/15  . GERD (gastroesophageal reflux disease)   . History of DVT (deep vein thrombosis)    Postphlebitic syndrome  . History of kidney stones   . HOH (hard of hearing)   . Hx of CABG   . Hyperlipidemia   . Persistent atrial fibrillation ([place])    a. s/p DCCV 03/2016.  [name] Sleep apnea    Stop Bang score of 5  . Type 2 diabetes mellitus ([place])   . UTI (urinary tract infection) 11/2016    Family History  Problem Relation Age  of Onset  . Colon cancer Son 65       deceased    Past Surgical History:  Procedure Laterality Date  . AMPUTATION Left 10/07/2016   Procedure: AMPUTATION BELOW KNEE;  Surgeon: [name], MD;  Location: [facility];  Service: Orthopedics;  Laterality: Left;  . CARDIOVERSION N/A 03/20/2016   Procedure: CARDIOVERSION;  Surgeon: Arnoldo Lenis, MD;  [facility];  Service: Endoscopy;  Laterality: N/A;  . CATARACT EXTRACTION W/PHACO Left 02/07/2016   Procedure: CATARACT EXTRACTION PHACO AND INTRAOCULAR LENS PLACEMENT (IOC);  Surgeon: [name], MD;  Location: [facility];  Service: Ophthalmology;  Laterality: Left;  CDE:   23.13  . COLONOSCOPY  2004   Dr. Laural Golden: three small polyps at cecum, path unknown, external hemorrhoids  . COLONOSCOPY N/A 08/16/2013   Dr. [name]: incomplete prep. multiple tubular adenomas, multiple biopsies. Needs surveillance in Aug 2016 due to poor prep  . CORONARY ARTERY BYPASS GRAFT     x5  . CORONARY ARTERY BYPASS GRAFT  2011  . CYSTOSCOPY N/A 12/12/2012   Procedure: CYSTOSCOPY FLEXIBLE;  Surgeon: Marissa Nestle, MD;  Location[facility];  Service: Urology;  Laterality: N/A;  . ESOPHAGEAL DILATION N/A 09/14/2016   Procedure: ESOPHAGEAL DILATION;  Surgeon: Daneil Dolin, MD;  [facility];  Service: Endoscopy;  Laterality: N/A;  . ESOPHAGOGASTRODUODENOSCOPY N/A 08/16/2013   Dr. [name]: normal esophagus s/p Maloney dilation, gastric erosions, submucosal gastric mass vs extrinsic mass  . ESOPHAGOGASTRODUODENOSCOPY (EGD) WITH PROPOFOL N/A 09/14/2016   Procedure: ESOPHAGOGASTRODUODENOSCOPY (EGD) WITH PROPOFOL;  Surgeon: Daneil Dolin, MD;  [facility];  Service: Endoscopy;  Laterality: N/A;  . EUS N/A 09/07/2013   Dr. 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 INCISION AND DRAINAGE ABSCESS N/A 12/20/2013   Procedure: INCISION AND DRAINAGE ABSCESS NECK;  Surgeon: Jamesetta So, MD;  Location: [facility];  Service: General;  Laterality: N/A;  . INCISION AND DRAINAGE ABSCESS Left 09/04/2016   Procedure: INCISION AND DRAINAGE ABSCESS;  Surgeon: Aviva Signs, MD;  Location: [facility];  Service: General;  Laterality: Left;  [name] MALONEY DILATION N/A 08/16/2013   Procedure: Venia Minks DILATION;  Surgeon: Daneil Dolin, MD;  [facility];  Service: Endoscopy;  Laterality: N/A;   Social History   Occupational History  . Occupation: Retired  Tobacco Use  . Smoking status: Former Smoker    Packs/day: 1.00    Years: 11.00    Pack years: 11.00    Last attempt to quit: 01/05/1956    Years since quitting: 61.0  . Smokeless tobacco: Never Used  Substance  and Sexual Activity  . Alcohol use: No  . Drug use: No  . Sexual activity: No

## 2017-01-08 NOTE — Addendum Note (Signed)
Addended byLaurann Montana on: 01/08/2017 03:10 PM   Modules accepted: Orders

## 2017-01-11 ENCOUNTER — Ambulatory Visit (INDEPENDENT_AMBULATORY_CARE_PROVIDER_SITE_OTHER): Payer: Medicare Other | Admitting: Urology

## 2017-01-11 VITALS — BP 100/60 | HR 79

## 2017-01-11 DIAGNOSIS — R339 Retention of urine, unspecified: Secondary | ICD-10-CM | POA: Diagnosis not present

## 2017-01-11 NOTE — Progress Notes (Signed)
Fill and Pull Catheter Removal  Patient is present today for a catheter removal.  Patient was cleaned and prepped in a sterile fashion 239ml of sterile water/ saline was instilled into the bladder when the patient felt the urge to urinate. 29ml of water was then drained from the balloon.  A 18FR foley cath was removed from the bladder no complications were noted .  Patient as then given some time to void on their own.  Patient can void  197ml on their own after some time.  Patient tolerated well.  Preformed by: Toniann Fail, LPN   Follow up/ Additional notes: Reinforced with caregiver if pt can not urinate by 3pm today and/or develops pain to RTC. Caregiver voiced understanding.   Blood pressure 100/60, pulse 79.

## 2017-01-19 ENCOUNTER — Telehealth (INDEPENDENT_AMBULATORY_CARE_PROVIDER_SITE_OTHER): Payer: Self-pay | Admitting: Orthopaedic Surgery

## 2017-01-19 NOTE — Telephone Encounter (Signed)
01/08/2017 OV NOTE FAXED TO Alison Stalling [phone]

## 2017-02-05 DIAGNOSIS — S72009A Fracture of unspecified part of neck of unspecified femur, initial encounter for closed fracture: Secondary | ICD-10-CM

## 2017-02-05 HISTORY — DX: Fracture of unspecified part of neck of unspecified femur, initial encounter for closed fracture: S72.009A

## 2017-02-22 ENCOUNTER — Telehealth (INDEPENDENT_AMBULATORY_CARE_PROVIDER_SITE_OTHER): Payer: Self-pay | Admitting: Radiology

## 2017-02-22 ENCOUNTER — Emergency Department (HOSPITAL_COMMUNITY): Payer: Medicare Other

## 2017-02-22 ENCOUNTER — Inpatient Hospital Stay (HOSPITAL_COMMUNITY)
Admission: EM | Admit: 2017-02-22 | Discharge: 2017-02-27 | DRG: 470 | Disposition: A | Discharge status: Skilled Nursing Facility | Payer: Medicare Other | Attending: Internal Medicine | Admitting: Internal Medicine

## 2017-02-22 ENCOUNTER — Encounter (HOSPITAL_COMMUNITY): Payer: Self-pay | Admitting: Emergency Medicine

## 2017-02-22 ENCOUNTER — Other Ambulatory Visit: Payer: Self-pay

## 2017-02-22 DIAGNOSIS — R748 Abnormal levels of other serum enzymes: Secondary | ICD-10-CM | POA: Diagnosis present

## 2017-02-22 DIAGNOSIS — I2510 Atherosclerotic heart disease of native coronary artery without angina pectoris: Secondary | ICD-10-CM | POA: Diagnosis present

## 2017-02-22 DIAGNOSIS — E1165 Type 2 diabetes mellitus with hyperglycemia: Secondary | ICD-10-CM | POA: Diagnosis present

## 2017-02-22 DIAGNOSIS — M6282 Rhabdomyolysis: Secondary | ICD-10-CM | POA: Diagnosis present

## 2017-02-22 DIAGNOSIS — B9689 Other specified bacterial agents as the cause of diseases classified elsewhere: Secondary | ICD-10-CM | POA: Diagnosis present

## 2017-02-22 DIAGNOSIS — E875 Hyperkalemia: Secondary | ICD-10-CM | POA: Diagnosis present

## 2017-02-22 DIAGNOSIS — M549 Dorsalgia, unspecified: Secondary | ICD-10-CM | POA: Diagnosis present

## 2017-02-22 DIAGNOSIS — I5032 Chronic diastolic (congestive) heart failure: Secondary | ICD-10-CM | POA: Diagnosis present

## 2017-02-22 DIAGNOSIS — J449 Chronic obstructive pulmonary disease, unspecified: Secondary | ICD-10-CM | POA: Diagnosis present

## 2017-02-22 DIAGNOSIS — T83511A Infection and inflammatory reaction due to indwelling urethral catheter, initial encounter: Secondary | ICD-10-CM

## 2017-02-22 DIAGNOSIS — H9190 Unspecified hearing loss, unspecified ear: Secondary | ICD-10-CM | POA: Diagnosis present

## 2017-02-22 DIAGNOSIS — D72829 Elevated white blood cell count, unspecified: Secondary | ICD-10-CM | POA: Diagnosis not present

## 2017-02-22 DIAGNOSIS — M869 Osteomyelitis, unspecified: Secondary | ICD-10-CM

## 2017-02-22 DIAGNOSIS — K219 Gastro-esophageal reflux disease without esophagitis: Secondary | ICD-10-CM | POA: Diagnosis present

## 2017-02-22 DIAGNOSIS — Z86718 Personal history of other venous thrombosis and embolism: Secondary | ICD-10-CM

## 2017-02-22 DIAGNOSIS — I4891 Unspecified atrial fibrillation: Secondary | ICD-10-CM | POA: Diagnosis present

## 2017-02-22 DIAGNOSIS — R17 Unspecified jaundice: Secondary | ICD-10-CM | POA: Diagnosis present

## 2017-02-22 DIAGNOSIS — E119 Type 2 diabetes mellitus without complications: Secondary | ICD-10-CM

## 2017-02-22 DIAGNOSIS — I10 Essential (primary) hypertension: Secondary | ICD-10-CM | POA: Diagnosis present

## 2017-02-22 DIAGNOSIS — R410 Disorientation, unspecified: Secondary | ICD-10-CM | POA: Diagnosis not present

## 2017-02-22 DIAGNOSIS — E11621 Type 2 diabetes mellitus with foot ulcer: Secondary | ICD-10-CM

## 2017-02-22 DIAGNOSIS — S72002A Fracture of unspecified part of neck of left femur, initial encounter for closed fracture: Secondary | ICD-10-CM | POA: Diagnosis present

## 2017-02-22 DIAGNOSIS — T8149XA Infection following a procedure, other surgical site, initial encounter: Secondary | ICD-10-CM | POA: Diagnosis not present

## 2017-02-22 DIAGNOSIS — I130 Hypertensive heart and chronic kidney disease with heart failure and stage 1 through stage 4 chronic kidney disease, or unspecified chronic kidney disease: Secondary | ICD-10-CM | POA: Diagnosis present

## 2017-02-22 DIAGNOSIS — S72142A Displaced intertrochanteric fracture of left femur, initial encounter for closed fracture: Secondary | ICD-10-CM | POA: Diagnosis not present

## 2017-02-22 DIAGNOSIS — E1122 Type 2 diabetes mellitus with diabetic chronic kidney disease: Secondary | ICD-10-CM | POA: Diagnosis present

## 2017-02-22 DIAGNOSIS — E860 Dehydration: Secondary | ICD-10-CM | POA: Diagnosis present

## 2017-02-22 DIAGNOSIS — N179 Acute kidney failure, unspecified: Secondary | ICD-10-CM | POA: Diagnosis present

## 2017-02-22 DIAGNOSIS — Y92009 Unspecified place in unspecified non-institutional (private) residence as the place of occurrence of the external cause: Secondary | ICD-10-CM | POA: Diagnosis not present

## 2017-02-22 DIAGNOSIS — R8281 Pyuria: Secondary | ICD-10-CM | POA: Diagnosis present

## 2017-02-22 DIAGNOSIS — D631 Anemia in chronic kidney disease: Secondary | ICD-10-CM | POA: Diagnosis present

## 2017-02-22 DIAGNOSIS — N189 Chronic kidney disease, unspecified: Secondary | ICD-10-CM | POA: Diagnosis present

## 2017-02-22 DIAGNOSIS — S72009A Fracture of unspecified part of neck of unspecified femur, initial encounter for closed fracture: Secondary | ICD-10-CM

## 2017-02-22 DIAGNOSIS — R109 Unspecified abdominal pain: Secondary | ICD-10-CM | POA: Diagnosis not present

## 2017-02-22 DIAGNOSIS — M25551 Pain in right hip: Secondary | ICD-10-CM | POA: Diagnosis not present

## 2017-02-22 DIAGNOSIS — N183 Chronic kidney disease, stage 3 unspecified: Secondary | ICD-10-CM | POA: Diagnosis present

## 2017-02-22 DIAGNOSIS — I481 Persistent atrial fibrillation: Secondary | ICD-10-CM | POA: Diagnosis present

## 2017-02-22 DIAGNOSIS — Z951 Presence of aortocoronary bypass graft: Secondary | ICD-10-CM

## 2017-02-22 DIAGNOSIS — L97509 Non-pressure chronic ulcer of other part of unspecified foot with unspecified severity: Secondary | ICD-10-CM

## 2017-02-22 DIAGNOSIS — G8929 Other chronic pain: Secondary | ICD-10-CM | POA: Diagnosis present

## 2017-02-22 DIAGNOSIS — G4730 Sleep apnea, unspecified: Secondary | ICD-10-CM | POA: Diagnosis present

## 2017-02-22 DIAGNOSIS — E871 Hypo-osmolality and hyponatremia: Secondary | ICD-10-CM | POA: Diagnosis present

## 2017-02-22 DIAGNOSIS — E1100 Type 2 diabetes mellitus with hyperosmolarity without nonketotic hyperglycemic-hyperosmolar coma (NKHHC): Secondary | ICD-10-CM

## 2017-02-22 DIAGNOSIS — Z5309 Procedure and treatment not carried out because of other contraindication: Secondary | ICD-10-CM | POA: Diagnosis not present

## 2017-02-22 DIAGNOSIS — Z09 Encounter for follow-up examination after completed treatment for conditions other than malignant neoplasm: Secondary | ICD-10-CM

## 2017-02-22 DIAGNOSIS — J411 Mucopurulent chronic bronchitis: Secondary | ICD-10-CM

## 2017-02-22 DIAGNOSIS — S72002S Fracture of unspecified part of neck of left femur, sequela: Secondary | ICD-10-CM | POA: Diagnosis not present

## 2017-02-22 DIAGNOSIS — N400 Enlarged prostate without lower urinary tract symptoms: Secondary | ICD-10-CM | POA: Diagnosis present

## 2017-02-22 DIAGNOSIS — Z79899 Other long term (current) drug therapy: Secondary | ICD-10-CM

## 2017-02-22 DIAGNOSIS — Z8711 Personal history of peptic ulcer disease: Secondary | ICD-10-CM

## 2017-02-22 DIAGNOSIS — Z794 Long term (current) use of insulin: Secondary | ICD-10-CM

## 2017-02-22 DIAGNOSIS — L8990 Pressure ulcer of unspecified site, unspecified stage: Secondary | ICD-10-CM | POA: Diagnosis present

## 2017-02-22 DIAGNOSIS — M5440 Lumbago with sciatica, unspecified side: Secondary | ICD-10-CM

## 2017-02-22 DIAGNOSIS — M25552 Pain in left hip: Secondary | ICD-10-CM

## 2017-02-22 DIAGNOSIS — N390 Urinary tract infection, site not specified: Secondary | ICD-10-CM | POA: Diagnosis present

## 2017-02-22 DIAGNOSIS — E1169 Type 2 diabetes mellitus with other specified complication: Secondary | ICD-10-CM

## 2017-02-22 DIAGNOSIS — W19XXXA Unspecified fall, initial encounter: Secondary | ICD-10-CM | POA: Diagnosis present

## 2017-02-22 DIAGNOSIS — Z87891 Personal history of nicotine dependence: Secondary | ICD-10-CM

## 2017-02-22 DIAGNOSIS — Z89512 Acquired absence of left leg below knee: Secondary | ICD-10-CM

## 2017-02-22 DIAGNOSIS — Z7901 Long term (current) use of anticoagulants: Secondary | ICD-10-CM

## 2017-02-22 DIAGNOSIS — R739 Hyperglycemia, unspecified: Secondary | ICD-10-CM | POA: Diagnosis present

## 2017-02-22 HISTORY — DX: Fracture of unspecified part of neck of unspecified femur, initial encounter for closed fracture: S72.009A

## 2017-02-22 LAB — URINALYSIS, ROUTINE W REFLEX MICROSCOPIC
BILIRUBIN URINE: NEGATIVE
GLUCOSE, UA: 50 mg/dL — AB
Ketones, ur: 5 mg/dL — AB
Nitrite: NEGATIVE
PH: 5 (ref 5.0–8.0)
Protein, ur: 100 mg/dL — AB
SPECIFIC GRAVITY, URINE: 1.012 (ref 1.005–1.030)

## 2017-02-22 LAB — CREATININE, SERUM
Creatinine, Ser: 1.81 mg/dL — ABNORMAL HIGH (ref 0.61–1.24)
GFR calc Af Amer: 38 mL/min — ABNORMAL LOW (ref >60)
GFR calc non Af Amer: 33 mL/min — ABNORMAL LOW (ref >60)

## 2017-02-22 LAB — COMPREHENSIVE METABOLIC PANEL
ALK PHOS: 73 U/L (ref 38–126)
ALT: 11 U/L — ABNORMAL LOW (ref 17–63)
ANION GAP: 14 (ref 5–15)
AST: 20 U/L (ref 15–41)
Albumin: 3.6 g/dL (ref 3.5–5.0)
BILIRUBIN TOTAL: 1.7 mg/dL — AB (ref 0.3–1.2)
BUN: 27 mg/dL — ABNORMAL HIGH (ref 6–20)
CALCIUM: 10.8 mg/dL — AB (ref 8.9–10.3)
CO2: 21 mmol/L — ABNORMAL LOW (ref 22–32)
Chloride: 98 mmol/L — ABNORMAL LOW (ref 101–111)
Creatinine, Ser: 1.9 mg/dL — ABNORMAL HIGH (ref 0.61–1.24)
GFR calc Af Amer: 36 mL/min — ABNORMAL LOW (ref >60)
GFR calc non Af Amer: 31 mL/min — ABNORMAL LOW (ref >60)
Glucose, Bld: 232 mg/dL — ABNORMAL HIGH (ref 65–99)
Potassium: 4.2 mmol/L (ref 3.5–5.1)
Sodium: 133 mmol/L — ABNORMAL LOW (ref 135–145)
Total Protein: 6.8 g/dL (ref 6.5–8.1)

## 2017-02-22 LAB — CBC WITH DIFFERENTIAL/PLATELET
Basophils Absolute: 0 10*3/uL (ref 0.0–0.1)
Basophils Relative: 0 %
EOS ABS: 0 10*3/uL (ref 0.0–0.7)
Eosinophils Relative: 0 %
HEMATOCRIT: 31.9 % — AB (ref 39.0–52.0)
HEMOGLOBIN: 10.5 g/dL — AB (ref 13.0–17.0)
Lymphocytes Relative: 4 %
Lymphs Abs: 0.7 10*3/uL (ref 0.7–4.0)
MCH: 25.5 pg — ABNORMAL LOW (ref 26.0–34.0)
MCHC: 32.9 g/dL (ref 30.0–36.0)
MCV: 77.4 fL — ABNORMAL LOW (ref 78.0–100.0)
MONO ABS: 0.5 10*3/uL (ref 0.1–1.0)
MONOS PCT: 3 %
NEUTROS ABS: 18.2 10*3/uL — AB (ref 1.7–7.7)
NEUTROS PCT: 93 %
Platelets: 169 10*3/uL (ref 150–400)
RBC: 4.12 MIL/uL — ABNORMAL LOW (ref 4.22–5.81)
RDW: 16.8 % — ABNORMAL HIGH (ref 11.5–15.5)
WBC: 19.4 10*3/uL — ABNORMAL HIGH (ref 4.0–10.5)

## 2017-02-22 LAB — CBC
HCT: 28.6 % — ABNORMAL LOW (ref 39.0–52.0)
Hemoglobin: 9.2 g/dL — ABNORMAL LOW (ref 13.0–17.0)
MCH: 25.3 pg — AB (ref 26.0–34.0)
MCHC: 32.2 g/dL (ref 30.0–36.0)
MCV: 78.6 fL (ref 78.0–100.0)
PLATELETS: 148 10*3/uL — AB (ref 150–400)
RBC: 3.64 MIL/uL — AB (ref 4.22–5.81)
RDW: 17.3 % — ABNORMAL HIGH (ref 11.5–15.5)
WBC: 14.6 10*3/uL — ABNORMAL HIGH (ref 4.0–10.5)

## 2017-02-22 LAB — GLUCOSE, CAPILLARY: Glucose-Capillary: 241 mg/dL — ABNORMAL HIGH (ref 65–99)

## 2017-02-22 LAB — CK: Total CK: 415 U/L — ABNORMAL HIGH (ref 49–397)

## 2017-02-22 MED ORDER — NITROGLYCERIN 0.4 MG SL SUBL: 0.4000 mg | SUBLINGUAL_TABLET | SUBLINGUAL | Status: DC | PRN

## 2017-02-22 MED ORDER — SODIUM CHLORIDE 0.9 % IV SOLN
INTRAVENOUS | Status: AC
  Administered 2017-02-22 – 2017-02-23 (×3): via INTRAVENOUS

## 2017-02-22 MED ORDER — IPRATROPIUM-ALBUTEROL 0.5-2.5 (3) MG/3ML IN SOLN: 3.0000 mL | Freq: Four times a day (QID) | RESPIRATORY_TRACT | Status: DC | PRN

## 2017-02-22 MED ORDER — CEFAZOLIN SODIUM-DEXTROSE 2-4 GM/100ML-% IV SOLN: 2.0000 g | INTRAVENOUS | Status: DC

## 2017-02-22 MED ORDER — SODIUM CHLORIDE 0.9 % IV BOLUS (SEPSIS)
1000.0000 mL | Freq: Once | INTRAVENOUS | Status: AC
  Administered 2017-02-22: 1000 mL via INTRAVENOUS

## 2017-02-22 MED ORDER — AMLODIPINE BESYLATE 10 MG PO TABS
10.0000 mg | ORAL_TABLET | Freq: Every day | ORAL | Status: DC
  Administered 2017-02-23 – 2017-02-27 (×5): 10 mg via ORAL
  Filled 2017-02-22 (×5): qty 1

## 2017-02-22 MED ORDER — CLONIDINE HCL 0.1 MG PO TABS
0.1000 mg | ORAL_TABLET | Freq: Two times a day (BID) | ORAL | Status: DC
  Administered 2017-02-23 – 2017-02-27 (×9): 0.1 mg via ORAL
  Filled 2017-02-22 (×9): qty 1

## 2017-02-22 MED ORDER — INSULIN ASPART 100 UNIT/ML ~~LOC~~ SOLN
0.0000 [IU] | Freq: Three times a day (TID) | SUBCUTANEOUS | Status: DC
  Administered 2017-02-23 (×2): 2 [IU] via SUBCUTANEOUS
  Administered 2017-02-23: 1 [IU] via SUBCUTANEOUS
  Administered 2017-02-24 (×2): 2 [IU] via SUBCUTANEOUS
  Administered 2017-02-25 (×2): 5 [IU] via SUBCUTANEOUS

## 2017-02-22 MED ORDER — SODIUM CHLORIDE 0.9 % IV SOLN
1.0000 g | INTRAVENOUS | Status: DC
  Administered 2017-02-22 – 2017-02-25 (×4): 1 g via INTRAVENOUS
  Filled 2017-02-22 (×4): qty 10

## 2017-02-22 MED ORDER — FLUOXETINE HCL 10 MG PO CAPS
10.0000 mg | ORAL_CAPSULE | Freq: Every day | ORAL | Status: DC
  Administered 2017-02-23 – 2017-02-27 (×5): 10 mg via ORAL
  Filled 2017-02-22 (×6): qty 1

## 2017-02-22 MED ORDER — FENTANYL CITRATE (PF) 100 MCG/2ML IJ SOLN
12.5000 ug | INTRAMUSCULAR | Status: DC | PRN
  Administered 2017-02-22: 12.5 ug via INTRAVENOUS
  Filled 2017-02-22: qty 2

## 2017-02-22 MED ORDER — FINASTERIDE 5 MG PO TABS
5.0000 mg | ORAL_TABLET | Freq: Every day | ORAL | Status: DC
  Administered 2017-02-23 – 2017-02-27 (×5): 5 mg via ORAL
  Filled 2017-02-22 (×5): qty 1

## 2017-02-22 MED ORDER — SENNOSIDES-DOCUSATE SODIUM 8.6-50 MG PO TABS
1.0000 | ORAL_TABLET | Freq: Every evening | ORAL | Status: DC | PRN
  Administered 2017-02-26: 1 via ORAL
  Filled 2017-02-22: qty 1

## 2017-02-22 MED ORDER — BISACODYL 10 MG RE SUPP
10.0000 mg | Freq: Every day | RECTAL | Status: DC | PRN
  Filled 2017-02-22 (×2): qty 1

## 2017-02-22 MED ORDER — METOPROLOL SUCCINATE ER 25 MG PO TB24
25.0000 mg | ORAL_TABLET | Freq: Every day | ORAL | Status: DC
  Administered 2017-02-23 – 2017-02-27 (×5): 25 mg via ORAL
  Filled 2017-02-22 (×5): qty 1

## 2017-02-22 MED ORDER — CHLORHEXIDINE GLUCONATE 4 % EX LIQD: 60.0000 mL | Freq: Once | CUTANEOUS | Status: DC

## 2017-02-22 MED ORDER — ACETAMINOPHEN 325 MG PO TABS
650.0000 mg | ORAL_TABLET | Freq: Four times a day (QID) | ORAL | Status: DC | PRN
  Administered 2017-02-24: 650 mg via ORAL
  Filled 2017-02-22: qty 2

## 2017-02-22 MED ORDER — ACETAMINOPHEN 650 MG RE SUPP: 650.0000 mg | RECTAL | Status: DC | PRN

## 2017-02-22 MED ORDER — LORAZEPAM 2 MG/ML IJ SOLN
0.5000 mg | INTRAMUSCULAR | Status: DC | PRN
  Administered 2017-02-23 – 2017-02-26 (×6): 0.5 mg via INTRAVENOUS
  Filled 2017-02-22 (×6): qty 1

## 2017-02-22 MED ORDER — LACTATED RINGERS IV SOLN
INTRAVENOUS | Status: DC
  Administered 2017-02-22: 16:00:00 via INTRAVENOUS

## 2017-02-22 MED ORDER — FENTANYL CITRATE (PF) 100 MCG/2ML IJ SOLN
12.5000 ug | INTRAMUSCULAR | Status: DC | PRN
  Administered 2017-02-23: 25 ug via INTRAVENOUS
  Administered 2017-02-24: 12.5 ug via INTRAVENOUS
  Administered 2017-02-25: 25 ug via INTRAVENOUS
  Filled 2017-02-22 (×3): qty 2

## 2017-02-22 MED ORDER — ENOXAPARIN SODIUM 30 MG/0.3ML ~~LOC~~ SOLN
30.0000 mg | SUBCUTANEOUS | Status: DC
  Administered 2017-02-23 – 2017-02-24 (×2): 30 mg via SUBCUTANEOUS
  Filled 2017-02-22 (×2): qty 0.3

## 2017-02-22 MED ORDER — PANTOPRAZOLE SODIUM 40 MG PO TBEC
40.0000 mg | DELAYED_RELEASE_TABLET | Freq: Every day | ORAL | Status: DC
  Administered 2017-02-23 – 2017-02-27 (×5): 40 mg via ORAL
  Filled 2017-02-22 (×5): qty 1

## 2017-02-22 MED ORDER — ONDANSETRON HCL 4 MG/2ML IJ SOLN: 4.0000 mg | Freq: Four times a day (QID) | INTRAMUSCULAR | Status: DC | PRN

## 2017-02-22 MED ORDER — FENTANYL CITRATE (PF) 100 MCG/2ML IJ SOLN
50.0000 ug | Freq: Once | INTRAMUSCULAR | Status: AC
  Administered 2017-02-22: 50 ug via INTRAMUSCULAR
  Filled 2017-02-22: qty 2

## 2017-02-22 MED ORDER — POVIDONE-IODINE 10 % EX SWAB: 2.0000 "application " | Freq: Once | CUTANEOUS | Status: DC

## 2017-02-22 MED ORDER — TAMSULOSIN HCL 0.4 MG PO CAPS
0.4000 mg | ORAL_CAPSULE | Freq: Every day | ORAL | Status: DC
  Administered 2017-02-23 – 2017-02-27 (×5): 0.4 mg via ORAL
  Filled 2017-02-22 (×5): qty 1

## 2017-02-22 NOTE — Telephone Encounter (Signed)
Dr Dayna Barker has called from [facility] ED, patient has had a fall and is there with a left hip fracture.  I have called Dr Lorin Mercy in the OR to advise and the RN from OR will call Dr Dayna Barker to speak with him regarding transfer and treatment for this patient.

## 2017-02-22 NOTE — ED Notes (Signed)
Report given to [facility] OR at this time.

## 2017-02-22 NOTE — Anesthesia Preprocedure Evaluation (Addendum)
Anesthesia Evaluation  Patient identified by MRN, date of birth, ID band Patient awake    Reviewed: Allergy & Precautions, NPO status , Patient's Chart, lab work & pertinent test results  Airway Mallampati: I  TM Distance: >3 FB Neck ROM: Full    Dental   Pulmonary COPD, former smoker (quit 1957),    Pulmonary exam normal        Cardiovascular hypertension, Pt. on medications and Pt. on home beta blockers + CAD, + CABG and + DVT  Normal cardiovascular exam+ dysrhythmias Atrial Fibrillation      Neuro/Psych    GI/Hepatic GERD  Medicated,  Endo/Other  diabetes (glu 232), Type 2, Insulin Dependent  Renal/GU Renal InsufficiencyRenal disease     Musculoskeletal   Abdominal   Peds  Hematology  (+) Blood dyscrasia (eliquis), ,   Anesthesia Other Findings   Reproductive/Obstetrics                            Anesthesia Physical Anesthesia Plan  ASA: III  Anesthesia Plan: General   Post-op Pain Management:    Induction: Intravenous  PONV Risk Score and Plan: 2 and Ondansetron  Airway Management Planned: Oral ETT  Additional Equipment:   Intra-op Plan:   Post-operative Plan: Extubation in OR  Informed Consent: I have reviewed the patients History and Physical, chart, labs and discussed the procedure including the risks, benefits and alternatives for the proposed anesthesia with the patient or authorized representative who has indicated his/her understanding and acceptance.     Plan Discussed with: CRNA and Surgeon  Anesthesia Plan Comments:         Anesthesia Quick Evaluation

## 2017-02-22 NOTE — Progress Notes (Signed)
Orthopedic Tech Progress Note Patient Details:  [name] 08-30-1931 [id number]  Patient ID: [name], male   DOB: May 27, 1931, 84 y.o.   MRN: [id number] Pt cant have ohf due to age restrictions  Karolee Stamps 02/22/2017, 6:58 PM

## 2017-02-22 NOTE — Consult Note (Addendum)
Reason for Consult:left femoral neck fracture, displaced Referring Physician: Murvin Natal MD Hospitalist   [name] is an 84 y.o. male.  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  He was unable to get to the cell phone to call for help.  He was seen at any pain in the emergency room where elevated creatinine was noted with acute on chronic renal failure as well as some dehydration.  Denied chest pain no evidence of a stroke.  X-rays emergency room demonstrated a left femoral  Neck displaced hip fracture.  He did not damage the stump of his BKA.  He has atrial fib and is on chronic Eliquis and had a yesterday.  I was called while doing surgery at [facility] told patient was not on any blood thinner and was brought to [facility] for surgery today and on finding out that he is on Eliquis his surgery will have to be delayed until Wednesday afternoon.  Past Medical History:  Diagnosis Date  . BPH (benign prostatic hyperplasia)   . CAD (coronary artery disease)    Multivessel status post CABG 2011 - LIMA to LAD, SVG to diagonal, SVG to OM, SVG to PDA  . Cellulitis    12/15  . Chronic back pain   . Chronic diastolic CHF (congestive heart failure) ([place])   . CKD (chronic kidney disease), stage III ([place])   . COPD (chronic obstructive pulmonary disease) ([place])   . Essential hypertension   . Gastric mass    EGD 9/15  . GERD (gastroesophageal reflux disease)   . History of DVT (deep vein thrombosis)    Postphlebitic syndrome  . History of kidney stones   . HOH (hard of hearing)   . Hx of CABG   . Hyperlipidemia   . Persistent atrial fibrillation ([place])    a. s/p DCCV 03/2016.  [name] Sleep apnea    Stop Bang score of 5  . Type 2 diabetes mellitus ([place])   . UTI (urinary tract infection) 11/2016    Past Surgical History:    Procedure Laterality Date  . AMPUTATION Left 10/07/2016   Procedure: AMPUTATION BELOW KNEE;  Surgeon: [name], MD;  Location: [facility];  Service: Orthopedics;  Laterality: Left;  . CARDIOVERSION N/A 03/20/2016   Procedure: CARDIOVERSION;  Surgeon: Arnoldo Lenis, MD;  [facility];  Service: Endoscopy;  Laterality: N/A;  . CATARACT EXTRACTION W/PHACO Left 02/07/2016   Procedure: CATARACT EXTRACTION PHACO AND INTRAOCULAR LENS PLACEMENT (IOC);  Surgeon: [name], MD;  [facility];  Service: Ophthalmology;  Laterality: Left;  CDE:  23.13  . COLONOSCOPY  2004   Dr. Laural Golden: three small polyps at cecum, path unknown, external hemorrhoids  . COLONOSCOPY N/A 08/16/2013   Dr. [name]: incomplete prep. multiple tubular adenomas, multiple biopsies. Needs surveillance in Aug 2016 due to poor prep  . CORONARY ARTERY BYPASS GRAFT     x5  . CORONARY ARTERY BYPASS GRAFT  2011  . CYSTOSCOPY N/A 12/12/2012   Procedure: CYSTOSCOPY FLEXIBLE;  Surgeon: Marissa Nestle, MD;  [facility];  Service: Urology;  Laterality: N/A;  . ESOPHAGEAL DILATION N/A 09/14/2016   Procedure: ESOPHAGEAL DILATION;  Surgeon: Daneil Dolin, MD;  [facility];  Service: Endoscopy;  Laterality: N/A;  . ESOPHAGOGASTRODUODENOSCOPY N/A  08/16/2013   Dr. [name]: normal esophagus s/p [facility] dilation, gastric erosions, submucosal gastric mass vs extrinsic mass  . ESOPHAGOGASTRODUODENOSCOPY (EGD) WITH PROPOFOL N/A 09/14/2016   Procedure: ESOPHAGOGASTRODUODENOSCOPY (EGD) WITH PROPOFOL;  Surgeon: Daneil Dolin, MD;  [facility];  Service: Endoscopy;  Laterality: N/A;  . EUS N/A 09/07/2013   Dr. 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 INCISION AND DRAINAGE ABSCESS N/A 12/20/2013   Procedure: INCISION AND DRAINAGE ABSCESS NECK;  Surgeon: Jamesetta So, MD;  Location: [facility];  Service: General;  Laterality: N/A;  . INCISION AND DRAINAGE ABSCESS Left 09/04/2016   Procedure:  INCISION AND DRAINAGE ABSCESS;  Surgeon: Aviva Signs, MD;  Location: [facility];  Service: General;  Laterality: Left;  [name] MALONEY DILATION N/A 08/16/2013   Procedure: Venia Minks DILATION;  Surgeon: Daneil Dolin, MD;  [facility];  Service: Endoscopy;  Laterality: N/A;    Family History  Problem Relation Age of Onset  . Colon cancer Son 93       deceased    Social History:  reports that he quit smoking about 61 years ago. He has a 11.00 pack-year smoking history. he has never used smokeless tobacco. He reports that he does not drink alcohol or use drugs.  Allergies: No Known Allergies  Medications: I have reviewed the patient's current medications.  Results for orders placed or performed during the hospital encounter of 02/22/17 (from the past 48 hour(s))  CBC with Differential     Status: Abnormal   Collection Time: 02/22/17 12:17 PM  Result Value Ref Range   WBC 19.4 (H) 4.0 - 10.5 K/uL   RBC 4.12 (L) 4.22 - 5.81 MIL/uL   Hemoglobin 10.5 (L) 13.0 - 17.0 g/dL   HCT 31.9 (L) 39.0 - 52.0 %   MCV 77.4 (L) 78.0 - 100.0 fL   MCH 25.5 (L) 26.0 - 34.0 pg   MCHC 32.9 30.0 - 36.0 g/dL   RDW 16.8 (H) 11.5 - 15.5 %   Platelets 169 150 - 400 K/uL    Comment: PLATELET COUNT CONFIRMED BY SMEAR   Neutrophils Relative % 93 %   Neutro Abs 18.2 (H) 1.7 - 7.7 K/uL   Lymphocytes Relative 4 %   Lymphs Abs 0.7 0.7 - 4.0 K/uL   Monocytes Relative 3 %   Monocytes Absolute 0.5 0.1 - 1.0 K/uL   Eosinophils Relative 0 %   Eosinophils Absolute 0.0 0.0 - 0.7 K/uL   Basophils Relative 0 %   Basophils Absolute 0.0 0.0 - 0.1 K/uL    Comment: Performed at [facility], [address] [place], [place] [address]  Comprehensive metabolic panel     Status: Abnormal   Collection Time: 02/22/17 12:17 PM  Result Value Ref Range   Sodium 133 (L) 135 - 145 mmol/L   Potassium 4.2 3.5 - 5.1 mmol/L   Chloride 98 (L) 101 - 111 mmol/L   CO2 21 (L) 22 - 32 mmol/L   Glucose, Bld 232 (H) 65 - 99 mg/dL   BUN  27 (H) 6 - 20 mg/dL   Creatinine, Ser 1.90 (H) 0.61 - 1.24 mg/dL   Calcium 10.8 (H) 8.9 - 10.3 mg/dL   Total Protein 6.8 6.5 - 8.1 g/dL   Albumin 3.6 3.5 - 5.0 g/dL   AST 20 15 - 41 U/L   ALT 11 (L) 17 - 63 U/L   Alkaline Phosphatase 73 38 - 126 U/L   Total Bilirubin 1.7 (  H) 0.3 - 1.2 mg/dL   GFR calc non Af Amer 31 (L) >60 mL/min   GFR calc Af Amer 36 (L) >60 mL/min    Comment: (NOTE) The eGFR has been calculated using the CKD EPI equation. This calculation has not been validated in all clinical situations. eGFR's persistently <60 mL/min signify possible Chronic Kidney Disease.    Anion gap 14 5 - 15    Comment: Performed at [facility], [address] [place], [place] [address]  CK     Status: Abnormal   Collection Time: 02/22/17 12:17 PM  Result Value Ref Range   Total CK 415 (H) 49 - 397 U/L    Comment: Performed at [facility], [address] [place], [place] [address]  Urinalysis, Routine w reflex microscopic     Status: Abnormal   Collection Time: 02/22/17  1:33 PM  Result Value Ref Range   Color, Urine YELLOW YELLOW   APPearance HAZY (A) CLEAR   Specific Gravity, Urine 1.012 1.005 - 1.030   pH 5.0 5.0 - 8.0   Glucose, UA 50 (A) NEGATIVE mg/dL   Hgb urine dipstick MODERATE (A) NEGATIVE   Bilirubin Urine NEGATIVE NEGATIVE   Ketones, ur 5 (A) NEGATIVE mg/dL   Protein, ur 100 (A) NEGATIVE mg/dL   Nitrite NEGATIVE NEGATIVE   Leukocytes, UA LARGE (A) NEGATIVE   RBC / HPF 0-5 0 - 5 RBC/hpf   WBC, UA TOO NUMEROUS TO COUNT 0 - 5 WBC/hpf   Bacteria, UA RARE (A) NONE SEEN   Squamous Epithelial / LPF 0-5 (A) NONE SEEN   WBC Clumps PRESENT     Comment: Performed at [facility], [address], [place], [place] [address]  Culture, blood (Routine X 2) w Reflex to ID Panel     Status: None (Preliminary result)   Collection Time: 02/22/17  2:44 PM  Result Value Ref Range   Specimen Description RIGHT ANTECUBITAL    Special Requests      BOTTLES DRAWN AEROBIC AND ANAEROBIC  Blood Culture adequate volume Performed at [facility], [address] [place], [place] [address]    Culture PENDING    Report Status PENDING   Culture, blood (Routine X 2) w Reflex to ID Panel     Status: None (Preliminary result)   Collection Time: 02/22/17  2:46 PM  Result Value Ref Range   Specimen Description LEFT ANTECUBITAL    Special Requests      BOTTLES DRAWN AEROBIC AND ANAEROBIC Blood Culture adequate volume Performed at [facility], [address] [place], [place] [address]    Culture PENDING    Report Status PENDING   Type and screen [place]     Status: None   Collection Time: 02/22/17  4:47 PM  Result Value Ref Range   ABO/RH(D) O NEG    Antibody Screen NEG    Sample Expiration      02/25/2017 Performed at [facility] [address] [place], [place] [address]     Dg Chest 1 View  Result Date: 02/22/2017 CLINICAL DATA:  Left hip pain status post fall. History of COPD and hypertension. EXAM: CHEST 1 VIEW COMPARISON:  Radiographs 11/13/2016 and 11/17/2016. FINDINGS: 1129 hr. Stable cardiomegaly status post median sternotomy and CABG. There is aortic atherosclerosis. Persistent low lung volumes with vascular congestion. No overt pulmonary edema, confluent airspace opacity or significant pleural effusion. IMPRESSION: Cardiomegaly and vascular congestion. Electronically Signed   By: Caryl Comes.D.  On: 02/22/2017 12:01   Dg Hip Unilat W Or Wo Pelvis 2-3 Views Left  Result Date: 02/22/2017 CLINICAL DATA:  Left hip pain after fall.  Initial encounter. EXAM: DG HIP (WITH OR WITHOUT PELVIS) 2-3V LEFT COMPARISON:  None. FINDINGS: Acute left femoral neck fracture with mild to moderate displacement. No evidence of pelvic ring fracture or diastasis. Osteopenia and atherosclerosis. IMPRESSION: Displaced left femoral neck fracture. Electronically Signed   By: Monte Fantasia M.D.   On: 02/22/2017 11:54    ROS view of systems positive for  chronic back pain, multivessel coronary artery disease, type 2 diabetes on insulin, atrial fib on Eliquis, chronic kidney disease with acute on chronic exacerbation due to dehydration from his fall and hip fracture.  Hypertension, GERD.  Otherwise negative as it pertains to HPI. Blood pressure 136/68, pulse 82, temperature 98.1 F (36.7 C), temperature source Oral, resp. rate (!) 24, SpO2 96 %. Physical Exam  Constitutional: He is oriented to person, place, and time. He appears well-developed and well-nourished.  HENT:  Head: Normocephalic and atraumatic.  Right Ear: External ear normal.  Left Ear: External ear normal.  Eyes: Conjunctivae are normal. Pupils are equal, round, and reactive to light.  Neck: Normal range of motion. Neck supple. No tracheal deviation present. No thyromegaly present.  Cardiovascular:  Irregular rate consistent with atrial fib.  Respiratory: Effort normal and breath sounds normal. He has no wheezes.  GI: Soft. Bowel sounds are normal. He exhibits no distension. There is no tenderness.  Neurological: He is alert and oriented to person, place, and time.  Skin: Skin is warm and dry.  Psychiatric: He has a normal mood and affect. His behavior is normal. Thought content normal.  Pain with left hip range of motion.  BKA stump has stump shrinker sock and again of the BKA is normal without bleeding.  Distal pulse right foot is palpable.  Assessment/Plan: Left displaced femoral neck fracture.  Post left BKA.  Patient has elevated CPK and there is some concern about rhabdomyolysis with acute on chronic kidney disease.  He is to 1.9 and previously was 1.11.  Surgery will be scheduled for Wednesday afternoon.  Lovenox until Tuesday  and stopped  for surgery Wednesday afternoon.  Reason for delay of surgery due to his Eliquis was discussed with patient risks of acute bleeding.  Risks of surgery were discussed including dislocation infection.  Urgent orthopedic order set used.   Questions were elicited and answered.  He understands and requests we proceed.  He will be n.p.o. after midnight Tuesday night  [name] 02/22/2017, 6:07 PM

## 2017-02-22 NOTE — Interval H&P Note (Signed)
History and Physical Interval Note:  02/22/2017 4:32 PM  [name]  has presented today for surgery, with the diagnosis of HIP FRACTURE  The various methods of treatment have been discussed with the patient and family. After consideration of risks, benefits and other options for treatment, the patient has consented to  Procedure(s): ARTHROPLASTY POSTERIOR (HEMIARTHROPLASTY) (Left) as a surgical intervention .  The patient's history has been reviewed, patient examined, no change in status, stable for surgery.  I have reviewed the patient's chart and labs.  Questions were answered to the patient's satisfaction.     [name]

## 2017-02-22 NOTE — ED Provider Notes (Signed)
Emergency Department Provider Note   I have reviewed the triage vital signs and the nursing notes.   HISTORY  Chief Complaint Hip Pain (fall)   HPI [name] is a 84 y.o. male history of coronary artery disease and recent left lower leg amputation who presents to the emergency department today after a fall.  Apparently the patient was trying to wear his new prosthetic, he received it on Friday, yesterday when he had a fall hurting his left hip is had difficult to bearing weight since then.  Patient was not post use of prosthetic until physical therapy came today.  He says he did hit his head but did not pass out, have a headache or have any vomiting or vision changes.  Is not on any blood thinners.  He says he did not hit or hurt anything else. No other associated or modifying symptoms.    Past Medical History:  Diagnosis Date  . BPH (benign prostatic hyperplasia)   . CAD (coronary artery disease)    Multivessel status post CABG 2011 - LIMA to LAD, SVG to diagonal, SVG to OM, SVG to PDA  . Cellulitis    12/15  . Chronic back pain   . Chronic diastolic CHF (congestive heart failure) ([place])   . CKD (chronic kidney disease), stage III ([place])   . COPD (chronic obstructive pulmonary disease) ([place])   . Essential hypertension   . Gastric mass    EGD 9/15  . GERD (gastroesophageal reflux disease)   . History of DVT (deep vein thrombosis)    Postphlebitic syndrome  . History of kidney stones   . HOH (hard of hearing)   . Hx of CABG   . Hyperlipidemia   . Persistent atrial fibrillation ([place])    a. s/p DCCV 03/2016.  [name] Sleep apnea    Stop Bang score of 5  . Type 2 diabetes mellitus ([place])   . UTI (urinary tract infection) 11/2016    Patient Active Problem List   Diagnosis Date Noted  . Closed left hip fracture ([place]) 02/22/2017  . GERD (gastroesophageal reflux disease) 02/22/2017  . CKD (chronic kidney disease), stage III ([place]) 02/22/2017  . Hyperglycemia 02/22/2017  .  Anemia in chronic kidney disease (CKD) 02/22/2017  . Hypercalcemia 02/22/2017  . Moderate dehydration 02/22/2017  . Fall as cause of accidental injury at home as place of occurrence 02/22/2017  . S/P BKA (below knee amputation) unilateral, left ([place]) 02/22/2017  . Bilateral hip pain 02/22/2017  . Pyuria 02/22/2017  . Hyperbilirubinemia 02/22/2017  . UTI (urinary tract infection) 11/13/2016  . Cellulitis and abscess of foot   . COPD (chronic obstructive pulmonary disease) ([place]) 09/09/2016  . CAD (coronary artery disease) 09/09/2016  . Hyperlipidemia 09/09/2016  . Type 2 diabetes mellitus ([place]) 07/24/2016  . Pressure injury of skin 06/11/2016  . Atrial fibrillation with normal ventricular rate ([place]) 06/10/2016  . Esophageal stricture 06/09/2016  . CAD in native artery 06/08/2016  . Acute renal failure superimposed on stage 3 chronic kidney disease ([place]) 06/08/2016  . Chronic diastolic CHF (congestive heart failure) ([place]) 03/19/2016  . Abnormal weight loss   . Thrush, oral 12/18/2013  . Chest pain at rest 12/18/2013  . Leukocytosis 12/18/2013  . Diabetes ([place]) 12/18/2013  . Chronic back pain 12/18/2013  . Dysphagia 12/18/2013  . Odynophagia 12/18/2013  . Gastric mass 09/07/2013  . Personal history of colonic polyps 08/05/2013  . Nausea and vomiting 08/01/2013  . UTI (lower urinary tract infection) 12/09/2012  .  Lower extremity edema 06/02/2012  . Essential hypertension 06/02/2012  . History of DVT (deep vein thrombosis) 06/02/2012  . Obesity (BMI 30-39.9): BMI 31.3 06/02/2012    Past Surgical History:  Procedure Laterality Date  . AMPUTATION Left 10/07/2016   Procedure: AMPUTATION BELOW KNEE;  Surgeon: [name], MD;  Location: [facility];  Service: Orthopedics;  Laterality: Left;  . CARDIOVERSION N/A 03/20/2016   Procedure: CARDIOVERSION;  Surgeon: Arnoldo Lenis, MD;  [facility];  Service: Endoscopy;  Laterality: N/A;  . CATARACT EXTRACTION W/PHACO Left  02/07/2016   Procedure: CATARACT EXTRACTION PHACO AND INTRAOCULAR LENS PLACEMENT (IOC);  Surgeon: [name], MD;  Location: [facility];  Service: Ophthalmology;  Laterality: Left;  CDE:  23.13  . COLONOSCOPY  2004   Dr. Laural Golden: three small polyps at cecum, path unknown, external hemorrhoids  . COLONOSCOPY N/A 08/16/2013   Dr. [name]: incomplete prep. multiple tubular adenomas, multiple biopsies. Needs surveillance in Aug 2016 due to poor prep  . CORONARY ARTERY BYPASS GRAFT     x5  . CORONARY ARTERY BYPASS GRAFT  2011  . CYSTOSCOPY N/A 12/12/2012   Procedure: CYSTOSCOPY FLEXIBLE;  Surgeon: Marissa Nestle, MD;  Location: [facility];  Service: Urology;  Laterality: N/A;  . ESOPHAGEAL DILATION N/A 09/14/2016   Procedure: ESOPHAGEAL DILATION;  Surgeon: Daneil Dolin, MD;  [facility];  Service: Endoscopy;  Laterality: N/A;  . ESOPHAGOGASTRODUODENOSCOPY N/A 08/16/2013   Dr. [name]: normal esophagus s/p Maloney dilation, gastric erosions, submucosal gastric mass vs extrinsic mass  . ESOPHAGOGASTRODUODENOSCOPY (EGD) WITH PROPOFOL N/A 09/14/2016   Procedure: ESOPHAGOGASTRODUODENOSCOPY (EGD) WITH PROPOFOL;  Surgeon: Daneil Dolin, MD;  [facility];  Service: Endoscopy;  Laterality: N/A;  . EUS N/A 09/07/2013   Dr. 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 INCISION AND DRAINAGE ABSCESS N/A 12/20/2013   Procedure: INCISION AND DRAINAGE ABSCESS NECK;  Surgeon: Jamesetta So, MD;  Location[facility];  Service: General;  Laterality: N/A;  . INCISION AND DRAINAGE ABSCESS Left 09/04/2016   Procedure: INCISION AND DRAINAGE ABSCESS;  Surgeon: Aviva Signs, MD;  Location[facility];  Service: General;  Laterality: Left;  [name] MALONEY DILATION N/A 08/16/2013   Procedure: Venia Minks DILATION;  Surgeon: Daneil Dolin, MD;  [facility];  Service: Endoscopy;  Laterality: N/A;    Current Outpatient Rx  . Order #: [id number] Class: Normal  . Order #: [id number] Class: Historical  Med  . Order #: [id number] Class: Historical Med  . Order #: [id number] Class: Historical Med  . Order #: [id number] Class: Historical Med  . Order #: [id number] Class: Historical Med  . Order #: [id number] Class: Historical Med  . Order #: [id number] Class: No Print  . Order #: [id number] Class: Normal  . Order #: [id number] Class: Historical Med  . Order #: [id number] Class: Print  . Order #: [id number] Class: Normal  . Order #: [id number] Class: Print  . Order #: [id number] Class: Historical Med  . Order #: [id number] Class: No Print  . Order #: [id number] Class: Historical Med  . Order #: [id number] Class: Historical Med  . Order #: [id number] Class: Print  . Order #: [id number] Class: No Print    Allergies Patient has no known allergies.  Family History  Problem Relation Age of Onset  . Colon cancer Son 83       deceased    Social History Social History   Tobacco Use  . Smoking status: Former Smoker    Packs/day: 1.00    Years: 11.00  Pack years: 11.00    Last attempt to quit: 01/05/1956    Years since quitting: 61.1  . Smokeless tobacco: Never Used  Substance Use Topics  . Alcohol use: No  . Drug use: No    Review of Systems  All other systems negative except as documented in the HPI. All pertinent positives and negatives as reviewed in the HPI. ____________________________________________   PHYSICAL EXAM:  VITAL SIGNS: ED Triage Vitals  Enc Vitals Group     BP 02/22/17 1024 (!) 125/102     Pulse Rate 02/22/17 1024 81     Resp 02/22/17 1024 18     Temp 02/22/17 1024 98.1 F (36.7 C)     Temp Source 02/22/17 1024 Oral     SpO2 02/22/17 1024 97 %     Weight --     Constitutional: Alert and oriented. Well appearing and in no acute distress. Eyes: Conjunctivae are normal. PERRL. EOMI. Head: Atraumatic. Nose: No congestion/rhinnorhea. Mouth/Throat: Mucous membranes are moist.  Oropharynx non-erythematous. Neck: No stridor.  No meningeal signs.   Cardiovascular: Normal rate,  regular rhythm. Good peripheral circulation. Grossly normal heart sounds.   Respiratory: Normal respiratory effort.  No retractions. Lungs CTAB. Gastrointestinal: Soft and nontender. No distention.  Musculoskeletal: mid tibial amputation c/d/i. Tenderness with ROM of L hip and palpation of anterior L hip.  Neurologic:  Normal speech and language. No gross focal neurologic deficits are appreciated.  Skin:  Skin is warm, dry and intact. No rash noted.   ____________________________________________   LABS (all labs ordered are listed, but only abnormal results are displayed)  Labs Reviewed  CBC WITH DIFFERENTIAL/PLATELET - Abnormal; Notable for the following components:      Result Value   WBC 19.4 (*)    RBC 4.12 (*)    Hemoglobin 10.5 (*)    HCT 31.9 (*)    MCV 77.4 (*)    MCH 25.5 (*)    RDW 16.8 (*)    Neutro Abs 18.2 (*)    All other components within normal limits  COMPREHENSIVE METABOLIC PANEL - Abnormal; Notable for the following components:   Sodium 133 (*)    Chloride 98 (*)    CO2 21 (*)    Glucose, Bld 232 (*)    BUN 27 (*)    Creatinine, Ser 1.90 (*)    Calcium 10.8 (*)    ALT 11 (*)    Total Bilirubin 1.7 (*)    GFR calc non Af Amer 31 (*)    GFR calc Af Amer 36 (*)    All other components within normal limits  URINALYSIS, ROUTINE W REFLEX MICROSCOPIC - Abnormal; Notable for the following components:   APPearance HAZY (*)    Glucose, UA 50 (*)    Hgb urine dipstick MODERATE (*)    Ketones, ur 5 (*)    Protein, ur 100 (*)    Leukocytes, UA LARGE (*)    Bacteria, UA RARE (*)    Squamous Epithelial / LPF 0-5 (*)    All other components within normal limits  CK - Abnormal; Notable for the following components:   Total CK 415 (*)    All other components within normal limits  CULTURE, BLOOD (ROUTINE X 2)  CULTURE, BLOOD (ROUTINE X 2)   ____________________________________________  EKG   EKG Interpretation  Date/Time:  Monday February 22 2017 12:18:10  EST Ventricular Rate:  83 PR Interval:    QRS Duration: 134 QT Interval:  398 QTC Calculation:  482 R Axis:   109 Text Interpretation:  Atrial fibrillation Ventricular premature complex RBBB and LPFB Baseline wander in lead(s) III aVL V2 No significant change since last tracing Confirmed by Merrily Pew [phone]) on 02/22/2017 1:11:06 PM       ____________________________________________  RADIOLOGY  Dg Chest 1 View  Result Date: 02/22/2017 CLINICAL DATA:  Left hip pain status post fall. History of COPD and hypertension. EXAM: CHEST 1 VIEW COMPARISON:  Radiographs 11/13/2016 and 11/17/2016. FINDINGS: 1129 hr. Stable cardiomegaly status post median sternotomy and CABG. There is aortic atherosclerosis. Persistent low lung volumes with vascular congestion. No overt pulmonary edema, confluent airspace opacity or significant pleural effusion. IMPRESSION: Cardiomegaly and vascular congestion. Electronically Signed   By: Richardean Sale M.D.   On: 02/22/2017 12:01   Dg Hip Unilat W Or Wo Pelvis 2-3 Views Left  Result Date: 02/22/2017 CLINICAL DATA:  Left hip pain after fall.  Initial encounter. EXAM: DG HIP (WITH OR WITHOUT PELVIS) 2-3V LEFT COMPARISON:  None. FINDINGS: Acute left femoral neck fracture with mild to moderate displacement. No evidence of pelvic ring fracture or diastasis. Osteopenia and atherosclerosis. IMPRESSION: Displaced left femoral neck fracture. Electronically Signed   By: Monte Fantasia M.D.   On: 02/22/2017 11:54    ____________________________________________   PROCEDURES  Procedure(s) performed:   Procedures   ____________________________________________   INITIAL IMPRESSION / ASSESSMENT AND PLAN / ED COURSE eval for L hip fracture. Low suspicion for intracranial injury without persistent signs/symptoms and not on anticoagulation  Hip fracture present.  Discussed with Dr. Lorin Mercy who wants him n.p.o. and will see him on arrival to [facility].  Discussed with  medicine who will admit and transfer for the same.  Pertinent labs & imaging results that were available during my care of the patient were reviewed by me and considered in my medical decision making (see chart for details).  ____________________________________________  FINAL CLINICAL IMPRESSION(S) / ED DIAGNOSES  Final diagnoses:  Hip fx ([place])     MEDICATIONS GIVEN DURING THIS VISIT:  Medications  fentaNYL (SUBLIMAZE) injection 12.5 mcg (12.5 mcg Intravenous Given 02/22/17 1459)  0.9 %  sodium chloride infusion ( Intravenous New Bag/Given 02/22/17 1454)  cefTRIAXone (ROCEPHIN) 1 g in sodium chloride 0.9 % 100 mL IVPB (1 g Intravenous Transfusing/Transfer 02/22/17 1526)  ondansetron (ZOFRAN) injection 4 mg (not administered)  fentaNYL (SUBLIMAZE) injection 50 mcg (50 mcg Intramuscular Given 02/22/17 1127)  sodium chloride 0.9 % bolus 1,000 mL (0 mLs Intravenous Stopped 02/22/17 1454)     NEW OUTPATIENT MEDICATIONS STARTED DURING THIS VISIT:  New Prescriptions   No medications on file    Note:  This note was prepared with assistance of Dragon voice recognition software. Occasional wrong-word or sound-a-like substitutions may have occurred due to the inherent limitations of voice recognition software.   Merrily Pew, MD 02/22/17 [phone]

## 2017-02-22 NOTE — ED Triage Notes (Signed)
Pt had amputation to left lower leg over month ago. Pt was at home using new prosthetic leg and walker and fell hurting left hip.

## 2017-02-22 NOTE — ED Notes (Signed)
Pt to xray at this time.

## 2017-02-22 NOTE — ED Notes (Signed)
Pt denies O2 use at home, placed on 1L [place].

## 2017-02-22 NOTE — H&P (View-Only) (Signed)
Reason for Consult:left femoral neck fracture, displaced Referring Physician: Murvin Natal MD Hospitalist   [name] is an 84 y.o. male.  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  He was unable to get to the cell phone to call for help.  He was seen at any pain in the emergency room where elevated creatinine was noted with acute on chronic renal failure as well as some dehydration.  Denied chest pain no evidence of a stroke.  X-rays emergency room demonstrated a left femoral  Neck displaced hip fracture.  He did not damage the stump of his BKA.  He has atrial fib and is on chronic Eliquis and had a yesterday.  I was called while doing surgery at [facility] told patient was not on any blood thinner and was brought to [facility] for surgery today and on finding out that he is on Eliquis his surgery will have to be delayed until Wednesday afternoon.  Past Medical History:  Diagnosis Date  . BPH (benign prostatic hyperplasia)   . CAD (coronary artery disease)    Multivessel status post CABG 2011 - LIMA to LAD, SVG to diagonal, SVG to OM, SVG to PDA  . Cellulitis    12/15  . Chronic back pain   . Chronic diastolic CHF (congestive heart failure) ([place])   . CKD (chronic kidney disease), stage III ([place])   . COPD (chronic obstructive pulmonary disease) ([place])   . Essential hypertension   . Gastric mass    EGD 9/15  . GERD (gastroesophageal reflux disease)   . History of DVT (deep vein thrombosis)    Postphlebitic syndrome  . History of kidney stones   . HOH (hard of hearing)   . Hx of CABG   . Hyperlipidemia   . Persistent atrial fibrillation ([place])    a. s/p DCCV 03/2016.  [name] Sleep apnea    Stop Bang score of 5  . Type 2 diabetes mellitus ([place])   . UTI (urinary tract infection) 11/2016    Past Surgical History:    Procedure Laterality Date  . AMPUTATION Left 10/07/2016   Procedure: AMPUTATION BELOW KNEE;  Surgeon: [name], MD;  Location: [facility];  Service: Orthopedics;  Laterality: Left;  . CARDIOVERSION N/A 03/20/2016   Procedure: CARDIOVERSION;  Surgeon: Arnoldo Lenis, MD;  [facility];  Service: Endoscopy;  Laterality: N/A;  . CATARACT EXTRACTION W/PHACO Left 02/07/2016   Procedure: CATARACT EXTRACTION PHACO AND INTRAOCULAR LENS PLACEMENT (IOC);  Surgeon: [name], MD;  Location: [facility];  Service: Ophthalmology;  Laterality: Left;  CDE:  23.13  . COLONOSCOPY  2004   Dr. Laural Golden: three small polyps at cecum, path unknown, external hemorrhoids  . COLONOSCOPY N/A 08/16/2013   Dr. [name]: incomplete prep. multiple tubular adenomas, multiple biopsies. Needs surveillance in Aug 2016 due to poor prep  . CORONARY ARTERY BYPASS GRAFT     x5  . CORONARY ARTERY BYPASS GRAFT  2011  . CYSTOSCOPY N/A 12/12/2012   Procedure: CYSTOSCOPY FLEXIBLE;  Surgeon: Marissa Nestle, MD;  Location[facility];  Service: Urology;  Laterality: N/A;  . ESOPHAGEAL DILATION N/A 09/14/2016   Procedure: ESOPHAGEAL DILATION;  Surgeon: Daneil Dolin, MD;  [facility];  Service: Endoscopy;  Laterality: N/A;  . ESOPHAGOGASTRODUODENOSCOPY N/A  08/16/2013   Dr. [name]: normal esophagus s/p [facility] dilation, gastric erosions, submucosal gastric mass vs extrinsic mass  . ESOPHAGOGASTRODUODENOSCOPY (EGD) WITH PROPOFOL N/A 09/14/2016   Procedure: ESOPHAGOGASTRODUODENOSCOPY (EGD) WITH PROPOFOL;  Surgeon: Daneil Dolin, MD;  [facility];  Service: Endoscopy;  Laterality: N/A;  . EUS N/A 09/07/2013   Dr. 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 INCISION AND DRAINAGE ABSCESS N/A 12/20/2013   Procedure: INCISION AND DRAINAGE ABSCESS NECK;  Surgeon: Jamesetta So, MD;  Location: [facility];  Service: General;  Laterality: N/A;  . INCISION AND DRAINAGE ABSCESS Left 09/04/2016   Procedure:  INCISION AND DRAINAGE ABSCESS;  Surgeon: Aviva Signs, MD;  Location[facility];  Service: General;  Laterality: Left;  [name] MALONEY DILATION N/A 08/16/2013   Procedure: Venia Minks DILATION;  Surgeon: Daneil Dolin, MD;  [facility];  Service: Endoscopy;  Laterality: N/A;    Family History  Problem Relation Age of Onset  . Colon cancer Son 86       deceased    Social History:  reports that he quit smoking about 61 years ago. He has a 11.00 pack-year smoking history. he has never used smokeless tobacco. He reports that he does not drink alcohol or use drugs.  Allergies: No Known Allergies  Medications: I have reviewed the patient's current medications.  Results for orders placed or performed during the hospital encounter of 02/22/17 (from the past 48 hour(s))  CBC with Differential     Status: Abnormal   Collection Time: 02/22/17 12:17 PM  Result Value Ref Range   WBC 19.4 (H) 4.0 - 10.5 K/uL   RBC 4.12 (L) 4.22 - 5.81 MIL/uL   Hemoglobin 10.5 (L) 13.0 - 17.0 g/dL   HCT 31.9 (L) 39.0 - 52.0 %   MCV 77.4 (L) 78.0 - 100.0 fL   MCH 25.5 (L) 26.0 - 34.0 pg   MCHC 32.9 30.0 - 36.0 g/dL   RDW 16.8 (H) 11.5 - 15.5 %   Platelets 169 150 - 400 K/uL    Comment: PLATELET COUNT CONFIRMED BY SMEAR   Neutrophils Relative % 93 %   Neutro Abs 18.2 (H) 1.7 - 7.7 K/uL   Lymphocytes Relative 4 %   Lymphs Abs 0.7 0.7 - 4.0 K/uL   Monocytes Relative 3 %   Monocytes Absolute 0.5 0.1 - 1.0 K/uL   Eosinophils Relative 0 %   Eosinophils Absolute 0.0 0.0 - 0.7 K/uL   Basophils Relative 0 %   Basophils Absolute 0.0 0.0 - 0.1 K/uL    Comment: Performed at [facility], [address], [place], [place] [address]  Comprehensive metabolic panel     Status: Abnormal   Collection Time: 02/22/17 12:17 PM  Result Value Ref Range   Sodium 133 (L) 135 - 145 mmol/L   Potassium 4.2 3.5 - 5.1 mmol/L   Chloride 98 (L) 101 - 111 mmol/L   CO2 21 (L) 22 - 32 mmol/L   Glucose, Bld 232 (H) 65 - 99 mg/dL   BUN  27 (H) 6 - 20 mg/dL   Creatinine, Ser 1.90 (H) 0.61 - 1.24 mg/dL   Calcium 10.8 (H) 8.9 - 10.3 mg/dL   Total Protein 6.8 6.5 - 8.1 g/dL   Albumin 3.6 3.5 - 5.0 g/dL   AST 20 15 - 41 U/L   ALT 11 (L) 17 - 63 U/L   Alkaline Phosphatase 73 38 - 126 U/L   Total Bilirubin 1.7 (  H) 0.3 - 1.2 mg/dL   GFR calc non Af Amer 31 (L) >60 mL/min   GFR calc Af Amer 36 (L) >60 mL/min    Comment: (NOTE) The eGFR has been calculated using the CKD EPI equation. This calculation has not been validated in all clinical situations. eGFR's persistently <60 mL/min signify possible Chronic Kidney Disease.    Anion gap 14 5 - 15    Comment: Performed at [facility], [address] [place], [place] [address]  CK     Status: Abnormal   Collection Time: 02/22/17 12:17 PM  Result Value Ref Range   Total CK 415 (H) 49 - 397 U/L    Comment: Performed at [facility], [address], [place], [place] [address]  Urinalysis, Routine w reflex microscopic     Status: Abnormal   Collection Time: 02/22/17  1:33 PM  Result Value Ref Range   Color, Urine YELLOW YELLOW   APPearance HAZY (A) CLEAR   Specific Gravity, Urine 1.012 1.005 - 1.030   pH 5.0 5.0 - 8.0   Glucose, UA 50 (A) NEGATIVE mg/dL   Hgb urine dipstick MODERATE (A) NEGATIVE   Bilirubin Urine NEGATIVE NEGATIVE   Ketones, ur 5 (A) NEGATIVE mg/dL   Protein, ur 100 (A) NEGATIVE mg/dL   Nitrite NEGATIVE NEGATIVE   Leukocytes, UA LARGE (A) NEGATIVE   RBC / HPF 0-5 0 - 5 RBC/hpf   WBC, UA TOO NUMEROUS TO COUNT 0 - 5 WBC/hpf   Bacteria, UA RARE (A) NONE SEEN   Squamous Epithelial / LPF 0-5 (A) NONE SEEN   WBC Clumps PRESENT     Comment: Performed at [facility], [address], [place], [place] [address]  Culture, blood (Routine X 2) w Reflex to ID Panel     Status: None (Preliminary result)   Collection Time: 02/22/17  2:44 PM  Result Value Ref Range   Specimen Description RIGHT ANTECUBITAL    Special Requests      BOTTLES DRAWN AEROBIC AND ANAEROBIC  Blood Culture adequate volume Performed at [facility], [address] [place], [place] [address]    Culture PENDING    Report Status PENDING   Culture, blood (Routine X 2) w Reflex to ID Panel     Status: None (Preliminary result)   Collection Time: 02/22/17  2:46 PM  Result Value Ref Range   Specimen Description LEFT ANTECUBITAL    Special Requests      BOTTLES DRAWN AEROBIC AND ANAEROBIC Blood Culture adequate volume Performed at [facility], [address], [place], [place] [address]    Culture PENDING    Report Status PENDING   Type and screen [place]     Status: None   Collection Time: 02/22/17  4:47 PM  Result Value Ref Range   ABO/RH(D) O NEG    Antibody Screen NEG    Sample Expiration      02/25/2017 Performed at [facility] [address], [place], [place] [address]     Dg Chest 1 View  Result Date: 02/22/2017 CLINICAL DATA:  Left hip pain status post fall. History of COPD and hypertension. EXAM: CHEST 1 VIEW COMPARISON:  Radiographs 11/13/2016 and 11/17/2016. FINDINGS: 1129 hr. Stable cardiomegaly status post median sternotomy and CABG. There is aortic atherosclerosis. Persistent low lung volumes with vascular congestion. No overt pulmonary edema, confluent airspace opacity or significant pleural effusion. IMPRESSION: Cardiomegaly and vascular congestion. Electronically Signed   By: Caryl Comes.D.  On: 02/22/2017 12:01   Dg Hip Unilat W Or Wo Pelvis 2-3 Views Left  Result Date: 02/22/2017 CLINICAL DATA:  Left hip pain after fall.  Initial encounter. EXAM: DG HIP (WITH OR WITHOUT PELVIS) 2-3V LEFT COMPARISON:  None. FINDINGS: Acute left femoral neck fracture with mild to moderate displacement. No evidence of pelvic ring fracture or diastasis. Osteopenia and atherosclerosis. IMPRESSION: Displaced left femoral neck fracture. Electronically Signed   By: Monte Fantasia M.D.   On: 02/22/2017 11:54    ROS view of systems positive for  chronic back pain, multivessel coronary artery disease, type 2 diabetes on insulin, atrial fib on Eliquis, chronic kidney disease with acute on chronic exacerbation due to dehydration from his fall and hip fracture.  Hypertension, GERD.  Otherwise negative as it pertains to HPI. Blood pressure 136/68, pulse 82, temperature 98.1 F (36.7 C), temperature source Oral, resp. rate (!) 24, SpO2 96 %. Physical Exam  Constitutional: He is oriented to person, place, and time. He appears well-developed and well-nourished.  HENT:  Head: Normocephalic and atraumatic.  Right Ear: External ear normal.  Left Ear: External ear normal.  Eyes: Conjunctivae are normal. Pupils are equal, round, and reactive to light.  Neck: Normal range of motion. Neck supple. No tracheal deviation present. No thyromegaly present.  Cardiovascular:  Irregular rate consistent with atrial fib.  Respiratory: Effort normal and breath sounds normal. He has no wheezes.  GI: Soft. Bowel sounds are normal. He exhibits no distension. There is no tenderness.  Neurological: He is alert and oriented to person, place, and time.  Skin: Skin is warm and dry.  Psychiatric: He has a normal mood and affect. His behavior is normal. Thought content normal.  Pain with left hip range of motion.  BKA stump has stump shrinker sock and again of the BKA is normal without bleeding.  Distal pulse right foot is palpable.  Assessment/Plan: Left displaced femoral neck fracture.  Post left BKA.  Patient has elevated CPK and there is some concern about rhabdomyolysis with acute on chronic kidney disease.  He is to 1.9 and previously was 1.11.  Surgery will be scheduled for Wednesday afternoon.  Lovenox until Tuesday  and stopped  for surgery Wednesday afternoon.  Reason for delay of surgery due to his Eliquis was discussed with patient risks of acute bleeding.  Risks of surgery were discussed including dislocation infection.  Urgent orthopedic order set used.   Questions were elicited and answered.  He understands and requests we proceed.  He will be n.p.o. after midnight Tuesday night  [name] 02/22/2017, 6:07 PM

## 2017-02-22 NOTE — Progress Notes (Addendum)
Patient ID: [name], male   DOB: [birth year], 84 y.o.   MRN: [id number] Reported that pt was not on any blood thinners by [facility] MD, pt on eliquis and took it yesterday. Has A fib, bridge vs no bridge by hospitalist. Surgery cancelled and reposted for Wed afternoon and will not give Eliquis until post op. My cell [phone]

## 2017-02-22 NOTE — ED Notes (Signed)
Report given to [facility]. 

## 2017-02-22 NOTE — H&P (Signed)
History and Physical  [name] UXN:[id number] DOB: 01-31-1931 DOA: 02/22/2017  Referring physician: Dolly Rias  PCP: [name], MD   Chief Complaint: Fall at home  HPI: [name] is a 84 y.o. male with BPH, multivessel coronary artery disease, chronic back pain, type 2 diabetes mellitus, atrial fibrillation on Eliquis, hypertension, GERD and chronic kidney disease and other past medical history detailed below presented to the emergency department brought in by a friend who found him down at home on the floor.  He had been on the floor for the entire night.  He was unable to get up to get to a telephone or get any help.  Fortunately his friend found him earlier today.  The patient is recently status post having a left below-knee amputation 10/07/16 by Dr. Lorin Mercy.  The patient had received a prosthesis 4 days ago and he started using it without the assistance of physical therapy.  He was supposed to wait until physical therapy came out today to start using that prostatic.  He said he fell and hurt his left hip and has had a difficult time being able to bear any weight since that time.  He says that he did hit his head but he did not pass out or have a headache.  He denies vomiting he denies visual changes.  The patient says that he has not had anything to eat or drink in 2 days.  He reports that he is continuing to have pain in both of his hips.  He denies fever and chills.  ED course: The patient was evaluated in the ED and noted to have a leukocytosis white blood cell count of 19.4.  He was noted to be clinically dehydrated.  He was given IV fluid hydration.  He was given some pain medication.  An x-ray of his hips revealed an acute left femoral neck fracture.  His orthopedist Dr. Lorin Mercy was consulted who recommended the patient be transferred to [facility] for surgery consideration.  The patient also was noted to have an abnormal urinalysis consistent with the UTI.  Ceftriaxone was  ordered.  His chest x-ray was unremarkable.  He was also noted to have an anemia, hyperkalemia, hyperbilirubinemia, hyponatremia, elevated CK.  He is being transferred to [facility] for surgical evaluation for the acute left femoral neck fracture.    Review of Systems: All systems reviewed and apart from history of presenting illness, are negative.  Past Medical History:  Diagnosis Date  . BPH (benign prostatic hyperplasia)   . CAD (coronary artery disease)    Multivessel status post CABG 2011 - LIMA to LAD, SVG to diagonal, SVG to OM, SVG to PDA  . Cellulitis    12/15  . Chronic back pain   . Chronic diastolic CHF (congestive heart failure) ([place])   . CKD (chronic kidney disease), stage III ([place])   . COPD (chronic obstructive pulmonary disease) ([place])   . Essential hypertension   . Gastric mass    EGD 9/15  . GERD (gastroesophageal reflux disease)   . History of DVT (deep vein thrombosis)    Postphlebitic syndrome  . History of kidney stones   . HOH (hard of hearing)   . Hx of CABG   . Hyperlipidemia   . Persistent atrial fibrillation ([place])    a. s/p DCCV 03/2016.  [name] Sleep apnea    Stop Bang score of 5  . Type 2 diabetes mellitus ([place])   . UTI (urinary tract infection)  11/2016   Past Surgical History:  Procedure Laterality Date  . AMPUTATION Left 10/07/2016   Procedure: AMPUTATION BELOW KNEE;  Surgeon: [name], MD;  Location: [facility];  Service: Orthopedics;  Laterality: Left;  . CARDIOVERSION N/A 03/20/2016   Procedure: CARDIOVERSION;  Surgeon: Arnoldo Lenis, MD;  [facility];  Service: Endoscopy;  Laterality: N/A;  . CATARACT EXTRACTION W/PHACO Left 02/07/2016   Procedure: CATARACT EXTRACTION PHACO AND INTRAOCULAR LENS PLACEMENT (IOC);  Surgeon: [name], MD;  Location: [facility];  Service: Ophthalmology;  Laterality: Left;  CDE:  23.13  . COLONOSCOPY  2004   Dr. Laural Golden: three small polyps at cecum, path unknown, external hemorrhoids  . COLONOSCOPY N/A  08/16/2013   Dr. [name]: incomplete prep. multiple tubular adenomas, multiple biopsies. Needs surveillance in Aug 2016 due to poor prep  . CORONARY ARTERY BYPASS GRAFT     x5  . CORONARY ARTERY BYPASS GRAFT  2011  . CYSTOSCOPY N/A 12/12/2012   Procedure: CYSTOSCOPY FLEXIBLE;  Surgeon: Marissa Nestle, MD;  Location: [facility];  Service: Urology;  Laterality: N/A;  . ESOPHAGEAL DILATION N/A 09/14/2016   Procedure: ESOPHAGEAL DILATION;  Surgeon: Daneil Dolin, MD;  [facility];  Service: Endoscopy;  Laterality: N/A;  . ESOPHAGOGASTRODUODENOSCOPY N/A 08/16/2013   Dr. [name]: normal esophagus s/p Maloney dilation, gastric erosions, submucosal gastric mass vs extrinsic mass  . ESOPHAGOGASTRODUODENOSCOPY (EGD) WITH PROPOFOL N/A 09/14/2016   Procedure: ESOPHAGOGASTRODUODENOSCOPY (EGD) WITH PROPOFOL;  Surgeon: Daneil Dolin, MD;  [facility];  Service: Endoscopy;  Laterality: N/A;  . EUS N/A 09/07/2013   Dr. 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 INCISION AND DRAINAGE ABSCESS N/A 12/20/2013   Procedure: INCISION AND DRAINAGE ABSCESS NECK;  Surgeon: Jamesetta So, MD;  Location: [facility];  Service: General;  Laterality: N/A;  . INCISION AND DRAINAGE ABSCESS Left 09/04/2016   Procedure: INCISION AND DRAINAGE ABSCESS;  Surgeon: Aviva Signs, MD;  Location[facility];  Service: General;  Laterality: Left;  [name] MALONEY DILATION N/A 08/16/2013   Procedure: Venia Minks DILATION;  Surgeon: Daneil Dolin, MD;  [facility];  Service: Endoscopy;  Laterality: N/A;   Social History:  reports that he quit smoking about 61 years ago. He has a 11.00 pack-year smoking history. he has never used smokeless tobacco. He reports that he does not drink alcohol or use drugs.  No Known Allergies  Family History  Problem Relation Age of Onset  . Colon cancer Son 73       deceased    Prior to Admission medications   Medication Sig Start Date End Date Taking? Authorizing  Provider  amLODipine (NORVASC) 10 MG tablet Take 1 tablet (10 mg total) by mouth daily. 09/27/16  Yes Dondiego, Richard, MD  apixaban (ELIQUIS) 5 MG TABS tablet Take 5 mg by mouth 2 (two) times daily.   Yes [provider]  cholecalciferol (VITAMIN D) 400 units TABS tablet Take 400 Units by mouth daily.   Yes [provider]  cloNIDine (CATAPRES) 0.1 MG tablet Take 0.1 mg by mouth 2 (two) times daily.   Yes [provider]  diphenhydrAMINE (SOMINEX) 25 MG tablet Take 25 mg by mouth at bedtime as needed for sleep.   Yes [provider]  finasteride (PROSCAR) 5 MG tablet Take 5 mg daily by mouth.   Yes [provider]  FLUoxetine (PROZAC) 10 MG tablet Take 10 mg by mouth daily.  Yes [provider]  furosemide (LASIX) 40 MG tablet Take 1 tablet (40 mg total) daily as needed by mouth (weight gain of 3 or more pounds in 24-48 hour period). 11/19/16  Yes Mariel Aloe, MD  insulin glargine (LANTUS) 100 UNIT/ML injection Inject 0.15 mLs (15 Units total) at bedtime into the skin. 11/19/16  Yes Mariel Aloe, MD  insulin lispro (HUMALOG) 100 UNIT/ML injection Inject 20 Units into the skin daily.   Yes [provider]  LORazepam (ATIVAN) 0.5 MG tablet Take 1 tablet (0.5 mg total) 4 (four) times daily by mouth. 11/19/16  Yes Mariel Aloe, MD  metoprolol succinate (TOPROL-XL) 25 MG 24 hr tablet Take 3 tablets (75 mg total) by mouth daily. Take with or immediately following a meal. Patient taking differently: Take 25 mg by mouth daily. Take with or immediately following a meal. 06/16/16  Yes Dondiego, Richard, MD  oxyCODONE (ROXICODONE) 15 MG immediate release tablet Take 1 tablet (15 mg total) every 8 (eight) hours as needed by mouth for pain. Patient taking differently: Take 5 mg by mouth every 8 (eight) hours as needed for pain.  11/19/16  Yes Mariel Aloe, MD  pantoprazole (PROTONIX) 40 MG tablet Take 40 mg by mouth daily.   Yes  [provider]  potassium chloride SA (K-DUR,KLOR-CON) 20 MEQ tablet Take 1 tablet (20 mEq total) by mouth daily as needed. Take when Lasix is needed. Patient taking differently: Take 20 mEq daily as needed by mouth (with each dose of lasix (furosemide)).  10/10/16  Yes Debbe Odea, MD  tamsulosin (FLOMAX) 0.4 MG CAPS capsule Take 0.4 mg by mouth daily.   Yes [provider]  ipratropium-albuterol (DUONEB) 0.5-2.5 (3) MG/3ML SOLN Take 3 mLs by nebulization every 6 (six) hours as needed.    [provider]  nitroGLYCERIN (NITROSTAT) 0.4 MG SL tablet Place 1 tablet (0.4 mg total) under the tongue every 5 (five) minutes as needed for chest pain. 12/22/13   [name], MD  polyethylene glycol [facility] Floria Raveling) packet Take 17 g daily by mouth. 11/20/16   Mariel Aloe, MD   Physical Exam: Vitals:   02/22/17 1100 02/22/17 1130 02/22/17 1200 02/22/17 1300  BP: 119/65  135/65 137/72  Pulse: 64  83 94  Resp: 18  18 17   Temp:      TempSrc:      SpO2: (!) 89% 97% 100% 97%    General exam: Moderately built and nourished patient, lying comfortably supine on the gurney in no obvious distress.  Head, eyes and ENT: Nontraumatic and normocephalic. Pupils equally reacting to light and accommodation. Edentulous. Oral mucosa pale and dry.  Neck: Supple. No JVD, carotid bruit or thyromegaly.  Lymphatics: No lymphadenopathy.  Respiratory system: Clear to auscultation. No increased work of breathing.  Cardiovascular system: S1 and S2 heard, irregularly irregular. No JVD, murmurs, gallops, clicks or pedal edema.  Gastrointestinal system: Abdomen is nondistended, soft and nontender. Normal bowel sounds heard. No organomegaly or masses appreciated.  Central nervous system: Alert and oriented. No focal neurological deficits.  Extremities: left leg externally rotated. Left BKA with sock on.   Peripheral pulses symmetrically felt bilateral.   Skin: No rashes or acute  findings.  Musculoskeletal system: Negative exam.  Psychiatry: Pleasant and cooperative.  Labs on Admission:  Basic Metabolic Panel: Recent Labs  Lab 02/22/17 1217  NA 133*  K 4.2  CL 98*  CO2 21*  GLUCOSE 232*  BUN 27*  CREATININE 1.90*  CALCIUM 10.8*   Liver Function Tests: Recent Labs  Lab 02/22/17 1217  AST 20  ALT 11*  ALKPHOS 73  BILITOT 1.7*  PROT 6.8  ALBUMIN 3.6   No results for input(s): LIPASE, AMYLASE in the last 168 hours. No results for input(s): AMMONIA in the last 168 hours. CBC: Recent Labs  Lab 02/22/17 1217  WBC 19.4*  NEUTROABS 18.2*  HGB 10.5*  HCT 31.9*  MCV 77.4*  PLT 169   Cardiac Enzymes: No results for input(s): CKTOTAL, CKMB, CKMBINDEX, TROPONINI in the last 168 hours.  BNP (last 3 results) No results for input(s): PROBNP in the last 8760 hours. CBG: No results for input(s): GLUCAP in the last 168 hours.  Radiological Exams on Admission: Dg Chest 1 View  Result Date: 02/22/2017 CLINICAL DATA:  Left hip pain status post fall. History of COPD and hypertension. EXAM: CHEST 1 VIEW COMPARISON:  Radiographs 11/13/2016 and 11/17/2016. FINDINGS: 1129 hr. Stable cardiomegaly status post median sternotomy and CABG. There is aortic atherosclerosis. Persistent low lung volumes with vascular congestion. No overt pulmonary edema, confluent airspace opacity or significant pleural effusion. IMPRESSION: Cardiomegaly and vascular congestion. Electronically Signed   By: Richardean Sale M.D.   On: 02/22/2017 12:01   Dg Hip Unilat W Or Wo Pelvis 2-3 Views Left  Result Date: 02/22/2017 CLINICAL DATA:  Left hip pain after fall.  Initial encounter. EXAM: DG HIP (WITH OR WITHOUT PELVIS) 2-3V LEFT COMPARISON:  None. FINDINGS: Acute left femoral neck fracture with mild to moderate displacement. No evidence of pelvic ring fracture or diastasis. Osteopenia and atherosclerosis. IMPRESSION: Displaced left femoral neck fracture. Electronically Signed   By:  Monte Fantasia M.D.   On: 02/22/2017 11:54   EKG: Personally reviewed:  atrial fibrillation with controlled ventricular response   Assessment/Plan Active Problems:   Essential hypertension   History of DVT (deep vein thrombosis)   Leukocytosis   Chronic back pain   Chronic diastolic CHF (congestive heart failure) (HCC)   Acute renal failure superimposed on stage 3 chronic kidney disease (HCC)   Atrial fibrillation with normal ventricular rate (HCC)   Pressure injury of skin   Type 2 diabetes mellitus (HCC)   COPD (chronic obstructive pulmonary disease) (HCC)   CAD (coronary artery disease)   UTI (urinary tract infection)   Closed left hip fracture (HCC)   GERD (gastroesophageal reflux disease)   CKD (chronic kidney disease), stage III (HCC)   Hyperglycemia   Anemia in chronic kidney disease (CKD)   Hypercalcemia   Moderate dehydration   Fall as cause of accidental injury at home as place of occurrence   S/P BKA (below knee amputation) unilateral, left (HCC)   Bilateral hip pain   Pyuria   Hyperbilirubinemia   1. Acute left femoral neck fracture - The patient is being admitted to a MedSurg bed at [facility].  Dr. Lorin Mercy orthopedics has been consulted and will see the patient when he arrives to [facility] for surgical consideration.  The patient will be kept n.p.o.  IV pain medications ordered.  Nausea medications ordered as well.  Postop management per orthopedics. 2. Fall at home-the patient was down for multiple hours on the floor.  His CK is elevated.  Monitor CK levels for rhabdomyolysis.  He has been started on IV fluid hydration.  He also received a bolus of fluids in the emergency department.  He will need a physical therapy evaluation when he is medically stabilized. 3. Moderate dehydration-patient has not been eating or  drinking while he was down, he is being hydrated with IV fluids at this time.  He is currently n.p.o. for possible orthopedic surgery. 4. Anemia and chronic  kidney disease-type and screen has been ordered.  Follow hemoglobin closely.  He likely is going to need postop transfusion.  Follow CBC. 5. Leukocytosis-markedly elevated, a urinary source of infection has been found.  Treating urinary tract infection and follow CBC. 6. Urinary tract infection-patient had a Foley catheter removed in January/2018.  He has market pyuria and has been started on empiric ceftriaxone IV.  Blood cultures have been ordered.  Urine culture pending. 7. Hyperglycemia-we will order a sensitive sliding scale to cover elevated blood glucose readings while n.p.o. 8. Hypercalcemia/hyperbilirubinemia-would like to repeat these labs after adequate IV fluid hydration. 9. Atrial fibrillation-heart rate is currently controlled.  He has been anticoagulated with Eliquis.  Holding Eliquis at this time pending surgery.  Resume Eliquis when okay with orthopedics. 10. Coronary artery disease-stable no symptoms of chest pain.  No EKG findings to suggest ischemia. 11. Essential hypertension-resume home blood pressure medications. 12. COPD-stable. 13. Chronic diastolic congestive heart failure- patient is clinically dehydrated and gentle IV fluid hydration has been ordered.  His heart failure is compensated at this time. 14. GERD- Protonix ordered for GI protection. 15. Chronic back pain-IV pain medication ordered, holding home oral pain medications. 16. Acute on chronic kidney disease stage III- IV fluid hydration has been ordered.  Renally dose medications as appropriate.  Follow BMP. 17. Elevated CK levels-I am concerned about the patient developing rhabdomyolysis given that he was down on the floor for several hours however we are hydrating him well and will follow his CK levels closely.  DVT Prophylaxis: Lovenox until eliquis is restarted Code Status: Partial Family Communication: I spoke on the telephone to a family member per patient's request Disposition Plan: Likely will need SNF  placement after hip replacement surgery  Time spent: 25 minutes  Irwin Brakeman, MD [facility] Pager [phone]  If 7PM-7AM, please contact night-coverage [email or web address] Password [facility] 02/22/2017, 2:36 PM

## 2017-02-23 ENCOUNTER — Encounter (HOSPITAL_COMMUNITY): Payer: Self-pay | Admitting: General Practice

## 2017-02-23 LAB — BASIC METABOLIC PANEL
Anion gap: 12 (ref 5–15)
BUN: 20 mg/dL (ref 6–20)
CHLORIDE: 101 mmol/L (ref 101–111)
CO2: 22 mmol/L (ref 22–32)
CREATININE: 1.48 mg/dL — AB (ref 0.61–1.24)
Calcium: 10.1 mg/dL (ref 8.9–10.3)
GFR calc Af Amer: 49 mL/min — ABNORMAL LOW (ref >60)
GFR, EST NON AFRICAN AMERICAN: 42 mL/min — AB (ref >60)
GLUCOSE: 193 mg/dL — AB (ref 65–99)
Potassium: 3.5 mmol/L (ref 3.5–5.1)
SODIUM: 135 mmol/L (ref 135–145)

## 2017-02-23 LAB — CBC
HCT: 27.7 % — ABNORMAL LOW (ref 39.0–52.0)
Hemoglobin: 9 g/dL — ABNORMAL LOW (ref 13.0–17.0)
MCH: 25.6 pg — AB (ref 26.0–34.0)
MCHC: 32.5 g/dL (ref 30.0–36.0)
MCV: 78.7 fL (ref 78.0–100.0)
PLATELETS: 152 10*3/uL (ref 150–400)
RBC: 3.52 MIL/uL — ABNORMAL LOW (ref 4.22–5.81)
RDW: 17.5 % — ABNORMAL HIGH (ref 11.5–15.5)
WBC: 13 10*3/uL — ABNORMAL HIGH (ref 4.0–10.5)

## 2017-02-23 LAB — GLUCOSE, CAPILLARY
Glucose-Capillary: 189 mg/dL — ABNORMAL HIGH (ref 65–99)
Glucose-Capillary: 175 mg/dL — ABNORMAL HIGH (ref 65–99)
Glucose-Capillary: 149 mg/dL — ABNORMAL HIGH (ref 65–99)
Glucose-Capillary: 106 mg/dL — ABNORMAL HIGH (ref 65–99)

## 2017-02-23 LAB — CK: CK TOTAL: 115 U/L (ref 49–397)

## 2017-02-23 MED ORDER — GLUCERNA SHAKE PO LIQD
237.0000 mL | Freq: Three times a day (TID) | ORAL | Status: DC
  Administered 2017-02-23 – 2017-02-27 (×10): 237 mL via ORAL

## 2017-02-23 MED ORDER — GUAIFENESIN 200 MG PO TABS
200.0000 mg | ORAL_TABLET | Freq: Four times a day (QID) | ORAL | Status: DC | PRN
  Filled 2017-02-23: qty 1

## 2017-02-23 NOTE — Social Work (Signed)
CSW acknowledging SNF consult, awaiting PT/OT recommendations.   [name], [name] [phone]

## 2017-02-23 NOTE — Progress Notes (Signed)
PROGRESS NOTE    [name]  ZHY:[id number] DOB: 22-Oct-1931 DOA: 02/22/2017 PCP: [name], MD  Outpatient Specialists:   Brief Narrative: Patient is an 84 year old Caucasian male with past medical history significant for BPH, multivessel coronary artery disease, chronic back pain, type 2 diabetes mellitus, atrial fibrillation on Eliquis, hypertension, GERD and chronic kidney disease.  The patient is recently status post having a left below-knee amputation on 10/07/16 by Dr. Lorin Mercy.  The patient had received a prosthesis 4 days prior to presentation and he started using it without the assistance of physical therapy.  Patient fell and hurt his left hip and has had a difficult time being able to bear any weight since that time.  Patient was brought to the emergency room by a friend who found him down at home on the floor.  He had been on the floor for the entire night.  He was unable to get up to get to a telephone or get any help.  Patient has been admitted with left hip fracture.  Orthopedic team is involved.  Surgery is planned, possibly, for tomorrow.  Assessment & Plan:   Active Problems:   Essential hypertension   History of DVT (deep vein thrombosis)   Leukocytosis   Chronic back pain   Chronic diastolic CHF (congestive heart failure) (HCC)   Acute renal failure superimposed on stage 3 chronic kidney disease (HCC)   Atrial fibrillation with normal ventricular rate (HCC)   Pressure injury of skin   Type 2 diabetes mellitus (HCC)   COPD (chronic obstructive pulmonary disease) (HCC)   CAD (coronary artery disease)   UTI (urinary tract infection)   Closed left hip fracture (HCC)   GERD (gastroesophageal reflux disease)   CKD (chronic kidney disease), stage III (HCC)   Hyperglycemia   Anemia in chronic kidney disease (CKD)   Hypercalcemia   Moderate dehydration   Fall as cause of accidental injury at home as place of occurrence   S/P BKA (below knee amputation) unilateral,  left (HCC)   Bilateral hip pain   Pyuria   Hyperbilirubinemia    Acute left femoral neck fracture - The patient is being admitted to a MedSurg bed at [facility].  Dr. Lorin Mercy orthopedics has been consulted and will see the patient when he arrives to [facility] for surgical consideration.  The patient will be kept n.p.o.  IV pain medications ordered.  Nausea medications ordered as well.  Postop management per orthopedics. -Possible surgery on 02/24/2017. -Orthopedic input is highly appreciated.  Fall at home: -the patient was down for multiple hours on the floor.  His CK is elevated.  Monitor CK levels for rhabdomyolysis.  He has been started on IV fluid hydration.  He also received a bolus of fluids in the emergency department.  He will need a physical therapy evaluation when he is medically stabilized. -Physical therapy input. -CPK today was 115.   Moderate dehydration: -patient has not been eating or drinking while he was down, he is being hydrated with IV fluids at this time.  He is currently n.p.o. for possible orthopedic surgery. -Cautious hydration.   Anemia and chronic kidney disease: -type and screen has been ordered.  Follow hemoglobin closely.  He likely is going to need postop transfusion.  Follow CBC. -Hemoglobin today is 9.  Leukocytosis: -markedly elevated, a urinary source of infection has been found.  Treating urinary tract infection and follow CBC. -Leukocytosis is slowly resolving.  WBC is 13 today.  Urinary tract infection: -  patient had a Foley catheter removed in January/2018.  He has market pyuria and has been started on empiric ceftriaxone IV.  Blood cultures have been ordered.  Urine culture pending.  Follow final results.  Hyperglycemia: -we will order a sensitive sliding scale to cover elevated blood glucose readings while n.p.o. -Continue to optimize.  Hypercalcemia/hyperbilirubinemia: -would like to repeat these labs after adequate IV fluid  hydration. -Calcium is down to 10.1.  Atrial fibrillation: -heart rate is currently controlled.  He has been anticoagulated with Eliquis.  Holding Eliquis at this time pending surgery.  Resume Eliquis when okay with orthopedics.  Coronary artery disease: -stable no symptoms of chest pain.  No EKG findings to suggest ischemia.  Essential hypertension: -resume home blood pressure medications.  COPD: -Stable.  Chronic diastolic congestive heart failure: - patient is clinically dehydrated and gentle IV fluid hydration has been ordered.  His heart failure is compensated at this time.  GERD: - Protonix ordered for GI protection.  Chronic back pain: -IV pain medication ordered, holding home oral pain medications.  Acute on chronic kidney disease stage III: - IV fluid hydration has been ordered.  Renally dose medications as appropriate.  Follow BMP. -Serum creatinine is down to 1.48.  Elevated CK levels: -I am concerned about the patient developing rhabdomyolysis given that he was down on the floor for several hours however we are hydrating him well and will follow his CK levels closely. -CPK done 115.  No need to continue monitoring CPK.  DVT Prophylaxis: Lovenox until eliquis is restarted Code Status: Partial Family Communication: I spoke on the telephone to a family member per patient's request Disposition Plan: Likely will need SNF placement after hip replacement surgery    Consultants:   Orthopedic team.  Procedures:   Surgery is planned for tomorrow, 02/24/2017.  Antimicrobials:   IV Rocephin.   Subjective: No new complaint Left hip pain is controlled. No chest pain or shortness of breath  Objective: Vitals:   02/22/17 2112 02/23/17 0456 02/23/17 1421 02/23/17 1434  BP: 131/66 136/68 138/64   Pulse: 85 86 75   Resp: 18 18 18    Temp: 98.1 F (36.7 C) 98 F (36.7 C) 98.2 F (36.8 C)   TempSrc: Oral  Oral   SpO2: 99% 100% 100%   Weight:    74.4 kg (164  lb 0.4 oz)  Height:    5\' 8"  (1.727 m)    Intake/Output Summary (Last 24 hours) at 02/23/2017 1544 Last data filed at 02/23/2017 1422 Gross per 24 hour  Intake 571 ml  Output 750 ml  Net -179 ml   Filed Weights   02/23/17 1434  Weight: 74.4 kg (164 lb 0.4 oz)    Examination:  General exam: Appears calm and comfortable  Respiratory system: Clear to auscultation. Respiratory effort normal. Cardiovascular system: S1 & S2. No pedal edema. Gastrointestinal system: Abdomen is nondistended, soft and nontender. No organomegaly or masses felt. Normal bowel sounds heard. Central nervous system: Alert and oriented. No focal neurological deficits. Extremities: Symmetric 5 x 5 power. Psychiatry: Judgement and insight appear normal. Mood & affect appropriate.     Data Reviewed: I have personally reviewed following labs and imaging studies  CBC: Recent Labs  Lab 02/22/17 1217 02/22/17 1856 02/23/17 0452  WBC 19.4* 14.6* 13.0*  NEUTROABS 18.2*  --   --   HGB 10.5* 9.2* 9.0*  HCT 31.9* 28.6* 27.7*  MCV 77.4* 78.6 78.7  PLT 169 148* 315   Basic Metabolic Panel:  Recent Labs  Lab 02/22/17 1217 02/22/17 1856 02/23/17 0452  NA 133*  --  135  K 4.2  --  3.5  CL 98*  --  101  CO2 21*  --  22  GLUCOSE 232*  --  193*  BUN 27*  --  20  CREATININE 1.90* 1.81* 1.48*  CALCIUM 10.8*  --  10.1   GFR: Estimated Creatinine Clearance: 36.6 mL/min (A) (by C-G formula based on SCr of 1.48 mg/dL (H)). Liver Function Tests: Recent Labs  Lab 02/22/17 1217  AST 20  ALT 11*  ALKPHOS 73  BILITOT 1.7*  PROT 6.8  ALBUMIN 3.6   No results for input(s): LIPASE, AMYLASE in the last 168 hours. No results for input(s): AMMONIA in the last 168 hours. Coagulation Profile: No results for input(s): INR, PROTIME in the last 168 hours. Cardiac Enzymes: Recent Labs  Lab 02/22/17 1217 02/23/17 0452  CKTOTAL 415* 115   BNP (last 3 results) No results for input(s): PROBNP in the last 8760  hours. HbA1C: No results for input(s): HGBA1C in the last 72 hours. CBG: Recent Labs  Lab 02/22/17 2110 02/23/17 0753 02/23/17 1327  GLUCAP 241* 189* 175*   Lipid Profile: No results for input(s): CHOL, HDL, LDLCALC, TRIG, CHOLHDL, LDLDIRECT in the last 72 hours. Thyroid Function Tests: No results for input(s): TSH, T4TOTAL, FREET4, T3FREE, THYROIDAB in the last 72 hours. Anemia Panel: No results for input(s): VITAMINB12, FOLATE, FERRITIN, TIBC, IRON, RETICCTPCT in the last 72 hours. Urine analysis:    Component Value Date/Time   COLORURINE YELLOW 02/22/2017 1333   APPEARANCEUR HAZY (A) 02/22/2017 1333   APPEARANCEUR Cloudy (A) 11/12/2016 1101   LABSPEC 1.012 02/22/2017 1333   PHURINE 5.0 02/22/2017 1333   GLUCOSEU 50 (A) 02/22/2017 1333   HGBUR MODERATE (A) 02/22/2017 1333   BILIRUBINUR NEGATIVE 02/22/2017 1333   BILIRUBINUR Negative 11/12/2016 1101   KETONESUR 5 (A) 02/22/2017 1333   PROTEINUR 100 (A) 02/22/2017 1333   UROBILINOGEN 0.2 12/19/2013 1905   NITRITE NEGATIVE 02/22/2017 1333   LEUKOCYTESUR LARGE (A) 02/22/2017 1333   LEUKOCYTESUR 2+ (A) 11/12/2016 1101   Sepsis Labs: @LABRCNTIP (procalcitonin:4,lacticidven:4)  ) Recent Results (from the past 240 hour(s))  Culture, blood (Routine X 2) w Reflex to ID Panel     Status: None (Preliminary result)   Collection Time: 02/22/17  2:44 PM  Result Value Ref Range Status   Specimen Description RIGHT ANTECUBITAL  Final   Special Requests   Final    BOTTLES DRAWN AEROBIC AND ANAEROBIC Blood Culture adequate volume   Culture   Final    NO GROWTH < 24 HOURS Performed at [facility], [address], [place], [place] [address]    Report Status PENDING  Incomplete  Culture, blood (Routine X 2) w Reflex to ID Panel     Status: None (Preliminary result)   Collection Time: 02/22/17  2:46 PM  Result Value Ref Range Status   Specimen Description LEFT ANTECUBITAL  Final   Special Requests   Final    BOTTLES DRAWN AEROBIC  AND ANAEROBIC Blood Culture adequate volume   Culture   Final    NO GROWTH < 24 HOURS Performed at [facility], [address] [place], [place] [address]    Report Status PENDING  Incomplete         Radiology Studies: Dg Chest 1 View  Result Date: 02/22/2017 CLINICAL DATA:  Left hip pain status post fall. History of COPD and hypertension. EXAM: CHEST 1 VIEW  COMPARISON:  Radiographs 11/13/2016 and 11/17/2016. FINDINGS: 1129 hr. Stable cardiomegaly status post median sternotomy and CABG. There is aortic atherosclerosis. Persistent low lung volumes with vascular congestion. No overt pulmonary edema, confluent airspace opacity or significant pleural effusion. IMPRESSION: Cardiomegaly and vascular congestion. Electronically Signed   By: Richardean Sale M.D.   On: 02/22/2017 12:01   Dg Hip Unilat W Or Wo Pelvis 2-3 Views Left  Result Date: 02/22/2017 CLINICAL DATA:  Left hip pain after fall.  Initial encounter. EXAM: DG HIP (WITH OR WITHOUT PELVIS) 2-3V LEFT COMPARISON:  None. FINDINGS: Acute left femoral neck fracture with mild to moderate displacement. No evidence of pelvic ring fracture or diastasis. Osteopenia and atherosclerosis. IMPRESSION: Displaced left femoral neck fracture. Electronically Signed   By: Monte Fantasia M.D.   On: 02/22/2017 11:54        Scheduled Meds: . amLODipine  10 mg Oral Daily  . cloNIDine  0.1 mg Oral BID  . enoxaparin (LOVENOX) injection  30 mg Subcutaneous Q24H  . feeding supplement (GLUCERNA SHAKE)  237 mL Oral TID BM  . finasteride  5 mg Oral Daily  . FLUoxetine  10 mg Oral Daily  . insulin aspart  0-9 Units Subcutaneous TID WC  . metoprolol succinate  25 mg Oral Daily  . pantoprazole  40 mg Oral Daily  . tamsulosin  0.4 mg Oral Daily   Continuous Infusions: . cefTRIAXone (ROCEPHIN)  IV 1 g (02/22/17 1455)     LOS: 1 day    Time spent: 35 minutes    Dana Allan, MD  [facility] Pager #: [phone] 7PM-7AM contact night  coverage as above

## 2017-02-23 NOTE — Progress Notes (Signed)
Initial Nutrition Assessment  DOCUMENTATION CODES:   Not applicable  INTERVENTION:   -Glucerna Shake po TID, each supplement provides 220 kcal and 10 grams of protein  NUTRITION DIAGNOSIS:   Increased nutrient needs related to post-op healing as evidenced by estimated needs.  GOAL:   Patient will meet greater than or equal to 90% of their needs  MONITOR:   PO intake, Supplement acceptance, Diet advancement, Labs, Weight trends, Skin, I & O's  REASON FOR ASSESSMENT:   Consult Assessment of nutrition requirement/status, Hip fracture protocol  ASSESSMENT:   [name] is a 84 y.o. male with BPH, multivessel coronary artery disease, chronic back pain, type 2 diabetes mellitus, atrial fibrillation on Eliquis, hypertension, GERD and chronic kidney disease and other past medical history detailed below presented to the emergency department brought in by a friend who found him down at home on the floor  Pt admitted with lt femoral neck fx. Plan for lt hemiarthroplasty on 02/24/17 per orthopedics notes.   Spoke with pt and close friends at bedside. Pt friends report a general decline in health over the past 6 months, related to lt foot wound infected, lt BKA, prior hospitalization for UTI, and SNF stay. 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 Pt reports he typically consumes 3 meals per day and his lady friend assist with meal preparation. Pt's appetite has been very poor due to pain- pt reports he consumed only a few bites of scrambled eggs this morning. Documented meal completion 10-50%.   Pt pt, he has lost a lost of weight over the past 6 months. Pt unsure of UBW. Reviewed wt hx; UBW around 215#. Noted pt has experienced a 20.3% wt loss over the past 6 months, however, unsure of wt accuracy and  Suspect some wt loss may be partly related to amputations.   Pt very anxious  about upcoming surgery. Discussed importance of good meal and supplement intake to promote healing. Per pt friends, pt tolerates Glucerna supplements well.   Last Hgb A1c: 8.0 (11/14/16). PTA DM medications are 15 units insulin glargine q HS and 20 units insulin lispro daily.   Labs reviewed: CBGS: [id number] (inpatient orders for glycemic control are 0-9 units insulin aspart TID with meals).   NUTRITION - FOCUSED PHYSICAL EXAM:    Most Recent Value  Orbital Region  No depletion  Upper Arm Region  Mild depletion  Thoracic and Lumbar Region  No depletion  Buccal Region  No depletion  Temple Region  No depletion  Clavicle Bone Region  No depletion  Clavicle and Acromion Bone Region  No depletion  Scapular Bone Region  No depletion  Dorsal Hand  No depletion  Patellar Region  No depletion  Anterior Thigh Region  No depletion  Posterior Calf Region  No depletion  Edema (RD Assessment)  None  Hair  Reviewed  Eyes  Reviewed  Mouth  Reviewed  Skin  Reviewed  Nails  Reviewed       Diet Order:  Fall precautions Diet heart healthy/carb modified Room service appropriate? Yes; Fluid consistency: Thin  EDUCATION NEEDS:   Education needs have been addressed  Skin:  Skin Assessment: Reviewed RN Assessment  Last BM:  PTA  Height:   Ht Readings from Last 1 Encounters:  02/23/17 5\' 8"  (1.727 m)    Weight:   Wt Readings from Last 1 Encounters:  02/23/17 164  lb 0.4 oz (74.4 kg)    Ideal Body Weight:  65.5 kg  BMI:  Body mass index is 24.94 kg/m.  Estimated Nutritional Needs:   Kcal:  6712-4580  Protein:  85-100 grams  Fluid:  1.7-1.9 L    Hellon Vaccarella A. Jimmye Norman, RD, LDN, CDE Pager: [phone] After hours Pager: [phone]

## 2017-02-23 NOTE — Progress Notes (Signed)
   Subjective: * Surgery Date in Future * Procedure(s) (LRB): ARTHROPLASTY POSTERIOR (HEMIARTHROPLASTY) (Left) Patient reports pain as mild and moderate.    Objective: Vital signs in last 24 hours: Temp:  [98 F (36.7 C)-98.2 F (36.8 C)] 98 F (36.7 C) (02/19 0456) Pulse Rate:  [64-95] 86 (02/19 0456) Resp:  [16-24] 18 (02/19 0456) BP: (119-142)/(64-102) 136/68 (02/19 0456) SpO2:  [89 %-100 %] 100 % (02/19 0456)  Intake/Output from previous day: 02/18 0701 - 02/19 0700 In: 1220 [P.O.:220; IV Piggyback:1000] Out: 750 [Urine:750] Intake/Output this shift: No intake/output data recorded.  Recent Labs    02/22/17 1217 02/22/17 1856 02/23/17 0452  HGB 10.5* 9.2* 9.0*   Recent Labs    02/22/17 1856 02/23/17 0452  WBC 14.6* 13.0*  RBC 3.64* 3.52*  HCT 28.6* 27.7*  PLT 148* 152   Recent Labs    02/22/17 1217 02/22/17 1856 02/23/17 0452  NA 133*  --  135  K 4.2  --  3.5  CL 98*  --  101  CO2 21*  --  22  BUN 27*  --  20  CREATININE 1.90* 1.81* 1.48*  GLUCOSE 232*  --  193*  CALCIUM 10.8*  --  10.1   No results for input(s): LABPT, INR in the last 72 hours.  mild left LE swelling .  Dg Chest 1 View  Result Date: 02/22/2017 CLINICAL DATA:  Left hip pain status post fall. History of COPD and hypertension. EXAM: CHEST 1 VIEW COMPARISON:  Radiographs 11/13/2016 and 11/17/2016. FINDINGS: 1129 hr. Stable cardiomegaly status post median sternotomy and CABG. There is aortic atherosclerosis. Persistent low lung volumes with vascular congestion. No overt pulmonary edema, confluent airspace opacity or significant pleural effusion. IMPRESSION: Cardiomegaly and vascular congestion. Electronically Signed   By: Richardean Sale M.D.   On: 02/22/2017 12:01   Dg Hip Unilat W Or Wo Pelvis 2-3 Views Left  Result Date: 02/22/2017 CLINICAL DATA:  Left hip pain after fall.  Initial encounter. EXAM: DG HIP (WITH OR WITHOUT PELVIS) 2-3V LEFT COMPARISON:  None. FINDINGS: Acute left  femoral neck fracture with mild to moderate displacement. No evidence of pelvic ring fracture or diastasis. Osteopenia and atherosclerosis. IMPRESSION: Displaced left femoral neck fracture. Electronically Signed   By: Monte Fantasia M.D.   On: 02/22/2017 11:54    Assessment/Plan: * Surgery Date in Future * Procedure(s) (LRB): ARTHROPLASTY POSTERIOR (HEMIARTHROPLASTY) (Left) Plan:  Surgery Wed afternoon. Would recommend transfusion of at least one unit PRBC either today or tomorrow since he will loose additional blood at surgery, is on Lovenox now and will be starting eliquis after surgery again will continue to drop Hgb. Renal function and CPK improved. Will need to be NPO after MN tonight. Thank you.  Cell  [phone]  [name] 02/23/2017, 7:30 AM

## 2017-02-24 ENCOUNTER — Inpatient Hospital Stay (HOSPITAL_COMMUNITY): Payer: Medicare Other | Admitting: Anesthesiology

## 2017-02-24 ENCOUNTER — Encounter (HOSPITAL_COMMUNITY)
Admission: EM | Disposition: A | Discharge status: Skilled Nursing Facility | Payer: Self-pay | Source: Home / Self Care | Attending: Internal Medicine

## 2017-02-24 ENCOUNTER — Encounter (HOSPITAL_COMMUNITY): Payer: Self-pay | Admitting: Orthopedic Surgery

## 2017-02-24 DIAGNOSIS — N179 Acute kidney failure, unspecified: Secondary | ICD-10-CM

## 2017-02-24 DIAGNOSIS — N183 Chronic kidney disease, stage 3 (moderate): Secondary | ICD-10-CM

## 2017-02-24 HISTORY — PX: HIP ARTHROPLASTY: SHX981

## 2017-02-24 LAB — CBC
HCT: 29.4 % — ABNORMAL LOW (ref 39.0–52.0)
HEMOGLOBIN: 9.3 g/dL — AB (ref 13.0–17.0)
MCH: 25.1 pg — AB (ref 26.0–34.0)
MCHC: 31.6 g/dL (ref 30.0–36.0)
MCV: 79.5 fL (ref 78.0–100.0)
PLATELETS: 203 10*3/uL (ref 150–400)
RBC: 3.7 MIL/uL — AB (ref 4.22–5.81)
RDW: 17.2 % — ABNORMAL HIGH (ref 11.5–15.5)
WBC: 11 10*3/uL — ABNORMAL HIGH (ref 4.0–10.5)

## 2017-02-24 LAB — BASIC METABOLIC PANEL
Anion gap: 13 (ref 5–15)
BUN: 17 mg/dL (ref 6–20)
CHLORIDE: 99 mmol/L — AB (ref 101–111)
CO2: 23 mmol/L (ref 22–32)
CREATININE: 1.35 mg/dL — AB (ref 0.61–1.24)
Calcium: 10 mg/dL (ref 8.9–10.3)
GFR calc non Af Amer: 47 mL/min — ABNORMAL LOW (ref >60)
GFR, EST AFRICAN AMERICAN: 54 mL/min — AB (ref >60)
GLUCOSE: 199 mg/dL — AB (ref 65–99)
Potassium: 3.7 mmol/L (ref 3.5–5.1)
Sodium: 135 mmol/L (ref 135–145)

## 2017-02-24 LAB — GLUCOSE, CAPILLARY
GLUCOSE-CAPILLARY: 130 mg/dL — AB (ref 65–99)
Glucose-Capillary: 176 mg/dL — ABNORMAL HIGH (ref 65–99)
Glucose-Capillary: 156 mg/dL — ABNORMAL HIGH (ref 65–99)
Glucose-Capillary: 132 mg/dL — ABNORMAL HIGH (ref 65–99)
Glucose-Capillary: 181 mg/dL — ABNORMAL HIGH (ref 65–99)

## 2017-02-24 LAB — SURGICAL PCR SCREEN
MRSA, PCR: POSITIVE — AB
Staphylococcus aureus: POSITIVE — AB

## 2017-02-24 LAB — VITAMIN D 25 HYDROXY (VIT D DEFICIENCY, FRACTURES): Vit D, 25-Hydroxy: 47.2 ng/mL (ref 30.0–100.0)

## 2017-02-24 LAB — PREPARE RBC (CROSSMATCH)

## 2017-02-24 SURGERY — HEMIARTHROPLASTY, HIP, DIRECT ANTERIOR APPROACH, FOR FRACTURE
Anesthesia: General | Site: Hip | Laterality: Left

## 2017-02-24 MED ORDER — PHENYLEPHRINE HCL 10 MG/ML IJ SOLN
INTRAMUSCULAR | Status: DC | PRN
  Administered 2017-02-24: 30 ug/min via INTRAVENOUS

## 2017-02-24 MED ORDER — OXYCODONE HCL 5 MG/5ML PO SOLN: 5.0000 mg | Freq: Once | ORAL | Status: DC | PRN

## 2017-02-24 MED ORDER — SODIUM CHLORIDE 0.9 % IR SOLN
Status: DC | PRN
  Administered 2017-02-24: 3000 mL
  Administered 2017-02-24: 1000 mL

## 2017-02-24 MED ORDER — SODIUM CHLORIDE 0.9 % IV SOLN
INTRAVENOUS | Status: DC | PRN
  Administered 2017-02-24: 18:00:00 via INTRAVENOUS

## 2017-02-24 MED ORDER — DOCUSATE SODIUM 100 MG PO CAPS
100.0000 mg | ORAL_CAPSULE | Freq: Two times a day (BID) | ORAL | Status: DC
  Administered 2017-02-24 – 2017-02-27 (×6): 100 mg via ORAL
  Filled 2017-02-24 (×6): qty 1

## 2017-02-24 MED ORDER — OXYCODONE HCL 5 MG PO TABS: 5.0000 mg | ORAL_TABLET | Freq: Once | ORAL | Status: DC | PRN

## 2017-02-24 MED ORDER — PROPOFOL 10 MG/ML IV BOLUS
INTRAVENOUS | Status: DC | PRN
  Administered 2017-02-24: 80 mg via INTRAVENOUS

## 2017-02-24 MED ORDER — FENTANYL CITRATE (PF) 100 MCG/2ML IJ SOLN
INTRAMUSCULAR | Status: AC
  Filled 2017-02-24: qty 2

## 2017-02-24 MED ORDER — MENTHOL 3 MG MT LOZG: 1.0000 | LOZENGE | OROMUCOSAL | Status: DC | PRN

## 2017-02-24 MED ORDER — FENTANYL CITRATE (PF) 250 MCG/5ML IJ SOLN
INTRAMUSCULAR | Status: AC
  Filled 2017-02-24: qty 5

## 2017-02-24 MED ORDER — ONDANSETRON HCL 4 MG PO TABS: 4.0000 mg | ORAL_TABLET | Freq: Four times a day (QID) | ORAL | Status: DC | PRN

## 2017-02-24 MED ORDER — FENTANYL CITRATE (PF) 100 MCG/2ML IJ SOLN: 25.0000 ug | INTRAMUSCULAR | Status: DC | PRN

## 2017-02-24 MED ORDER — ACETAMINOPHEN 325 MG PO TABS
650.0000 mg | ORAL_TABLET | ORAL | Status: DC | PRN
  Administered 2017-02-27: 650 mg via ORAL
  Filled 2017-02-24: qty 2

## 2017-02-24 MED ORDER — ONDANSETRON HCL 4 MG/2ML IJ SOLN
INTRAMUSCULAR | Status: DC | PRN
  Administered 2017-02-24: 4 mg via INTRAVENOUS

## 2017-02-24 MED ORDER — ENOXAPARIN SODIUM 40 MG/0.4ML ~~LOC~~ SOLN
40.0000 mg | SUBCUTANEOUS | Status: DC
  Administered 2017-02-25: 40 mg via SUBCUTANEOUS
  Filled 2017-02-24: qty 0.4

## 2017-02-24 MED ORDER — HYDROCODONE-ACETAMINOPHEN 7.5-325 MG PO TABS
1.0000 | ORAL_TABLET | ORAL | Status: DC | PRN
  Administered 2017-02-24 – 2017-02-27 (×10): 1 via ORAL
  Filled 2017-02-24 (×10): qty 1

## 2017-02-24 MED ORDER — LIDOCAINE HCL (CARDIAC) 20 MG/ML IV SOLN
INTRAVENOUS | Status: DC | PRN
  Administered 2017-02-24: 100 mg via INTRAVENOUS

## 2017-02-24 MED ORDER — BUPIVACAINE HCL (PF) 0.25 % IJ SOLN
INTRAMUSCULAR | Status: AC
  Filled 2017-02-24: qty 30

## 2017-02-24 MED ORDER — CEFAZOLIN SODIUM-DEXTROSE 1-4 GM/50ML-% IV SOLN
1.0000 g | Freq: Three times a day (TID) | INTRAVENOUS | Status: AC
  Administered 2017-02-25 (×2): 1 g via INTRAVENOUS
  Filled 2017-02-24 (×2): qty 50

## 2017-02-24 MED ORDER — PHENYLEPHRINE 40 MCG/ML (10ML) SYRINGE FOR IV PUSH (FOR BLOOD PRESSURE SUPPORT)
PREFILLED_SYRINGE | INTRAVENOUS | Status: DC | PRN
  Administered 2017-02-24: 120 ug via INTRAVENOUS
  Administered 2017-02-24: 80 ug via INTRAVENOUS

## 2017-02-24 MED ORDER — ROCURONIUM BROMIDE 100 MG/10ML IV SOLN
INTRAVENOUS | Status: DC | PRN
  Administered 2017-02-24: 10 mg via INTRAVENOUS
  Administered 2017-02-24: 7.4 mg via INTRAVENOUS
  Administered 2017-02-24: 50 mg via INTRAVENOUS

## 2017-02-24 MED ORDER — ESMOLOL HCL 100 MG/10ML IV SOLN
INTRAVENOUS | Status: DC | PRN
  Administered 2017-02-24: 40 mg via INTRAVENOUS

## 2017-02-24 MED ORDER — MUPIROCIN 2 % EX OINT
1.0000 "application " | TOPICAL_OINTMENT | Freq: Two times a day (BID) | CUTANEOUS | Status: DC
  Administered 2017-02-24 – 2017-02-27 (×7): 1 via NASAL
  Filled 2017-02-24: qty 22

## 2017-02-24 MED ORDER — METOCLOPRAMIDE HCL 5 MG PO TABS
5.0000 mg | ORAL_TABLET | Freq: Three times a day (TID) | ORAL | Status: DC | PRN
Start: 2017-02-24 — End: 2017-02-25

## 2017-02-24 MED ORDER — FENTANYL CITRATE (PF) 100 MCG/2ML IJ SOLN
50.0000 ug | Freq: Once | INTRAMUSCULAR | Status: AC
  Administered 2017-02-24: 50 ug via INTRAVENOUS

## 2017-02-24 MED ORDER — FENTANYL CITRATE (PF) 100 MCG/2ML IJ SOLN
INTRAMUSCULAR | Status: DC | PRN
  Administered 2017-02-24: 25 ug via INTRAVENOUS
  Administered 2017-02-24 (×3): 50 ug via INTRAVENOUS

## 2017-02-24 MED ORDER — CEFAZOLIN SODIUM-DEXTROSE 2-3 GM-%(50ML) IV SOLR
INTRAVENOUS | Status: DC | PRN
  Administered 2017-02-24: 2 g via INTRAVENOUS

## 2017-02-24 MED ORDER — CHLORHEXIDINE GLUCONATE CLOTH 2 % EX PADS
6.0000 | MEDICATED_PAD | Freq: Every day | CUTANEOUS | Status: DC
  Administered 2017-02-24 – 2017-02-27 (×3): 6 via TOPICAL

## 2017-02-24 MED ORDER — ACETAMINOPHEN 650 MG RE SUPP: 650.0000 mg | RECTAL | Status: DC | PRN

## 2017-02-24 MED ORDER — BUPIVACAINE HCL (PF) 0.25 % IJ SOLN
INTRAMUSCULAR | Status: DC | PRN
  Administered 2017-02-24: 10 mL

## 2017-02-24 MED ORDER — SODIUM CHLORIDE 0.9 % IV SOLN: 10.0000 mL/h | Freq: Once | INTRAVENOUS | Status: DC

## 2017-02-24 MED ORDER — PROPOFOL 10 MG/ML IV BOLUS
INTRAVENOUS | Status: AC
  Filled 2017-02-24: qty 20

## 2017-02-24 MED ORDER — ONDANSETRON HCL 4 MG/2ML IJ SOLN
4.0000 mg | Freq: Four times a day (QID) | INTRAMUSCULAR | Status: DC | PRN
  Administered 2017-02-25 (×2): 4 mg via INTRAVENOUS
  Filled 2017-02-24 (×2): qty 2

## 2017-02-24 MED ORDER — PHENOL 1.4 % MT LIQD: 1.0000 | OROMUCOSAL | Status: DC | PRN

## 2017-02-24 MED ORDER — SUGAMMADEX SODIUM 200 MG/2ML IV SOLN
INTRAVENOUS | Status: DC | PRN
  Administered 2017-02-24: 15 mg via INTRAVENOUS

## 2017-02-24 MED ORDER — METOCLOPRAMIDE HCL 5 MG/ML IJ SOLN
5.0000 mg | Freq: Three times a day (TID) | INTRAMUSCULAR | Status: DC | PRN
Start: 2017-02-24 — End: 2017-02-25

## 2017-02-24 MED ORDER — LACTATED RINGERS IV SOLN
INTRAVENOUS | Status: DC
  Administered 2017-02-24: 16:00:00 via INTRAVENOUS

## 2017-02-24 SURGICAL SUPPLY — 68 items
BENZOIN TINCTURE PRP APPL 2/3 (GAUZE/BANDAGES/DRESSINGS) ×3 IMPLANT
BLADE CLIPPER SURG (BLADE) IMPLANT
BLADE SAW SAG 73X25 THK (BLADE) ×1
BLADE SAW SGTL 73X25 THK (BLADE) ×2 IMPLANT
BRUSH FEMORAL CANAL (MISCELLANEOUS) IMPLANT
CAPT HIP HEMI 1 ×3 IMPLANT
CLOSURE STERI-STRIP 1/2X4 (GAUZE/BANDAGES/DRESSINGS) ×2
CLOSURE WOUND 1/2 X4 (GAUZE/BANDAGES/DRESSINGS)
CLSR STERI-STRIP ANTIMIC 1/2X4 (GAUZE/BANDAGES/DRESSINGS) ×4 IMPLANT
COVER BACK TABLE 24X17X13 BIG (DRAPES) IMPLANT
DRAPE IMP U-DRAPE 54X76 (DRAPES) ×3 IMPLANT
DRAPE INCISE IOBAN 66X45 STRL (DRAPES) ×3 IMPLANT
DRAPE ORTHO SPLIT 77X108 STRL (DRAPES) ×4
DRAPE SURG ORHT 6 SPLT 77X108 (DRAPES) ×2 IMPLANT
DRAPE U-SHAPE 47X51 STRL (DRAPES) ×3 IMPLANT
DRESSING ALLEVYN LIFE SACRUM (GAUZE/BANDAGES/DRESSINGS) ×3 IMPLANT
DRSG MEPILEX BORDER 4X12 (GAUZE/BANDAGES/DRESSINGS) ×3 IMPLANT
DRSG MEPILEX BORDER 4X8 (GAUZE/BANDAGES/DRESSINGS) IMPLANT
DRSG PAD ABDOMINAL 8X10 ST (GAUZE/BANDAGES/DRESSINGS) IMPLANT
DURAPREP 26ML APPLICATOR (WOUND CARE) ×3 IMPLANT
ELECT BLADE 4.0 EZ CLEAN MEGAD (MISCELLANEOUS) ×3
ELECT CAUTERY BLADE 6.4 (BLADE) ×3 IMPLANT
ELECT REM PT RETURN 9FT ADLT (ELECTROSURGICAL) ×3
ELECTRODE BLDE 4.0 EZ CLN MEGD (MISCELLANEOUS) ×1 IMPLANT
ELECTRODE REM PT RTRN 9FT ADLT (ELECTROSURGICAL) ×1 IMPLANT
EVACUATOR 1/8 PVC DRAIN (DRAIN) IMPLANT
FACESHIELD WRAPAROUND (MASK) ×6 IMPLANT
GAUZE SPONGE 4X4 12PLY STRL (GAUZE/BANDAGES/DRESSINGS) ×3 IMPLANT
GAUZE XEROFORM 5X9 LF (GAUZE/BANDAGES/DRESSINGS) ×3 IMPLANT
GLOVE BIOGEL PI IND STRL 8 (GLOVE) ×1 IMPLANT
GLOVE BIOGEL PI INDICATOR 8 (GLOVE) ×2
GLOVE ORTHO TXT STRL SZ7.5 (GLOVE) ×3 IMPLANT
GOWN STRL REUS W/ TWL LRG LVL3 (GOWN DISPOSABLE) ×1 IMPLANT
GOWN STRL REUS W/ TWL XL LVL3 (GOWN DISPOSABLE) ×1 IMPLANT
GOWN STRL REUS W/TWL 2XL LVL3 (GOWN DISPOSABLE) ×3 IMPLANT
GOWN STRL REUS W/TWL LRG LVL3 (GOWN DISPOSABLE) ×2
GOWN STRL REUS W/TWL XL LVL3 (GOWN DISPOSABLE) ×2
HANDPIECE INTERPULSE COAX TIP (DISPOSABLE) ×2
IMMOBILIZER KNEE 22 UNIV (SOFTGOODS) IMPLANT
KIT BASIN OR (CUSTOM PROCEDURE TRAY) ×3 IMPLANT
KIT ROOM TURNOVER OR (KITS) ×3 IMPLANT
MANIFOLD NEPTUNE II (INSTRUMENTS) ×3 IMPLANT
NDL SUT 2 .5 CRC MAYO 1.732X (NEEDLE) ×1 IMPLANT
NEEDLE HYPO 25GX1X1/2 BEV (NEEDLE) ×3 IMPLANT
NEEDLE MAYO TAPER (NEEDLE) ×2
NS IRRIG 1000ML POUR BTL (IV SOLUTION) ×3 IMPLANT
PACK TOTAL JOINT (CUSTOM PROCEDURE TRAY) ×3 IMPLANT
PACK UNIVERSAL I (CUSTOM PROCEDURE TRAY) ×3 IMPLANT
PAD ABD 8X10 STRL (GAUZE/BANDAGES/DRESSINGS) ×6 IMPLANT
PAD ARMBOARD 7.5X6 YLW CONV (MISCELLANEOUS) ×3 IMPLANT
PIN STEINMAN 3/16 (PIN) IMPLANT
SET HNDPC FAN SPRY TIP SCT (DISPOSABLE) ×1 IMPLANT
SPONGE LAP 4X18 X RAY DECT (DISPOSABLE) IMPLANT
STAPLER VISISTAT 35W (STAPLE) ×3 IMPLANT
STRIP CLOSURE SKIN 1/2X4 (GAUZE/BANDAGES/DRESSINGS) IMPLANT
SUCTION FRAZIER HANDLE 10FR (MISCELLANEOUS)
SUCTION TUBE FRAZIER 10FR DISP (MISCELLANEOUS) IMPLANT
SUT ETHIBOND NAB CT1 #1 30IN (SUTURE) ×9 IMPLANT
SUT TICRON (SUTURE) ×6 IMPLANT
SUT VIC AB 2-0 CT1 27 (SUTURE) ×6
SUT VIC AB 2-0 CT1 TAPERPNT 27 (SUTURE) ×3 IMPLANT
SUT VICRYL 0 TIES 12 18 (SUTURE) ×3 IMPLANT
SYR CONTROL 10ML LL (SYRINGE) ×3 IMPLANT
TOWEL OR 17X24 6PK STRL BLUE (TOWEL DISPOSABLE) ×3 IMPLANT
TOWEL OR 17X26 10 PK STRL BLUE (TOWEL DISPOSABLE) ×3 IMPLANT
TOWER CARTRIDGE SMART MIX (DISPOSABLE) IMPLANT
TRAY FOLEY W/METER SILVER 16FR (SET/KITS/TRAYS/PACK) IMPLANT
WATER STERILE IRR 1000ML POUR (IV SOLUTION) IMPLANT

## 2017-02-24 NOTE — Transfer of Care (Signed)
Immediate Anesthesia Transfer of Care Note  Patient: [name]  Procedure(s) Performed: ARTHROPLASTY POSTERIOR (HEMIARTHROPLASTY) (Left Hip)  Patient Location: PACU  Anesthesia Type:General  Level of Consciousness: awake, alert  and oriented  Airway & Oxygen Therapy: Patient Spontanous Breathing and Patient connected to nasal cannula oxygen  Post-op Assessment: Report given to RN and Post -op Vital signs reviewed and stable  Post vital signs: Reviewed and stable  Last Vitals:  Vitals:   02/24/17 0438 02/24/17 1847  BP: (!) 142/71   Pulse: 83   Resp: 20   Temp: 36.7 C 36.5 C  SpO2: 99%     Last Pain:  Vitals:   02/24/17 1239  TempSrc:   PainSc: 6          Complications: No apparent anesthesia complications

## 2017-02-24 NOTE — Progress Notes (Addendum)
PROGRESS NOTE    [name]  YQM:[id number] DOB: [birth year] DOA: 02/22/2017 PCP: [name], MD  Outpatient Specialists:   Brief Narrative: Patient is an 84 year old Caucasian male with past medical history significant for BPH, multivessel coronary artery disease, chronic back pain, type 2 diabetes mellitus, atrial fibrillation on Eliquis, hypertension, GERD and chronic kidney disease.  The patient is recently status post having a left below-knee amputation on 10/07/16 by Dr. Lorin Mercy.  The patient had received a prosthesis 4 days prior to presentation and he started using it without the assistance of physical therapy.  Patient fell and hurt his left hip and has had a difficult time being able to bear any weight since that time.  Patient was brought to the emergency room by a friend who found him down at home on the floor.  He had been on the floor for the entire night.  He was unable to get up to get to a telephone or get any help.  Patient has been admitted with left hip fracture.  Orthopedic team is involved.  Surgery is planned, 2/20.  Assessment & Plan:   Active Problems:   Essential hypertension   History of DVT (deep vein thrombosis)   Leukocytosis   Chronic back pain   Chronic diastolic CHF (congestive heart failure) (HCC)   Acute renal failure superimposed on stage 3 chronic kidney disease (HCC)   Atrial fibrillation with normal ventricular rate (HCC)   Pressure injury of skin   Type 2 diabetes mellitus (HCC)   COPD (chronic obstructive pulmonary disease) (HCC)   CAD (coronary artery disease)   UTI (urinary tract infection)   Closed left hip fracture (HCC)   GERD (gastroesophageal reflux disease)   CKD (chronic kidney disease), stage III (HCC)   Hyperglycemia   Anemia in chronic kidney disease (CKD)   Hypercalcemia   Moderate dehydration   Fall as cause of accidental injury at home as place of occurrence   S/P BKA (below knee amputation) unilateral, left (HCC)  Bilateral hip pain   Pyuria   Hyperbilirubinemia    Acute left femoral neck fracture -Orthopedics consulted and plan surgery 2/20.  Eliquis on hold.  Appears to be in OR now.  Fall at home: -the patient was down for multiple hours on the floor.   -CPK today was 115.  No significant rhabdomyolysis.  PT evaluation postop.  Moderate dehydration: - Resolved after IV fluids.  Anemia and chronic kidney disease: -type and screen has been ordered.  Follow hemoglobin closely.  He likely is going to need postop transfusion.  Follow CBC. -Hemoglobin stable in the 9 g range for the last 3 days.  Follow CBC in a.m. and transfuse if hemoglobin <7 g per DL.  Leukocytosis: -?  Related to stress of fracture.  Resolved.  Urinary tract infection: -patient had a Foley catheter removed in January/2018.  He has market pyuria and has been started on empiric ceftriaxone IV.  Blood cultures-see discussion below.  Urine culture does not appear to have been sent..    1 of 4 blood cultures positive for gram-positive rods - BCID did not detect anything.  As per discussion with pharmacy, likely contaminant.  Follow final results.  Type II DM with hyperglycemia: -A1c 8 on 11/14/16.  Continue SSI.  Mildly uncontrolled and fluctuating.  Hypercalcemia/hyperbilirubinemia: -would like to repeat these labs after adequate IV fluid hydration. -Calcium is down to 10.1.  Atrial fibrillation: -heart rate is currently controlled.  He has been anticoagulated with Eliquis.  Holding Eliquis at this time pending surgery.  Resume Eliquis when okay with orthopedics.  Coronary artery disease: -stable no symptoms of chest pain.  No EKG findings to suggest ischemia.  Essential hypertension: -resume home blood pressure medications.  With controlled.  COPD: -Stable.  Chronic diastolic congestive heart failure: -Compensated at this time.  GERD: - Protonix ordered for GI protection.  Chronic back  pain: -Controlled.  Acute on chronic kidney disease stage III: - IV fluid hydration has been ordered.  Renally dose medications as appropriate.  Follow BMP. -Serum creatinine is down to 1.35.   DVT Prophylaxis: Lovenox until eliquis is restarted Code Status: Partial Family Communication:  None at bedside. Disposition Plan: Likely will need SNF placement after hip replacement surgery    Consultants:   Orthopedic team.  Procedures:   Surgery is planned for 02/24/2017.  Antimicrobials:   IV Rocephin.   Subjective: Seen this morning prior to procedure.  Appropriate left hip pain.  No chest pain, dyspnea, palpitations or dizziness reported.  Objective: Vitals:   02/23/17 1434 02/23/17 2010 02/24/17 0438 02/24/17 1604  BP:  (!) 142/68 (!) 142/71   Pulse:  68 83   Resp:  18 20   Temp:  98.5 F (36.9 C) 98.1 F (36.7 C)   TempSrc:  Oral Oral   SpO2:  100% 99%   Weight: 74.4 kg (164 lb 0.4 oz)   74.4 kg (164 lb 0.4 oz)  Height: 5\' 8"  (1.727 m)   5' 7.99" (1.727 m)    Intake/Output Summary (Last 24 hours) at 02/24/2017 1724 Last data filed at 02/24/2017 9509 Gross per 24 hour  Intake 1240 ml  Output 900 ml  Net 340 ml   Filed Weights   02/23/17 1434 02/24/17 1604  Weight: 74.4 kg (164 lb 0.4 oz) 74.4 kg (164 lb 0.4 oz)    Examination:  General exam: Pleasant elderly male, moderately built and nourished lying comfortably supine in bed. Respiratory system: Clear to auscultation. Respiratory effort normal.  Sternotomy scar.  Stable without change. Cardiovascular system: S1-S2 heard, RRR.  No JVD, murmurs or pedal edema. Gastrointestinal system: Abdomen is nondistended, soft and nontender. No organomegaly or masses felt. Normal bowel sounds heard.  Stable without change. Central nervous system: Alert and oriented. No focal neurological deficits. Extremities: Symmetric 5 x 5 power.  Left BKA. Psychiatry: Judgement and insight appear normal. Mood & affect appropriate.      Data Reviewed: I have personally reviewed following labs and imaging studies  CBC: Recent Labs  Lab 02/22/17 1217 02/22/17 1856 02/23/17 0452 02/24/17 0810  WBC 19.4* 14.6* 13.0* 11.0*  NEUTROABS 18.2*  --   --   --   HGB 10.5* 9.2* 9.0* 9.3*  HCT 31.9* 28.6* 27.7* 29.4*  MCV 77.4* 78.6 78.7 79.5  PLT 169 148* 152 326   Basic Metabolic Panel: Recent Labs  Lab 02/22/17 1217 02/22/17 1856 02/23/17 0452 02/24/17 0810  NA 133*  --  135 135  K 4.2  --  3.5 3.7  CL 98*  --  101 99*  CO2 21*  --  22 23  GLUCOSE 232*  --  193* 199*  BUN 27*  --  20 17  CREATININE 1.90* 1.81* 1.48* 1.35*  CALCIUM 10.8*  --  10.1 10.0   GFR: Estimated Creatinine Clearance: 40.1 mL/min (A) (by C-G formula based on SCr of 1.35 mg/dL (H)). Liver Function Tests: Recent Labs  Lab 02/22/17 1217  AST 20  ALT 11*  ALKPHOS 73  BILITOT 1.7*  PROT 6.8  ALBUMIN 3.6   Cardiac Enzymes: Recent Labs  Lab 02/22/17 1217 02/23/17 0452  CKTOTAL 415* 115   CBG: Recent Labs  Lab 02/23/17 1633 02/23/17 2108 02/24/17 0813 02/24/17 1212 02/24/17 1542  GLUCAP 149* 106* 176* 156* 132*   Urine analysis:    Component Value Date/Time   COLORURINE YELLOW 02/22/2017 1333   APPEARANCEUR HAZY (A) 02/22/2017 1333   APPEARANCEUR Cloudy (A) 11/12/2016 1101   LABSPEC 1.012 02/22/2017 1333   PHURINE 5.0 02/22/2017 1333   GLUCOSEU 50 (A) 02/22/2017 1333   HGBUR MODERATE (A) 02/22/2017 1333   BILIRUBINUR NEGATIVE 02/22/2017 1333   BILIRUBINUR Negative 11/12/2016 1101   KETONESUR 5 (A) 02/22/2017 1333   PROTEINUR 100 (A) 02/22/2017 1333   UROBILINOGEN 0.2 12/19/2013 1905   NITRITE NEGATIVE 02/22/2017 1333   LEUKOCYTESUR LARGE (A) 02/22/2017 1333   LEUKOCYTESUR 2+ (A) 11/12/2016 1101    Recent Results (from the past 240 hour(s))  Culture, blood (Routine X 2) w Reflex to ID Panel     Status: None (Preliminary result)   Collection Time: 02/22/17  2:44 PM  Result Value Ref Range Status    Specimen Description   Final    RIGHT ANTECUBITAL Performed at [facility], [address] [place], [place] [address]    Special Requests   Final    BOTTLES DRAWN AEROBIC AND ANAEROBIC Blood Culture adequate volume Performed at [facility], [address] [place], [place] [address]    Culture  Setup Time   Final    GRAM POSITIVE RODS AEROBIC BOTTLE ONLY Gram Stain Report Called to,Read Back By and Verified With: Marya Landry, RN @ [facility] @ 0406 ON 2.20.19 BY L.BOWMAN CRITICAL RESULT CALLED TO, READ BACK BY AND VERIFIED WITH: Josefine Class [id number] MLM Performed at [facility], [place] [address] [place], [place] [address]    Culture GRAM POSITIVE RODS  Final   Report Status PENDING  Incomplete  Culture, blood (Routine X 2) w Reflex to ID Panel     Status: None (Preliminary result)   Collection Time: 02/22/17  2:46 PM  Result Value Ref Range Status   Specimen Description LEFT ANTECUBITAL  Final   Special Requests   Final    BOTTLES DRAWN AEROBIC AND ANAEROBIC Blood Culture adequate volume   Culture   Final    NO GROWTH 2 DAYS Performed at [facility], [address] [place], [place] [address]    Report Status PENDING  Incomplete  Surgical pcr screen     Status: Abnormal   Collection Time: 02/24/17  4:48 AM  Result Value Ref Range Status   MRSA, PCR POSITIVE (A) NEGATIVE Final    Comment: RESULT CALLED TO, READ BACK BY AND VERIFIED WITH: C BULIAA RN 02/24/17 0649 JDW    Staphylococcus aureus POSITIVE (A) NEGATIVE Final    Comment: RESULT CALLED TO, READ BACK BY AND VERIFIED WITH: COren Binet RN 02/24/17 [phone] JDW (NOTE) The Xpert SA Assay (FDA approved for NASAL specimens in patients 4 years of age and older), is one component of a comprehensive surveillance program. It is not intended to diagnose infection nor to guide or monitor treatment. Performed at [facility], [place] [address], [place], [place] [address]          Radiology Studies: No results  found.      Scheduled Meds: . [MAR Hold] amLODipine  10 mg Oral Daily  . [MAR Hold] Chlorhexidine Gluconate Cloth  6 each Topical Q0600  . [MAR Hold] cloNIDine  0.1 mg Oral BID  . [MAR Hold] enoxaparin (LOVENOX) injection  30 mg Subcutaneous Q24H  . [MAR Hold] feeding supplement (GLUCERNA SHAKE)  237 mL Oral TID BM  . fentaNYL      . [MAR Hold] finasteride  5 mg Oral Daily  . [MAR Hold] FLUoxetine  10 mg Oral Daily  . [MAR Hold] insulin aspart  0-9 Units Subcutaneous TID WC  . [MAR Hold] metoprolol succinate  25 mg Oral Daily  . [MAR Hold] mupirocin ointment  1 application Nasal BID  . [MAR Hold] pantoprazole  40 mg Oral Daily  . [MAR Hold] tamsulosin  0.4 mg Oral Daily   Continuous Infusions: . [MAR Hold] cefTRIAXone (ROCEPHIN)  IV Stopped (02/24/17 1547)  . lactated ringers 10 mL/hr at 02/24/17 1609     LOS: 2 days    Time spent: 35 minutes  Vernell Leep, MD, [place], [facility]. [facility] Pager [phone]  If 7PM-7AM, please contact night-coverage [email or web address] Password [facility] 02/24/2017, 5:36 PM

## 2017-02-24 NOTE — Anesthesia Procedure Notes (Signed)
Procedure Name: Intubation Date/Time: 02/24/2017 5:27 PM Performed by: Imagene Riches, CRNA Pre-anesthesia Checklist: Patient identified, Emergency Drugs available, Suction available and Patient being monitored Patient Re-evaluated:Patient Re-evaluated prior to induction Oxygen Delivery Method: Circle System Utilized Preoxygenation: Pre-oxygenation with 100% oxygen Induction Type: IV induction Ventilation: Mask ventilation without difficulty Laryngoscope Size: Miller and 3 Grade View: Grade I Tube type: Oral Tube size: 7.5 mm Number of attempts: 1 Airway Equipment and Method: Stylet and Oral airway Placement Confirmation: ETT inserted through vocal cords under direct vision,  positive ETCO2 and breath sounds checked- equal and bilateral Secured at: 22 cm Tube secured with: Tape Dental Injury: Teeth and Oropharynx as per pre-operative assessment

## 2017-02-24 NOTE — Anesthesia Postprocedure Evaluation (Signed)
Anesthesia Post Note  Patient: [name]  Procedure(s) Performed: ARTHROPLASTY POSTERIOR (HEMIARTHROPLASTY) (Left Hip)     Patient location during evaluation: PACU Anesthesia Type: General Level of consciousness: awake and alert Pain management: pain level controlled Vital Signs Assessment: post-procedure vital signs reviewed and stable Respiratory status: spontaneous breathing, nonlabored ventilation, respiratory function stable and patient connected to nasal cannula oxygen Cardiovascular status: blood pressure returned to baseline and stable Postop Assessment: no apparent nausea or vomiting Anesthetic complications: no    Last Vitals:  Vitals:   02/24/17 1935 02/24/17 1950  BP:  130/70  Pulse:  80  Resp:  17  Temp: (!) 36.3 C (!) 36.4 C  SpO2:  95%    Last Pain:  Vitals:   02/24/17 1950  TempSrc: Oral  PainSc:                  Ladd Cen

## 2017-02-24 NOTE — Progress Notes (Signed)
Received  Call from [facility] lab-- + blood culture, Aerobic bottle gram + rods. Notified Tylene Fantasia via text page.

## 2017-02-24 NOTE — Progress Notes (Signed)
Orthopedic Tech Progress Note Patient Details:  [name] 01-05-1932 [id number]  Patient ID: [name], male   DOB: 1931/11/24, 84 y.o.   MRN: [id number] Pt cant have ohf due to age restrictions  Karolee Stamps 02/24/2017, 11:19 PM

## 2017-02-24 NOTE — Progress Notes (Signed)
PHARMACY - PHYSICIAN COMMUNICATION CRITICAL VALUE ALERT - BLOOD CULTURE IDENTIFICATION (BCID)  [name] is an 84 y.o. male who presented to [facility] on 02/22/2017 with a chief complaint of fall.  Assessment:   84 yo M presents after fall. Has femoral neck fracture. Diagnosed with UTI and on day #3 of Rocephin. No urine cx. Blood cx is 1/4 with GPR. BCID did not detect anything. Probable contaminant.  Name of physician (or Provider) Contacted: A. Hongalgi  Current antibiotics: Ceftriaxone  Changes to prescribed antibiotics recommended:  No change to current abx needed at this time Consider short 5-7 day course for possible UTI  No results found for this or any previous visit.  [name] 02/24/2017  8:49 AM

## 2017-02-24 NOTE — Care Management Note (Signed)
Case Management Note  Patient Details  Name: [name] MRN: [id number] Date of Birth: February 28, 1931  Subjective/Objective:                    Action/Plan: Await post op PT eval  Expected Discharge Date:                  Expected Discharge Plan:     In-House Referral:     Discharge planning Services  CM Consult  Post Acute Care Choice:  Home Health, Durable Medical Equipment Choice offered to:     DME Arranged:    DME Agency:     HH Arranged:    [place] Agency:     Status of Service:  In process, will continue to follow  If discussed at Long Length of Stay Meetings, dates discussed:    Additional Comments:  Marilu Favre, RN 02/24/2017, 2:55 PM

## 2017-02-24 NOTE — Interval H&P Note (Signed)
History and Physical Interval Note:  02/24/2017 5:09 PM  [name]  has presented today for surgery, with the diagnosis of HIP FRACTURE  The various methods of treatment have been discussed with the patient and family. After consideration of risks, benefits and other options for treatment, the patient has consented to  Procedure(s): ARTHROPLASTY POSTERIOR (HEMIARTHROPLASTY) (Left) as a surgical intervention .  The patient's history has been reviewed, patient examined, no change in status, stable for surgery.  I have reviewed the patient's chart and labs.  Questions were answered to the patient's satisfaction.     [name]

## 2017-02-25 ENCOUNTER — Encounter (HOSPITAL_COMMUNITY): Payer: Self-pay | Admitting: Orthopaedic Surgery

## 2017-02-25 DIAGNOSIS — I4891 Unspecified atrial fibrillation: Secondary | ICD-10-CM

## 2017-02-25 DIAGNOSIS — I5032 Chronic diastolic (congestive) heart failure: Secondary | ICD-10-CM

## 2017-02-25 LAB — BLOOD CULTURE ID PANEL (REFLEXED)
Acinetobacter baumannii: NOT DETECTED
CANDIDA GLABRATA: NOT DETECTED
CANDIDA KRUSEI: NOT DETECTED
CANDIDA TROPICALIS: NOT DETECTED
Candida albicans: NOT DETECTED
Candida parapsilosis: NOT DETECTED
ENTEROBACTER CLOACAE COMPLEX: NOT DETECTED
Enterobacteriaceae species: NOT DETECTED
Enterococcus species: NOT DETECTED
Escherichia coli: NOT DETECTED
Haemophilus influenzae: NOT DETECTED
Klebsiella oxytoca: NOT DETECTED
Klebsiella pneumoniae: NOT DETECTED
Listeria monocytogenes: NOT DETECTED
NEISSERIA MENINGITIDIS: NOT DETECTED
PROTEUS SPECIES: NOT DETECTED
Pseudomonas aeruginosa: NOT DETECTED
SERRATIA MARCESCENS: NOT DETECTED
STREPTOCOCCUS AGALACTIAE: NOT DETECTED
Staphylococcus aureus (BCID): NOT DETECTED
Staphylococcus species: NOT DETECTED
Streptococcus pneumoniae: NOT DETECTED
Streptococcus pyogenes: NOT DETECTED
Streptococcus species: NOT DETECTED

## 2017-02-25 LAB — CBC
HEMATOCRIT: 32 % — AB (ref 39.0–52.0)
HEMOGLOBIN: 10.2 g/dL — AB (ref 13.0–17.0)
MCH: 25.6 pg — ABNORMAL LOW (ref 26.0–34.0)
MCHC: 31.9 g/dL (ref 30.0–36.0)
MCV: 80.2 fL (ref 78.0–100.0)
Platelets: 180 10*3/uL (ref 150–400)
RBC: 3.99 MIL/uL — ABNORMAL LOW (ref 4.22–5.81)
RDW: 16.4 % — AB (ref 11.5–15.5)
WBC: 8.7 10*3/uL (ref 4.0–10.5)

## 2017-02-25 LAB — BASIC METABOLIC PANEL
Anion gap: 13 (ref 5–15)
BUN: 25 mg/dL — AB (ref 6–20)
CHLORIDE: 99 mmol/L — AB (ref 101–111)
CO2: 21 mmol/L — AB (ref 22–32)
CREATININE: 1.39 mg/dL — AB (ref 0.61–1.24)
Calcium: 9.7 mg/dL (ref 8.9–10.3)
GFR calc non Af Amer: 45 mL/min — ABNORMAL LOW (ref >60)
GFR, EST AFRICAN AMERICAN: 52 mL/min — AB (ref >60)
Glucose, Bld: 289 mg/dL — ABNORMAL HIGH (ref 65–99)
Potassium: 4.6 mmol/L (ref 3.5–5.1)
Sodium: 133 mmol/L — ABNORMAL LOW (ref 135–145)

## 2017-02-25 LAB — CULTURE, BLOOD (ROUTINE X 2): Special Requests: ADEQUATE

## 2017-02-25 LAB — GLUCOSE, CAPILLARY
GLUCOSE-CAPILLARY: 263 mg/dL — AB (ref 65–99)
GLUCOSE-CAPILLARY: 268 mg/dL — AB (ref 65–99)
GLUCOSE-CAPILLARY: 269 mg/dL — AB (ref 65–99)
GLUCOSE-CAPILLARY: 193 mg/dL — AB (ref 65–99)

## 2017-02-25 MED ORDER — INSULIN ASPART 100 UNIT/ML ~~LOC~~ SOLN
0.0000 [IU] | Freq: Every day | SUBCUTANEOUS | Status: DC
  Administered 2017-02-26 (×2): 2 [IU] via SUBCUTANEOUS

## 2017-02-25 MED ORDER — INSULIN ASPART 100 UNIT/ML ~~LOC~~ SOLN
0.0000 [IU] | Freq: Three times a day (TID) | SUBCUTANEOUS | Status: DC
  Administered 2017-02-25 – 2017-02-26 (×2): 5 [IU] via SUBCUTANEOUS
  Administered 2017-02-26: 3 [IU] via SUBCUTANEOUS
  Administered 2017-02-27 (×2): 2 [IU] via SUBCUTANEOUS

## 2017-02-25 MED ORDER — APIXABAN 5 MG PO TABS
5.0000 mg | ORAL_TABLET | Freq: Two times a day (BID) | ORAL | Status: DC
  Administered 2017-02-26 – 2017-02-27 (×3): 5 mg via ORAL
  Filled 2017-02-25 (×3): qty 1

## 2017-02-25 MED ORDER — INSULIN GLARGINE 100 UNIT/ML ~~LOC~~ SOLN
5.0000 [IU] | Freq: Every day | SUBCUTANEOUS | Status: DC
  Administered 2017-02-25: 5 [IU] via SUBCUTANEOUS
  Filled 2017-02-25 (×2): qty 0.05

## 2017-02-25 NOTE — Progress Notes (Signed)
   Subjective: 1 Day Post-Op Procedure(s) (LRB): ARTHROPLASTY POSTERIOR (HEMIARTHROPLASTY) (Left) Patient reports pain as mild and moderate.    Objective: Vital signs in last 24 hours: Temp:  [97.3 F (36.3 C)-98.2 F (36.8 C)] 97.3 F (36.3 C) (02/21 0700) Pulse Rate:  [59-89] 70 (02/21 0700) Resp:  [12-18] 16 (02/21 0700) BP: (119-142)/(64-85) 136/77 (02/21 0700) SpO2:  [92 %-98 %] 97 % (02/21 0700) Weight:  [164 lb 0.4 oz (74.4 kg)] 164 lb 0.4 oz (74.4 kg) (02/20 1604)  Intake/Output from previous day: 02/20 0701 - 02/21 0700 In: 1415 [I.V.:1000; Blood:315; IV Piggyback:100] Out: 1075 [Urine:725; Blood:350] Intake/Output this shift: No intake/output data recorded.  Recent Labs    02/22/17 1217 02/22/17 1856 02/23/17 0452 02/24/17 0810 02/25/17 0528  HGB 10.5* 9.2* 9.0* 9.3* 10.2*   Recent Labs    02/24/17 0810 02/25/17 0528  WBC 11.0* 8.7  RBC 3.70* 3.99*  HCT 29.4* 32.0*  PLT 203 180   Recent Labs    02/24/17 0810 02/25/17 0528  NA 135 133*  K 3.7 4.6  CL 99* 99*  CO2 23 21*  BUN 17 25*  CREATININE 1.35* 1.39*  GLUCOSE 199* 289*  CALCIUM 10.0 9.7   No results for input(s): LABPT, INR in the last 72 hours.  Mild blood on dressing.  No results found.  Assessment/Plan: 1 Day Post-Op Procedure(s) (LRB): ARTHROPLASTY POSTERIOR (HEMIARTHROPLASTY) (Left) Discharge to SNF. Pt cannot even open his own phone manually which is flip phone. Pt states he has someone at home with him at all times which is not correct. Will need SNF to work on transfers, self care. Needs motivation and wants everyone  To do things for him.   [name] 02/25/2017, 8:02 AM

## 2017-02-25 NOTE — Op Note (Signed)
Preop diagnosis: Left displaced femoral neck fracture, closed.  Postoperative diagnosis: Same  Procedure: Left hip hemiarthroplasty Depuy 5 stem +0 neck 52 mm monopolar.  Surgeon: Rodell Perna MD  Anesthesia: General plus Marcaine local  Procedure: After induction general anesthesia preoperative antibiotics timeout procedure patient placed in lateral position prepping with DuraPrep with Rewa Weissberg 7 frame stabilization.  Axillary roll was placed.  DuraPrep down to the patient's below-knee amputation split sheets drapes sterile skin marker Betadine Steri-Drape x2 sealing the skin was performed.  Posterior approach was made gluteus maximus was split and Charnley retractor placed piriformis tagged the capsule was opened and neck was cut 1 fingerbreadth above the lesser trochanter.  The head was removed with a corkscrew measured 52 mm size gave excellent suction fit.  Canal preparation progressing up to #5 due to the patient's large canal size.  Department stent was inserted trial was inserted there was good stability with +0 neck with flexion and 90 internal rotation 80 degrees with stable hip joint.  Permanent monopolar ball was assembled trunion was cleaned.  Balls popped on hip was reduced again protecting the sciatic nerve.  Capsule was repaired piriformis repair to gluteus medius tendon.  The tensor fascia gluteus maximus were repaired with #1 Vicryl 2-0 Vicryl subtendinous tissue skin staple closure Marcaine infiltration for postoperative dressing.

## 2017-02-25 NOTE — Progress Notes (Signed)
  Pharmacy Consult for restarting Apixaban post-op Indication: atrial fibrillation/DVT  No Known Allergies  Patient Measurements: Height: 5' 7.99" (172.7 cm) Weight: 164 lb 0.4 oz (74.4 kg) IBW/kg (Calculated) : 68.38  Vital Signs: Temp: 97.5 F (36.4 C) (02/21 1336) Temp Source: Oral (02/21 1336) BP: 112/64 (02/21 1336) Pulse Rate: 63 (02/21 1336)  Labs: Recent Labs    02/23/17 0452 02/24/17 0810 02/25/17 0528  HGB 9.0* 9.3* 10.2*  HCT 27.7* 29.4* 32.0*  PLT 152 203 180  CREATININE 1.48* 1.35* 1.39*  CKTOTAL 115  --   --    Estimated Creatinine Clearance: 39 mL/min (A) (by C-G formula based on SCr of 1.39 mg/dL (H)).  Medical History: Past Medical History:  Diagnosis Date  . BPH (benign prostatic hyperplasia)   . CAD (coronary artery disease)    Multivessel status post CABG 2011 - LIMA to LAD, SVG to diagonal, SVG to OM, SVG to PDA  . Cellulitis    12/15  . Chronic back pain   . Chronic diastolic CHF (congestive heart failure) ([place])   . CKD (chronic kidney disease), stage III ([place])   . Closed hip fracture ([place]) 02/2017  . COPD (chronic obstructive pulmonary disease) ([place])   . Essential hypertension   . Gastric mass    EGD 9/15  . GERD (gastroesophageal reflux disease)   . History of DVT (deep vein thrombosis)    Postphlebitic syndrome  . History of kidney stones   . HOH (hard of hearing)   . Hx of CABG   . Hyperlipidemia   . Persistent atrial fibrillation ([place])    a. s/p DCCV 03/2016.  [name] Sleep apnea    Stop Bang score of 5  . Type 2 diabetes mellitus ([place])   . UTI (urinary tract infection) 11/2016   Assessment: [name] is a 84 y.o. male who suffered a fall, then sustained L femoral neck fracture with hemiarthroplasty.  He is now s/p surgery and we have been asked to resume his apixaban therapy.  Review Criteria: Age - > 80yo Weight - < 60kg Creatinine - 1.5mg /dL  He meets 1 of the above criteria so home regimen of 5mg  bid can be resumed  without reduction.  Would favor watching renal function as he is close at 1.3 mg/dL.  Plan:  Discontinue Lovenox Plan to resume Apixaban tomorrow at 5mg  bid.  Rober Minion, PharmD., MS Clinical Pharmacist Pager:  [phone] Thank you for allowing pharmacy to be part of this patients care team. 02/25/2017,4:09 PM

## 2017-02-25 NOTE — Plan of Care (Signed)
  Education: Knowledge of General Education information will improve 02/25/2017 0429 - Progressing by Anson Fret, RN Note POC reviewed with pt.; pt. forgetful at times and have to re inform pt.

## 2017-02-25 NOTE — Progress Notes (Addendum)
PROGRESS NOTE    [name]  ACZ:[id number] DOB: 08/02/1931 DOA: 02/22/2017 PCP: [name], MD  Outpatient Specialists:   Brief Narrative: Patient is an 84 year old Caucasian male with past medical history significant for BPH, multivessel coronary artery disease, chronic back pain, type 2 diabetes mellitus, atrial fibrillation on Eliquis, hypertension, GERD and chronic kidney disease.  The patient is recently status post having a left below-knee amputation on 10/07/16 by Dr. Lorin Mercy.  The patient had received a prosthesis 4 days prior to presentation and he started using it without the assistance of physical therapy.  Patient fell and hurt his left hip and has had a difficult time being able to bear any weight since that time.  Patient was brought to the emergency room by a friend who found him down at home on the floor.  He had been on the floor for the entire night.  He was unable to get up to get to a telephone or get any help.  Patient has been admitted with left hip fracture.  Status post left hip hemiarthroplasty 2/20.  SNF pending bed availability..  Assessment & Plan:   Active Problems:   Essential hypertension   History of DVT (deep vein thrombosis)   Leukocytosis   Chronic back pain   Chronic diastolic CHF (congestive heart failure) (HCC)   Acute renal failure superimposed on stage 3 chronic kidney disease (HCC)   Atrial fibrillation with normal ventricular rate (HCC)   Pressure injury of skin   Type 2 diabetes mellitus (HCC)   COPD (chronic obstructive pulmonary disease) (HCC)   CAD (coronary artery disease)   UTI (urinary tract infection)   Closed left hip fracture (HCC)   GERD (gastroesophageal reflux disease)   CKD (chronic kidney disease), stage III (HCC)   Hyperglycemia   Anemia in chronic kidney disease (CKD)   Hypercalcemia   Moderate dehydration   Fall as cause of accidental injury at home as place of occurrence   S/P BKA (below knee amputation) unilateral,  left (HCC)   Bilateral hip pain   Pyuria   Hyperbilirubinemia    Acute left femoral neck fracture -Orthopedics consulted and patient underwent left hip hemiarthroplasty (posterior arthroplasty) on 2/20.  As per orthopedics, recommend SNF at discharge. - Discussed with Dr. Lorin Mercy 2/21> OK to resume higher home dose of Eliquis from 2/22.  Fall at home: -the patient was down for multiple hours on the floor.   -CPK today was 115.  No significant rhabdomyolysis.  SNF at discharge.  Moderate dehydration: - Resolved after IV fluids.  Anemia and chronic kidney disease: -Hemoglobin stable in the 9 g range for the last 3 days PTA.   -Hemoglobin 10.2 today.  Transfused 1 PRBC's at surgery.  Follow CBC.  Leukocytosis: -?  Related to stress of fracture.  Resolved.  Urinary tract infection: -patient had a Foley catheter removed in January/2018.  He has marked pyuria and was started on empiric ceftriaxone IV.  Blood cultures-see discussion below.  Urine culture does not appear to have been sent.. Completed 4 days of IV ceftriaxone, discontinued 2/21.  1 of 4 blood cultures positive for gram-positive rods/diphtheroids - BCID did not detect anything.  As per discussion with pharmacy, likely contaminant.    Type II DM with hyperglycemia: -A1c 8 on 11/14/16.  Continue SSI.  Mildly uncontrolled and fluctuating.  Add bedtime Lantus 7 units.  Hypercalcemia/hyperbilirubinemia: -would like to repeat these labs after adequate IV fluid hydration. -Calcium is down to 9.7.  Persistent atrial  fibrillation: -heart rate is currently controlled.  He has been anticoagulated with Eliquis.  Holding Eliquis at this time pending surgery.  Resume Eliquis when okay with orthopedics.  Currently on Lovenox for DVT prophylaxis.  Coronary artery disease: -stable no symptoms of chest pain.  No EKG findings to suggest ischemia.  Essential hypertension: -resume home blood pressure medications.  With  controlled.  COPD: -Stable.  Chronic diastolic congestive heart failure: -Compensated at this time.  GERD: - Protonix ordered for GI protection.  Chronic back pain: -Controlled.  Acute on chronic kidney disease stage III: - IV fluid hydration has been ordered.  Renally dose medications as appropriate.  Follow BMP. -Serum creatinine is down to 1.35.  Creatinine stable in the 1.3 range.   DVT Prophylaxis: Lovenox until eliquis is restarted Code Status: Partial Family Communication:  None at bedside. Disposition Plan:  SNF when bed available.    Consultants:   Orthopedic team.  Procedures:   Left hip hemiarthroplasty 2/20  Antimicrobials:   IV Rocephin-discontinued 2/21.   Subjective: Appropriate postop left hip pain.  Denies any other complaints.  Objective: Vitals:   02/24/17 1604 02/25/17 0537 02/25/17 0700 02/25/17 1336  BP:  122/85 136/77 112/64  Pulse:  72 70 63  Resp:  18 16 12   Temp:  98.2 F (36.8 C) (!) 97.3 F (36.3 C) (!) 97.5 F (36.4 C)  TempSrc:  Oral Oral Oral  SpO2:  96% 97% 94%  Weight: 74.4 kg (164 lb 0.4 oz)     Height: 5' 7.99" (1.727 m)       Intake/Output Summary (Last 24 hours) at 02/25/2017 1540 Last data filed at 02/25/2017 1500 Gross per 24 hour  Intake 1815 ml  Output 1075 ml  Net 740 ml   Filed Weights   02/23/17 1434 02/24/17 1604  Weight: 74.4 kg (164 lb 0.4 oz) 74.4 kg (164 lb 0.4 oz)    Examination:  General exam: Pleasant elderly male, moderately built and nourished lying comfortably supine in bed.  Does not appear in any distress. Respiratory system: Clear to auscultation. Respiratory effort normal.  Sternotomy scar.  Stable without change. Cardiovascular system: S1-S2 heard, RRR.  No JVD, murmurs or pedal edema. Gastrointestinal system: Abdomen is nondistended, soft and nontender. No organomegaly or masses felt. Normal bowel sounds heard.  Stable without change. Central nervous system: Alert and oriented. No  focal neurological deficits. Extremities: Symmetric 5 x 5 power.  Left BKA.  Left hip postop site dressing clean and dry. Psychiatry: Judgement and insight appear impaired. Mood & affect flat.     Data Reviewed: I have personally reviewed following labs and imaging studies  CBC: Recent Labs  Lab 02/22/17 1217 02/22/17 1856 02/23/17 0452 02/24/17 0810 02/25/17 0528  WBC 19.4* 14.6* 13.0* 11.0* 8.7  NEUTROABS 18.2*  --   --   --   --   HGB 10.5* 9.2* 9.0* 9.3* 10.2*  HCT 31.9* 28.6* 27.7* 29.4* 32.0*  MCV 77.4* 78.6 78.7 79.5 80.2  PLT 169 148* 152 203 235   Basic Metabolic Panel: Recent Labs  Lab 02/22/17 1217 02/22/17 1856 02/23/17 0452 02/24/17 0810 02/25/17 0528  NA 133*  --  135 135 133*  K 4.2  --  3.5 3.7 4.6  CL 98*  --  101 99* 99*  CO2 21*  --  22 23 21*  GLUCOSE 232*  --  193* 199* 289*  BUN 27*  --  20 17 25*  CREATININE 1.90* 1.81* 1.48* 1.35* 1.39*  CALCIUM 10.8*  --  10.1 10.0 9.7   GFR: Estimated Creatinine Clearance: 39 mL/min (A) (by C-G formula based on SCr of 1.39 mg/dL (H)). Liver Function Tests: Recent Labs  Lab 02/22/17 1217  AST 20  ALT 11*  ALKPHOS 73  BILITOT 1.7*  PROT 6.8  ALBUMIN 3.6   Cardiac Enzymes: Recent Labs  Lab 02/22/17 1217 02/23/17 0452  CKTOTAL 415* 115   CBG: Recent Labs  Lab 02/24/17 1542 02/24/17 1848 02/24/17 2204 02/25/17 0814 02/25/17 1154  GLUCAP 132* 130* 181* 263* 268*   Urine analysis:    Component Value Date/Time   COLORURINE YELLOW 02/22/2017 1333   APPEARANCEUR HAZY (A) 02/22/2017 1333   APPEARANCEUR Cloudy (A) 11/12/2016 1101   LABSPEC 1.012 02/22/2017 1333   PHURINE 5.0 02/22/2017 1333   GLUCOSEU 50 (A) 02/22/2017 1333   HGBUR MODERATE (A) 02/22/2017 1333   BILIRUBINUR NEGATIVE 02/22/2017 1333   BILIRUBINUR Negative 11/12/2016 1101   KETONESUR 5 (A) 02/22/2017 1333   PROTEINUR 100 (A) 02/22/2017 1333   UROBILINOGEN 0.2 12/19/2013 1905   NITRITE NEGATIVE 02/22/2017 1333    LEUKOCYTESUR LARGE (A) 02/22/2017 1333   LEUKOCYTESUR 2+ (A) 11/12/2016 1101    Recent Results (from the past 240 hour(s))  Culture, blood (Routine X 2) w Reflex to ID Panel     Status: Abnormal   Collection Time: 02/22/17  2:44 PM  Result Value Ref Range Status   Specimen Description   Final    RIGHT ANTECUBITAL Performed at [facility], [address], [place], [place] [address]    Special Requests   Final    BOTTLES DRAWN AEROBIC AND ANAEROBIC Blood Culture adequate volume Performed at [facility], [address] [place], [place] [address]    Culture  Setup Time   Final    GRAM POSITIVE RODS AEROBIC BOTTLE ONLY Gram Stain Report Called to,Read Back By and Verified With: Marya Landry, RN @ [facility] @ 0406 ON 2.20.19 BY L.BOWMAN CRITICAL RESULT CALLED TO, READ BACK BY AND VERIFIED WITH: Josefine Class [id number] MLM Performed at [facility], [place] [address], [place], [place] [address]    Culture DIPHTHEROIDS(CORYNEBACTERIUM SPECIES) (A)  Final   Report Status 02/25/2017 FINAL  Final  Blood Culture ID Panel (Reflexed)     Status: None   Collection Time: 02/22/17  2:44 PM  Result Value Ref Range Status   Enterococcus species NOT DETECTED NOT DETECTED Final   Listeria monocytogenes NOT DETECTED NOT DETECTED Final   Staphylococcus species NOT DETECTED NOT DETECTED Final   Staphylococcus aureus NOT DETECTED NOT DETECTED Final   Streptococcus species NOT DETECTED NOT DETECTED Final   Streptococcus agalactiae NOT DETECTED NOT DETECTED Final   Streptococcus pneumoniae NOT DETECTED NOT DETECTED Final   Streptococcus pyogenes NOT DETECTED NOT DETECTED Final   Acinetobacter baumannii NOT DETECTED NOT DETECTED Final   Enterobacteriaceae species NOT DETECTED NOT DETECTED Final   Enterobacter cloacae complex NOT DETECTED NOT DETECTED Final   Escherichia coli NOT DETECTED NOT DETECTED Final   Klebsiella oxytoca NOT DETECTED NOT DETECTED Final   Klebsiella pneumoniae NOT DETECTED NOT  DETECTED Final   Proteus species NOT DETECTED NOT DETECTED Final   Serratia marcescens NOT DETECTED NOT DETECTED Final   Haemophilus influenzae NOT DETECTED NOT DETECTED Final   Neisseria meningitidis NOT DETECTED NOT DETECTED Final   Pseudomonas aeruginosa NOT DETECTED NOT DETECTED Final   Candida albicans NOT DETECTED NOT DETECTED Final   Candida glabrata NOT DETECTED NOT DETECTED  Final   Candida krusei NOT DETECTED NOT DETECTED Final   Candida parapsilosis NOT DETECTED NOT DETECTED Final   Candida tropicalis NOT DETECTED NOT DETECTED Final    Comment: Performed at [facility], [place] [address], [place], [place] [address]  Culture, blood (Routine X 2) w Reflex to ID Panel     Status: None (Preliminary result)   Collection Time: 02/22/17  2:46 PM  Result Value Ref Range Status   Specimen Description LEFT ANTECUBITAL  Final   Special Requests   Final    BOTTLES DRAWN AEROBIC AND ANAEROBIC Blood Culture adequate volume   Culture   Final    NO GROWTH 3 DAYS Performed at [facility], [address], [place], [place] [address]    Report Status PENDING  Incomplete  Surgical pcr screen     Status: Abnormal   Collection Time: 02/24/17  4:48 AM  Result Value Ref Range Status   MRSA, PCR POSITIVE (A) NEGATIVE Final    Comment: RESULT CALLED TO, READ BACK BY AND VERIFIED WITH: C BULIAA RN 02/24/17 0649 JDW    Staphylococcus aureus POSITIVE (A) NEGATIVE Final    Comment: RESULT CALLED TO, READ BACK BY AND VERIFIED WITH: COren Binet RN 02/24/17 [phone] JDW (NOTE) The Xpert SA Assay (FDA approved for NASAL specimens in patients 84 years of age and older), is one component of a comprehensive surveillance program. It is not intended to diagnose infection nor to guide or monitor treatment. Performed at [facility], [place] [address] [place], [place] [address]          Radiology Studies: No results found.      Scheduled Meds: . amLODipine  10 mg Oral Daily  . Chlorhexidine  Gluconate Cloth  6 each Topical Q0600  . cloNIDine  0.1 mg Oral BID  . docusate sodium  100 mg Oral BID  . enoxaparin (LOVENOX) injection  40 mg Subcutaneous Q24H  . feeding supplement (GLUCERNA SHAKE)  237 mL Oral TID BM  . finasteride  5 mg Oral Daily  . FLUoxetine  10 mg Oral Daily  . insulin aspart  0-9 Units Subcutaneous TID WC  . metoprolol succinate  25 mg Oral Daily  . mupirocin ointment  1 application Nasal BID  . pantoprazole  40 mg Oral Daily  . tamsulosin  0.4 mg Oral Daily   Continuous Infusions: . sodium chloride    . cefTRIAXone (ROCEPHIN)  IV 1 g (02/25/17 1445)  . lactated ringers 10 mL/hr at 02/24/17 1609     LOS: 3 days    Time spent: 35 minutes  Vernell Leep, MD, [place], [facility]. [facility] Pager [phone]  If 7PM-7AM, please contact night-coverage [email or web address] Password [facility] 02/25/2017, 3:40 PM

## 2017-02-25 NOTE — Clinical Social Work Note (Addendum)
Clinical Social Work Assessment  Patient Details  Name: JOBAN COLLEDGE MRN: [id number] Date of Birth: [birth year]  Date of referral:                  Reason for consult:  Facility Placement                Permission sought to share information with:  Facility Sport and exercise psychologist, Family Supports Permission granted to share information::  No(pt not fully oriented)  Name::      Christell Constant  Agency::     Relationship::  daughter  Contact Information:   [phone]  Housing/Transportation Living arrangements for the past 2 months:  [place], [place] of Information:  Adult Children Patient Interpreter Needed:  None Criminal Activity/Legal Involvement Pertinent to Current Situation/Hospitalization:  No - Comment as needed Significant Relationships:  Adult Children, Other Family Members, Friend, Siblings Lives with:  Self Do you feel safe going back to the place where you live?  No Need for family participation in patient care:  Yes (Comment)  Care giving concerns:  Pt has been living at home after a prior SNF stay, pt has a prosthetic which he is still learning to use, and now has had orthopedic surgery, pt daughter states that she thinks he left rehab too soon last time and that his caregiver at home did not fully work out to provide the amount of support needed.    Social Worker assessment / plan:  CSW spoke with pt daughter on the phone, as pt is not fully oriented, and had just pulled off catheter prior to CSW stopping by room. Pt daughter stated understanding of PT/OT recommendations and states that pt had previously gone to rehab and "left too early" at the encouragement of his friends. Pt daughter states she thinks her dad needs support to learn how to efficaciously use his prosthetic and recover from current injury. Pt daughter is okay with being updated as any SNF options become available. CSW to intiate SNF referral process.   Employment  status:  Retired Nurse, adult PT Recommendations:  [place], [place] / Referral to community resources:     Patient/Family's Response to care: Pt daughter accepting and amenable to SNF recommendations and would like options in order to make a decision for her dad.  Patient/Family's Understanding of and Emotional Response to Diagnosis, Current Treatment, and Prognosis:  Pt daughter verbalized understanding of diagnosis, current treatment, and prognosis. Pt daughter did voice some frustration that pt had fallen, and feels strongly that continued therapies and care at SNF would be safest for him.  Emotional Assessment Appearance:  Appears stated age Attitude/Demeanor/Rapport:  Attention Seeking, Uncooperative Affect (typically observed):  Inappropriate Orientation:  Oriented to Self, Oriented to Place, Fluctuating Orientation (Suspected and/or reported Sundowners) Alcohol / Substance use:  (former smoker) Psych involvement (Current and /or in the community):  No (Comment)  Discharge Needs  Concerns to be addressed:  Care Coordination, Discharge Planning Concerns Readmission within the last 30 days:  Yes Current discharge risk:  Physical Impairment, Dependent with Mobility Barriers to Discharge:  Continued Medical Work up, [employer], [place] 02/25/2017, 4:18 PM

## 2017-02-25 NOTE — Progress Notes (Signed)
Inpatient Diabetes Program Recommendations  AACE/ADA: New Consensus Statement on Inpatient Glycemic Control (2015)  Target Ranges:  Prepandial:   less than 140 mg/dL      Peak postprandial:   less than 180 mg/dL (1-2 hours)      Critically ill patients:  140 - 180 mg/dL   Lab Results  Component Value Date   GLUCAP 263 (H) 02/25/2017   HGBA1C 8.0 (H) 11/14/2016    Review of Glycemic Control  Results for RAMEEN, QUINNEY (MRN [id number]) as of 02/25/2017 10:09  Ref. Range 02/24/2017 12:12 02/24/2017 15:42 02/24/2017 18:48 02/24/2017 22:04 02/25/2017 08:14  Glucose-Capillary Latest Ref Range: 65 - 99 mg/dL 156 (H) 132 (H) 130 (H) 181 (H) 263 (H)    Diabetes history: Type 2 Outpatient Diabetes medications: Lantus 15 units qhs, Humalog 20 units daily (from medication record)  Current orders for Inpatient glycemic control: Novolog 0-9 units tid  Inpatient Diabetes Program Recommendations:  Fasting CBG 263mg /dl- please consider starting Lantus 7 units qhs  Gentry Fitz, RN, [place], [place], CDE Diabetes Coordinator Inpatient Diabetes Program  [phone] (Team Pager) [phone] ([place]) 02/25/2017 10:12 AM

## 2017-02-25 NOTE — Progress Notes (Signed)
From PACU via bed; alert and oriented; little sleepy.

## 2017-02-25 NOTE — Evaluation (Signed)
Physical Therapy Evaluation Patient Details Name: [name] MRN: [id number] DOB: 1931-07-17 Today's Date: 02/25/2017   History of Present Illness  [name] is a 84 y.o. male who attempted to stand up in new L LE prosthesis and fell, then sustained L femoral neck fracture with hemiarthroplasty, posterior precautions.  NWB as well for now, so cannot don prosthesis to avoid WB due to pt confusion.  PMHx:  a-fib, DM, CKD 3, BK infection, UTI, osteopenia, COPD, DVT, cellulitis, CAD, CABG  Clinical Impression  Pt is notably confused and struggling to move, but was able to be assisted to bedside for controlled transfer.  He is progressing toward standing with walker and did need mod max assist to pivot to chair.  He is not aware of his L hip precautions and did not focus on verbal instructions for them.  Will reinforce this as he is able to understand.  Follow acutely for strengthening and ROM to L hip as able, then to change his program to more standing as he is tolerating and along with progression of WB instructions.    Follow Up Recommendations SNF    Equipment Recommendations  None recommended by PT    Recommendations for Other Services       Precautions / Restrictions Precautions Precautions: Posterior Hip Precaution Booklet Issued: No Precaution Comments: pt is not using L LE yet due to NWB and is confused, will educate when appropriate Required Braces or Orthoses: (LLE prosthesis) Restrictions Weight Bearing Restrictions: Yes LLE Weight Bearing: Non weight bearing      Mobility  Bed Mobility Overal bed mobility: Needs Assistance Bed Mobility: Supine to Sit     Supine to sit: Mod assist;Max assist     General bed mobility comments: assisted trunk and used bed pad for LE's with slow progression to side of bed  Transfers Overall transfer level: Needs assistance Equipment used: 1 person hand held assist Transfers: Sit to/from [facility] Sit to  Stand: Mod assist;From elevated surface;+2 physical assistance;+2 safety/equipment Stand pivot transfers: Mod assist;Max assist;From elevated surface;+2 physical assistance;+2 safety/equipment       General transfer comment: second person standing by for safety  Ambulation/Gait             General Gait Details: unable  Stairs            Wheelchair Mobility    Modified Rankin (Stroke Patients Only)       Balance Overall balance assessment: History of Falls;Needs assistance Sitting-balance support: Feet supported;Bilateral upper extremity supported Sitting balance-Leahy Scale: Fair Sitting balance - Comments: fair once set   Standing balance support: Bilateral upper extremity supported;During functional activity Standing balance-Leahy Scale: Poor                               Pertinent Vitals/Pain Pain Assessment: Faces Faces Pain Scale: Hurts even more Pain Location: L hip Pain Descriptors / Indicators: Operative site guarding;Sharp Pain Intervention(s): Limited activity within patient's tolerance;Monitored during session;Premedicated before session;Repositioned;Patient requesting pain meds-RN notified    Home Living Family/patient expects to be discharged to:: Skilled nursing facility Living Arrangements: Alone                    Prior Function Level of Independence: Needs assistance   Gait / Transfers Assistance Needed: has been home to transfer with sitter/caregiver  ADL's / Homemaking Assistance Needed: has home assistance for all housework  Comments: has  become more dependent since BK amputation     Hand Dominance   Dominant Hand: Right    Extremity/Trunk Assessment   Upper Extremity Assessment Upper Extremity Assessment: Generalized weakness    Lower Extremity Assessment Lower Extremity Assessment: Generalized weakness;LLE deficits/detail LLE Deficits / Details: L BK amputation with edema and pain L hip LLE: Unable to  fully assess due to pain LLE Coordination: decreased gross motor    Cervical / Trunk Assessment Cervical / Trunk Assessment: Kyphotic  Communication   Communication: HOH  Cognition Arousal/Alertness: Awake/alert Behavior During Therapy: Flat affect Overall Cognitive Status: Impaired/Different from baseline Area of Impairment: Attention;Memory;Following commands;Safety/judgement;Awareness;Problem solving                   Current Attention Level: Selective Memory: Decreased short-term memory Following Commands: Follows one step commands inconsistently;Follows one step commands with increased time Safety/Judgement: Decreased awareness of deficits;Decreased awareness of safety Awareness: Intellectual Problem Solving: Slow processing;Decreased initiation;Difficulty sequencing;Requires verbal cues;Requires tactile cues        General Comments      Exercises     Assessment/Plan    PT Assessment Patient needs continued PT services  PT Problem List Decreased strength;Decreased range of motion;Decreased activity tolerance;Decreased balance;Decreased mobility;Decreased coordination;Decreased cognition;Decreased knowledge of use of DME;Decreased safety awareness;Cardiopulmonary status limiting activity;Decreased knowledge of precautions;Decreased skin integrity;Pain;Obesity       PT Treatment Interventions DME instruction;Gait training;Functional mobility training;Therapeutic activities;Therapeutic exercise;Balance training;Neuromuscular re-education;Patient/family education    PT Goals (Current goals can be found in the Care Plan section)  Acute Rehab PT Goals Patient Stated Goal: none stated PT Goal Formulation: Patient unable to participate in goal setting Time For Goal Achievement: 03/11/17 Potential to Achieve Goals: Fair    Frequency Min 2X/week   Barriers to discharge Inaccessible home environment;Decreased caregiver support has sitter who is there as one person to  move him    Co-evaluation               AM-PAC PT "6 Clicks" Daily Activity  Outcome Measure Difficulty turning over in bed (including adjusting bedclothes, sheets and blankets)?: Unable Difficulty moving from lying on back to sitting on the side of the bed? : Unable Difficulty sitting down on and standing up from a chair with arms (e.g., wheelchair, bedside commode, etc,.)?: Unable Help needed moving to and from a bed to chair (including a wheelchair)?: A Lot Help needed walking in hospital room?: Total Help needed climbing 3-5 steps with a railing? : Total 6 Click Score: 7    End of Session Equipment Utilized During Treatment: Gait belt Activity Tolerance: Patient limited by fatigue;Patient limited by pain;Other (comment)(weakness) Patient left: in chair;with call bell/phone within reach;with chair alarm set Nurse Communication: Mobility status PT Visit Diagnosis: Unsteadiness on feet (R26.81);Difficulty in walking, not elsewhere classified (R26.2);Muscle weakness (generalized) (M62.81);Adult, failure to thrive (R62.7);Pain Pain - Right/Left: Left Pain - part of body: Hip    Time: 1123-1205 PT Time Calculation (min) (ACUTE ONLY): 42 min   Charges:   PT Evaluation $PT Eval Moderate Complexity: 1 Mod PT Treatments $Therapeutic Exercise: 8-22 mins $Therapeutic Activity: 8-22 mins   PT G Codes:   PT G-Codes **NOT FOR INPATIENT CLASS** Functional Assessment Tool Used: AM-PAC 6 Clicks Basic Mobility    Ramond Dial 02/25/2017, 3:48 PM   Mee Hives, PT MS Acute Rehab Dept. Number: [place] and [place]

## 2017-02-25 NOTE — NC FL2 (Signed)
[place] MEDICAID FL2 LEVEL OF CARE SCREENING TOOL     IDENTIFICATION  Patient Name: [name] Birthdate: 22-Aug-1931 Sex: male Admission Date (Current Location): 02/22/2017  [facility] and [place] Number:  [employer] and Address:  [facility] [place] [address], [place], [place] [address]      Provider Number: [id number]  Attending Physician Name and Address:  Modena Jansky, MD  Relative Name and Phone Number:       Current Level of Care: Hospital Recommended Level of Care: [place] Prior Approval Number:    Date Approved/Denied:   PASRR Number: [id number]  Discharge Plan: SNF    Current Diagnoses: Patient Active Problem List   Diagnosis Date Noted  . Closed left hip fracture ([place]) 02/22/2017  . GERD (gastroesophageal reflux disease) 02/22/2017  . CKD (chronic kidney disease), stage III ([place]) 02/22/2017  . Hyperglycemia 02/22/2017  . Anemia in chronic kidney disease (CKD) 02/22/2017  . Hypercalcemia 02/22/2017  . Moderate dehydration 02/22/2017  . Fall as cause of accidental injury at home as place of occurrence 02/22/2017  . S/P BKA (below knee amputation) unilateral, left ([place]) 02/22/2017  . Bilateral hip pain 02/22/2017  . Pyuria 02/22/2017  . Hyperbilirubinemia 02/22/2017  . UTI (urinary tract infection) 11/13/2016  . Cellulitis and abscess of foot   . COPD (chronic obstructive pulmonary disease) ([place]) 09/09/2016  . CAD (coronary artery disease) 09/09/2016  . Hyperlipidemia 09/09/2016  . Type 2 diabetes mellitus ([place]) 07/24/2016  . Pressure injury of skin 06/11/2016  . Atrial fibrillation with normal ventricular rate ([place]) 06/10/2016  . Esophageal stricture 06/09/2016  . CAD in native artery 06/08/2016  . Acute renal failure superimposed on stage 3 chronic kidney disease ([place]) 06/08/2016  . Chronic diastolic CHF (congestive heart failure) ([place]) 03/19/2016  . Abnormal weight loss   . Thrush, oral  12/18/2013  . Chest pain at rest 12/18/2013  . Leukocytosis 12/18/2013  . Diabetes ([place]) 12/18/2013  . Chronic back pain 12/18/2013  . Dysphagia 12/18/2013  . Odynophagia 12/18/2013  . Gastric mass 09/07/2013  . Personal history of colonic polyps 08/05/2013  . Nausea and vomiting 08/01/2013  . UTI (lower urinary tract infection) 12/09/2012  . Lower extremity edema 06/02/2012  . Essential hypertension 06/02/2012  . History of DVT (deep vein thrombosis) 06/02/2012  . Obesity (BMI 30-39.9): BMI 31.3 06/02/2012    Orientation RESPIRATION BLADDER Height & Weight     Time, Situation, Self, Place  Normal Incontinent, External catheter Weight: 164 lb 0.4 oz (74.4 kg) Height:  5' 7.99" (172.7 cm)  BEHAVIORAL SYMPTOMS/MOOD NEUROLOGICAL BOWEL NUTRITION STATUS      Continent Diet(carb modified)  AMBULATORY STATUS COMMUNICATION OF NEEDS Skin   Extensive Assist Verbally Surgical wounds(left hip hydrocolloid dressing)                       Personal Care Assistance Level of Assistance  Bathing, Dressing Bathing Assistance: Maximum assistance   Dressing Assistance: Maximum assistance     Functional Limitations Info             SPECIAL CARE FACTORS FREQUENCY  PT (By licensed PT), OT (By licensed OT)     PT Frequency: 5/wk OT Frequency: 5/wk            Contractures      Additional Factors Info  Code Status, Allergies, Psychotropic, Insulin Sliding Scale, Isolation Precautions Code Status Info: Partial Allergies Info: NKA Psychotropic Info: prozac Insulin  Sliding Scale Info: 5/day Isolation Precautions Info: MRSA     Current Medications (02/25/2017):  This is the current hospital active medication list Current Facility-Administered Medications  Medication Dose Route Frequency Provider Last Rate Last Dose  . acetaminophen (TYLENOL) tablet 650 mg  650 mg Oral Q4H PRN Lanae Crumbly, PA-C       Or  . acetaminophen (TYLENOL) suppository 650 mg  650 mg Rectal Q4H PRN  Lanae Crumbly, PA-C      . amLODipine (NORVASC) tablet 10 mg  10 mg Oral Daily Johnson, Clanford L, MD   10 mg at 02/25/17 1039  . [START ON 02/26/2017] apixaban (ELIQUIS) tablet 5 mg  5 mg Oral BID Hongalgi, Anand D, MD      . bisacodyl (DULCOLAX) suppository 10 mg  10 mg Rectal Daily PRN Johnson, Clanford L, MD      . Chlorhexidine Gluconate Cloth 2 % PADS 6 each  6 each Topical Q0600 Modena Jansky, MD   6 each at 02/24/17 1215  . cloNIDine (CATAPRES) tablet 0.1 mg  0.1 mg Oral BID Johnson, Clanford L, MD   0.1 mg at 02/25/17 1039  . docusate sodium (COLACE) capsule 100 mg  100 mg Oral BID Lanae Crumbly, PA-C   100 mg at 02/25/17 1039  . feeding supplement (GLUCERNA SHAKE) (GLUCERNA SHAKE) liquid 237 mL  237 mL Oral TID BM Dana Allan I, MD   237 mL at 02/25/17 1500  . finasteride (PROSCAR) tablet 5 mg  5 mg Oral Daily Johnson, Clanford L, MD   5 mg at 02/25/17 1039  . FLUoxetine (PROZAC) capsule 10 mg  10 mg Oral Daily Johnson, Clanford L, MD   10 mg at 02/25/17 1039  . guaiFENesin tablet 200 mg  200 mg Oral Q6H PRN Dana Allan I, MD      . HYDROcodone-acetaminophen (NORCO) 7.5-325 MG per tablet 1 tablet  1 tablet Oral Q4H PRN Lanae Crumbly, PA-C   1 tablet at 02/25/17 1443  . insulin aspart (novoLOG) injection 0-5 Units  0-5 Units Subcutaneous QHS Hongalgi, Anand D, MD      . insulin aspart (novoLOG) injection 0-9 Units  0-9 Units Subcutaneous TID WC Hongalgi, Anand D, MD      . insulin glargine (LANTUS) injection 5 Units  5 Units Subcutaneous QHS Hongalgi, Anand D, MD      . ipratropium-albuterol (DUONEB) 0.5-2.5 (3) MG/3ML nebulizer solution 3 mL  3 mL Nebulization Q6H PRN Johnson, Clanford L, MD      . LORazepam (ATIVAN) injection 0.5 mg  0.5 mg Intravenous Q4H PRN Johnson, Clanford L, MD   0.5 mg at 02/25/17 1542  . menthol-cetylpyridinium (CEPACOL) lozenge 3 mg  1 lozenge Oral PRN Lanae Crumbly, PA-C       Or  . phenol (CHLORASEPTIC) mouth spray 1 spray  1 spray  Mouth/Throat PRN Lanae Crumbly, PA-C      . metoprolol succinate (TOPROL-XL) 24 hr tablet 25 mg  25 mg Oral Daily Johnson, Clanford L, MD   25 mg at 02/25/17 1039  . mupirocin ointment (BACTROBAN) 2 % 1 application  1 application Nasal BID Modena Jansky, MD   1 application at 14/78/29 1051  . nitroGLYCERIN (NITROSTAT) SL tablet 0.4 mg  0.4 mg Sublingual Q5 min PRN Johnson, Clanford L, MD      . ondansetron (ZOFRAN) tablet 4 mg  4 mg Oral Q6H PRN Lanae Crumbly, PA-C       Or  .  ondansetron (ZOFRAN) injection 4 mg  4 mg Intravenous Q6H PRN Lanae Crumbly, PA-C   4 mg at 02/25/17 1143  . pantoprazole (PROTONIX) EC tablet 40 mg  40 mg Oral Daily Johnson, Clanford L, MD   40 mg at 02/25/17 1039  . senna-docusate (Senokot-S) tablet 1 tablet  1 tablet Oral QHS PRN Johnson, Clanford L, MD      . tamsulosin (FLOMAX) capsule 0.4 mg  0.4 mg Oral Daily Johnson, Clanford L, MD   0.4 mg at 02/25/17 1039     Discharge Medications: Please see discharge summary for a list of discharge medications.  Relevant Imaging Results:  Relevant Lab Results:   Additional Information SS#: [id number]  Jorge Ny, LCSW

## 2017-02-26 ENCOUNTER — Inpatient Hospital Stay (HOSPITAL_COMMUNITY): Payer: Medicare Other

## 2017-02-26 LAB — TYPE AND SCREEN
ABO/RH(D): O NEG
Antibody Screen: NEGATIVE
UNIT DIVISION: 0
Unit division: 0

## 2017-02-26 LAB — GLUCOSE, CAPILLARY
GLUCOSE-CAPILLARY: 258 mg/dL — AB (ref 65–99)
Glucose-Capillary: 203 mg/dL — ABNORMAL HIGH (ref 65–99)
Glucose-Capillary: 231 mg/dL — ABNORMAL HIGH (ref 65–99)

## 2017-02-26 LAB — BASIC METABOLIC PANEL
Anion gap: 12 (ref 5–15)
BUN: 30 mg/dL — AB (ref 6–20)
CO2: 22 mmol/L (ref 22–32)
CREATININE: 1.45 mg/dL — AB (ref 0.61–1.24)
Calcium: 10 mg/dL (ref 8.9–10.3)
Chloride: 99 mmol/L — ABNORMAL LOW (ref 101–111)
GFR calc Af Amer: 50 mL/min — ABNORMAL LOW (ref >60)
GFR, EST NON AFRICAN AMERICAN: 43 mL/min — AB (ref >60)
GLUCOSE: 205 mg/dL — AB (ref 65–99)
POTASSIUM: 3.6 mmol/L (ref 3.5–5.1)
Sodium: 133 mmol/L — ABNORMAL LOW (ref 135–145)

## 2017-02-26 LAB — BPAM RBC
Blood Product Expiration Date: 201903092359
Blood Product Expiration Date: 201903102359
ISSUE DATE / TIME: 201902201731
ISSUE DATE / TIME: 201902201731
Unit Type and Rh: 9500
Unit Type and Rh: 9500

## 2017-02-26 LAB — CBC
HCT: 30.9 % — ABNORMAL LOW (ref 39.0–52.0)
Hemoglobin: 10.1 g/dL — ABNORMAL LOW (ref 13.0–17.0)
MCH: 26.6 pg (ref 26.0–34.0)
MCHC: 32.7 g/dL (ref 30.0–36.0)
MCV: 81.3 fL (ref 78.0–100.0)
PLATELETS: 222 10*3/uL (ref 150–400)
RBC: 3.8 MIL/uL — AB (ref 4.22–5.81)
RDW: 16.7 % — ABNORMAL HIGH (ref 11.5–15.5)
WBC: 12.9 10*3/uL — ABNORMAL HIGH (ref 4.0–10.5)

## 2017-02-26 MED ORDER — INSULIN GLARGINE 100 UNIT/ML ~~LOC~~ SOLN
10.0000 [IU] | Freq: Every day | SUBCUTANEOUS | Status: DC
  Administered 2017-02-26: 10 [IU] via SUBCUTANEOUS
  Filled 2017-02-26 (×2): qty 0.1

## 2017-02-26 MED ORDER — HYDROCODONE-ACETAMINOPHEN 7.5-325 MG PO TABS: 1.0000 | ORAL_TABLET | Freq: Four times a day (QID) | ORAL | 0 refills | Status: DC | PRN

## 2017-02-26 NOTE — Progress Notes (Signed)
PT Cancellation Note  Patient Details Name: [name] MRN: [id number] DOB: 1931-08-13   Cancelled Treatment:     Attempted to work with patient at 1640, patient declined therapy citing he is comfortable and exhausted, asks that I come back tomorrow.   Reinaldo Berber, PT, DPT Acute Rehab Services Pager: [phone]     Reinaldo Berber 02/26/2017, 4:48 PM

## 2017-02-26 NOTE — Progress Notes (Signed)
Subjective: C/o left hip pain.     Objective: Vital signs in last 24 hours: Temp:  [97.4 F (36.3 C)-97.5 F (36.4 C)] 97.4 F (36.3 C) (02/21 2109) Pulse Rate:  [63-73] 73 (02/22 0407) Resp:  [12-16] 16 (02/22 0407) BP: (112-124)/(56-80) 124/80 (02/22 0407) SpO2:  [94 %-99 %] 99 % (02/22 0407)  Intake/Output from previous day: 02/21 0701 - 02/22 0700 In: 500 [P.O.:500] Out: -  Intake/Output this shift: Total I/O In: -  Out: 275 [Urine:275]  Recent Labs    02/24/17 0810 02/25/17 0528 02/26/17 0717  HGB 9.3* 10.2* 10.1*   Recent Labs    02/25/17 0528 02/26/17 0717  WBC 8.7 12.9*  RBC 3.99* 3.80*  HCT 32.0* 30.9*  PLT 180 222   Recent Labs    02/25/17 0528 02/26/17 0717  NA 133* 133*  K 4.6 3.6  CL 99* 99*  CO2 21* 22  BUN 25* 30*  CREATININE 1.39* 1.45*  GLUCOSE 289* 205*  CALCIUM 9.7 10.0   No results for input(s): LABPT, INR in the last 72 hours.  Exam: Wound looks good.  Staples intact.  No drainage or signs of infection,.    Assessment/Plan: Continue present care.  Transfer to snf when OK with medicine service.  Script on chart for norco. Patient currently on eliquis.    [name] 02/26/2017, 10:07 AM

## 2017-02-26 NOTE — Progress Notes (Signed)
PROGRESS NOTE    [name]  TWS:[id number] DOB: 03-12-1931 DOA: 02/22/2017 PCP: [name], MD  Outpatient Specialists:   Brief Narrative: Patient is an 84 year old Caucasian male with past medical history significant for BPH, multivessel coronary artery disease, chronic back pain, type 2 diabetes mellitus, atrial fibrillation on Eliquis, hypertension, GERD and chronic kidney disease.  The patient is recently status post having a left below-knee amputation on 10/07/16 by Dr. Lorin Mercy.  The patient had received a prosthesis 4 days prior to presentation and he started using it without the assistance of physical therapy.  Patient fell and hurt his left hip and has had a difficult time being able to bear any weight since that time.  Patient was brought to the emergency room by a friend who found him down at home on the floor.  He had been on the floor for the entire night.  He was unable to get up to get to a telephone or get any help.  Patient has been admitted with left hip fracture.  Status post left hip hemiarthroplasty 2/20.  SNF likely 2/23.  Assessment & Plan:   Active Problems:   Essential hypertension   History of DVT (deep vein thrombosis)   Leukocytosis   Chronic back pain   Chronic diastolic CHF (congestive heart failure) (HCC)   Acute renal failure superimposed on stage 3 chronic kidney disease (HCC)   Atrial fibrillation with normal ventricular rate (HCC)   Pressure injury of skin   Type 2 diabetes mellitus (HCC)   COPD (chronic obstructive pulmonary disease) (HCC)   CAD (coronary artery disease)   UTI (urinary tract infection)   Closed left hip fracture (HCC)   GERD (gastroesophageal reflux disease)   CKD (chronic kidney disease), stage III (HCC)   Hyperglycemia   Anemia in chronic kidney disease (CKD)   Hypercalcemia   Moderate dehydration   Fall as cause of accidental injury at home as place of occurrence   S/P BKA (below knee amputation) unilateral, left (HCC)  Bilateral hip pain   Pyuria   Hyperbilirubinemia    Acute left femoral neck fracture -Orthopedics consulted and patient underwent left hip hemiarthroplasty (posterior arthroplasty) on 2/20.  As per orthopedics, recommend SNF at discharge. - Discussed with Dr. Lorin Mercy 2/21> OK to resume higher home dose of Eliquis from 2/22. -Stable.  Cleared for discharge to SNF by orthopedics.  Fall at home: -the patient was down for multiple hours on the floor.   -CPK today was 115.  No significant rhabdomyolysis.  SNF at discharge.  Moderate dehydration: - Resolved after IV fluids.  Anemia and chronic kidney disease: -Hemoglobin stable in the 9 g range for the last 3 days PTA.   -Hemoglobin 10.2 today.  Transfused 1 PRBC's at surgery.  Stable.  Leukocytosis: -?  Related to stress of fracture.  Stable.  Urinary tract infection: -patient had a Foley catheter removed in January/2018.  He has marked pyuria and was started on empiric ceftriaxone IV.  Blood cultures-see discussion below.  Urine culture does not appear to have been sent.. Completed 4 days of IV ceftriaxone, discontinued 2/21.  1 of 4 blood cultures positive for gram-positive rods/diphtheroids - BCID did not detect anything.  As per discussion with pharmacy, likely contaminant.    Type II DM with hyperglycemia: -A1c 8 on 11/14/16.  Continue SSI.  Mildly uncontrolled and fluctuating.  Increase Lantus to 10 units at bedtime.  Hypercalcemia/hyperbilirubinemia: -would like to repeat these labs after adequate IV fluid hydration. -Calcium  is down to 9.7.  Persistent atrial fibrillation: -heart rate is currently controlled.  He has been anticoagulated with Eliquis.  Holding Eliquis at this time pending surgery.    Resumed Eliquis after discussing with orthopedics.  Coronary artery disease: -stable no symptoms of chest pain.  No EKG findings to suggest ischemia.  Essential hypertension: -resume home blood pressure medications.   Controlled.  COPD: -Stable.  Chronic diastolic congestive heart failure: -Compensated at this time.  GERD: - Protonix ordered for GI protection.  Chronic back pain: -Controlled.  Acute on chronic kidney disease stage III: - IV fluid hydration has been ordered.  Renally dose medications as appropriate.  Follow BMP. -Serum creatinine is down to 1.35.  Creatinine slightly up at 1.45.  Follow BMP in a.m.   DVT Prophylaxis: Lovenox until eliquis is restarted Code Status: Partial Family Communication:  None at bedside. Disposition Plan:  SNF likely 2/23    Consultants:   Orthopedic team.  Procedures:   Left hip hemiarthroplasty 2/20  Antimicrobials:   IV Rocephin-discontinued 2/21.   Subjective: Moderate left hip pain.  As per nursing, patient wants to be helped with activities that he can do himself including feeding himself.  Objective: Vitals:   02/25/17 1336 02/25/17 2109 02/26/17 0407 02/26/17 1438  BP: 112/64 (!) 118/56 124/80 133/69  Pulse: 63 70 73 84  Resp: 12 16 16 16   Temp: (!) 97.5 F (36.4 C) (!) 97.4 F (36.3 C)    TempSrc: Oral Oral Oral   SpO2: 94% 99% 99% 98%  Weight:      Height:        Intake/Output Summary (Last 24 hours) at 02/26/2017 1639 Last data filed at 02/26/2017 1300 Gross per 24 hour  Intake 460 ml  Output 525 ml  Net -65 ml   Filed Weights   02/23/17 1434 02/24/17 1604  Weight: 74.4 kg (164 lb 0.4 oz) 74.4 kg (164 lb 0.4 oz)    Examination:  General exam: Pleasant elderly male, moderately built and nourished lying comfortably supine in bed.  Does not appear in any distress. Respiratory system: Clear to auscultation. Respiratory effort normal.  Sternotomy scar.  Stable without change Cardiovascular system: S1-S2 heard, RRR.  No JVD, murmurs or pedal edema.  Stable without change Gastrointestinal system: Abdomen is nondistended, soft and nontender. No organomegaly or masses felt. Normal bowel sounds heard.  Stable without  change. Central nervous system: Alert and oriented. No focal neurological deficits. Extremities: Symmetric 5 x 5 power.  Left BKA.  Left hip postop site dressing clean and dry. Psychiatry: Judgement and insight appear impaired. Mood & affect flat.     Data Reviewed: I have personally reviewed following labs and imaging studies  CBC: Recent Labs  Lab 02/22/17 1217 02/22/17 1856 02/23/17 0452 02/24/17 0810 02/25/17 0528 02/26/17 0717  WBC 19.4* 14.6* 13.0* 11.0* 8.7 12.9*  NEUTROABS 18.2*  --   --   --   --   --   HGB 10.5* 9.2* 9.0* 9.3* 10.2* 10.1*  HCT 31.9* 28.6* 27.7* 29.4* 32.0* 30.9*  MCV 77.4* 78.6 78.7 79.5 80.2 81.3  PLT 169 148* 152 203 180 416   Basic Metabolic Panel: Recent Labs  Lab 02/22/17 1217 02/22/17 1856 02/23/17 0452 02/24/17 0810 02/25/17 0528 02/26/17 0717  NA 133*  --  135 135 133* 133*  K 4.2  --  3.5 3.7 4.6 3.6  CL 98*  --  101 99* 99* 99*  CO2 21*  --  22 23 21*  22  GLUCOSE 232*  --  193* 199* 289* 205*  BUN 27*  --  20 17 25* 30*  CREATININE 1.90* 1.81* 1.48* 1.35* 1.39* 1.45*  CALCIUM 10.8*  --  10.1 10.0 9.7 10.0   GFR: Estimated Creatinine Clearance: 37.3 mL/min (A) (by C-G formula based on SCr of 1.45 mg/dL (H)). Liver Function Tests: Recent Labs  Lab 02/22/17 1217  AST 20  ALT 11*  ALKPHOS 73  BILITOT 1.7*  PROT 6.8  ALBUMIN 3.6   Cardiac Enzymes: Recent Labs  Lab 02/22/17 1217 02/23/17 0452  CKTOTAL 415* 115   CBG: Recent Labs  Lab 02/25/17 1154 02/25/17 1709 02/25/17 2119 02/26/17 0826 02/26/17 1221  GLUCAP 268* 269* 193* 203* 258*   Urine analysis:    Component Value Date/Time   COLORURINE YELLOW 02/22/2017 1333   APPEARANCEUR HAZY (A) 02/22/2017 1333   APPEARANCEUR Cloudy (A) 11/12/2016 1101   LABSPEC 1.012 02/22/2017 1333   PHURINE 5.0 02/22/2017 1333   GLUCOSEU 50 (A) 02/22/2017 1333   HGBUR MODERATE (A) 02/22/2017 1333   BILIRUBINUR NEGATIVE 02/22/2017 1333   BILIRUBINUR Negative 11/12/2016 1101    KETONESUR 5 (A) 02/22/2017 1333   PROTEINUR 100 (A) 02/22/2017 1333   UROBILINOGEN 0.2 12/19/2013 1905   NITRITE NEGATIVE 02/22/2017 1333   LEUKOCYTESUR LARGE (A) 02/22/2017 1333   LEUKOCYTESUR 2+ (A) 11/12/2016 1101    Recent Results (from the past 240 hour(s))  Culture, blood (Routine X 2) w Reflex to ID Panel     Status: Abnormal   Collection Time: 02/22/17  2:44 PM  Result Value Ref Range Status   Specimen Description   Final    RIGHT ANTECUBITAL Performed at [facility], [address], [place], [place] [address]    Special Requests   Final    BOTTLES DRAWN AEROBIC AND ANAEROBIC Blood Culture adequate volume Performed at [facility], [address] [place], [place] [address]    Culture  Setup Time   Final    GRAM POSITIVE RODS AEROBIC BOTTLE ONLY Gram Stain Report Called to,Read Back By and Verified With: Marya Landry, RN @ [facility] @ 0406 ON 2.20.19 BY L.BOWMAN CRITICAL RESULT CALLED TO, READ BACK BY AND VERIFIED WITH: Josefine Class [id number] MLM Performed at [facility], [place] [address], [place], [place] [address]    Culture DIPHTHEROIDS(CORYNEBACTERIUM SPECIES) (A)  Final   Report Status 02/25/2017 FINAL  Final  Blood Culture ID Panel (Reflexed)     Status: None   Collection Time: 02/22/17  2:44 PM  Result Value Ref Range Status   Enterococcus species NOT DETECTED NOT DETECTED Final   Listeria monocytogenes NOT DETECTED NOT DETECTED Final   Staphylococcus species NOT DETECTED NOT DETECTED Final   Staphylococcus aureus NOT DETECTED NOT DETECTED Final   Streptococcus species NOT DETECTED NOT DETECTED Final   Streptococcus agalactiae NOT DETECTED NOT DETECTED Final   Streptococcus pneumoniae NOT DETECTED NOT DETECTED Final   Streptococcus pyogenes NOT DETECTED NOT DETECTED Final   Acinetobacter baumannii NOT DETECTED NOT DETECTED Final   Enterobacteriaceae species NOT DETECTED NOT DETECTED Final   Enterobacter cloacae complex NOT DETECTED NOT DETECTED Final    Escherichia coli NOT DETECTED NOT DETECTED Final   Klebsiella oxytoca NOT DETECTED NOT DETECTED Final   Klebsiella pneumoniae NOT DETECTED NOT DETECTED Final   Proteus species NOT DETECTED NOT DETECTED Final   Serratia marcescens NOT DETECTED NOT DETECTED Final   Haemophilus influenzae NOT DETECTED NOT DETECTED Final   Neisseria meningitidis  NOT DETECTED NOT DETECTED Final   Pseudomonas aeruginosa NOT DETECTED NOT DETECTED Final   Candida albicans NOT DETECTED NOT DETECTED Final   Candida glabrata NOT DETECTED NOT DETECTED Final   Candida krusei NOT DETECTED NOT DETECTED Final   Candida parapsilosis NOT DETECTED NOT DETECTED Final   Candida tropicalis NOT DETECTED NOT DETECTED Final    Comment: Performed at [facility], [place] [address] [place], [place] [address]  Culture, blood (Routine X 2) w Reflex to ID Panel     Status: None (Preliminary result)   Collection Time: 02/22/17  2:46 PM  Result Value Ref Range Status   Specimen Description LEFT ANTECUBITAL  Final   Special Requests   Final    BOTTLES DRAWN AEROBIC AND ANAEROBIC Blood Culture adequate volume   Culture   Final    NO GROWTH 4 DAYS Performed at [facility], [address], [place], [place] [address]    Report Status PENDING  Incomplete  Surgical pcr screen     Status: Abnormal   Collection Time: 02/24/17  4:48 AM  Result Value Ref Range Status   MRSA, PCR POSITIVE (A) NEGATIVE Final    Comment: RESULT CALLED TO, READ BACK BY AND VERIFIED WITH: C BULIAA RN 02/24/17 0649 JDW    Staphylococcus aureus POSITIVE (A) NEGATIVE Final    Comment: RESULT CALLED TO, READ BACK BY AND VERIFIED WITH: COren Binet RN 02/24/17 [phone] JDW (NOTE) The Xpert SA Assay (FDA approved for NASAL specimens in patients 32 years of age and older), is one component of a comprehensive surveillance program. It is not intended to diagnose infection nor to guide or monitor treatment. Performed at [facility], [place] [address]  [place], [place] [address]          Radiology Studies: Dg Hip Port Unilat With Pelvis 1v Left  Result Date: 02/26/2017 CLINICAL DATA:  Postop left hip arthroplasty. EXAM: DG HIP (WITH OR WITHOUT PELVIS) 1V PORT LEFT COMPARISON:  02/22/2017 FINDINGS: Sequelae of interval left hip hemiarthroplasty are identified. The femoral prosthesis is approximated with the native acetabulum. Postoperative gas is noted in the adjacent soft tissues, and skin staples are in place. Atherosclerotic vascular calcifications are noted. IMPRESSION: Interval left hip hemiarthroplasty without evidence of acute complication. Electronically Signed   By: Logan Bores M.D.   On: 02/26/2017 15:29        Scheduled Meds: . amLODipine  10 mg Oral Daily  . apixaban  5 mg Oral BID  . Chlorhexidine Gluconate Cloth  6 each Topical Q0600  . cloNIDine  0.1 mg Oral BID  . docusate sodium  100 mg Oral BID  . feeding supplement (GLUCERNA SHAKE)  237 mL Oral TID BM  . finasteride  5 mg Oral Daily  . FLUoxetine  10 mg Oral Daily  . insulin aspart  0-5 Units Subcutaneous QHS  . insulin aspart  0-9 Units Subcutaneous TID WC  . insulin glargine  5 Units Subcutaneous QHS  . metoprolol succinate  25 mg Oral Daily  . mupirocin ointment  1 application Nasal BID  . pantoprazole  40 mg Oral Daily  . tamsulosin  0.4 mg Oral Daily   Continuous Infusions:    LOS: 4 days    Time spent: 35 minutes  Vernell Leep, MD, FACP, [facility]. [facility] Pager [phone]  If 7PM-7AM, please contact night-coverage [email or web address] Password [facility] 02/26/2017, 4:39 PM

## 2017-02-26 NOTE — Social Work (Addendum)
CSW spoke with pt daughter, she would like to accept placement at [facility], [facility] have initiated insurance authorization.   CSW continuing to follow.   [place], [place] Work [phone]

## 2017-02-26 NOTE — Discharge Instructions (Addendum)
INSTRUCTIONS AFTER JOINT REPLACEMENT   o Remove items at home which could result in a fall. This includes throw rugs or furniture in walking pathways o ICE to the affected joint every three hours while awake for 30 minutes at a time, for at least the first 3-5 days, and then as needed for pain and swelling.  Continue to use ice for pain and swelling. You may notice swelling that will progress down to the foot and ankle.  This is normal after surgery.  Elevate your leg when you are not up walking on it.   o Continue to use the breathing machine you got in the hospital (incentive spirometer) which will help keep your temperature down.  It is common for your temperature to cycle up and down following surgery, especially at night when you are not up moving around and exerting yourself.  The breathing machine keeps your lungs expanded and your temperature down.   DIET:  As you were doing prior to hospitalization, we recommend a well-balanced diet.  DRESSING / WOUND CARE / SHOWERING Daily dressing changes with gauze and tape.  Do not apply any creams or ointments to incision.    ACTIVITY  o Increase activity slowly as tolerated, but follow the weight bearing instructions below.   o No driving for 6 weeks or until further direction given by your physician.  You cannot drive while taking narcotics.  o No lifting or carrying greater than 10 lbs. until further directed by your surgeon. o Avoid periods of inactivity such as sitting longer than an hour when not asleep. This helps prevent blood clots.  o You may return to work once you are authorized by your doctor.     WEIGHT BEARING   Weight bearing as tolerated with assist device (walker, cane, etc) as directed, use it as long as suggested by your surgeon or therapist, typically at least 4-6 weeks.   EXERCISES  Results after joint replacement surgery are often greatly improved when you follow the exercise, range of motion and muscle strengthening  exercises prescribed by your doctor. Safety measures are also important to protect the joint from further injury. Any time any of these exercises cause you to have increased pain or swelling, decrease what you are doing until you are comfortable again and then slowly increase them. If you have problems or questions, call your caregiver or physical therapist for advice.   Rehabilitation is important following a joint replacement. After just a few days of immobilization, the muscles of the leg can become weakened and shrink (atrophy).  These exercises are designed to build up the tone and strength of the thigh and leg muscles and to improve motion. Often times heat used for twenty to thirty minutes before working out will loosen up your tissues and help with improving the range of motion but do not use heat for the first two weeks following surgery (sometimes heat can increase post-operative swelling).   These exercises can be done on a training (exercise) mat, on the floor, on a table or on a bed. Use whatever works the best and is most comfortable for you.    Use music or television while you are exercising so that the exercises are a pleasant break in your day. This will make your life better with the exercises acting as a break in your routine that you can look forward to.   Perform all exercises about fifteen times, three times per day or as directed.  You should exercise  both the operative leg and the other leg as well.  Exercises include:    Quad Sets - Tighten up the muscle on the front of the thigh (Quad) and hold for 5-10 seconds.    Straight Leg Raises - With your knee straight (if you were given a brace, keep it on), lift the leg to 60 degrees, hold for 3 seconds, and slowly lower the leg.  Perform this exercise against resistance later as your leg gets stronger.   Leg Slides: Lying on your back, slowly slide your foot toward your buttocks, bending your knee up off the floor (only go as far as  is comfortable). Then slowly slide your foot back down until your leg is flat on the floor again.   Angel Wings: Lying on your back spread your legs to the side as far apart as you can without causing discomfort.   Hamstring Strength:  Lying on your back, push your heel against the floor with your leg straight by tightening up the muscles of your buttocks.  Repeat, but this time bend your knee to a comfortable angle, and push your heel against the floor.  You may put a pillow under the heel to make it more comfortable if necessary.   A rehabilitation program following joint replacement surgery can speed recovery and prevent re-injury in the future due to weakened muscles. Contact your doctor or a physical therapist for more information on knee rehabilitation.    CONSTIPATION  Constipation is defined medically as fewer than three stools per week and severe constipation as less than one stool per week.  Even if you have a regular bowel pattern at home, your normal regimen is likely to be disrupted due to multiple reasons following surgery.  Combination of anesthesia, postoperative narcotics, change in appetite and fluid intake all can affect your bowels.   YOU MUST use at least one of the following options; they are listed in order of increasing strength to get the job done.  They are all available over the counter, and you may need to use some, POSSIBLY even all of these options:    Drink plenty of fluids (prune juice may be helpful) and high fiber foods Colace 100 mg by mouth twice a day  Senokot for constipation as directed and as needed Dulcolax (bisacodyl), take with full glass of water  Miralax (polyethylene glycol) once or twice a day as needed.  If you have tried all these things and are unable to have a bowel movement in the first 3-4 days after surgery call either your surgeon or your primary doctor.    If you experience loose stools or diarrhea, hold the medications until you stool  forms back up.  If your symptoms do not get better within 1 week or if they get worse, check with your doctor.  If you experience "the worst abdominal pain ever" or develop nausea or vomiting, please contact the office immediately for further recommendations for treatment.   ITCHING:  If you experience itching with your medications, try taking only a single pain pill, or even half a pain pill at a time.  You can also use Benadryl over the counter for itching or also to help with sleep.   TED HOSE STOCKINGS:  Use stockings on both legs until for at least 2 weeks or as directed by physician office. They may be removed at night for sleeping.  MEDICATIONS:  See your medication summary on the After Visit Summary that nursing will review  with you.  You may have some home medications which will be placed on hold until you complete the course of blood thinner medication.  It is important for you to complete the blood thinner medication as prescribed.  PRECAUTIONS:  If you experience chest pain or shortness of breath - call 911 immediately for transfer to the hospital emergency department.   If you develop a fever greater that 101 F, purulent drainage from wound, increased redness or drainage from wound, foul odor from the wound/dressing, or calf pain - CONTACT YOUR SURGEON.                                                   FOLLOW-UP APPOINTMENTS:  If you do not already have a post-op appointment, please call the office for an appointment to be seen by your surgeon.  Guidelines for how soon to be seen are listed in your After Visit Summary, but are typically between 1-4 weeks after surgery.  OTHER INSTRUCTIONS:   Knee Replacement:  Do not place pillow under knee, focus on keeping the knee straight while resting. CPM instructions: 0-90 degrees, 2 hours in the morning, 2 hours in the afternoon, and 2 hours in the evening. Place foam block, curve side up under heel at all times except when in CPM or when  walking.  DO NOT modify, tear, cut, or change the foam block in any way.  MAKE SURE YOU:   Understand these instructions.   Get help right away if you are not doing well or get worse.    Thank you for letting us be a part of your medical care team.  It is a privilege we respect greatly.  We hope these instructions will help you stay on track for a fast and full recovery!     Information on my medicine - ELIQUIS (apixaban)  This medication education was reviewed with me or my healthcare representative as part of my discharge preparation.  The pharmacist that spoke with me during my hospital stay was:  Saundra Shelling, [facility]  Why was Eliquis prescribed for you? Eliquis was prescribed for you to reduce the risk of a blood clot forming that can cause a stroke if you have a medical condition called atrial fibrillation (a type of irregular heartbeat).  What do You need to know about Eliquis ? Take your Eliquis TWICE DAILY - one tablet in the morning and one tablet in the evening with or without food. If you have difficulty swallowing the tablet whole please discuss with your pharmacist how to take the medication safely.  Take Eliquis exactly as prescribed by your doctor and DO NOT stop taking Eliquis without talking to the doctor who prescribed the medication.  Stopping may increase your risk of developing a stroke.  Refill your prescription before you run out.  After discharge, you should have regular check-up appointments with your healthcare provider that is prescribing your Eliquis.  In the future your dose may need to be changed if your kidney function or weight changes by a significant amount or as you get older.  What do you do if you miss a dose? If you miss a dose, take it as soon as you remember on the same day and resume taking twice daily.  Do not take more than one dose of ELIQUIS at the same time  to make up a missed dose.  Important Safety Information A possible side  effect of Eliquis is bleeding. You should call your healthcare provider right away if you experience any of the following: ? Bleeding from an injury or your nose that does not stop. ? Unusual colored urine (red or dark brown) or unusual colored stools (red or black). ? Unusual bruising for unknown reasons. ? A serious fall or if you hit your head (even if there is no bleeding).  Some medicines may interact with Eliquis and might increase your risk of bleeding or clotting while on Eliquis. To help avoid this, consult your healthcare provider or pharmacist prior to using any new prescription or non-prescription medications, including herbals, vitamins, non-steroidal anti-inflammatory drugs (NSAIDs) and supplements.  This website has more information on Eliquis (apixaban): http://[email or web address].com/eliquis/home   Additional discharge instructions:   Please get your medications reviewed and adjusted by your Primary MD.  Please request your Primary MD to go over all Hospital Tests and Procedure/Radiological results at the follow up, please get all Hospital records sent to your Prim MD by signing hospital release before you go home.  If you had Pneumonia of Lung problems at the Hospital: Please get a 2 view Chest X ray done in 6-8 weeks after hospital discharge or sooner if instructed by your Primary MD.  If you have Congestive Heart Failure: Please call your Cardiologist or Primary MD anytime you have any of the following symptoms:  1) 3 pound weight gain in 24 hours or 5 pounds in 1 week  2) shortness of breath, with or without a dry hacking cough  3) swelling in the hands, feet or stomach  4) if you have to sleep on extra pillows at night in order to breathe  Follow cardiac low salt diet and 1.5 lit/day fluid restriction.  If you have diabetes Accuchecks 4 times/day, Once in AM empty stomach and then before each meal. Log in all results and show them to your primary doctor at your next  visit. If any glucose reading is under 80 or above 300 call your primary MD immediately.  If you have Seizure/Convulsions/Epilepsy: Please do not drive, operate heavy machinery, participate in activities at heights or participate in high speed sports until you have seen by Primary MD or a Neurologist and advised to do so again.  If you had Gastrointestinal Bleeding: Please ask your Primary MD to check a complete blood count within one week of discharge or at your next visit. Your endoscopic/colonoscopic biopsies that are pending at the time of discharge, will also need to followed by your Primary MD.  Get Medicines reviewed and adjusted. Please take all your medications with you for your next visit with your Primary MD  Please request your Primary MD to go over all hospital tests and procedure/radiological results at the follow up, please ask your Primary MD to get all Hospital records sent to his/her office.  If you experience worsening of your admission symptoms, develop shortness of breath, life threatening emergency, suicidal or homicidal thoughts you must seek medical attention immediately by calling 911 or calling your MD immediately  if symptoms less severe.  You must read complete instructions/literature along with all the possible adverse reactions/side effects for all the Medicines you take and that have been prescribed to you. Take any new Medicines after you have completely understood and accpet all the possible adverse reactions/side effects.   Do not drive or operate heavy machinery when taking Pain medications.  Do not take more than prescribed Pain, Sleep and Anxiety Medications  Special Instructions: If you have smoked or chewed Tobacco  in the last 2 yrs please stop smoking, stop any regular Alcohol  and or any Recreational drug use.  Wear Seat belts while driving.  Please note You were cared for by a hospitalist during your hospital stay. If you have any questions about  your discharge medications or the care you received while you were in the hospital after you are discharged, you can call the unit and asked to speak with the hospitalist on call if the hospitalist that took care of you is not available. Once you are discharged, your primary care physician will handle any further medical issues. Please note that NO REFILLS for any discharge medications will be authorized once you are discharged, as it is imperative that you return to your primary care physician (or establish a relationship with a primary care physician if you do not have one) for your aftercare needs so that they can reassess your need for medications and monitor your lab values.  You can reach the hospitalist office at phone [phone] or fax [phone]   If you do not have a primary care physician, you can call [phone] for a physician referral.

## 2017-02-27 DIAGNOSIS — S72002S Fracture of unspecified part of neck of left femur, sequela: Secondary | ICD-10-CM

## 2017-02-27 DIAGNOSIS — N189 Chronic kidney disease, unspecified: Secondary | ICD-10-CM

## 2017-02-27 DIAGNOSIS — I10 Essential (primary) hypertension: Secondary | ICD-10-CM

## 2017-02-27 DIAGNOSIS — D631 Anemia in chronic kidney disease: Secondary | ICD-10-CM

## 2017-02-27 LAB — CULTURE, BLOOD (ROUTINE X 2)
Culture: NO GROWTH
SPECIAL REQUESTS: ADEQUATE

## 2017-02-27 LAB — CBC
HEMATOCRIT: 29.6 % — AB (ref 39.0–52.0)
HEMOGLOBIN: 9.4 g/dL — AB (ref 13.0–17.0)
MCH: 25.5 pg — AB (ref 26.0–34.0)
MCHC: 31.8 g/dL (ref 30.0–36.0)
MCV: 80.4 fL (ref 78.0–100.0)
Platelets: 223 10*3/uL (ref 150–400)
RBC: 3.68 MIL/uL — ABNORMAL LOW (ref 4.22–5.81)
RDW: 16.5 % — ABNORMAL HIGH (ref 11.5–15.5)
WBC: 21.6 10*3/uL — ABNORMAL HIGH (ref 4.0–10.5)

## 2017-02-27 LAB — BASIC METABOLIC PANEL
Anion gap: 12 (ref 5–15)
BUN: 27 mg/dL — AB (ref 6–20)
CHLORIDE: 98 mmol/L — AB (ref 101–111)
CO2: 23 mmol/L (ref 22–32)
Calcium: 9.8 mg/dL (ref 8.9–10.3)
Creatinine, Ser: 1.25 mg/dL — ABNORMAL HIGH (ref 0.61–1.24)
GFR calc Af Amer: 60 mL/min — ABNORMAL LOW (ref >60)
GFR calc non Af Amer: 51 mL/min — ABNORMAL LOW (ref >60)
Glucose, Bld: 191 mg/dL — ABNORMAL HIGH (ref 65–99)
Potassium: 3.7 mmol/L (ref 3.5–5.1)
SODIUM: 133 mmol/L — AB (ref 135–145)

## 2017-02-27 LAB — GLUCOSE, CAPILLARY
GLUCOSE-CAPILLARY: 221 mg/dL — AB (ref 65–99)
GLUCOSE-CAPILLARY: 191 mg/dL — AB (ref 65–99)
GLUCOSE-CAPILLARY: 186 mg/dL — AB (ref 65–99)

## 2017-02-27 MED ORDER — LORAZEPAM 0.5 MG PO TABS: 0.5000 mg | ORAL_TABLET | Freq: Three times a day (TID) | ORAL | 0 refills | Status: DC | PRN

## 2017-02-27 MED ORDER — INSULIN ASPART 100 UNIT/ML ~~LOC~~ SOLN
0.0000 [IU] | Freq: Three times a day (TID) | SUBCUTANEOUS | Status: AC
Start: 2017-02-27 — End: ?

## 2017-02-27 MED ORDER — DOCUSATE SODIUM 100 MG PO CAPS: 100.0000 mg | ORAL_CAPSULE | Freq: Two times a day (BID) | ORAL | Status: AC

## 2017-02-27 MED ORDER — BISACODYL 10 MG RE SUPP: 10.0000 mg | Freq: Every day | RECTAL | Status: DC | PRN

## 2017-02-27 MED ORDER — GLUCERNA SHAKE PO LIQD: 237.0000 mL | Freq: Three times a day (TID) | ORAL | Status: DC

## 2017-02-27 MED ORDER — POTASSIUM CHLORIDE CRYS ER 20 MEQ PO TBCR: 20.0000 meq | EXTENDED_RELEASE_TABLET | Freq: Every day | ORAL | Status: DC | PRN

## 2017-02-27 MED ORDER — METOPROLOL SUCCINATE ER 25 MG PO TB24: 25.0000 mg | ORAL_TABLET | Freq: Every day | ORAL | Status: DC

## 2017-02-27 MED ORDER — SENNOSIDES-DOCUSATE SODIUM 8.6-50 MG PO TABS: 1.0000 | ORAL_TABLET | Freq: Every evening | ORAL | Status: AC | PRN

## 2017-02-27 NOTE — Progress Notes (Signed)
Pt discharged to [facility] via [facility] in stable condition. Report called and given to receiving facility nurse

## 2017-02-27 NOTE — Social Work (Addendum)
[facility] has received insurance authorization, MD paged for discharge summary.   CSW called daughter Velva Harman with update, she is amenable to pt being transferred this afternoon to [facility].   CSW continuing to follow to support discharge.   2:23pm- Clinical Social Worker facilitated patient discharge including contacting patient family and facility to confirm patient discharge plans.  Clinical information faxed to facility and family agreeable with plan.  CSW arranged ambulance transport via [facility] to [facility], Room [facility].  RN to call [phone] with report prior to discharge.  Clinical Social Worker will sign off for now as social work intervention is no longer needed. Please consult [place] again if new need arises.  Alexander Mt, LCSWA Clinical Social Worker [phone]

## 2017-02-27 NOTE — Discharge Summary (Signed)
Physician Discharge Summary  [name] WJX:[id number] DOB: [birth year]  PCP: [name], MD  Admit date: 02/22/2017 Discharge date: 02/27/2017  Recommendations for Outpatient Follow-up:  1. MD at SNF in 2 days with repeat labs (CBC & BMP). 2. Dr. [name], PCP upon discharge from SNF. 3. Dr. Rodell Perna, Orthopedics in 2 weeks for postop follow-up.  Home Health: None Equipment/Devices: None  Discharge Condition: Improved and stable CODE STATUS: Partial code (no intubation or mechanical ventilation). Diet recommendation: Heart healthy & diabetic diet.  Discharge Diagnoses:  Active Problems:   Essential hypertension   History of DVT (deep vein thrombosis)   Leukocytosis   Chronic back pain   Chronic diastolic CHF (congestive heart failure) (HCC)   Acute renal failure superimposed on stage 3 chronic kidney disease (HCC)   Atrial fibrillation with normal ventricular rate (HCC)   Pressure injury of skin   Type 2 diabetes mellitus (HCC)   COPD (chronic obstructive pulmonary disease) (HCC)   CAD (coronary artery disease)   UTI (urinary tract infection)   Closed left hip fracture (HCC)   GERD (gastroesophageal reflux disease)   CKD (chronic kidney disease), stage III (HCC)   Hyperglycemia   Anemia in chronic kidney disease (CKD)   Hypercalcemia   Moderate dehydration   Fall as cause of accidental injury at home as place of occurrence   S/P BKA (below knee amputation) unilateral, left (HCC)   Bilateral hip pain   Pyuria   Hyperbilirubinemia   Brief Summary: Patient is an 84 year old Caucasian male with past medical history significant for BPH, multivessel coronary artery disease,chronicback pain, type 2 diabetes mellitus, atrial fibrillation on Eliquis, hypertension, GERD and chronic kidney disease.  The patient is recently status post having a left below-knee amputation on 10/07/16 by Dr. Lorin Mercy. The patient had received a prosthesis 4 days prior to  presentation and he started using it without the assistance of physical therapy.  Patientfell and hurt his left hip and has had a difficult time being able to bear any weight since that time. He lived at home with a caregiver. Patient was brought to the emergency room by a friend who found him down at home on the floor. He had been on the floor for the entire night. He was unable to get up to get to a telephone or get any help.  Patient has been admitted with left hip fracture.  Status post left hip hemiarthroplasty 2/20.    Assessment & Plan:   Acute left femoral neck fracture -Orthopedics consulted and patient underwent left hip hemiarthroplasty (posterior arthroplasty) on 2/20.  As per orthopedics, recommend SNF at discharge. - Discussed with Dr. Lorin Mercy 2/21> OK to resume higher home dose of Eliquis from 2/22. -Stable.  Cleared for discharge to SNF by orthopedics.  Fall at home: -the patient was down for multiple hours on the floor.  -CPK  115.  No significant rhabdomyolysis.  SNF at discharge.  Moderate dehydration: - Resolved after IV fluids.  Anemia of chronic kidney disease: - Transfused 1 PRBC's at surgery.   - Hemoglobin has been stable in the 9-10 g per DL range.  Leukocytosis: -Unclear etiology.  No overt clinical focus of sepsis.  Not sure if this is chronic and intermittent, previous results reviewed.  He presented with WBC count of 19.4 which had normalized to 8.7 but then has increased to 12.9 > 21.6.?  Recent operative stress. -Follow CBCs closely at SNF.  Consider further evaluation if persistently rises or develops any  signs and symptoms concerning for infectious etiology.  Urinary tract infection: -patient had a Foley catheter removed in January/2018. He had marked pyuria and was started on empiric ceftriaxone IV. Blood cultures-see discussion below. Urine culture does not appear to have been sent.. Completed 4 days of IV ceftriaxone, discontinued 2/21.  1  of 4 blood cultures positive for gram-positive rods/diphtheroids - BCID did not detect anything.  As per discussion with pharmacy, likely contaminant.   -1 of those blood cultures was positive for diphtheroids.  Rest negative, final report.  Type II DM with hyperglycemia: -A1c 8 on 11/14/16.    Mildly uncontrolled and fluctuating in the hospital.  Continue home dose of Lantus at discharge and add NovoLog SSI.  Monitor closely and adjust as needed.  Hypercalcemia/hyperbilirubinemia: -Hypercalcemia resolved.  Isolated hyperbilirubinemia, mild, 1.7 in the absence of GI symptoms or other LFT abnormalities.?  Related to mild rhabdo.  Persistent atrial fibrillation: -heart rate is currently controlled. He has been anticoagulated with Eliquis. Eliquis was temporarily held pending surgery.   Resumed Eliquis after discussing with orthopedics.  Coronary artery disease: -stable no symptoms of chest pain. No EKG findings to suggest ischemia.  Essential hypertension: -Continue home blood pressure medications.  Controlled.  COPD: -Stable.  Chronic diastolic congestive heart failure: -Compensated at this time.  GERD: - Continue PPI  Chronic back pain: -Controlled.  Acute on chronic kidney disease stage III: -Patient had normal creatinine in November 2000 18/1.11.  Presented with creatinine of 1.9.  This is steadily improved to 1.25.  Avoid nephrotoxins.  Follow BMP closely.  Etiology unclear,?  Dehydration and mild rhabdomyolysis.    Consultants:   Orthopedic team.  Procedures:   Left hip hemiarthroplasty 2/20    Discharge Instructions  Discharge Instructions    (HEART FAILURE PATIENTS) Call MD:  Anytime you have any of the following symptoms: 1) 3 pound weight gain in 24 hours or 5 pounds in 1 week 2) shortness of breath, with or without a dry hacking cough 3) swelling in the hands, feet or stomach 4) if you have to sleep on extra pillows at night in order to  breathe.   Complete by:  As directed    Call MD for:  difficulty breathing, headache or visual disturbances   Complete by:  As directed    Call MD for:  extreme fatigue   Complete by:  As directed    Call MD for:  persistant dizziness or light-headedness   Complete by:  As directed    Call MD for:  persistant nausea and vomiting   Complete by:  As directed    Call MD for:  redness, tenderness, or signs of infection (pain, swelling, redness, odor or green/yellow discharge around incision site)   Complete by:  As directed    Call MD for:  severe uncontrolled pain   Complete by:  As directed    Call MD for:  temperature >100.4   Complete by:  As directed    Diet - low sodium heart healthy   Complete by:  As directed    Diet Carb Modified   Complete by:  As directed    Increase activity slowly   Complete by:  As directed        Medication List    STOP taking these medications   insulin lispro 100 UNIT/ML injection Commonly known as:  HUMALOG   oxyCODONE 15 MG immediate release tablet Commonly known as:  ROXICODONE     TAKE these medications  amLODipine 10 MG tablet Commonly known as:  NORVASC Take 1 tablet (10 mg total) by mouth daily.   bisacodyl 10 MG suppository Commonly known as:  DULCOLAX Place 1 suppository (10 mg total) rectally daily as needed for moderate constipation.   cholecalciferol 400 units Tabs tablet Commonly known as:  VITAMIN D Take 400 Units by mouth daily.   cloNIDine 0.1 MG tablet Commonly known as:  CATAPRES Take 0.1 mg by mouth 2 (two) times daily.   diphenhydrAMINE 25 MG tablet Commonly known as:  SOMINEX Take 25 mg by mouth at bedtime as needed for sleep.   docusate sodium 100 MG capsule Commonly known as:  COLACE Take 1 capsule (100 mg total) by mouth 2 (two) times daily.   ELIQUIS 5 MG Tabs tablet Generic drug:  apixaban Take 5 mg by mouth 2 (two) times daily.   feeding supplement (GLUCERNA SHAKE) Liqd Take 237 mLs by mouth 3  (three) times daily between meals.   FLUoxetine 10 MG tablet Commonly known as:  PROZAC Take 10 mg by mouth daily.   furosemide 40 MG tablet Commonly known as:  LASIX Take 1 tablet (40 mg total) daily as needed by mouth (weight gain of 3 or more pounds in 24-48 hour period).   HYDROcodone-acetaminophen 7.5-325 MG tablet Commonly known as:  NORCO Take 1 tablet by mouth every 6 (six) hours as needed for moderate pain ((score 4 to 6)).   insulin aspart 100 UNIT/ML injection Commonly known as:  novoLOG Inject 0-9 Units into the skin 3 (three) times daily with meals. CBG < 70: implement hypoglycemia protocol CBG 70 - 120: 0 units CBG 121 - 150: 1 unit CBG 151 - 200: 2 units CBG 201 - 250: 3 units CBG 251 - 300: 5 units CBG 301 - 350: 7 units CBG 351 - 400: 9 units CBG > 400: call MD.   insulin glargine 100 UNIT/ML injection Commonly known as:  LANTUS Inject 0.15 mLs (15 Units total) at bedtime into the skin.   ipratropium-albuterol 0.5-2.5 (3) MG/3ML Soln Commonly known as:  DUONEB Take 3 mLs by nebulization every 6 (six) hours as needed.   LORazepam 0.5 MG tablet Commonly known as:  ATIVAN Take 1 tablet (0.5 mg total) by mouth 3 (three) times daily as needed for anxiety. What changed:    when to take this  reasons to take this   metoprolol succinate 25 MG 24 hr tablet Commonly known as:  TOPROL-XL Take 1 tablet (25 mg total) by mouth daily. Take with or immediately following a meal. What changed:  how much to take   nitroGLYCERIN 0.4 MG SL tablet Commonly known as:  NITROSTAT Place 1 tablet (0.4 mg total) under the tongue every 5 (five) minutes as needed for chest pain.   pantoprazole 40 MG tablet Commonly known as:  PROTONIX Take 40 mg by mouth daily.   polyethylene glycol packet Commonly known as:  MIRALAX / GLYCOLAX Take 17 g daily by mouth.   potassium chloride SA 20 MEQ tablet Commonly known as:  K-DUR,KLOR-CON Take 1 tablet (20 mEq total) by mouth daily  as needed (with each dose of lasix (furosemide)). Take when Lasix is needed. What changed:  reasons to take this   PROSCAR 5 MG tablet Generic drug:  finasteride Take 5 mg daily by mouth.   senna-docusate 8.6-50 MG tablet Commonly known as:  Senokot-S Take 1 tablet by mouth at bedtime as needed for mild constipation.   tamsulosin 0.4 MG Caps capsule  Commonly known as:  FLOMAX Take 0.4 mg by mouth daily.       Contact information for follow-up providers    [name], MD. Schedule an appointment as soon as possible for a visit today.   Specialty:  Orthopedic Surgery Why:  need return office visit with Dr Lorin Mercy 2 weeks postop.  Contact information: [place] [address] [phone]        MD at SNF. Schedule an appointment as soon as possible for a visit in 2 day(s).   Why:  To be seen with repeat labs (CBC & BMP).       [name], MD. Schedule an appointment as soon as possible for a visit.   Specialty:  Internal Medicine Why:  Upon discharge from SNF. Contact information: [place] [address] [phone]            Contact information for after-discharge care    [facility] Follow up.   Service:  Skilled Nursing Contact information: [address] [place] [phone]                 No Known Allergies    Procedures/Studies: Dg Chest 1 View  Result Date: 02/22/2017 CLINICAL DATA:  Left hip pain status post fall. History of COPD and hypertension. EXAM: CHEST 1 VIEW COMPARISON:  Radiographs 11/13/2016 and 11/17/2016. FINDINGS: 1129 hr. Stable cardiomegaly status post median sternotomy and CABG. There is aortic atherosclerosis. Persistent low lung volumes with vascular congestion. No overt pulmonary edema, confluent airspace opacity or significant pleural effusion. IMPRESSION: Cardiomegaly and vascular congestion. Electronically Signed   By:  Richardean Sale M.D.   On: 02/22/2017 12:01   Dg Hip Port Unilat With Pelvis 1v Left  Result Date: 02/26/2017 CLINICAL DATA:  Postop left hip arthroplasty. EXAM: DG HIP (WITH OR WITHOUT PELVIS) 1V PORT LEFT COMPARISON:  02/22/2017 FINDINGS: Sequelae of interval left hip hemiarthroplasty are identified. The femoral prosthesis is approximated with the native acetabulum. Postoperative gas is noted in the adjacent soft tissues, and skin staples are in place. Atherosclerotic vascular calcifications are noted. IMPRESSION: Interval left hip hemiarthroplasty without evidence of acute complication. Electronically Signed   By: Logan Bores M.D.   On: 02/26/2017 15:29   Dg Hip Unilat W Or Wo Pelvis 2-3 Views Left  Result Date: 02/22/2017 CLINICAL DATA:  Left hip pain after fall.  Initial encounter. EXAM: DG HIP (WITH OR WITHOUT PELVIS) 2-3V LEFT COMPARISON:  None. FINDINGS: Acute left femoral neck fracture with mild to moderate displacement. No evidence of pelvic ring fracture or diastasis. Osteopenia and atherosclerosis. IMPRESSION: Displaced left femoral neck fracture. Electronically Signed   By: Monte Fantasia M.D.   On: 02/22/2017 11:54      Subjective: Reports mild to moderate left hip pain.  Denies headache, sore throat, earache, cough, dyspnea, chest pain, nausea, vomiting, abdominal pain, dysuria or urinary frequency.  Passing flatus.  No BM in couple days.  As per RN, no acute issues reported.  Discharge Exam:  Vitals:   02/26/17 1438 02/26/17 2241 02/27/17 0541 02/27/17 0831  BP: 133/69 (!) 131/53 128/66 (!) 160/72  Pulse: 84 73 82 83  Resp: 16 17 19 19   Temp:  99.7 F (37.6 C) 98.3 F (36.8 C) 98.4 F (36.9 C)  TempSrc:  Oral  Oral  SpO2: 98% 94% 97% 97%  Weight:      Height:  General exam: Pleasant elderly male, moderately built and nourished lying comfortably supine in bed.  Does not appear in any distress. Respiratory system: Clear to auscultation. Respiratory effort  normal.  Sternotomy scar.  Cardiovascular system: S1-S2 heard, RRR.  No JVD, murmurs or pedal edema.   Gastrointestinal system: Abdomen is nondistended, soft and nontender. No organomegaly or masses felt. Normal bowel sounds heard.  Central nervous system: Alert and oriented x2. No focal neurological deficits. Extremities: Symmetric 5 x 5 power.  Left BKA, healed stump.  Left hip postop site clean and dry without acute findings. Psychiatry: Judgement and insight appear impaired. Mood & affect flat.       The results of significant diagnostics from this hospitalization (including imaging, microbiology, ancillary and laboratory) are listed below for reference.     Microbiology: Recent Results (from the past 240 hour(s))  Culture, blood (Routine X 2) w Reflex to ID Panel     Status: Abnormal   Collection Time: 02/22/17  2:44 PM  Result Value Ref Range Status   Specimen Description   Final    RIGHT ANTECUBITAL Performed at [facility], [address] [name], [place] [address]    Special Requests   Final    BOTTLES DRAWN AEROBIC AND ANAEROBIC Blood Culture adequate volume Performed at [facility], [address], [place], [place] [address]    Culture  Setup Time   Final    GRAM POSITIVE RODS AEROBIC BOTTLE ONLY Gram Stain Report Called to,Read Back By and Verified With: Marya Landry, RN @ [facility] @ 0406 ON 2.20.19 BY L.BOWMAN CRITICAL RESULT CALLED TO, READ BACK BY AND VERIFIED WITH: Josefine Class [id number] MLM Performed at [facility], [place] [address] [place], [place] [address]    Culture DIPHTHEROIDS(CORYNEBACTERIUM SPECIES) (A)  Final   Report Status 02/25/2017 FINAL  Final  Blood Culture ID Panel (Reflexed)     Status: None   Collection Time: 02/22/17  2:44 PM  Result Value Ref Range Status   Enterococcus species NOT DETECTED NOT DETECTED Final   Listeria monocytogenes NOT DETECTED NOT DETECTED Final   Staphylococcus species NOT DETECTED NOT DETECTED Final    Staphylococcus aureus NOT DETECTED NOT DETECTED Final   Streptococcus species NOT DETECTED NOT DETECTED Final   Streptococcus agalactiae NOT DETECTED NOT DETECTED Final   Streptococcus pneumoniae NOT DETECTED NOT DETECTED Final   Streptococcus pyogenes NOT DETECTED NOT DETECTED Final   Acinetobacter baumannii NOT DETECTED NOT DETECTED Final   Enterobacteriaceae species NOT DETECTED NOT DETECTED Final   Enterobacter cloacae complex NOT DETECTED NOT DETECTED Final   Escherichia coli NOT DETECTED NOT DETECTED Final   Klebsiella oxytoca NOT DETECTED NOT DETECTED Final   Klebsiella pneumoniae NOT DETECTED NOT DETECTED Final   Proteus species NOT DETECTED NOT DETECTED Final   Serratia marcescens NOT DETECTED NOT DETECTED Final   Haemophilus influenzae NOT DETECTED NOT DETECTED Final   Neisseria meningitidis NOT DETECTED NOT DETECTED Final   Pseudomonas aeruginosa NOT DETECTED NOT DETECTED Final   Candida albicans NOT DETECTED NOT DETECTED Final   Candida glabrata NOT DETECTED NOT DETECTED Final   Candida krusei NOT DETECTED NOT DETECTED Final   Candida parapsilosis NOT DETECTED NOT DETECTED Final   Candida tropicalis NOT DETECTED NOT DETECTED Final    Comment: Performed at [facility], [place] [address], [place], [place] [address]  Culture, blood (Routine X 2) w Reflex to ID Panel     Status: None   Collection Time: 02/22/17  2:46 PM  Result Value Ref Range Status   Specimen Description LEFT ANTECUBITAL  Final   Special Requests   Final    BOTTLES DRAWN AEROBIC AND ANAEROBIC Blood Culture adequate volume   Culture   Final    NO GROWTH 5 DAYS Performed at [facility], [address], [place], [place] [address]    Report Status 02/27/2017 FINAL  Final  Surgical pcr screen     Status: Abnormal   Collection Time: 02/24/17  4:48 AM  Result Value Ref Range Status   MRSA, PCR POSITIVE (A) NEGATIVE Final    Comment: RESULT CALLED TO, READ BACK BY AND VERIFIED WITH: C BULIAA RN 02/24/17  0649 JDW    Staphylococcus aureus POSITIVE (A) NEGATIVE Final    Comment: RESULT CALLED TO, READ BACK BY AND VERIFIED WITH: COren Binet RN 02/24/17 [phone] JDW (NOTE) The Xpert SA Assay (FDA approved for NASAL specimens in patients 81 years of age and older), is one component of a comprehensive surveillance program. It is not intended to diagnose infection nor to guide or monitor treatment. Performed at [facility], [place] [address], [place], [place] [address]      Labs: CBC: Recent Labs  Lab 02/22/17 1217  02/23/17 0452 02/24/17 0810 02/25/17 0528 02/26/17 0717 02/27/17 0541  WBC 19.4*   < > 13.0* 11.0* 8.7 12.9* 21.6*  NEUTROABS 18.2*  --   --   --   --   --   --   HGB 10.5*   < > 9.0* 9.3* 10.2* 10.1* 9.4*  HCT 31.9*   < > 27.7* 29.4* 32.0* 30.9* 29.6*  MCV 77.4*   < > 78.7 79.5 80.2 81.3 80.4  PLT 169   < > 152 203 180 222 223   < > = values in this interval not displayed.   Basic Metabolic Panel: Recent Labs  Lab 02/23/17 0452 02/24/17 0810 02/25/17 0528 02/26/17 0717 02/27/17 0541  NA 135 135 133* 133* 133*  K 3.5 3.7 4.6 3.6 3.7  CL 101 99* 99* 99* 98*  CO2 22 23 21* 22 23  GLUCOSE 193* 199* 289* 205* 191*  BUN 20 17 25* 30* 27*  CREATININE 1.48* 1.35* 1.39* 1.45* 1.25*  CALCIUM 10.1 10.0 9.7 10.0 9.8   Liver Function Tests: Recent Labs  Lab 02/22/17 1217  AST 20  ALT 11*  ALKPHOS 73  BILITOT 1.7*  PROT 6.8  ALBUMIN 3.6    Cardiac Enzymes: Recent Labs  Lab 02/22/17 1217 02/23/17 0452  CKTOTAL 415* 115   CBG: Recent Labs  Lab 02/26/17 1221 02/26/17 1711 02/26/17 2223 02/27/17 0732 02/27/17 1141  GLUCAP 258* 231* 221* 191* 186*    Urinalysis    Component Value Date/Time   COLORURINE YELLOW 02/22/2017 1333   APPEARANCEUR HAZY (A) 02/22/2017 1333   APPEARANCEUR Cloudy (A) 11/12/2016 1101   LABSPEC 1.012 02/22/2017 1333   PHURINE 5.0 02/22/2017 1333   GLUCOSEU 50 (A) 02/22/2017 1333   HGBUR MODERATE (A) 02/22/2017 1333    BILIRUBINUR NEGATIVE 02/22/2017 1333   BILIRUBINUR Negative 11/12/2016 1101   KETONESUR 5 (A) 02/22/2017 1333   PROTEINUR 100 (A) 02/22/2017 1333   UROBILINOGEN 0.2 12/19/2013 1905   NITRITE NEGATIVE 02/22/2017 1333   LEUKOCYTESUR LARGE (A) 02/22/2017 1333   LEUKOCYTESUR 2+ (A) 11/12/2016 1101    Discussed in detail with patient's daughter via phone.  Updated care and answered questions.  Time coordinating discharge: Over 30 minutes  SIGNED:  Vernell Leep, MD,  FACP, FHM. [facility] Pager [phone]  If 7PM-7AM, please contact night-coverage [email or web address] Password [facility] 02/27/2017, 2:07 PM

## 2017-03-04 ENCOUNTER — Emergency Department (HOSPITAL_COMMUNITY): Payer: Medicare Other

## 2017-03-04 ENCOUNTER — Inpatient Hospital Stay (HOSPITAL_COMMUNITY): Payer: Medicare Other

## 2017-03-04 ENCOUNTER — Encounter (HOSPITAL_COMMUNITY): Payer: Self-pay

## 2017-03-04 ENCOUNTER — Other Ambulatory Visit: Payer: Self-pay

## 2017-03-04 ENCOUNTER — Inpatient Hospital Stay (HOSPITAL_COMMUNITY)
Admission: EM | Admit: 2017-03-04 | Discharge: 2017-03-19 | DRG: 500 | Disposition: A | Discharge status: Skilled Nursing Facility | Payer: Medicare Other | Attending: Internal Medicine | Admitting: Internal Medicine

## 2017-03-04 DIAGNOSIS — N390 Urinary tract infection, site not specified: Secondary | ICD-10-CM | POA: Diagnosis not present

## 2017-03-04 DIAGNOSIS — A4102 Sepsis due to Methicillin resistant Staphylococcus aureus: Secondary | ICD-10-CM | POA: Diagnosis present

## 2017-03-04 DIAGNOSIS — R7881 Bacteremia: Secondary | ICD-10-CM | POA: Diagnosis not present

## 2017-03-04 DIAGNOSIS — Z7901 Long term (current) use of anticoagulants: Secondary | ICD-10-CM | POA: Diagnosis not present

## 2017-03-04 DIAGNOSIS — T8452XA Infection and inflammatory reaction due to internal left hip prosthesis, initial encounter: Principal | ICD-10-CM | POA: Diagnosis present

## 2017-03-04 DIAGNOSIS — D631 Anemia in chronic kidney disease: Secondary | ICD-10-CM | POA: Diagnosis present

## 2017-03-04 DIAGNOSIS — E860 Dehydration: Secondary | ICD-10-CM | POA: Diagnosis not present

## 2017-03-04 DIAGNOSIS — Z66 Do not resuscitate: Secondary | ICD-10-CM | POA: Diagnosis present

## 2017-03-04 DIAGNOSIS — T8451XA Infection and inflammatory reaction due to internal right hip prosthesis, initial encounter: Secondary | ICD-10-CM | POA: Diagnosis not present

## 2017-03-04 DIAGNOSIS — H9190 Unspecified hearing loss, unspecified ear: Secondary | ICD-10-CM | POA: Diagnosis not present

## 2017-03-04 DIAGNOSIS — Z978 Presence of other specified devices: Secondary | ICD-10-CM | POA: Diagnosis not present

## 2017-03-04 DIAGNOSIS — Z9889 Other specified postprocedural states: Secondary | ICD-10-CM

## 2017-03-04 DIAGNOSIS — E46 Unspecified protein-calorie malnutrition: Secondary | ICD-10-CM | POA: Diagnosis present

## 2017-03-04 DIAGNOSIS — Z7189 Other specified counseling: Secondary | ICD-10-CM

## 2017-03-04 DIAGNOSIS — L97519 Non-pressure chronic ulcer of other part of right foot with unspecified severity: Secondary | ICD-10-CM | POA: Diagnosis not present

## 2017-03-04 DIAGNOSIS — R410 Disorientation, unspecified: Secondary | ICD-10-CM | POA: Diagnosis present

## 2017-03-04 DIAGNOSIS — M00052 Staphylococcal arthritis, left hip: Secondary | ICD-10-CM | POA: Diagnosis not present

## 2017-03-04 DIAGNOSIS — R109 Unspecified abdominal pain: Secondary | ICD-10-CM | POA: Diagnosis not present

## 2017-03-04 DIAGNOSIS — E785 Hyperlipidemia, unspecified: Secondary | ICD-10-CM | POA: Diagnosis present

## 2017-03-04 DIAGNOSIS — R630 Anorexia: Secondary | ICD-10-CM | POA: Diagnosis not present

## 2017-03-04 DIAGNOSIS — T8140XA Infection following a procedure, unspecified, initial encounter: Secondary | ICD-10-CM | POA: Diagnosis not present

## 2017-03-04 DIAGNOSIS — G4730 Sleep apnea, unspecified: Secondary | ICD-10-CM | POA: Diagnosis present

## 2017-03-04 DIAGNOSIS — Z89512 Acquired absence of left leg below knee: Secondary | ICD-10-CM | POA: Diagnosis not present

## 2017-03-04 DIAGNOSIS — I130 Hypertensive heart and chronic kidney disease with heart failure and stage 1 through stage 4 chronic kidney disease, or unspecified chronic kidney disease: Secondary | ICD-10-CM | POA: Diagnosis present

## 2017-03-04 DIAGNOSIS — I499 Cardiac arrhythmia, unspecified: Secondary | ICD-10-CM | POA: Diagnosis not present

## 2017-03-04 DIAGNOSIS — Y831 Surgical operation with implant of artificial internal device as the cause of abnormal reaction of the patient, or of later complication, without mention of misadventure at the time of the procedure: Secondary | ICD-10-CM | POA: Diagnosis present

## 2017-03-04 DIAGNOSIS — F419 Anxiety disorder, unspecified: Secondary | ICD-10-CM | POA: Diagnosis present

## 2017-03-04 DIAGNOSIS — I2510 Atherosclerotic heart disease of native coronary artery without angina pectoris: Secondary | ICD-10-CM | POA: Diagnosis present

## 2017-03-04 DIAGNOSIS — G8929 Other chronic pain: Secondary | ICD-10-CM | POA: Diagnosis present

## 2017-03-04 DIAGNOSIS — Z7951 Long term (current) use of inhaled steroids: Secondary | ICD-10-CM

## 2017-03-04 DIAGNOSIS — E1122 Type 2 diabetes mellitus with diabetic chronic kidney disease: Secondary | ICD-10-CM | POA: Diagnosis present

## 2017-03-04 DIAGNOSIS — N400 Enlarged prostate without lower urinary tract symptoms: Secondary | ICD-10-CM | POA: Diagnosis present

## 2017-03-04 DIAGNOSIS — Z01818 Encounter for other preprocedural examination: Secondary | ICD-10-CM

## 2017-03-04 DIAGNOSIS — T8142XA Infection following a procedure, deep incisional surgical site, initial encounter: Secondary | ICD-10-CM | POA: Diagnosis present

## 2017-03-04 DIAGNOSIS — N183 Chronic kidney disease, stage 3 unspecified: Secondary | ICD-10-CM | POA: Diagnosis present

## 2017-03-04 DIAGNOSIS — S72002A Fracture of unspecified part of neck of left femur, initial encounter for closed fracture: Secondary | ICD-10-CM

## 2017-03-04 DIAGNOSIS — Z86718 Personal history of other venous thrombosis and embolism: Secondary | ICD-10-CM

## 2017-03-04 DIAGNOSIS — Z79899 Other long term (current) drug therapy: Secondary | ICD-10-CM | POA: Diagnosis not present

## 2017-03-04 DIAGNOSIS — M25552 Pain in left hip: Secondary | ICD-10-CM | POA: Diagnosis present

## 2017-03-04 DIAGNOSIS — G9341 Metabolic encephalopathy: Secondary | ICD-10-CM | POA: Diagnosis present

## 2017-03-04 DIAGNOSIS — N189 Chronic kidney disease, unspecified: Secondary | ICD-10-CM | POA: Diagnosis not present

## 2017-03-04 DIAGNOSIS — A419 Sepsis, unspecified organism: Secondary | ICD-10-CM | POA: Diagnosis not present

## 2017-03-04 DIAGNOSIS — F0390 Unspecified dementia without behavioral disturbance: Secondary | ICD-10-CM | POA: Diagnosis present

## 2017-03-04 DIAGNOSIS — Z96642 Presence of left artificial hip joint: Secondary | ICD-10-CM | POA: Diagnosis present

## 2017-03-04 DIAGNOSIS — D72829 Elevated white blood cell count, unspecified: Secondary | ICD-10-CM | POA: Diagnosis present

## 2017-03-04 DIAGNOSIS — I340 Nonrheumatic mitral (valve) insufficiency: Secondary | ICD-10-CM | POA: Diagnosis not present

## 2017-03-04 DIAGNOSIS — R509 Fever, unspecified: Secondary | ICD-10-CM

## 2017-03-04 DIAGNOSIS — I481 Persistent atrial fibrillation: Secondary | ICD-10-CM | POA: Diagnosis present

## 2017-03-04 DIAGNOSIS — Z95828 Presence of other vascular implants and grafts: Secondary | ICD-10-CM | POA: Diagnosis not present

## 2017-03-04 DIAGNOSIS — K219 Gastro-esophageal reflux disease without esophagitis: Secondary | ICD-10-CM | POA: Diagnosis present

## 2017-03-04 DIAGNOSIS — I4891 Unspecified atrial fibrillation: Secondary | ICD-10-CM | POA: Diagnosis present

## 2017-03-04 DIAGNOSIS — Z515 Encounter for palliative care: Secondary | ICD-10-CM | POA: Diagnosis not present

## 2017-03-04 DIAGNOSIS — I5032 Chronic diastolic (congestive) heart failure: Secondary | ICD-10-CM | POA: Diagnosis present

## 2017-03-04 DIAGNOSIS — E876 Hypokalemia: Secondary | ICD-10-CM | POA: Diagnosis present

## 2017-03-04 DIAGNOSIS — T8149XA Infection following a procedure, other surgical site, initial encounter: Secondary | ICD-10-CM | POA: Diagnosis not present

## 2017-03-04 DIAGNOSIS — K5900 Constipation, unspecified: Secondary | ICD-10-CM | POA: Diagnosis present

## 2017-03-04 DIAGNOSIS — E1165 Type 2 diabetes mellitus with hyperglycemia: Secondary | ICD-10-CM | POA: Diagnosis present

## 2017-03-04 DIAGNOSIS — I482 Chronic atrial fibrillation: Secondary | ICD-10-CM | POA: Diagnosis present

## 2017-03-04 DIAGNOSIS — Z5181 Encounter for therapeutic drug level monitoring: Secondary | ICD-10-CM | POA: Diagnosis not present

## 2017-03-04 DIAGNOSIS — B9562 Methicillin resistant Staphylococcus aureus infection as the cause of diseases classified elsewhere: Secondary | ICD-10-CM | POA: Diagnosis not present

## 2017-03-04 DIAGNOSIS — Z87891 Personal history of nicotine dependence: Secondary | ICD-10-CM

## 2017-03-04 DIAGNOSIS — Z951 Presence of aortocoronary bypass graft: Secondary | ICD-10-CM

## 2017-03-04 DIAGNOSIS — J449 Chronic obstructive pulmonary disease, unspecified: Secondary | ICD-10-CM | POA: Diagnosis present

## 2017-03-04 DIAGNOSIS — E872 Acidosis: Secondary | ICD-10-CM | POA: Diagnosis present

## 2017-03-04 DIAGNOSIS — F329 Major depressive disorder, single episode, unspecified: Secondary | ICD-10-CM | POA: Diagnosis present

## 2017-03-04 DIAGNOSIS — Z4659 Encounter for fitting and adjustment of other gastrointestinal appliance and device: Secondary | ICD-10-CM

## 2017-03-04 DIAGNOSIS — N179 Acute kidney failure, unspecified: Secondary | ICD-10-CM | POA: Diagnosis present

## 2017-03-04 DIAGNOSIS — Z794 Long term (current) use of insulin: Secondary | ICD-10-CM

## 2017-03-04 DIAGNOSIS — R0602 Shortness of breath: Secondary | ICD-10-CM

## 2017-03-04 DIAGNOSIS — R262 Difficulty in walking, not elsewhere classified: Secondary | ICD-10-CM | POA: Diagnosis not present

## 2017-03-04 DIAGNOSIS — Z96641 Presence of right artificial hip joint: Secondary | ICD-10-CM | POA: Diagnosis not present

## 2017-03-04 LAB — URINALYSIS, ROUTINE W REFLEX MICROSCOPIC
Bacteria, UA: NONE SEEN
Bilirubin Urine: NEGATIVE
Glucose, UA: 50 mg/dL — AB
KETONES UR: 5 mg/dL — AB
Leukocytes, UA: NEGATIVE
Nitrite: NEGATIVE
Protein, ur: 100 mg/dL — AB
SPECIFIC GRAVITY, URINE: 1.012 (ref 1.005–1.030)
pH: 5 (ref 5.0–8.0)

## 2017-03-04 LAB — CBC WITH DIFFERENTIAL/PLATELET
BASOS ABS: 0 10*3/uL (ref 0.0–0.1)
Basophils Relative: 0 %
EOS ABS: 0 10*3/uL (ref 0.0–0.7)
Eosinophils Relative: 0 %
HCT: 30.8 % — ABNORMAL LOW (ref 39.0–52.0)
Hemoglobin: 9.5 g/dL — ABNORMAL LOW (ref 13.0–17.0)
LYMPHS ABS: 0.3 10*3/uL — AB (ref 0.7–4.0)
Lymphocytes Relative: 1 %
MCH: 25.3 pg — AB (ref 26.0–34.0)
MCHC: 30.8 g/dL (ref 30.0–36.0)
MCV: 81.9 fL (ref 78.0–100.0)
MONO ABS: 1.7 10*3/uL — AB (ref 0.1–1.0)
Monocytes Relative: 5 %
NEUTROS ABS: 32 10*3/uL — AB (ref 1.7–7.7)
Neutrophils Relative %: 94 %
PLATELETS: 407 10*3/uL — AB (ref 150–400)
RBC: 3.76 MIL/uL — ABNORMAL LOW (ref 4.22–5.81)
RDW: 16.9 % — AB (ref 11.5–15.5)
WBC Morphology: INCREASED
WBC: 34 10*3/uL — ABNORMAL HIGH (ref 4.0–10.5)

## 2017-03-04 LAB — COMPREHENSIVE METABOLIC PANEL
ALBUMIN: 2.6 g/dL — AB (ref 3.5–5.0)
ALK PHOS: 155 U/L — AB (ref 38–126)
ALT: 14 U/L — AB (ref 17–63)
AST: 20 U/L (ref 15–41)
Anion gap: 15 (ref 5–15)
BILIRUBIN TOTAL: 1.7 mg/dL — AB (ref 0.3–1.2)
BUN: 33 mg/dL — ABNORMAL HIGH (ref 6–20)
CALCIUM: 10.7 mg/dL — AB (ref 8.9–10.3)
CO2: 22 mmol/L (ref 22–32)
CREATININE: 1.41 mg/dL — AB (ref 0.61–1.24)
Chloride: 94 mmol/L — ABNORMAL LOW (ref 101–111)
GFR calc non Af Amer: 44 mL/min — ABNORMAL LOW (ref >60)
GFR, EST AFRICAN AMERICAN: 52 mL/min — AB (ref >60)
GLUCOSE: 212 mg/dL — AB (ref 65–99)
Potassium: 4 mmol/L (ref 3.5–5.1)
SODIUM: 131 mmol/L — AB (ref 135–145)
TOTAL PROTEIN: 6.6 g/dL (ref 6.5–8.1)

## 2017-03-04 LAB — LACTIC ACID, PLASMA: Lactic Acid, Venous: 1 mmol/L (ref 0.5–1.9)

## 2017-03-04 LAB — I-STAT CG4 LACTIC ACID, ED: Lactic Acid, Venous: 2.43 mmol/L (ref 0.5–1.9)

## 2017-03-04 MED ORDER — IOPAMIDOL (ISOVUE-300) INJECTION 61%
100.0000 mL | Freq: Once | INTRAVENOUS | Status: AC | PRN
  Administered 2017-03-04: 100 mL via INTRAVENOUS

## 2017-03-04 MED ORDER — HYDROCODONE-ACETAMINOPHEN 5-325 MG PO TABS
ORAL_TABLET | ORAL | Status: AC
  Administered 2017-03-04: 1.5
  Filled 2017-03-04: qty 2

## 2017-03-04 MED ORDER — FENTANYL CITRATE (PF) 100 MCG/2ML IJ SOLN
25.0000 ug | INTRAMUSCULAR | Status: AC | PRN
  Administered 2017-03-04 – 2017-03-06 (×4): 25 ug via INTRAVENOUS
  Filled 2017-03-04 (×4): qty 2

## 2017-03-04 MED ORDER — CHLORHEXIDINE GLUCONATE 4 % EX LIQD
60.0000 mL | Freq: Once | CUTANEOUS | Status: AC
  Administered 2017-03-05: 4 via TOPICAL
  Filled 2017-03-04: qty 60

## 2017-03-04 MED ORDER — PIPERACILLIN-TAZOBACTAM 3.375 G IVPB
3.3750 g | Freq: Three times a day (TID) | INTRAVENOUS | Status: DC
  Administered 2017-03-04 – 2017-03-05 (×4): 3.375 g via INTRAVENOUS
  Filled 2017-03-04 (×4): qty 50

## 2017-03-04 MED ORDER — ACETAMINOPHEN 325 MG PO TABS
650.0000 mg | ORAL_TABLET | Freq: Four times a day (QID) | ORAL | Status: DC | PRN
  Administered 2017-03-04 – 2017-03-08 (×2): 650 mg via ORAL
  Administered 2017-03-09: 325 mg via ORAL
  Administered 2017-03-12 – 2017-03-16 (×4): 650 mg via ORAL
  Filled 2017-03-04 (×8): qty 2

## 2017-03-04 MED ORDER — NITROGLYCERIN 0.4 MG SL SUBL: 0.4000 mg | SUBLINGUAL_TABLET | SUBLINGUAL | Status: DC | PRN

## 2017-03-04 MED ORDER — PANTOPRAZOLE SODIUM 40 MG PO TBEC
40.0000 mg | DELAYED_RELEASE_TABLET | Freq: Every day | ORAL | Status: DC
  Administered 2017-03-04 – 2017-03-19 (×15): 40 mg via ORAL
  Filled 2017-03-04 (×15): qty 1

## 2017-03-04 MED ORDER — SENNOSIDES-DOCUSATE SODIUM 8.6-50 MG PO TABS: 1.0000 | ORAL_TABLET | Freq: Every evening | ORAL | Status: DC | PRN

## 2017-03-04 MED ORDER — PIPERACILLIN-TAZOBACTAM 3.375 G IVPB 30 MIN
3.3750 g | Freq: Once | INTRAVENOUS | Status: AC
  Administered 2017-03-04: 3.375 g via INTRAVENOUS
  Filled 2017-03-04: qty 50

## 2017-03-04 MED ORDER — SODIUM CHLORIDE 0.9 % IV SOLN
INTRAVENOUS | Status: DC
  Administered 2017-03-04 – 2017-03-07 (×4): via INTRAVENOUS

## 2017-03-04 MED ORDER — TAMSULOSIN HCL 0.4 MG PO CAPS
0.4000 mg | ORAL_CAPSULE | Freq: Every day | ORAL | Status: DC
  Administered 2017-03-04 – 2017-03-19 (×15): 0.4 mg via ORAL
  Filled 2017-03-04 (×15): qty 1

## 2017-03-04 MED ORDER — VANCOMYCIN HCL IN DEXTROSE 750-5 MG/150ML-% IV SOLN
750.0000 mg | INTRAVENOUS | Status: DC
  Administered 2017-03-05: 750 mg via INTRAVENOUS
  Filled 2017-03-04 (×2): qty 150

## 2017-03-04 MED ORDER — INSULIN GLARGINE 100 UNIT/ML ~~LOC~~ SOLN
10.0000 [IU] | Freq: Every day | SUBCUTANEOUS | Status: DC
  Administered 2017-03-06 – 2017-03-19 (×15): 10 [IU] via SUBCUTANEOUS
  Filled 2017-03-04 (×16): qty 0.1

## 2017-03-04 MED ORDER — HYDROCODONE-ACETAMINOPHEN 7.5-325 MG PO TABS
1.0000 | ORAL_TABLET | Freq: Four times a day (QID) | ORAL | Status: DC | PRN
  Administered 2017-03-05 – 2017-03-17 (×26): 1 via ORAL
  Filled 2017-03-04 (×26): qty 1

## 2017-03-04 MED ORDER — ONDANSETRON HCL 4 MG PO TABS: 4.0000 mg | ORAL_TABLET | Freq: Four times a day (QID) | ORAL | Status: DC | PRN

## 2017-03-04 MED ORDER — ACETAMINOPHEN 325 MG PO TABS
650.0000 mg | ORAL_TABLET | Freq: Once | ORAL | Status: AC
  Administered 2017-03-04: 650 mg via ORAL
  Filled 2017-03-04: qty 2

## 2017-03-04 MED ORDER — SODIUM CHLORIDE 0.9 % IV BOLUS (SEPSIS)
1000.0000 mL | Freq: Once | INTRAVENOUS | Status: AC
  Administered 2017-03-04: 1000 mL via INTRAVENOUS

## 2017-03-04 MED ORDER — IPRATROPIUM-ALBUTEROL 0.5-2.5 (3) MG/3ML IN SOLN: 3.0000 mL | Freq: Four times a day (QID) | RESPIRATORY_TRACT | Status: DC | PRN

## 2017-03-04 MED ORDER — METOPROLOL SUCCINATE ER 25 MG PO TB24
25.0000 mg | ORAL_TABLET | Freq: Every day | ORAL | Status: DC
  Administered 2017-03-05 – 2017-03-12 (×8): 25 mg via ORAL
  Filled 2017-03-04 (×9): qty 1

## 2017-03-04 MED ORDER — FLUOXETINE HCL 20 MG PO CAPS
20.0000 mg | ORAL_CAPSULE | Freq: Every day | ORAL | Status: DC
  Administered 2017-03-04 – 2017-03-19 (×15): 20 mg via ORAL
  Filled 2017-03-04 (×15): qty 1

## 2017-03-04 MED ORDER — ACETAMINOPHEN 650 MG RE SUPP: 650.0000 mg | Freq: Four times a day (QID) | RECTAL | Status: DC | PRN

## 2017-03-04 MED ORDER — LORAZEPAM 1 MG PO TABS
1.0000 mg | ORAL_TABLET | Freq: Three times a day (TID) | ORAL | Status: DC | PRN
  Administered 2017-03-04 – 2017-03-14 (×11): 1 mg via ORAL
  Filled 2017-03-04 (×11): qty 1

## 2017-03-04 MED ORDER — FINASTERIDE 5 MG PO TABS
5.0000 mg | ORAL_TABLET | Freq: Every day | ORAL | Status: DC
  Administered 2017-03-05 – 2017-03-19 (×14): 5 mg via ORAL
  Filled 2017-03-04 (×14): qty 1

## 2017-03-04 MED ORDER — INSULIN ASPART 100 UNIT/ML ~~LOC~~ SOLN
0.0000 [IU] | Freq: Four times a day (QID) | SUBCUTANEOUS | Status: DC
  Administered 2017-03-05: 3 [IU] via SUBCUTANEOUS
  Administered 2017-03-05 – 2017-03-06 (×2): 2 [IU] via SUBCUTANEOUS
  Administered 2017-03-06 (×2): 3 [IU] via SUBCUTANEOUS
  Administered 2017-03-07 – 2017-03-08 (×6): 2 [IU] via SUBCUTANEOUS
  Administered 2017-03-09: 3 [IU] via SUBCUTANEOUS

## 2017-03-04 MED ORDER — ONDANSETRON HCL 4 MG/2ML IJ SOLN: 4.0000 mg | Freq: Four times a day (QID) | INTRAMUSCULAR | Status: DC | PRN

## 2017-03-04 MED ORDER — VANCOMYCIN HCL IN DEXTROSE 1-5 GM/200ML-% IV SOLN
1000.0000 mg | Freq: Once | INTRAVENOUS | Status: AC
  Administered 2017-03-04: 1000 mg via INTRAVENOUS
  Filled 2017-03-04: qty 200

## 2017-03-04 MED ORDER — FENTANYL CITRATE (PF) 100 MCG/2ML IJ SOLN
50.0000 ug | Freq: Once | INTRAMUSCULAR | Status: AC
  Administered 2017-03-04: 50 ug via INTRAVENOUS
  Filled 2017-03-04: qty 2

## 2017-03-04 MED ORDER — SODIUM CHLORIDE 0.9 % IV BOLUS (SEPSIS)
500.0000 mL | Freq: Once | INTRAVENOUS | Status: AC
  Administered 2017-03-04: 500 mL via INTRAVENOUS

## 2017-03-04 MED ORDER — SODIUM CHLORIDE 0.9% FLUSH
3.0000 mL | Freq: Two times a day (BID) | INTRAVENOUS | Status: DC
  Administered 2017-03-04 – 2017-03-08 (×3): 3 mL via INTRAVENOUS

## 2017-03-04 NOTE — H&P (Signed)
History and Physical  [name] UXL:[id number] DOB: [birth year] DOA: 03/04/2017  Referring physician: Tomi Bamberger MD  PCP: [name], MD   Chief Complaint: confusion / hip pain  Historian: ED records, EMS records, pt confused and unable to give much history.   Pt presented from home by EMS.   HPI: [name] is a 84 y.o. male from home where he was found in his bed with a lot of bloody drainage from around his surgical site where he had a left hip surgery done on February 20 by Dr. Lorin Mercy.  Patient is very confused, he just keeps repeating "help me, please help me!"  He is however able to focus and tell [place] his name and birthdate however he did just keeps saying "help me".   He was recently discharged from [facility] on 02/27/17 after having a hip fracture repair.  He was reportedly supposed to discharge to [facility] but patient refused and went home and laid in bed most of time.  He lives alone.  He does have family that checks in on him intermittently.  The patient was found by family to be severely confused and febrile.  He was lying in a pool of blood serous drainage from the left hip wound.  His white blood cell count is elevated to 34.  He had an elevated lactic acid greater than 2.4.  He was also noted to be complaining of hip pain on the left side where he had the repair.  The patient had a CT of the left lower extremity that was describing possible postop hematoma and describes a complex fluid collection along the greater trochanter that is suspicious for infection.  I spoke with his surgeon Dr. Lorin Mercy who recommended that he come to [facility] for orthopedic consult and possible washout procedure.  He has been started on broad-spectrum antibiotics and IV fluids.  Review of Systems: complains of hip pain but unable to obtain due to confusion.  Past Medical History:  Diagnosis Date  . BPH (benign prostatic hyperplasia)   . CAD (coronary artery disease)    Multivessel status post CABG 2011 - LIMA to LAD, SVG to diagonal, SVG to OM, SVG to PDA  . Cellulitis    12/15  . Chronic back pain   . Chronic diastolic CHF (congestive heart failure) ([place])   . CKD (chronic kidney disease), stage III ([place])   . Closed hip fracture ([place]) 02/2017  . COPD (chronic obstructive pulmonary disease) ([place])   . Essential hypertension   . Gastric mass    EGD 9/15  . GERD (gastroesophageal reflux disease)   . History of DVT (deep vein thrombosis)    Postphlebitic syndrome  . History of kidney stones   . HOH (hard of hearing)   . Hx of CABG   . Hyperlipidemia   . Persistent atrial fibrillation ([place])    a. s/p DCCV 03/2016.  [name] Sleep apnea    Stop Bang score of 5  . Type 2 diabetes mellitus ([place])   . UTI (urinary tract infection) 11/2016   Past Surgical History:  Procedure Laterality Date  . AMPUTATION Left 10/07/2016   Procedure: AMPUTATION BELOW KNEE;  Surgeon: [name], MD;  Location: [place];  Service: Orthopedics;  Laterality: Left;  . CARDIOVERSION N/A 03/20/2016   Procedure: CARDIOVERSION;  Surgeon: Arnoldo Lenis, MD;  [facility];  Service: Endoscopy;  Laterality: N/A;  . CATARACT EXTRACTION W/PHACO Left 02/07/2016  Procedure: CATARACT EXTRACTION PHACO AND INTRAOCULAR LENS PLACEMENT (IOC);  Surgeon: [name], MD;  Location: [facility];  Service: Ophthalmology;  Laterality: Left;  CDE:  23.13  . COLONOSCOPY  2004   Dr. Laural Golden: three small polyps at cecum, path unknown, external hemorrhoids  . COLONOSCOPY N/A 08/16/2013   Dr. [name]: incomplete prep. multiple tubular adenomas, multiple biopsies. Needs surveillance in Aug 2016 due to poor prep  . CORONARY ARTERY BYPASS GRAFT     x5  . CORONARY ARTERY BYPASS GRAFT  2011  . CYSTOSCOPY N/A 12/12/2012   Procedure: CYSTOSCOPY FLEXIBLE;  Surgeon: Marissa Nestle, MD;  Location: [facility];  Service: Urology;  Laterality: N/A;  . ESOPHAGEAL DILATION N/A 09/14/2016   Procedure: ESOPHAGEAL DILATION;   Surgeon: Daneil Dolin, MD;  [facility];  Service: Endoscopy;  Laterality: N/A;  . ESOPHAGOGASTRODUODENOSCOPY N/A 08/16/2013   Dr. [name]: normal esophagus s/p Maloney dilation, gastric erosions, submucosal gastric mass vs extrinsic mass  . ESOPHAGOGASTRODUODENOSCOPY (EGD) WITH PROPOFOL N/A 09/14/2016   Procedure: ESOPHAGOGASTRODUODENOSCOPY (EGD) WITH PROPOFOL;  Surgeon: Daneil Dolin, MD;  [facility];  Service: Endoscopy;  Laterality: N/A;  . EUS N/A 09/07/2013   Dr. 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 HIP ARTHROPLASTY Left 02/24/2017   Procedure: ARTHROPLASTY POSTERIOR (HEMIARTHROPLASTY);  Surgeon: [name], MD;  Location: [place];  Service: Orthopedics;  Laterality: Left;  . INCISION AND DRAINAGE ABSCESS N/A 12/20/2013   Procedure: INCISION AND DRAINAGE ABSCESS NECK;  Surgeon: Jamesetta So, MD;  Location[facility];  Service: General;  Laterality: N/A;  . INCISION AND DRAINAGE ABSCESS Left 09/04/2016   Procedure: INCISION AND DRAINAGE ABSCESS;  Surgeon: Aviva Signs, MD;  Location[facility];  Service: General;  Laterality: Left;  [name] MALONEY DILATION N/A 08/16/2013   Procedure: Venia Minks DILATION;  Surgeon: Daneil Dolin, MD;  [facility];  Service: Endoscopy;  Laterality: N/A;   Social History:  reports that he quit smoking about 61 years ago. He has a 11.00 pack-year smoking history. he has never used smokeless tobacco. He reports that he does not drink alcohol or use drugs.  No Known Allergies  Family History  Problem Relation Age of Onset  . Colon cancer Son 9       deceased    Prior to Admission medications   Medication Sig Start Date End Date Taking? Authorizing Provider  amLODipine (NORVASC) 10 MG tablet Take 1 tablet (10 mg total) by mouth daily. 09/27/16   Dondiego, Delfino Lovett, MD  apixaban (ELIQUIS) 5 MG TABS tablet Take 5 mg by mouth 2 (two) times daily.    [provider]  bisacodyl (DULCOLAX) 10 MG  suppository Place 1 suppository (10 mg total) rectally daily as needed for moderate constipation. 02/27/17   Hongalgi, Lenis Dickinson, MD  cholecalciferol (VITAMIN D) 400 units TABS tablet Take 400 Units by mouth daily.    [provider]  cloNIDine (CATAPRES) 0.1 MG tablet Take 0.1 mg by mouth 2 (two) times daily.    [provider]  diphenhydrAMINE (SOMINEX) 25 MG tablet Take 25 mg by mouth at bedtime as needed for sleep.    [provider]  docusate sodium (COLACE) 100 MG capsule Take 1 capsule (100 mg total) by mouth 2 (two) times daily. 02/27/17   Hongalgi, Lenis Dickinson, MD  feeding supplement, GLUCERNA SHAKE, (GLUCERNA SHAKE) LIQD Take 237 mLs by mouth 3 (three) times daily between meals. 02/27/17   Hongalgi,  Lenis Dickinson, MD  finasteride (PROSCAR) 5 MG tablet Take 5 mg daily by mouth.    [provider]  FLUoxetine (PROZAC) 10 MG tablet Take 10 mg by mouth daily.    [provider]  furosemide (LASIX) 40 MG tablet Take 1 tablet (40 mg total) daily as needed by mouth (weight gain of 3 or more pounds in 24-48 hour period). 11/19/16   Mariel Aloe, MD  HYDROcodone-acetaminophen (NORCO) 7.5-325 MG tablet Take 1 tablet by mouth every 6 (six) hours as needed for moderate pain ((score 4 to 6)). 02/26/17   Lanae Crumbly, PA-C  insulin aspart (NOVOLOG) 100 UNIT/ML injection Inject 0-9 Units into the skin 3 (three) times daily with meals. CBG < 70: implement hypoglycemia protocol CBG 70 - 120: 0 units CBG 121 - 150: 1 unit CBG 151 - 200: 2 units CBG 201 - 250: 3 units CBG 251 - 300: 5 units CBG 301 - 350: 7 units CBG 351 - 400: 9 units CBG > 400: call MD. 02/27/17   Modena Jansky, MD  insulin glargine (LANTUS) 100 UNIT/ML injection Inject 0.15 mLs (15 Units total) at bedtime into the skin. 11/19/16   Mariel Aloe, MD  ipratropium-albuterol (DUONEB) 0.5-2.5 (3) MG/3ML SOLN Take 3 mLs by nebulization every 6 (six) hours as needed.    [provider]    LORazepam (ATIVAN) 0.5 MG tablet Take 1 tablet (0.5 mg total) by mouth 3 (three) times daily as needed for anxiety. 02/27/17   Hongalgi, Lenis Dickinson, MD  metoprolol succinate (TOPROL-XL) 25 MG 24 hr tablet Take 1 tablet (25 mg total) by mouth daily. Take with or immediately following a meal. 02/27/17   Hongalgi, Lenis Dickinson, MD  nitroGLYCERIN (NITROSTAT) 0.4 MG SL tablet Place 1 tablet (0.4 mg total) under the tongue every 5 (five) minutes as needed for chest pain. 12/22/13   [name], MD  pantoprazole (PROTONIX) 40 MG tablet Take 40 mg by mouth daily.    [provider]  polyethylene glycol (MIRALAX / GLYCOLAX) packet Take 17 g daily by mouth. 11/20/16   Mariel Aloe, MD  potassium chloride SA (K-DUR,KLOR-CON) 20 MEQ tablet Take 1 tablet (20 mEq total) by mouth daily as needed (with each dose of lasix (furosemide)). Take when Lasix is needed. 02/27/17   Hongalgi, Lenis Dickinson, MD  senna-docusate (SENOKOT-S) 8.6-50 MG tablet Take 1 tablet by mouth at bedtime as needed for mild constipation. 02/27/17   Hongalgi, Lenis Dickinson, MD  tamsulosin (FLOMAX) 0.4 MG CAPS capsule Take 0.4 mg by mouth daily.    [provider]   Physical Exam: Vitals:   03/04/17 0730 03/04/17 0800 03/04/17 0830 03/04/17 0900  BP: (!) 141/66 129/63 137/60 (!) 146/74  Pulse:  98 96 (!) 108  Resp:      Temp:      TempSrc:      SpO2:  91% (!) 87% 93%     General exam: elderly male, confused but cooperative, complaining of hip pain, asking for water, lying comfortably supine on the gurney in no obvious distress.  Head, eyes and ENT: Nontraumatic and normocephalic. Pupils equally reacting to light and accommodation. Oral mucosa dry.  Neck: Supple. No JVD, carotid bruit or thyromegaly.  Lymphatics: No lymphadenopathy.  Respiratory system: Clear to auscultation. No increased work of breathing.  Cardiovascular system: S1 and S2 heard, irregular. No JVD.  Gastrointestinal system: Abdomen is nondistended, soft and  nontender. Normal bowel sounds heard. No organomegaly or masses appreciated.  Central nervous system: Alert and confused. No focal neurological deficits.  Extremities: left AKA. Serous bloody drainage from left hip wound.  Peripheral pulses symmetrically felt.         Skin: No rashes.   Musculoskeletal system: Negative exam.  Psychiatry: Pleasant and cooperative.  Labs on Admission:  Basic Metabolic Panel: Recent Labs  Lab 02/26/17 0717 02/27/17 0541 03/04/17 0602  NA 133* 133* 131*  K 3.6 3.7 4.0  CL 99* 98* 94*  CO2 22 23 22   GLUCOSE 205* 191* 212*  BUN 30* 27* 33*  CREATININE 1.45* 1.25* 1.41*  CALCIUM 10.0 9.8 10.7*   Liver Function Tests: Recent Labs  Lab 03/04/17 0602  AST 20  ALT 14*  ALKPHOS 155*  BILITOT 1.7*  PROT 6.6  ALBUMIN 2.6*   No results for input(s): LIPASE, AMYLASE in the last 168 hours. No results for input(s): AMMONIA in the last 168 hours. CBC: Recent Labs  Lab 02/26/17 0717 02/27/17 0541 03/04/17 0602  WBC 12.9* 21.6* 34.0*  NEUTROABS  --   --  32.0*  HGB 10.1* 9.4* 9.5*  HCT 30.9* 29.6* 30.8*  MCV 81.3 80.4 81.9  PLT 222 223 407*   Cardiac Enzymes: No results for input(s): CKTOTAL, CKMB, CKMBINDEX, TROPONINI in the last 168 hours.  BNP (last 3 results) No results for input(s): PROBNP in the last 8760 hours. CBG: Recent Labs  Lab 02/26/17 1221 02/26/17 1711 02/26/17 2223 02/27/17 0732 02/27/17 1141  GLUCAP 258* 231* 221* 191* 186*    Radiological Exams on Admission: Dg Chest 1 View  Result Date: 03/04/2017 CLINICAL DATA:  84 year old male with fever. EXAM: CHEST 1 VIEW COMPARISON:  Chest radiograph dated 02/22/2017 FINDINGS: There is mild cardiomegaly. Median sternotomy wires and CABG vascular clips. No focal consolidation, pleural effusion, or pneumothorax. No acute osseous pathology. IMPRESSION: No acute cardiopulmonary process. Cardiomegaly. Electronically Signed   By: Anner Crete M.D.   On: 03/04/2017 06:38    Ct Hip Left W Contrast  Result Date: 03/04/2017 CLINICAL DATA:  Pt arrived from home via [facility]. Pt was found in bed this morning with serosanguinous drainage around him draining from recent surgery on his left hip. EXAM: CT OF THE LOWER LEFT EXTREMITY WITH CONTRAST TECHNIQUE: Multidetector CT imaging of the lower left extremity was performed according to the standard protocol following intravenous contrast administration. COMPARISON:  None. CONTRAST:  180mL ISOVUE-300 IOPAMIDOL (ISOVUE-300) INJECTION 61% FINDINGS: Bones/Joint/Cartilage Interval total left hip arthroplasty. Alignment is anatomic. No hardware failure or complication. Beam hardening artifact resulting from the orthopedic hardware partially obscures adjacent soft tissue and osseous structures. No fracture or dislocation. Normal alignment. No joint effusion. Ligaments Ligaments are suboptimally evaluated by CT. Muscles and Tendons Ill-defined area complex fluid along the greater trochanter with multiple locules of air within the fluid which may reflect a postoperative hematoma, but a secondarily infected collection cannot be completely excluded. Small volume of air within left gluteal musculature and along the superficial aspect of the vastus lateralis which may be postsurgical, but infection cannot be excluded given surgery was performed 8 days ago. Soft tissue Severe soft tissue edema in the subcutaneous fat with skin thickening overlying the left hip which may reflect postsurgical edema versus cellulitis. No soft tissue mass. Fat containing left inguinal hernia. IMPRESSION: 1. Ill-defined area complex fluid along the greater trochanter with multiple locules of air within the fluid which may reflect a postoperative hematoma, but a secondarily infected collection cannot be completely excluded. 2. Small volume of air within left  gluteal musculature and along the superficial aspect of the vastus lateralis which may be postsurgical, but infection  cannot be excluded given surgery was performed 8 days ago. 3. Interval total left hip arthroplasty. Alignment is anatomic. No hardware failure or complication. Beam hardening artifact resulting from the orthopedic hardware partially obscures adjacent soft tissue and osseous structures. Electronically Signed   By: Kathreen Devoid   On: 03/04/2017 08:06   Assessment/Plan Principal Problem:   Wound infection after surgery Active Problems:   Leukocytosis   Chronic diastolic CHF (congestive heart failure) (HCC)   Acute renal failure superimposed on stage 3 chronic kidney disease (HCC)   Atrial fibrillation with normal ventricular rate (HCC)   CAD (coronary artery disease)   Anemia in chronic kidney disease (CKD)   Sepsis ([place])   Acute delirium  1. Sepsis secondary to wound infection - admit to tele, start broad spectrum antibiotics and IV fluids.  Follow and trend lactate.  2. Wound infection left hip - I spoke with Dr. Lorin Mercy, will obtain wound culture from fluid drainage and send to lab. Starting broad spectrum IV antibiotics and supportive care.  Transfer to [facility] and Dr. Lorin Mercy will consult.  He likely will need to go to OR for washout procedure.  The patient is so confused now, It is unknown when he took his last dose of eliquis and will defer to ortho opinion about when it is safe for surgery.  I am holding the eliquis for now.  NPO pending orthopedics evaluation.  3. CKD stage 3 - stable, following, continue IVFs. Follow.  4. Anemia in CKD stage 3 - Hemoglobin stable from recent discharge.  5. Acute delirium - Pt does have underlying dementia.  Acute exacerbation secondary to infection, sepsis, follow closely.  Sitter if needed.  6. CAD - stable. No chest pain symptoms.  Check EKG.  7. Leukocytosis - marked rise in WBC, treating with antibiotics and supportive care, follow and trend.  8. Atrial Fibrillation - rate currently controlled, continue metoprolol, holding Eliquis for now given  possibility that he has a postop hematoma and likely will need to go to OR soon.   9. Chronic diastolic heart failure - stable, compensated. 10. COPD - stable. 11. Type 2 Diabetes - sliding scale coverage ordered, reduce lantus to 10 units and adjust as needed.  DVT Prophylaxis: Had been on eliquis (being held now), SCD Code Status: Partial  Family Communication:   Disposition Plan: SNF   Time spent: 65 minutes  Irwin Brakeman, MD [facility] Pager [phone]  If 7PM-7AM, please contact night-coverage [email or web address] Password [facility] 03/04/2017, 9:09 AM

## 2017-03-04 NOTE — ED Provider Notes (Addendum)
[facility] EMERGENCY DEPARTMENT Provider Note   CSN: [id number] Arrival date & time: 03/04/17  7510   Time seen 05:35 AM   History   Chief Complaint Chief Complaint  Patient presents with  . Hip Pain   5 caveat for confusion  HPI [name] is a 84 y.o. male.  HPI patient presents via EMS from home where he was found in his bed with a lot of bloody drainage from around his surgical site where he had a left hip surgery done on February 20 by Dr. Lorin Mercy.  Patient is very confused, he just keeps repeating "help me, please help me!"  He is however able to focus and tell [place] his name and birthdate however he did just keeps saying "help me".  PCP [name], MD   Past Medical History:  Diagnosis Date  . BPH (benign prostatic hyperplasia)   . CAD (coronary artery disease)    Multivessel status post CABG 2011 - LIMA to LAD, SVG to diagonal, SVG to OM, SVG to PDA  . Cellulitis    12/15  . Chronic back pain   . Chronic diastolic CHF (congestive heart failure) ([place])   . CKD (chronic kidney disease), stage III ([place])   . Closed hip fracture ([place]) 02/2017  . COPD (chronic obstructive pulmonary disease) ([place])   . Essential hypertension   . Gastric mass    EGD 9/15  . GERD (gastroesophageal reflux disease)   . History of DVT (deep vein thrombosis)    Postphlebitic syndrome  . History of kidney stones   . HOH (hard of hearing)   . Hx of CABG   . Hyperlipidemia   . Persistent atrial fibrillation ([place])    a. s/p DCCV 03/2016.  [name] Sleep apnea    Stop Bang score of 5  . Type 2 diabetes mellitus ([place])   . UTI (urinary tract infection) 11/2016    Patient Active Problem List   Diagnosis Date Noted  . Closed left hip fracture ([place]) 02/22/2017  . GERD (gastroesophageal reflux disease) 02/22/2017  . CKD (chronic kidney disease), stage III ([place]) 02/22/2017  . Hyperglycemia 02/22/2017  . Anemia in chronic kidney disease (CKD) 02/22/2017  . Hypercalcemia 02/22/2017  .  Moderate dehydration 02/22/2017  . Fall as cause of accidental injury at home as place of occurrence 02/22/2017  . S/P BKA (below knee amputation) unilateral, left ([place]) 02/22/2017  . Bilateral hip pain 02/22/2017  . Pyuria 02/22/2017  . Hyperbilirubinemia 02/22/2017  . UTI (urinary tract infection) 11/13/2016  . Cellulitis and abscess of foot   . COPD (chronic obstructive pulmonary disease) ([place]) 09/09/2016  . CAD (coronary artery disease) 09/09/2016  . Hyperlipidemia 09/09/2016  . Type 2 diabetes mellitus ([place]) 07/24/2016  . Pressure injury of skin 06/11/2016  . Atrial fibrillation with normal ventricular rate ([place]) 06/10/2016  . Esophageal stricture 06/09/2016  . CAD in native artery 06/08/2016  . Acute renal failure superimposed on stage 3 chronic kidney disease ([place]) 06/08/2016  . Chronic diastolic CHF (congestive heart failure) ([place]) 03/19/2016  . Abnormal weight loss   . Thrush, oral 12/18/2013  . Chest pain at rest 12/18/2013  . Leukocytosis 12/18/2013  . Diabetes ([place]) 12/18/2013  . Chronic back pain 12/18/2013  . Dysphagia 12/18/2013  . Odynophagia 12/18/2013  . Gastric mass 09/07/2013  . Personal history of colonic polyps 08/05/2013  . Nausea and vomiting 08/01/2013  . UTI (lower urinary tract infection) 12/09/2012  . Lower extremity edema 06/02/2012  . Essential hypertension  06/02/2012  . History of DVT (deep vein thrombosis) 06/02/2012  . Obesity (BMI 30-39.9): BMI 31.3 06/02/2012    Past Surgical History:  Procedure Laterality Date  . AMPUTATION Left 10/07/2016   Procedure: AMPUTATION BELOW KNEE;  Surgeon: [name], MD;  Location: [facility];  Service: Orthopedics;  Laterality: Left;  . CARDIOVERSION N/A 03/20/2016   Procedure: CARDIOVERSION;  Surgeon: Arnoldo Lenis, MD;  [facility];  Service: Endoscopy;  Laterality: N/A;  . CATARACT EXTRACTION W/PHACO Left 02/07/2016   Procedure: CATARACT EXTRACTION PHACO AND INTRAOCULAR LENS PLACEMENT (IOC);   Surgeon: [name], MD;  Location: [facility];  Service: Ophthalmology;  Laterality: Left;  CDE:  23.13  . COLONOSCOPY  2004   Dr. Laural Golden: three small polyps at cecum, path unknown, external hemorrhoids  . COLONOSCOPY N/A 08/16/2013   Dr. [name]: incomplete prep. multiple tubular adenomas, multiple biopsies. Needs surveillance in Aug 2016 due to poor prep  . CORONARY ARTERY BYPASS GRAFT     x5  . CORONARY ARTERY BYPASS GRAFT  2011  . CYSTOSCOPY N/A 12/12/2012   Procedure: CYSTOSCOPY FLEXIBLE;  Surgeon: Marissa Nestle, MD;  Location: [facility];  Service: Urology;  Laterality: N/A;  . ESOPHAGEAL DILATION N/A 09/14/2016   Procedure: ESOPHAGEAL DILATION;  Surgeon: Daneil Dolin, MD;  [facility];  Service: Endoscopy;  Laterality: N/A;  . ESOPHAGOGASTRODUODENOSCOPY N/A 08/16/2013   Dr. [name]: normal esophagus s/p Maloney dilation, gastric erosions, submucosal gastric mass vs extrinsic mass  . ESOPHAGOGASTRODUODENOSCOPY (EGD) WITH PROPOFOL N/A 09/14/2016   Procedure: ESOPHAGOGASTRODUODENOSCOPY (EGD) WITH PROPOFOL;  Surgeon: Daneil Dolin, MD;  [facility];  Service: Endoscopy;  Laterality: N/A;  . EUS N/A 09/07/2013   Dr. 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 HIP ARTHROPLASTY Left 02/24/2017   Procedure: ARTHROPLASTY POSTERIOR (HEMIARTHROPLASTY);  Surgeon: [name], MD;  Location: [facility];  Service: Orthopedics;  Laterality: Left;  . INCISION AND DRAINAGE ABSCESS N/A 12/20/2013   Procedure: INCISION AND DRAINAGE ABSCESS NECK;  Surgeon: Jamesetta So, MD;  Location: [facility];  Service: General;  Laterality: N/A;  . INCISION AND DRAINAGE ABSCESS Left 09/04/2016   Procedure: INCISION AND DRAINAGE ABSCESS;  Surgeon: Aviva Signs, MD;  Location: [facility];  Service: General;  Laterality: Left;  [name] MALONEY DILATION N/A 08/16/2013   Procedure: Venia Minks DILATION;  Surgeon: Daneil Dolin, MD;  [facility];  Service: Endoscopy;  Laterality: N/A;        Home Medications    Prior to Admission medications   Medication Sig Start Date End Date Taking? Authorizing Provider  amLODipine (NORVASC) 10 MG tablet Take 1 tablet (10 mg total) by mouth daily. 09/27/16   Dondiego, Delfino Lovett, MD  apixaban (ELIQUIS) 5 MG TABS tablet Take 5 mg by mouth 2 (two) times daily.    [provider]  bisacodyl (DULCOLAX) 10 MG suppository Place 1 suppository (10 mg total) rectally daily as needed for moderate constipation. 02/27/17   Hongalgi, Lenis Dickinson, MD  cholecalciferol (VITAMIN D) 400 units TABS tablet Take 400 Units by mouth daily.    [provider]  cloNIDine (CATAPRES) 0.1 MG tablet Take 0.1 mg by mouth 2 (two) times daily.    [provider]  diphenhydrAMINE (SOMINEX) 25 MG tablet Take 25 mg by mouth at bedtime as needed for sleep.    [provider]  docusate sodium (COLACE) 100 MG capsule Take 1 capsule (100 mg total) by mouth  2 (two) times daily. 02/27/17   Hongalgi, Lenis Dickinson, MD  feeding supplement, GLUCERNA SHAKE, (GLUCERNA SHAKE) LIQD Take 237 mLs by mouth 3 (three) times daily between meals. 02/27/17   Hongalgi, Lenis Dickinson, MD  finasteride (PROSCAR) 5 MG tablet Take 5 mg daily by mouth.    [provider]  FLUoxetine (PROZAC) 10 MG tablet Take 10 mg by mouth daily.    [provider]  furosemide (LASIX) 40 MG tablet Take 1 tablet (40 mg total) daily as needed by mouth (weight gain of 3 or more pounds in 24-48 hour period). 11/19/16   Mariel Aloe, MD  HYDROcodone-acetaminophen (NORCO) 7.5-325 MG tablet Take 1 tablet by mouth every 6 (six) hours as needed for moderate pain ((score 4 to 6)). 02/26/17   Lanae Crumbly, PA-C  insulin aspart (NOVOLOG) 100 UNIT/ML injection Inject 0-9 Units into the skin 3 (three) times daily with meals. CBG < 70: implement hypoglycemia protocol CBG 70 - 120: 0 units CBG 121 - 150: 1 unit CBG 151 - 200: 2 units CBG 201 - 250: 3 units CBG 251 - 300: 5 units CBG 301 -  350: 7 units CBG 351 - 400: 9 units CBG > 400: call MD. 02/27/17   Modena Jansky, MD  insulin glargine (LANTUS) 100 UNIT/ML injection Inject 0.15 mLs (15 Units total) at bedtime into the skin. 11/19/16   Mariel Aloe, MD  ipratropium-albuterol (DUONEB) 0.5-2.5 (3) MG/3ML SOLN Take 3 mLs by nebulization every 6 (six) hours as needed.    [provider]  LORazepam (ATIVAN) 0.5 MG tablet Take 1 tablet (0.5 mg total) by mouth 3 (three) times daily as needed for anxiety. 02/27/17   Hongalgi, Lenis Dickinson, MD  metoprolol succinate (TOPROL-XL) 25 MG 24 hr tablet Take 1 tablet (25 mg total) by mouth daily. Take with or immediately following a meal. 02/27/17   Hongalgi, Lenis Dickinson, MD  nitroGLYCERIN (NITROSTAT) 0.4 MG SL tablet Place 1 tablet (0.4 mg total) under the tongue every 5 (five) minutes as needed for chest pain. 12/22/13   [name], MD  pantoprazole (PROTONIX) 40 MG tablet Take 40 mg by mouth daily.    [provider]  polyethylene glycol (MIRALAX / GLYCOLAX) packet Take 17 g daily by mouth. 11/20/16   Mariel Aloe, MD  potassium chloride SA (K-DUR,KLOR-CON) 20 MEQ tablet Take 1 tablet (20 mEq total) by mouth daily as needed (with each dose of lasix (furosemide)). Take when Lasix is needed. 02/27/17   Hongalgi, Lenis Dickinson, MD  senna-docusate (SENOKOT-S) 8.6-50 MG tablet Take 1 tablet by mouth at bedtime as needed for mild constipation. 02/27/17   Hongalgi, Lenis Dickinson, MD  tamsulosin (FLOMAX) 0.4 MG CAPS capsule Take 0.4 mg by mouth daily.    [provider]    Family History Family History  Problem Relation Age of Onset  . Colon cancer Son 46       deceased    Social History Social History   Tobacco Use  . Smoking status: Former Smoker    Packs/day: 1.00    Years: 11.00    Pack years: 11.00    Last attempt to quit: 01/05/1956    Years since quitting: 61.2  . Smokeless tobacco: Never Used  Substance Use Topics  . Alcohol use: No  . Drug use: No  lives at  home   Allergies   Patient has no known allergies.   Review of Systems Review of Systems  Unable to perform  ROS: Other     Physical Exam Updated Vital Signs BP (!) 153/96   Pulse (!) 108   Temp 98.2 F (36.8 C) (Axillary)   Resp 20   SpO2 99%   Vital signs normal except tachypnea but patient is agitated, tachycardia   Physical Exam  Constitutional: He appears well-developed and well-nourished. He appears distressed.  HENT:  Head: Normocephalic and atraumatic.  Right Ear: External ear normal.  Left Ear: External ear normal.  Nose: Nose normal.  Eyes: Conjunctivae and EOM are normal. Pupils are equal, round, and reactive to light.  Neck: Neck supple.  Cardiovascular: Tachycardia present.  Pulmonary/Chest: Effort normal and breath sounds normal. No respiratory distress.  Musculoskeletal: He exhibits edema and tenderness.  Patient has some diffuse swelling along the lateral aspect of his left hip, with subcutaneous induration palpated, there is a surgical incision that is linear and along the long axis of his leg with staples still in place.  The gauze dressing over it is soaked with bloody drainage.  There is no active bleeding however when I look at the wound.  There is a foul smell to the drainage.  Patient has a BKA on the left  Neurological: He is alert. A cranial nerve deficit is present.  Skin: Skin is warm and dry. No erythema.  Psychiatric: His mood appears anxious. His speech is rapid and/or pressured. He is agitated.  Nursing note and vitals reviewed.      ED Treatments / Results  Labs (all labs ordered are listed, but only abnormal results are displayed) Results for orders placed or performed during the hospital encounter of 03/04/17  Culture, blood (routine x 2)  Result Value Ref Range   Specimen Description BLOOD RIGHT ARM    Special Requests      BOTTLES DRAWN AEROBIC AND ANAEROBIC Blood Culture adequate volume   Culture      NO GROWTH <12  HOURS Performed at [facility], [address] [place], [place] [address]    Report Status PENDING   Culture, blood (routine x 2)  Result Value Ref Range   Specimen Description BLOOD RIGHT FOREARM DRAWN BY RN    Special Requests      BOTTLES DRAWN AEROBIC AND ANAEROBIC Blood Culture adequate volume   Culture      NO GROWTH <12 HOURS Performed at [facility], [address] [place], [place] [address]    Report Status PENDING   Comprehensive metabolic panel  Result Value Ref Range   Sodium 131 (L) 135 - 145 mmol/L   Potassium 4.0 3.5 - 5.1 mmol/L   Chloride 94 (L) 101 - 111 mmol/L   CO2 22 22 - 32 mmol/L   Glucose, Bld 212 (H) 65 - 99 mg/dL   BUN 33 (H) 6 - 20 mg/dL   Creatinine, Ser 1.41 (H) 0.61 - 1.24 mg/dL   Calcium 10.7 (H) 8.9 - 10.3 mg/dL   Total Protein 6.6 6.5 - 8.1 g/dL   Albumin 2.6 (L) 3.5 - 5.0 g/dL   AST 20 15 - 41 U/L   ALT 14 (L) 17 - 63 U/L   Alkaline Phosphatase 155 (H) 38 - 126 U/L   Total Bilirubin 1.7 (H) 0.3 - 1.2 mg/dL   GFR calc non Af Amer 44 (L) >60 mL/min   GFR calc Af Amer 52 (L) >60 mL/min   Anion gap 15 5 - 15  CBC with Differential  Result Value Ref Range   WBC 34.0 (H) 4.0 - 10.5  K/uL   RBC 3.76 (L) 4.22 - 5.81 MIL/uL   Hemoglobin 9.5 (L) 13.0 - 17.0 g/dL   HCT 30.8 (L) 39.0 - 52.0 %   MCV 81.9 78.0 - 100.0 fL   MCH 25.3 (L) 26.0 - 34.0 pg   MCHC 30.8 30.0 - 36.0 g/dL   RDW 16.9 (H) 11.5 - 15.5 %   Platelets 407 (H) 150 - 400 K/uL   Neutrophils Relative % 94 %   Lymphocytes Relative 1 %   Monocytes Relative 5 %   Eosinophils Relative 0 %   Basophils Relative 0 %   Neutro Abs 32.0 (H) 1.7 - 7.7 K/uL   Lymphs Abs 0.3 (L) 0.7 - 4.0 K/uL   Monocytes Absolute 1.7 (H) 0.1 - 1.0 K/uL   Eosinophils Absolute 0.0 0.0 - 0.7 K/uL   Basophils Absolute 0.0 0.0 - 0.1 K/uL   WBC Morphology INCREASED BANDS (>20% BANDS)   Urinalysis, Routine w reflex microscopic  Result Value Ref Range   Color, Urine YELLOW YELLOW   APPearance HAZY (A) CLEAR    Specific Gravity, Urine 1.012 1.005 - 1.030   pH 5.0 5.0 - 8.0   Glucose, UA 50 (A) NEGATIVE mg/dL   Hgb urine dipstick SMALL (A) NEGATIVE   Bilirubin Urine NEGATIVE NEGATIVE   Ketones, ur 5 (A) NEGATIVE mg/dL   Protein, ur 100 (A) NEGATIVE mg/dL   Nitrite NEGATIVE NEGATIVE   Leukocytes, UA NEGATIVE NEGATIVE   RBC / HPF 0-5 0 - 5 RBC/hpf   WBC, UA 0-5 0 - 5 WBC/hpf   Bacteria, UA NONE SEEN NONE SEEN   Squamous Epithelial / LPF 0-5 (A) NONE SEEN   Mucus PRESENT    Uric Acid Crys, UA PRESENT   I-Stat CG4 Lactic Acid, ED  (not at  [facility])  Result Value Ref Range   Lactic Acid, Venous 2.43 (HH) 0.5 - 1.9 mmol/L   Comment NOTIFIED PHYSICIAN    Laboratory interpretation all normal except mildly elevated lactic acid, stable anemia, stable renal insufficiency, hyperglycemia, malnutrition, leukocytosis with left shift    EKG  EKG Interpretation None       Radiology Dg Chest 1 View  Result Date: 03/04/2017 CLINICAL DATA:  84 year old male with fever. EXAM: CHEST 1 VIEW COMPARISON:  Chest radiograph dated 02/22/2017 FINDINGS: There is mild cardiomegaly. Median sternotomy wires and CABG vascular clips. No focal consolidation, pleural effusion, or pneumothorax. No acute osseous pathology. IMPRESSION: No acute cardiopulmonary process. Cardiomegaly. Electronically Signed   By: Anner Crete M.D.   On: 03/04/2017 06:38   Ct Hip Left W Contrast  Result Date: 03/04/2017 CLINICAL DATA:  Pt arrived from home via [facility]. Pt was found in bed this morning with serosanguinous drainage around him draining from recent surgery on his left hip. EXAM: CT OF THE LOWER LEFT EXTREMITY WITH CONTRAST TECHNIQUE: Multidetector CT imaging of the lower left extremity was performed according to the standard protocol following intravenous contrast administration. COMPARISON:  None. CONTRAST:  162mL ISOVUE-300 IOPAMIDOL (ISOVUE-300) INJECTION 61% FINDINGS: Bones/Joint/Cartilage Interval total left hip arthroplasty.  Alignment is anatomic. No hardware failure or complication. Beam hardening artifact resulting from the orthopedic hardware partially obscures adjacent soft tissue and osseous structures. No fracture or dislocation. Normal alignment. No joint effusion. Ligaments Ligaments are suboptimally evaluated by CT. Muscles and Tendons Ill-defined area complex fluid along the greater trochanter with multiple locules of air within the fluid which may reflect a postoperative hematoma, but a secondarily infected collection cannot be completely excluded. Small volume  of air within left gluteal musculature and along the superficial aspect of the vastus lateralis which may be postsurgical, but infection cannot be excluded given surgery was performed 8 days ago. Soft tissue Severe soft tissue edema in the subcutaneous fat with skin thickening overlying the left hip which may reflect postsurgical edema versus cellulitis. No soft tissue mass. Fat containing left inguinal hernia. IMPRESSION: 1. Ill-defined area complex fluid along the greater trochanter with multiple locules of air within the fluid which may reflect a postoperative hematoma, but a secondarily infected collection cannot be completely excluded. 2. Small volume of air within left gluteal musculature and along the superficial aspect of the vastus lateralis which may be postsurgical, but infection cannot be excluded given surgery was performed 8 days ago. 3. Interval total left hip arthroplasty. Alignment is anatomic. No hardware failure or complication. Beam hardening artifact resulting from the orthopedic hardware partially obscures adjacent soft tissue and osseous structures. Electronically Signed   By: Kathreen Devoid   On: 03/04/2017 08:06    Procedures .Critical Care Performed by: Rolland Porter, MD Authorized by: Rolland Porter, MD   Critical care provider statement:    Critical care time (minutes):  31   Critical care was necessary to treat or prevent imminent or  life-threatening deterioration of the following conditions:  Dehydration and circulatory failure   Critical care was time spent personally by me on the following activities:  Discussions with consultants, examination of patient, obtaining history from patient or surrogate, ordering and review of radiographic studies, ordering and review of laboratory studies, pulse oximetry, re-evaluation of patient's condition and review of old charts   (including critical care time)  Medications Ordered in ED Medications  vancomycin (VANCOCIN) IVPB 1000 mg/200 mL premix (1,000 mg Intravenous New Bag/Given 03/04/17 0741)  iopamidol (ISOVUE-300) 61 % injection 100 mL (100 mLs Intravenous Contrast Given 03/04/17 0700)  piperacillin-tazobactam (ZOSYN) IVPB 3.375 g (0 g Intravenous Stopped 03/04/17 0746)  sodium chloride 0.9 % bolus 1,000 mL (1,000 mLs Intravenous New Bag/Given 03/04/17 0719)  sodium chloride 0.9 % bolus 500 mL (500 mLs Intravenous New Bag/Given 03/04/17 0720)  acetaminophen (TYLENOL) tablet 650 mg (650 mg Oral Given 03/04/17 0740)  fentaNYL (SUBLIMAZE) injection 50 mcg (50 mcg Intravenous Given 03/04/17 0754)     Initial Impression / Assessment and Plan / ED Course  I have reviewed the triage vital signs and the nursing notes.  Pertinent labs & imaging results that were available during my care of the patient were reviewed by me and considered in my medical decision making (see chart for details).     When I review patient's recent hospitalization it is documented in several places that he was confused.  I do not see a diagnosis of dementia but he acts like a dementia patient.  Rectal temp was ordered, his axillary temp was normal.  He did have a fever, sepsis protocol was started without calling code sepsis.  However since his blood pressure stable he was only given one half of the recommended bolus of 30 cc/kg.  Nurses report his urine was concentrated but looked clear.  His initial chest x-ray  did not show infiltrate.  The source of his fever appears to be his surgical wound.  He was started on cellulitis antibiotics.  I have looked at patient's CT scan, there is some fluid collection deep in the tissue.  However I am still waiting for the radiologist to read his scan.  Patient's labs have resulted and he has a  marked leukocytosis with a left shift consistent with an infection.  I am going to talk to the hospitalist about admission.  He may need to be transferred down to [facility] where his orthopedic surgeon can evaluate his wound.  08:11 AM Sima Matas, PA will give information to the hospitalist.   Final Clinical Impressions(s) / ED Diagnoses   Final diagnoses:  Post-operative infection    Plan admission  Rolland Porter, MD, Barbette Or, MD 03/04/17 8003    Rolland Porter, MD 03/11/17 [phone]

## 2017-03-04 NOTE — ED Provider Notes (Signed)
Continued care signed out by Dr Meda Coffee Tomi Bamberger  Vital signs remained steady.  CT scan of the left hip with contrast reveals an ill-defined area complex of fluid along the greater trochanter with multiple locules of air within the fluid area which may reflect a postoperative hematoma but a secondary infection could not be ruled out.  There is a small volume of air within the left gluteal musculoskeletal area along the superficial aspect of the vastus lateralis, again raising question of infection.  The total hip left arthroplasty shows good alignment.  Case discussed with Dr. Wynetta Emery with [facility] hospitalist.  Call placed to Dr. Lorin Mercy to discuss the case.  Dr. Wynetta Emery reach Dr. Lorin Mercy, and the patient will have a hospitalist to hospitalist transfer, Dr. Lorin Mercy will consult on the patient upon arrival at the [place].  Diagnosis: #1 sepsis #2 postoperative cellulitis left hip  Patient will be transported to the [facility] by [employer] [facility].     [name] Kocher, PA-C 03/04/17 7782    Milton Ferguson, MD 03/04/17 [phone]

## 2017-03-04 NOTE — ED Notes (Signed)
Per MD Tiburcio Bash pt is allowed ice chips or sips of water.

## 2017-03-04 NOTE — ED Notes (Signed)
Pt took his last eliquis  On 03/03/17

## 2017-03-04 NOTE — ED Triage Notes (Signed)
Pt arrived from home via [facility]. Pt was found in bed this morning with serosanguinous drainage around him draining from recent surgery on his left hip.

## 2017-03-04 NOTE — ED Notes (Signed)
CRITICAL VALUE ALERT  Critical Value:  I-Stat Lactic Acid  Date & Time Notied:  03/04/17 06:56am  Provider Notified: Dr. Tomi Bamberger  Orders Received/Actions taken: No new orders at this time

## 2017-03-04 NOTE — Progress Notes (Addendum)
Pharmacy Note:  Initial antibiotic(s) regimen of vancomycin and zosyn ordered by EDP to treat cellulitis.  Estimated Creatinine Clearance: 38.4 mL/min (A) (by C-G formula based on SCr of 1.41 mg/dL (H)).   No Known Allergies  Vitals:   03/04/17 0830 03/04/17 0900  BP: 137/60 (!) 146/74  Pulse: 96 (!) 108  Resp:    Temp:    SpO2: (!) 87% 93%    Anti-infectives (From admission, onward)   Start     Dose/Rate Route Frequency Ordered Stop   03/04/17 0645  piperacillin-tazobactam (ZOSYN) IVPB 3.375 g     3.375 g 100 mL/hr over 30 Minutes Intravenous  Once 03/04/17 0643 03/04/17 0746   03/04/17 0645  vancomycin (VANCOCIN) IVPB 1000 mg/200 mL premix     1,000 mg 200 mL/hr over 60 Minutes Intravenous  Once 03/04/17 0643 03/04/17 0859      Plan: Initial dose(s) of Vancomycin 1gm and Zosyn 3.375gm IV X 1 ordered in ED. F/U admission orders for further dosing if therapy continued. Plan is to transfer to [facility], BS Pharm D, BCPS Clinical Pharmacist Pager [phone]  Addendum: Plan : Continue with Zosyn 3.375gm IV q8h EID, over 4 hours  Continue with Vancomycin 750mg  IV q24h, goal trough 10-63mcg/ml  Isac Sarna, BS [place], [place] Clinical Pharmacist Pager [phone]   03/04/2017 9:27 AM

## 2017-03-04 NOTE — ED Notes (Signed)
Per Gerald Stabs, RN report has already been called to the floor.

## 2017-03-04 NOTE — ED Notes (Signed)
RN reviewed Pt's notes and according to a previous RN note on 02/22/17, Pt was Alert and Oriented. Today, Pt presents Disoriented and Not Alert. Pt is only oriented to self. Disoriented to place, time and situation. Attempts to Reorient the Pt have been unsuccessful. Per Pts POA who was briefly in the ED, Pts family cannot handle the Pt and there is a male caregiver who checks on the Pt at his home where he lives alone. Pt keeps repeating "please help me...help me." but is unable to describe what he needs help with or wants when Pt is asked. Pt does verbalize when he needs to urinate and can use a Urinal with assistance. Per POA, Pt uses a wheel chair to ambulate at home.

## 2017-03-04 NOTE — ED Notes (Signed)
Pt did not take his equ

## 2017-03-05 ENCOUNTER — Inpatient Hospital Stay (HOSPITAL_COMMUNITY): Payer: Medicare Other | Admitting: Anesthesiology

## 2017-03-05 ENCOUNTER — Encounter (HOSPITAL_COMMUNITY)
Admission: EM | Disposition: A | Discharge status: Skilled Nursing Facility | Payer: Self-pay | Source: Home / Self Care | Attending: Internal Medicine

## 2017-03-05 ENCOUNTER — Other Ambulatory Visit: Payer: Self-pay

## 2017-03-05 ENCOUNTER — Encounter (HOSPITAL_COMMUNITY): Payer: Self-pay | Admitting: *Deleted

## 2017-03-05 DIAGNOSIS — L97519 Non-pressure chronic ulcer of other part of right foot with unspecified severity: Secondary | ICD-10-CM

## 2017-03-05 DIAGNOSIS — T8140XA Infection following a procedure, unspecified, initial encounter: Secondary | ICD-10-CM

## 2017-03-05 DIAGNOSIS — Z9889 Other specified postprocedural states: Secondary | ICD-10-CM

## 2017-03-05 DIAGNOSIS — R410 Disorientation, unspecified: Secondary | ICD-10-CM

## 2017-03-05 DIAGNOSIS — B9562 Methicillin resistant Staphylococcus aureus infection as the cause of diseases classified elsewhere: Secondary | ICD-10-CM

## 2017-03-05 DIAGNOSIS — I5032 Chronic diastolic (congestive) heart failure: Secondary | ICD-10-CM

## 2017-03-05 DIAGNOSIS — Z87891 Personal history of nicotine dependence: Secondary | ICD-10-CM

## 2017-03-05 DIAGNOSIS — Z96642 Presence of left artificial hip joint: Secondary | ICD-10-CM

## 2017-03-05 DIAGNOSIS — R262 Difficulty in walking, not elsewhere classified: Secondary | ICD-10-CM

## 2017-03-05 DIAGNOSIS — N189 Chronic kidney disease, unspecified: Secondary | ICD-10-CM

## 2017-03-05 DIAGNOSIS — T8149XA Infection following a procedure, other surgical site, initial encounter: Secondary | ICD-10-CM

## 2017-03-05 DIAGNOSIS — I499 Cardiac arrhythmia, unspecified: Secondary | ICD-10-CM

## 2017-03-05 DIAGNOSIS — T8452XA Infection and inflammatory reaction due to internal left hip prosthesis, initial encounter: Principal | ICD-10-CM

## 2017-03-05 DIAGNOSIS — D631 Anemia in chronic kidney disease: Secondary | ICD-10-CM

## 2017-03-05 DIAGNOSIS — Z978 Presence of other specified devices: Secondary | ICD-10-CM

## 2017-03-05 HISTORY — PX: INCISION AND DRAINAGE HIP: SHX1801

## 2017-03-05 LAB — BLOOD CULTURE ID PANEL (REFLEXED)
ACINETOBACTER BAUMANNII: NOT DETECTED
CANDIDA ALBICANS: NOT DETECTED
CANDIDA GLABRATA: NOT DETECTED
Candida krusei: NOT DETECTED
Candida parapsilosis: NOT DETECTED
Candida tropicalis: NOT DETECTED
ENTEROBACTER CLOACAE COMPLEX: NOT DETECTED
ENTEROBACTERIACEAE SPECIES: NOT DETECTED
Enterococcus species: NOT DETECTED
Escherichia coli: NOT DETECTED
HAEMOPHILUS INFLUENZAE: NOT DETECTED
KLEBSIELLA OXYTOCA: NOT DETECTED
Klebsiella pneumoniae: NOT DETECTED
Listeria monocytogenes: NOT DETECTED
METHICILLIN RESISTANCE: DETECTED — AB
Neisseria meningitidis: NOT DETECTED
PSEUDOMONAS AERUGINOSA: NOT DETECTED
Proteus species: NOT DETECTED
STREPTOCOCCUS PNEUMONIAE: NOT DETECTED
STREPTOCOCCUS PYOGENES: NOT DETECTED
STREPTOCOCCUS SPECIES: NOT DETECTED
Serratia marcescens: NOT DETECTED
Staphylococcus aureus (BCID): NOT DETECTED
Staphylococcus species: DETECTED — AB
Streptococcus agalactiae: NOT DETECTED

## 2017-03-05 LAB — GLUCOSE, CAPILLARY
GLUCOSE-CAPILLARY: 164 mg/dL — AB (ref 65–99)
GLUCOSE-CAPILLARY: 197 mg/dL — AB (ref 65–99)
GLUCOSE-CAPILLARY: 191 mg/dL — AB (ref 65–99)
Glucose-Capillary: 223 mg/dL — ABNORMAL HIGH (ref 65–99)
Glucose-Capillary: 208 mg/dL — ABNORMAL HIGH (ref 65–99)
Glucose-Capillary: 206 mg/dL — ABNORMAL HIGH (ref 65–99)

## 2017-03-05 SURGERY — IRRIGATION AND DEBRIDEMENT HIP
Anesthesia: General | Site: Hip | Laterality: Left

## 2017-03-05 MED ORDER — ROCURONIUM BROMIDE 10 MG/ML (PF) SYRINGE
PREFILLED_SYRINGE | INTRAVENOUS | Status: AC
  Filled 2017-03-05: qty 5

## 2017-03-05 MED ORDER — LIDOCAINE 2% (20 MG/ML) 5 ML SYRINGE
INTRAMUSCULAR | Status: AC
  Filled 2017-03-05: qty 5

## 2017-03-05 MED ORDER — ONDANSETRON HCL 4 MG/2ML IJ SOLN
INTRAMUSCULAR | Status: DC | PRN
  Administered 2017-03-05: 4 mg via INTRAVENOUS

## 2017-03-05 MED ORDER — ESMOLOL HCL 100 MG/10ML IV SOLN
INTRAVENOUS | Status: AC
  Filled 2017-03-05: qty 10

## 2017-03-05 MED ORDER — GENTAMICIN SULFATE 40 MG/ML IJ SOLN
INTRAMUSCULAR | Status: DC | PRN
  Administered 2017-03-05: 240 mg

## 2017-03-05 MED ORDER — PHENOL 1.4 % MT LIQD: 1.0000 | OROMUCOSAL | Status: DC | PRN

## 2017-03-05 MED ORDER — ONDANSETRON HCL 4 MG/2ML IJ SOLN
INTRAMUSCULAR | Status: AC
  Filled 2017-03-05: qty 2

## 2017-03-05 MED ORDER — ONDANSETRON HCL 4 MG/2ML IJ SOLN
INTRAMUSCULAR | Status: AC
  Filled 2017-03-05: qty 6

## 2017-03-05 MED ORDER — PHENYLEPHRINE 40 MCG/ML (10ML) SYRINGE FOR IV PUSH (FOR BLOOD PRESSURE SUPPORT)
PREFILLED_SYRINGE | INTRAVENOUS | Status: AC
  Filled 2017-03-05: qty 10

## 2017-03-05 MED ORDER — MENTHOL 3 MG MT LOZG
1.0000 | LOZENGE | OROMUCOSAL | Status: DC | PRN
  Filled 2017-03-05: qty 9

## 2017-03-05 MED ORDER — SUGAMMADEX SODIUM 200 MG/2ML IV SOLN
INTRAVENOUS | Status: DC | PRN
  Administered 2017-03-05: 148 mg via INTRAVENOUS

## 2017-03-05 MED ORDER — ESMOLOL HCL 100 MG/10ML IV SOLN
INTRAVENOUS | Status: DC | PRN
  Administered 2017-03-05: 20 mg via INTRAVENOUS

## 2017-03-05 MED ORDER — VANCOMYCIN HCL IN DEXTROSE 1-5 GM/200ML-% IV SOLN
1000.0000 mg | INTRAVENOUS | Status: DC
  Administered 2017-03-06 – 2017-03-08 (×3): 1000 mg via INTRAVENOUS
  Filled 2017-03-05 (×3): qty 200

## 2017-03-05 MED ORDER — PROPOFOL 10 MG/ML IV BOLUS
INTRAVENOUS | Status: AC
  Filled 2017-03-05: qty 20

## 2017-03-05 MED ORDER — DEXAMETHASONE SODIUM PHOSPHATE 10 MG/ML IJ SOLN
INTRAMUSCULAR | Status: AC
  Filled 2017-03-05: qty 1

## 2017-03-05 MED ORDER — FENTANYL CITRATE (PF) 250 MCG/5ML IJ SOLN
INTRAMUSCULAR | Status: AC
  Filled 2017-03-05: qty 5

## 2017-03-05 MED ORDER — APIXABAN 5 MG PO TABS
5.0000 mg | ORAL_TABLET | Freq: Two times a day (BID) | ORAL | Status: DC
  Administered 2017-03-06 – 2017-03-19 (×27): 5 mg via ORAL
  Filled 2017-03-05 (×27): qty 1

## 2017-03-05 MED ORDER — VANCOMYCIN HCL 1000 MG IV SOLR
INTRAVENOUS | Status: DC | PRN
  Administered 2017-03-05: 1000 mg

## 2017-03-05 MED ORDER — EPHEDRINE 5 MG/ML INJ
INTRAVENOUS | Status: AC
  Filled 2017-03-05: qty 10

## 2017-03-05 MED ORDER — ONDANSETRON HCL 4 MG/2ML IJ SOLN
4.0000 mg | Freq: Four times a day (QID) | INTRAMUSCULAR | Status: DC | PRN
  Administered 2017-03-13: 4 mg via INTRAVENOUS
  Filled 2017-03-05: qty 2

## 2017-03-05 MED ORDER — VANCOMYCIN HCL 1000 MG IV SOLR
INTRAVENOUS | Status: AC
  Filled 2017-03-05: qty 1000

## 2017-03-05 MED ORDER — GENTAMICIN SULFATE 40 MG/ML IJ SOLN
INTRAMUSCULAR | Status: AC
  Filled 2017-03-05: qty 6

## 2017-03-05 MED ORDER — DOCUSATE SODIUM 100 MG PO CAPS
100.0000 mg | ORAL_CAPSULE | Freq: Two times a day (BID) | ORAL | Status: DC
  Administered 2017-03-05 – 2017-03-09 (×10): 100 mg via ORAL
  Filled 2017-03-05 (×10): qty 1

## 2017-03-05 MED ORDER — FENTANYL CITRATE (PF) 100 MCG/2ML IJ SOLN
INTRAMUSCULAR | Status: DC | PRN
  Administered 2017-03-05: 75 ug via INTRAVENOUS
  Administered 2017-03-05 (×2): 25 ug via INTRAVENOUS
  Administered 2017-03-05: 50 ug via INTRAVENOUS
  Administered 2017-03-05: 25 ug via INTRAVENOUS

## 2017-03-05 MED ORDER — PROPOFOL 10 MG/ML IV BOLUS
INTRAVENOUS | Status: DC | PRN
  Administered 2017-03-05: 20 mg via INTRAVENOUS
  Administered 2017-03-05: 130 mg via INTRAVENOUS

## 2017-03-05 MED ORDER — LACTATED RINGERS IV SOLN
INTRAVENOUS | Status: DC | PRN
  Administered 2017-03-05: 11:00:00 via INTRAVENOUS

## 2017-03-05 MED ORDER — FENTANYL CITRATE (PF) 100 MCG/2ML IJ SOLN
25.0000 ug | INTRAMUSCULAR | Status: DC | PRN
  Administered 2017-03-05: 50 ug via INTRAVENOUS

## 2017-03-05 MED ORDER — PHENYLEPHRINE HCL 10 MG/ML IJ SOLN
INTRAMUSCULAR | Status: DC | PRN
  Administered 2017-03-05 (×4): 80 ug via INTRAVENOUS

## 2017-03-05 MED ORDER — ONDANSETRON HCL 4 MG/2ML IJ SOLN: 4.0000 mg | Freq: Once | INTRAMUSCULAR | Status: DC | PRN

## 2017-03-05 MED ORDER — LIDOCAINE HCL (CARDIAC) 20 MG/ML IV SOLN
INTRAVENOUS | Status: DC | PRN
  Administered 2017-03-05: 60 mg via INTRAVENOUS

## 2017-03-05 MED ORDER — SODIUM CHLORIDE 0.9 % IR SOLN
Status: DC | PRN
  Administered 2017-03-05: 3000 mL

## 2017-03-05 MED ORDER — ROCURONIUM BROMIDE 100 MG/10ML IV SOLN
INTRAVENOUS | Status: DC | PRN
  Administered 2017-03-05: 30 mg via INTRAVENOUS

## 2017-03-05 MED ORDER — LACTATED RINGERS IV SOLN
INTRAVENOUS | Status: DC
  Administered 2017-03-05: 10:00:00 via INTRAVENOUS

## 2017-03-05 MED ORDER — ONDANSETRON HCL 4 MG PO TABS
4.0000 mg | ORAL_TABLET | Freq: Four times a day (QID) | ORAL | Status: DC | PRN
  Administered 2017-03-08 – 2017-03-09 (×2): 4 mg via ORAL
  Filled 2017-03-05 (×2): qty 1

## 2017-03-05 MED ORDER — FENTANYL CITRATE (PF) 100 MCG/2ML IJ SOLN
INTRAMUSCULAR | Status: AC
  Filled 2017-03-05: qty 2

## 2017-03-05 MED ORDER — MUPIROCIN 2 % EX OINT: TOPICAL_OINTMENT | Freq: Two times a day (BID) | CUTANEOUS | Status: DC

## 2017-03-05 SURGICAL SUPPLY — 45 items
BOWL SMART MIX CTS (DISPOSABLE) IMPLANT
COVER SURGICAL LIGHT HANDLE (MISCELLANEOUS) ×3 IMPLANT
DRAPE IMP U-DRAPE 54X76 (DRAPES) ×3 IMPLANT
DRAPE ORTHO SPLIT 77X108 STRL (DRAPES) ×4
DRAPE SURG ORHT 6 SPLT 77X108 (DRAPES) ×2 IMPLANT
DRAPE U-SHAPE 47X51 STRL (DRAPES) ×3 IMPLANT
DRSG PAD ABDOMINAL 8X10 ST (GAUZE/BANDAGES/DRESSINGS) ×3 IMPLANT
DURAPREP 26ML APPLICATOR (WOUND CARE) ×3 IMPLANT
ELECT CAUTERY BLADE 6.4 (BLADE) IMPLANT
ELECT REM PT RETURN 9FT ADLT (ELECTROSURGICAL)
ELECTRODE REM PT RTRN 9FT ADLT (ELECTROSURGICAL) IMPLANT
GAUZE SPONGE 4X4 12PLY STRL (GAUZE/BANDAGES/DRESSINGS) ×3 IMPLANT
GAUZE XEROFORM 5X9 LF (GAUZE/BANDAGES/DRESSINGS) ×3 IMPLANT
GLOVE BIOGEL PI IND STRL 8 (GLOVE) ×2 IMPLANT
GLOVE BIOGEL PI INDICATOR 8 (GLOVE) ×4
GLOVE ORTHO TXT STRL SZ7.5 (GLOVE) ×6 IMPLANT
GOWN STRL REUS W/ TWL LRG LVL3 (GOWN DISPOSABLE) ×1 IMPLANT
GOWN STRL REUS W/ TWL XL LVL3 (GOWN DISPOSABLE) ×1 IMPLANT
GOWN STRL REUS W/TWL 2XL LVL3 (GOWN DISPOSABLE) ×3 IMPLANT
GOWN STRL REUS W/TWL LRG LVL3 (GOWN DISPOSABLE) ×2
GOWN STRL REUS W/TWL XL LVL3 (GOWN DISPOSABLE) ×2
HANDPIECE INTERPULSE COAX TIP (DISPOSABLE)
KIT BASIN OR (CUSTOM PROCEDURE TRAY) ×3 IMPLANT
KIT ROOM TURNOVER OR (KITS) ×3 IMPLANT
KIT STIMULAN RAPID CURE  10CC (Orthopedic Implant) ×2 IMPLANT
KIT STIMULAN RAPID CURE 10CC (Orthopedic Implant) ×1 IMPLANT
MANIFOLD NEPTUNE II (INSTRUMENTS) ×3 IMPLANT
NS IRRIG 1000ML POUR BTL (IV SOLUTION) ×3 IMPLANT
PACK TOTAL JOINT (CUSTOM PROCEDURE TRAY) ×3 IMPLANT
PACK UNIVERSAL I (CUSTOM PROCEDURE TRAY) ×3 IMPLANT
PAD ARMBOARD 7.5X6 YLW CONV (MISCELLANEOUS) ×6 IMPLANT
SET HNDPC FAN SPRY TIP SCT (DISPOSABLE) IMPLANT
SPONGE LAP 18X18 X RAY DECT (DISPOSABLE) ×3 IMPLANT
STAPLER VISISTAT 35W (STAPLE) ×3 IMPLANT
SUT PDS AB 1 CT  36 (SUTURE)
SUT PDS AB 1 CT 36 (SUTURE) IMPLANT
SUT VIC AB 0 CT1 27 (SUTURE) ×4
SUT VIC AB 0 CT1 27XBRD ANBCTR (SUTURE) ×2 IMPLANT
SUT VIC AB 2-0 CT1 27 (SUTURE) ×4
SUT VIC AB 2-0 CT1 TAPERPNT 27 (SUTURE) ×2 IMPLANT
SWAB CULTURE ESWAB REG 1ML (MISCELLANEOUS) IMPLANT
TOWEL OR 17X24 6PK STRL BLUE (TOWEL DISPOSABLE) ×3 IMPLANT
TOWEL OR 17X26 10 PK STRL BLUE (TOWEL DISPOSABLE) ×3 IMPLANT
UNDERPAD 30X30 (UNDERPADS AND DIAPERS) ×3 IMPLANT
WATER STERILE IRR 1000ML POUR (IV SOLUTION) ×3 IMPLANT

## 2017-03-05 NOTE — H&P (View-Only) (Signed)
   Subjective: Day of Surgery Procedure(s) (LRB): LEFT HIP IRRIGATION AND DEBRIDEMENT AND PLACEMENT OF ANTIBIOTIC BEADS (Left) Patient reports pain as moderate.  Left hospital for SNF . EMS found pt in bed with drainage , confused, dehydrated with high WBC.   Objective: Vital signs in last 24 hours: Temp:  [97.7 F (36.5 C)-98.1 F (36.7 C)] 98.1 F (36.7 C) (03/01 0300) Pulse Rate:  [73-118] 73 (03/01 0300) Resp:  [13-30] 16 (03/01 0300) BP: (139-164)/(58-88) 153/73 (03/01 0300) SpO2:  [93 %-98 %] 98 % (03/01 0300) Weight:  [163 lb 2.2 oz (74 kg)] 163 lb 2.2 oz (74 kg) (03/01 0457)  Intake/Output from previous day: 02/28 0701 - 03/01 0700 In: 3381.7 [P.O.:480; I.V.:801.7; IV Piggyback:2100] Out: 0  Intake/Output this shift: No intake/output data recorded.  Recent Labs    03/04/17 0602  HGB 9.5*   Recent Labs    03/04/17 0602  WBC 34.0*  RBC 3.76*  HCT 30.8*  PLT 407*   Recent Labs    03/04/17 0602  NA 131*  K 4.0  CL 94*  CO2 22  BUN 33*  CREATININE 1.41*  GLUCOSE 212*  CALCIUM 10.7*   No results for input(s): LABPT, INR in the last 72 hours.  serosang drainage from hip.   CT images reviewed.  Dg Chest 2 View  Result Date: 03/04/2017 CLINICAL DATA:  84 y/o  M; preop evaluation. EXAM: CHEST  2 VIEW COMPARISON:  03/04/2017 chest radiograph FINDINGS: Stable cardiomegaly given projection and technique. Post median sternotomy with wires in alignment. Aortic atherosclerosis with calcification. Stable mild biapical pleuroparenchymal scarring. Pulmonary vascular congestion. No consolidation. No pleural effusion or pneumothorax. No acute osseous abnormality is evident. IMPRESSION: Stable cardiomegaly. Mild pulmonary vascular congestion. No consolidation. Aortic atherosclerosis. Electronically Signed   By: Kristine Garbe M.D.   On: 03/04/2017 23:55    Assessment/Plan: Day of Surgery Procedure(s) (LRB): LEFT HIP IRRIGATION AND DEBRIDEMENT AND PLACEMENT OF  ANTIBIOTIC BEADS (Left) IV ABX.  He was at home in bed with drainage, confused. Has BKA on other side and needs to be in SNF for diabetic care.  Not able to care for himself.   [name] 03/05/2017, 9:26 AM

## 2017-03-05 NOTE — Progress Notes (Signed)
   Subjective: Day of Surgery Procedure(s) (LRB): LEFT HIP IRRIGATION AND DEBRIDEMENT AND PLACEMENT OF ANTIBIOTIC BEADS (Left) Patient reports pain as moderate.  Left hospital for SNF . EMS found pt in bed with drainage , confused, dehydrated with high WBC.   Objective: Vital signs in last 24 hours: Temp:  [97.7 F (36.5 C)-98.1 F (36.7 C)] 98.1 F (36.7 C) (03/01 0300) Pulse Rate:  [73-118] 73 (03/01 0300) Resp:  [13-30] 16 (03/01 0300) BP: (139-164)/(58-88) 153/73 (03/01 0300) SpO2:  [93 %-98 %] 98 % (03/01 0300) Weight:  [163 lb 2.2 oz (74 kg)] 163 lb 2.2 oz (74 kg) (03/01 0457)  Intake/Output from previous day: 02/28 0701 - 03/01 0700 In: 3381.7 [P.O.:480; I.V.:801.7; IV Piggyback:2100] Out: 0  Intake/Output this shift: No intake/output data recorded.  Recent Labs    03/04/17 0602  HGB 9.5*   Recent Labs    03/04/17 0602  WBC 34.0*  RBC 3.76*  HCT 30.8*  PLT 407*   Recent Labs    03/04/17 0602  NA 131*  K 4.0  CL 94*  CO2 22  BUN 33*  CREATININE 1.41*  GLUCOSE 212*  CALCIUM 10.7*   No results for input(s): LABPT, INR in the last 72 hours.  serosang drainage from hip.   CT images reviewed.  Dg Chest 2 View  Result Date: 03/04/2017 CLINICAL DATA:  84 y/o  M; preop evaluation. EXAM: CHEST  2 VIEW COMPARISON:  03/04/2017 chest radiograph FINDINGS: Stable cardiomegaly given projection and technique. Post median sternotomy with wires in alignment. Aortic atherosclerosis with calcification. Stable mild biapical pleuroparenchymal scarring. Pulmonary vascular congestion. No consolidation. No pleural effusion or pneumothorax. No acute osseous abnormality is evident. IMPRESSION: Stable cardiomegaly. Mild pulmonary vascular congestion. No consolidation. Aortic atherosclerosis. Electronically Signed   By: Kristine Garbe M.D.   On: 03/04/2017 23:55    Assessment/Plan: Day of Surgery Procedure(s) (LRB): LEFT HIP IRRIGATION AND DEBRIDEMENT AND PLACEMENT OF  ANTIBIOTIC BEADS (Left) IV ABX.  He was at home in bed with drainage, confused. Has BKA on other side and needs to be in SNF for diabetic care.  Not able to care for himself.   [name] 03/05/2017, 9:26 AM

## 2017-03-05 NOTE — Anesthesia Procedure Notes (Signed)

## 2017-03-05 NOTE — Progress Notes (Signed)
Orthopedic Tech Progress Note Patient Details:  [name] 1931-02-26 [id number]  Ortho Devices Ortho Device/Splint Location: Trapeze bar   Post Interventions Patient Tolerated: Unable to use device properly   [place] Pink 03/05/2017, 2:59 PM

## 2017-03-05 NOTE — Op Note (Signed)
Preop diagnosis: Postop deep left hip hemiarthroplasty infection.  Postop diagnosis: Same  Procedure: Irrigation and debridement of deep postop wound infection left hip.  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  His diabetes is been poorly controlled and he had a left hip hemiarthroplasty done 02/24/2017.  He was discharged to a skilled nursing facility.  Patient was brought by EMS after being found at home in bed with draining hip crying for help.  He was taken to [facility] ER where a culture was obtained and he was started on antibiotics.  There was bloody seropurulent drainage from his hip and he had dehydration, elevated creatinine and white blood count of 34,000.  Strain urine was clear.  He is brought to the operating room for irrigation and debridement of deep postop left hip infection.  Lab work in the ER showed hyperglycemia.  Procedure: After induction general anesthesia patient was placed in the lateral position with standard prepping with DuraPrep after 1015 drape was placed.  Staples were intact there was no erythema but squeezing the wound purulent drainage was present.  After the usual split sheets drapes impervious stockinette Coban and starting at his BKA stump to the mid thigh was applied timeout procedure was completed.  Patient was already on double antibiotics.  Staples were removed and initially a deep aerobic culture was obtained.  Sutures were harvested which were Vicryl and infection extended down to the hemiarthroplasty.  3 L pulse lavage was performed and sharp excisional debridement was performed with pickups, scissors scalpel, curettes and Leksell  rongeur.  Debridement of subcutaneous tissue, some muscle deep capsule was performed.  Pulse lavage 3 L was performed and and now with clean wound the Prostalac beads that had vancomycin and gentamicin were mixed together placed into the wound.  Interrupted #1 vertical mattress nylon sutures were used followed by incisional VAC.  Patient tolerated the procedure well transferred to recovery room in stable condition.  Infectious disease consultation will be performed and adjustment of his antibiotics based on cultures.  Gram stain from the emergency room showed gram-positive cocci in pairs.

## 2017-03-05 NOTE — Consult Note (Signed)
[name] for Infectious Disease    Date of Admission:  03/04/2017     Total days of antibiotics 2  Vancomycin                Reason for Consult: Bacteremia and early post-op infection of hip   Referring Provider: [place] Crane Primary Care Provider: [name], MD   Assessment: 84 y.o. male POD 9 left hemiarthroplasty following femoral neck fracture performed on 2/20 by Dr. Lorin Mercy. He presented to ED via EMS after family member found him confused, febrile and dehydrated. Blood cultures revealed a staphylococcus species with methicillin resistance on biofire. GPCs in 3/4 bottles and GPCs in pairs on wound sample GS. WBC elevated at 34.   Plan: 1. Continue vancomycin. Narrow as able with culture data.  2. Stop zosyn  3. Repeat BCx in Am.  4. He has retained hardware in setting of early post operative infection now S/P I&D. Would not do rifampin therapy in this patient as I worry about safety on coumadin with lack of home supervision.  5. If no plan to remove hardware would suspect he needs long term suppression 6. He cannot be at home by himself and manage a picc line - plan for long term treatment depends on where his disposition will be after hospitalization.   Dr. Johnnye Sima is available over the weekend for questions and will check culture data and adjust as needed.     Principal Problem:   Wound infection after surgery Active Problems:   Leukocytosis   Chronic diastolic CHF (congestive heart failure) (HCC)   Acute renal failure superimposed on stage 3 chronic kidney disease (HCC)   Atrial fibrillation with normal ventricular rate (HCC)   CAD (coronary artery disease)   Anemia in chronic kidney disease (CKD)   Sepsis (HCC)   Acute delirium   . [START ON 03/06/2017] apixaban  5 mg Oral BID  . docusate sodium  100 mg Oral BID  . finasteride  5 mg Oral Daily  . FLUoxetine  20 mg Oral Daily  . insulin aspart  0-9 Units Subcutaneous Q6H  . insulin glargine  10  Units Subcutaneous QHS  . metoprolol succinate  25 mg Oral Daily  . pantoprazole  40 mg Oral Daily  . sodium chloride flush  3 mL Intravenous Q12H  . tamsulosin  0.4 mg Oral Daily    HPI: [name] is a 84 y.o. male with past medical history as below presenting to ED from home on 2/28 after he was found to be in bed febrile, confused, dehydrated with bloody drainage around his left hip incision (2/20 fracture repair by Dr. Lorin Mercy). He was recently discharged from [facility] on 2/23 to home after he refused recommendations to go to [facility]. He has been in bed since discharge home and lives alone, however had some family checking on him intermittently.   Hospital Course: upon arrival to the ED he was found to have leukocytosis (#34k), lactic acidosis to 2.4, hip pain on the left. Blood cultures were drawn 2/28 and BCID with staph species with detected methicillin resistance. He has gram positive cocci growing in 4 bottles. Superficial wound culture with few GPC in pairs.   Op Note: staples intact but purulent drainage present. Sharp excisional debridement performed and pulse lavage of tissues performed. Some muscle deep capsule debridement was performed as well with pulse lavage. Antibiotic beads with vanc/gent were placed into the wound. Hardware retained.  Presently he is confused and would like for me to help him get home. He knows he is in the hospital but uncertain as to what has happened. Can tell me they "fixed his hip again" but he feels no better.   Review of Systems: Review of Systems  Unable to perform ROS: Other  Patient is confused presently and does not recall much before coming to the hospital.   Past Medical History:  Diagnosis Date  . BPH (benign prostatic hyperplasia)   . CAD (coronary artery disease)    Multivessel status post CABG 2011 - LIMA to LAD, SVG to diagonal, SVG to OM, SVG to PDA  . Cellulitis    12/15  . Chronic back pain   . Chronic diastolic CHF  (congestive heart failure) ([place])   . CKD (chronic kidney disease), stage III ([place])   . Closed hip fracture ([place]) 02/2017  . COPD (chronic obstructive pulmonary disease) ([place])   . Essential hypertension   . Gastric mass    EGD 9/15  . GERD (gastroesophageal reflux disease)   . History of DVT (deep vein thrombosis)    Postphlebitic syndrome  . History of kidney stones   . HOH (hard of hearing)   . Hx of CABG   . Hyperlipidemia   . Persistent atrial fibrillation ([place])    a. s/p DCCV 03/2016.  [name] Sleep apnea    Stop Bang score of 5  . Type 2 diabetes mellitus ([place])   . UTI (urinary tract infection) 11/2016    Social History   Tobacco Use  . Smoking status: Former Smoker    Packs/day: 1.00    Years: 11.00    Pack years: 11.00    Last attempt to quit: 01/05/1956    Years since quitting: 61.2  . Smokeless tobacco: Never Used  Substance Use Topics  . Alcohol use: No  . Drug use: No    Family History  Problem Relation Age of Onset  . Colon cancer Son 51       deceased   No Known Allergies  OBJECTIVE: Blood pressure 140/77, pulse 94, temperature (!) 97.3 F (36.3 C), resp. rate 18, height 5\' 7"  (1.702 m), weight 163 lb 2.2 oz (74 kg), SpO2 97 %.  Physical Exam  Constitutional:  Elderly chronically ill appearing man  HENT:  Mouth/Throat: Oropharynx is clear and moist.  Eyes: Pupils are equal, round, and reactive to light. No scleral icterus.  Neck: Neck supple.  Cardiovascular: Normal rate. An irregularly irregular rhythm present.  No murmur heard. Pulmonary/Chest: Effort normal and breath sounds normal. No respiratory distress. He has no rales.  Abdominal: Soft. Bowel sounds are normal. He exhibits no distension.  Musculoskeletal:  Wound vac in place to left hip. Edema to surrounding tissues but no erythema. VAC intact. Minimal serosang drainage.   Lymphadenopathy:    He has no cervical adenopathy.  Neurological: He is alert. He is disoriented.  Skin: Skin is warm  and dry.  Small ulcer to the right great toe tip   Psychiatric: Affect normal.    Lab Results Lab Results  Component Value Date   WBC 34.0 (H) 03/04/2017   HGB 9.5 (L) 03/04/2017   HCT 30.8 (L) 03/04/2017   MCV 81.9 03/04/2017   PLT 407 (H) 03/04/2017    Lab Results  Component Value Date   CREATININE 1.41 (H) 03/04/2017   BUN 33 (H) 03/04/2017   NA 131 (L) 03/04/2017   K 4.0 03/04/2017   CL 94 (  L) 03/04/2017   CO2 22 03/04/2017    Lab Results  Component Value Date   ALT 14 (L) 03/04/2017   AST 20 03/04/2017   ALKPHOS 155 (H) 03/04/2017   BILITOT 1.7 (H) 03/04/2017     Microbiology: BCx 2/28 > GPCs in 3/4 bottles    Janene Madeira, MSN, NP-C [facility] [place] Cell: [phone] Pager: [phone]  03/05/2017 1:51 PM

## 2017-03-05 NOTE — Transfer of Care (Signed)
Immediate Anesthesia Transfer of Care Note  Patient: [name]  Procedure(s) Performed: LEFT HIP IRRIGATION AND DEBRIDEMENT AND PLACEMENT OF ANTIBIOTIC BEADS (Left Hip)  Patient Location: PACU  Anesthesia Type:General  Level of Consciousness: awake, alert , oriented and sedated  Airway & Oxygen Therapy: Patient Spontanous Breathing and Patient connected to nasal cannula oxygen  Post-op Assessment: Report given to RN, Post -op Vital signs reviewed and stable and Patient moving all extremities  Post vital signs: Reviewed and stable  Last Vitals:  Vitals:   03/05/17 0925 03/05/17 1210  BP: (!) 147/86   Pulse: (!) 125   Resp:    Temp:  (P) 36.6 C  SpO2:      Last Pain:  Vitals:   03/05/17 0300  TempSrc: Oral  PainSc:       Patients Stated Pain Goal: 0 (11/55/20 8022)  Complications: No apparent anesthesia complications

## 2017-03-05 NOTE — Interval H&P Note (Signed)
History and Physical Interval Note:  03/05/2017 10:44 AM  [name]  has presented today for surgery, with the diagnosis of LEFT HIP INFECTION  The various methods of treatment have been discussed with the patient and family. After consideration of risks, benefits and other options for treatment, the patient has consented to  Procedure(s): LEFT HIP IRRIGATION AND DEBRIDEMENT AND PLACEMENT OF ANTIBIOTIC BEADS (Left) as a surgical intervention .  The patient's history has been reviewed, patient examined, no change in status, stable for surgery.  I have reviewed the patient's chart and labs.  Questions were answered to the patient's satisfaction.     [name]

## 2017-03-05 NOTE — Progress Notes (Signed)
PROGRESS NOTE  [name] PZW:[id number] DOB: 1931-07-08 DOA: 03/04/2017 PCP: [name], MD  HPI/Recap of past 24 hours: [name] is a 84 y.o. male from home where he was found in his bed with a lot of bloody drainage from around his surgical site where he had a left hip surgery done on February 20 by Dr. Lorin Mercy. Admitted for acute metabolic encephalopathy and sepsis 2/2 to surgical wound infection.  03/05/17. Orthopedic surgery consulted and following. POD #0 post I%D of left hip. Patient altered mental status is improving. He is alert and oriented x 2 and less agitated at the time of this visit. Admits to pain in his left hip 8/10. Has no other complaints.   Assessment/Plan: Principal Problem:   Wound infection after surgery Active Problems:   Leukocytosis   Chronic diastolic CHF (congestive heart failure) (HCC)   Acute renal failure superimposed on stage 3 chronic kidney disease (HCC)   Atrial fibrillation with normal ventricular rate (HCC)   CAD (coronary artery disease)   Anemia in chronic kidney disease (CKD)   Sepsis (HCC)   Acute delirium  Acute metabolic encephalopathy most likley 2/2 to sepsis from surgical woubnd infection -resolving -continue to reorient as needed  Sepsis 2/2 to surgical wound infection POD # 0 post I&D -recent left hip surgery 02/24/17 -post I&D with placement of Prostalac absorbable vancomycin and gentamicin beads by Dr Lorin Mercy -continue close monitoring -orthopedic surgery following  Recent left hip surgery 02/24/17 -orthopedic surgery following -pain management along with bowel regimen in place  AKI on CKD3 -baseline cr 1.1 -cr 1.41 -avoid nephrotoxic agents, hypotensin, dehydration -gentle IV hydration -monitor urine output  Chronic a-fib -rate controlled on metoprolol -continue home meds -holding eliquis -defer to orthopedic surgery to restart eliquis post I&D  Chronic anemia -stable -Hg  -CBC  am  DM2 -A1C -ISS -avoid hypoglycemia  Ambulatory dysfunction due to recent surgery -PT to evaluate and treat -fall precaution -may need SNF when stable for discharge  leukocytosis -wbc from 21 to 34k -most likely 2/2 to wound infection -repeat CBC  Depression/anxiety -prozac  BPH -finasteride, tamsulosin -monitor urine output   Code Status: Partial  Family Communication: none at bedside  Disposition Plan: To be determined   Consultants:  Orthopedic surgery  Procedures:  Left hip I&D  Antimicrobials:  IV vancomycin and IV zosyn  DVT prophylaxis:  SCDs    Objective: Vitals:   03/04/17 2212 03/05/17 0300 03/05/17 0456 03/05/17 0457  BP: 139/60 (!) 153/73    Pulse: 94 73    Resp: 17 16    Temp: 97.7 F (36.5 C) 98.1 F (36.7 C)    TempSrc: Oral Oral    SpO2: 97% 98%    Weight:    74 kg (163 lb 2.2 oz)  Height:   5\' 7"  (1.702 m)     Intake/Output Summary (Last 24 hours) at 03/05/2017 0714 Last data filed at 03/05/2017 4235 Gross per 24 hour  Intake 3381.66 ml  Output 0 ml  Net 3381.66 ml   Filed Weights   03/05/17 0457  Weight: 74 kg (163 lb 2.2 oz)    Exam:   General:  84 yo CM WD wN NAD a&O x 2  Cardiovascular: RRR no rubs or gallops   Respiratory: CTA no wheezes or rales  Abdomen: soft NT ND NBS x4   Musculoskeletal: no focal abnormalities  Skin: site of surgical wound is covered with surgical dressing. No draining noted.  Psychiatry: Mood is  appropriate for condition and setting   Data Reviewed: CBC: Recent Labs  Lab 02/26/17 0717 02/27/17 0541 03/04/17 0602  WBC 12.9* 21.6* 34.0*  NEUTROABS  --   --  32.0*  HGB 10.1* 9.4* 9.5*  HCT 30.9* 29.6* 30.8*  MCV 81.3 80.4 81.9  PLT 222 223 371*   Basic Metabolic Panel: Recent Labs  Lab 02/26/17 0717 02/27/17 0541 03/04/17 0602  NA 133* 133* 131*  K 3.6 3.7 4.0  CL 99* 98* 94*  CO2 22 23 22   GLUCOSE 205* 191* 212*  BUN 30* 27* 33*  CREATININE 1.45* 1.25* 1.41*   CALCIUM 10.0 9.8 10.7*   GFR: Estimated Creatinine Clearance: 37.1 mL/min (A) (by C-G formula based on SCr of 1.41 mg/dL (H)). Liver Function Tests: Recent Labs  Lab 03/04/17 0602  AST 20  ALT 14*  ALKPHOS 155*  BILITOT 1.7*  PROT 6.6  ALBUMIN 2.6*   No results for input(s): LIPASE, AMYLASE in the last 168 hours. No results for input(s): AMMONIA in the last 168 hours. Coagulation Profile: No results for input(s): INR, PROTIME in the last 168 hours. Cardiac Enzymes: No results for input(s): CKTOTAL, CKMB, CKMBINDEX, TROPONINI in the last 168 hours. BNP (last 3 results) No results for input(s): PROBNP in the last 8760 hours. HbA1C: No results for input(s): HGBA1C in the last 72 hours. CBG: Recent Labs  Lab 02/26/17 1711 02/26/17 2223 02/27/17 0732 02/27/17 1141 03/05/17 0656  GLUCAP 231* 221* 191* 186* 197*   Lipid Profile: No results for input(s): CHOL, HDL, LDLCALC, TRIG, CHOLHDL, LDLDIRECT in the last 72 hours. Thyroid Function Tests: No results for input(s): TSH, T4TOTAL, FREET4, T3FREE, THYROIDAB in the last 72 hours. Anemia Panel: No results for input(s): VITAMINB12, FOLATE, FERRITIN, TIBC, IRON, RETICCTPCT in the last 72 hours. Urine analysis:    Component Value Date/Time   COLORURINE YELLOW 03/04/2017 0544   APPEARANCEUR HAZY (A) 03/04/2017 0544   APPEARANCEUR Cloudy (A) 11/12/2016 1101   LABSPEC 1.012 03/04/2017 0544   PHURINE 5.0 03/04/2017 0544   GLUCOSEU 50 (A) 03/04/2017 0544   HGBUR SMALL (A) 03/04/2017 0544   BILIRUBINUR NEGATIVE 03/04/2017 0544   BILIRUBINUR Negative 11/12/2016 1101   KETONESUR 5 (A) 03/04/2017 0544   PROTEINUR 100 (A) 03/04/2017 0544   UROBILINOGEN 0.2 12/19/2013 1905   NITRITE NEGATIVE 03/04/2017 0544   LEUKOCYTESUR NEGATIVE 03/04/2017 0544   LEUKOCYTESUR 2+ (A) 11/12/2016 1101   Sepsis Labs: @LABRCNTIP (procalcitonin:4,lacticidven:4)  ) Recent Results (from the past 240 hour(s))  Surgical pcr screen     Status:  Abnormal   Collection Time: 02/24/17  4:48 AM  Result Value Ref Range Status   MRSA, PCR POSITIVE (A) NEGATIVE Final    Comment: RESULT CALLED TO, READ BACK BY AND VERIFIED WITH: C BULIAA RN 02/24/17 0649 JDW    Staphylococcus aureus POSITIVE (A) NEGATIVE Final    Comment: RESULT CALLED TO, READ BACK BY AND VERIFIED WITH: COren Binet RN 02/24/17 [phone] JDW (NOTE) The Xpert SA Assay (FDA approved for NASAL specimens in patients 64 years of age and older), is one component of a comprehensive surveillance program. It is not intended to diagnose infection nor to guide or monitor treatment. Performed at [facility], [place] [address] [place], [place] [address]   Culture, blood (routine x 2)     Status: None (Preliminary result)   Collection Time: 03/04/17  6:03 AM  Result Value Ref Range Status   Specimen Description BLOOD RIGHT ARM  Final   Special Requests  Final    BOTTLES DRAWN AEROBIC AND ANAEROBIC Blood Culture adequate volume   Culture  Setup Time   Final    GRAM POSITIVE COCCI ANAEROBIC BOTTLE ONLY Gram Stain Report Called to,Read Back By and Verified With: MURPHY,N @0545  ON 3.1.19 BY BOWMAN,L    Culture   Final    NO GROWTH < 24 HOURS Performed at [facility], [address] [place], [place] [address]    Report Status PENDING  Incomplete  Culture, blood (routine x 2)     Status: None (Preliminary result)   Collection Time: 03/04/17  6:05 AM  Result Value Ref Range Status   Specimen Description BLOOD RIGHT FOREARM DRAWN BY RN  Final   Special Requests   Final    BOTTLES DRAWN AEROBIC AND ANAEROBIC Blood Culture adequate volume   Culture  Setup Time   Final    GRAM POSITIVE COCCI IN BOTH AEROBIC AND ANAEROBIC BOTTLES Gram Stain Report Called to,Read Back By and Verified With: MURPHY,N @ 6301 ON 3.1.19 BY BOWMAN,L    Culture   Final    NO GROWTH < 24 HOURS Performed at [facility], [address] [place], [place] [address]    Report Status PENDING  Incomplete   Aerobic Culture (superficial specimen)     Status: None (Preliminary result)   Collection Time: 03/04/17  8:48 AM  Result Value Ref Range Status   Specimen Description   Final    HIP LEFT Performed at [facility], [address], [place], [place] [address]    Special Requests   Final    Immunocompromised Performed at [facility], [address] [place], [place] [address]    Gram Stain   Final    RARE WBC PRESENT, PREDOMINANTLY PMN FEW GRAM POSITIVE COCCI IN PAIRS Performed at [facility], [place] [address] [place], [place] [address]    Culture PENDING  Incomplete   Report Status PENDING  Incomplete      Studies: Dg Chest 2 View  Result Date: 03/04/2017 CLINICAL DATA:  84 y/o  M; preop evaluation. EXAM: CHEST  2 VIEW COMPARISON:  03/04/2017 chest radiograph FINDINGS: Stable cardiomegaly given projection and technique. Post median sternotomy with wires in alignment. Aortic atherosclerosis with calcification. Stable mild biapical pleuroparenchymal scarring. Pulmonary vascular congestion. No consolidation. No pleural effusion or pneumothorax. No acute osseous abnormality is evident. IMPRESSION: Stable cardiomegaly. Mild pulmonary vascular congestion. No consolidation. Aortic atherosclerosis. Electronically Signed   By: Kristine Garbe M.D.   On: 03/04/2017 23:55   Ct Hip Left W Contrast  Result Date: 03/04/2017 CLINICAL DATA:  Pt arrived from home via [facility]. Pt was found in bed this morning with serosanguinous drainage around him draining from recent surgery on his left hip. EXAM: CT OF THE LOWER LEFT EXTREMITY WITH CONTRAST TECHNIQUE: Multidetector CT imaging of the lower left extremity was performed according to the standard protocol following intravenous contrast administration. COMPARISON:  None. CONTRAST:  171mL ISOVUE-300 IOPAMIDOL (ISOVUE-300) INJECTION 61% FINDINGS: Bones/Joint/Cartilage Interval total left hip arthroplasty. Alignment is anatomic. No hardware failure  or complication. Beam hardening artifact resulting from the orthopedic hardware partially obscures adjacent soft tissue and osseous structures. No fracture or dislocation. Normal alignment. No joint effusion. Ligaments Ligaments are suboptimally evaluated by CT. Muscles and Tendons Ill-defined area complex fluid along the greater trochanter with multiple locules of air within the fluid which may reflect a postoperative hematoma, but a secondarily infected collection cannot be completely excluded. Small volume of air within left  gluteal musculature and along the superficial aspect of the vastus lateralis which may be postsurgical, but infection cannot be excluded given surgery was performed 8 days ago. Soft tissue Severe soft tissue edema in the subcutaneous fat with skin thickening overlying the left hip which may reflect postsurgical edema versus cellulitis. No soft tissue mass. Fat containing left inguinal hernia. IMPRESSION: 1. Ill-defined area complex fluid along the greater trochanter with multiple locules of air within the fluid which may reflect a postoperative hematoma, but a secondarily infected collection cannot be completely excluded. 2. Small volume of air within left gluteal musculature and along the superficial aspect of the vastus lateralis which may be postsurgical, but infection cannot be excluded given surgery was performed 8 days ago. 3. Interval total left hip arthroplasty. Alignment is anatomic. No hardware failure or complication. Beam hardening artifact resulting from the orthopedic hardware partially obscures adjacent soft tissue and osseous structures. Electronically Signed   By: Kathreen Devoid   On: 03/04/2017 08:06    Scheduled Meds: . finasteride  5 mg Oral Daily  . FLUoxetine  20 mg Oral Daily  . insulin aspart  0-9 Units Subcutaneous Q6H  . insulin glargine  10 Units Subcutaneous QHS  . metoprolol succinate  25 mg Oral Daily  . pantoprazole  40 mg Oral Daily  . sodium chloride  flush  3 mL Intravenous Q12H  . tamsulosin  0.4 mg Oral Daily    Continuous Infusions: . sodium chloride 100 mL/hr at 03/04/17 2246  . piperacillin-tazobactam (ZOSYN)  IV 3.375 g (03/05/17 0647)  . vancomycin 750 mg (03/05/17 0647)     LOS: 1 day     [name], MD [facility] Pager [phone]  If 7PM-7AM, please contact night-coverage [email or web address] Password [facility] 03/05/2017, 7:14 AM

## 2017-03-05 NOTE — Care Management Note (Signed)
Case Management Note  Patient Details  Name: [name] MRN: [id number] Date of Birth: 29-Jul-1931  Subjective/Objective: status post hip fracture repair on 02/27/17 admitted for Wound infection after surgery.  reportedly supposed to discharge to [facility] but patient refused and went home and laid in bed most of time.   Action/Plan: Prior to admission patient lives at home alone.  Arrived via EMS.  He does have family that checks in on him intermittently.  CT left lower extremity possible postop hematoma and describes a complex fluid collection along the greater trochanter that is suspicious for infection. Ortho consulted: 03/05/1017 LEFT HIP IRRIGATION AND DEBRIDEMENT AND PLACEMENT OF ANTIBIOTIC BEADS  Expected Discharge Date:                  Expected Discharge Plan:  Skilled Nursing Facility  In-House Referral:  Clinical Social Work  Discharge planning Services  CM Consult  Status of Service:  In process, will continue to follow  Kristen Cardinal, RN  Nurse case [place] 03/05/2017, 11:46 AM

## 2017-03-05 NOTE — Progress Notes (Signed)
PHARMACY - PHYSICIAN COMMUNICATION CRITICAL VALUE ALERT - BLOOD CULTURE IDENTIFICATION (BCID)  [name] is an 84 y.o. male who presented to [facility] on 03/04/2017 with a chief complaint of left hip infection after a recent hip hemoarthroplasty.  Assessment:  He is now growing methicillin-resistant Coagulase negative Staphylcocci in 3 of his 4 blood culture bottles.  This likely represents true bacteremia related to his hip infection.  He is currently on Vancomycin which is the drug of choice.    Name of physician (or Provider) Contacted: Dr. Nevada Crane via Amion   Current antibiotics: Vancomycin and Zosyn  Changes to prescribed antibiotics recommended:  Patient is on recommended antibiotics - No changes needed  Consider dc Zosyn if clinically appropriate - note patient is in the OR currently for I&D.  Results for orders placed or performed during the hospital encounter of 03/04/17  Blood Culture ID Panel (Reflexed) (Collected: 03/04/2017  6:05 AM)  Result Value Ref Range   Enterococcus species NOT DETECTED NOT DETECTED   Listeria monocytogenes NOT DETECTED NOT DETECTED   Staphylococcus species DETECTED (A) NOT DETECTED   Staphylococcus aureus NOT DETECTED NOT DETECTED   Methicillin resistance DETECTED (A) NOT DETECTED   Streptococcus species NOT DETECTED NOT DETECTED   Streptococcus agalactiae NOT DETECTED NOT DETECTED   Streptococcus pneumoniae NOT DETECTED NOT DETECTED   Streptococcus pyogenes NOT DETECTED NOT DETECTED   Acinetobacter baumannii NOT DETECTED NOT DETECTED   Enterobacteriaceae species NOT DETECTED NOT DETECTED   Enterobacter cloacae complex NOT DETECTED NOT DETECTED   Escherichia coli NOT DETECTED NOT DETECTED   Klebsiella oxytoca NOT DETECTED NOT DETECTED   Klebsiella pneumoniae NOT DETECTED NOT DETECTED   Proteus species NOT DETECTED NOT DETECTED   Serratia marcescens NOT DETECTED NOT DETECTED   Haemophilus influenzae NOT DETECTED NOT DETECTED   Neisseria  meningitidis NOT DETECTED NOT DETECTED   Pseudomonas aeruginosa NOT DETECTED NOT DETECTED   Candida albicans NOT DETECTED NOT DETECTED   Candida glabrata NOT DETECTED NOT DETECTED   Candida krusei NOT DETECTED NOT DETECTED   Candida parapsilosis NOT DETECTED NOT DETECTED   Candida tropicalis NOT DETECTED NOT DETECTED    Legrand Como, Pharm.D., BCPS, BCIDP Clinical Pharmacist Phone: [phone] or [phone] Pager: [phone] 03/05/2017, 10:15 AM

## 2017-03-05 NOTE — Progress Notes (Signed)
Pharmacy Antibiotic Note  [name] is a 84 y.o. male admitted on 03/04/2017 with confusion/hip pain from surgical site from hip surgery done on 2/20. Pharmacy has been consulted for Vancomycin and Zosyn dosing.  Noted blood cultures now growing MRSE in 3 of 4 bottles - likely true pathogen given recent surgery and wound infection. Consider d/cing Zosyn, will increase the Vancomycin dose today to target a VT of 15-20 mcg/ml  Plan: 1. Increase Vancomycin to 1g IV every 24 hours 2. Continue Zosyn 3.375g IV every 8 hours (infused over 4 hours) 3. Will f/u BMET in the AM to evaluate renal function 4. Will continue to follow renal function, culture results, LOT, and antibiotic de-escalation plans   Height: 5\' 7"  (170.2 cm) Weight: 163 lb 2.2 oz (74 kg) IBW/kg (Calculated) : 66.1  Temp (24hrs), Avg:97.8 F (36.6 C), Min:97.3 F (36.3 C), Max:98.1 F (36.7 C)  Recent Labs  Lab 02/27/17 0541 03/04/17 0602 03/04/17 0652 03/04/17 0840  WBC 21.6* 34.0*  --   --   CREATININE 1.25* 1.41*  --   --   LATICACIDVEN  --   --  2.43* 1.0    Estimated Creatinine Clearance: 37.1 mL/min (A) (by C-G formula based on SCr of 1.41 mg/dL (H)).    No Known Allergies  Antimicrobials this admission: Vancomycin 2/28>> Zosyn 2/28>>  Dose adjustments this admission: n/a  Microbiology results: 2/28 BCx >> 3/4 MRSE 3/1 WCx (intra-op) >>  Thank you for allowing pharmacy to be a part of this patient's care.  Alycia Rossetti, PharmD, BCPS Clinical Pharmacist Pager: [phone] Clinical phone for 03/05/2017 from 7a-3:30p: [phone] If after 3:30p, please call main pharmacy at: [phone] 03/05/2017 1:16 PM

## 2017-03-05 NOTE — Anesthesia Preprocedure Evaluation (Addendum)
Anesthesia Evaluation  Patient identified by MRN, date of birth, ID band Patient confused    Reviewed: Allergy & Precautions, NPO status , Patient's Chart, lab work & pertinent test results  Airway Mallampati: III  TM Distance: >3 FB Neck ROM: Full    Dental  (+) Edentulous Lower, Edentulous Upper   Pulmonary sleep apnea , COPD, former smoker,    Pulmonary exam normal breath sounds clear to auscultation       Cardiovascular hypertension, Pt. on medications and Pt. on home beta blockers + CAD, + CABG, +CHF and + DVT  Normal cardiovascular exam Rhythm:Regular Rate:Normal  ECG: A-fib, rate 99  ECHO: Mild to moderate LVH with LVEF 60-65%. Indeterminate diastolic function. Mild left atrial enlargement. Mildly calcified mitral annulus with mild mitral regurgitation. Sclerotic aortic valve without stenosis. Trivial tricuspid regurgitation with PASP estimated 42 mmHg. Prominent pericardial fat pad noted.   Neuro/Psych PSYCHIATRIC DISORDERS Confused    GI/Hepatic Neg liver ROS, GERD  Medicated and Controlled,  Endo/Other  diabetes, Insulin Dependent  Renal/GU negative Renal ROS   BPH    Musculoskeletal Chronic back pain   Abdominal   Peds  Hematology  (+) anemia ,   Anesthesia Other Findings LEFT HIP INFECTION  Reproductive/Obstetrics                           Anesthesia Physical Anesthesia Plan  ASA: III  Anesthesia Plan: General   Post-op Pain Management:    Induction: Intravenous  PONV Risk Score and Plan: 2 and Ondansetron and Treatment may vary due to age or medical condition  Airway Management Planned: Oral ETT  Additional Equipment:   Intra-op Plan:   Post-operative Plan: Extubation in OR  Informed Consent: I have reviewed the patients History and Physical, chart, labs and discussed the procedure including the risks, benefits and alternatives for the proposed anesthesia with  the patient or authorized representative who has indicated his/her understanding and acceptance.   Dental advisory given  Plan Discussed with: CRNA  Anesthesia Plan Comments:         Anesthesia Quick Evaluation

## 2017-03-06 ENCOUNTER — Inpatient Hospital Stay (HOSPITAL_COMMUNITY): Payer: Medicare Other

## 2017-03-06 LAB — COMPREHENSIVE METABOLIC PANEL
ALBUMIN: 2 g/dL — AB (ref 3.5–5.0)
ALK PHOS: 188 U/L — AB (ref 38–126)
ALT: 10 U/L — ABNORMAL LOW (ref 17–63)
AST: 13 U/L — AB (ref 15–41)
Anion gap: 9 (ref 5–15)
BILIRUBIN TOTAL: 1.2 mg/dL (ref 0.3–1.2)
BUN: 19 mg/dL (ref 6–20)
CALCIUM: 10.6 mg/dL — AB (ref 8.9–10.3)
CO2: 25 mmol/L (ref 22–32)
CREATININE: 1.06 mg/dL (ref 0.61–1.24)
Chloride: 100 mmol/L — ABNORMAL LOW (ref 101–111)
GFR calc Af Amer: >60 mL/min (ref >60)
GFR calc non Af Amer: >60 mL/min (ref >60)
GLUCOSE: 182 mg/dL — AB (ref 65–99)
Potassium: 3.5 mmol/L (ref 3.5–5.1)
Sodium: 134 mmol/L — ABNORMAL LOW (ref 135–145)
Total Protein: 5.3 g/dL — ABNORMAL LOW (ref 6.5–8.1)

## 2017-03-06 LAB — CBC WITH DIFFERENTIAL/PLATELET
BASOS ABS: 0 10*3/uL (ref 0.0–0.1)
Basophils Relative: 0 %
EOS ABS: 0.2 10*3/uL (ref 0.0–0.7)
Eosinophils Relative: 1 %
HCT: 27.7 % — ABNORMAL LOW (ref 39.0–52.0)
Hemoglobin: 8.6 g/dL — ABNORMAL LOW (ref 13.0–17.0)
Lymphocytes Relative: 6 %
Lymphs Abs: 1.5 10*3/uL (ref 0.7–4.0)
MCH: 25.4 pg — ABNORMAL LOW (ref 26.0–34.0)
MCHC: 31 g/dL (ref 30.0–36.0)
MCV: 81.7 fL (ref 78.0–100.0)
MONO ABS: 1.2 10*3/uL — AB (ref 0.1–1.0)
Monocytes Relative: 5 %
NEUTROS PCT: 88 %
Neutro Abs: 21.3 10*3/uL — ABNORMAL HIGH (ref 1.7–7.7)
PLATELETS: 449 10*3/uL — AB (ref 150–400)
RBC: 3.39 MIL/uL — AB (ref 4.22–5.81)
RDW: 17 % — ABNORMAL HIGH (ref 11.5–15.5)
WBC: 24.2 10*3/uL — ABNORMAL HIGH (ref 4.0–10.5)

## 2017-03-06 LAB — URINE CULTURE: Culture: 20000 — AB

## 2017-03-06 LAB — GLUCOSE, CAPILLARY
GLUCOSE-CAPILLARY: 169 mg/dL — AB (ref 65–99)
GLUCOSE-CAPILLARY: 197 mg/dL — AB (ref 65–99)
GLUCOSE-CAPILLARY: 204 mg/dL — AB (ref 65–99)
Glucose-Capillary: 201 mg/dL — ABNORMAL HIGH (ref 65–99)
Glucose-Capillary: 156 mg/dL — ABNORMAL HIGH (ref 65–99)

## 2017-03-06 LAB — MAGNESIUM: Magnesium: 1.9 mg/dL (ref 1.7–2.4)

## 2017-03-06 MED ORDER — BISACODYL 10 MG RE SUPP
10.0000 mg | Freq: Once | RECTAL | Status: AC
  Administered 2017-03-06: 10 mg via RECTAL
  Filled 2017-03-06: qty 1

## 2017-03-06 MED ORDER — ENSURE ENLIVE PO LIQD
237.0000 mL | Freq: Two times a day (BID) | ORAL | Status: DC
  Administered 2017-03-06 – 2017-03-12 (×7): 237 mL via ORAL

## 2017-03-06 MED ORDER — SENNOSIDES-DOCUSATE SODIUM 8.6-50 MG PO TABS
1.0000 | ORAL_TABLET | Freq: Two times a day (BID) | ORAL | Status: DC
  Administered 2017-03-06 – 2017-03-17 (×21): 1 via ORAL
  Filled 2017-03-06 (×21): qty 1

## 2017-03-06 NOTE — Progress Notes (Signed)
Subjective: Patient stable.  Pain reasonably well controlled.  Wound VAC had a leak but the nurse sealed that.  Minimal drainage from the wound VAC   Objective: Vital signs in last 24 hours: Temp:  [97.3 F (36.3 C)-98.4 F (36.9 C)] 97.7 F (36.5 C) (03/02 0611) Pulse Rate:  [65-138] 65 (03/02 0611) Resp:  [7-30] 14 (03/02 0611) BP: (130-153)/(53-93) 150/53 (03/02 0611) SpO2:  [96 %-98 %] 96 % (03/02 0611)  Intake/Output from previous day: 03/01 0701 - 03/02 0700 In: 3821.7 [P.O.:600; I.V.:3021.7; IV Piggyback:200] Out: 2000 [Urine:1900; Blood:100] Intake/Output this shift: Total I/O In: 690 [P.O.:240; I.V.:450] Out: -   Exam:  No cellulitis present  Labs: Recent Labs    03/04/17 0602 03/06/17 0622  HGB 9.5* 8.6*   Recent Labs    03/04/17 0602 03/06/17 0622  WBC 34.0* 24.2*  RBC 3.76* 3.39*  HCT 30.8* 27.7*  PLT 407* 449*   Recent Labs    03/04/17 0602 03/06/17 0622  NA 131* 134*  K 4.0 3.5  CL 94* 100*  CO2 22 25  BUN 33* 19  CREATININE 1.41* 1.06  GLUCOSE 212* 182*  CALCIUM 10.7* 10.6*   No results for input(s): LABPT, INR in the last 72 hours.  Assessment/Plan: Impression is infection following hemiarthroplasty on the left-hand side in a diabetic patient.  Plan is infection management with antibiotics.  Debridement has been performed.  Wound VAC in place.  I would add physical therapy just to facilitate transfers.  Dr. Lorin Mercy will see in a.m.   [name] 03/06/2017, 9:34 AM

## 2017-03-06 NOTE — Progress Notes (Addendum)
PROGRESS NOTE    Phillip ZEHNDER  Duncan:[id number] DOB: September 24, 1931 DOA: 03/04/2017 PCP: [name], MD    Brief Narrative: [name] a 84 y.o.malefrom home where he was found in his bed with a lot of bloody drainage from around his surgical site where he had a left hip surgery done on February 20 by Dr. Lorin Mercy. Admitted for acute metabolic encephalopathy and sepsis 2/2 to surgical wound infection.  03/05/17. Orthopedic surgery consulted and following. POD #0 post I%D of left hip. Patient altered mental status is improving. He is alert and oriented x 2 and less agitated at the time of this visit. Admits to pain in his left hip 8/10. Has no other complaints.     Assessment & Plan:   Principal Problem:   Wound infection after surgery Active Problems:   Leukocytosis   Chronic diastolic CHF (congestive heart failure) (HCC)   Acute renal failure superimposed on stage 3 chronic kidney disease (HCC)   Atrial fibrillation with normal ventricular rate (HCC)   CAD (coronary artery disease)   Anemia in chronic kidney disease (CKD)   Sepsis (HCC)   Acute delirium   1-Wound infection post Hemiarthroplasty left hip;  Recent left hip surgery 02/24/17 Underwent I and D left hip and placement of prostalac absorbable vancomycin and gentamycin beads on 3-01 He will need long term antibiotics suppression.  ID following.  Continue with vancomycin.  Cultures from wound; Moderate staph aureus.  WBC trending down 34---24.   Staph Aureus Bacteremia; blood cultures 2-28 and staph not aureus in second set blood culture.  Follow culture results.  On IV antibiotics.  ID following.  Repeated Blood cultures 3-02  Sepsis; secondary to wound post hip replacement.  Vitals stable. On IV antibiotics. Vancomycin. Monitor for nephrotoxicity.   Dyspnea;  Check chest x ray, which was negative.  Nebulizer PRN Incentive spirometry   Acute metabolic encephalopathy;  Related to infection. Appears  calm today,.  Improving.   AKI on CKD stage III;  Baseline cr 1.1.  IV fluids.  Monitor renal function.   Mild hypercalcemia; repeat labs in am.   Chronic A Fib;  Continue with metoprolol.  eliquis was started 3-02  Chronic anemia;  monitor hb.  Hb has been 9 range , trending down today. At 8.6 Monitor closely on eliquis.   DM;  lantus 10 units HS.  SSI.    Depression/anxiety -prozac  BPH -finasteride, tamsulosin -monitor urine output   DVT prophylaxis: eliquis.  Code Status: partial  Family Communication: no family at bedside.  Disposition Plan: needs SNF Consultants:   Ortho   ID   Procedures:   Left Hip I and d   Antimicrobials:  vancomycin    Subjective: He is alert, relates mild SOB. Denies chest pain.    Objective: Vitals:   03/05/17 1325 03/05/17 1800 03/05/17 2210 03/06/17 0611  BP: 140/77 (!) 146/72 (!) 153/71 (!) 150/53  Pulse: 94 89 (!) 120 65  Resp: 18 20 20 14   Temp:  98.4 F (36.9 C) 98.4 F (36.9 C) 97.7 F (36.5 C)  TempSrc:  Oral Oral Oral  SpO2: 97% 97% 98% 96%  Weight:      Height:        Intake/Output Summary (Last 24 hours) at 03/06/2017 0930 Last data filed at 03/06/2017 9937 Gross per 24 hour  Intake 3821.67 ml  Output 2000 ml  Net 1821.67 ml   Filed Weights   03/05/17 0457  Weight: 74 kg (163 lb 2.2  oz)    Examination:  General exam: Appears calm and comfortable  Respiratory system: Clear to auscultation. Respiratory effort normal. Cardiovascular system: S1 & S2 heard, RRR. No JVD, murmurs, rubs, gallops or clicks. No pedal edema. Gastrointestinal system: Abdomen is nondistended, soft and nontender. No organomegaly or masses felt. Normal bowel sounds heard. Central nervous system: Alert, follows command.  Extremities: left hip with wound vac in place.  Skin: No rashes, lesions or ulcers Psychiatry: calm.    Data Reviewed: I have personally reviewed following labs and imaging studies  CBC: Recent  Labs  Lab 03/04/17 0602 03/06/17 0622  WBC 34.0* 24.2*  NEUTROABS 32.0* 21.3*  HGB 9.5* 8.6*  HCT 30.8* 27.7*  MCV 81.9 81.7  PLT 407* 622*   Basic Metabolic Panel: Recent Labs  Lab 03/04/17 0602 03/06/17 0622  NA 131* 134*  K 4.0 3.5  CL 94* 100*  CO2 22 25  GLUCOSE 212* 182*  BUN 33* 19  CREATININE 1.41* 1.06  CALCIUM 10.7* 10.6*  MG  --  1.9   GFR: Estimated Creatinine Clearance: 49.4 mL/min (by C-G formula based on SCr of 1.06 mg/dL). Liver Function Tests: Recent Labs  Lab 03/04/17 0602 03/06/17 0622  AST 20 13*  ALT 14* 10*  ALKPHOS 155* 188*  BILITOT 1.7* 1.2  PROT 6.6 5.3*  ALBUMIN 2.6* 2.0*   No results for input(s): LIPASE, AMYLASE in the last 168 hours. No results for input(s): AMMONIA in the last 168 hours. Coagulation Profile: No results for input(s): INR, PROTIME in the last 168 hours. Cardiac Enzymes: No results for input(s): CKTOTAL, CKMB, CKMBINDEX, TROPONINI in the last 168 hours. BNP (last 3 results) No results for input(s): PROBNP in the last 8760 hours. HbA1C: No results for input(s): HGBA1C in the last 72 hours. CBG: Recent Labs  Lab 03/05/17 0924 03/05/17 1257 03/05/17 1731 03/06/17 0042 03/06/17 0720  GLUCAP 208* 206* 191* 204* 169*   Lipid Profile: No results for input(s): CHOL, HDL, LDLCALC, TRIG, CHOLHDL, LDLDIRECT in the last 72 hours. Thyroid Function Tests: No results for input(s): TSH, T4TOTAL, FREET4, T3FREE, THYROIDAB in the last 72 hours. Anemia Panel: No results for input(s): VITAMINB12, FOLATE, FERRITIN, TIBC, IRON, RETICCTPCT in the last 72 hours. Sepsis Labs: Recent Labs  Lab 03/04/17 2979 03/04/17 0840  LATICACIDVEN 2.43* 1.0    Recent Results (from the past 240 hour(s))  Urine culture     Status: Abnormal   Collection Time: 03/04/17  5:44 AM  Result Value Ref Range Status   Specimen Description   Final    URINE, CATHETERIZED Performed at [facility], [address], [place], [place] [address]     Special Requests   Final    NONE Performed at [facility], [address] [place], [place] [address]    Culture (A)  Final    20,000 COLONIES/mL METHICILLIN RESISTANT STAPHYLOCOCCUS AUREUS   Report Status 03/06/2017 FINAL  Final   Organism ID, Bacteria METHICILLIN RESISTANT STAPHYLOCOCCUS AUREUS (A)  Final      Susceptibility   Methicillin resistant staphylococcus aureus - MIC*    CIPROFLOXACIN >=8 RESISTANT Resistant     GENTAMICIN >=16 RESISTANT Resistant     NITROFURANTOIN <=16 SENSITIVE Sensitive     OXACILLIN >=4 RESISTANT Resistant     TETRACYCLINE <=1 SENSITIVE Sensitive     VANCOMYCIN 1 SENSITIVE Sensitive     TRIMETH/SULFA <=10 SENSITIVE Sensitive     CLINDAMYCIN >=8 RESISTANT Resistant     RIFAMPIN <=0.5 SENSITIVE Sensitive  Inducible Clindamycin NEGATIVE Sensitive     * 20,000 COLONIES/mL METHICILLIN RESISTANT STAPHYLOCOCCUS AUREUS  Culture, blood (routine x 2)     Status: None (Preliminary result)   Collection Time: 03/04/17  6:03 AM  Result Value Ref Range Status   Specimen Description   Final    BLOOD RIGHT ARM Performed at [facility], [address] [place], [place] [address]    Special Requests   Final    BOTTLES DRAWN AEROBIC AND ANAEROBIC Blood Culture adequate volume Performed at [facility], [address] [place], [place] [address]    Culture  Setup Time   Final    GRAM POSITIVE COCCI ANAEROBIC BOTTLE ONLY Gram Stain Report Called to,Read Back By and Verified With: MURPHY,N @0545  ON 3.1.19 BY BOWMAN,L Performed at [facility], [address] [place], [place] [address]    Culture [facility] POSITIVE COCCI  Final   Report Status PENDING  Incomplete  Culture, blood (routine x 2)     Status: Abnormal (Preliminary result)   Collection Time: 03/04/17  6:05 AM  Result Value Ref Range Status   Specimen Description   Final    BLOOD RIGHT FOREARM DRAWN BY RN Performed at [facility], [address], [place], [place] [address]    Special Requests   Final     BOTTLES DRAWN AEROBIC AND ANAEROBIC Blood Culture adequate volume Performed at [facility], [address] [place], [place] [address]    Culture  Setup Time   Final    GRAM POSITIVE COCCI IN BOTH AEROBIC AND ANAEROBIC BOTTLES Gram Stain Report Called to,Read Back By and Verified With: MURPHY,N @ 1962 ON 3.1.19 BY BOWMAN,L    Culture STAPHYLOCOCCUS SPECIES (COAGULASE NEGATIVE) (A)  Final   Report Status PENDING  Incomplete  Blood Culture ID Panel (Reflexed)     Status: Abnormal   Collection Time: 03/04/17  6:05 AM  Result Value Ref Range Status   Enterococcus species NOT DETECTED NOT DETECTED Final   Listeria monocytogenes NOT DETECTED NOT DETECTED Final   Staphylococcus species DETECTED (A) NOT DETECTED Final    Comment: Methicillin (oxacillin) resistant coagulase negative staphylococcus. Possible blood culture contaminant (unless isolated from more than one blood culture draw or clinical case suggests pathogenicity). No antibiotic treatment is indicated for blood  culture contaminants. CRITICAL RESULT CALLED TO, READ BACK BY AND VERIFIED WITH: M. Radford Pax Pharm.D. 9:40 03/05/17 ([email or web address])    Staphylococcus aureus NOT DETECTED NOT DETECTED Final   Methicillin resistance DETECTED (A) NOT DETECTED Final    Comment: CRITICAL RESULT CALLED TO, READ BACK BY AND VERIFIED WITH: M.  Radford Pax Pharm.D. 9:40 03/05/17 ([email or web address])    Streptococcus species NOT DETECTED NOT DETECTED Final   Streptococcus agalactiae NOT DETECTED NOT DETECTED Final   Streptococcus pneumoniae NOT DETECTED NOT DETECTED Final   Streptococcus pyogenes NOT DETECTED NOT DETECTED Final   Acinetobacter baumannii NOT DETECTED NOT DETECTED Final   Enterobacteriaceae species NOT DETECTED NOT DETECTED Final   Enterobacter cloacae complex NOT DETECTED NOT DETECTED Final   Escherichia coli NOT DETECTED NOT DETECTED Final   Klebsiella oxytoca NOT DETECTED NOT DETECTED Final   Klebsiella pneumoniae NOT DETECTED NOT DETECTED Final   Proteus  species NOT DETECTED NOT DETECTED Final   Serratia marcescens NOT DETECTED NOT DETECTED Final   Haemophilus influenzae NOT DETECTED NOT DETECTED Final   Neisseria meningitidis NOT DETECTED NOT DETECTED Final   Pseudomonas aeruginosa NOT DETECTED NOT DETECTED Final   Candida albicans NOT DETECTED NOT DETECTED Final  Candida glabrata NOT DETECTED NOT DETECTED Final   Candida krusei NOT DETECTED NOT DETECTED Final   Candida parapsilosis NOT DETECTED NOT DETECTED Final   Candida tropicalis NOT DETECTED NOT DETECTED Final    Comment: Performed at [facility], [place] [address], [place], [place] [address]  Aerobic Culture (superficial specimen)     Status: None (Preliminary result)   Collection Time: 03/04/17  8:48 AM  Result Value Ref Range Status   Specimen Description   Final    HIP LEFT Performed at [facility], [address], [place], [place] [address]    Special Requests   Final    Immunocompromised Performed at [facility], [address], [place], [place] [address]    Gram Stain   Final    RARE WBC PRESENT, PREDOMINANTLY PMN FEW GRAM POSITIVE COCCI IN PAIRS    Culture   Final    CULTURE REINCUBATED FOR BETTER GROWTH Performed at [facility], [place] [address] [place], [place] [address]    Report Status PENDING  Incomplete  Aerobic Culture (superficial specimen)     Status: None (Preliminary result)   Collection Time: 03/05/17 11:42 AM  Result Value Ref Range Status   Specimen Description WOUND LEFT HIP  Final   Special Requests PATIENT ON FOLLOWING ZOSYN GENTAMYCIN  Final   Gram Stain   Final    FEW WBC PRESENT,BOTH PMN AND MONONUCLEAR FEW GRAM POSITIVE COCCI IN CLUSTERS Performed at [facility], [place] [address], [place], [place] [address]    Culture PENDING  Incomplete   Report Status PENDING  Incomplete         Radiology Studies: Dg Chest 2 View  Result Date: 03/04/2017 CLINICAL DATA:  84 y/o  M; preop evaluation. EXAM: CHEST  2 VIEW  COMPARISON:  03/04/2017 chest radiograph FINDINGS: Stable cardiomegaly given projection and technique. Post median sternotomy with wires in alignment. Aortic atherosclerosis with calcification. Stable mild biapical pleuroparenchymal scarring. Pulmonary vascular congestion. No consolidation. No pleural effusion or pneumothorax. No acute osseous abnormality is evident. IMPRESSION: Stable cardiomegaly. Mild pulmonary vascular congestion. No consolidation. Aortic atherosclerosis. Electronically Signed   By: Kristine Garbe M.D.   On: 03/04/2017 23:55        Scheduled Meds: . apixaban  5 mg Oral BID  . docusate sodium  100 mg Oral BID  . finasteride  5 mg Oral Daily  . FLUoxetine  20 mg Oral Daily  . insulin aspart  0-9 Units Subcutaneous Q6H  . insulin glargine  10 Units Subcutaneous QHS  . metoprolol succinate  25 mg Oral Daily  . pantoprazole  40 mg Oral Daily  . sodium chloride flush  3 mL Intravenous Q12H  . tamsulosin  0.4 mg Oral Daily   Continuous Infusions: . sodium chloride 100 mL/hr at 03/04/17 2246  . lactated ringers 10 mL/hr at 03/05/17 0954  . vancomycin Stopped (03/06/17 0904)     LOS: 2 days    Time spent: 35 montes.     Elmarie Shiley, MD [facility] Pager [phone]  If 7PM-7AM, please contact night-coverage [email or web address] Password [email or web address] 03/06/2017, 9:30 AM

## 2017-03-06 NOTE — Anesthesia Postprocedure Evaluation (Signed)
Anesthesia Post Note  Patient: [name]  Procedure(s) Performed: LEFT HIP IRRIGATION AND DEBRIDEMENT AND PLACEMENT OF ANTIBIOTIC BEADS (Left Hip)     Patient location during evaluation: PACU Anesthesia Type: General Level of consciousness: awake (return to baseline: confused) Pain management: pain level controlled Vital Signs Assessment: post-procedure vital signs reviewed and stable Respiratory status: spontaneous breathing, nonlabored ventilation, respiratory function stable and patient connected to nasal cannula oxygen Cardiovascular status: blood pressure returned to baseline and stable Postop Assessment: no apparent nausea or vomiting Anesthetic complications: no    Last Vitals:  Vitals:   03/05/17 2210 03/06/17 0611  BP: (!) 153/71 (!) 150/53  Pulse: (!) 120 65  Resp: 20 14  Temp: 36.9 C 36.5 C  SpO2: 98% 96%    Last Pain:  Vitals:   03/06/17 0611  TempSrc: Oral  PainSc:                  Karyl Kinnier [place]ender

## 2017-03-06 NOTE — Plan of Care (Signed)
PT goals established for d/c to SNF

## 2017-03-06 NOTE — Evaluation (Signed)
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 Left hip hemiarthroplasty done 02/24/2017.  Admitted again after being found at home with left hip infection and confusion. PMHx:  a-fib, DM, CKD 3, BK infection, UTI, osteopenia, COPD, DVT, cellulitis, CAD, CABG  Clinical Impression  Patient is s/p above surgery presenting with functional limitations due to the deficits listed below (see PT Problem List). Able to tolerate bed mobility and scoot transfers along bed with moderate assistance. Unwilling to transfer out of bed this date due to anxiety and pain but did participate well with PT. Patient will benefit from skilled PT to increase their independence and safety with mobility to allow discharge to the venue listed below.       Follow Up Recommendations SNF    Equipment Recommendations  None recommended by PT    Recommendations for Other Services OT consult     Precautions / Restrictions Precautions Precautions: Posterior Hip Precaution Booklet Issued: Yes (comment) Restrictions Weight Bearing Restrictions: Yes LLE Weight Bearing: Weight bearing as tolerated      Mobility  Bed Mobility Overal bed mobility: Needs Assistance Bed Mobility: Sit to Supine;Supine to Sit     Supine to sit: Mod assist;HOB elevated Sit to supine: Mod assist;HOB elevated   General bed mobility comments: Requires verbal and tactile cues, extra time for one step tasks. Patient able to perform mobility with moderate assistance to move from supine to sitting at EOB and back. More assist for LLE than RLE.  Transfers Overall transfer level: Needs assistance   Transfers: Squat Pivot Transfers   Stand pivot transfers: Mod assist       General  transfer comment: Patient able to scoot along bed with mod assist to help slide buttocks along bed. Cues for RLE to assist and use of UEs. Not willing to transfer out of bed due to pain and anxiety today.  Ambulation/Gait                Stairs            Wheelchair Mobility    Modified [place] (Stroke Patients Only)       Balance Overall balance assessment: Needs assistance;History of Falls Sitting-balance support: Single extremity supported Sitting balance-Leahy Scale: Poor Sitting balance - Comments: Able to sit and reach in multiple directinos with forward and lateral leans and back to midline without assist. Holds one hand on bed at all times due to fear but feel pt could stabilize himself if willing.                                     Pertinent Vitals/Pain Pain Assessment: Faces Faces Pain Scale: Hurts whole lot Pain Location: L hip Pain Descriptors / Indicators: Operative site guarding Pain Intervention(s): Limited activity within patient's tolerance;Monitored during session;Repositioned    Home Living Family/patient expects to be discharged to:: Skilled nursing facility Living Arrangements: Alone Available Help at Discharge: Family;Available PRN/intermittently Type of Home: Mobile home Home Access: Stairs to enter Entrance Stairs-Rails: Can reach both Entrance Stairs-Number of Steps: 3 Home Layout: One level Home Equipment: None      Prior Function Level of Independence: Needs assistance   Gait / Transfers  Assistance Needed: unable to provide details at this time  ADL's / Homemaking Assistance Needed: Unable to provide details at this time.        Hand Dominance   Dominant Hand: Right    Extremity/Trunk Assessment   Upper Extremity Assessment Upper Extremity Assessment: Defer to OT evaluation    Lower Extremity Assessment Lower Extremity Assessment: Generalized weakness;LLE deficits/detail LLE Deficits / Details: L BK  amputation, wound vac in place at hip. LLE: Unable to fully assess due to pain       Communication   Communication: HOH  Cognition Arousal/Alertness: Awake/alert Behavior During Therapy: Flat affect Overall Cognitive Status: Impaired/Different from baseline Area of Impairment: Attention;Memory;Following commands;Safety/judgement;Awareness;Problem solving                   Current Attention Level: Selective Memory: Decreased short-term memory;Decreased recall of precautions Following Commands: Follows one step commands inconsistently;Follows one step commands with increased time Safety/Judgement: Decreased awareness of deficits;Decreased awareness of safety Awareness: Intellectual Problem Solving: Slow processing;Decreased initiation;Difficulty sequencing;Requires verbal cues;Requires tactile cues        General Comments General comments (skin integrity, edema, etc.): Anxious and tearful at times.    Exercises General Exercises - Lower Extremity Ankle Circles/Pumps: AROM;Right;10 reps;Seated Long Arc Quad: Strengthening;Both;10 reps;Seated   Assessment/Plan    PT Assessment Patient needs continued PT services  PT Problem List Decreased strength;Decreased range of motion;Decreased activity tolerance;Decreased balance;Decreased mobility;Decreased cognition;Decreased knowledge of use of DME;Decreased safety awareness;Decreased knowledge of precautions;Pain       PT Treatment Interventions DME instruction;Gait training;Functional mobility training;Therapeutic activities;Therapeutic exercise;Balance training;Neuromuscular re-education;Patient/family education;Wheelchair mobility training    PT Goals (Current goals can be found in the Care Plan section)  Acute Rehab PT Goals Patient Stated Goal: none stated PT Goal Formulation: Patient unable to participate in goal setting Time For Goal Achievement: 03/20/17 Potential to Achieve Goals: Fair    Frequency Min 2X/week    Barriers to discharge Inaccessible home environment;Decreased caregiver support Stairs and lack of support. Not safe to go home.    Co-evaluation               AM-PAC PT "6 Clicks" Daily Activity  Outcome Measure Difficulty turning over in bed (including adjusting bedclothes, sheets and blankets)?: A Lot Difficulty moving from lying on back to sitting on the side of the bed? : A Lot Difficulty sitting down on and standing up from a chair with arms (e.g., wheelchair, bedside commode, etc,.)?: Unable Help needed moving to and from a bed to chair (including a wheelchair)?: A Lot Help needed walking in hospital room?: Total Help needed climbing 3-5 steps with a railing? : Total 6 Click Score: 9    End of Session   Activity Tolerance: Other (comment);Patient tolerated treatment well(anxious and unwilling to transfer OOB today) Patient left: in bed;with call bell/phone within reach;with bed alarm set   PT Visit Diagnosis: Muscle weakness (generalized) (M62.81);History of falling (Z91.81);Difficulty in walking, not elsewhere classified (R26.2);Pain Pain - Right/Left: Left Pain - part of body: Hip    Time: 9371-6967 PT Time Calculation (min) (ACUTE ONLY): 27 min   Charges:   PT Evaluation $PT Eval High Complexity: 1 High PT Treatments $Therapeutic Activity: 8-22 mins   PT G Codes:        [employer], PT    Ellouise Newer 03/06/2017, 11:43 AM

## 2017-03-07 LAB — GLUCOSE, CAPILLARY
GLUCOSE-CAPILLARY: 166 mg/dL — AB (ref 65–99)
GLUCOSE-CAPILLARY: 186 mg/dL — AB (ref 65–99)
GLUCOSE-CAPILLARY: 157 mg/dL — AB (ref 65–99)
Glucose-Capillary: 151 mg/dL — ABNORMAL HIGH (ref 65–99)

## 2017-03-07 LAB — BASIC METABOLIC PANEL
ANION GAP: 9 (ref 5–15)
BUN: 14 mg/dL (ref 6–20)
CO2: 23 mmol/L (ref 22–32)
Calcium: 10.4 mg/dL — ABNORMAL HIGH (ref 8.9–10.3)
Chloride: 101 mmol/L (ref 101–111)
Creatinine, Ser: 0.98 mg/dL (ref 0.61–1.24)
GFR calc non Af Amer: >60 mL/min (ref >60)
Glucose, Bld: 161 mg/dL — ABNORMAL HIGH (ref 65–99)
POTASSIUM: 3.3 mmol/L — AB (ref 3.5–5.1)
SODIUM: 133 mmol/L — AB (ref 135–145)

## 2017-03-07 LAB — AEROBIC CULTURE W GRAM STAIN (SUPERFICIAL SPECIMEN)

## 2017-03-07 LAB — AEROBIC CULTURE  (SUPERFICIAL SPECIMEN)

## 2017-03-07 LAB — CBC WITH DIFFERENTIAL/PLATELET
BASOS ABS: 0 10*3/uL (ref 0.0–0.1)
BASOS PCT: 0 %
EOS ABS: 0.3 10*3/uL (ref 0.0–0.7)
EOS PCT: 2 %
HCT: 27.5 % — ABNORMAL LOW (ref 39.0–52.0)
Hemoglobin: 8.7 g/dL — ABNORMAL LOW (ref 13.0–17.0)
Lymphocytes Relative: 5 %
Lymphs Abs: 1.1 10*3/uL (ref 0.7–4.0)
MCH: 26.1 pg (ref 26.0–34.0)
MCHC: 31.6 g/dL (ref 30.0–36.0)
MCV: 82.6 fL (ref 78.0–100.0)
Monocytes Absolute: 0.8 10*3/uL (ref 0.1–1.0)
Monocytes Relative: 4 %
Neutro Abs: 18.2 10*3/uL — ABNORMAL HIGH (ref 1.7–7.7)
Neutrophils Relative %: 89 %
PLATELETS: 452 10*3/uL — AB (ref 150–400)
RBC: 3.33 MIL/uL — AB (ref 4.22–5.81)
RDW: 17.3 % — ABNORMAL HIGH (ref 11.5–15.5)
WBC: 20.4 10*3/uL — ABNORMAL HIGH (ref 4.0–10.5)

## 2017-03-07 LAB — CULTURE, BLOOD (ROUTINE X 2)
SPECIAL REQUESTS: ADEQUATE
Special Requests: ADEQUATE

## 2017-03-07 MED ORDER — POTASSIUM CHLORIDE CRYS ER 20 MEQ PO TBCR
40.0000 meq | EXTENDED_RELEASE_TABLET | Freq: Once | ORAL | Status: AC
  Administered 2017-03-07: 40 meq via ORAL
  Filled 2017-03-07: qty 2

## 2017-03-07 NOTE — Progress Notes (Signed)
PROGRESS NOTE    [name]  HYI:[id number] DOB: October 30, 1931 DOA: 03/04/2017 PCP: [name], MD    Brief Narrative: Phillip Duffner Chrismanis a 84 y.o.malefrom home where he was found in his bed with a lot of bloody drainage from around his surgical site where he had a left hip surgery done on February 20 by Dr. Lorin Mercy. Admitted for acute metabolic encephalopathy and sepsis 2/2 to surgical wound infection.  03/05/17. Orthopedic surgery consulted and following. POD #0 post I%D of left hip. Patient altered mental status is improving. He is alert and oriented x 2 and less agitated at the time of this visit. Admits to pain in his left hip 8/10. Has no other complaints.   Assessment & Plan:   Principal Problem:   Wound infection after surgery Active Problems:   Leukocytosis   Chronic diastolic CHF (congestive heart failure) (HCC)   Acute renal failure superimposed on stage 3 chronic kidney disease (HCC)   Atrial fibrillation with normal ventricular rate (HCC)   CAD (coronary artery disease)   Anemia in chronic kidney disease (CKD)   Sepsis (HCC)   Acute delirium   1-Wound infection post Hemiarthroplasty left hip;  Recent left hip surgery 02/24/17 Underwent I and D left hip and placement of prostalac absorbable vancomycin and gentamycin beads on 3-01 He will need long term antibiotics suppression.  ID following.  Continue with vancomycin.  Cultures from wound; Moderate staph aureus.  WBC trending down 34---24--20.   Staph Aureus Bacteremia; blood cultures 2-28 and staph not aureus in second set blood culture.  On IV antibiotics.  ID following.  Repeated Blood cultures 3-02 pending.   Sepsis; secondary to wound post hip replacement.  Vitals stable. On IV antibiotics. Vancomycin. Monitor for nephrotoxicity.   Dyspnea; stable. No hypoxemia.  chest x ray, which was negative.  Nebulizer PRN Incentive spirometry   Acute metabolic encephalopathy;  Related to infection. Appears  calm today,.  MS fluctuates. Confuse.   AKI on CKD stage III;  Baseline cr 1.1.  IV fluids.  Monitor renal function.   Mild hypercalcemia; repeat labs in am. On IV fluids.   Chronic A Fib;  Continue with metoprolol.  eliquis was started 3-02  Chronic anemia;  monitor hb.  Hb has been 9 range , trending down today. At 8.6 Monitor closely on eliquis.   DM;  lantus 10 units HS.  SSI.   Constipation; had multiples BM.   Depression/anxiety -prozac  BPH -finasteride, tamsulosin -monitor urine output  Hypokalemia; replete orally.   DVT prophylaxis: eliquis.  Code Status: partial  Family Communication: no family at bedside.  Disposition Plan: needs SNF Consultants:   Ortho   ID   Procedures:   Left Hip I and d   Antimicrobials:  vancomycin    Subjective: He is confuse. , asking for breakfast, related that he has been feed in days. That is not accurate.    Objective: Vitals:   03/06/17 1833 03/06/17 2106 03/07/17 0656 03/07/17 1000  BP: (!) 150/58 (!) 138/117 (!) 145/60 (!) 170/92  Pulse: 70 80 75 88  Resp: 14 17 18 18   Temp: 98.4 F (36.9 C) 98.3 F (36.8 C) 98 F (36.7 C) 97.7 F (36.5 C)  TempSrc: Oral Oral Oral Oral  SpO2: 98% 96% 97% 98%  Weight:      Height:        Intake/Output Summary (Last 24 hours) at 03/07/2017 1215 Last data filed at 03/07/2017 1131 Gross per 24 hour  Intake  3321.67 ml  Output 900 ml  Net 2421.67 ml   Filed Weights   03/05/17 0457  Weight: 74 kg (163 lb 2.2 oz)    Examination:  General exam: NAD  Respiratory system: Normal respiratory effort, CTA Cardiovascular system: S 1, S 2 RRR Gastrointestinal system: BS present, soft, nt Central nervous system: alert, confuse.  Extremities: Left hip with wound vac Skin: left hip with wound vac. No rashes.  Psychiatry: confuse.    Data Reviewed: I have personally reviewed following labs and imaging studies  CBC: Recent Labs  Lab 03/04/17 0602 03/06/17 0622  03/07/17 0522  WBC 34.0* 24.2* 20.4*  NEUTROABS 32.0* 21.3* 18.2*  HGB 9.5* 8.6* 8.7*  HCT 30.8* 27.7* 27.5*  MCV 81.9 81.7 82.6  PLT 407* 449* 272*   Basic Metabolic Panel: Recent Labs  Lab 03/04/17 0602 03/06/17 0622 03/07/17 0522  NA 131* 134* 133*  K 4.0 3.5 3.3*  CL 94* 100* 101  CO2 22 25 23   GLUCOSE 212* 182* 161*  BUN 33* 19 14  CREATININE 1.41* 1.06 0.98  CALCIUM 10.7* 10.6* 10.4*  MG  --  1.9  --    GFR: Estimated Creatinine Clearance: 53.4 mL/min (by C-G formula based on SCr of 0.98 mg/dL). Liver Function Tests: Recent Labs  Lab 03/04/17 0602 03/06/17 0622  AST 20 13*  ALT 14* 10*  ALKPHOS 155* 188*  BILITOT 1.7* 1.2  PROT 6.6 5.3*  ALBUMIN 2.6* 2.0*   No results for input(s): LIPASE, AMYLASE in the last 168 hours. No results for input(s): AMMONIA in the last 168 hours. Coagulation Profile: No results for input(s): INR, PROTIME in the last 168 hours. Cardiac Enzymes: No results for input(s): CKTOTAL, CKMB, CKMBINDEX, TROPONINI in the last 168 hours. BNP (last 3 results) No results for input(s): PROBNP in the last 8760 hours. HbA1C: No results for input(s): HGBA1C in the last 72 hours. CBG: Recent Labs  Lab 03/06/17 0720 03/06/17 1138 03/06/17 1747 03/06/17 2134 03/07/17 0612  GLUCAP 169* 201* 197* 156* 151*   Lipid Profile: No results for input(s): CHOL, HDL, LDLCALC, TRIG, CHOLHDL, LDLDIRECT in the last 72 hours. Thyroid Function Tests: No results for input(s): TSH, T4TOTAL, FREET4, T3FREE, THYROIDAB in the last 72 hours. Anemia Panel: No results for input(s): VITAMINB12, FOLATE, FERRITIN, TIBC, IRON, RETICCTPCT in the last 72 hours. Sepsis Labs: Recent Labs  Lab 03/04/17 5366 03/04/17 0840  LATICACIDVEN 2.43* 1.0    Recent Results (from the past 240 hour(s))  Urine culture     Status: Abnormal   Collection Time: 03/04/17  5:44 AM  Result Value Ref Range Status   Specimen Description   Final    URINE, CATHETERIZED Performed at  [facility], [address], [place], [place] [address]    Special Requests   Final    NONE Performed at [facility], [address], [place], [place] [address]    Culture (A)  Final    20,000 COLONIES/mL METHICILLIN RESISTANT STAPHYLOCOCCUS AUREUS   Report Status 03/06/2017 FINAL  Final   Organism ID, Bacteria METHICILLIN RESISTANT STAPHYLOCOCCUS AUREUS (A)  Final      Susceptibility   Methicillin resistant staphylococcus aureus - MIC*    CIPROFLOXACIN >=8 RESISTANT Resistant     GENTAMICIN >=16 RESISTANT Resistant     NITROFURANTOIN <=16 SENSITIVE Sensitive     OXACILLIN >=4 RESISTANT Resistant     TETRACYCLINE <=1 SENSITIVE Sensitive     VANCOMYCIN 1 SENSITIVE Sensitive     TRIMETH/SULFA <=10 SENSITIVE Sensitive  CLINDAMYCIN >=8 RESISTANT Resistant     RIFAMPIN <=0.5 SENSITIVE Sensitive     Inducible Clindamycin NEGATIVE Sensitive     * 20,000 COLONIES/mL METHICILLIN RESISTANT STAPHYLOCOCCUS AUREUS  Culture, blood (routine x 2)     Status: Abnormal   Collection Time: 03/04/17  6:03 AM  Result Value Ref Range Status   Specimen Description   Final    BLOOD RIGHT ARM Performed at [facility], [address] [place], [place] [address]    Special Requests   Final    BOTTLES DRAWN AEROBIC AND ANAEROBIC Blood Culture adequate volume Performed at [facility], [address], [place], [place] [address]    Culture  Setup Time   Final    GRAM POSITIVE COCCI ANAEROBIC BOTTLE ONLY Gram Stain Report Called to,Read Back By and Verified With: MURPHY,N @0545  ON 3.1.19 BY BOWMAN,L Performed at [facility], [address], [place], [place] [address]    Culture METHICILLIN RESISTANT STAPHYLOCOCCUS AUREUS (A)  Final   Report Status 03/07/2017 FINAL  Final   Organism ID, Bacteria METHICILLIN RESISTANT STAPHYLOCOCCUS AUREUS  Final      Susceptibility   Methicillin resistant staphylococcus aureus - MIC*    CIPROFLOXACIN >=8 RESISTANT Resistant     ERYTHROMYCIN >=8 RESISTANT Resistant      GENTAMICIN >=16 RESISTANT Resistant     OXACILLIN >=4 RESISTANT Resistant     TETRACYCLINE <=1 SENSITIVE Sensitive     VANCOMYCIN <=0.5 SENSITIVE Sensitive     TRIMETH/SULFA <=10 SENSITIVE Sensitive     CLINDAMYCIN >=8 RESISTANT Resistant     RIFAMPIN <=0.5 SENSITIVE Sensitive     Inducible Clindamycin NEGATIVE Sensitive     * METHICILLIN RESISTANT STAPHYLOCOCCUS AUREUS  Culture, blood (routine x 2)     Status: Abnormal   Collection Time: 03/04/17  6:05 AM  Result Value Ref Range Status   Specimen Description   Final    BLOOD RIGHT FOREARM DRAWN BY RN Performed at [facility], [address], [place], [place] [address]    Special Requests   Final    BOTTLES DRAWN AEROBIC AND ANAEROBIC Blood Culture adequate volume Performed at [facility], [address], [place], [place] [address]    Culture  Setup Time   Final    GRAM POSITIVE COCCI IN BOTH AEROBIC AND ANAEROBIC BOTTLES Gram Stain Report Called to,Read Back By and Verified With: MURPHY,N @ 1950 ON 3.1.19 BY BOWMAN,L    Culture (A)  Final    STAPHYLOCOCCUS SPECIES (COAGULASE NEGATIVE) THE SIGNIFICANCE OF ISOLATING THIS ORGANISM FROM A SINGLE SET OF BLOOD CULTURES WHEN MULTIPLE SETS ARE DRAWN IS UNCERTAIN. PLEASE NOTIFY THE MICROBIOLOGY DEPARTMENT WITHIN ONE WEEK IF SPECIATION AND SENSITIVITIES ARE REQUIRED. Performed at [facility], [place] [address] [place], [place] [address]    Report Status 03/07/2017 FINAL  Final  Blood Culture ID Panel (Reflexed)     Status: Abnormal   Collection Time: 03/04/17  6:05 AM  Result Value Ref Range Status   Enterococcus species NOT DETECTED NOT DETECTED Final   Listeria monocytogenes NOT DETECTED NOT DETECTED Final   Staphylococcus species DETECTED (A) NOT DETECTED Final    Comment: Methicillin (oxacillin) resistant coagulase negative staphylococcus. Possible blood culture contaminant (unless isolated from more than one blood culture draw or clinical case suggests pathogenicity). No  antibiotic treatment is indicated for blood  culture contaminants. CRITICAL RESULT CALLED TO, READ BACK BY AND VERIFIED WITH: M. Radford Pax Pharm.D. 9:40 03/05/17 ([email or web address])    Staphylococcus aureus NOT DETECTED NOT  DETECTED Final   Methicillin resistance DETECTED (A) NOT DETECTED Final    Comment: CRITICAL RESULT CALLED TO, READ BACK BY AND VERIFIED WITH: M.  Radford Pax Pharm.D. 9:40 03/05/17 ([email or web address])    Streptococcus species NOT DETECTED NOT DETECTED Final   Streptococcus agalactiae NOT DETECTED NOT DETECTED Final   Streptococcus pneumoniae NOT DETECTED NOT DETECTED Final   Streptococcus pyogenes NOT DETECTED NOT DETECTED Final   Acinetobacter baumannii NOT DETECTED NOT DETECTED Final   Enterobacteriaceae species NOT DETECTED NOT DETECTED Final   Enterobacter cloacae complex NOT DETECTED NOT DETECTED Final   Escherichia coli NOT DETECTED NOT DETECTED Final   Klebsiella oxytoca NOT DETECTED NOT DETECTED Final   Klebsiella pneumoniae NOT DETECTED NOT DETECTED Final   Proteus species NOT DETECTED NOT DETECTED Final   Serratia marcescens NOT DETECTED NOT DETECTED Final   Haemophilus influenzae NOT DETECTED NOT DETECTED Final   Neisseria meningitidis NOT DETECTED NOT DETECTED Final   Pseudomonas aeruginosa NOT DETECTED NOT DETECTED Final   Candida albicans NOT DETECTED NOT DETECTED Final   Candida glabrata NOT DETECTED NOT DETECTED Final   Candida krusei NOT DETECTED NOT DETECTED Final   Candida parapsilosis NOT DETECTED NOT DETECTED Final   Candida tropicalis NOT DETECTED NOT DETECTED Final    Comment: Performed at [facility], [place] [address], [place], [place] [address]  Aerobic Culture (superficial specimen)     Status: None (Preliminary result)   Collection Time: 03/04/17  8:48 AM  Result Value Ref Range Status   Specimen Description   Final    HIP LEFT Performed at [facility], [address] [place], [place] [address]    Special Requests   Final    Immunocompromised Performed  at [facility], [address] [place], [place] [address]    Gram Stain   Final    RARE WBC PRESENT, PREDOMINANTLY PMN FEW GRAM POSITIVE COCCI IN PAIRS Performed at [facility], [place] [address] [place], [place] [address]    Culture   Final    ABUNDANT METHICILLIN RESISTANT STAPHYLOCOCCUS AUREUS   Report Status PENDING  Incomplete   Organism ID, Bacteria METHICILLIN RESISTANT STAPHYLOCOCCUS AUREUS  Final      Susceptibility   Methicillin resistant staphylococcus aureus - MIC*    CIPROFLOXACIN >=8 RESISTANT Resistant     ERYTHROMYCIN >=8 RESISTANT Resistant     GENTAMICIN >=16 RESISTANT Resistant     OXACILLIN >=4 RESISTANT Resistant     TETRACYCLINE <=1 SENSITIVE Sensitive     VANCOMYCIN <=0.5 SENSITIVE Sensitive     TRIMETH/SULFA <=10 SENSITIVE Sensitive     CLINDAMYCIN >=8 RESISTANT Resistant     RIFAMPIN <=0.5 SENSITIVE Sensitive     Inducible Clindamycin NEGATIVE Sensitive     * ABUNDANT METHICILLIN RESISTANT STAPHYLOCOCCUS AUREUS  Aerobic Culture (superficial specimen)     Status: None   Collection Time: 03/05/17 11:42 AM  Result Value Ref Range Status   Specimen Description WOUND LEFT HIP  Final   Special Requests PATIENT ON FOLLOWING ZOSYN GENTAMYCIN  Final   Gram Stain   Final    FEW WBC PRESENT,BOTH PMN AND MONONUCLEAR FEW GRAM POSITIVE COCCI IN CLUSTERS Performed at [facility], [address], [place], [place] [address]    Culture   Final    MODERATE METHICILLIN RESISTANT STAPHYLOCOCCUS AUREUS   Report Status 03/07/2017 FINAL  Final   Organism ID, Bacteria METHICILLIN RESISTANT STAPHYLOCOCCUS AUREUS  Final      Susceptibility   Methicillin resistant staphylococcus  aureus - MIC*    CIPROFLOXACIN >=8 RESISTANT Resistant     ERYTHROMYCIN >=8 RESISTANT Resistant     GENTAMICIN <=0.5 SENSITIVE Sensitive     OXACILLIN >=4 RESISTANT Resistant     TETRACYCLINE <=1 SENSITIVE Sensitive     VANCOMYCIN 1 SENSITIVE Sensitive     TRIMETH/SULFA <=10 SENSITIVE  Sensitive     CLINDAMYCIN >=8 RESISTANT Resistant     RIFAMPIN <=0.5 SENSITIVE Sensitive     Inducible Clindamycin NEGATIVE Sensitive     * MODERATE METHICILLIN RESISTANT STAPHYLOCOCCUS AUREUS         Radiology Studies: Dg Chest Port 1 View  Result Date: 03/06/2017 CLINICAL DATA:  84 year old male with shortness of breath and left chest pain for 1 hour. EXAM: PORTABLE CHEST 1 VIEW COMPARISON:  03/04/2017 and earlier. FINDINGS: Portable AP semi upright view at 1059 hours. Prior CABG. Stable cardiac size and mediastinal contours. Visualized tracheal air column is within normal limits. Mildly lower lung volumes. Allowing for portable technique the lungs are clear. No acute osseous abnormality identified. IMPRESSION: No acute cardiopulmonary abnormality. Electronically Signed   By: Genevie Ann M.D.   On: 03/06/2017 12:27        Scheduled Meds: . apixaban  5 mg Oral BID  . docusate sodium  100 mg Oral BID  . feeding supplement (ENSURE ENLIVE)  237 mL Oral BID BM  . finasteride  5 mg Oral Daily  . FLUoxetine  20 mg Oral Daily  . insulin aspart  0-9 Units Subcutaneous Q6H  . insulin glargine  10 Units Subcutaneous QHS  . metoprolol succinate  25 mg Oral Daily  . pantoprazole  40 mg Oral Daily  . potassium chloride  40 mEq Oral Once  . senna-docusate  1 tablet Oral BID  . sodium chloride flush  3 mL Intravenous Q12H  . tamsulosin  0.4 mg Oral Daily   Continuous Infusions: . sodium chloride 100 mL/hr at 03/07/17 1039  . lactated ringers 10 mL/hr at 03/05/17 0954  . vancomycin Stopped (03/07/17 0805)     LOS: 3 days    Time spent: 35 montes.     Elmarie Shiley, MD [facility] Pager [phone]  If 7PM-7AM, please contact night-coverage [email or web address] Password [facility] 03/07/2017, 12:15 PM

## 2017-03-08 ENCOUNTER — Encounter (HOSPITAL_COMMUNITY): Payer: Self-pay | Admitting: Orthopaedic Surgery

## 2017-03-08 ENCOUNTER — Other Ambulatory Visit (HOSPITAL_COMMUNITY): Payer: Medicare Other

## 2017-03-08 DIAGNOSIS — D72829 Elevated white blood cell count, unspecified: Secondary | ICD-10-CM

## 2017-03-08 DIAGNOSIS — R7881 Bacteremia: Secondary | ICD-10-CM

## 2017-03-08 LAB — BASIC METABOLIC PANEL
Anion gap: 9 (ref 5–15)
BUN: 11 mg/dL (ref 6–20)
CALCIUM: 10.6 mg/dL — AB (ref 8.9–10.3)
CO2: 24 mmol/L (ref 22–32)
CREATININE: 0.87 mg/dL (ref 0.61–1.24)
Chloride: 101 mmol/L (ref 101–111)
GFR calc Af Amer: >60 mL/min (ref >60)
Glucose, Bld: 144 mg/dL — ABNORMAL HIGH (ref 65–99)
Potassium: 3.5 mmol/L (ref 3.5–5.1)
SODIUM: 134 mmol/L — AB (ref 135–145)

## 2017-03-08 LAB — CBC
HEMATOCRIT: 27.4 % — AB (ref 39.0–52.0)
HEMOGLOBIN: 8.4 g/dL — AB (ref 13.0–17.0)
MCH: 25 pg — ABNORMAL LOW (ref 26.0–34.0)
MCHC: 30.7 g/dL (ref 30.0–36.0)
MCV: 81.5 fL (ref 78.0–100.0)
Platelets: 498 10*3/uL — ABNORMAL HIGH (ref 150–400)
RBC: 3.36 MIL/uL — AB (ref 4.22–5.81)
RDW: 16.7 % — ABNORMAL HIGH (ref 11.5–15.5)
WBC: 19.2 10*3/uL — AB (ref 4.0–10.5)

## 2017-03-08 LAB — GLUCOSE, CAPILLARY
GLUCOSE-CAPILLARY: 169 mg/dL — AB (ref 65–99)
GLUCOSE-CAPILLARY: 111 mg/dL — AB (ref 65–99)
Glucose-Capillary: 160 mg/dL — ABNORMAL HIGH (ref 65–99)
Glucose-Capillary: 157 mg/dL — ABNORMAL HIGH (ref 65–99)
Glucose-Capillary: 125 mg/dL — ABNORMAL HIGH (ref 65–99)

## 2017-03-08 MED ORDER — VANCOMYCIN HCL IN DEXTROSE 750-5 MG/150ML-% IV SOLN
750.0000 mg | Freq: Two times a day (BID) | INTRAVENOUS | Status: DC
  Administered 2017-03-08 – 2017-03-09 (×3): 750 mg via INTRAVENOUS
  Filled 2017-03-08 (×4): qty 150

## 2017-03-08 NOTE — Progress Notes (Signed)
PROGRESS NOTE    [name]  KKX:[id number] DOB: 09/09/1931 DOA: 03/04/2017 PCP: [name], MD    Brief Narrative: Phillip Detweiler Chrismanis a 84 y.o.malefrom home where he was found in his bed with a lot of bloody drainage from around his surgical site where he had a left hip surgery done on February 20 by Dr. Lorin Mercy. Admitted for acute metabolic encephalopathy and sepsis 2/2 to surgical wound infection.  03/05/17. Orthopedic surgery consulted and following. POD #0 post I%D of left hip. Patient altered mental status is improving. He is alert and oriented x 2 and less agitated at the time of this visit. Admits to pain in his left hip 8/10. Has no other complaints.   Assessment & Plan:   Principal Problem:   Wound infection after surgery Active Problems:   Leukocytosis   Chronic diastolic CHF (congestive heart failure) (HCC)   Acute renal failure superimposed on stage 3 chronic kidney disease (HCC)   Atrial fibrillation with normal ventricular rate (HCC)   CAD (coronary artery disease)   Anemia in chronic kidney disease (CKD)   Sepsis ([place])   Acute delirium   1-Hemiarthroplasty left hip; infection. MRSA infection.  Recent left hip surgery 02/24/17 Underwent I and D left hip and placement of prostalac absorbable vancomycin and gentamycin beads on 3-01 He will need long term antibiotics suppression.  ID following.  Continue with vancomycin. Needs 6 weeks of IV antibiotics.  Cultures from wound; Moderate staph aureus.  WBC trending down 34---24--20. WBC pending for today.  Wound vac with blood around , nurse will contact ortho.   MRSA Bacteremia; Staph Aureus Bacteremia; blood cultures 2-28 and staph not aureus in second set blood culture.  On IV antibiotics.  ID following.  Repeated Blood cultures 3-02 no growth in 2 days. Picc line placement on 3-05 ECHO ordered.  Needs 6 weeks of IV antibiotics, follow by 3-6 months of oral therapy (Bactrim or Doxy, and likely long term  suppression after,.   Sepsis; secondary to wound post hip replacement.  Vitals stable. On IV antibiotics. Vancomycin. Monitor for nephrotoxicity.   Dyspnea; stable. No hypoxemia.  chest x ray, which was negative.  Nebulizer PRN Incentive spirometry   Acute metabolic encephalopathy;  Related to infection. Appears calm today,.  MS fluctuates. Confuse.   AKI on CKD stage III;  Baseline cr 1.1.  IV fluids.  Monitor renal function.   Mild hypercalcemia; repeat labs in am. On IV fluids.   Chronic A Fib;  Continue with metoprolol.  eliquis was started 3-02  Chronic anemia;  monitor hb.  Hb has been 9 range , trending down today. At 8.6 Monitor closely on eliquis.  Awaiting hb level.   DM;  lantus 10 units HS.  SSI.   Constipation; had multiples BM.   Depression/anxiety -prozac  BPH -finasteride, tamsulosin -monitor urine output  Hypokalemia; replaced.   DVT prophylaxis: eliquis.  Code Status: partial  Family Communication: no family at bedside.  Disposition Plan: needs SNF Consultants:   Ortho   ID   Procedures:   Left Hip I and d   Antimicrobials:  vancomycin    Subjective: Having pain left hip. denies dyspnea.   Objective: Vitals:   03/07/17 2005 03/07/17 2100 03/08/17 0600 03/08/17 0757  BP: (!) 159/74  (!) 167/75 (!) 158/72  Pulse: 85  79 80  Resp: 20  18 17   Temp: 98.5 F (36.9 C)  98.2 F (36.8 C) 98.4 F (36.9 C)  TempSrc: Oral  Oral  Oral  SpO2: 98%  98% 99%  Weight:  77.7 kg (171 lb 4.8 oz)    Height:        Intake/Output Summary (Last 24 hours) at 03/08/2017 1423 Last data filed at 03/08/2017 1229 Gross per 24 hour  Intake 2133.33 ml  Output 3050 ml  Net -916.67 ml   Filed Weights   03/05/17 0457 03/07/17 2100  Weight: 74 kg (163 lb 2.2 oz) 77.7 kg (171 lb 4.8 oz)    Examination:  General exam; NAD Respiratory system: Normal respiratory effort. Crackles bases.  Cardiovascular system: S 1, S 2 RRR Gastrointestinal  system: BS present, soft, nt Central nervous system: Alert , confuse.  Extremities: Left hip with wound vac, dressing bloody  Skin: no rashes.  Psychiatry: confuse.    Data Reviewed: I have personally reviewed following labs and imaging studies  CBC: Recent Labs  Lab 03/04/17 0602 03/06/17 0622 03/07/17 0522  WBC 34.0* 24.2* 20.4*  NEUTROABS 32.0* 21.3* 18.2*  HGB 9.5* 8.6* 8.7*  HCT 30.8* 27.7* 27.5*  MCV 81.9 81.7 82.6  PLT 407* 449* 638*   Basic Metabolic Panel: Recent Labs  Lab 03/04/17 0602 03/06/17 0622 03/07/17 0522 03/08/17 0402  NA 131* 134* 133* 134*  K 4.0 3.5 3.3* 3.5  CL 94* 100* 101 101  CO2 22 25 23 24   GLUCOSE 212* 182* 161* 144*  BUN 33* 19 14 11   CREATININE 1.41* 1.06 0.98 0.87  CALCIUM 10.7* 10.6* 10.4* 10.6*  MG  --  1.9  --   --    GFR: Estimated Creatinine Clearance: 60.1 mL/min (by C-G formula based on SCr of 0.87 mg/dL). Liver Function Tests: Recent Labs  Lab 03/04/17 0602 03/06/17 0622  AST 20 13*  ALT 14* 10*  ALKPHOS 155* 188*  BILITOT 1.7* 1.2  PROT 6.6 5.3*  ALBUMIN 2.6* 2.0*   No results for input(s): LIPASE, AMYLASE in the last 168 hours. No results for input(s): AMMONIA in the last 168 hours. Coagulation Profile: No results for input(s): INR, PROTIME in the last 168 hours. Cardiac Enzymes: No results for input(s): CKTOTAL, CKMB, CKMBINDEX, TROPONINI in the last 168 hours. BNP (last 3 results) No results for input(s): PROBNP in the last 8760 hours. HbA1C: No results for input(s): HGBA1C in the last 72 hours. CBG: Recent Labs  Lab 03/07/17 1218 03/07/17 1712 03/07/17 2324 03/08/17 0622 03/08/17 1151  GLUCAP 166* 186* 157* 169* 125*   Lipid Profile: No results for input(s): CHOL, HDL, LDLCALC, TRIG, CHOLHDL, LDLDIRECT in the last 72 hours. Thyroid Function Tests: No results for input(s): TSH, T4TOTAL, FREET4, T3FREE, THYROIDAB in the last 72 hours. Anemia Panel: No results for input(s): VITAMINB12, FOLATE,  FERRITIN, TIBC, IRON, RETICCTPCT in the last 72 hours. Sepsis Labs: Recent Labs  Lab 03/04/17 4665 03/04/17 0840  LATICACIDVEN 2.43* 1.0    Recent Results (from the past 240 hour(s))  Urine culture     Status: Abnormal   Collection Time: 03/04/17  5:44 AM  Result Value Ref Range Status   Specimen Description   Final    URINE, CATHETERIZED Performed at [facility], [address] [place], [place] [address]    Special Requests   Final    NONE Performed at [facility], [address], [place], [place] [address]    Culture (A)  Final    20,000 COLONIES/mL METHICILLIN RESISTANT STAPHYLOCOCCUS AUREUS   Report Status 03/06/2017 FINAL  Final   Organism ID, Bacteria METHICILLIN RESISTANT STAPHYLOCOCCUS AUREUS (A)  Final  Susceptibility   Methicillin resistant staphylococcus aureus - MIC*    CIPROFLOXACIN >=8 RESISTANT Resistant     GENTAMICIN >=16 RESISTANT Resistant     NITROFURANTOIN <=16 SENSITIVE Sensitive     OXACILLIN >=4 RESISTANT Resistant     TETRACYCLINE <=1 SENSITIVE Sensitive     VANCOMYCIN 1 SENSITIVE Sensitive     TRIMETH/SULFA <=10 SENSITIVE Sensitive     CLINDAMYCIN >=8 RESISTANT Resistant     RIFAMPIN <=0.5 SENSITIVE Sensitive     Inducible Clindamycin NEGATIVE Sensitive     * 20,000 COLONIES/mL METHICILLIN RESISTANT STAPHYLOCOCCUS AUREUS  Culture, blood (routine x 2)     Status: Abnormal   Collection Time: 03/04/17  6:03 AM  Result Value Ref Range Status   Specimen Description   Final    BLOOD RIGHT ARM Performed at [facility], [address], [place], [place] [address]    Special Requests   Final    BOTTLES DRAWN AEROBIC AND ANAEROBIC Blood Culture adequate volume Performed at [facility], [address] [place], [place] [address]    Culture  Setup Time   Final    GRAM POSITIVE COCCI ANAEROBIC BOTTLE ONLY Gram Stain Report Called to,Read Back By and Verified With: [email or web address],A @0545  ON 3.1.19 BY BOWMAN,L Performed at [facility], [address],  [place], [place] [address]    Culture METHICILLIN RESISTANT STAPHYLOCOCCUS AUREUS (A)  Final   Report Status 03/07/2017 FINAL  Final   Organism ID, Bacteria METHICILLIN RESISTANT STAPHYLOCOCCUS AUREUS  Final      Susceptibility   Methicillin resistant staphylococcus aureus - MIC*    CIPROFLOXACIN >=8 RESISTANT Resistant     ERYTHROMYCIN >=8 RESISTANT Resistant     GENTAMICIN >=16 RESISTANT Resistant     OXACILLIN >=4 RESISTANT Resistant     TETRACYCLINE <=1 SENSITIVE Sensitive     VANCOMYCIN <=0.5 SENSITIVE Sensitive     TRIMETH/SULFA <=10 SENSITIVE Sensitive     CLINDAMYCIN >=8 RESISTANT Resistant     RIFAMPIN <=0.5 SENSITIVE Sensitive     Inducible Clindamycin NEGATIVE Sensitive     * METHICILLIN RESISTANT STAPHYLOCOCCUS AUREUS  Culture, blood (routine x 2)     Status: Abnormal   Collection Time: 03/04/17  6:05 AM  Result Value Ref Range Status   Specimen Description   Final    BLOOD RIGHT FOREARM DRAWN BY RN Performed at [facility], [address], [place], [place] [address]    Special Requests   Final    BOTTLES DRAWN AEROBIC AND ANAEROBIC Blood Culture adequate volume Performed at [facility], [address] [place], [place] [address]    Culture  Setup Time   Final    GRAM POSITIVE COCCI IN BOTH AEROBIC AND ANAEROBIC BOTTLES Gram Stain Report Called to,Read Back By and Verified With: MURPHY,N @ 9024 ON 3.1.19 BY BOWMAN,L    Culture (A)  Final    STAPHYLOCOCCUS SPECIES (COAGULASE NEGATIVE) THE SIGNIFICANCE OF ISOLATING THIS ORGANISM FROM A SINGLE SET OF BLOOD CULTURES WHEN MULTIPLE SETS ARE DRAWN IS UNCERTAIN. PLEASE NOTIFY THE MICROBIOLOGY DEPARTMENT WITHIN ONE WEEK IF SPECIATION AND SENSITIVITIES ARE REQUIRED. Performed at [facility], [place] [address], [place], [place] [address]    Report Status 03/07/2017 FINAL  Final  Blood Culture ID Panel (Reflexed)     Status: Abnormal   Collection Time: 03/04/17  6:05 AM  Result Value Ref Range Status   Enterococcus species NOT  DETECTED NOT DETECTED Final   Listeria monocytogenes NOT DETECTED NOT DETECTED Final   Staphylococcus  species DETECTED (A) NOT DETECTED Final    Comment: Methicillin (oxacillin) resistant coagulase negative staphylococcus. Possible blood culture contaminant (unless isolated from more than one blood culture draw or clinical case suggests pathogenicity). No antibiotic treatment is indicated for blood  culture contaminants. CRITICAL RESULT CALLED TO, READ BACK BY AND VERIFIED WITH: M. Radford Pax Pharm.D. 9:40 03/05/17 ([email or web address])    Staphylococcus aureus NOT DETECTED NOT DETECTED Final   Methicillin resistance DETECTED (A) NOT DETECTED Final    Comment: CRITICAL RESULT CALLED TO, READ BACK BY AND VERIFIED WITH: M.  Radford Pax Pharm.D. 9:40 03/05/17 ([email or web address])    Streptococcus species NOT DETECTED NOT DETECTED Final   Streptococcus agalactiae NOT DETECTED NOT DETECTED Final   Streptococcus pneumoniae NOT DETECTED NOT DETECTED Final   Streptococcus pyogenes NOT DETECTED NOT DETECTED Final   Acinetobacter baumannii NOT DETECTED NOT DETECTED Final   Enterobacteriaceae species NOT DETECTED NOT DETECTED Final   Enterobacter cloacae complex NOT DETECTED NOT DETECTED Final   Escherichia coli NOT DETECTED NOT DETECTED Final   Klebsiella oxytoca NOT DETECTED NOT DETECTED Final   Klebsiella pneumoniae NOT DETECTED NOT DETECTED Final   Proteus species NOT DETECTED NOT DETECTED Final   Serratia marcescens NOT DETECTED NOT DETECTED Final   Haemophilus influenzae NOT DETECTED NOT DETECTED Final   Neisseria meningitidis NOT DETECTED NOT DETECTED Final   Pseudomonas aeruginosa NOT DETECTED NOT DETECTED Final   Candida albicans NOT DETECTED NOT DETECTED Final   Candida glabrata NOT DETECTED NOT DETECTED Final   Candida krusei NOT DETECTED NOT DETECTED Final   Candida parapsilosis NOT DETECTED NOT DETECTED Final   Candida tropicalis NOT DETECTED NOT DETECTED Final    Comment: Performed at [facility], [place] [address], [place], [place] [address]  Aerobic Culture (superficial specimen)     Status: None   Collection Time: 03/04/17  8:48 AM  Result Value Ref Range Status   Specimen Description   Final    HIP LEFT Performed at [facility], [address] [place], [place] [address]    Special Requests   Final    Immunocompromised Performed at [facility], [address], [place], [place] [address]    Gram Stain   Final    RARE WBC PRESENT, PREDOMINANTLY PMN FEW GRAM POSITIVE COCCI IN PAIRS    Culture   Final    ABUNDANT METHICILLIN RESISTANT STAPHYLOCOCCUS AUREUS ABUNDANT DIPHTHEROIDS(CORYNEBACTERIUM SPECIES) Standardized susceptibility testing for this organism is not available. Performed at [facility], [place] [address] [place], [place] [address]    Report Status 03/07/2017 FINAL  Final   Organism ID, Bacteria METHICILLIN RESISTANT STAPHYLOCOCCUS AUREUS  Final      Susceptibility   Methicillin resistant staphylococcus aureus - MIC*    CIPROFLOXACIN >=8 RESISTANT Resistant     ERYTHROMYCIN >=8 RESISTANT Resistant     GENTAMICIN >=16 RESISTANT Resistant     OXACILLIN >=4 RESISTANT Resistant     TETRACYCLINE <=1 SENSITIVE Sensitive     VANCOMYCIN <=0.5 SENSITIVE Sensitive     TRIMETH/SULFA <=10 SENSITIVE Sensitive     CLINDAMYCIN >=8 RESISTANT Resistant     RIFAMPIN <=0.5 SENSITIVE Sensitive     Inducible Clindamycin NEGATIVE Sensitive     * ABUNDANT METHICILLIN RESISTANT STAPHYLOCOCCUS AUREUS  Aerobic Culture (superficial specimen)     Status: None   Collection Time: 03/05/17 11:42 AM  Result Value Ref Range Status   Specimen Description WOUND LEFT HIP  Final   Special Requests PATIENT ON FOLLOWING ZOSYN GENTAMYCIN  Final  Gram Stain   Final    FEW WBC PRESENT,BOTH PMN AND MONONUCLEAR FEW GRAM POSITIVE COCCI IN CLUSTERS Performed at [facility], [place] [address], [place], [place] [address]    Culture   Final    MODERATE METHICILLIN RESISTANT STAPHYLOCOCCUS AUREUS    Report Status 03/07/2017 FINAL  Final   Organism ID, Bacteria METHICILLIN RESISTANT STAPHYLOCOCCUS AUREUS  Final      Susceptibility   Methicillin resistant staphylococcus aureus - MIC*    CIPROFLOXACIN >=8 RESISTANT Resistant     ERYTHROMYCIN >=8 RESISTANT Resistant     GENTAMICIN <=0.5 SENSITIVE Sensitive     OXACILLIN >=4 RESISTANT Resistant     TETRACYCLINE <=1 SENSITIVE Sensitive     VANCOMYCIN 1 SENSITIVE Sensitive     TRIMETH/SULFA <=10 SENSITIVE Sensitive     CLINDAMYCIN >=8 RESISTANT Resistant     RIFAMPIN <=0.5 SENSITIVE Sensitive     Inducible Clindamycin NEGATIVE Sensitive     * MODERATE METHICILLIN RESISTANT STAPHYLOCOCCUS AUREUS  Culture, blood (routine x 2)     Status: None (Preliminary result)   Collection Time: 03/06/17  6:32 AM  Result Value Ref Range Status   Specimen Description BLOOD LEFT ANTECUBITAL  Final   Special Requests IN PEDIATRIC BOTTLE Blood Culture adequate volume  Final   Culture   Final    NO GROWTH 2 DAYS Performed at [facility], [place] [address], [place], [place] [address]    Report Status PENDING  Incomplete  Culture, blood (routine x 2)     Status: None (Preliminary result)   Collection Time: 03/06/17  6:38 AM  Result Value Ref Range Status   Specimen Description BLOOD LEFT ARM  Final   Special Requests   Final    IN PEDIATRIC BOTTLE Blood Culture results may not be optimal due to an excessive volume of blood received in culture bottles   Culture   Final    NO GROWTH 2 DAYS Performed at [facility], [place] [address], [place], [place] [address]    Report Status PENDING  Incomplete         Radiology Studies: No results found.      Scheduled Meds: . apixaban  5 mg Oral BID  . docusate sodium  100 mg Oral BID  . feeding supplement (ENSURE ENLIVE)  237 mL Oral BID BM  . finasteride  5 mg Oral Daily  . FLUoxetine  20 mg Oral Daily  . insulin aspart  0-9 Units Subcutaneous Q6H  . insulin glargine  10 Units Subcutaneous  QHS  . metoprolol succinate  25 mg Oral Daily  . pantoprazole  40 mg Oral Daily  . senna-docusate  1 tablet Oral BID  . sodium chloride flush  3 mL Intravenous Q12H  . tamsulosin  0.4 mg Oral Daily   Continuous Infusions: . lactated ringers 10 mL/hr at 03/05/17 0954  . vancomycin       LOS: 4 days    Time spent: 35 montes.     Elmarie Shiley, MD [facility] Pager [phone]  If 7PM-7AM, please contact night-coverage [email or web address] Password [facility] 03/08/2017, 2:23 PM

## 2017-03-08 NOTE — Progress Notes (Signed)
Subjective: C/o left hip pain   Objective: Vital signs in last 24 hours: Temp:  [98.2 F (36.8 C)-98.5 F (36.9 C)] 98.4 F (36.9 C) (03/04 0757) Pulse Rate:  [79-85] 80 (03/04 0757) Resp:  [17-20] 17 (03/04 0757) BP: (158-167)/(65-75) 158/72 (03/04 0757) SpO2:  [98 %-99 %] 99 % (03/04 0757) Weight:  [171 lb 4.8 oz (77.7 kg)] 171 lb 4.8 oz (77.7 kg) (03/03 2100)  Intake/Output from previous day: 03/03 0701 - 03/04 0700 In: 0177 [P.O.:1140; I.V.:2300; IV Piggyback:200] Out: 3450 [Urine:3450] Intake/Output this shift: Total I/O In: 80 [P.O.:80] Out: 400 [Urine:400]  Recent Labs    03/06/17 0622 03/07/17 0522  HGB 8.6* 8.7*   Recent Labs    03/06/17 0622 03/07/17 0522  WBC 24.2* 20.4*  RBC 3.39* 3.33*  HCT 27.7* 27.5*  PLT 449* 452*   Recent Labs    03/07/17 0522 03/08/17 0402  NA 133* 134*  K 3.3* 3.5  CL 101 101  CO2 23 24  BUN 14 11  CREATININE 0.98 0.87  GLUCOSE 161* 144*  CALCIUM 10.4* 10.6*   No results for input(s): LABPT, INR in the last 72 hours.  Exam:   Wound vac removed.  Purulent drainage noted.  Sutures intact.  Bulky dressing applied.    Assessment/Plan: Continue present care.  Possible PICC line tomorrow per ID.     [name] 03/08/2017, 1:44 PM

## 2017-03-08 NOTE — Progress Notes (Signed)
[facility]   Reason for visit: Follow up on prosthetic joint infection and disseminated MRSA  Interval History: 1 blood culture with MRSE, a different blood culture with MRSA and all other cultures also with MRSA.  Infected hip after left hip hemiarthropalsty done 02/24/17.    Physical Exam: Constitutional:  Vitals:   03/08/17 0600 03/08/17 0757  BP: (!) 167/75 (!) 158/72  Pulse: 79 80  Resp: 18 17  Temp: 98.2 F (36.8 C) 98.4 F (36.9 C)  SpO2: 98% 99%   patient appears in NAD Eyes: anicteric HENT: no thrush Respiratory: Normal respiratory effort; CTA B Cardiovascular: RRR GI: soft, nt, nd  Review of Systems: Constitutional: negative for fevers and chills Gastrointestinal: negative for diarrhea  Lab Results  Component Value Date   WBC 20.4 (H) 03/07/2017   HGB 8.7 (L) 03/07/2017   HCT 27.5 (L) 03/07/2017   MCV 82.6 03/07/2017   PLT 452 (H) 03/07/2017    Lab Results  Component Value Date   CREATININE 0.87 03/08/2017   BUN 11 03/08/2017   NA 134 (L) 03/08/2017   K 3.5 03/08/2017   CL 101 03/08/2017   CO2 24 03/08/2017    Lab Results  Component Value Date   ALT 10 (L) 03/06/2017   AST 13 (L) 03/06/2017   ALKPHOS 188 (H) 03/06/2017     Microbiology: Recent Results (from the past 240 hour(s))  Urine culture     Status: Abnormal   Collection Time: 03/04/17  5:44 AM  Result Value Ref Range Status   Specimen Description   Final    URINE, CATHETERIZED Performed at [facility], [address] [place], [place] [address]    Special Requests   Final    NONE Performed at [facility], [address], [place], [place] [address]    Culture (A)  Final    20,000 COLONIES/mL METHICILLIN RESISTANT STAPHYLOCOCCUS AUREUS   Report Status 03/06/2017 FINAL  Final   Organism ID, Bacteria METHICILLIN RESISTANT STAPHYLOCOCCUS AUREUS (A)  Final      Susceptibility   Methicillin resistant staphylococcus aureus - MIC*    CIPROFLOXACIN >=8  RESISTANT Resistant     GENTAMICIN >=16 RESISTANT Resistant     NITROFURANTOIN <=16 SENSITIVE Sensitive     OXACILLIN >=4 RESISTANT Resistant     TETRACYCLINE <=1 SENSITIVE Sensitive     VANCOMYCIN 1 SENSITIVE Sensitive     TRIMETH/SULFA <=10 SENSITIVE Sensitive     CLINDAMYCIN >=8 RESISTANT Resistant     RIFAMPIN <=0.5 SENSITIVE Sensitive     Inducible Clindamycin NEGATIVE Sensitive     * 20,000 COLONIES/mL METHICILLIN RESISTANT STAPHYLOCOCCUS AUREUS  Culture, blood (routine x 2)     Status: Abnormal   Collection Time: 03/04/17  6:03 AM  Result Value Ref Range Status   Specimen Description   Final    BLOOD RIGHT ARM Performed at [facility], [address], [place], [place] [address]    Special Requests   Final    BOTTLES DRAWN AEROBIC AND ANAEROBIC Blood Culture adequate volume Performed at [facility], [address] [place], [place] [address]    Culture  Setup Time   Final    GRAM POSITIVE COCCI ANAEROBIC BOTTLE ONLY Gram Stain Report Called to,Read Back By and Verified With: MURPHY,N @0545  ON 3.1.19 BY BOWMAN,L Performed at [facility], [address], [place], [place] [address]    Culture METHICILLIN RESISTANT STAPHYLOCOCCUS AUREUS (A)  Final   Report Status 03/07/2017 FINAL  Final   Organism ID, Bacteria METHICILLIN RESISTANT STAPHYLOCOCCUS AUREUS  Final      Susceptibility   Methicillin resistant staphylococcus aureus - MIC*    CIPROFLOXACIN >=8 RESISTANT Resistant     ERYTHROMYCIN >=8 RESISTANT Resistant     GENTAMICIN >=16 RESISTANT Resistant     OXACILLIN >=4 RESISTANT Resistant     TETRACYCLINE <=1 SENSITIVE Sensitive     VANCOMYCIN <=0.5 SENSITIVE Sensitive     TRIMETH/SULFA <=10 SENSITIVE Sensitive     CLINDAMYCIN >=8 RESISTANT Resistant     RIFAMPIN <=0.5 SENSITIVE Sensitive     Inducible Clindamycin NEGATIVE Sensitive     * METHICILLIN RESISTANT STAPHYLOCOCCUS AUREUS  Culture, blood (routine x 2)     Status: Abnormal   Collection Time: 03/04/17  6:05 AM    Result Value Ref Range Status   Specimen Description   Final    BLOOD RIGHT FOREARM DRAWN BY RN Performed at [facility], [address], [place], [place] [address]    Special Requests   Final    BOTTLES DRAWN AEROBIC AND ANAEROBIC Blood Culture adequate volume Performed at [facility], [address] [place], [place] [address]    Culture  Setup Time   Final    GRAM POSITIVE COCCI IN BOTH AEROBIC AND ANAEROBIC BOTTLES Gram Stain Report Called to,Read Back By and Verified With: MURPHY,N @ 7628 ON 3.1.19 BY BOWMAN,L    Culture (A)  Final    STAPHYLOCOCCUS SPECIES (COAGULASE NEGATIVE) THE SIGNIFICANCE OF ISOLATING THIS ORGANISM FROM A SINGLE SET OF BLOOD CULTURES WHEN MULTIPLE SETS ARE DRAWN IS UNCERTAIN. PLEASE NOTIFY THE MICROBIOLOGY DEPARTMENT WITHIN ONE WEEK IF SPECIATION AND SENSITIVITIES ARE REQUIRED. Performed at [facility], [place] [address], [place], [place] [address]    Report Status 03/07/2017 FINAL  Final  Blood Culture ID Panel (Reflexed)     Status: Abnormal   Collection Time: 03/04/17  6:05 AM  Result Value Ref Range Status   Enterococcus species NOT DETECTED NOT DETECTED Final   Listeria monocytogenes NOT DETECTED NOT DETECTED Final   Staphylococcus species DETECTED (A) NOT DETECTED Final    Comment: Methicillin (oxacillin) resistant coagulase negative staphylococcus. Possible blood culture contaminant (unless isolated from more than one blood culture draw or clinical case suggests pathogenicity). No antibiotic treatment is indicated for blood  culture contaminants. CRITICAL RESULT CALLED TO, READ BACK BY AND VERIFIED WITH: M. Radford Pax Pharm.D. 9:40 03/05/17 (wilsonm)    Staphylococcus aureus NOT DETECTED NOT DETECTED Final   Methicillin resistance DETECTED (A) NOT DETECTED Final    Comment: CRITICAL RESULT CALLED TO, READ BACK BY AND VERIFIED WITH: M.  Radford Pax Pharm.D. 9:40 03/05/17 (wilsonm)    Streptococcus species NOT DETECTED NOT DETECTED Final   Streptococcus  agalactiae NOT DETECTED NOT DETECTED Final   Streptococcus pneumoniae NOT DETECTED NOT DETECTED Final   Streptococcus pyogenes NOT DETECTED NOT DETECTED Final   Acinetobacter baumannii NOT DETECTED NOT DETECTED Final   Enterobacteriaceae species NOT DETECTED NOT DETECTED Final   Enterobacter cloacae complex NOT DETECTED NOT DETECTED Final   Escherichia coli NOT DETECTED NOT DETECTED Final   Klebsiella oxytoca NOT DETECTED NOT DETECTED Final   Klebsiella pneumoniae NOT DETECTED NOT DETECTED Final   Proteus species NOT DETECTED NOT DETECTED Final   Serratia marcescens NOT DETECTED NOT DETECTED Final   Haemophilus influenzae NOT DETECTED NOT DETECTED Final   Neisseria meningitidis NOT DETECTED NOT DETECTED Final   Pseudomonas aeruginosa NOT DETECTED NOT DETECTED Final   Candida albicans NOT DETECTED  NOT DETECTED Final   Candida glabrata NOT DETECTED NOT DETECTED Final   Candida krusei NOT DETECTED NOT DETECTED Final   Candida parapsilosis NOT DETECTED NOT DETECTED Final   Candida tropicalis NOT DETECTED NOT DETECTED Final    Comment: Performed at [facility], [place] [address], [place], [place] [address]  Aerobic Culture (superficial specimen)     Status: None   Collection Time: 03/04/17  8:48 AM  Result Value Ref Range Status   Specimen Description   Final    HIP LEFT Performed at [facility], [address] [place], [place] [address]    Special Requests   Final    Immunocompromised Performed at [facility], [address], [place], [place] [address]    Gram Stain   Final    RARE WBC PRESENT, PREDOMINANTLY PMN FEW GRAM POSITIVE COCCI IN PAIRS    Culture   Final    ABUNDANT METHICILLIN RESISTANT STAPHYLOCOCCUS AUREUS ABUNDANT DIPHTHEROIDS(CORYNEBACTERIUM SPECIES) Standardized susceptibility testing for this organism is not available. Performed at [facility], [place] [address], [place], [place] [address]    Report Status 03/07/2017 FINAL  Final   Organism ID, Bacteria  METHICILLIN RESISTANT STAPHYLOCOCCUS AUREUS  Final      Susceptibility   Methicillin resistant staphylococcus aureus - MIC*    CIPROFLOXACIN >=8 RESISTANT Resistant     ERYTHROMYCIN >=8 RESISTANT Resistant     GENTAMICIN >=16 RESISTANT Resistant     OXACILLIN >=4 RESISTANT Resistant     TETRACYCLINE <=1 SENSITIVE Sensitive     VANCOMYCIN <=0.5 SENSITIVE Sensitive     TRIMETH/SULFA <=10 SENSITIVE Sensitive     CLINDAMYCIN >=8 RESISTANT Resistant     RIFAMPIN <=0.5 SENSITIVE Sensitive     Inducible Clindamycin NEGATIVE Sensitive     * ABUNDANT METHICILLIN RESISTANT STAPHYLOCOCCUS AUREUS  Aerobic Culture (superficial specimen)     Status: None   Collection Time: 03/05/17 11:42 AM  Result Value Ref Range Status   Specimen Description WOUND LEFT HIP  Final   Special Requests PATIENT ON FOLLOWING ZOSYN GENTAMYCIN  Final   Gram Stain   Final    FEW WBC PRESENT,BOTH PMN AND MONONUCLEAR FEW GRAM POSITIVE COCCI IN CLUSTERS Performed at [facility], [place] [address], [place],  [address]    Culture   Final    MODERATE METHICILLIN RESISTANT STAPHYLOCOCCUS AUREUS   Report Status 03/07/2017 FINAL  Final   Organism ID, Bacteria METHICILLIN RESISTANT STAPHYLOCOCCUS AUREUS  Final      Susceptibility   Methicillin resistant staphylococcus aureus - MIC*    CIPROFLOXACIN >=8 RESISTANT Resistant     ERYTHROMYCIN >=8 RESISTANT Resistant     GENTAMICIN <=0.5 SENSITIVE Sensitive     OXACILLIN >=4 RESISTANT Resistant     TETRACYCLINE <=1 SENSITIVE Sensitive     VANCOMYCIN 1 SENSITIVE Sensitive     TRIMETH/SULFA <=10 SENSITIVE Sensitive     CLINDAMYCIN >=8 RESISTANT Resistant     RIFAMPIN <=0.5 SENSITIVE Sensitive     Inducible Clindamycin NEGATIVE Sensitive     * MODERATE METHICILLIN RESISTANT STAPHYLOCOCCUS AUREUS  Culture, blood (routine x 2)     Status: None (Preliminary result)   Collection Time: 03/06/17  6:32 AM  Result Value Ref Range Status   Specimen Description BLOOD LEFT  ANTECUBITAL  Final   Special Requests IN PEDIATRIC BOTTLE Blood Culture adequate volume  Final   Culture   Final    NO GROWTH 1 DAY Performed at [facility], [address] [place], [place] [address]    Report Status PENDING  Incomplete  Culture, blood (routine x 2)     Status: None (Preliminary result)   Collection Time: 03/06/17  6:38 AM  Result Value Ref Range Status   Specimen Description BLOOD LEFT ARM  Final   Special Requests   Final    IN PEDIATRIC BOTTLE Blood Culture results may not be optimal due to an excessive volume of blood received in culture bottles   Culture   Final    NO GROWTH 1 DAY Performed at [facility], [place] [address], [place], [place] [address]    Report Status PENDING  Incomplete    Impression/Plan:  1. 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  On vancomycin.  No a candidate for rifampin with medication interactions. Will need 6 weeks of IV therapy followed by 3-6 months oral therapy with Bactrim or doxy and likely long term suppression after that.  2. MRSA bacteremia - I will order a TTE.  Repeat blood cultures ngtd. I do not feel TEE indicated unless there is some indication from the TTE.   3.  Access - ok for picc line tomorrow if repeat blood cultures remain negative.  4.  Leukocytosis - improving, c/w #1.

## 2017-03-08 NOTE — Progress Notes (Signed)
Pharmacy Antibiotic Note  [name] is a 84 y.o. male admitted on 03/04/2017 with confusion/hip pain from surgical site from hip surgery done on 2/20. Pharmacy is consulted for Vancomycin dosing.  Noted blood cultures growing MRSE in 3 of 4 bottles - likely true pathogen given recent surgery and wound infection. Wound culture with MRSA. SCr has trended down, will increase the Vancomycin dose today to target a VT of 15-20 mcg/ml  Plan: 1. Increase Vancomycin to 750 mg IV every 12 hours 3. Will f/u BMET in the AM to evaluate renal function 4. Will continue to follow renal function, culture results, LOT, vancomycin trough prn  Height: 5\' 7"  (170.2 cm) Weight: 171 lb 4.8 oz (77.7 kg) IBW/kg (Calculated) : 66.1  Temp (24hrs), Avg:98.4 F (36.9 C), Min:98.2 F (36.8 C), Max:98.5 F (36.9 C)  Recent Labs  Lab 03/04/17 0602 03/04/17 0652 03/04/17 0840 03/06/17 0622 03/07/17 0522 03/08/17 0402  WBC 34.0*  --   --  24.2* 20.4*  --   CREATININE 1.41*  --   --  1.06 0.98 0.87  LATICACIDVEN  --  2.43* 1.0  --   --   --     Estimated Creatinine Clearance: 60.1 mL/min (by C-G formula based on SCr of 0.87 mg/dL).    No Known Allergies  Antimicrobials this admission: Vancomycin 2/28>> Zosyn 2/28>>3/1  Dose adjustments this admission: 3/4: Vancomycin to 750 mg q 12 hour (from 1 gram q 24 hour)  Microbiology results: 2/28 BCx >> 3/4 MRSE 3/1 WCx (intra-op) >>MRSA 3/2 BCx >> ngtd   Thank you for allowing pharmacy to participate in this patients care.  Jens Som, PharmD Clinical phone for 03/08/2017 from 7a-3:30p: [phone] If after 3:30p, please call main pharmacy at: [phone] 03/08/2017 11:43 AM

## 2017-03-08 NOTE — Progress Notes (Addendum)
RN to remove incisional VAC. ( not a deep wound VAC). He has antibiotic beads which will continue to make the wound drain and has retention sutures in place.  4X4's  ABD's and tape.  Cultures positive for MRSA. Will need 6 wks IV Vanc for deep infection   and will need to go to SNF.  I saw him yesterday but forgot to write note. Will see late this afternoon.   My cell [phone]

## 2017-03-09 ENCOUNTER — Inpatient Hospital Stay (HOSPITAL_COMMUNITY): Payer: Medicare Other

## 2017-03-09 DIAGNOSIS — Z5181 Encounter for therapeutic drug level monitoring: Secondary | ICD-10-CM

## 2017-03-09 DIAGNOSIS — I340 Nonrheumatic mitral (valve) insufficiency: Secondary | ICD-10-CM

## 2017-03-09 DIAGNOSIS — M00052 Staphylococcal arthritis, left hip: Secondary | ICD-10-CM

## 2017-03-09 DIAGNOSIS — H9190 Unspecified hearing loss, unspecified ear: Secondary | ICD-10-CM

## 2017-03-09 LAB — GLUCOSE, CAPILLARY
GLUCOSE-CAPILLARY: 207 mg/dL — AB (ref 65–99)
GLUCOSE-CAPILLARY: 196 mg/dL — AB (ref 65–99)
GLUCOSE-CAPILLARY: 213 mg/dL — AB (ref 65–99)
Glucose-Capillary: 162 mg/dL — ABNORMAL HIGH (ref 65–99)

## 2017-03-09 LAB — CBC
HEMATOCRIT: 28.6 % — AB (ref 39.0–52.0)
HEMOGLOBIN: 8.9 g/dL — AB (ref 13.0–17.0)
MCH: 25.4 pg — ABNORMAL LOW (ref 26.0–34.0)
MCHC: 31.1 g/dL (ref 30.0–36.0)
MCV: 81.7 fL (ref 78.0–100.0)
Platelets: 457 10*3/uL — ABNORMAL HIGH (ref 150–400)
RBC: 3.5 MIL/uL — AB (ref 4.22–5.81)
RDW: 16.8 % — ABNORMAL HIGH (ref 11.5–15.5)
WBC: 21.6 10*3/uL — AB (ref 4.0–10.5)

## 2017-03-09 LAB — ECHOCARDIOGRAM COMPLETE
HEIGHTINCHES: 67 in
WEIGHTICAEL: 2740.76 [oz_av]

## 2017-03-09 LAB — BASIC METABOLIC PANEL
ANION GAP: 8 (ref 5–15)
BUN: 10 mg/dL (ref 6–20)
CHLORIDE: 100 mmol/L — AB (ref 101–111)
CO2: 26 mmol/L (ref 22–32)
Calcium: 10.7 mg/dL — ABNORMAL HIGH (ref 8.9–10.3)
Creatinine, Ser: 0.85 mg/dL (ref 0.61–1.24)
GFR calc Af Amer: >60 mL/min (ref >60)
Glucose, Bld: 208 mg/dL — ABNORMAL HIGH (ref 65–99)
POTASSIUM: 3.5 mmol/L (ref 3.5–5.1)
SODIUM: 134 mmol/L — AB (ref 135–145)

## 2017-03-09 MED ORDER — PERFLUTREN LIPID MICROSPHERE
1.0000 mL | INTRAVENOUS | Status: AC | PRN
  Administered 2017-03-09: 2 mL via INTRAVENOUS
  Filled 2017-03-09 (×2): qty 10

## 2017-03-09 MED ORDER — INSULIN ASPART 100 UNIT/ML ~~LOC~~ SOLN
0.0000 [IU] | Freq: Three times a day (TID) | SUBCUTANEOUS | Status: DC
  Administered 2017-03-09: 5 [IU] via SUBCUTANEOUS
  Administered 2017-03-09: 3 [IU] via SUBCUTANEOUS
  Administered 2017-03-10 (×2): 5 [IU] via SUBCUTANEOUS
  Administered 2017-03-11 – 2017-03-12 (×3): 3 [IU] via SUBCUTANEOUS
  Administered 2017-03-12: 2 [IU] via SUBCUTANEOUS
  Administered 2017-03-14 (×2): 3 [IU] via SUBCUTANEOUS
  Administered 2017-03-15 (×2): 2 [IU] via SUBCUTANEOUS
  Administered 2017-03-16: 5 [IU] via SUBCUTANEOUS
  Administered 2017-03-16 – 2017-03-17 (×3): 3 [IU] via SUBCUTANEOUS
  Administered 2017-03-17 (×2): 5 [IU] via SUBCUTANEOUS
  Administered 2017-03-18: 3 [IU] via SUBCUTANEOUS
  Administered 2017-03-18: 2 [IU] via SUBCUTANEOUS
  Administered 2017-03-18 – 2017-03-19 (×3): 3 [IU] via SUBCUTANEOUS

## 2017-03-09 NOTE — Progress Notes (Signed)
PROGRESS NOTE    [name]  XHB:[id number] DOB: [birth year] DOA: 03/04/2017 PCP: [name], MD    Brief Narrative: [name] a 84 y.o.malefrom home where he was found in his bed with a lot of bloody drainage from around his surgical site where he had a left hip surgery done on February 20 by Dr. Lorin Mercy. Admitted for acute metabolic encephalopathy and sepsis 2/2 to surgical wound infection.  Patient admitted with left Hemiarthroplasty infection, underwent Underwent I and D left hip and placement of prostalac absorbable vancomycin and gentamycin beads on 3-01 by Dr Lorin Mercy. Culture from wound grew MRSA. He was also diagnosed with MRSA Bacteremia. ID has been following patient in consultation. Plan for 6 Weeks of IV antibiotics. Repeated blood culture negative to date. Patient will undergo PIIC line placement.   Patient was evaluated today 3-05 by Dr Lorin Mercy and was found to have significant drainage form left hip. Now considering repeating I and D in the OR if drainage continue and Leukocytosis doesn't improved.  Patient also develops acute encephalopathy related to infection, which has been stable.    Assessment & Plan:   Principal Problem:   Wound infection after surgery Active Problems:   Leukocytosis   Chronic diastolic CHF (congestive heart failure) (HCC)   Acute renal failure superimposed on stage 3 chronic kidney disease (HCC)   Atrial fibrillation with normal ventricular rate (HCC)   CAD (coronary artery disease)   Anemia in chronic kidney disease (CKD)   Sepsis ([place])   Acute delirium   1-Hemiarthroplasty left hip; infection. MRSA infection.  Recent left hip surgery 02/24/17 Underwent I and D left hip and placement of prostalac absorbable vancomycin and gentamycin beads on 3-01 He will need long term antibiotics suppression.  ID following.  Continue with vancomycin. Needs 6 weeks of IV antibiotics.  Cultures from wound; Moderate staph aureus.  WBC trending  down 34---24--20-19--21 Wound vac was discontinue 3-04. Patient today notice to have more drainage from wound.  Dr Lorin Mercy considering repeating I and D in the OR.   MRSA Bacteremia; Staph Aureus Bacteremia; blood cultures 2-28 and staph not aureus in second set blood culture.  On IV antibiotics.  ID following.  Repeated Blood cultures 3-02 no growth in 3 days. Picc line placement on 3-05 ECHO ordered.  Needs 6 weeks of IV antibiotics, follow by 3-6 months of oral therapy (Bactrim or Doxy, and likely long term suppression after_ per ID recommendations. ,.   Sepsis; secondary to wound post hip replacement.  Vitals stable. On IV antibiotics. Vancomycin. Monitor for nephrotoxicity.   Dyspnea; stable. No hypoxemia.  chest x ray, which was negative.  Nebulizer PRN Incentive spirometry   Acute metabolic encephalopathy;  Related to infection. Appears calm today,.  MS fluctuates. Confuse.   AKI on CKD stage III;  Baseline cr 1.1.  IV fluids.  Monitor renal function.   Mild hypercalcemia; repeat labs in am. On IV fluids.   Chronic A Fib;  Continue with metoprolol.  eliquis was started 3-02  Chronic anemia;  monitor hb.  Hb has been 9 range , hb at 8.9  Monitor closely on eliquis.  Awaiting hb level.   DM;  lantus 10 units HS.  SSI.   Constipation; had multiples BM.   Depression/anxiety -prozac  BPH -finasteride, tamsulosin -monitor urine output  Hypokalemia; replaced.   DVT prophylaxis: eliquis.  Code Status: partial  Family Communication: no family at bedside.  Disposition Plan: needs SNF Consultants:   Ortho  ID   Procedures:   Left Hip I and d   Antimicrobials:  vancomycin    Subjective: He is alert, was able to tell me he is in the hospital. Denies worsening hip pain.   Objective: Vitals:   03/08/17 1813 03/08/17 2158 03/09/17 0433 03/09/17 0900  BP: (!) 154/68 (!) 162/70 (!) 182/71 (!) 180/74  Pulse: 86 80 83 82  Resp: 16 17 16 18     Temp: 98.5 F (36.9 C) 98.3 F (36.8 C) 98 F (36.7 C) 98.2 F (36.8 C)  TempSrc: Oral Oral Oral Oral  SpO2: 98% 98% 97% 98%  Weight:      Height:        Intake/Output Summary (Last 24 hours) at 03/09/2017 1421 Last data filed at 03/09/2017 0900 Gross per 24 hour  Intake 620 ml  Output 1050 ml  Net -430 ml   Filed Weights   03/05/17 0457 03/07/17 2100  Weight: 74 kg (163 lb 2.2 oz) 77.7 kg (171 lb 4.8 oz)    Examination:  General exam; NAD, pale Respiratory system: Normal respiratory effort, CTA Cardiovascular system: S 1, S 2 RRR Gastrointestinal system: BS present, soft, nt Central nervous system: Alert, confuse.  Extremities: left hip with serosanguineous drainage, dressing in place.  Skin: No rashes.  Psychiatry: Confuse.    Data Reviewed: I have personally reviewed following labs and imaging studies  CBC: Recent Labs  Lab 03/04/17 0602 03/06/17 0622 03/07/17 0522 03/08/17 1545 03/09/17 0747  WBC 34.0* 24.2* 20.4* 19.2* 21.6*  NEUTROABS 32.0* 21.3* 18.2*  --   --   HGB 9.5* 8.6* 8.7* 8.4* 8.9*  HCT 30.8* 27.7* 27.5* 27.4* 28.6*  MCV 81.9 81.7 82.6 81.5 81.7  PLT 407* 449* 452* 498* 709*   Basic Metabolic Panel: Recent Labs  Lab 03/04/17 0602 03/06/17 0622 03/07/17 0522 03/08/17 0402 03/09/17 0747  NA 131* 134* 133* 134* 134*  K 4.0 3.5 3.3* 3.5 3.5  CL 94* 100* 101 101 100*  CO2 22 25 23 24 26   GLUCOSE 212* 182* 161* 144* 208*  BUN 33* 19 14 11 10   CREATININE 1.41* 1.06 0.98 0.87 0.85  CALCIUM 10.7* 10.6* 10.4* 10.6* 10.7*  MG  --  1.9  --   --   --    GFR: Estimated Creatinine Clearance: 61.6 mL/min (by C-G formula based on SCr of 0.85 mg/dL). Liver Function Tests: Recent Labs  Lab 03/04/17 0602 03/06/17 0622  AST 20 13*  ALT 14* 10*  ALKPHOS 155* 188*  BILITOT 1.7* 1.2  PROT 6.6 5.3*  ALBUMIN 2.6* 2.0*   No results for input(s): LIPASE, AMYLASE in the last 168 hours. No results for input(s): AMMONIA in the last 168  hours. Coagulation Profile: No results for input(s): INR, PROTIME in the last 168 hours. Cardiac Enzymes: No results for input(s): CKTOTAL, CKMB, CKMBINDEX, TROPONINI in the last 168 hours. BNP (last 3 results) No results for input(s): PROBNP in the last 8760 hours. HbA1C: No results for input(s): HGBA1C in the last 72 hours. CBG: Recent Labs  Lab 03/08/17 1151 03/08/17 1644 03/09/17 0104 03/09/17 0741 03/09/17 1147  GLUCAP 125* 111* 162* 207* 196*   Lipid Profile: No results for input(s): CHOL, HDL, LDLCALC, TRIG, CHOLHDL, LDLDIRECT in the last 72 hours. Thyroid Function Tests: No results for input(s): TSH, T4TOTAL, FREET4, T3FREE, THYROIDAB in the last 72 hours. Anemia Panel: No results for input(s): VITAMINB12, FOLATE, FERRITIN, TIBC, IRON, RETICCTPCT in the last 72 hours. Sepsis Labs: Recent Labs  Lab  03/04/17 3810 03/04/17 0840  LATICACIDVEN 2.43* 1.0    Recent Results (from the past 240 hour(s))  Urine culture     Status: Abnormal   Collection Time: 03/04/17  5:44 AM  Result Value Ref Range Status   Specimen Description   Final    URINE, CATHETERIZED Performed at [facility], [address], [place], [place] [address]    Special Requests   Final    NONE Performed at [facility], [address], [place], [place] [address]    Culture (A)  Final    20,000 COLONIES/mL METHICILLIN RESISTANT STAPHYLOCOCCUS AUREUS   Report Status 03/06/2017 FINAL  Final   Organism ID, Bacteria METHICILLIN RESISTANT STAPHYLOCOCCUS AUREUS (A)  Final      Susceptibility   Methicillin resistant staphylococcus aureus - MIC*    CIPROFLOXACIN >=8 RESISTANT Resistant     GENTAMICIN >=16 RESISTANT Resistant     NITROFURANTOIN <=16 SENSITIVE Sensitive     OXACILLIN >=4 RESISTANT Resistant     TETRACYCLINE <=1 SENSITIVE Sensitive     VANCOMYCIN 1 SENSITIVE Sensitive     TRIMETH/SULFA <=10 SENSITIVE Sensitive     CLINDAMYCIN >=8 RESISTANT Resistant     RIFAMPIN <=0.5 SENSITIVE Sensitive      Inducible Clindamycin NEGATIVE Sensitive     * 20,000 COLONIES/mL METHICILLIN RESISTANT STAPHYLOCOCCUS AUREUS  Culture, blood (routine x 2)     Status: Abnormal   Collection Time: 03/04/17  6:03 AM  Result Value Ref Range Status   Specimen Description   Final    BLOOD RIGHT ARM Performed at [facility], [address], [place], [place] [address]    Special Requests   Final    BOTTLES DRAWN AEROBIC AND ANAEROBIC Blood Culture adequate volume Performed at [facility], [address], [place], [place] [address]    Culture  Setup Time   Final    GRAM POSITIVE COCCI ANAEROBIC BOTTLE ONLY Gram Stain Report Called to,Read Back By and Verified With: MURPHY,N @0545  ON 3.1.19 BY BOWMAN,L Performed at [facility], [address], [place], [place] [address]    Culture METHICILLIN RESISTANT STAPHYLOCOCCUS AUREUS (A)  Final   Report Status 03/07/2017 FINAL  Final   Organism ID, Bacteria METHICILLIN RESISTANT STAPHYLOCOCCUS AUREUS  Final      Susceptibility   Methicillin resistant staphylococcus aureus - MIC*    CIPROFLOXACIN >=8 RESISTANT Resistant     ERYTHROMYCIN >=8 RESISTANT Resistant     GENTAMICIN >=16 RESISTANT Resistant     OXACILLIN >=4 RESISTANT Resistant     TETRACYCLINE <=1 SENSITIVE Sensitive     VANCOMYCIN <=0.5 SENSITIVE Sensitive     TRIMETH/SULFA <=10 SENSITIVE Sensitive     CLINDAMYCIN >=8 RESISTANT Resistant     RIFAMPIN <=0.5 SENSITIVE Sensitive     Inducible Clindamycin NEGATIVE Sensitive     * METHICILLIN RESISTANT STAPHYLOCOCCUS AUREUS  Culture, blood (routine x 2)     Status: Abnormal   Collection Time: 03/04/17  6:05 AM  Result Value Ref Range Status   Specimen Description   Final    BLOOD RIGHT FOREARM DRAWN BY RN Performed at [facility], [address], [place], [place] [address]    Special Requests   Final    BOTTLES DRAWN AEROBIC AND ANAEROBIC Blood Culture adequate volume Performed at [facility], [address], [place],  [address]    Culture   Setup Time   Final    GRAM POSITIVE COCCI IN BOTH AEROBIC AND ANAEROBIC BOTTLES Gram Stain Report Called to,Read Back  By and Verified With: MURPHY,N @ 0545 ON 3.1.19 BY BOWMAN,L    Culture (A)  Final    STAPHYLOCOCCUS SPECIES (COAGULASE NEGATIVE) THE SIGNIFICANCE OF ISOLATING THIS ORGANISM FROM A SINGLE SET OF BLOOD CULTURES WHEN MULTIPLE SETS ARE DRAWN IS UNCERTAIN. PLEASE NOTIFY THE MICROBIOLOGY DEPARTMENT WITHIN ONE WEEK IF SPECIATION AND SENSITIVITIES ARE REQUIRED. Performed at [facility], [place] [address], [place], [place] [address]    Report Status 03/07/2017 FINAL  Final  Blood Culture ID Panel (Reflexed)     Status: Abnormal   Collection Time: 03/04/17  6:05 AM  Result Value Ref Range Status   Enterococcus species NOT DETECTED NOT DETECTED Final   Listeria monocytogenes NOT DETECTED NOT DETECTED Final   Staphylococcus species DETECTED (A) NOT DETECTED Final    Comment: Methicillin (oxacillin) resistant coagulase negative staphylococcus. Possible blood culture contaminant (unless isolated from more than one blood culture draw or clinical case suggests pathogenicity). No antibiotic treatment is indicated for blood  culture contaminants. CRITICAL RESULT CALLED TO, READ BACK BY AND VERIFIED WITH: M. Radford Pax Pharm.D. 9:40 03/05/17 ([email or web address])    Staphylococcus aureus NOT DETECTED NOT DETECTED Final   Methicillin resistance DETECTED (A) NOT DETECTED Final    Comment: CRITICAL RESULT CALLED TO, READ BACK BY AND VERIFIED WITH: M.  Radford Pax Pharm.D. 9:40 03/05/17 ([email or web address])    Streptococcus species NOT DETECTED NOT DETECTED Final   Streptococcus agalactiae NOT DETECTED NOT DETECTED Final   Streptococcus pneumoniae NOT DETECTED NOT DETECTED Final   Streptococcus pyogenes NOT DETECTED NOT DETECTED Final   Acinetobacter baumannii NOT DETECTED NOT DETECTED Final   Enterobacteriaceae species NOT DETECTED NOT DETECTED Final   Enterobacter cloacae complex NOT DETECTED NOT DETECTED Final    Escherichia coli NOT DETECTED NOT DETECTED Final   Klebsiella oxytoca NOT DETECTED NOT DETECTED Final   Klebsiella pneumoniae NOT DETECTED NOT DETECTED Final   Proteus species NOT DETECTED NOT DETECTED Final   Serratia marcescens NOT DETECTED NOT DETECTED Final   Haemophilus influenzae NOT DETECTED NOT DETECTED Final   Neisseria meningitidis NOT DETECTED NOT DETECTED Final   Pseudomonas aeruginosa NOT DETECTED NOT DETECTED Final   Candida albicans NOT DETECTED NOT DETECTED Final   Candida glabrata NOT DETECTED NOT DETECTED Final   Candida krusei NOT DETECTED NOT DETECTED Final   Candida parapsilosis NOT DETECTED NOT DETECTED Final   Candida tropicalis NOT DETECTED NOT DETECTED Final    Comment: Performed at [facility], [place] [address], [place], [place] [address]  Aerobic Culture (superficial specimen)     Status: None   Collection Time: 03/04/17  8:48 AM  Result Value Ref Range Status   Specimen Description   Final    HIP LEFT Performed at [facility], [address], [place], [place] [address]    Special Requests   Final    Immunocompromised Performed at [facility], [address], [place], [place] [address]    Gram Stain   Final    RARE WBC PRESENT, PREDOMINANTLY PMN FEW GRAM POSITIVE COCCI IN PAIRS    Culture   Final    ABUNDANT METHICILLIN RESISTANT STAPHYLOCOCCUS AUREUS ABUNDANT DIPHTHEROIDS(CORYNEBACTERIUM SPECIES) Standardized susceptibility testing for this organism is not available. Performed at [facility], [place] [address] [place], [place] [address]    Report Status 03/07/2017 FINAL  Final   Organism ID, Bacteria METHICILLIN RESISTANT STAPHYLOCOCCUS AUREUS  Final      Susceptibility   Methicillin resistant staphylococcus aureus - MIC*    CIPROFLOXACIN >=8  RESISTANT Resistant     ERYTHROMYCIN >=8 RESISTANT Resistant     GENTAMICIN >=16 RESISTANT Resistant     OXACILLIN >=4 RESISTANT Resistant     TETRACYCLINE <=1 SENSITIVE Sensitive     VANCOMYCIN  <=0.5 SENSITIVE Sensitive     TRIMETH/SULFA <=10 SENSITIVE Sensitive     CLINDAMYCIN >=8 RESISTANT Resistant     RIFAMPIN <=0.5 SENSITIVE Sensitive     Inducible Clindamycin NEGATIVE Sensitive     * ABUNDANT METHICILLIN RESISTANT STAPHYLOCOCCUS AUREUS  Aerobic Culture (superficial specimen)     Status: None   Collection Time: 03/05/17 11:42 AM  Result Value Ref Range Status   Specimen Description WOUND LEFT HIP  Final   Special Requests PATIENT ON FOLLOWING ZOSYN GENTAMYCIN  Final   Gram Stain   Final    FEW WBC PRESENT,BOTH PMN AND MONONUCLEAR FEW GRAM POSITIVE COCCI IN CLUSTERS Performed at [facility], [place] [address], [place], [place] [address]    Culture   Final    MODERATE METHICILLIN RESISTANT STAPHYLOCOCCUS AUREUS   Report Status 03/07/2017 FINAL  Final   Organism ID, Bacteria METHICILLIN RESISTANT STAPHYLOCOCCUS AUREUS  Final      Susceptibility   Methicillin resistant staphylococcus aureus - MIC*    CIPROFLOXACIN >=8 RESISTANT Resistant     ERYTHROMYCIN >=8 RESISTANT Resistant     GENTAMICIN <=0.5 SENSITIVE Sensitive     OXACILLIN >=4 RESISTANT Resistant     TETRACYCLINE <=1 SENSITIVE Sensitive     VANCOMYCIN 1 SENSITIVE Sensitive     TRIMETH/SULFA <=10 SENSITIVE Sensitive     CLINDAMYCIN >=8 RESISTANT Resistant     RIFAMPIN <=0.5 SENSITIVE Sensitive     Inducible Clindamycin NEGATIVE Sensitive     * MODERATE METHICILLIN RESISTANT STAPHYLOCOCCUS AUREUS  Culture, blood (routine x 2)     Status: None (Preliminary result)   Collection Time: 03/06/17  6:32 AM  Result Value Ref Range Status   Specimen Description BLOOD LEFT ANTECUBITAL  Final   Special Requests IN PEDIATRIC BOTTLE Blood Culture adequate volume  Final   Culture   Final    NO GROWTH 3 DAYS Performed at [facility], [place] [address], [place], [place] [address]    Report Status PENDING  Incomplete  Culture, blood (routine x 2)     Status: None (Preliminary result)   Collection Time: 03/06/17   6:38 AM  Result Value Ref Range Status   Specimen Description BLOOD LEFT ARM  Final   Special Requests   Final    IN PEDIATRIC BOTTLE Blood Culture results may not be optimal due to an excessive volume of blood received in culture bottles   Culture   Final    NO GROWTH 3 DAYS Performed at [facility], [place] [address], [place], [place] [address]    Report Status PENDING  Incomplete         Radiology Studies: No results found.      Scheduled Meds: . apixaban  5 mg Oral BID  . docusate sodium  100 mg Oral BID  . feeding supplement (ENSURE ENLIVE)  237 mL Oral BID BM  . finasteride  5 mg Oral Daily  . FLUoxetine  20 mg Oral Daily  . insulin aspart  0-15 Units Subcutaneous TID WC  . insulin glargine  10 Units Subcutaneous QHS  . metoprolol succinate  25 mg Oral Daily  . pantoprazole  40 mg Oral Daily  . senna-docusate  1 tablet Oral BID  . sodium chloride  flush  3 mL Intravenous Q12H  . tamsulosin  0.4 mg Oral Daily   Continuous Infusions: . vancomycin Stopped (03/09/17 0852)     LOS: 5 days    Time spent: 35 montes.     Elmarie Shiley, MD [facility] Pager [phone]  If 7PM-7AM, please contact night-coverage [email or web address] Password [email or web address] 03/09/2017, 2:21 PM

## 2017-03-09 NOTE — Progress Notes (Signed)
PT Cancellation Note  Patient Details Name: [name] MRN: [id number] DOB: [birth year]   Cancelled Treatment:    Reason Eval/Treat Not Completed: Other (comment).  Pt had multiple conflicts with nursing care and lunch, then was in echocardiogram when PT last attempted.  Will try later as time and pt allow.   Ramond Dial 03/09/2017, 1:55 PM   Mee Hives, PT MS Acute Rehab Dept. Number: [place] and [place]

## 2017-03-09 NOTE — Progress Notes (Signed)
Patient ID: [name], male   DOB: 05-Jan-1932, 84 y.o.   MRN: [id number] WBC went down to 19 and now back up to 21.6.   Will post for repeat surgery Wednesday afternoon with repeat washout and placement of new ABX beads and VAC.  Low albumin, low total protein  36malnourished0, and diabetes present . Will have to have VAC changes 3X per week by nursing staff post op. NPO after MN

## 2017-03-09 NOTE — Progress Notes (Signed)
  Echocardiogram 2D Echocardiogram has been performed.  [name] G Raina Sole 03/09/2017, 2:52 PM

## 2017-03-09 NOTE — Progress Notes (Signed)
   Subjective: 4 Days Post-Op Procedure(s) (LRB): LEFT HIP IRRIGATION AND DEBRIDEMENT AND PLACEMENT OF ANTIBIOTIC BEADS (Left) Patient reports pain as mild.    Objective: Vital signs in last 24 hours: Temp:  [98 F (36.7 C)-98.5 F (36.9 C)] 98 F (36.7 C) (03/05 0433) Pulse Rate:  [80-86] 83 (03/05 0433) Resp:  [16-17] 16 (03/05 0433) BP: (154-182)/(68-72) 182/71 (03/05 0433) SpO2:  [97 %-99 %] 97 % (03/05 0433)  Intake/Output from previous day: 03/04 0701 - 03/05 0700 In: 460 [P.O.:200; I.V.:110; IV Piggyback:150] Out: 2350 [Urine:2350] Intake/Output this shift: No intake/output data recorded.  Recent Labs    03/07/17 0522 03/08/17 1545  HGB 8.7* 8.4*   Recent Labs    03/07/17 0522 03/08/17 1545  WBC 20.4* 19.2*  RBC 3.33* 3.36*  HCT 27.5* 27.4*  PLT 452* 498*   Recent Labs    03/07/17 0522 03/08/17 0402  NA 133* 134*  K 3.3* 3.5  CL 101 101  CO2 23 24  BUN 14 11  CREATININE 0.98 0.87  GLUCOSE 161* 144*  CALCIUM 10.4* 10.6*   No results for input(s): LABPT, INR in the last 72 hours.  Physical exam:   Bloody/serous drainage on dressing.  No results found.  Assessment/Plan: 4 Days Post-Op Procedure(s) (LRB): LEFT HIP IRRIGATION AND DEBRIDEMENT AND PLACEMENT OF ANTIBIOTIC BEADS (Left) Up with therapy.    Dressing change q shift. WBC coming down, if drainage does not slow down and WBC remains elevated may have to repeat I and D in OR.     [name] 03/09/2017, 7:32 AM

## 2017-03-09 NOTE — Evaluation (Signed)
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 Left hip hemiarthroplasty done 02/24/2017.  Admitted again after being found at home with left hip infection and confusion. PMHx:  a-fib, DM, CKD 3, BK infection, UTI, osteopenia, COPD, DVT, cellulitis, CAD, CABG   Clinical Impression   Pt with decline in function and safety with ADLs and ADL mobility with decreased strength, balance, endurance and cognition. Pt would benefit from acute OT services to address impairments to maximize level of function and safety. Recommending short term SNF for further rehab intervention after acute d/c before d/c home    Follow Up Recommendations  SNF;Supervision/Assistance - 24 hour    Equipment Recommendations  None recommended by OT;Other (comment)(TBD at next venue of care)    Recommendations for Other Services       Precautions / Restrictions Precautions Precautions: Posterior Hip Precaution Comments: pt confused and not understanding WBAT status, however is not willing to attempt transfers with OT and nursing staff, very anxious and fearful Restrictions Weight Bearing Restrictions: Yes LLE Weight Bearing: Weight bearing as tolerated Other Position/Activity Restrictions: ok for transfers      Mobility Bed Mobility Overal bed mobility: Needs Assistance Bed Mobility: Sit to Supine;Supine to Sit     Supine to sit: HOB elevated;Max assist Sit to supine: HOB elevated;Max assist   General bed mobility comments: Requires verbal and tactile cues, extra time for one step tasks. increased  assist with LEs.  Transfers                 General transfer comment: NT, pt refused to attempt with OT and nursing staff. Per PT note, mod A with  tansfers    Balance Overall balance assessment: Needs assistance;History of Falls Sitting-balance support: Single extremity supported Sitting balance-Leahy Scale: Poor                                     ADL either performed or assessed with clinical judgement   ADL Overall ADL's : Needs assistance/impaired Eating/Feeding: Set up;Sitting Eating/Feeding Details (indicate cue type and reason): needed assist to open Ensure bottle Grooming: Wash/dry hands;Wash/dry face;Set up;Bed level;Sitting   Upper Body Bathing: Moderate assistance Upper Body Bathing Details (indicate cue type and reason): simulated, required increased time and multimodal cues to initiate Lower Body Bathing: Total assistance   Upper Body Dressing : Moderate assistance   Lower Body Dressing: Total assistance     Toilet Transfer Details (indicate cue type and reason): NT, refused Toileting- Clothing Manipulation and Hygiene: Total assistance;Bed level         General ADL Comments: simulated, required increased time and multimodal cues to initiate     Vision Baseline Vision/History: Wears glasses(per pt report) Patient Visual Report: No change from baseline(per pt report)       Perception     Praxis      Pertinent Vitals/Pain Pain Assessment: 0-10 Pain Score: 8  Pain Location: L hip Pain Descriptors / Indicators: Operative site guarding;Grimacing;Sore Pain Intervention(s): Limited activity within patient's tolerance;Monitored during session;Repositioned;Premedicated before session     Hand Dominance Right   Extremity/Trunk Assessment Upper Extremity Assessment Upper Extremity Assessment: Generalized weakness   Lower Extremity Assessment Lower  Extremity Assessment: Defer to PT evaluation   Cervical / Trunk Assessment Cervical / Trunk Assessment: Kyphotic   Communication Communication Communication: HOH   Cognition Arousal/Alertness: Awake/alert Behavior During Therapy: Flat  affect Overall Cognitive Status: Impaired/Different from baseline Area of Impairment: Attention;Memory;Following commands;Safety/judgement;Awareness;Problem solving                   Current Attention Level: Selective Memory: Decreased short-term memory;Decreased recall of precautions Following Commands: Follows one step commands inconsistently;Follows one step commands with increased time Safety/Judgement: Decreased awareness of deficits;Decreased awareness of safety   Problem Solving: Slow processing;Decreased initiation;Difficulty sequencing;Requires verbal cues;Requires tactile cues General Comments: pt not answering questions related to PLOF, was changing subject   General Comments       Exercises     Shoulder Instructions      Home Living Family/patient expects to be discharged to:: Skilled nursing facility Living Arrangements: Alone Available Help at Discharge: Family;Available PRN/intermittently Type of Home: Mobile home Home Access: Stairs to enter Entrance Stairs-Number of Steps: 3 Entrance Stairs-Rails: Can reach both Home Layout: One level     Bathroom Shower/Tub: Occupational psychologist: Standard     Home Equipment: None          Prior Functioning/Environment Level of Independence: Needs assistance  Gait / Transfers Assistance Needed: did not state when asked multiple times, changing subject ADL's / Homemaking Assistance Needed: did not state when asked multiple times, changing subject            OT Problem List: Decreased strength;Decreased activity tolerance;Decreased cognition;Decreased knowledge of use of DME or AE;Impaired balance (sitting and/or standing);Decreased safety awareness;Decreased knowledge of precautions;Pain      OT Treatment/Interventions: Self-care/ADL training;DME and/or AE instruction;Therapeutic activities;Balance training;Therapeutic exercise;Patient/family education    OT Goals(Current goals can be found in  the care plan section) Acute Rehab OT Goals Patient Stated Goal: none stated OT Goal Formulation: With patient Time For Goal Achievement: 03/23/17 Potential to Achieve Goals: Good ADL Goals Pt Will Perform Grooming: with min assist;with min guard assist;sitting Pt Will Perform Upper Body Bathing: with min assist;sitting Pt Will Perform Lower Body Bathing: with max assist;with mod assist;sitting/lateral leans;with caregiver independent in assisting Pt Will Perform Upper Body Dressing: with min assist;with min guard assist;sitting Pt Will Transfer to Toilet: with min assist;squat pivot transfer;stand pivot transfer;anterior/posterior transfer;bedside commode Additional ADL Goal #1: pt will complete be dmobility with mod - min A to sit EOB in prep for selfcare  OT Frequency: Min 2X/week   Barriers to D/C: Decreased caregiver support          Co-evaluation              AM-PAC PT "6 Clicks" Daily Activity     Outcome Measure Help from another person eating meals?: A Little Help from another person taking care of personal grooming?: A Little Help from another person toileting, which includes using toliet, bedpan, or urinal?: Total Help from another person bathing (including washing, rinsing, drying)?: Total Help from another person to put on and taking off regular upper body clothing?: A Lot Help from another person to put on and taking off regular lower body clothing?: Total 6 Click Score: 11   End of Session    Activity Tolerance: Patient limited by pain;Patient limited by fatigue;Other (comment)(anxious, fearful) Patient left: in bed;with call bell/phone within reach;with bed alarm set  OT Visit Diagnosis: Other abnormalities of gait and mobility (R26.89);Muscle weakness (generalized) (M62.81);Other symptoms and signs involving cognitive function;Pain;History  of falling (Z91.81) Pain - Right/Left: Left Pain - part of body: Hip                Time: 0981-1914 OT Time  Calculation (min): 26 min Charges:  OT General Charges $OT Visit: 1 Visit OT Evaluation $OT Eval Moderate Complexity: 1 Mod OT Treatments $Therapeutic Activity: 8-22 mins G-Codes: OT G-codes **NOT FOR INPATIENT CLASS** Functional Assessment Tool Used: AM-PAC 6 Clicks Daily Activity     Britt Bottom 03/09/2017, 2:39 PM

## 2017-03-09 NOTE — Progress Notes (Signed)
[facility] for Infectious Disease  Date of Admission:  03/04/2017             ASSESSMENT/PLAN  Left Hip Hemiarthroplasty Infection with MRSA - Now post-op day 4. Has remained afebrile with continued leukocytosis. Initial cultures positive for MRSA and now on Vancomycin. Repeat cultures from 3/2 with no growth x 2 days. Potential for repeat I&D if persistent leukocytosis and drainage.   1. Continue vancomcyin for MRSA infection and monitor cultures.  2. Will order PICC line for continued long term therapy  Therapeutic Drug Monitoring - Creatinine and kidney function stable with current dosage of vancomycin.   MRSA Bacteremia - Stable and currently receiving vancomycin with source likely hip infection. TTE is scheduled for today to rule out endocarditis. Repeat cultures from 3/2 remain with no growth to date.   1. Continue Vancomycin and monitor cultures.         Antimicrobial Management Team Staphylococcus aureus bacteremia   Staphylococcus aureus bacteremia (SAB) is associated with a high rate of complications and mortality.  Specific aspects of clinical management are critical to optimizing the outcome of patients with SAB.  Therefore, the [facility] Antimicrobial Management Team [facility]) has initiated an intervention aimed at improving the management of SAB at [facility].  To do so, Infectious Diseases physicians are providing an evidence-based consult for the management of all patients with SAB.     Yes No Comments  Perform follow-up blood cultures (even if the patient is afebrile) to ensure clearance of bacteremia [x]  []  Cultures from 3/2 with no growth to date  Remove vascular catheter and obtain follow-up blood cultures after the removal of the catheter []  [x]    Perform echocardiography to evaluate for endocarditis (transthoracic ECHO is 40-50% sensitive, TEE is > 90% sensitive) [x]  []  TTE pending for today   Consult electrophysiologist to evaluate implanted  cardiac device (pacemaker, ICD) []  [x]    Ensure source control [x]  []  03/01 I&D of left hip with possible repeat if needed.    Investigate for "metastatic" sites of infection [x]  []  None identified currently  Change antibiotic therapy to __________________ [x]  []  Currently on vancomycin  Estimated duration of IV antibiotic therapy:   [x]  []  Planning for 6-8 weeks of IV followed by 3-6 months of oral therapy     Principal Problem:   Wound infection after surgery Active Problems:   Leukocytosis   Chronic diastolic CHF (congestive heart failure) (HCC)   Acute renal failure superimposed on stage 3 chronic kidney disease (HCC)   Atrial fibrillation with normal ventricular rate (HCC)   CAD (coronary artery disease)   Anemia in chronic kidney disease (CKD)   Sepsis (HCC)   Acute delirium   . apixaban  5 mg Oral BID  . docusate sodium  100 mg Oral BID  . feeding supplement (ENSURE ENLIVE)  237 mL Oral BID BM  . finasteride  5 mg Oral Daily  . FLUoxetine  20 mg Oral Daily  . insulin aspart  0-9 Units Subcutaneous Q6H  . insulin glargine  10 Units Subcutaneous QHS  . metoprolol succinate  25 mg Oral Daily  . pantoprazole  40 mg Oral Daily  . senna-docusate  1 tablet Oral BID  . sodium chloride flush  3 mL Intravenous Q12H  . tamsulosin  0.4 mg Oral Daily    SUBJECTIVE:  Post-op day 4 from I&D of left hip with antibiotic bead placement. Afebrile overnight with continued leukocytosis up today to 21.6. Repeat blood  cultures from 3/2 with no growth to date. Per Dr. Lorin Mercy if leukocytosis or drainage persists may consider additional I&D. Current antibiotic regimen is vancomycin.   States he is not feeling very well today. Continues to have soreness located in his left hip.   No Known Allergies   Review of Systems: Review of Systems  Constitutional: Negative for chills, diaphoresis, fever, malaise/fatigue and weight loss.  Respiratory: Negative for cough and shortness of breath.     Cardiovascular: Negative for chest pain.  Gastrointestinal: Negative for abdominal pain, constipation, diarrhea and vomiting.  Skin: Negative for rash.  Neurological: Negative for weakness.      OBJECTIVE: Vitals:   03/08/17 0757 03/08/17 1813 03/08/17 2158 03/09/17 0433  BP: (!) 158/72 (!) 154/68 (!) 162/70 (!) 182/71  Pulse: 80 86 80 83  Resp: 17 16 17 16   Temp: 98.4 F (36.9 C) 98.5 F (36.9 C) 98.3 F (36.8 C) 98 F (36.7 C)  TempSrc: Oral Oral Oral Oral  SpO2: 99% 98% 98% 97%  Weight:      Height:       Body mass index is 26.83 kg/m.  Physical Exam  Constitutional: He is well-developed, well-nourished, and in no distress.  Seated in bed eating breakfast; slightly hard of hearing.   Neck: Neck supple.  Cardiovascular: Normal rate, regular rhythm, normal heart sounds and intact distal pulses. Exam reveals no gallop and no friction rub.  No murmur heard. Pulmonary/Chest: Effort normal and breath sounds normal. No respiratory distress. He has no wheezes. He has no rales. He exhibits no tenderness.  Musculoskeletal:  Left hip - Bandage in place and removed. There is moderate amount of serosanguinous drainage and tenderness around the surgical sites. Staples appear intact.   Lymphadenopathy:    He has no cervical adenopathy.  Neurological: He is alert.  Skin: Skin is warm and dry.    Lab Results Lab Results  Component Value Date   WBC 21.6 (H) 03/09/2017   HGB 8.9 (L) 03/09/2017   HCT 28.6 (L) 03/09/2017   MCV 81.7 03/09/2017   PLT 457 (H) 03/09/2017    Lab Results  Component Value Date   CREATININE 0.85 03/09/2017   BUN 10 03/09/2017   NA 134 (L) 03/09/2017   K 3.5 03/09/2017   CL 100 (L) 03/09/2017   CO2 26 03/09/2017    Lab Results  Component Value Date   ALT 10 (L) 03/06/2017   AST 13 (L) 03/06/2017   ALKPHOS 188 (H) 03/06/2017   BILITOT 1.2 03/06/2017     Microbiology: Recent Results (from the past 240 hour(s))  Urine culture     Status:  Abnormal   Collection Time: 03/04/17  5:44 AM  Result Value Ref Range Status   Specimen Description   Final    URINE, CATHETERIZED Performed at [facility], [address] [place], [place] [address]    Special Requests   Final    NONE Performed at [facility], [address], [place], [place] [address]    Culture (A)  Final    20,000 COLONIES/mL METHICILLIN RESISTANT STAPHYLOCOCCUS AUREUS   Report Status 03/06/2017 FINAL  Final   Organism ID, Bacteria METHICILLIN RESISTANT STAPHYLOCOCCUS AUREUS (A)  Final      Susceptibility   Methicillin resistant staphylococcus aureus - MIC*    CIPROFLOXACIN >=8 RESISTANT Resistant     GENTAMICIN >=16 RESISTANT Resistant     NITROFURANTOIN <=16 SENSITIVE Sensitive     OXACILLIN >=4 RESISTANT Resistant  TETRACYCLINE <=1 SENSITIVE Sensitive     VANCOMYCIN 1 SENSITIVE Sensitive     TRIMETH/SULFA <=10 SENSITIVE Sensitive     CLINDAMYCIN >=8 RESISTANT Resistant     RIFAMPIN <=0.5 SENSITIVE Sensitive     Inducible Clindamycin NEGATIVE Sensitive     * 20,000 COLONIES/mL METHICILLIN RESISTANT STAPHYLOCOCCUS AUREUS  Culture, blood (routine x 2)     Status: Abnormal   Collection Time: 03/04/17  6:03 AM  Result Value Ref Range Status   Specimen Description   Final    BLOOD RIGHT ARM Performed at [facility], [address], [place], [place] [address]    Special Requests   Final    BOTTLES DRAWN AEROBIC AND ANAEROBIC Blood Culture adequate volume Performed at [facility], [address], [place], [place] [address]    Culture  Setup Time   Final    GRAM POSITIVE COCCI ANAEROBIC BOTTLE ONLY Gram Stain Report Called to,Read Back By and Verified With: MURPHY,N @0545  ON 3.1.19 BY BOWMAN,L Performed at [facility], [address], [place], [place] [address]    Culture METHICILLIN RESISTANT STAPHYLOCOCCUS AUREUS (A)  Final   Report Status 03/07/2017 FINAL  Final   Organism ID, Bacteria METHICILLIN RESISTANT STAPHYLOCOCCUS AUREUS  Final       Susceptibility   Methicillin resistant staphylococcus aureus - MIC*    CIPROFLOXACIN >=8 RESISTANT Resistant     ERYTHROMYCIN >=8 RESISTANT Resistant     GENTAMICIN >=16 RESISTANT Resistant     OXACILLIN >=4 RESISTANT Resistant     TETRACYCLINE <=1 SENSITIVE Sensitive     VANCOMYCIN <=0.5 SENSITIVE Sensitive     TRIMETH/SULFA <=10 SENSITIVE Sensitive     CLINDAMYCIN >=8 RESISTANT Resistant     RIFAMPIN <=0.5 SENSITIVE Sensitive     Inducible Clindamycin NEGATIVE Sensitive     * METHICILLIN RESISTANT STAPHYLOCOCCUS AUREUS  Culture, blood (routine x 2)     Status: Abnormal   Collection Time: 03/04/17  6:05 AM  Result Value Ref Range Status   Specimen Description   Final    BLOOD RIGHT FOREARM DRAWN BY RN Performed at [facility], [address], [place], [place] [address]    Special Requests   Final    BOTTLES DRAWN AEROBIC AND ANAEROBIC Blood Culture adequate volume Performed at [facility], [address], [place], [place] [address]    Culture  Setup Time   Final    GRAM POSITIVE COCCI IN BOTH AEROBIC AND ANAEROBIC BOTTLES Gram Stain Report Called to,Read Back By and Verified With: MURPHY,N @ 4627 ON 3.1.19 BY BOWMAN,L    Culture (A)  Final    STAPHYLOCOCCUS SPECIES (COAGULASE NEGATIVE) THE SIGNIFICANCE OF ISOLATING THIS ORGANISM FROM A SINGLE SET OF BLOOD CULTURES WHEN MULTIPLE SETS ARE DRAWN IS UNCERTAIN. PLEASE NOTIFY THE MICROBIOLOGY DEPARTMENT WITHIN ONE WEEK IF SPECIATION AND SENSITIVITIES ARE REQUIRED. Performed at [facility], [place] [address] [place], [place] [address]    Report Status 03/07/2017 FINAL  Final  Blood Culture ID Panel (Reflexed)     Status: Abnormal   Collection Time: 03/04/17  6:05 AM  Result Value Ref Range Status   Enterococcus species NOT DETECTED NOT DETECTED Final   Listeria monocytogenes NOT DETECTED NOT DETECTED Final   Staphylococcus species DETECTED (A) NOT DETECTED Final    Comment: Methicillin (oxacillin) resistant coagulase negative  staphylococcus. Possible blood culture contaminant (unless isolated from more than one blood culture draw or clinical case suggests pathogenicity). No antibiotic treatment is indicated for blood  culture contaminants.  CRITICAL RESULT CALLED TO, READ BACK BY AND VERIFIED WITH: M. Radford Pax Pharm.D. 9:40 03/05/17 ([email or web address])    Staphylococcus aureus NOT DETECTED NOT DETECTED Final   Methicillin resistance DETECTED (A) NOT DETECTED Final    Comment: CRITICAL RESULT CALLED TO, READ BACK BY AND VERIFIED WITH: M.  Radford Pax Pharm.D. 9:40 03/05/17 ([email or web address])    Streptococcus species NOT DETECTED NOT DETECTED Final   Streptococcus agalactiae NOT DETECTED NOT DETECTED Final   Streptococcus pneumoniae NOT DETECTED NOT DETECTED Final   Streptococcus pyogenes NOT DETECTED NOT DETECTED Final   Acinetobacter baumannii NOT DETECTED NOT DETECTED Final   Enterobacteriaceae species NOT DETECTED NOT DETECTED Final   Enterobacter cloacae complex NOT DETECTED NOT DETECTED Final   Escherichia coli NOT DETECTED NOT DETECTED Final   Klebsiella oxytoca NOT DETECTED NOT DETECTED Final   Klebsiella pneumoniae NOT DETECTED NOT DETECTED Final   Proteus species NOT DETECTED NOT DETECTED Final   Serratia marcescens NOT DETECTED NOT DETECTED Final   Haemophilus influenzae NOT DETECTED NOT DETECTED Final   Neisseria meningitidis NOT DETECTED NOT DETECTED Final   Pseudomonas aeruginosa NOT DETECTED NOT DETECTED Final   Candida albicans NOT DETECTED NOT DETECTED Final   Candida glabrata NOT DETECTED NOT DETECTED Final   Candida krusei NOT DETECTED NOT DETECTED Final   Candida parapsilosis NOT DETECTED NOT DETECTED Final   Candida tropicalis NOT DETECTED NOT DETECTED Final    Comment: Performed at [facility], [place] [address], [place], [place] [address]  Aerobic Culture (superficial specimen)     Status: None   Collection Time: 03/04/17  8:48 AM  Result Value Ref Range Status   Specimen Description   Final    HIP  LEFT Performed at [facility], [address] [place], [place] [address]    Special Requests   Final    Immunocompromised Performed at [facility], [address], [place], [place] [address]    Gram Stain   Final    RARE WBC PRESENT, PREDOMINANTLY PMN FEW GRAM POSITIVE COCCI IN PAIRS    Culture   Final    ABUNDANT METHICILLIN RESISTANT STAPHYLOCOCCUS AUREUS ABUNDANT DIPHTHEROIDS(CORYNEBACTERIUM SPECIES) Standardized susceptibility testing for this organism is not available. Performed at [facility], [place] [address] [place], [place] [address]    Report Status 03/07/2017 FINAL  Final   Organism ID, Bacteria METHICILLIN RESISTANT STAPHYLOCOCCUS AUREUS  Final      Susceptibility   Methicillin resistant staphylococcus aureus - MIC*    CIPROFLOXACIN >=8 RESISTANT Resistant     ERYTHROMYCIN >=8 RESISTANT Resistant     GENTAMICIN >=16 RESISTANT Resistant     OXACILLIN >=4 RESISTANT Resistant     TETRACYCLINE <=1 SENSITIVE Sensitive     VANCOMYCIN <=0.5 SENSITIVE Sensitive     TRIMETH/SULFA <=10 SENSITIVE Sensitive     CLINDAMYCIN >=8 RESISTANT Resistant     RIFAMPIN <=0.5 SENSITIVE Sensitive     Inducible Clindamycin NEGATIVE Sensitive     * ABUNDANT METHICILLIN RESISTANT STAPHYLOCOCCUS AUREUS  Aerobic Culture (superficial specimen)     Status: None   Collection Time: 03/05/17 11:42 AM  Result Value Ref Range Status   Specimen Description WOUND LEFT HIP  Final   Special Requests PATIENT ON FOLLOWING ZOSYN GENTAMYCIN  Final   Gram Stain   Final    FEW WBC PRESENT,BOTH PMN AND MONONUCLEAR FEW GRAM POSITIVE COCCI IN CLUSTERS Performed at [facility], [place] [address] [place], [place] [address]    Culture   Final  MODERATE METHICILLIN RESISTANT STAPHYLOCOCCUS AUREUS   Report Status 03/07/2017 FINAL  Final   Organism ID, Bacteria METHICILLIN RESISTANT STAPHYLOCOCCUS AUREUS  Final      Susceptibility   Methicillin resistant staphylococcus aureus - MIC*     CIPROFLOXACIN >=8 RESISTANT Resistant     ERYTHROMYCIN >=8 RESISTANT Resistant     GENTAMICIN <=0.5 SENSITIVE Sensitive     OXACILLIN >=4 RESISTANT Resistant     TETRACYCLINE <=1 SENSITIVE Sensitive     VANCOMYCIN 1 SENSITIVE Sensitive     TRIMETH/SULFA <=10 SENSITIVE Sensitive     CLINDAMYCIN >=8 RESISTANT Resistant     RIFAMPIN <=0.5 SENSITIVE Sensitive     Inducible Clindamycin NEGATIVE Sensitive     * MODERATE METHICILLIN RESISTANT STAPHYLOCOCCUS AUREUS  Culture, blood (routine x 2)     Status: None (Preliminary result)   Collection Time: 03/06/17  6:32 AM  Result Value Ref Range Status   Specimen Description BLOOD LEFT ANTECUBITAL  Final   Special Requests IN PEDIATRIC BOTTLE Blood Culture adequate volume  Final   Culture   Final    NO GROWTH 2 DAYS Performed at [facility], [place] [address], [place], [place] [address]    Report Status PENDING  Incomplete  Culture, blood (routine x 2)     Status: None (Preliminary result)   Collection Time: 03/06/17  6:38 AM  Result Value Ref Range Status   Specimen Description BLOOD LEFT ARM  Final   Special Requests   Final    IN PEDIATRIC BOTTLE Blood Culture results may not be optimal due to an excessive volume of blood received in culture bottles   Culture   Final    NO GROWTH 2 DAYS Performed at [facility], [name] [address] [place],  [address]    Report Status PENDING  Incomplete     Terri Piedra, [place] for [place] Pager  03/09/2017  8:59 AM

## 2017-03-10 ENCOUNTER — Encounter (HOSPITAL_COMMUNITY)
Admission: EM | Disposition: A | Discharge status: Skilled Nursing Facility | Payer: Self-pay | Source: Home / Self Care | Attending: Internal Medicine

## 2017-03-10 ENCOUNTER — Inpatient Hospital Stay: Payer: Self-pay

## 2017-03-10 ENCOUNTER — Inpatient Hospital Stay (HOSPITAL_COMMUNITY): Payer: Medicare Other

## 2017-03-10 ENCOUNTER — Inpatient Hospital Stay (HOSPITAL_COMMUNITY): Payer: Medicare Other | Admitting: Certified Registered"

## 2017-03-10 ENCOUNTER — Encounter (HOSPITAL_COMMUNITY): Payer: Self-pay | Admitting: Surgery

## 2017-03-10 DIAGNOSIS — Z89512 Acquired absence of left leg below knee: Secondary | ICD-10-CM

## 2017-03-10 DIAGNOSIS — Z96641 Presence of right artificial hip joint: Secondary | ICD-10-CM

## 2017-03-10 DIAGNOSIS — T8451XA Infection and inflammatory reaction due to internal right hip prosthesis, initial encounter: Secondary | ICD-10-CM

## 2017-03-10 DIAGNOSIS — B9562 Methicillin resistant Staphylococcus aureus infection as the cause of diseases classified elsewhere: Secondary | ICD-10-CM

## 2017-03-10 HISTORY — PX: INCISION AND DRAINAGE HIP: SHX1801

## 2017-03-10 LAB — BASIC METABOLIC PANEL
ANION GAP: 8 (ref 5–15)
Anion gap: 7 (ref 5–15)
BUN: 15 mg/dL (ref 6–20)
BUN: 13 mg/dL (ref 6–20)
CALCIUM: 10.8 mg/dL — AB (ref 8.9–10.3)
CHLORIDE: 97 mmol/L — AB (ref 101–111)
CO2: 29 mmol/L (ref 22–32)
CO2: 29 mmol/L (ref 22–32)
CREATININE: 0.92 mg/dL (ref 0.61–1.24)
CREATININE: 0.87 mg/dL (ref 0.61–1.24)
Calcium: 10.7 mg/dL — ABNORMAL HIGH (ref 8.9–10.3)
Chloride: 98 mmol/L — ABNORMAL LOW (ref 101–111)
GFR calc non Af Amer: >60 mL/min (ref >60)
Glucose, Bld: 199 mg/dL — ABNORMAL HIGH (ref 65–99)
Glucose, Bld: 196 mg/dL — ABNORMAL HIGH (ref 65–99)
POTASSIUM: 3.4 mmol/L — AB (ref 3.5–5.1)
Potassium: 3.5 mmol/L (ref 3.5–5.1)
SODIUM: 134 mmol/L — AB (ref 135–145)
SODIUM: 134 mmol/L — AB (ref 135–145)

## 2017-03-10 LAB — CBC WITH DIFFERENTIAL/PLATELET
BASOS PCT: 0 %
Basophils Absolute: 0 10*3/uL (ref 0.0–0.1)
EOS ABS: 0.2 10*3/uL (ref 0.0–0.7)
Eosinophils Relative: 1 %
HCT: 28.4 % — ABNORMAL LOW (ref 39.0–52.0)
Hemoglobin: 8.7 g/dL — ABNORMAL LOW (ref 13.0–17.0)
LYMPHS ABS: 1.4 10*3/uL (ref 0.7–4.0)
Lymphocytes Relative: 7 %
MCH: 25 pg — AB (ref 26.0–34.0)
MCHC: 30.6 g/dL (ref 30.0–36.0)
MCV: 81.6 fL (ref 78.0–100.0)
MONOS PCT: 2 %
Monocytes Absolute: 0.5 10*3/uL (ref 0.1–1.0)
NEUTROS PCT: 90 %
Neutro Abs: 17.3 10*3/uL — ABNORMAL HIGH (ref 1.7–7.7)
Platelets: 442 10*3/uL — ABNORMAL HIGH (ref 150–400)
RBC: 3.48 MIL/uL — ABNORMAL LOW (ref 4.22–5.81)
RDW: 16.4 % — AB (ref 11.5–15.5)
WBC: 19.4 10*3/uL — AB (ref 4.0–10.5)

## 2017-03-10 LAB — GLUCOSE, CAPILLARY
GLUCOSE-CAPILLARY: 222 mg/dL — AB (ref 65–99)
GLUCOSE-CAPILLARY: 201 mg/dL — AB (ref 65–99)
GLUCOSE-CAPILLARY: 133 mg/dL — AB (ref 65–99)
Glucose-Capillary: 154 mg/dL — ABNORMAL HIGH (ref 65–99)

## 2017-03-10 LAB — VANCOMYCIN, TROUGH: Vancomycin Tr: 27 ug/mL (ref 15–20)

## 2017-03-10 SURGERY — IRRIGATION AND DEBRIDEMENT HIP
Anesthesia: General | Site: Hip | Laterality: Left

## 2017-03-10 MED ORDER — PROPOFOL 10 MG/ML IV BOLUS
INTRAVENOUS | Status: AC
  Filled 2017-03-10: qty 20

## 2017-03-10 MED ORDER — FENTANYL CITRATE (PF) 250 MCG/5ML IJ SOLN
INTRAMUSCULAR | Status: AC
  Filled 2017-03-10: qty 5

## 2017-03-10 MED ORDER — FENTANYL CITRATE (PF) 100 MCG/2ML IJ SOLN
25.0000 ug | INTRAMUSCULAR | Status: DC | PRN
  Administered 2017-03-10: 50 ug via INTRAVENOUS
  Administered 2017-03-10 (×2): 25 ug via INTRAVENOUS

## 2017-03-10 MED ORDER — VANCOMYCIN HCL 1000 MG IV SOLR
INTRAVENOUS | Status: AC
  Filled 2017-03-10: qty 1000

## 2017-03-10 MED ORDER — SODIUM CHLORIDE 0.9 % IR SOLN
Status: DC | PRN
  Administered 2017-03-10: 1000 mL
  Administered 2017-03-10: 3000 mL

## 2017-03-10 MED ORDER — GENTAMICIN SULFATE 40 MG/ML IJ SOLN
INTRAMUSCULAR | Status: AC
  Filled 2017-03-10: qty 2

## 2017-03-10 MED ORDER — ROCURONIUM BROMIDE 100 MG/10ML IV SOLN
INTRAVENOUS | Status: DC | PRN
  Administered 2017-03-10: 30 mg via INTRAVENOUS

## 2017-03-10 MED ORDER — GENTAMICIN SULFATE 40 MG/ML IJ SOLN
INTRAMUSCULAR | Status: AC
  Filled 2017-03-10: qty 4

## 2017-03-10 MED ORDER — LIDOCAINE HCL (CARDIAC) 20 MG/ML IV SOLN
INTRAVENOUS | Status: DC | PRN
  Administered 2017-03-10: 60 mg via INTRAVENOUS

## 2017-03-10 MED ORDER — SODIUM CHLORIDE 0.45 % IV SOLN
INTRAVENOUS | Status: DC
  Administered 2017-03-10 – 2017-03-11 (×2): via INTRAVENOUS

## 2017-03-10 MED ORDER — METOCLOPRAMIDE HCL 5 MG PO TABS
5.0000 mg | ORAL_TABLET | Freq: Three times a day (TID) | ORAL | Status: DC | PRN
  Administered 2017-03-11: 10 mg via ORAL
  Filled 2017-03-10: qty 2

## 2017-03-10 MED ORDER — VANCOMYCIN HCL IN DEXTROSE 1-5 GM/200ML-% IV SOLN
1000.0000 mg | INTRAVENOUS | Status: DC
  Administered 2017-03-10 – 2017-03-12 (×3): 1000 mg via INTRAVENOUS
  Filled 2017-03-10 (×4): qty 200

## 2017-03-10 MED ORDER — FENTANYL CITRATE (PF) 100 MCG/2ML IJ SOLN
INTRAMUSCULAR | Status: DC | PRN
  Administered 2017-03-10: 50 ug via INTRAVENOUS

## 2017-03-10 MED ORDER — SODIUM CHLORIDE 0.9% FLUSH
10.0000 mL | INTRAVENOUS | Status: DC | PRN
  Administered 2017-03-12: 20 mL
  Administered 2017-03-17: 10 mL
  Filled 2017-03-10 (×2): qty 40

## 2017-03-10 MED ORDER — METOCLOPRAMIDE HCL 5 MG/ML IJ SOLN: 5.0000 mg | Freq: Three times a day (TID) | INTRAMUSCULAR | Status: DC | PRN

## 2017-03-10 MED ORDER — GENTAMICIN SULFATE 40 MG/ML IJ SOLN
INTRAMUSCULAR | Status: DC | PRN
  Administered 2017-03-10: 240 mg

## 2017-03-10 MED ORDER — DOCUSATE SODIUM 100 MG PO CAPS
100.0000 mg | ORAL_CAPSULE | Freq: Two times a day (BID) | ORAL | Status: DC
  Administered 2017-03-10 – 2017-03-17 (×14): 100 mg via ORAL
  Filled 2017-03-10 (×14): qty 1

## 2017-03-10 MED ORDER — LACTATED RINGERS IV SOLN
INTRAVENOUS | Status: DC
  Administered 2017-03-10: 16:00:00 via INTRAVENOUS

## 2017-03-10 MED ORDER — SUGAMMADEX SODIUM 200 MG/2ML IV SOLN
INTRAVENOUS | Status: DC | PRN
  Administered 2017-03-10: 160 mg via INTRAVENOUS

## 2017-03-10 MED ORDER — CEFAZOLIN SODIUM-DEXTROSE 2-4 GM/100ML-% IV SOLN
INTRAVENOUS | Status: AC
  Filled 2017-03-10: qty 100

## 2017-03-10 MED ORDER — CHLORHEXIDINE GLUCONATE 4 % EX LIQD
60.0000 mL | Freq: Once | CUTANEOUS | Status: AC
  Administered 2017-03-10: 4 via TOPICAL
  Filled 2017-03-10 (×3): qty 60

## 2017-03-10 MED ORDER — PROPOFOL 10 MG/ML IV BOLUS
INTRAVENOUS | Status: DC | PRN
  Administered 2017-03-10: 50 mg via INTRAVENOUS

## 2017-03-10 MED ORDER — VANCOMYCIN HCL 1000 MG IV SOLR
INTRAVENOUS | Status: DC | PRN
  Administered 2017-03-10: 1000 mg

## 2017-03-10 MED ORDER — ONDANSETRON HCL 4 MG/2ML IJ SOLN
INTRAMUSCULAR | Status: DC | PRN
  Administered 2017-03-10: 4 mg via INTRAVENOUS

## 2017-03-10 MED ORDER — ROCURONIUM BROMIDE 50 MG/5ML IV SOLN
INTRAVENOUS | Status: AC
  Filled 2017-03-10: qty 1

## 2017-03-10 MED ORDER — HYDRALAZINE HCL 20 MG/ML IJ SOLN
10.0000 mg | Freq: Once | INTRAMUSCULAR | Status: AC
  Administered 2017-03-10: 10 mg via INTRAVENOUS
  Filled 2017-03-10: qty 1

## 2017-03-10 MED ORDER — FENTANYL CITRATE (PF) 100 MCG/2ML IJ SOLN
INTRAMUSCULAR | Status: AC
  Filled 2017-03-10: qty 2

## 2017-03-10 SURGICAL SUPPLY — 50 items
BOWL SMART MIX CTS (DISPOSABLE) ×3 IMPLANT
CANISTER WOUNDNEG PRESSURE 500 (CANNISTER) ×3 IMPLANT
COVER SURGICAL LIGHT HANDLE (MISCELLANEOUS) ×3 IMPLANT
DRAPE IMP U-DRAPE 54X76 (DRAPES) ×3 IMPLANT
DRAPE ORTHO SPLIT 77X108 STRL (DRAPES) ×4
DRAPE SURG ORHT 6 SPLT 77X108 (DRAPES) ×2 IMPLANT
DRAPE U-SHAPE 47X51 STRL (DRAPES) ×3 IMPLANT
DRESSING ALLEVYN LIFE SACRUM (GAUZE/BANDAGES/DRESSINGS) ×3 IMPLANT
DRSG PAD ABDOMINAL 8X10 ST (GAUZE/BANDAGES/DRESSINGS) IMPLANT
DRSG VAC ATS SM SENSATRAC (GAUZE/BANDAGES/DRESSINGS) IMPLANT
DURAPREP 26ML APPLICATOR (WOUND CARE) ×3 IMPLANT
ELECT CAUTERY BLADE 6.4 (BLADE) IMPLANT
ELECT REM PT RETURN 9FT ADLT (ELECTROSURGICAL) ×3
ELECTRODE REM PT RTRN 9FT ADLT (ELECTROSURGICAL) ×1 IMPLANT
GAUZE SPONGE 4X4 12PLY STRL (GAUZE/BANDAGES/DRESSINGS) IMPLANT
GAUZE XEROFORM 5X9 LF (GAUZE/BANDAGES/DRESSINGS) IMPLANT
GLOVE BIOGEL PI IND STRL 8 (GLOVE) ×1 IMPLANT
GLOVE BIOGEL PI INDICATOR 8 (GLOVE) ×2
GLOVE ORTHO TXT STRL SZ7.5 (GLOVE) ×3 IMPLANT
GOWN STRL REUS W/ TWL LRG LVL3 (GOWN DISPOSABLE) ×1 IMPLANT
GOWN STRL REUS W/ TWL XL LVL3 (GOWN DISPOSABLE) ×1 IMPLANT
GOWN STRL REUS W/TWL 2XL LVL3 (GOWN DISPOSABLE) ×3 IMPLANT
GOWN STRL REUS W/TWL LRG LVL3 (GOWN DISPOSABLE) ×2
GOWN STRL REUS W/TWL XL LVL3 (GOWN DISPOSABLE) ×2
HANDPIECE INTERPULSE COAX TIP (DISPOSABLE) ×2
KIT BASIN OR (CUSTOM PROCEDURE TRAY) ×3 IMPLANT
KIT PREVENA INCISION MGT20CM45 (CANNISTER) ×3 IMPLANT
KIT ROOM TURNOVER OR (KITS) ×3 IMPLANT
KIT STIMULAN RAPID CURE  10CC (Orthopedic Implant) ×2 IMPLANT
KIT STIMULAN RAPID CURE 10CC (Orthopedic Implant) ×1 IMPLANT
MANIFOLD NEPTUNE II (INSTRUMENTS) ×3 IMPLANT
NS IRRIG 1000ML POUR BTL (IV SOLUTION) ×3 IMPLANT
PACK TOTAL JOINT (CUSTOM PROCEDURE TRAY) ×3 IMPLANT
PACK UNIVERSAL I (CUSTOM PROCEDURE TRAY) ×3 IMPLANT
PAD ARMBOARD 7.5X6 YLW CONV (MISCELLANEOUS) ×3 IMPLANT
SET HNDPC FAN SPRY TIP SCT (DISPOSABLE) ×1 IMPLANT
SPONGE LAP 18X18 X RAY DECT (DISPOSABLE) ×3 IMPLANT
STAPLER VISISTAT 35W (STAPLE) IMPLANT
SUT ETHILON 1 TP 1 60 (SUTURE) ×3 IMPLANT
SUT PDS AB 1 CT  36 (SUTURE)
SUT PDS AB 1 CT 36 (SUTURE) IMPLANT
SUT VIC AB 0 CT1 27 (SUTURE)
SUT VIC AB 0 CT1 27XBRD ANBCTR (SUTURE) IMPLANT
SUT VIC AB 2-0 CT1 27 (SUTURE)
SUT VIC AB 2-0 CT1 TAPERPNT 27 (SUTURE) IMPLANT
SWAB CULTURE ESWAB REG 1ML (MISCELLANEOUS) IMPLANT
TOWEL OR 17X24 6PK STRL BLUE (TOWEL DISPOSABLE) ×3 IMPLANT
TOWEL OR 17X26 10 PK STRL BLUE (TOWEL DISPOSABLE) IMPLANT
UNDERPAD 30X30 (UNDERPADS AND DIAPERS) ×3 IMPLANT
WATER STERILE IRR 1000ML POUR (IV SOLUTION) IMPLANT

## 2017-03-10 NOTE — Anesthesia Procedure Notes (Signed)
Procedure Name: Intubation Date/Time: 03/10/2017 5:18 PM Performed by: Lance Coon, CRNA Pre-anesthesia Checklist: Patient identified, Emergency Drugs available, Suction available, Patient being monitored and Timeout performed Patient Re-evaluated:Patient Re-evaluated prior to induction Oxygen Delivery Method: Circle system utilized Preoxygenation: Pre-oxygenation with 100% oxygen Induction Type: IV induction Ventilation: Mask ventilation without difficulty and Oral airway inserted - appropriate to patient size Laryngoscope Size: Miller and 3 Grade View: Grade I Tube type: Oral Tube size: 7.5 mm Number of attempts: 1 Airway Equipment and Method: Stylet Placement Confirmation: ETT inserted through vocal cords under direct vision,  positive ETCO2 and breath sounds checked- equal and bilateral Secured at: 21 cm Tube secured with: Tape Dental Injury: Teeth and Oropharynx as per pre-operative assessment

## 2017-03-10 NOTE — Progress Notes (Signed)
Pharmacy Antibiotic Note  [name] is a 84 y.o. male admitted on 03/04/2017 with confusion/hip pain from surgical site from hip surgery done on 2/20. Pharmacy is consulted for Vancomycin dosing.  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 S/p I&D and placement of abx beads on 3/1. Not candidate for rifampin. Planning 6 weeks of IV abx followed by long term oral suppression. Afeb, WBC trending down but still high at 21.6.[name]  Plan: Decrease vancomycin to 1g IV Q24h and at noon Monitor clinical picture, renal function, VT at new Css F/U C&S, abx deescalation / LOT  Height: 5\' 7"  (170.2 cm) Weight: 171 lb 4.8 oz (77.7 kg) IBW/kg (Calculated) : 66.1  Temp (24hrs), Avg:98 F (36.7 C), Min:97.9 F (36.6 C), Max:98.2 F (36.8 C)  Recent Labs  Lab 03/04/17 0602 03/04/17 1287 03/04/17 0840 03/06/17 0622 03/07/17 0522 03/08/17 0402 03/08/17 1545 03/09/17 0747 03/10/17 0541  WBC 34.0*  --   --  24.2* 20.4*  --  19.2* 21.6*  --   CREATININE 1.41*  --   --  1.06 0.98 0.87  --  0.85 0.92  LATICACIDVEN  --  2.43* 1.0  --   --   --   --   --   --   VANCOTROUGH  --   --   --   --   --   --   --   --  27*    Estimated Creatinine Clearance: 56.9 mL/min (by C-G formula based on SCr of 0.92 mg/dL).    No Known Allergies  Antimicrobials this admission: Vancomycin 2/28 >> Zosyn 2/28 >> 3/1  Dose adjustments this admission: 3/4: Empiric change of vanc from 1g Q24 to 750mg  Q12  3/6 VT = 27 (Decrease dose back down to 1g Q24  Microbiology results: 2/28 BCx >> 3/4 MRSE 3/1 WCx (intra-op) >>MRSA 3/2 BCx >> ngtd  Thank you for allowing pharmacy to participate in this patients care.  Elenor Quinones, PharmD, BCPS Clinical Pharmacist Pager [phone] 03/10/2017 7:54 AM

## 2017-03-10 NOTE — Progress Notes (Signed)
[facility]  Date of Admission:  03/04/2017             ASSESSMENT/PLAN  Left Hip Hemiarthroplasty Infection with MRSA - Continues to have significant drainage from the left hip. Plan for washout with new antibiotic beads and wound vac placement today.   1. Continue Vancomycin for MRSA infection.   MRSA Bacteremia - Repeat blood cultures from 3/2 remain with no growth. Continue to monitor cultures.   1. Continue Vancomycin.   Therapeutic Drug Monitoring - Creatinine stable. Continue to monitor while on vancomycin. Vancomycin trough noted to be elevated - changes per pharmacy protocol.    Principal Problem:   Wound infection after surgery Active Problems:   Leukocytosis   Chronic diastolic CHF (congestive heart failure) (HCC)   Acute renal failure superimposed on stage 3 chronic kidney disease (HCC)   Atrial fibrillation with normal ventricular rate (HCC)   CAD (coronary artery disease)   Anemia in chronic kidney disease (CKD)   Sepsis (HCC)   Acute delirium   . apixaban  5 mg Oral BID  . docusate sodium  100 mg Oral BID  . feeding supplement (ENSURE ENLIVE)  237 mL Oral BID BM  . finasteride  5 mg Oral Daily  . FLUoxetine  20 mg Oral Daily  . insulin aspart  0-15 Units Subcutaneous TID WC  . insulin glargine  10 Units Subcutaneous QHS  . metoprolol succinate  25 mg Oral Daily  . pantoprazole  40 mg Oral Daily  . senna-docusate  1 tablet Oral BID  . sodium chloride flush  3 mL Intravenous Q12H  . tamsulosin  0.4 mg Oral Daily    SUBJECTIVE:  Afebrile overnight. Plan for repeat washout and placement of new antibiotics beads and wound vac this afternoon.   No Known Allergies   Review of Systems: Review of Systems  Constitutional: Negative for chills and fever.  Respiratory: Negative for cough and shortness of breath.   Cardiovascular: Negative for chest pain.  Gastrointestinal: Negative for constipation, diarrhea, nausea and  vomiting.  Genitourinary: Negative for dysuria, flank pain, frequency and urgency.  Skin: Negative for rash.  Neurological: Negative for weakness.      OBJECTIVE: Vitals:   03/09/17 0900 03/09/17 1812 03/09/17 2146 03/10/17 0411  BP: (!) 180/74 (!) 172/78 (!) 172/89 (!) 170/74  Pulse: 82 77 77 83  Resp: 18 18 18 18   Temp: 98.2 F (36.8 C) 98 F (36.7 C) 97.9 F (36.6 C) 98 F (36.7 C)  TempSrc: Oral Oral Oral Oral  SpO2: 98% 99% 100% 98%  Weight:      Height:       Body mass index is 26.83 kg/m.  Physical Exam  Constitutional: No distress.  Cardiovascular: Normal rate, regular rhythm, normal heart sounds and intact distal pulses. Exam reveals no gallop and no friction rub.  No murmur heard. Left BKA  Pulmonary/Chest: Effort normal and breath sounds normal. No respiratory distress. He has no wheezes. He has no rales. He exhibits no tenderness.  Musculoskeletal:  Left hip - BKA noted. Left hip dressing is intact and marked with drainage. Continues to have tenderness over the area. No lymphadenopathy noted.   Neurological: He is alert.  Skin: Skin is warm and dry. No rash noted.    Lab Results Lab Results  Component Value Date   WBC 21.6 (H) 03/09/2017   HGB 8.9 (L) 03/09/2017   HCT 28.6 (L) 03/09/2017   MCV 81.7 03/09/2017  PLT 457 (H) 03/09/2017    Lab Results  Component Value Date   CREATININE 0.92 03/10/2017   BUN 15 03/10/2017   NA 134 (L) 03/10/2017   K 3.4 (L) 03/10/2017   CL 97 (L) 03/10/2017   CO2 29 03/10/2017    Lab Results  Component Value Date   ALT 10 (L) 03/06/2017   AST 13 (L) 03/06/2017   ALKPHOS 188 (H) 03/06/2017   BILITOT 1.2 03/06/2017     Microbiology: Recent Results (from the past 240 hour(s))  Urine culture     Status: Abnormal   Collection Time: 03/04/17  5:44 AM  Result Value Ref Range Status   Specimen Description   Final    URINE, CATHETERIZED Performed at [facility], [address], [place], [place] [address]     Special Requests   Final    NONE Performed at [facility], [address] [place], [place] [address]    Culture (A)  Final    20,000 COLONIES/mL METHICILLIN RESISTANT STAPHYLOCOCCUS AUREUS   Report Status 03/06/2017 FINAL  Final   Organism ID, Bacteria METHICILLIN RESISTANT STAPHYLOCOCCUS AUREUS (A)  Final      Susceptibility   Methicillin resistant staphylococcus aureus - MIC*    CIPROFLOXACIN >=8 RESISTANT Resistant     GENTAMICIN >=16 RESISTANT Resistant     NITROFURANTOIN <=16 SENSITIVE Sensitive     OXACILLIN >=4 RESISTANT Resistant     TETRACYCLINE <=1 SENSITIVE Sensitive     VANCOMYCIN 1 SENSITIVE Sensitive     TRIMETH/SULFA <=10 SENSITIVE Sensitive     CLINDAMYCIN >=8 RESISTANT Resistant     RIFAMPIN <=0.5 SENSITIVE Sensitive     Inducible Clindamycin NEGATIVE Sensitive     * 20,000 COLONIES/mL METHICILLIN RESISTANT STAPHYLOCOCCUS AUREUS  Culture, blood (routine x 2)     Status: Abnormal   Collection Time: 03/04/17  6:03 AM  Result Value Ref Range Status   Specimen Description   Final    BLOOD RIGHT ARM Performed at [facility], [address], [place], [place] [address]    Special Requests   Final    BOTTLES DRAWN AEROBIC AND ANAEROBIC Blood Culture adequate volume Performed at [facility], [address] [place], [place] [address]    Culture  Setup Time   Final    GRAM POSITIVE COCCI ANAEROBIC BOTTLE ONLY Gram Stain Report Called to,Read Back By and Verified With: MURPHY,N @0545  ON 3.1.19 BY BOWMAN,L Performed at [facility], [address] [place], [place] [address]    Culture METHICILLIN RESISTANT STAPHYLOCOCCUS AUREUS (A)  Final   Report Status 03/07/2017 FINAL  Final   Organism ID, Bacteria METHICILLIN RESISTANT STAPHYLOCOCCUS AUREUS  Final      Susceptibility   Methicillin resistant staphylococcus aureus - MIC*    CIPROFLOXACIN >=8 RESISTANT Resistant     ERYTHROMYCIN >=8 RESISTANT Resistant     GENTAMICIN >=16 RESISTANT Resistant     OXACILLIN >=4  RESISTANT Resistant     TETRACYCLINE <=1 SENSITIVE Sensitive     VANCOMYCIN <=0.5 SENSITIVE Sensitive     TRIMETH/SULFA <=10 SENSITIVE Sensitive     CLINDAMYCIN >=8 RESISTANT Resistant     RIFAMPIN <=0.5 SENSITIVE Sensitive     Inducible Clindamycin NEGATIVE Sensitive     * METHICILLIN RESISTANT STAPHYLOCOCCUS AUREUS  Culture, blood (routine x 2)     Status: Abnormal   Collection Time: 03/04/17  6:05 AM  Result Value Ref Range Status   Specimen Description   Final    BLOOD RIGHT FOREARM DRAWN  BY RN Performed at [facility], [address], [place], [place] [address]    Special Requests   Final    BOTTLES DRAWN AEROBIC AND ANAEROBIC Blood Culture adequate volume Performed at [facility], [address] [place], [place] [address]    Culture  Setup Time   Final    GRAM POSITIVE COCCI IN BOTH AEROBIC AND ANAEROBIC BOTTLES Gram Stain Report Called to,Read Back By and Verified With: MURPHY,N @ 3762 ON 3.1.19 BY BOWMAN,L    Culture (A)  Final    STAPHYLOCOCCUS SPECIES (COAGULASE NEGATIVE) THE SIGNIFICANCE OF ISOLATING THIS ORGANISM FROM A SINGLE SET OF BLOOD CULTURES WHEN MULTIPLE SETS ARE DRAWN IS UNCERTAIN. PLEASE NOTIFY THE MICROBIOLOGY DEPARTMENT WITHIN ONE WEEK IF SPECIATION AND SENSITIVITIES ARE REQUIRED. Performed at [facility], [place] [address], [place], [place] [address]    Report Status 03/07/2017 FINAL  Final  Blood Culture ID Panel (Reflexed)     Status: Abnormal   Collection Time: 03/04/17  6:05 AM  Result Value Ref Range Status   Enterococcus species NOT DETECTED NOT DETECTED Final   Listeria monocytogenes NOT DETECTED NOT DETECTED Final   Staphylococcus species DETECTED (A) NOT DETECTED Final    Comment: Methicillin (oxacillin) resistant coagulase negative staphylococcus. Possible blood culture contaminant (unless isolated from more than one blood culture draw or clinical case suggests pathogenicity). No antibiotic treatment is indicated for blood  culture  contaminants. CRITICAL RESULT CALLED TO, READ BACK BY AND VERIFIED WITH: M. Radford Pax Pharm.D. 9:40 03/05/17 ([email or web address])    Staphylococcus aureus NOT DETECTED NOT DETECTED Final   Methicillin resistance DETECTED (A) NOT DETECTED Final    Comment: CRITICAL RESULT CALLED TO, READ BACK BY AND VERIFIED WITH: M.  Radford Pax Pharm.D. 9:40 03/05/17 ([email or web address])    Streptococcus species NOT DETECTED NOT DETECTED Final   Streptococcus agalactiae NOT DETECTED NOT DETECTED Final   Streptococcus pneumoniae NOT DETECTED NOT DETECTED Final   Streptococcus pyogenes NOT DETECTED NOT DETECTED Final   Acinetobacter baumannii NOT DETECTED NOT DETECTED Final   Enterobacteriaceae species NOT DETECTED NOT DETECTED Final   Enterobacter cloacae complex NOT DETECTED NOT DETECTED Final   Escherichia coli NOT DETECTED NOT DETECTED Final   Klebsiella oxytoca NOT DETECTED NOT DETECTED Final   Klebsiella pneumoniae NOT DETECTED NOT DETECTED Final   Proteus species NOT DETECTED NOT DETECTED Final   Serratia marcescens NOT DETECTED NOT DETECTED Final   Haemophilus influenzae NOT DETECTED NOT DETECTED Final   Neisseria meningitidis NOT DETECTED NOT DETECTED Final   Pseudomonas aeruginosa NOT DETECTED NOT DETECTED Final   Candida albicans NOT DETECTED NOT DETECTED Final   Candida glabrata NOT DETECTED NOT DETECTED Final   Candida krusei NOT DETECTED NOT DETECTED Final   Candida parapsilosis NOT DETECTED NOT DETECTED Final   Candida tropicalis NOT DETECTED NOT DETECTED Final    Comment: Performed at [facility], [place] [address] [place], [place] [address]  Aerobic Culture (superficial specimen)     Status: None   Collection Time: 03/04/17  8:48 AM  Result Value Ref Range Status   Specimen Description   Final    HIP LEFT Performed at [facility], [address], [place], [place] [address]    Special Requests   Final    Immunocompromised Performed at [facility], [address] [place], [place] [address]    Gram Stain    Final    RARE WBC PRESENT, PREDOMINANTLY PMN FEW GRAM POSITIVE COCCI IN PAIRS    Culture  Final    ABUNDANT METHICILLIN RESISTANT STAPHYLOCOCCUS AUREUS ABUNDANT DIPHTHEROIDS(CORYNEBACTERIUM SPECIES) Standardized susceptibility testing for this organism is not available. Performed at [facility], [place] [address] [place], [place] [address]    Report Status 03/07/2017 FINAL  Final   Organism ID, Bacteria METHICILLIN RESISTANT STAPHYLOCOCCUS AUREUS  Final      Susceptibility   Methicillin resistant staphylococcus aureus - MIC*    CIPROFLOXACIN >=8 RESISTANT Resistant     ERYTHROMYCIN >=8 RESISTANT Resistant     GENTAMICIN >=16 RESISTANT Resistant     OXACILLIN >=4 RESISTANT Resistant     TETRACYCLINE <=1 SENSITIVE Sensitive     VANCOMYCIN <=0.5 SENSITIVE Sensitive     TRIMETH/SULFA <=10 SENSITIVE Sensitive     CLINDAMYCIN >=8 RESISTANT Resistant     RIFAMPIN <=0.5 SENSITIVE Sensitive     Inducible Clindamycin NEGATIVE Sensitive     * ABUNDANT METHICILLIN RESISTANT STAPHYLOCOCCUS AUREUS  Aerobic Culture (superficial specimen)     Status: None   Collection Time: 03/05/17 11:42 AM  Result Value Ref Range Status   Specimen Description WOUND LEFT HIP  Final   Special Requests PATIENT ON FOLLOWING ZOSYN GENTAMYCIN  Final   Gram Stain   Final    FEW WBC PRESENT,BOTH PMN AND MONONUCLEAR FEW GRAM POSITIVE COCCI IN CLUSTERS Performed at [facility], [place] [address], [place], [place] [address]    Culture   Final    MODERATE METHICILLIN RESISTANT STAPHYLOCOCCUS AUREUS   Report Status 03/07/2017 FINAL  Final   Organism ID, Bacteria METHICILLIN RESISTANT STAPHYLOCOCCUS AUREUS  Final      Susceptibility   Methicillin resistant staphylococcus aureus - MIC*    CIPROFLOXACIN >=8 RESISTANT Resistant     ERYTHROMYCIN >=8 RESISTANT Resistant     GENTAMICIN <=0.5 SENSITIVE Sensitive     OXACILLIN >=4 RESISTANT Resistant     TETRACYCLINE <=1 SENSITIVE Sensitive     VANCOMYCIN 1  SENSITIVE Sensitive     TRIMETH/SULFA <=10 SENSITIVE Sensitive     CLINDAMYCIN >=8 RESISTANT Resistant     RIFAMPIN <=0.5 SENSITIVE Sensitive     Inducible Clindamycin NEGATIVE Sensitive     * MODERATE METHICILLIN RESISTANT STAPHYLOCOCCUS AUREUS  Culture, blood (routine x 2)     Status: None (Preliminary result)   Collection Time: 03/06/17  6:32 AM  Result Value Ref Range Status   Specimen Description BLOOD LEFT ANTECUBITAL  Final   Special Requests IN PEDIATRIC BOTTLE Blood Culture adequate volume  Final   Culture   Final    NO GROWTH 3 DAYS Performed at [facility], [place] [address], [place], [place] [address]    Report Status PENDING  Incomplete  Culture, blood (routine x 2)     Status: None (Preliminary result)   Collection Time: 03/06/17  6:38 AM  Result Value Ref Range Status   Specimen Description BLOOD LEFT ARM  Final   Special Requests   Final    IN PEDIATRIC BOTTLE Blood Culture results may not be optimal due to an excessive volume of blood received in culture bottles   Culture   Final    NO GROWTH 3 DAYS Performed at [facility], [place] [address] [place], [place] [address]    Report Status PENDING  Incomplete     Terri Piedra, [place] [facility] [place] Pager  03/10/2017  9:24 AM

## 2017-03-10 NOTE — Anesthesia Preprocedure Evaluation (Addendum)
Anesthesia Evaluation  Patient identified by MRN, date of birth, ID band Patient awake    Reviewed: Allergy & Precautions, H&P , NPO status , Patient's Chart, lab work & pertinent test results  Airway Mallampati: III  TM Distance: >3 FB Neck ROM: Full    Dental no notable dental hx. (+) Edentulous Upper, Edentulous Lower, Dental Advisory Given   Pulmonary neg pulmonary ROS, sleep apnea , COPD, former smoker,    Pulmonary exam normal breath sounds clear to auscultation       Cardiovascular hypertension, On Medications + CAD, + CABG and +CHF  negative cardio ROS  + dysrhythmias Atrial Fibrillation  Rhythm:Irregular Rate:Normal     Neuro/Psych negative neurological ROS  negative psych ROS   GI/Hepatic negative GI ROS, Neg liver ROS, GERD  Medicated and Controlled,  Endo/Other  negative endocrine ROSdiabetes, Insulin Dependent  Renal/GU Renal InsufficiencyRenal diseasenegative Renal ROS  negative genitourinary   Musculoskeletal   Abdominal   Peds  Hematology negative hematology ROS (+) anemia ,   Anesthesia Other Findings   Reproductive/Obstetrics negative OB ROS                           Anesthesia Physical Anesthesia Plan  ASA: III  Anesthesia Plan: General   Post-op Pain Management:    Induction: Intravenous  PONV Risk Score and Plan: 3 and Ondansetron and Treatment may vary due to age or medical condition  Airway Management Planned: Oral ETT  Additional Equipment:   Intra-op Plan:   Post-operative Plan: Extubation in OR  Informed Consent: I have reviewed the patients History and Physical, chart, labs and discussed the procedure including the risks, benefits and alternatives for the proposed anesthesia with the patient or authorized representative who has indicated his/her understanding and acceptance.   Dental advisory given  Plan Discussed with: CRNA  Anesthesia Plan  Comments:         Anesthesia Quick Evaluation

## 2017-03-10 NOTE — Op Note (Signed)
Preop diagnosis: Postop hemiarthroplasty early MRSA infection post irrigation debridement and placement of vancomycin and gentamicin absorbable beads  Postop diagnosis: Same  Procedure: Repeat deep irrigation and debridement left hip.  Removal of old antibiotic beads and placement of new bioabsorbable gentamicin vancomycin beads placement of incisional wound VAC.  Surgeon: Rodell Perna MD  Anesthesia: General  Brief history 84 year old male had left hip hemiarthroplasty done 02/24/2017.  He went to skilled nursing home and then somehow got out of the nursing home went back home by himself and has a below-knee amputation on the same side of his hip hemiarthroplasty and was brought by EMS after being found in bed with a draining painful hip dehydration and confusion.  Initial irrigation debridement of his hip was performed finding deep infection positive for MRSA.  His white count had come from 34,000 down to 19 and then went back up to 21 and is taken back to the operating room for repeat washout procedure.  Procedure after induction general anesthesia timeout procedure lateral position patient already was on vancomycin dosage by pharmacy and did not need additional antibiotics.  1015 drape was applied DuraPrep in the area squared with U drapes.  Old retention vertical mattress sutures were removed wound was opened up.  There was some small amount of necrotic fascia which was removed with a rondure pickups with curette bear rondure.  Beads were removed sucked up with a suction or picked up with a rondure pickups or with the tonsil clamp.  Neck of the prosthesis was in satisfactory position prosthesis was stable.  Once remaining suture was visualized distally and the tensor fascia lata which was a nonabsorbable suture and was removed.  Extensive curetting was performed pulse lavage 3 L placement of the new antibiotic beads that had been mixed and then repeat vertical mattress #1 nylon sutures followed by a  Praveena incisional VAC.  Patient tolerated the procedure well and was transferred to recovery room in stable condition.

## 2017-03-10 NOTE — Progress Notes (Signed)
Patient ID: [name], male   DOB: Oct 29, 1931, 84 y.o.   MRN: [id number] Discussed with pt plan for repeat wash out and debridment of hip with VAC placement due to continued hip drainage and elevated WBC. He understands and agrees to proceed.

## 2017-03-10 NOTE — Progress Notes (Signed)
Peripherally Inserted Central Catheter/Midline Placement  The IV Nurse has discussed with the patient and/or persons authorized to consent for the patient, the purpose of this procedure and the potential benefits and risks involved with this procedure.  The benefits include less needle sticks, lab draws from the catheter, and the patient may be discharged home with the catheter. Risks include, but not limited to, infection, bleeding, blood clot (thrombus formation), and puncture of an artery; nerve damage and irregular heartbeat and possibility to perform a PICC exchange if needed/ordered by physician.  Alternatives to this procedure were also discussed.  Bard Power PICC patient education guide, fact sheet on infection prevention and patient information card has been provided to patient /or left at bedside.    PICC/Midline Placement Documentation     Consent obtained via telephone by Legal guardian .   [name] 03/10/2017, 9:55 AM

## 2017-03-10 NOTE — Transfer of Care (Signed)
Immediate Anesthesia Transfer of Care Note  Patient: [name]  Procedure(s) Performed: REPEAT IRRIGATION AND DEBRIDEMENT LEFT DEEP HIP INFECTION, ANTIBIOTIC BEAD PLACEMENT, VAC PLACEMENT (Left Hip)  Patient Location: PACU  Anesthesia Type:General  Level of Consciousness: awake and patient cooperative  Airway & Oxygen Therapy: Patient Spontanous Breathing  Post-op Assessment: Report given to RN and Post -op Vital signs reviewed and stable  Post vital signs: Reviewed and stable  Last Vitals:  Vitals:   03/09/17 2146 03/10/17 0411  BP: (!) 172/89 (!) 170/74  Pulse: 77 83  Resp: 18 18  Temp: 36.6 C 36.7 C  SpO2: 100% 98%    Last Pain:  Vitals:   03/10/17 0411  TempSrc: Oral  PainSc:       Patients Stated Pain Goal: 0 (43/[id number]/57 [id number])  Complications: No apparent anesthesia complications

## 2017-03-10 NOTE — Progress Notes (Signed)
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 Left hip hemiarthroplasty done 02/24/2017.  Admitted again after being found at home with left hip infection and confusion. PMHx:  a-fib, DM, CKD 3, BK infection, UTI, osteopenia, COPD, DVT, cellulitis, CAD, CABG    PT Comments    Pt making fair progress with functional mobility. Able to transfer from bed to recliner chair this session with total A x2 via A-P transfer. Pt anxious and confused throughout, not assisting with mobility. Pt would continue to benefit from skilled physical therapy services at this time while admitted and after d/c to address the below listed limitations in order to improve overall safety and independence with functional mobility.    Follow Up Recommendations  SNF     Equipment Recommendations  None recommended by PT    Recommendations for Other Services       Precautions / Restrictions Precautions Precautions: Fall;Posterior Hip Precaution Comments: pt with confusion, not able to understand posterior hip precautions    Mobility  Bed Mobility Overal bed mobility: Needs Assistance Bed Mobility: Supine to Sit     Supine to sit: +2 for physical assistance;Total assist     General bed mobility comments: used bed sheet to position pt in semi reclined position for A-P transfer   Transfers Overall transfer level: Needs assistance   Transfers: Comptroller transfers: Total assist;+2 physical assistance   General transfer comment: pt refusing sitting EOB to chair, but agreeable to getting to chair once AP transfer technique was described to him  Ambulation/Gait                 Stairs             Wheelchair Mobility    Modified Rankin (Stroke Patients Only)       Balance                                            Cognition Arousal/Alertness: Awake/alert Behavior During Therapy: Flat affect;Anxious Overall Cognitive Status: Impaired/Different from baseline Area of Impairment: Attention;Memory;Following commands;Safety/judgement;Awareness;Problem solving;Orientation                 Orientation Level: Situation;Time Current Attention Level: Selective Memory: Decreased short-term memory;Decreased recall of precautions Following Commands: Follows one step commands inconsistently;Follows one step commands with increased time Safety/Judgement: Decreased awareness of deficits;Decreased awareness of safety Awareness: Intellectual Problem Solving: Slow processing;Decreased initiation;Difficulty sequencing;Requires verbal cues;Requires tactile cues        Exercises      General Comments        Pertinent Vitals/Pain Pain Assessment: Faces Faces Pain Scale: Hurts even more Pain Location: L hip Pain Descriptors / Indicators: Operative site guarding;Grimacing;Sore Pain Intervention(s): Monitored during session;Repositioned;Patient requesting pain meds-RN notified    Home Living                      Prior Function            PT Goals (current goals can now be found in the care plan section) Acute Rehab PT Goals Patient Stated Goal: to  drink water PT Goal Formulation: Patient unable to participate in goal setting Time For Goal Achievement: 03/20/17 Potential to Achieve Goals: Fair Progress towards PT goals: Progressing toward goals    Frequency    Min 2X/week      PT Plan Current plan remains appropriate    Co-evaluation PT/OT/SLP Co-Evaluation/Treatment: Yes Reason for Co-Treatment: For patient/therapist safety;To address functional/ADL transfers PT goals addressed during session: Mobility/safety with  mobility;Balance;Strengthening/ROM OT goals addressed during session: Strengthening/ROM      AM-PAC PT "6 Clicks" Daily Activity  Outcome Measure  Difficulty turning over in bed (including adjusting bedclothes, sheets and blankets)?: Unable Difficulty moving from lying on back to sitting on the side of the bed? : Unable Difficulty sitting down on and standing up from a chair with arms (e.g., wheelchair, bedside commode, etc,.)?: Unable Help needed moving to and from a bed to chair (including a wheelchair)?: Total Help needed walking in hospital room?: Total Help needed climbing 3-5 steps with a railing? : Total 6 Click Score: 6    End of Session   Activity Tolerance: Patient limited by fatigue;Patient limited by pain Patient left: in chair;with call bell/phone within reach;with chair alarm set;with nursing/sitter in room Nurse Communication: Mobility status PT Visit Diagnosis: Muscle weakness (generalized) (M62.81);History of falling (Z91.81);Difficulty in walking, not elsewhere classified (R26.2);Pain Pain - Right/Left: Left Pain - part of body: Hip     Time: 9935-7017 PT Time Calculation (min) (ACUTE ONLY): 27 min  Charges:  $Therapeutic Activity: 8-22 mins                    G Codes:       [place], [place], DPT [place] 03/10/2017, 2:04 PM

## 2017-03-10 NOTE — Progress Notes (Signed)
Occupational Therapy Treatment Patient Details Name: [name] MRN: [id number] DOB: 12/28/1931 Today's Date: 03/10/2017    History of present illness 84 year old male diabetic previous below-knee amputation on the left side done October 2018 fell L had a femoral neck fracture left hip after fall at home. Left hip hemiarthroplasty done 02/24/2017.  Admitted again after being found at home with left hip infection and confusion. PMHx:  a-fib, DM, CKD 3, BK infection, UTI, osteopenia, COPD, DVT, cellulitis, CAD, CABG   OT comments  Pt reluctant to work with therapy today due to L hip pain. Agreeable to transfer to chair using AP technique with chair reclined using bed sheet for support. Pt repeatedly asking for water (NPO for surgery) and pain medication (RN aware).   Follow Up Recommendations  SNF;Supervision/Assistance - 24 hour    Equipment Recommendations       Recommendations for Other Services      Precautions / Restrictions Precautions Precautions: Fall;Posterior Hip Precaution Comments: pt with confusion, not able to understand posterior hip precautions       Mobility Bed Mobility Overal bed mobility: Needs Assistance Bed Mobility: Supine to Sit     Supine to sit: +2 for physical assistance;Total assist     General bed mobility comments: used bed sheet to position pt in semi reclined position for A-P transfer   Transfers Overall transfer level: Needs assistance   Transfers: Comptroller transfers: Total assist;+2 physical assistance   General transfer comment: pt refusing sitting EOB to chair, but agreeable to getting to chair once AP transfer technique was described to him    Balance                                           ADL either performed or assessed with clinical judgement   ADL                                               Vision       Perception     Praxis       Cognition Arousal/Alertness: Awake/alert Behavior During Therapy: Flat affect;Anxious Overall Cognitive Status: Impaired/Different from baseline Area of Impairment: Attention;Memory;Following commands;Safety/judgement;Awareness;Problem solving;Orientation                 Orientation Level: Situation;Time Current Attention Level: Selective Memory: Decreased short-term memory;Decreased recall of precautions Following Commands: Follows one step commands inconsistently;Follows one step commands with increased time Safety/Judgement: Decreased awareness of deficits;Decreased awareness of safety Awareness: Intellectual Problem Solving: Slow processing;Decreased initiation;Difficulty sequencing;Requires verbal cues;Requires tactile cues          Exercises     Shoulder Instructions       General Comments      Pertinent Vitals/ Pain       Pain Assessment: Faces Faces Pain Scale: Hurts even more Pain Location: L hip Pain Descriptors / Indicators: Operative site guarding;Grimacing;Sore  Home Living                                          Prior Functioning/Environment  Frequency  Min 2X/week        Progress Toward Goals  OT Goals(current goals can now be found in the care plan section)  Progress towards OT goals: Progressing toward goals  Acute Rehab OT Goals Patient Stated Goal: to drink water OT Goal Formulation: With patient Time For Goal Achievement: 03/23/17 Potential to Achieve Goals: Good  Plan Discharge plan remains appropriate    Co-evaluation    PT/OT/SLP Co-Evaluation/Treatment: Yes Reason for Co-Treatment: For patient/therapist safety   OT goals addressed during session: Strengthening/ROM      AM-PAC PT "6 Clicks" Daily Activity     Outcome Measure   Help from another person eating meals?: A Little Help from another person taking care of personal grooming?: A Lot Help from another person toileting, which  includes using toliet, bedpan, or urinal?: Total Help from another person bathing (including washing, rinsing, drying)?: Total Help from another person to put on and taking off regular upper body clothing?: A Lot Help from another person to put on and taking off regular lower body clothing?: Total 6 Click Score: 10    End of Session    OT Visit Diagnosis: Muscle weakness (generalized) (M62.81);Other symptoms and signs involving cognitive function;Pain;History of falling (Z91.81) Pain - Right/Left: Left Pain - part of body: Hip   Activity Tolerance Patient limited by pain   Patient Left in chair;with call bell/phone within reach;with chair alarm set   Nurse Communication Mobility status        Time: 7793-9030 OT Time Calculation (min): 27 min  Charges: OT General Charges $OT Visit: 1 Visit OT Treatments $Therapeutic Activity: 8-22 mins  03/10/2017 Nestor Lewandowsky, OTR/L Pager: [phone]  Werner Lean Haze [place] 03/10/2017, 12:55 PM

## 2017-03-10 NOTE — Progress Notes (Signed)
PROGRESS NOTE  Phillip MICHALEC ZOX:[id number] DOB: [birth year] DOA: 03/04/2017 PCP: [name], MD  HPI/Recap of past 24 hours: Phillip Floor Chrismanis a 84 y.o.malefrom home where he was found in his bed with a lot of bloody drainage from around his surgical site where he had a left hip surgery done on February 20 by Dr. Lorin Mercy. Admitted for acute metabolic encephalopathy and sepsis 2/2 to surgical wound infection. Patient admitted with left Hemiarthroplasty infection, underwent I and D left hip and placement of prostalac absorbable vancomycin and gentamycin beads on 3-01 by Dr Lorin Mercy. Culture from wound grew MRSA. He was also diagnosed with MRSA Bacteremia. ID has been following patient in consultation. Plan for 6 Weeks of IV antibiotics. Repeat blood culture negative to date. Patient noted to have significant drainage form left hip. Dr Lorin Mercy performed a repeat I&D on 03/10/17.  Today, saw pt this am, denies any new complaints, denies chest pain, SOB, abdominal pain, fever/chills.  Assessment/Plan: Principal Problem:   Wound infection after surgery Active Problems:   Leukocytosis   Chronic diastolic CHF (congestive heart failure) (HCC)   Acute renal failure superimposed on stage 3 chronic kidney disease (HCC)   Atrial fibrillation with normal ventricular rate (HCC)   CAD (coronary artery disease)   Anemia in chronic kidney disease (CKD)   Sepsis (HCC)   Acute delirium   Hemiarthroplasty left hip MRSA infection Afebrile, with uptrending leukocytosis Recent left hip surgery 02/24/17 Underwent I and D left hip and placement of prostalac absorbable vancomycin and gentamycin beads on 3-01 and 3/6 ID following, continue with vancomycin. Needs 6 weeks of IV antibiotics.  Cultures from wound; Moderate staph aureus Ortho on board  MRSA Bacteremia/Sepsis Blood culture on 3/2 NGTD On IV antibiotics.  ECHO, no evidence of vegetation Needs 6 weeks of IV antibiotics, follow by 3-6 months of  oral therapy (Bactrim or Doxy, and likely long term suppression after per ID recommendations PICC line placed  Acute metabolic encephalopathy Mental status fluctuates  Monitor closely  AKI Resolved s/p IVF  Mild hypercalcemia Monitor closely  Chronic A Fib Rate controlled Continue with metoprolol, eliquis  Normocytic anemia monitor hgb, currently stable   DM type 2 lantus 10 units, SSI.   Depression/anxiety Prozac  BPH Finasteride, tamsulosin   Code Status: Partial code  Family Communication: None at bedside  Disposition Plan: TBD    Consultants:  Orthopedics  ID  Procedures:  S/P I&D   Antimicrobials:  IV Vancomycin  DVT prophylaxis:  Eliquis   Objective: Vitals:   03/10/17 1931 03/10/17 1942 03/10/17 1947 03/10/17 2031  BP:  (!) 145/73 (!) 164/80 (!) 159/70  Pulse: 98 81 92 89  Resp: (!) 5 (!) 7 (!) 9 14  Temp:   98 F (36.7 C) 98.4 F (36.9 C)  TempSrc:      SpO2: 98% 98% 99% 98%  Weight:    74 kg (163 lb 2.3 oz)  Height:        Intake/Output Summary (Last 24 hours) at 03/10/2017 2130 Last data filed at 03/10/2017 1947 Gross per 24 hour  Intake 1120 ml  Output 720 ml  Net 400 ml   Filed Weights   03/05/17 0457 03/07/17 2100 03/10/17 2031  Weight: 74 kg (163 lb 2.2 oz) 77.7 kg (171 lb 4.8 oz) 74 kg (163 lb 2.3 oz)    Exam:   General:  NAD  Cardiovascular: S1, S2 present  Respiratory: CTA  Abdomen: Soft, NT, ND, BS+  Musculoskeletal: L BKA,  tenderness over L hip  Skin: Normal  Psychiatry: Normal mood   Data Reviewed: CBC: Recent Labs  Lab 03/04/17 0602 03/06/17 0622 03/07/17 0522 03/08/17 1545 03/09/17 0747 03/10/17 1050  WBC 34.0* 24.2* 20.4* 19.2* 21.6* 19.4*  NEUTROABS 32.0* 21.3* 18.2*  --   --  17.3*  HGB 9.5* 8.6* 8.7* 8.4* 8.9* 8.7*  HCT 30.8* 27.7* 27.5* 27.4* 28.6* 28.4*  MCV 81.9 81.7 82.6 81.5 81.7 81.6  PLT 407* 449* 452* 498* 457* 979*   Basic Metabolic Panel: Recent Labs  Lab  03/06/17 0622 03/07/17 0522 03/08/17 0402 03/09/17 0747 03/10/17 0541 03/10/17 1050  NA 134* 133* 134* 134* 134* 134*  K 3.5 3.3* 3.5 3.5 3.4* 3.5  CL 100* 101 101 100* 97* 98*  CO2 25 23 24 26 29 29   GLUCOSE 182* 161* 144* 208* 199* 196*  BUN 19 14 11 10 15 13   CREATININE 1.06 0.98 0.87 0.85 0.92 0.87  CALCIUM 10.6* 10.4* 10.6* 10.7* 10.7* 10.8*  MG 1.9  --   --   --   --   --    GFR: Estimated Creatinine Clearance: 60.1 mL/min (by C-G formula based on SCr of 0.87 mg/dL). Liver Function Tests: Recent Labs  Lab 03/04/17 0602 03/06/17 0622  AST 20 13*  ALT 14* 10*  ALKPHOS 155* 188*  BILITOT 1.7* 1.2  PROT 6.6 5.3*  ALBUMIN 2.6* 2.0*   No results for input(s): LIPASE, AMYLASE in the last 168 hours. No results for input(s): AMMONIA in the last 168 hours. Coagulation Profile: No results for input(s): INR, PROTIME in the last 168 hours. Cardiac Enzymes: No results for input(s): CKTOTAL, CKMB, CKMBINDEX, TROPONINI in the last 168 hours. BNP (last 3 results) No results for input(s): PROBNP in the last 8760 hours. HbA1C: No results for input(s): HGBA1C in the last 72 hours. CBG: Recent Labs  Lab 03/09/17 1652 03/10/17 0738 03/10/17 1116 03/10/17 1820 03/10/17 2026  GLUCAP 213* 222* 201* 133* 154*   Lipid Profile: No results for input(s): CHOL, HDL, LDLCALC, TRIG, CHOLHDL, LDLDIRECT in the last 72 hours. Thyroid Function Tests: No results for input(s): TSH, T4TOTAL, FREET4, T3FREE, THYROIDAB in the last 72 hours. Anemia Panel: No results for input(s): VITAMINB12, FOLATE, FERRITIN, TIBC, IRON, RETICCTPCT in the last 72 hours. Urine analysis:    Component Value Date/Time   COLORURINE YELLOW 03/04/2017 0544   APPEARANCEUR HAZY (A) 03/04/2017 0544   APPEARANCEUR Cloudy (A) 11/12/2016 1101   LABSPEC 1.012 03/04/2017 0544   PHURINE 5.0 03/04/2017 0544   GLUCOSEU 50 (A) 03/04/2017 0544   HGBUR SMALL (A) 03/04/2017 0544   BILIRUBINUR NEGATIVE 03/04/2017 0544    BILIRUBINUR Negative 11/12/2016 1101   KETONESUR 5 (A) 03/04/2017 0544   PROTEINUR 100 (A) 03/04/2017 0544   UROBILINOGEN 0.2 12/19/2013 1905   NITRITE NEGATIVE 03/04/2017 0544   LEUKOCYTESUR NEGATIVE 03/04/2017 0544   LEUKOCYTESUR 2+ (A) 11/12/2016 1101   Sepsis Labs: @LABRCNTIP (procalcitonin:4,lacticidven:4)  ) Recent Results (from the past 240 hour(s))  Urine culture     Status: Abnormal   Collection Time: 03/04/17  5:44 AM  Result Value Ref Range Status   Specimen Description   Final    URINE, CATHETERIZED Performed at [facility], [address] [place], [place] [address]    Special Requests   Final    NONE Performed at [facility], [address] [place], [place] [address]    Culture (A)  Final    20,000 COLONIES/mL METHICILLIN RESISTANT STAPHYLOCOCCUS AUREUS   Report Status  03/06/2017 FINAL  Final   Organism ID, Bacteria METHICILLIN RESISTANT STAPHYLOCOCCUS AUREUS (A)  Final      Susceptibility   Methicillin resistant staphylococcus aureus - MIC*    CIPROFLOXACIN >=8 RESISTANT Resistant     GENTAMICIN >=16 RESISTANT Resistant     NITROFURANTOIN <=16 SENSITIVE Sensitive     OXACILLIN >=4 RESISTANT Resistant     TETRACYCLINE <=1 SENSITIVE Sensitive     VANCOMYCIN 1 SENSITIVE Sensitive     TRIMETH/SULFA <=10 SENSITIVE Sensitive     CLINDAMYCIN >=8 RESISTANT Resistant     RIFAMPIN <=0.5 SENSITIVE Sensitive     Inducible Clindamycin NEGATIVE Sensitive     * 20,000 COLONIES/mL METHICILLIN RESISTANT STAPHYLOCOCCUS AUREUS  Culture, blood (routine x 2)     Status: Abnormal   Collection Time: 03/04/17  6:03 AM  Result Value Ref Range Status   Specimen Description   Final    BLOOD RIGHT ARM Performed at [facility], [address], [place], [place] [address]    Special Requests   Final    BOTTLES DRAWN AEROBIC AND ANAEROBIC Blood Culture adequate volume Performed at [facility], [address] [place], [place] [address]    Culture  Setup Time   Final    GRAM  POSITIVE COCCI ANAEROBIC BOTTLE ONLY Gram Stain Report Called to,Read Back By and Verified With: HWTUUE,K @0545  ON 3.1.19 BY BOWMAN,L Performed at [facility], [address], [place], [place] [address]    Culture METHICILLIN RESISTANT STAPHYLOCOCCUS AUREUS (A)  Final   Report Status 03/07/2017 FINAL  Final   Organism ID, Bacteria METHICILLIN RESISTANT STAPHYLOCOCCUS AUREUS  Final      Susceptibility   Methicillin resistant staphylococcus aureus - MIC*    CIPROFLOXACIN >=8 RESISTANT Resistant     ERYTHROMYCIN >=8 RESISTANT Resistant     GENTAMICIN >=16 RESISTANT Resistant     OXACILLIN >=4 RESISTANT Resistant     TETRACYCLINE <=1 SENSITIVE Sensitive     VANCOMYCIN <=0.5 SENSITIVE Sensitive     TRIMETH/SULFA <=10 SENSITIVE Sensitive     CLINDAMYCIN >=8 RESISTANT Resistant     RIFAMPIN <=0.5 SENSITIVE Sensitive     Inducible Clindamycin NEGATIVE Sensitive     * METHICILLIN RESISTANT STAPHYLOCOCCUS AUREUS  Culture, blood (routine x 2)     Status: Abnormal   Collection Time: 03/04/17  6:05 AM  Result Value Ref Range Status   Specimen Description   Final    BLOOD RIGHT FOREARM DRAWN BY RN Performed at [facility], [address] [place], [place] [address]    Special Requests   Final    BOTTLES DRAWN AEROBIC AND ANAEROBIC Blood Culture adequate volume Performed at [facility], [address], [place], [place] [address]    Culture  Setup Time   Final    GRAM POSITIVE COCCI IN BOTH AEROBIC AND ANAEROBIC BOTTLES Gram Stain Report Called to,Read Back By and Verified With: MURPHY,N @ 7948 ON 3.1.19 BY BOWMAN,L    Culture (A)  Final    STAPHYLOCOCCUS SPECIES (COAGULASE NEGATIVE) THE SIGNIFICANCE OF ISOLATING THIS ORGANISM FROM A SINGLE SET OF BLOOD CULTURES WHEN MULTIPLE SETS ARE DRAWN IS UNCERTAIN. PLEASE NOTIFY THE MICROBIOLOGY DEPARTMENT WITHIN ONE WEEK IF SPECIATION AND SENSITIVITIES ARE REQUIRED. Performed at [facility], [place] [address], [place], [place] [address]    Report  Status 03/07/2017 FINAL  Final  Blood Culture ID Panel (Reflexed)     Status: Abnormal   Collection Time: 03/04/17  6:05 AM  Result Value Ref Range Status  Enterococcus species NOT DETECTED NOT DETECTED Final   Listeria monocytogenes NOT DETECTED NOT DETECTED Final   Staphylococcus species DETECTED (A) NOT DETECTED Final    Comment: Methicillin (oxacillin) resistant coagulase negative staphylococcus. Possible blood culture contaminant (unless isolated from more than one blood culture draw or clinical case suggests pathogenicity). No antibiotic treatment is indicated for blood  culture contaminants. CRITICAL RESULT CALLED TO, READ BACK BY AND VERIFIED WITH: M. Radford Pax Pharm.D. 9:40 03/05/17 ([email or web address])    Staphylococcus aureus NOT DETECTED NOT DETECTED Final   Methicillin resistance DETECTED (A) NOT DETECTED Final    Comment: CRITICAL RESULT CALLED TO, READ BACK BY AND VERIFIED WITH: M.  Radford Pax Pharm.D. 9:40 03/05/17 (wilsonm)    Streptococcus species NOT DETECTED NOT DETECTED Final   Streptococcus agalactiae NOT DETECTED NOT DETECTED Final   Streptococcus pneumoniae NOT DETECTED NOT DETECTED Final   Streptococcus pyogenes NOT DETECTED NOT DETECTED Final   Acinetobacter baumannii NOT DETECTED NOT DETECTED Final   Enterobacteriaceae species NOT DETECTED NOT DETECTED Final   Enterobacter cloacae complex NOT DETECTED NOT DETECTED Final   Escherichia coli NOT DETECTED NOT DETECTED Final   Klebsiella oxytoca NOT DETECTED NOT DETECTED Final   Klebsiella pneumoniae NOT DETECTED NOT DETECTED Final   Proteus species NOT DETECTED NOT DETECTED Final   Serratia marcescens NOT DETECTED NOT DETECTED Final   Haemophilus influenzae NOT DETECTED NOT DETECTED Final   Neisseria meningitidis NOT DETECTED NOT DETECTED Final   Pseudomonas aeruginosa NOT DETECTED NOT DETECTED Final   Candida albicans NOT DETECTED NOT DETECTED Final   Candida glabrata NOT DETECTED NOT DETECTED Final   Candida krusei NOT DETECTED  NOT DETECTED Final   Candida parapsilosis NOT DETECTED NOT DETECTED Final   Candida tropicalis NOT DETECTED NOT DETECTED Final    Comment: Performed at [facility], [place] [address], [place], [place] [address]  Aerobic Culture (superficial specimen)     Status: None   Collection Time: 03/04/17  8:48 AM  Result Value Ref Range Status   Specimen Description   Final    HIP LEFT Performed at [facility], [address], [place], [place] [address]    Special Requests   Final    Immunocompromised Performed at [facility], [address] [place], [place] [address]    Gram Stain   Final    RARE WBC PRESENT, PREDOMINANTLY PMN FEW GRAM POSITIVE COCCI IN PAIRS    Culture   Final    ABUNDANT METHICILLIN RESISTANT STAPHYLOCOCCUS AUREUS ABUNDANT DIPHTHEROIDS(CORYNEBACTERIUM SPECIES) Standardized susceptibility testing for this organism is not available. Performed at [facility], [place] [address] [place], [place] [address]    Report Status 03/07/2017 FINAL  Final   Organism ID, Bacteria METHICILLIN RESISTANT STAPHYLOCOCCUS AUREUS  Final      Susceptibility   Methicillin resistant staphylococcus aureus - MIC*    CIPROFLOXACIN >=8 RESISTANT Resistant     ERYTHROMYCIN >=8 RESISTANT Resistant     GENTAMICIN >=16 RESISTANT Resistant     OXACILLIN >=4 RESISTANT Resistant     TETRACYCLINE <=1 SENSITIVE Sensitive     VANCOMYCIN <=0.5 SENSITIVE Sensitive     TRIMETH/SULFA <=10 SENSITIVE Sensitive     CLINDAMYCIN >=8 RESISTANT Resistant     RIFAMPIN <=0.5 SENSITIVE Sensitive     Inducible Clindamycin NEGATIVE Sensitive     * ABUNDANT METHICILLIN RESISTANT STAPHYLOCOCCUS AUREUS  Aerobic Culture (superficial specimen)     Status: None   Collection Time: 03/05/17 11:42 AM  Result Value Ref Range Status  Specimen Description WOUND LEFT HIP  Final   Special Requests PATIENT ON FOLLOWING ZOSYN GENTAMYCIN  Final   Gram Stain   Final    FEW WBC PRESENT,BOTH PMN AND MONONUCLEAR FEW GRAM  POSITIVE COCCI IN CLUSTERS Performed at [facility], [place] [address], [place], [place] [address]    Culture   Final    MODERATE METHICILLIN RESISTANT STAPHYLOCOCCUS AUREUS   Report Status 03/07/2017 FINAL  Final   Organism ID, Bacteria METHICILLIN RESISTANT STAPHYLOCOCCUS AUREUS  Final      Susceptibility   Methicillin resistant staphylococcus aureus - MIC*    CIPROFLOXACIN >=8 RESISTANT Resistant     ERYTHROMYCIN >=8 RESISTANT Resistant     GENTAMICIN <=0.5 SENSITIVE Sensitive     OXACILLIN >=4 RESISTANT Resistant     TETRACYCLINE <=1 SENSITIVE Sensitive     VANCOMYCIN 1 SENSITIVE Sensitive     TRIMETH/SULFA <=10 SENSITIVE Sensitive     CLINDAMYCIN >=8 RESISTANT Resistant     RIFAMPIN <=0.5 SENSITIVE Sensitive     Inducible Clindamycin NEGATIVE Sensitive     * MODERATE METHICILLIN RESISTANT STAPHYLOCOCCUS AUREUS  Culture, blood (routine x 2)     Status: None (Preliminary result)   Collection Time: 03/06/17  6:32 AM  Result Value Ref Range Status   Specimen Description BLOOD LEFT ANTECUBITAL  Final   Special Requests IN PEDIATRIC BOTTLE Blood Culture adequate volume  Final   Culture   Final    NO GROWTH 4 DAYS Performed at [facility], [place] [address], [place], [place] [address]    Report Status PENDING  Incomplete  Culture, blood (routine x 2)     Status: None (Preliminary result)   Collection Time: 03/06/17  6:38 AM  Result Value Ref Range Status   Specimen Description BLOOD LEFT ARM  Final   Special Requests   Final    IN PEDIATRIC BOTTLE Blood Culture results may not be optimal due to an excessive volume of blood received in culture bottles   Culture   Final    NO GROWTH 4 DAYS Performed at [facility], [place] [address], [place], [place] [address]    Report Status PENDING  Incomplete      Studies: Dg Chest Port 1 View  Result Date: 03/10/2017 CLINICAL DATA:  Line placement EXAM: PORTABLE CHEST 1 VIEW COMPARISON:  03/06/2017 FINDINGS: Right PICC line  is been placed with the tip at the cavoatrial junction. Prior CABG. Cardiomegaly. Minimal left basilar density, likely atelectasis. No confluent opacity on the right. No effusions or acute bony abnormality. IMPRESSION: Right PICC line tip at the cavoatrial junction. Cardiomegaly. Left base atelectasis. Electronically Signed   By: Rolm Baptise M.D.   On: 03/10/2017 10:49   [place] Ekg Site Rite  Result Date: 03/10/2017 If Site Rite image not attached, placement could not be confirmed due to current cardiac rhythm.   Scheduled Meds: . apixaban  5 mg Oral BID  . docusate sodium  100 mg Oral BID  . feeding supplement (ENSURE ENLIVE)  237 mL Oral BID BM  . fentaNYL      . finasteride  5 mg Oral Daily  . FLUoxetine  20 mg Oral Daily  . insulin aspart  0-15 Units Subcutaneous TID WC  . insulin glargine  10 Units Subcutaneous QHS  . metoprolol succinate  25 mg Oral Daily  . pantoprazole  40 mg Oral Daily  . senna-docusate  1 tablet Oral BID  . sodium chloride flush  3  mL Intravenous Q12H  . tamsulosin  0.4 mg Oral Daily    Continuous Infusions: . sodium chloride 75 mL/hr at 03/10/17 0845  . ceFAZolin    . lactated ringers Stopped (03/10/17 1747)  . vancomycin Stopped (03/10/17 1239)     LOS: 6 days     Alma Friendly, MD [facility]   If 7PM-7AM, please contact night-coverage [email or web address] Password [email or web address] 03/10/2017, 9:30 PM

## 2017-03-11 ENCOUNTER — Encounter (HOSPITAL_COMMUNITY): Payer: Self-pay | Admitting: Orthopaedic Surgery

## 2017-03-11 LAB — CBC WITH DIFFERENTIAL/PLATELET
BASOS ABS: 0 10*3/uL (ref 0.0–0.1)
Basophils Relative: 0 %
Eosinophils Absolute: 0.3 10*3/uL (ref 0.0–0.7)
Eosinophils Relative: 1 %
HCT: 28.9 % — ABNORMAL LOW (ref 39.0–52.0)
HEMOGLOBIN: 8.8 g/dL — AB (ref 13.0–17.0)
LYMPHS ABS: 0.8 10*3/uL (ref 0.7–4.0)
LYMPHS PCT: 3 %
MCH: 25.2 pg — AB (ref 26.0–34.0)
MCHC: 30.4 g/dL (ref 30.0–36.0)
MCV: 82.8 fL (ref 78.0–100.0)
Monocytes Absolute: 1.6 10*3/uL — ABNORMAL HIGH (ref 0.1–1.0)
Monocytes Relative: 7 %
NEUTROS PCT: 89 %
Neutro Abs: 20.3 10*3/uL — ABNORMAL HIGH (ref 1.7–7.7)
Platelets: 467 10*3/uL — ABNORMAL HIGH (ref 150–400)
RBC: 3.49 MIL/uL — AB (ref 4.22–5.81)
RDW: 16.8 % — ABNORMAL HIGH (ref 11.5–15.5)
WBC: 23 10*3/uL — AB (ref 4.0–10.5)

## 2017-03-11 LAB — GLUCOSE, CAPILLARY
GLUCOSE-CAPILLARY: 104 mg/dL — AB (ref 65–99)
GLUCOSE-CAPILLARY: 107 mg/dL — AB (ref 65–99)
GLUCOSE-CAPILLARY: 124 mg/dL — AB (ref 65–99)
GLUCOSE-CAPILLARY: 149 mg/dL — AB (ref 65–99)
GLUCOSE-CAPILLARY: 151 mg/dL — AB (ref 65–99)
Glucose-Capillary: 160 mg/dL — ABNORMAL HIGH (ref 65–99)

## 2017-03-11 LAB — BASIC METABOLIC PANEL
ANION GAP: 9 (ref 5–15)
BUN: 12 mg/dL (ref 6–20)
CHLORIDE: 96 mmol/L — AB (ref 101–111)
CO2: 30 mmol/L (ref 22–32)
Calcium: 10.9 mg/dL — ABNORMAL HIGH (ref 8.9–10.3)
Creatinine, Ser: 0.9 mg/dL (ref 0.61–1.24)
GFR calc Af Amer: >60 mL/min (ref >60)
GLUCOSE: 149 mg/dL — AB (ref 65–99)
POTASSIUM: 3.7 mmol/L (ref 3.5–5.1)
Sodium: 135 mmol/L (ref 135–145)

## 2017-03-11 LAB — CULTURE, BLOOD (ROUTINE X 2)
Culture: NO GROWTH
Culture: NO GROWTH
SPECIAL REQUESTS: ADEQUATE

## 2017-03-11 MED ORDER — AMLODIPINE BESYLATE 10 MG PO TABS
10.0000 mg | ORAL_TABLET | Freq: Every day | ORAL | Status: DC
  Administered 2017-03-11 – 2017-03-19 (×9): 10 mg via ORAL
  Filled 2017-03-11 (×9): qty 1

## 2017-03-11 MED ORDER — WHITE PETROLATUM EX OINT
TOPICAL_OINTMENT | CUTANEOUS | Status: AC
  Administered 2017-03-11: 19:00:00
  Filled 2017-03-11: qty 28.35

## 2017-03-11 MED ORDER — CLONIDINE HCL 0.1 MG PO TABS
0.1000 mg | ORAL_TABLET | Freq: Every day | ORAL | Status: DC
  Administered 2017-03-11 – 2017-03-16 (×6): 0.1 mg via ORAL
  Filled 2017-03-11 (×6): qty 1

## 2017-03-11 NOTE — Consult Note (Signed)
[facility] Nurse wound consult note Reason for Consult: Left hip VAC dressing change Wound type: Surgical incision Patient had Prevena Incisional VAC dressing placed by the surgical team in the OR on 03/10/17. Please see the note by Dr. Rodell Perna on 03/10/17 at 5:59 PM.    Prevena Incisional VAC dressings are designed to remain intact for 7 days.  Therefore, VAC dressings will not need to be performed before 03/17/17.  Thank you for the consult.  Discussed plan of care with the patient and bedside nurse.  [place] nurse will not follow at this time.  Please re-consult the [facility] team if needed.  Val Riles, RN, MSN, CWOCN, CNS-BC, pager [phone]

## 2017-03-11 NOTE — Progress Notes (Addendum)
PROGRESS NOTE  [name] HYI:[id number] DOB: 03-27-1931 DOA: 03/04/2017 PCP: [name], MD  HPI/Recap of past 24 hours: [name] a 84 y.o.malefrom home where he was found in his bed with a lot of bloody drainage from around his surgical site where he had a left hip surgery done on February 20 by Dr. Lorin Mercy. Admitted for acute metabolic encephalopathy and sepsis 2/2 to surgical wound infection. Patient admitted with left Hemiarthroplasty infection, underwent I and D left hip and placement of prostalac absorbable vancomycin and gentamycin beads on 3-01 by Dr Lorin Mercy. Culture from wound grew MRSA. He was also diagnosed with MRSA Bacteremia. ID has been following patient in consultation. Plan for 6 Weeks of IV antibiotics. Repeat blood culture negative to date. Patient noted to have significant drainage form left hip. Dr Lorin Mercy performed a repeat I&D on 03/10/17.  Today, saw pt this am, noted to be somewhat confused, likely baseline. Denies any new complaints, denies chest pain, SOB, abdominal pain, fever/chills.  Assessment/Plan: Principal Problem:   Wound infection after surgery Active Problems:   Leukocytosis   Chronic diastolic CHF (congestive heart failure) (HCC)   Acute renal failure superimposed on stage 3 chronic kidney disease (HCC)   Atrial fibrillation with normal ventricular rate (HCC)   CAD (coronary artery disease)   Anemia in chronic kidney disease (CKD)   Sepsis (HCC)   Acute delirium   Hemiarthroplasty left hip MRSA infection Afebrile, with uptrending leukocytosis Recent left hip surgery 02/24/17 Underwent I and D left hip and placement of prostalac absorbable vancomycin and gentamycin beads on 3-01 and 3/6 ID following, continue with vancomycin. Needs 6 weeks of IV antibiotics (till 4/18) Cultures from wound; Moderate staph aureus Ortho on board  MRSA Bacteremia/Sepsis Blood culture on 3/2 NGTD On IV antibiotics.  ECHO, no evidence of  vegetation Needs 6 weeks of IV antibiotics, follow by 3-6 months of oral therapy (Bactrim or Doxy, and likely long term suppression after per ID recommendations PICC line placed  Acute metabolic encephalopathy Mental status fluctuates  Monitor closely  AKI Resolved s/p IVF  Mild hypercalcemia Monitor closely  Chronic A Fib Rate controlled Continue with metoprolol, eliquis  HTN Uncontrolled as home meds were held due to bacteremia Restarted home norvasc, clonidine  Normocytic anemia monitor hgb, currently stable   DM type 2 lantus 10 units, SSI.   Depression/anxiety Prozac  BPH Finasteride, tamsulosin   Code Status: Partial code  Family Communication: None at bedside  Disposition Plan: SNF    Consultants:  Orthopedics  ID  Procedures:  S/P I&D   Antimicrobials:  IV Vancomycin  DVT prophylaxis:  Eliquis   Objective: Vitals:   03/10/17 2031 03/11/17 0001 03/11/17 0548 03/11/17 1000  BP: (!) 159/70 (!) 156/88 (!) 168/82 (!) 171/89  Pulse: 89 84 88 86  Resp: 14 16 16 18   Temp: 98.4 F (36.9 C) 98 F (36.7 C) 98.4 F (36.9 C) 98 F (36.7 C)  TempSrc:  Oral Oral Oral  SpO2: 98% 97% 99% 98%  Weight: 74 kg (163 lb 2.3 oz)     Height:        Intake/Output Summary (Last 24 hours) at 03/11/2017 1708 Last data filed at 03/11/2017 1115 Gross per 24 hour  Intake 3193.75 ml  Output 1070 ml  Net 2123.75 ml   Filed Weights   03/05/17 0457 03/07/17 2100 03/10/17 2031  Weight: 74 kg (163 lb 2.2 oz) 77.7 kg (171 lb 4.8 oz) 74 kg (163 lb 2.3 oz)  Exam:   General:  NAD  Cardiovascular: S1, S2 present  Respiratory: CTA  Abdomen: Soft, NT, ND, BS+  Musculoskeletal: L BKA, tenderness over L hip, VAC in place  Skin: Normal  Psychiatry: Normal mood   Data Reviewed: CBC: Recent Labs  Lab 03/06/17 0622 03/07/17 0522 03/08/17 1545 03/09/17 0747 03/10/17 1050 03/11/17 0530  WBC 24.2* 20.4* 19.2* 21.6* 19.4* 23.0*  NEUTROABS  21.3* 18.2*  --   --  17.3* 20.3*  HGB 8.6* 8.7* 8.4* 8.9* 8.7* 8.8*  HCT 27.7* 27.5* 27.4* 28.6* 28.4* 28.9*  MCV 81.7 82.6 81.5 81.7 81.6 82.8  PLT 449* 452* 498* 457* 442* 161*   Basic Metabolic Panel: Recent Labs  Lab 03/06/17 0622  03/08/17 0402 03/09/17 0747 03/10/17 0541 03/10/17 1050 03/11/17 0530  NA 134*   < > 134* 134* 134* 134* 135  K 3.5   < > 3.5 3.5 3.4* 3.5 3.7  CL 100*   < > 101 100* 97* 98* 96*  CO2 25   < > 24 26 29 29 30   GLUCOSE 182*   < > 144* 208* 199* 196* 149*  BUN 19   < > 11 10 15 13 12   CREATININE 1.06   < > 0.87 0.85 0.92 0.87 0.90  CALCIUM 10.6*   < > 10.6* 10.7* 10.7* 10.8* 10.9*  MG 1.9  --   --   --   --   --   --    < > = values in this interval not displayed.   GFR: Estimated Creatinine Clearance: 58.1 mL/min (by C-G formula based on SCr of 0.9 mg/dL). Liver Function Tests: Recent Labs  Lab 03/06/17 0622  AST 13*  ALT 10*  ALKPHOS 188*  BILITOT 1.2  PROT 5.3*  ALBUMIN 2.0*   No results for input(s): LIPASE, AMYLASE in the last 168 hours. No results for input(s): AMMONIA in the last 168 hours. Coagulation Profile: No results for input(s): INR, PROTIME in the last 168 hours. Cardiac Enzymes: No results for input(s): CKTOTAL, CKMB, CKMBINDEX, TROPONINI in the last 168 hours. BNP (last 3 results) No results for input(s): PROBNP in the last 8760 hours. HbA1C: No results for input(s): HGBA1C in the last 72 hours. CBG: Recent Labs  Lab 03/10/17 2026 03/10/17 2359 03/11/17 0527 03/11/17 0939 03/11/17 1158  GLUCAP 154* 149* 124* 160* 151*   Lipid Profile: No results for input(s): CHOL, HDL, LDLCALC, TRIG, CHOLHDL, LDLDIRECT in the last 72 hours. Thyroid Function Tests: No results for input(s): TSH, T4TOTAL, FREET4, T3FREE, THYROIDAB in the last 72 hours. Anemia Panel: No results for input(s): VITAMINB12, FOLATE, FERRITIN, TIBC, IRON, RETICCTPCT in the last 72 hours. Urine analysis:    Component Value Date/Time   COLORURINE  YELLOW 03/04/2017 0544   APPEARANCEUR HAZY (A) 03/04/2017 0544   APPEARANCEUR Cloudy (A) 11/12/2016 1101   LABSPEC 1.012 03/04/2017 0544   PHURINE 5.0 03/04/2017 0544   GLUCOSEU 50 (A) 03/04/2017 0544   HGBUR SMALL (A) 03/04/2017 0544   BILIRUBINUR NEGATIVE 03/04/2017 0544   BILIRUBINUR Negative 11/12/2016 1101   KETONESUR 5 (A) 03/04/2017 0544   PROTEINUR 100 (A) 03/04/2017 0544   UROBILINOGEN 0.2 12/19/2013 1905   NITRITE NEGATIVE 03/04/2017 0544   LEUKOCYTESUR NEGATIVE 03/04/2017 0544   LEUKOCYTESUR 2+ (A) 11/12/2016 1101   Sepsis Labs: @LABRCNTIP (procalcitonin:4,lacticidven:4)  ) Recent Results (from the past 240 hour(s))  Urine culture     Status: Abnormal   Collection Time: 03/04/17  5:44 AM  Result Value Ref Range Status  Specimen Description   Final    URINE, CATHETERIZED Performed at [facility], [address], [place], [place] [address]    Special Requests   Final    NONE Performed at [facility], [address], [place], [place] [address]    Culture (A)  Final    20,000 COLONIES/mL METHICILLIN RESISTANT STAPHYLOCOCCUS AUREUS   Report Status 03/06/2017 FINAL  Final   Organism ID, Bacteria METHICILLIN RESISTANT STAPHYLOCOCCUS AUREUS (A)  Final      Susceptibility   Methicillin resistant staphylococcus aureus - MIC*    CIPROFLOXACIN >=8 RESISTANT Resistant     GENTAMICIN >=16 RESISTANT Resistant     NITROFURANTOIN <=16 SENSITIVE Sensitive     OXACILLIN >=4 RESISTANT Resistant     TETRACYCLINE <=1 SENSITIVE Sensitive     VANCOMYCIN 1 SENSITIVE Sensitive     TRIMETH/SULFA <=10 SENSITIVE Sensitive     CLINDAMYCIN >=8 RESISTANT Resistant     RIFAMPIN <=0.5 SENSITIVE Sensitive     Inducible Clindamycin NEGATIVE Sensitive     * 20,000 COLONIES/mL METHICILLIN RESISTANT STAPHYLOCOCCUS AUREUS  Culture, blood (routine x 2)     Status: Abnormal   Collection Time: 03/04/17  6:03 AM  Result Value Ref Range Status   Specimen Description   Final    BLOOD RIGHT  ARM Performed at [facility], [address] [place], [place] [address]    Special Requests   Final    BOTTLES DRAWN AEROBIC AND ANAEROBIC Blood Culture adequate volume Performed at [facility], [address] [place], [place] [address]    Culture  Setup Time   Final    GRAM POSITIVE COCCI ANAEROBIC BOTTLE ONLY Gram Stain Report Called to,Read Back By and Verified With: MURPHY,N @0545  ON 3.1.19 BY BOWMAN,L Performed at [facility], [address] [place], [place] [address]    Culture METHICILLIN RESISTANT STAPHYLOCOCCUS AUREUS (A)  Final   Report Status 03/07/2017 FINAL  Final   Organism ID, Bacteria METHICILLIN RESISTANT STAPHYLOCOCCUS AUREUS  Final      Susceptibility   Methicillin resistant staphylococcus aureus - MIC*    CIPROFLOXACIN >=8 RESISTANT Resistant     ERYTHROMYCIN >=8 RESISTANT Resistant     GENTAMICIN >=16 RESISTANT Resistant     OXACILLIN >=4 RESISTANT Resistant     TETRACYCLINE <=1 SENSITIVE Sensitive     VANCOMYCIN <=0.5 SENSITIVE Sensitive     TRIMETH/SULFA <=10 SENSITIVE Sensitive     CLINDAMYCIN >=8 RESISTANT Resistant     RIFAMPIN <=0.5 SENSITIVE Sensitive     Inducible Clindamycin NEGATIVE Sensitive     * METHICILLIN RESISTANT STAPHYLOCOCCUS AUREUS  Culture, blood (routine x 2)     Status: Abnormal   Collection Time: 03/04/17  6:05 AM  Result Value Ref Range Status   Specimen Description   Final    BLOOD RIGHT FOREARM DRAWN BY RN Performed at [facility], [address], [place], [place] [address]    Special Requests   Final    BOTTLES DRAWN AEROBIC AND ANAEROBIC Blood Culture adequate volume Performed at [facility], [address] [place], [place] [address]    Culture  Setup Time   Final    GRAM POSITIVE COCCI IN BOTH AEROBIC AND ANAEROBIC BOTTLES Gram Stain Report Called to,Read Back By and Verified With: MURPHY,N @ 4034 ON 3.1.19 BY BOWMAN,L    Culture (A)  Final    STAPHYLOCOCCUS SPECIES (COAGULASE NEGATIVE) THE SIGNIFICANCE OF ISOLATING  THIS ORGANISM FROM A SINGLE SET OF BLOOD CULTURES WHEN MULTIPLE SETS ARE DRAWN  IS UNCERTAIN. PLEASE NOTIFY THE MICROBIOLOGY DEPARTMENT WITHIN ONE WEEK IF SPECIATION AND SENSITIVITIES ARE REQUIRED. Performed at [facility], [place] [address] [place], [place] [address]    Report Status 03/07/2017 FINAL  Final  Blood Culture ID Panel (Reflexed)     Status: Abnormal   Collection Time: 03/04/17  6:05 AM  Result Value Ref Range Status   Enterococcus species NOT DETECTED NOT DETECTED Final   Listeria monocytogenes NOT DETECTED NOT DETECTED Final   Staphylococcus species DETECTED (A) NOT DETECTED Final    Comment: Methicillin (oxacillin) resistant coagulase negative staphylococcus. Possible blood culture contaminant (unless isolated from more than one blood culture draw or clinical case suggests pathogenicity). No antibiotic treatment is indicated for blood  culture contaminants. CRITICAL RESULT CALLED TO, READ BACK BY AND VERIFIED WITH: M. Radford Pax Pharm.D. 9:40 03/05/17 ([email or web address])    Staphylococcus aureus NOT DETECTED NOT DETECTED Final   Methicillin resistance DETECTED (A) NOT DETECTED Final    Comment: CRITICAL RESULT CALLED TO, READ BACK BY AND VERIFIED WITH: M.  Radford Pax Pharm.D. 9:40 03/05/17 ([email or web address])    Streptococcus species NOT DETECTED NOT DETECTED Final   Streptococcus agalactiae NOT DETECTED NOT DETECTED Final   Streptococcus pneumoniae NOT DETECTED NOT DETECTED Final   Streptococcus pyogenes NOT DETECTED NOT DETECTED Final   Acinetobacter baumannii NOT DETECTED NOT DETECTED Final   Enterobacteriaceae species NOT DETECTED NOT DETECTED Final   Enterobacter cloacae complex NOT DETECTED NOT DETECTED Final   Escherichia coli NOT DETECTED NOT DETECTED Final   Klebsiella oxytoca NOT DETECTED NOT DETECTED Final   Klebsiella pneumoniae NOT DETECTED NOT DETECTED Final   Proteus species NOT DETECTED NOT DETECTED Final   Serratia marcescens NOT DETECTED NOT DETECTED Final   Haemophilus  influenzae NOT DETECTED NOT DETECTED Final   Neisseria meningitidis NOT DETECTED NOT DETECTED Final   Pseudomonas aeruginosa NOT DETECTED NOT DETECTED Final   Candida albicans NOT DETECTED NOT DETECTED Final   Candida glabrata NOT DETECTED NOT DETECTED Final   Candida krusei NOT DETECTED NOT DETECTED Final   Candida parapsilosis NOT DETECTED NOT DETECTED Final   Candida tropicalis NOT DETECTED NOT DETECTED Final    Comment: Performed at [facility], [place] [address], [place], [place] [address]  Aerobic Culture (superficial specimen)     Status: None   Collection Time: 03/04/17  8:48 AM  Result Value Ref Range Status   Specimen Description   Final    HIP LEFT Performed at [facility], [address] [place], [place] [address]    Special Requests   Final    Immunocompromised Performed at [facility], [address], [place], [place] [address]    Gram Stain   Final    RARE WBC PRESENT, PREDOMINANTLY PMN FEW GRAM POSITIVE COCCI IN PAIRS    Culture   Final    ABUNDANT METHICILLIN RESISTANT STAPHYLOCOCCUS AUREUS ABUNDANT DIPHTHEROIDS(CORYNEBACTERIUM SPECIES) Standardized susceptibility testing for this organism is not available. Performed at [facility], [place] [address], [place], [place] [address]    Report Status 03/07/2017 FINAL  Final   Organism ID, Bacteria METHICILLIN RESISTANT STAPHYLOCOCCUS AUREUS  Final      Susceptibility   Methicillin resistant staphylococcus aureus - MIC*    CIPROFLOXACIN >=8 RESISTANT Resistant     ERYTHROMYCIN >=8 RESISTANT Resistant     GENTAMICIN >=16 RESISTANT Resistant     OXACILLIN >=4 RESISTANT Resistant     TETRACYCLINE <=1 SENSITIVE Sensitive     VANCOMYCIN <=0.5 SENSITIVE Sensitive  TRIMETH/SULFA <=10 SENSITIVE Sensitive     CLINDAMYCIN >=8 RESISTANT Resistant     RIFAMPIN <=0.5 SENSITIVE Sensitive     Inducible Clindamycin NEGATIVE Sensitive     * ABUNDANT METHICILLIN RESISTANT STAPHYLOCOCCUS AUREUS  Aerobic Culture  (superficial specimen)     Status: None   Collection Time: 03/05/17 11:42 AM  Result Value Ref Range Status   Specimen Description WOUND LEFT HIP  Final   Special Requests PATIENT ON FOLLOWING ZOSYN GENTAMYCIN  Final   Gram Stain   Final    FEW WBC PRESENT,BOTH PMN AND MONONUCLEAR FEW GRAM POSITIVE COCCI IN CLUSTERS Performed at [facility], [place] [address] [place], [place] [address]    Culture   Final    MODERATE METHICILLIN RESISTANT STAPHYLOCOCCUS AUREUS   Report Status 03/07/2017 FINAL  Final   Organism ID, Bacteria METHICILLIN RESISTANT STAPHYLOCOCCUS AUREUS  Final      Susceptibility   Methicillin resistant staphylococcus aureus - MIC*    CIPROFLOXACIN >=8 RESISTANT Resistant     ERYTHROMYCIN >=8 RESISTANT Resistant     GENTAMICIN <=0.5 SENSITIVE Sensitive     OXACILLIN >=4 RESISTANT Resistant     TETRACYCLINE <=1 SENSITIVE Sensitive     VANCOMYCIN 1 SENSITIVE Sensitive     TRIMETH/SULFA <=10 SENSITIVE Sensitive     CLINDAMYCIN >=8 RESISTANT Resistant     RIFAMPIN <=0.5 SENSITIVE Sensitive     Inducible Clindamycin NEGATIVE Sensitive     * MODERATE METHICILLIN RESISTANT STAPHYLOCOCCUS AUREUS  Culture, blood (routine x 2)     Status: None   Collection Time: 03/06/17  6:32 AM  Result Value Ref Range Status   Specimen Description BLOOD LEFT ANTECUBITAL  Final   Special Requests IN PEDIATRIC BOTTLE Blood Culture adequate volume  Final   Culture   Final    NO GROWTH 5 DAYS Performed at [facility], [place] [address], [place], [place] [address]    Report Status 03/11/2017 FINAL  Final  Culture, blood (routine x 2)     Status: None   Collection Time: 03/06/17  6:38 AM  Result Value Ref Range Status   Specimen Description BLOOD LEFT ARM  Final   Special Requests   Final    IN PEDIATRIC BOTTLE Blood Culture results may not be optimal due to an excessive volume of blood received in culture bottles   Culture   Final    NO GROWTH 5 DAYS Performed at [facility], [place] [address], [place], [place] [address]    Report Status 03/11/2017 FINAL  Final      Studies: No results found.  Scheduled Meds: . amLODipine  10 mg Oral Daily  . apixaban  5 mg Oral BID  . cloNIDine  0.1 mg Oral QHS  . docusate sodium  100 mg Oral BID  . feeding supplement (ENSURE ENLIVE)  237 mL Oral BID BM  . finasteride  5 mg Oral Daily  . FLUoxetine  20 mg Oral Daily  . insulin aspart  0-15 Units Subcutaneous TID WC  . insulin glargine  10 Units Subcutaneous QHS  . metoprolol succinate  25 mg Oral Daily  . pantoprazole  40 mg Oral Daily  . senna-docusate  1 tablet Oral BID  . tamsulosin  0.4 mg Oral Daily    Continuous Infusions: . vancomycin Stopped (03/11/17 1345)     LOS: 7 days     Alma Friendly, MD [facility]   If 7PM-7AM, please contact night-coverage [email or web address] Password  [email or web address] 03/11/2017, 5:08 PM

## 2017-03-11 NOTE — Progress Notes (Signed)
[facility]  Date of Admission:  03/04/2017             ASSESSMENT/PLAN  Left Hip Hemiarthroplasty Infection with MRSA - Successful washout with VAC placement yesterday. VAC site appears clean, dry and intact.   1. Continue Vancomycin.  2. Plan for 6 weeks of vancomycin from surgical date pending cultures or other complications. Potential end date of 04/22/17 to be followed by oral therapy for suppression.   MRSA Bacteremia - Blood cultures remain with no growth to date. PICC placed yesterday. Should now have source control with most recent washout of left hip.   1. Continue Vancomycin  Therapeutic Drug Monitoring - Kidney function appears stable with vancomycin with no evidence of acute kidney injury.     Principal Problem:   Wound infection after surgery Active Problems:   Leukocytosis   Chronic diastolic CHF (congestive heart failure) (HCC)   Acute renal failure superimposed on stage 3 chronic kidney disease (HCC)   Atrial fibrillation with normal ventricular rate (HCC)   CAD (coronary artery disease)   Anemia in chronic kidney disease (CKD)   Sepsis (HCC)   Acute delirium   . apixaban  5 mg Oral BID  . docusate sodium  100 mg Oral BID  . feeding supplement (ENSURE ENLIVE)  237 mL Oral BID BM  . finasteride  5 mg Oral Daily  . FLUoxetine  20 mg Oral Daily  . insulin aspart  0-15 Units Subcutaneous TID WC  . insulin glargine  10 Units Subcutaneous QHS  . metoprolol succinate  25 mg Oral Daily  . pantoprazole  40 mg Oral Daily  . senna-docusate  1 tablet Oral BID  . tamsulosin  0.4 mg Oral Daily    SUBJECTIVE:  Washout performed yesterday with VAC placement. No complications. Some necrotic fascia was noted and removed. Afebrile overnight with continued leukocytosis.   Continues to have mild hip pain. Otherwise no new concerns overnight.   No Known Allergies   Review of Systems: Review of Systems  Constitutional: Negative for  chills, fever and weight loss.  Respiratory: Negative for cough, shortness of breath and wheezing.   Cardiovascular: Negative for chest pain and leg swelling.  Neurological: Negative for weakness.      OBJECTIVE: Vitals:   03/10/17 1947 03/10/17 2031 03/11/17 0001 03/11/17 0548  BP: (!) 164/80 (!) 159/70 (!) 156/88 (!) 168/82  Pulse: 92 89 84 88  Resp: (!) 9 14 16 16   Temp: 98 F (36.7 C) 98.4 F (36.9 C) 98 F (36.7 C) 98.4 F (36.9 C)  TempSrc:   Oral Oral  SpO2: 99% 98% 97% 99%  Weight:  163 lb 2.3 oz (74 kg)    Height:       Body mass index is 25.55 kg/m.  Physical Exam  Constitutional: He is well-developed, well-nourished, and in no distress. No distress.  Cardiovascular: Normal rate, regular rhythm, normal heart sounds and intact distal pulses.  BKA on left noted.   Pulmonary/Chest: Breath sounds normal. No respiratory distress. He has no wheezes. He has no rales. He exhibits no tenderness.  Abdominal: Soft. Bowel sounds are normal.  Musculoskeletal:  Left hip - VAC site appears clean, dry and with good suction. There is no drainage currently.   Neurological: He is alert.    Lab Results Lab Results  Component Value Date   WBC 23.0 (H) 03/11/2017   HGB 8.8 (L) 03/11/2017   HCT 28.9 (L) 03/11/2017   MCV  82.8 03/11/2017   PLT 467 (H) 03/11/2017    Lab Results  Component Value Date   CREATININE 0.90 03/11/2017   BUN 12 03/11/2017   NA 135 03/11/2017   K 3.7 03/11/2017   CL 96 (L) 03/11/2017   CO2 30 03/11/2017    Lab Results  Component Value Date   ALT 10 (L) 03/06/2017   AST 13 (L) 03/06/2017   ALKPHOS 188 (H) 03/06/2017   BILITOT 1.2 03/06/2017     Microbiology: Recent Results (from the past 240 hour(s))  Urine culture     Status: Abnormal   Collection Time: 03/04/17  5:44 AM  Result Value Ref Range Status   Specimen Description   Final    URINE, CATHETERIZED Performed at [facility], [address] [place], [place] [address]    Special  Requests   Final    NONE Performed at [facility], [address] [place], [place] [address]    Culture (A)  Final    20,000 COLONIES/mL METHICILLIN RESISTANT STAPHYLOCOCCUS AUREUS   Report Status 03/06/2017 FINAL  Final   Organism ID, Bacteria METHICILLIN RESISTANT STAPHYLOCOCCUS AUREUS (A)  Final      Susceptibility   Methicillin resistant staphylococcus aureus - MIC*    CIPROFLOXACIN >=8 RESISTANT Resistant     GENTAMICIN >=16 RESISTANT Resistant     NITROFURANTOIN <=16 SENSITIVE Sensitive     OXACILLIN >=4 RESISTANT Resistant     TETRACYCLINE <=1 SENSITIVE Sensitive     VANCOMYCIN 1 SENSITIVE Sensitive     TRIMETH/SULFA <=10 SENSITIVE Sensitive     CLINDAMYCIN >=8 RESISTANT Resistant     RIFAMPIN <=0.5 SENSITIVE Sensitive     Inducible Clindamycin NEGATIVE Sensitive     * 20,000 COLONIES/mL METHICILLIN RESISTANT STAPHYLOCOCCUS AUREUS  Culture, blood (routine x 2)     Status: Abnormal   Collection Time: 03/04/17  6:03 AM  Result Value Ref Range Status   Specimen Description   Final    BLOOD RIGHT ARM Performed at [facility], [address] [place], [place] [address]    Special Requests   Final    BOTTLES DRAWN AEROBIC AND ANAEROBIC Blood Culture adequate volume Performed at [facility], [address], [place], [place] [address]    Culture  Setup Time   Final    GRAM POSITIVE COCCI ANAEROBIC BOTTLE ONLY Gram Stain Report Called to,Read Back By and Verified With: MURPHY,N @0545  ON 3.1.19 BY BOWMAN,L Performed at [facility], [address], [place], [place] [address]    Culture METHICILLIN RESISTANT STAPHYLOCOCCUS AUREUS (A)  Final   Report Status 03/07/2017 FINAL  Final   Organism ID, Bacteria METHICILLIN RESISTANT STAPHYLOCOCCUS AUREUS  Final      Susceptibility   Methicillin resistant staphylococcus aureus - MIC*    CIPROFLOXACIN >=8 RESISTANT Resistant     ERYTHROMYCIN >=8 RESISTANT Resistant     GENTAMICIN >=16 RESISTANT Resistant     OXACILLIN >=4 RESISTANT  Resistant     TETRACYCLINE <=1 SENSITIVE Sensitive     VANCOMYCIN <=0.5 SENSITIVE Sensitive     TRIMETH/SULFA <=10 SENSITIVE Sensitive     CLINDAMYCIN >=8 RESISTANT Resistant     RIFAMPIN <=0.5 SENSITIVE Sensitive     Inducible Clindamycin NEGATIVE Sensitive     * METHICILLIN RESISTANT STAPHYLOCOCCUS AUREUS  Culture, blood (routine x 2)     Status: Abnormal   Collection Time: 03/04/17  6:05 AM  Result Value Ref Range Status   Specimen Description   Final    BLOOD RIGHT  FOREARM DRAWN BY RN Performed at [facility], [address] [place], [place] [address]    Special Requests   Final    BOTTLES DRAWN AEROBIC AND ANAEROBIC Blood Culture adequate volume Performed at [facility], [address], [place], [place] [address]    Culture  Setup Time   Final    GRAM POSITIVE COCCI IN BOTH AEROBIC AND ANAEROBIC BOTTLES Gram Stain Report Called to,Read Back By and Verified With: MURPHY,N @ 6599 ON 3.1.19 BY BOWMAN,L    Culture (A)  Final    STAPHYLOCOCCUS SPECIES (COAGULASE NEGATIVE) THE SIGNIFICANCE OF ISOLATING THIS ORGANISM FROM A SINGLE SET OF BLOOD CULTURES WHEN MULTIPLE SETS ARE DRAWN IS UNCERTAIN. PLEASE NOTIFY THE MICROBIOLOGY DEPARTMENT WITHIN ONE WEEK IF SPECIATION AND SENSITIVITIES ARE REQUIRED. Performed at [facility], [place] [address] [place], [place] [address]    Report Status 03/07/2017 FINAL  Final  Blood Culture ID Panel (Reflexed)     Status: Abnormal   Collection Time: 03/04/17  6:05 AM  Result Value Ref Range Status   Enterococcus species NOT DETECTED NOT DETECTED Final   Listeria monocytogenes NOT DETECTED NOT DETECTED Final   Staphylococcus species DETECTED (A) NOT DETECTED Final    Comment: Methicillin (oxacillin) resistant coagulase negative staphylococcus. Possible blood culture contaminant (unless isolated from more than one blood culture draw or clinical case suggests pathogenicity). No antibiotic treatment is indicated for blood  culture  contaminants. CRITICAL RESULT CALLED TO, READ BACK BY AND VERIFIED WITH: M. Radford Pax Pharm.D. 9:40 03/05/17 ([email or web address])    Staphylococcus aureus NOT DETECTED NOT DETECTED Final   Methicillin resistance DETECTED (A) NOT DETECTED Final    Comment: CRITICAL RESULT CALLED TO, READ BACK BY AND VERIFIED WITH: M.  Radford Pax Pharm.D. 9:40 03/05/17 ([email or web address])    Streptococcus species NOT DETECTED NOT DETECTED Final   Streptococcus agalactiae NOT DETECTED NOT DETECTED Final   Streptococcus pneumoniae NOT DETECTED NOT DETECTED Final   Streptococcus pyogenes NOT DETECTED NOT DETECTED Final   Acinetobacter baumannii NOT DETECTED NOT DETECTED Final   Enterobacteriaceae species NOT DETECTED NOT DETECTED Final   Enterobacter cloacae complex NOT DETECTED NOT DETECTED Final   Escherichia coli NOT DETECTED NOT DETECTED Final   Klebsiella oxytoca NOT DETECTED NOT DETECTED Final   Klebsiella pneumoniae NOT DETECTED NOT DETECTED Final   Proteus species NOT DETECTED NOT DETECTED Final   Serratia marcescens NOT DETECTED NOT DETECTED Final   Haemophilus influenzae NOT DETECTED NOT DETECTED Final   Neisseria meningitidis NOT DETECTED NOT DETECTED Final   Pseudomonas aeruginosa NOT DETECTED NOT DETECTED Final   Candida albicans NOT DETECTED NOT DETECTED Final   Candida glabrata NOT DETECTED NOT DETECTED Final   Candida krusei NOT DETECTED NOT DETECTED Final   Candida parapsilosis NOT DETECTED NOT DETECTED Final   Candida tropicalis NOT DETECTED NOT DETECTED Final    Comment: Performed at [facility], [place] [address], [place], [place] [address]  Aerobic Culture (superficial specimen)     Status: None   Collection Time: 03/04/17  8:48 AM  Result Value Ref Range Status   Specimen Description   Final    HIP LEFT Performed at [facility], [address], [place], [place] [address]    Special Requests   Final    Immunocompromised Performed at [facility], [address] [place], [place] [address]    Gram Stain    Final    RARE WBC PRESENT, PREDOMINANTLY PMN FEW GRAM POSITIVE COCCI IN PAIRS    Culture  Final    ABUNDANT METHICILLIN RESISTANT STAPHYLOCOCCUS AUREUS ABUNDANT DIPHTHEROIDS(CORYNEBACTERIUM SPECIES) Standardized susceptibility testing for this organism is not available. Performed at [facility], [place] [address] [place], [place] [address]    Report Status 03/07/2017 FINAL  Final   Organism ID, Bacteria METHICILLIN RESISTANT STAPHYLOCOCCUS AUREUS  Final      Susceptibility   Methicillin resistant staphylococcus aureus - MIC*    CIPROFLOXACIN >=8 RESISTANT Resistant     ERYTHROMYCIN >=8 RESISTANT Resistant     GENTAMICIN >=16 RESISTANT Resistant     OXACILLIN >=4 RESISTANT Resistant     TETRACYCLINE <=1 SENSITIVE Sensitive     VANCOMYCIN <=0.5 SENSITIVE Sensitive     TRIMETH/SULFA <=10 SENSITIVE Sensitive     CLINDAMYCIN >=8 RESISTANT Resistant     RIFAMPIN <=0.5 SENSITIVE Sensitive     Inducible Clindamycin NEGATIVE Sensitive     * ABUNDANT METHICILLIN RESISTANT STAPHYLOCOCCUS AUREUS  Aerobic Culture (superficial specimen)     Status: None   Collection Time: 03/05/17 11:42 AM  Result Value Ref Range Status   Specimen Description WOUND LEFT HIP  Final   Special Requests PATIENT ON FOLLOWING ZOSYN GENTAMYCIN  Final   Gram Stain   Final    FEW WBC PRESENT,BOTH PMN AND MONONUCLEAR FEW GRAM POSITIVE COCCI IN CLUSTERS Performed at [facility], [place] [address], [place], [place] [address]    Culture   Final    MODERATE METHICILLIN RESISTANT STAPHYLOCOCCUS AUREUS   Report Status 03/07/2017 FINAL  Final   Organism ID, Bacteria METHICILLIN RESISTANT STAPHYLOCOCCUS AUREUS  Final      Susceptibility   Methicillin resistant staphylococcus aureus - MIC*    CIPROFLOXACIN >=8 RESISTANT Resistant     ERYTHROMYCIN >=8 RESISTANT Resistant     GENTAMICIN <=0.5 SENSITIVE Sensitive     OXACILLIN >=4 RESISTANT Resistant     TETRACYCLINE <=1 SENSITIVE Sensitive     VANCOMYCIN 1  SENSITIVE Sensitive     TRIMETH/SULFA <=10 SENSITIVE Sensitive     CLINDAMYCIN >=8 RESISTANT Resistant     RIFAMPIN <=0.5 SENSITIVE Sensitive     Inducible Clindamycin NEGATIVE Sensitive     * MODERATE METHICILLIN RESISTANT STAPHYLOCOCCUS AUREUS  Culture, blood (routine x 2)     Status: None (Preliminary result)   Collection Time: 03/06/17  6:32 AM  Result Value Ref Range Status   Specimen Description BLOOD LEFT ANTECUBITAL  Final   Special Requests IN PEDIATRIC BOTTLE Blood Culture adequate volume  Final   Culture   Final    NO GROWTH 4 DAYS Performed at [facility], [place] [address], [place], [place] [address]    Report Status PENDING  Incomplete  Culture, blood (routine x 2)     Status: None (Preliminary result)   Collection Time: 03/06/17  6:38 AM  Result Value Ref Range Status   Specimen Description BLOOD LEFT ARM  Final   Special Requests   Final    IN PEDIATRIC BOTTLE Blood Culture results may not be optimal due to an excessive volume of blood received in culture bottles   Culture   Final    NO GROWTH 4 DAYS Performed at [facility], [place] [address], [place], [place] [address]    Report Status PENDING  Incomplete     Terri Piedra, NP [place] for [place] Pager  03/11/2017  9:48 AM

## 2017-03-11 NOTE — Progress Notes (Signed)
Subjective: Patient doing well.  C/o of some left hip pain.     Objective: Vital signs in last 24 hours: Temp:  [98 F (36.7 C)-98.4 F (36.9 C)] 98.4 F (36.9 C) (03/07 0548) Pulse Rate:  [77-98] 88 (03/07 0548) Resp:  [0-16] 16 (03/07 0548) BP: (125-168)/(62-94) 168/82 (03/07 0548) SpO2:  [96 %-99 %] 99 % (03/07 0548) Weight:  [163 lb 2.3 oz (74 kg)] 163 lb 2.3 oz (74 kg) (03/06 2031)  Intake/Output from previous day: 03/06 0701 - 03/07 0700 In: 2713.8 [P.O.:120; I.V.:2193.8] Out: 420 [Urine:400; Blood:20] Intake/Output this shift: No intake/output data recorded.  Recent Labs    03/08/17 1545 03/09/17 0747 03/10/17 1050  HGB 8.4* 8.9* 8.7*   Recent Labs    03/09/17 0747 03/10/17 1050  WBC 21.6* 19.4*  RBC 3.50* 3.48*  HCT 28.6* 28.4*  PLT 457* 442*   Recent Labs    03/10/17 1050 03/11/17 0530  NA 134* 135  K 3.5 3.7  CL 98* 96*  CO2 29 30  BUN 13 12  CREATININE 0.87 0.90  GLUCOSE 196* 149*  CALCIUM 10.8* 10.9*   No results for input(s): LABPT, INR in the last 72 hours.  Exam: Patient appears comfortable.  Wound vac intact.    Assessment/Plan: Continue present care.     [name] 03/11/2017, 8:41 AM

## 2017-03-11 NOTE — Anesthesia Postprocedure Evaluation (Signed)
Anesthesia Post Note  Patient: [name]  Procedure(s) Performed: REPEAT IRRIGATION AND DEBRIDEMENT LEFT DEEP HIP INFECTION, ANTIBIOTIC BEAD PLACEMENT, VAC PLACEMENT (Left Hip)     Patient location during evaluation: PACU Anesthesia Type: General Level of consciousness: awake and alert Pain management: pain level controlled Vital Signs Assessment: post-procedure vital signs reviewed and stable Respiratory status: spontaneous breathing, nonlabored ventilation and respiratory function stable Cardiovascular status: blood pressure returned to baseline and stable Postop Assessment: no apparent nausea or vomiting Anesthetic complications: no    Last Vitals:  Vitals:   03/11/17 0001 03/11/17 0548  BP: (!) 156/88 (!) 168/82  Pulse: 84 88  Resp: 16 16  Temp: 36.7 C 36.9 C  SpO2: 97% 99%    Last Pain:  Vitals:   03/11/17 0548  TempSrc: Oral  PainSc: 6                  Janie Capp,W. EDMOND

## 2017-03-12 DIAGNOSIS — Z95828 Presence of other vascular implants and grafts: Secondary | ICD-10-CM

## 2017-03-12 DIAGNOSIS — T8149XA Infection following a procedure, other surgical site, initial encounter: Secondary | ICD-10-CM

## 2017-03-12 LAB — GLUCOSE, CAPILLARY
GLUCOSE-CAPILLARY: 106 mg/dL — AB (ref 65–99)
GLUCOSE-CAPILLARY: 200 mg/dL — AB (ref 65–99)
Glucose-Capillary: 122 mg/dL — ABNORMAL HIGH (ref 65–99)
Glucose-Capillary: 158 mg/dL — ABNORMAL HIGH (ref 65–99)

## 2017-03-12 LAB — BASIC METABOLIC PANEL
ANION GAP: 11 (ref 5–15)
BUN: 15 mg/dL (ref 6–20)
CALCIUM: 10.8 mg/dL — AB (ref 8.9–10.3)
CO2: 28 mmol/L (ref 22–32)
Chloride: 95 mmol/L — ABNORMAL LOW (ref 101–111)
Creatinine, Ser: 0.95 mg/dL (ref 0.61–1.24)
Glucose, Bld: 155 mg/dL — ABNORMAL HIGH (ref 65–99)
Potassium: 3.6 mmol/L (ref 3.5–5.1)
Sodium: 134 mmol/L — ABNORMAL LOW (ref 135–145)

## 2017-03-12 LAB — CBC WITH DIFFERENTIAL/PLATELET
BASOS ABS: 0 10*3/uL (ref 0.0–0.1)
BASOS PCT: 0 %
Eosinophils Absolute: 0.3 10*3/uL (ref 0.0–0.7)
Eosinophils Relative: 2 %
HEMATOCRIT: 25.1 % — AB (ref 39.0–52.0)
Hemoglobin: 7.9 g/dL — ABNORMAL LOW (ref 13.0–17.0)
Lymphocytes Relative: 6 %
Lymphs Abs: 1.1 10*3/uL (ref 0.7–4.0)
MCH: 26.2 pg (ref 26.0–34.0)
MCHC: 31.5 g/dL (ref 30.0–36.0)
MCV: 83.1 fL (ref 78.0–100.0)
MONO ABS: 1.2 10*3/uL — AB (ref 0.1–1.0)
Monocytes Relative: 7 %
NEUTROS ABS: 15.9 10*3/uL — AB (ref 1.7–7.7)
Neutrophils Relative %: 85 %
PLATELETS: 361 10*3/uL (ref 150–400)
RBC: 3.02 MIL/uL — ABNORMAL LOW (ref 4.22–5.81)
RDW: 17 % — AB (ref 11.5–15.5)
WBC: 18.6 10*3/uL — AB (ref 4.0–10.5)

## 2017-03-12 MED ORDER — ENSURE ENLIVE PO LIQD
237.0000 mL | Freq: Three times a day (TID) | ORAL | Status: DC
  Administered 2017-03-12 – 2017-03-19 (×19): 237 mL via ORAL

## 2017-03-12 NOTE — NC FL2 (Signed)
[place] MEDICAID FL2 LEVEL OF CARE SCREENING TOOL     IDENTIFICATION  Patient Name: [name] Birthdate: 18-Jan-1931 Sex: male Admission Date (Current Location): 03/04/2017  [facility] and [place] Number:  Herbalist and Address:  [facility] [place], [place] [address], [place], [place] [address]      Provider Number: [id number]  Attending Physician Name and Address:  Alma Friendly, MD  Relative Name and Phone Number:       Current Level of Care: Hospital Recommended Level of Care: [place] Prior Approval Number:    Date Approved/Denied: 03/12/17 PASRR Number: [id number]  Discharge Plan: SNF    Current Diagnoses: Patient Active Problem List   Diagnosis Date Noted  . Sepsis ([place]) 03/04/2017  . Wound infection after surgery 03/04/2017  . Acute delirium 03/04/2017  . Closed left hip fracture ([place]) 02/22/2017  . GERD (gastroesophageal reflux disease) 02/22/2017  . CKD (chronic kidney disease), stage III ([place]) 02/22/2017  . Hyperglycemia 02/22/2017  . Anemia in chronic kidney disease (CKD) 02/22/2017  . Hypercalcemia 02/22/2017  . Moderate dehydration 02/22/2017  . Fall as cause of accidental injury at home as place of occurrence 02/22/2017  . S/P BKA (below knee amputation) unilateral, left ([place]) 02/22/2017  . Bilateral hip pain 02/22/2017  . Pyuria 02/22/2017  . Hyperbilirubinemia 02/22/2017  . UTI (urinary tract infection) 11/13/2016  . Cellulitis and abscess of foot   . COPD (chronic obstructive pulmonary disease) ([place]) 09/09/2016  . CAD (coronary artery disease) 09/09/2016  . Hyperlipidemia 09/09/2016  . Type 2 diabetes mellitus ([place]) 07/24/2016  . Pressure injury of skin 06/11/2016  . Atrial fibrillation with normal ventricular rate ([place]) 06/10/2016  . Esophageal stricture 06/09/2016  . CAD in native artery 06/08/2016  . Acute renal failure superimposed on stage 3 chronic kidney disease ([place]) 06/08/2016  .  Chronic diastolic CHF (congestive heart failure) ([place]) 03/19/2016  . Abnormal weight loss   . Thrush, oral 12/18/2013  . Chest pain at rest 12/18/2013  . Leukocytosis 12/18/2013  . Diabetes ([place]) 12/18/2013  . Chronic back pain 12/18/2013  . Dysphagia 12/18/2013  . Odynophagia 12/18/2013  . Gastric mass 09/07/2013  . Personal history of colonic polyps 08/05/2013  . Nausea and vomiting 08/01/2013  . UTI (lower urinary tract infection) 12/09/2012  . Lower extremity edema 06/02/2012  . Essential hypertension 06/02/2012  . History of DVT (deep vein thrombosis) 06/02/2012  . Obesity (BMI 30-39.9): BMI 31.3 06/02/2012    Orientation RESPIRATION BLADDER Height & Weight     Self, Place  Normal Incontinent Weight: 163 lb 9.3 oz (74.2 kg) Height:  5\' 7"  (170.2 cm)  BEHAVIORAL SYMPTOMS/MOOD NEUROLOGICAL BOWEL NUTRITION STATUS      Continent Diet(Carb modified, thin liquids)  AMBULATORY STATUS COMMUNICATION OF NEEDS Skin   Extensive Assist Verbally Wound Vac(Right Hip, negative pressure wound therapy, 100 mmHg)                       Personal Care Assistance Level of Assistance  Bathing, Dressing Bathing Assistance: Maximum assistance   Dressing Assistance: Maximum assistance     Functional Limitations Info  Sight, Hearing, Speech Sight Info: Adequate Hearing Info: Adequate Speech Info: Adequate    SPECIAL CARE FACTORS FREQUENCY  PT (By licensed PT), OT (By licensed OT)     PT Frequency: 2x OT Frequency: 2X            Contractures Contractures Info: Not present  Additional Factors Info  Code Status, Allergies Code Status Info: Partial Allergies Info: NO known allergies Psychotropic Info: prozac 20 mg capsule         Current Medications (03/12/2017):  This is the current hospital active medication list Current Facility-Administered Medications  Medication Dose Route Frequency Provider Last Rate Last Dose  . acetaminophen (TYLENOL) tablet 650 mg  650 mg Oral  Q6H PRN Johnson, Clanford L, MD   650 mg at 03/12/17 1037   Or  . acetaminophen (TYLENOL) suppository 650 mg  650 mg Rectal Q6H PRN Johnson, Clanford L, MD      . amLODipine (NORVASC) tablet 10 mg  10 mg Oral Daily [place] Friendly, MD   10 mg at 03/12/17 1037  . apixaban (ELIQUIS) tablet 5 mg  5 mg Oral BID Lanae Crumbly, PA-C   5 mg at 03/12/17 1038  . cloNIDine (CATAPRES) tablet 0.1 mg  0.1 mg Oral QHS Alma Friendly, MD   0.1 mg at 03/11/17 2108  . docusate sodium (COLACE) capsule 100 mg  100 mg Oral BID Lanae Crumbly, PA-C   100 mg at 03/12/17 1037  . feeding supplement (ENSURE ENLIVE) (ENSURE ENLIVE) liquid 237 mL  237 mL Oral BID BM Regalado, Belkys A, MD   237 mL at 03/12/17 1036  . finasteride (PROSCAR) tablet 5 mg  5 mg Oral Daily Johnson, Clanford L, MD   5 mg at 03/12/17 1036  . FLUoxetine (PROZAC) capsule 20 mg  20 mg Oral Daily Johnson, Clanford L, MD   20 mg at 03/12/17 1037  . HYDROcodone-acetaminophen (NORCO) 7.5-325 MG per tablet 1 tablet  1 tablet Oral Q6H PRN Murlean Iba, MD   1 tablet at 03/11/17 2108  . insulin aspart (novoLOG) injection 0-15 Units  0-15 Units Subcutaneous TID WC Regalado, Belkys A, MD   2 Units at 03/12/17 0816  . insulin glargine (LANTUS) injection 10 Units  10 Units Subcutaneous QHS Irwin Brakeman L, MD   10 Units at 03/11/17 2107  . ipratropium-albuterol (DUONEB) 0.5-2.5 (3) MG/3ML nebulizer solution 3 mL  3 mL Nebulization Q6H PRN Johnson, Clanford L, MD      . LORazepam (ATIVAN) tablet 1 mg  1 mg Oral Q8H PRN Wynetta Emery, Clanford L, MD   1 mg at 03/11/17 2108  . menthol-cetylpyridinium (CEPACOL) lozenge 3 mg  1 lozenge Oral PRN Lanae Crumbly, PA-C       Or  . phenol (CHLORASEPTIC) mouth spray 1 spray  1 spray Mouth/Throat PRN Lanae Crumbly, PA-C      . metoCLOPramide (REGLAN) tablet 5-10 mg  5-10 mg Oral Q8H PRN Lanae Crumbly, PA-C   10 mg at 03/11/17 2107   Or  . metoCLOPramide (REGLAN) injection 5-10 mg  5-10 mg Intravenous Q8H  PRN Lanae Crumbly, PA-C      . metoprolol succinate (TOPROL-XL) 24 hr tablet 25 mg  25 mg Oral Daily Johnson, Clanford L, MD   25 mg at 03/12/17 1037  . nitroGLYCERIN (NITROSTAT) SL tablet 0.4 mg  0.4 mg Sublingual Q5 min PRN Wynetta Emery, Clanford L, MD      . ondansetron (ZOFRAN) tablet 4 mg  4 mg Oral Q6H PRN Lanae Crumbly, PA-C   4 mg at 03/09/17 2133   Or  . ondansetron (ZOFRAN) injection 4 mg  4 mg Intravenous Q6H PRN Lanae Crumbly, PA-C      . pantoprazole (PROTONIX) EC tablet 40 mg  40 mg Oral Daily [place] Emery,  Clanford L, MD   40 mg at 03/12/17 1036  . senna-docusate (Senokot-S) tablet 1 tablet  1 tablet Oral BID Regalado, Belkys A, MD   1 tablet at 03/12/17 1038  . sodium chloride flush (NS) 0.9 % injection 10-40 mL  10-40 mL Intracatheter PRN [place] Friendly, MD   20 mL at 03/12/17 0405  . tamsulosin (FLOMAX) capsule 0.4 mg  0.4 mg Oral Daily Johnson, Clanford L, MD   0.4 mg at 03/12/17 1038  . vancomycin (VANCOCIN) IVPB 1000 mg/200 mL premix  1,000 mg Intravenous Q24H [name], [facility]   Stopped at 03/12/17 1340     Discharge Medications: Please see discharge summary for a list of discharge medications.  Relevant Imaging Results:  Relevant Lab Results:   Additional Information SSN: [id number]  Eileen Stanford, LCSW

## 2017-03-12 NOTE — Progress Notes (Signed)
Subjective: Doing well.  pain controlled.     Objective: Vital signs in last 24 hours: Temp:  [97.5 F (36.4 C)-98.9 F (37.2 C)] 97.5 F (36.4 C) (03/08 1006) Pulse Rate:  [70-94] 70 (03/08 1006) Resp:  [18-20] 18 (03/08 1006) BP: (119-143)/(59-73) 143/73 (03/08 1006) SpO2:  [98 %-100 %] 99 % (03/08 1006) Weight:  [163 lb 9.3 oz (74.2 kg)] 163 lb 9.3 oz (74.2 kg) (03/07 2125)  Intake/Output from previous day: 03/07 0701 - 03/08 0700 In: 1700 [P.O.:1500; IV Piggyback:200] Out: 1950 [Urine:1950] Intake/Output this shift: No intake/output data recorded.  Recent Labs    03/10/17 1050 03/11/17 0530 03/12/17 0357  HGB 8.7* 8.8* 7.9*   Recent Labs    03/11/17 0530 03/12/17 0357  WBC 23.0* 18.6*  RBC 3.49* 3.02*  HCT 28.9* 25.1*  PLT 467* 361   Recent Labs    03/11/17 0530 03/12/17 0357  NA 135 134*  K 3.7 3.6  CL 96* 95*  CO2 30 28  BUN 12 15  CREATININE 0.90 0.95  GLUCOSE 149* 155*  CALCIUM 10.9* 10.8*   No results for input(s): LABPT, INR in the last 72 hours.  Exam:  Incisional wound vac on.  No output.    Assessment/Plan: Continue present care.     [name] 03/12/2017, 3:43 PM

## 2017-03-12 NOTE — Progress Notes (Signed)
PT Cancellation Note  Patient Details Name: [name] MRN: [id number] DOB: 1931/05/18   Cancelled Treatment:    Reason Eval/Treat Not Completed: Other (comment);Patient declined, no reason specified. Chart reviewed, RN consulted. PT evaluation attempted, however patient reporting multiple times from upon entry that he is unable to participate. Pt very anxious about working with PT at this time saying "please don't hurt me"  Multiple times. Pt given emotional support, encouraged to participate within reasonable limits, and given assurance of gentleness of mobility in his current state. He attempts to assist in AA/ROM of the LLE but begins to cry, asking PT to stop. PT immediately ending session. Discussed with RN immediately. Pt oriented to self, location, and [employer], and the year 1986, which per EMR is suspected to be near his baseline.   Will continue to follow acutely and attempt PT evaluation again when patient is better able to participate.   4:54 PM, 03/12/17 Etta Grandchild, PT, DPT Physical Therapist - [place] [phone] (Pager)  [phone] (Office)      [name],[name] 03/12/2017, 4:50 PM

## 2017-03-12 NOTE — Progress Notes (Signed)
PROGRESS NOTE  [name] PPJ:[id number] DOB: [birth year] DOA: 03/04/2017 PCP: [name], MD  HPI/Recap of past 24 hours: [name] a 84 y.o.malefrom home where he was found in his bed with a lot of bloody drainage from around his surgical site where he had a left hip surgery done on February 20 by Dr. Lorin Mercy. Admitted for acute metabolic encephalopathy and sepsis 2/2 to surgical wound infection. Patient admitted with left Hemiarthroplasty infection, underwent I and D left hip and placement of prostalac absorbable vancomycin and gentamycin beads on 3-01 by Dr Lorin Mercy. Culture from wound grew MRSA. He was also diagnosed with MRSA Bacteremia. ID has been following patient in consultation. Plan for 6 Weeks of IV antibiotics. Repeat blood culture negative to date. Patient noted to have significant drainage form left hip. Dr Lorin Mercy performed a repeat I&D on 03/10/17.  Today, pt reported no new complaints, denies chest pain, SOB, abdominal pain, fever/chills. Looks comfortable.  Assessment/Plan: Principal Problem:   Wound infection after surgery Active Problems:   Leukocytosis   Chronic diastolic CHF (congestive heart failure) (HCC)   Acute renal failure superimposed on stage 3 chronic kidney disease (HCC)   Atrial fibrillation with normal ventricular rate (HCC)   CAD (coronary artery disease)   Anemia in chronic kidney disease (CKD)   Sepsis (HCC)   Acute delirium   Hemiarthroplasty left hip MRSA infection Afebrile, with downtrending leukocytosis Recent left hip surgery 02/24/17 Underwent I and D left hip and placement of prostalac absorbable vancomycin and gentamycin beads on 3-01 and 3/6 ID following, continue with vancomycin. Needs 6 weeks of IV antibiotics (till 04/22/17) Cultures from wound; Moderate staph aureus Ortho on board  MRSA Bacteremia/Sepsis Blood culture on 3/2 NGTD On IV antibiotics.  ECHO, no evidence of vegetation Needs 6 weeks of IV antibiotics,  follow by 3-6 months of oral therapy (Bactrim or Doxy, and likely long term suppression after, per ID recommendations PICC line placed  Acute metabolic encephalopathy Mental status fluctuates. ??dementia Monitor closely  AKI Resolved s/p IVF  Mild hypercalcemia Monitor closely  Chronic A Fib Rate controlled Continue with metoprolol, eliquis  HTN Stable Continue home norvasc, clonidine  Normocytic anemia monitor hgb, currently stable   DM type 2 lantus 10 units, SSI.   Depression/anxiety Prozac  BPH Finasteride, tamsulosin   Code Status: Partial code  Family Communication: None at bedside  Disposition Plan: SNF    Consultants:  Orthopedics  ID  Procedures:  S/P I&D   Antimicrobials:  IV Vancomycin  DVT prophylaxis:  Eliquis   Objective: Vitals:   03/11/17 1705 03/11/17 2125 03/12/17 0618 03/12/17 1006  BP: (!) 139/59 128/64 119/67 (!) 143/73  Pulse: 70 78 94 70  Resp: 18 20 19 18   Temp: 98.2 F (36.8 C) 98.9 F (37.2 C) 98.4 F (36.9 C) (!) 97.5 F (36.4 C)  TempSrc: Oral Oral Oral Oral  SpO2: 98% 99% 100% 99%  Weight:  74.2 kg (163 lb 9.3 oz)    Height:        Intake/Output Summary (Last 24 hours) at 03/12/2017 1454 Last data filed at 03/12/2017 0815 Gross per 24 hour  Intake 1020 ml  Output 1300 ml  Net -280 ml   Filed Weights   03/07/17 2100 03/10/17 2031 03/11/17 2125  Weight: 77.7 kg (171 lb 4.8 oz) 74 kg (163 lb 2.3 oz) 74.2 kg (163 lb 9.3 oz)    Exam:   General:  NAD  Cardiovascular: S1, S2 present  Respiratory: CTA  Abdomen: Soft, NT, ND, BS+  Musculoskeletal: L BKA, tenderness over L hip, VAC in place  Skin: Normal  Psychiatry: Normal mood   Data Reviewed: CBC: Recent Labs  Lab 03/06/17 0622 03/07/17 0522 03/08/17 1545 03/09/17 0747 03/10/17 1050 03/11/17 0530 03/12/17 0357  WBC 24.2* 20.4* 19.2* 21.6* 19.4* 23.0* 18.6*  NEUTROABS 21.3* 18.2*  --   --  17.3* 20.3* 15.9*  HGB 8.6* 8.7*  8.4* 8.9* 8.7* 8.8* 7.9*  HCT 27.7* 27.5* 27.4* 28.6* 28.4* 28.9* 25.1*  MCV 81.7 82.6 81.5 81.7 81.6 82.8 83.1  PLT 449* 452* 498* 457* 442* 467* 616   Basic Metabolic Panel: Recent Labs  Lab 03/06/17 0622  03/09/17 0747 03/10/17 0541 03/10/17 1050 03/11/17 0530 03/12/17 0357  NA 134*   < > 134* 134* 134* 135 134*  K 3.5   < > 3.5 3.4* 3.5 3.7 3.6  CL 100*   < > 100* 97* 98* 96* 95*  CO2 25   < > 26 29 29 30 28   GLUCOSE 182*   < > 208* 199* 196* 149* 155*  BUN 19   < > 10 15 13 12 15   CREATININE 1.06   < > 0.85 0.92 0.87 0.90 0.95  CALCIUM 10.6*   < > 10.7* 10.7* 10.8* 10.9* 10.8*  MG 1.9  --   --   --   --   --   --    < > = values in this interval not displayed.   GFR: Estimated Creatinine Clearance: 55.1 mL/min (by C-G formula based on SCr of 0.95 mg/dL). Liver Function Tests: Recent Labs  Lab 03/06/17 0622  AST 13*  ALT 10*  ALKPHOS 188*  BILITOT 1.2  PROT 5.3*  ALBUMIN 2.0*   No results for input(s): LIPASE, AMYLASE in the last 168 hours. No results for input(s): AMMONIA in the last 168 hours. Coagulation Profile: No results for input(s): INR, PROTIME in the last 168 hours. Cardiac Enzymes: No results for input(s): CKTOTAL, CKMB, CKMBINDEX, TROPONINI in the last 168 hours. BNP (last 3 results) No results for input(s): PROBNP in the last 8760 hours. HbA1C: No results for input(s): HGBA1C in the last 72 hours. CBG: Recent Labs  Lab 03/11/17 1158 03/11/17 1610 03/11/17 2124 03/12/17 0730 03/12/17 1156  GLUCAP 151* 104* 107* 122* 106*   Lipid Profile: No results for input(s): CHOL, HDL, LDLCALC, TRIG, CHOLHDL, LDLDIRECT in the last 72 hours. Thyroid Function Tests: No results for input(s): TSH, T4TOTAL, FREET4, T3FREE, THYROIDAB in the last 72 hours. Anemia Panel: No results for input(s): VITAMINB12, FOLATE, FERRITIN, TIBC, IRON, RETICCTPCT in the last 72 hours. Urine analysis:    Component Value Date/Time   COLORURINE YELLOW 03/04/2017 0544    APPEARANCEUR HAZY (A) 03/04/2017 0544   APPEARANCEUR Cloudy (A) 11/12/2016 1101   LABSPEC 1.012 03/04/2017 0544   PHURINE 5.0 03/04/2017 0544   GLUCOSEU 50 (A) 03/04/2017 0544   HGBUR SMALL (A) 03/04/2017 0544   BILIRUBINUR NEGATIVE 03/04/2017 0544   BILIRUBINUR Negative 11/12/2016 1101   KETONESUR 5 (A) 03/04/2017 0544   PROTEINUR 100 (A) 03/04/2017 0544   UROBILINOGEN 0.2 12/19/2013 1905   NITRITE NEGATIVE 03/04/2017 0544   LEUKOCYTESUR NEGATIVE 03/04/2017 0544   LEUKOCYTESUR 2+ (A) 11/12/2016 1101   Sepsis Labs: @LABRCNTIP (procalcitonin:4,lacticidven:4)  ) Recent Results (from the past 240 hour(s))  Urine culture     Status: Abnormal   Collection Time: 03/04/17  5:44 AM  Result Value Ref Range Status   Specimen Description   Final  URINE, CATHETERIZED Performed at [facility], [address] [place], [place] [address]    Special Requests   Final    NONE Performed at [facility], [address], [place], [place] [address]    Culture (A)  Final    20,000 COLONIES/mL METHICILLIN RESISTANT STAPHYLOCOCCUS AUREUS   Report Status 03/06/2017 FINAL  Final   Organism ID, Bacteria METHICILLIN RESISTANT STAPHYLOCOCCUS AUREUS (A)  Final      Susceptibility   Methicillin resistant staphylococcus aureus - MIC*    CIPROFLOXACIN >=8 RESISTANT Resistant     GENTAMICIN >=16 RESISTANT Resistant     NITROFURANTOIN <=16 SENSITIVE Sensitive     OXACILLIN >=4 RESISTANT Resistant     TETRACYCLINE <=1 SENSITIVE Sensitive     VANCOMYCIN 1 SENSITIVE Sensitive     TRIMETH/SULFA <=10 SENSITIVE Sensitive     CLINDAMYCIN >=8 RESISTANT Resistant     RIFAMPIN <=0.5 SENSITIVE Sensitive     Inducible Clindamycin NEGATIVE Sensitive     * 20,000 COLONIES/mL METHICILLIN RESISTANT STAPHYLOCOCCUS AUREUS  Culture, blood (routine x 2)     Status: Abnormal   Collection Time: 03/04/17  6:03 AM  Result Value Ref Range Status   Specimen Description   Final    BLOOD RIGHT ARM Performed at [facility], [address] [place], [place] [address]    Special Requests   Final    BOTTLES DRAWN AEROBIC AND ANAEROBIC Blood Culture adequate volume Performed at [facility], [address], [place], [place] [address]    Culture  Setup Time   Final    GRAM POSITIVE COCCI ANAEROBIC BOTTLE ONLY Gram Stain Report Called to,Read Back By and Verified With: MURPHY,N @0545  ON 3.1.19 BY BOWMAN,L Performed at [name], [address], [place], [place] [address]    Culture METHICILLIN RESISTANT STAPHYLOCOCCUS AUREUS (A)  Final   Report Status 03/07/2017 FINAL  Final   Organism ID, Bacteria METHICILLIN RESISTANT STAPHYLOCOCCUS AUREUS  Final      Susceptibility   Methicillin resistant staphylococcus aureus - MIC*    CIPROFLOXACIN >=8 RESISTANT Resistant     ERYTHROMYCIN >=8 RESISTANT Resistant     GENTAMICIN >=16 RESISTANT Resistant     OXACILLIN >=4 RESISTANT Resistant     TETRACYCLINE <=1 SENSITIVE Sensitive     VANCOMYCIN <=0.5 SENSITIVE Sensitive     TRIMETH/SULFA <=10 SENSITIVE Sensitive     CLINDAMYCIN >=8 RESISTANT Resistant     RIFAMPIN <=0.5 SENSITIVE Sensitive     Inducible Clindamycin NEGATIVE Sensitive     * METHICILLIN RESISTANT STAPHYLOCOCCUS AUREUS  Culture, blood (routine x 2)     Status: Abnormal   Collection Time: 03/04/17  6:05 AM  Result Value Ref Range Status   Specimen Description   Final    BLOOD RIGHT FOREARM DRAWN BY RN Performed at [facility], [address] [place], [place] [address]    Special Requests   Final    BOTTLES DRAWN AEROBIC AND ANAEROBIC Blood Culture adequate volume Performed at [facility], [address] [place], [place] [address]    Culture  Setup Time   Final    GRAM POSITIVE COCCI IN BOTH AEROBIC AND ANAEROBIC BOTTLES Gram Stain Report Called to,Read Back By and Verified With: MURPHY,N @ 7616 ON 3.1.19 BY BOWMAN,L    Culture (A)  Final    STAPHYLOCOCCUS SPECIES (COAGULASE NEGATIVE) THE SIGNIFICANCE OF ISOLATING THIS ORGANISM FROM A SINGLE  SET OF BLOOD CULTURES WHEN MULTIPLE SETS ARE DRAWN IS UNCERTAIN. PLEASE NOTIFY THE MICROBIOLOGY DEPARTMENT WITHIN  ONE WEEK IF SPECIATION AND SENSITIVITIES ARE REQUIRED. Performed at [facility], [place] [address], [place], [place] [address]    Report Status 03/07/2017 FINAL  Final  Blood Culture ID Panel (Reflexed)     Status: Abnormal   Collection Time: 03/04/17  6:05 AM  Result Value Ref Range Status   Enterococcus species NOT DETECTED NOT DETECTED Final   Listeria monocytogenes NOT DETECTED NOT DETECTED Final   Staphylococcus species DETECTED (A) NOT DETECTED Final    Comment: Methicillin (oxacillin) resistant coagulase negative staphylococcus. Possible blood culture contaminant (unless isolated from more than one blood culture draw or clinical case suggests pathogenicity). No antibiotic treatment is indicated for blood  culture contaminants. CRITICAL RESULT CALLED TO, READ BACK BY AND VERIFIED WITH: M. Radford Pax Pharm.D. 9:40 03/05/17 ([email or web address])    Staphylococcus aureus NOT DETECTED NOT DETECTED Final   Methicillin resistance DETECTED (A) NOT DETECTED Final    Comment: CRITICAL RESULT CALLED TO, READ BACK BY AND VERIFIED WITH: M.  Radford Pax Pharm.D. 9:40 03/05/17 ([email or web address])    Streptococcus species NOT DETECTED NOT DETECTED Final   Streptococcus agalactiae NOT DETECTED NOT DETECTED Final   Streptococcus pneumoniae NOT DETECTED NOT DETECTED Final   Streptococcus pyogenes NOT DETECTED NOT DETECTED Final   Acinetobacter baumannii NOT DETECTED NOT DETECTED Final   Enterobacteriaceae species NOT DETECTED NOT DETECTED Final   Enterobacter cloacae complex NOT DETECTED NOT DETECTED Final   Escherichia coli NOT DETECTED NOT DETECTED Final   Klebsiella oxytoca NOT DETECTED NOT DETECTED Final   Klebsiella pneumoniae NOT DETECTED NOT DETECTED Final   Proteus species NOT DETECTED NOT DETECTED Final   Serratia marcescens NOT DETECTED NOT DETECTED Final   Haemophilus influenzae NOT DETECTED NOT DETECTED  Final   Neisseria meningitidis NOT DETECTED NOT DETECTED Final   Pseudomonas aeruginosa NOT DETECTED NOT DETECTED Final   Candida albicans NOT DETECTED NOT DETECTED Final   Candida glabrata NOT DETECTED NOT DETECTED Final   Candida krusei NOT DETECTED NOT DETECTED Final   Candida parapsilosis NOT DETECTED NOT DETECTED Final   Candida tropicalis NOT DETECTED NOT DETECTED Final    Comment: Performed at [facility], [place] [address], [place], [place] [address]  Aerobic Culture (superficial specimen)     Status: None   Collection Time: 03/04/17  8:48 AM  Result Value Ref Range Status   Specimen Description   Final    HIP LEFT Performed at [facility], [address] [place], [place] [address]    Special Requests   Final    Immunocompromised Performed at [facility], [address], [place], [place] [address]    Gram Stain   Final    RARE WBC PRESENT, PREDOMINANTLY PMN FEW GRAM POSITIVE COCCI IN PAIRS    Culture   Final    ABUNDANT METHICILLIN RESISTANT STAPHYLOCOCCUS AUREUS ABUNDANT DIPHTHEROIDS(CORYNEBACTERIUM SPECIES) Standardized susceptibility testing for this organism is not available. Performed at [facility], [place] [address] [place],  [address]    Report Status 03/07/2017 FINAL  Final   Organism ID, Bacteria METHICILLIN RESISTANT STAPHYLOCOCCUS AUREUS  Final      Susceptibility   Methicillin resistant staphylococcus aureus - MIC*    CIPROFLOXACIN >=8 RESISTANT Resistant     ERYTHROMYCIN >=8 RESISTANT Resistant     GENTAMICIN >=16 RESISTANT Resistant     OXACILLIN >=4 RESISTANT Resistant     TETRACYCLINE <=1 SENSITIVE Sensitive     VANCOMYCIN <=0.5 SENSITIVE Sensitive     TRIMETH/SULFA <=10 SENSITIVE Sensitive  CLINDAMYCIN >=8 RESISTANT Resistant     RIFAMPIN <=0.5 SENSITIVE Sensitive     Inducible Clindamycin NEGATIVE Sensitive     * ABUNDANT METHICILLIN RESISTANT STAPHYLOCOCCUS AUREUS  Aerobic Culture (superficial specimen)     Status: None    Collection Time: 03/05/17 11:42 AM  Result Value Ref Range Status   Specimen Description WOUND LEFT HIP  Final   Special Requests PATIENT ON FOLLOWING ZOSYN GENTAMYCIN  Final   Gram Stain   Final    FEW WBC PRESENT,BOTH PMN AND MONONUCLEAR FEW GRAM POSITIVE COCCI IN CLUSTERS Performed at [facility], [place] [address] [place], [place] [address]    Culture   Final    MODERATE METHICILLIN RESISTANT STAPHYLOCOCCUS AUREUS   Report Status 03/07/2017 FINAL  Final   Organism ID, Bacteria METHICILLIN RESISTANT STAPHYLOCOCCUS AUREUS  Final      Susceptibility   Methicillin resistant staphylococcus aureus - MIC*    CIPROFLOXACIN >=8 RESISTANT Resistant     ERYTHROMYCIN >=8 RESISTANT Resistant     GENTAMICIN <=0.5 SENSITIVE Sensitive     OXACILLIN >=4 RESISTANT Resistant     TETRACYCLINE <=1 SENSITIVE Sensitive     VANCOMYCIN 1 SENSITIVE Sensitive     TRIMETH/SULFA <=10 SENSITIVE Sensitive     CLINDAMYCIN >=8 RESISTANT Resistant     RIFAMPIN <=0.5 SENSITIVE Sensitive     Inducible Clindamycin NEGATIVE Sensitive     * MODERATE METHICILLIN RESISTANT STAPHYLOCOCCUS AUREUS  Culture, blood (routine x 2)     Status: None   Collection Time: 03/06/17  6:32 AM  Result Value Ref Range Status   Specimen Description BLOOD LEFT ANTECUBITAL  Final   Special Requests IN PEDIATRIC BOTTLE Blood Culture adequate volume  Final   Culture   Final    NO GROWTH 5 DAYS Performed at [facility], [place] [address] [name], [place] [address]    Report Status 03/11/2017 FINAL  Final  Culture, blood (routine x 2)     Status: None   Collection Time: 03/06/17  6:38 AM  Result Value Ref Range Status   Specimen Description BLOOD LEFT ARM  Final   Special Requests   Final    IN PEDIATRIC BOTTLE Blood Culture results may not be optimal due to an excessive volume of blood received in culture bottles   Culture   Final    NO GROWTH 5 DAYS Performed at [facility], [place] [address], [place], [place]  [address]    Report Status 03/11/2017 FINAL  Final      Studies: No results found.  Scheduled Meds: . amLODipine  10 mg Oral Daily  . apixaban  5 mg Oral BID  . cloNIDine  0.1 mg Oral QHS  . docusate sodium  100 mg Oral BID  . feeding supplement (ENSURE ENLIVE)  237 mL Oral BID BM  . finasteride  5 mg Oral Daily  . FLUoxetine  20 mg Oral Daily  . insulin aspart  0-15 Units Subcutaneous TID WC  . insulin glargine  10 Units Subcutaneous QHS  . metoprolol succinate  25 mg Oral Daily  . pantoprazole  40 mg Oral Daily  . senna-docusate  1 tablet Oral BID  . tamsulosin  0.4 mg Oral Daily    Continuous Infusions: . vancomycin Stopped (03/12/17 1340)     LOS: 8 days     Alma Friendly, MD [facility]   If 7PM-7AM, please contact night-coverage [email or web address] Password [facility] 03/12/2017, 2:54 PM

## 2017-03-12 NOTE — Progress Notes (Signed)
[facility]  Date of Admission:  03/04/2017             ASSESSMENT/PLAN  Left hip hemiarthroplasty infection with MRSA -appears stable with improving leukocytosis and no further fevers.  Blood cultures complete and are negative.  PICC line in place with goal of 6 weeks of IV therapy with vancomycin.  1.  Continue vancomycin. 2.  OPAT orders at discharge if necessary pending disposition.   MRSA bacteremia - blood cultures finalized with no growth indicating resolution of bacteremia.  Continue treatment with vancomycin as above.   Diagnosis: MRSA bacteremia; left hip hemiarthroplasty infection with MRSA  Culture Result: MRSA  No Known Allergies  OPAT Orders Discharge antibiotics: Vancomycin Per pharmacy protocol  Aim for Vancomycin trough 15-20 (unless otherwise indicated) Duration: 6 weeks End Date: 04/22/17  [facility] Care Per Protocol:  Biweekly labs BMP  Labs weekly while on IV antibiotics: _X_ CBC with differential __ BMP __ CMP _X_ CRP __ ESR _X_ Vancomycin trough  X__ Please pull PIC at completion of IV antibiotics __ Please leave PIC in place until doctor has seen patient or been notified  Fax weekly labs to [phone]  Clinic Follow Up Appt: 4 Weeks with Ferguson, NP or Dr. Linus Salmons  ID will be available during admission as needed.    Principal Problem:   Wound infection after surgery Active Problems:   Leukocytosis   Chronic diastolic CHF (congestive heart failure) (HCC)   Acute renal failure superimposed on stage 3 chronic kidney disease (HCC)   Atrial fibrillation with normal ventricular rate (HCC)   CAD (coronary artery disease)   Anemia in chronic kidney disease (CKD)   Sepsis (HCC)   Acute delirium   . amLODipine  10 mg Oral Daily  . apixaban  5 mg Oral BID  . cloNIDine  0.1 mg Oral QHS  . docusate sodium  100 mg Oral BID  . feeding supplement (ENSURE ENLIVE)  237 mL Oral BID BM  . finasteride  5  mg Oral Daily  . FLUoxetine  20 mg Oral Daily  . insulin aspart  0-15 Units Subcutaneous TID WC  . insulin glargine  10 Units Subcutaneous QHS  . metoprolol succinate  25 mg Oral Daily  . pantoprazole  40 mg Oral Daily  . senna-docusate  1 tablet Oral BID  . tamsulosin  0.4 mg Oral Daily    SUBJECTIVE:  Interval History:  Afebrile overnight with slowly improving leukocytosis. Continues to have mild/moderate hip pain at the surgical site. Denies fevers, chills, or night sweats.   No Known Allergies   Review of Systems: Review of Systems  Constitutional: Negative for chills and fever.  Respiratory: Negative for cough, shortness of breath and wheezing.   Cardiovascular: Negative for chest pain.  Musculoskeletal:       Positive for left hip pain  Skin: Negative for rash.  Neurological: Negative for weakness.      OBJECTIVE: Vitals:   03/11/17 1000 03/11/17 1705 03/11/17 2125 03/12/17 0618  BP: (!) 171/89 (!) 139/59 128/64 119/67  Pulse: 86 70 78 94  Resp: _0 Temp: 98 F (36.7 C) 98.2 F (36.8 C) 98.9 F (37.2 C) 98.4 F (36.9 C)  TempSrc: Oral Oral Oral Oral  SpO2: 98% 98% 99% 100%  Weight:   163 lb 9.3 oz (74.2 kg)   Height:       Body mass index is 25.62 kg/m.  Physical Exam  Constitutional: No distress.  Sleeping upon entering - easily awakened to voice.   Cardiovascular: Normal rate, regular rhythm, normal heart sounds and intact distal pulses. Exam reveals no gallop and no friction rub.  No murmur heard. Pulmonary/Chest: Effort normal and breath sounds normal. No respiratory distress. He has no wheezes. He has no rales. He exhibits no tenderness.  Musculoskeletal:  Left BKA. VAC site appears clean, dry and intact with good suction present. There is no drainage noted.   Skin: Skin is warm and dry.    Lab Results Lab Results  Component Value Date   WBC 18.6 (H) 03/12/2017   HGB 7.9 (L) 03/12/2017   HCT 25.1 (L) 03/12/2017   MCV 83.1  03/12/2017   PLT 361 03/12/2017    Lab Results  Component Value Date   CREATININE 0.95 03/12/2017   BUN 15 03/12/2017   NA 134 (L) 03/12/2017   K 3.6 03/12/2017   CL 95 (L) 03/12/2017   CO2 28 03/12/2017    Lab Results  Component Value Date   ALT 10 (L) 03/06/2017   AST 13 (L) 03/06/2017   ALKPHOS 188 (H) 03/06/2017   BILITOT 1.2 03/06/2017     Microbiology: Recent Results (from the past 240 hour(s))  Urine culture     Status: Abnormal   Collection Time: 03/04/17  5:44 AM  Result Value Ref Range Status   Specimen Description   Final    URINE, CATHETERIZED Performed at [name], [address], [place], [place] [address]    Special Requests   Final    NONE Performed at [facility], [address] [place], [place] [address]    Culture (A)  Final    20,000 COLONIES/mL METHICILLIN RESISTANT STAPHYLOCOCCUS AUREUS   Report Status 03/06/2017 FINAL  Final   Organism ID, Bacteria METHICILLIN RESISTANT STAPHYLOCOCCUS AUREUS (A)  Final      Susceptibility   Methicillin resistant staphylococcus aureus - MIC*    CIPROFLOXACIN >=8 RESISTANT Resistant     GENTAMICIN >=16 RESISTANT Resistant     NITROFURANTOIN <=16 SENSITIVE Sensitive     OXACILLIN >=4 RESISTANT Resistant     TETRACYCLINE <=1 SENSITIVE Sensitive     VANCOMYCIN 1 SENSITIVE Sensitive     TRIMETH/SULFA <=10 SENSITIVE Sensitive     CLINDAMYCIN >=8 RESISTANT Resistant     RIFAMPIN <=0.5 SENSITIVE Sensitive     Inducible Clindamycin NEGATIVE Sensitive     * 20,000 COLONIES/mL METHICILLIN RESISTANT STAPHYLOCOCCUS AUREUS  Culture, blood (routine x 2)     Status: Abnormal   Collection Time: 03/04/17  6:03 AM  Result Value Ref Range Status   Specimen Description   Final    BLOOD RIGHT ARM Performed at [facility], [address], [place], [place] [address]    Special Requests   Final    BOTTLES DRAWN AEROBIC AND ANAEROBIC Blood Culture adequate volume Performed at [facility], [address], [place], [place]  [address]    Culture  Setup Time   Final    GRAM POSITIVE COCCI ANAEROBIC BOTTLE ONLY Gram Stain Report Called to,Read Back By and Verified With: MURPHY,N _0  ON 3.1.19 BY BOWMAN,L Performed at [facility], [address] [place],  [address]    Culture METHICILLIN RESISTANT STAPHYLOCOCCUS AUREUS (A)  Final   Report Status 03/07/2017 FINAL  Final   Organism ID, Bacteria METHICILLIN RESISTANT STAPHYLOCOCCUS AUREUS  Final      Susceptibility   Methicillin resistant staphylococcus aureus - MIC*    CIPROFLOXACIN >=8  RESISTANT Resistant     ERYTHROMYCIN >=8 RESISTANT Resistant     GENTAMICIN >=16 RESISTANT Resistant     OXACILLIN >=4 RESISTANT Resistant     TETRACYCLINE <=1 SENSITIVE Sensitive     VANCOMYCIN <=0.5 SENSITIVE Sensitive     TRIMETH/SULFA <=10 SENSITIVE Sensitive     CLINDAMYCIN >=8 RESISTANT Resistant     RIFAMPIN <=0.5 SENSITIVE Sensitive     Inducible Clindamycin NEGATIVE Sensitive     * METHICILLIN RESISTANT STAPHYLOCOCCUS AUREUS  Culture, blood (routine x 2)     Status: Abnormal   Collection Time: 03/04/17  6:05 AM  Result Value Ref Range Status   Specimen Description   Final    BLOOD RIGHT FOREARM DRAWN BY RN Performed at [facility], [address], [place], [place] [address]    Special Requests   Final    BOTTLES DRAWN AEROBIC AND ANAEROBIC Blood Culture adequate volume Performed at [facility], [address], [place] [address]    Culture  Setup Time   Final    GRAM POSITIVE COCCI IN BOTH AEROBIC AND ANAEROBIC BOTTLES Gram Stain Report Called to,Read Back By and Verified With: MURPHY,N @ 0981 ON 3.1.19 BY BOWMAN,L    Culture (A)  Final    STAPHYLOCOCCUS SPECIES (COAGULASE NEGATIVE) THE SIGNIFICANCE OF ISOLATING THIS ORGANISM FROM A SINGLE SET OF BLOOD CULTURES WHEN MULTIPLE SETS ARE DRAWN IS UNCERTAIN. PLEASE NOTIFY THE MICROBIOLOGY DEPARTMENT WITHIN ONE WEEK IF SPECIATION AND SENSITIVITIES ARE REQUIRED. Performed at [facility],  [place] [address], [place], [place] [address]    Report Status 03/07/2017 FINAL  Final  Blood Culture ID Panel (Reflexed)     Status: Abnormal   Collection Time: 03/04/17  6:05 AM  Result Value Ref Range Status   Enterococcus species NOT DETECTED NOT DETECTED Final   Listeria monocytogenes NOT DETECTED NOT DETECTED Final   Staphylococcus species DETECTED (A) NOT DETECTED Final    Comment: Methicillin (oxacillin) resistant coagulase negative staphylococcus. Possible blood culture contaminant (unless isolated from more than one blood culture draw or clinical case suggests pathogenicity). No antibiotic treatment is indicated for blood  culture contaminants. CRITICAL RESULT CALLED TO, READ BACK BY AND VERIFIED WITH: M. Radford Pax Pharm.D. 9:40 03/05/17 ([email or web address])    Staphylococcus aureus NOT DETECTED NOT DETECTED Final   Methicillin resistance DETECTED (A) NOT DETECTED Final    Comment: CRITICAL RESULT CALLED TO, READ BACK BY AND VERIFIED WITH: M.  Radford Pax Pharm.D. 9:40 03/05/17 (wilsonm)    Streptococcus species NOT DETECTED NOT DETECTED Final   Streptococcus agalactiae NOT DETECTED NOT DETECTED Final   Streptococcus pneumoniae NOT DETECTED NOT DETECTED Final   Streptococcus pyogenes NOT DETECTED NOT DETECTED Final   Acinetobacter baumannii NOT DETECTED NOT DETECTED Final   Enterobacteriaceae species NOT DETECTED NOT DETECTED Final   Enterobacter cloacae complex NOT DETECTED NOT DETECTED Final   Escherichia coli NOT DETECTED NOT DETECTED Final   Klebsiella oxytoca NOT DETECTED NOT DETECTED Final   Klebsiella pneumoniae NOT DETECTED NOT DETECTED Final   Proteus species NOT DETECTED NOT DETECTED Final   Serratia marcescens NOT DETECTED NOT DETECTED Final   Haemophilus influenzae NOT DETECTED NOT DETECTED Final   Neisseria meningitidis NOT DETECTED NOT DETECTED Final   Pseudomonas aeruginosa NOT DETECTED NOT DETECTED Final   Candida albicans NOT DETECTED NOT DETECTED Final   Candida glabrata NOT DETECTED  NOT DETECTED Final   Candida krusei NOT DETECTED NOT DETECTED Final   Candida parapsilosis NOT DETECTED NOT DETECTED Final  Candida tropicalis NOT DETECTED NOT DETECTED Final    Comment: Performed at [facility], [place] [address] [place], [place] [address]  Aerobic Culture (superficial specimen)     Status: None   Collection Time: 03/04/17  8:48 AM  Result Value Ref Range Status   Specimen Description   Final    HIP LEFT Performed at [facility], [address], [place], [place] [address]    Special Requests   Final    Immunocompromised Performed at [facility], [address], [place], [place] [address]    Gram Stain   Final    RARE WBC PRESENT, PREDOMINANTLY PMN FEW GRAM POSITIVE COCCI IN PAIRS    Culture   Final    ABUNDANT METHICILLIN RESISTANT STAPHYLOCOCCUS AUREUS ABUNDANT DIPHTHEROIDS(CORYNEBACTERIUM SPECIES) Standardized susceptibility testing for this organism is not available. Performed at [facility], [place] [address] [place], [place] [address]    Report Status 03/07/2017 FINAL  Final   Organism ID, Bacteria METHICILLIN RESISTANT STAPHYLOCOCCUS AUREUS  Final      Susceptibility   Methicillin resistant staphylococcus aureus - MIC*    CIPROFLOXACIN >=8 RESISTANT Resistant     ERYTHROMYCIN >=8 RESISTANT Resistant     GENTAMICIN >=16 RESISTANT Resistant     OXACILLIN >=4 RESISTANT Resistant     TETRACYCLINE <=1 SENSITIVE Sensitive     VANCOMYCIN <=0.5 SENSITIVE Sensitive     TRIMETH/SULFA <=10 SENSITIVE Sensitive     CLINDAMYCIN >=8 RESISTANT Resistant     RIFAMPIN <=0.5 SENSITIVE Sensitive     Inducible Clindamycin NEGATIVE Sensitive     * ABUNDANT METHICILLIN RESISTANT STAPHYLOCOCCUS AUREUS  Aerobic Culture (superficial specimen)     Status: None   Collection Time: 03/05/17 11:42 AM  Result Value Ref Range Status   Specimen Description WOUND LEFT HIP  Final   Special Requests PATIENT ON FOLLOWING ZOSYN GENTAMYCIN  Final   Gram Stain   Final     FEW WBC PRESENT,BOTH PMN AND MONONUCLEAR FEW GRAM POSITIVE COCCI IN CLUSTERS Performed at [facility], [place] [address], [place], [place] [address]    Culture   Final    MODERATE METHICILLIN RESISTANT STAPHYLOCOCCUS AUREUS   Report Status 03/07/2017 FINAL  Final   Organism ID, Bacteria METHICILLIN RESISTANT STAPHYLOCOCCUS AUREUS  Final      Susceptibility   Methicillin resistant staphylococcus aureus - MIC*    CIPROFLOXACIN >=8 RESISTANT Resistant     ERYTHROMYCIN >=8 RESISTANT Resistant     GENTAMICIN <=0.5 SENSITIVE Sensitive     OXACILLIN >=4 RESISTANT Resistant     TETRACYCLINE <=1 SENSITIVE Sensitive     VANCOMYCIN 1 SENSITIVE Sensitive     TRIMETH/SULFA <=10 SENSITIVE Sensitive     CLINDAMYCIN >=8 RESISTANT Resistant     RIFAMPIN <=0.5 SENSITIVE Sensitive     Inducible Clindamycin NEGATIVE Sensitive     * MODERATE METHICILLIN RESISTANT STAPHYLOCOCCUS AUREUS  Culture, blood (routine x 2)     Status: None   Collection Time: 03/06/17  6:32 AM  Result Value Ref Range Status   Specimen Description BLOOD LEFT ANTECUBITAL  Final   Special Requests IN PEDIATRIC BOTTLE Blood Culture adequate volume  Final   Culture   Final    NO GROWTH 5 DAYS Performed at [facility], [place] [address], [place],  [address]    Report Status 03/11/2017 FINAL  Final  Culture, blood (routine x 2)     Status: None   Collection Time: 03/06/17  6:38 AM  Result Value Ref Range Status   Specimen Description BLOOD LEFT ARM  Final   Special Requests   Final    IN PEDIATRIC BOTTLE Blood Culture results may not be optimal due to an excessive volume of blood received in culture bottles   Culture   Final    NO GROWTH 5 DAYS Performed at [facility] [address], [place], [place] [address]    Report Status 03/11/2017 FINAL  Final     Terri Piedra, NP [facility] [phone] Pager  03/12/2017  9:26 AM

## 2017-03-12 NOTE — Progress Notes (Signed)
Initial Nutrition Assessment  DOCUMENTATION CODES:   Not applicable  INTERVENTION:   Increase Ensure Enlive po TID, each supplement provides 350 kcal and 20 grams of protein  If po intake remains inadequate, recommend liberalizing diet to Regular  NUTRITION DIAGNOSIS:   Inadequate oral intake related to poor appetite as evidenced by meal completion < 25%, per patient/family report.  GOAL:   Patient will meet greater than or equal to 90% of their needs  MONITOR:   PO intake, Supplement acceptance, Labs, Weight trends  REASON FOR ASSESSMENT:   Malnutrition Screening Tool    ASSESSMENT:   84 yo male admitted with acute metabolic encephalopathy and sepsis from surgical wound infection. Pt had left hip hemiarthroplasty on 02/24/17. Pt with hx of CHF, CKD III, CAD   3/1: I and D of left hip and placement of antibiotic beads 3/6: Repeat I and D of left hip infection, antibiotic bead placement;  Wound vac placed  Pt had not touched lunch tray on visit today, stated he wasn't quite ready yet. Pt ate 0% at breakfast this AM, 45% at breakfast yesterday. Pt affect flat. Pt was drinking some Ensure Enlive.   No significant weight loss since admission in February. However appears pt has lost significant wt since Fall 2018 but noted pt with surgeries, amputations during that time frame.    Labs: calcium 10.8 (H-no current albumin) Meds: reviewed  NUTRITION - FOCUSED PHYSICAL EXAM:  Unable to assess   Diet Order:  Posterior Total Hip Precautions Fall precautions Diet Carb Modified Fluid consistency: Thin; Room service appropriate? Yes  EDUCATION NEEDS:   No education needs have been identified at this time  Skin:  Skin Assessment: Skin Integrity Issues: Skin Integrity Issues:: Wound VAC Wound Vac: hip  Last BM:  3/6  Height:   Ht Readings from Last 1 Encounters:  03/05/17 5\' 7"  (1.702 m)    Weight:   Wt Readings from Last 1 Encounters:  03/11/17 163 lb 9.3 oz  (74.2 kg)    Ideal Body Weight:     BMI:  Body mass index is 25.62 kg/m.  Estimated Nutritional Needs:   Kcal:  8280-0349 kcals  Protein:  90-105 g  Fluid:  >/= 1.7 L   Kerman Passey MS, RD, LDN, CNSC [phone] Pager  [phone] Weekend/On-Call Pager

## 2017-03-12 NOTE — Clinical Social Work Note (Signed)
Clinical Social Work Assessment  Patient Details  Name: [name] MRN: [id number] Date of Birth: 02-09-1931  Date of referral:  03/10/17               Reason for consult:  Facility Placement, Discharge Planning                Permission sought to share information with:  Family Supports, Other Permission granted to share information::  No(Patient oriented to person and place only)  Name::     [name]  Agency::     Relationship::  Power of Attorney (POA)  Contact Information:  [phone]  Housing/Transportation Living arrangements for the past 2 months:  Single Family Home Source of Information:  Power of Attorney Patient Interpreter Needed:  None Criminal Activity/Legal Involvement Pertinent to Current Situation/Hospitalization:  No - Comment as needed Significant Relationships:  Friend, Other(Comment)([name] is patient's friend and POA) Lives with:  Self(Patient also has 24/7 caregiver - private pay) Do you feel safe going back to the place where you live?  No([name] is in agreement with [facility] rehab) Need for family participation in patient care:     Care giving concerns:  [name] reported that patient has 24/7, provided by [name] care at his home (private pay) and POA expressed no concerns regarding this care.  Social Worker assessment / plan:  CSW talked with [name] regarding patient's discharge plan. Clarified that he is not Legal Guardian, but POA. When asked, [name] reported that patient has been to rehab before at [facility] and was there for over 20 days. [name] indicated that patient's daughter is not involved and this is why he is POA. He reported that patient's brother [name] does not have with Mr. Hattabaugh, however the brother is sick right now.    POA in agreement with ST rehab and cited his preferences as [facility] and [facility]. CSW explained facility search process and that he will be  contacted with facility responses.  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 did not express any concerns regarding patient's care during hospitalization.  Patient/Family's Understanding of and Emotional Response to Diagnosis, Current Treatment, and Prognosis: [name] appears to care about [name] and to have understanding of patient's medical needs and what he also needs at home to be safe.   Emotional Assessment Appearance:  Other (Comment Required(Did not visit room) Attitude/Demeanor/Rapport:  Unable to Assess(Did not visit room due to patient's orientation status) Affect (typically observed):  Unable to Assess Orientation:  Oriented to Self, Oriented to Place Alcohol / Substance use:  Tobacco Use, Alcohol Use, Illicit Drugs(Patient reported that he quit smoking and does not drink or use illicit drugs) Psych involvement (Current and /or in the community):  No (Comment)  Discharge Needs  Concerns to be addressed:  Discharge Planning Concerns Readmission within the last 30 days:  Yes Current discharge risk:  None Barriers to Discharge:  Continued Medical Work up   [employer] [name], [place] 03/12/2017, 4:27 PM

## 2017-03-13 LAB — CBC WITH DIFFERENTIAL/PLATELET
BASOS PCT: 0 %
Band Neutrophils: 0 %
Basophils Absolute: 0 10*3/uL (ref 0.0–0.1)
Blasts: 0 %
EOS ABS: 0.6 10*3/uL (ref 0.0–0.7)
Eosinophils Relative: 3 %
HEMATOCRIT: 24.4 % — AB (ref 39.0–52.0)
HEMOGLOBIN: 7.7 g/dL — AB (ref 13.0–17.0)
Lymphocytes Relative: 6 %
Lymphs Abs: 1.1 10*3/uL (ref 0.7–4.0)
MCH: 26.6 pg (ref 26.0–34.0)
MCHC: 31.6 g/dL (ref 30.0–36.0)
MCV: 84.1 fL (ref 78.0–100.0)
METAMYELOCYTES PCT: 0 %
MYELOCYTES: 0 %
Monocytes Absolute: 0.7 10*3/uL (ref 0.1–1.0)
Monocytes Relative: 4 %
NEUTROS PCT: 87 %
NRBC: 0 /100{WBCs}
Neutro Abs: 16.2 10*3/uL — ABNORMAL HIGH (ref 1.7–7.7)
PROMYELOCYTES ABS: 0 %
Platelets: 330 10*3/uL (ref 150–400)
RBC: 2.9 MIL/uL — AB (ref 4.22–5.81)
RDW: 16.8 % — ABNORMAL HIGH (ref 11.5–15.5)
WBC: 18.6 10*3/uL — AB (ref 4.0–10.5)

## 2017-03-13 LAB — GLUCOSE, CAPILLARY
GLUCOSE-CAPILLARY: 112 mg/dL — AB (ref 65–99)
GLUCOSE-CAPILLARY: 105 mg/dL — AB (ref 65–99)
GLUCOSE-CAPILLARY: 115 mg/dL — AB (ref 65–99)
GLUCOSE-CAPILLARY: 147 mg/dL — AB (ref 65–99)

## 2017-03-13 LAB — BASIC METABOLIC PANEL
Anion gap: 12 (ref 5–15)
BUN: 17 mg/dL (ref 6–20)
CHLORIDE: 93 mmol/L — AB (ref 101–111)
CO2: 29 mmol/L (ref 22–32)
CREATININE: 1.06 mg/dL (ref 0.61–1.24)
Calcium: 11 mg/dL — ABNORMAL HIGH (ref 8.9–10.3)
GFR calc non Af Amer: >60 mL/min (ref >60)
Glucose, Bld: 152 mg/dL — ABNORMAL HIGH (ref 65–99)
POTASSIUM: 3.6 mmol/L (ref 3.5–5.1)
SODIUM: 134 mmol/L — AB (ref 135–145)

## 2017-03-13 LAB — VANCOMYCIN, TROUGH: VANCOMYCIN TR: 33 ug/mL — AB (ref 15–20)

## 2017-03-13 MED ORDER — VANCOMYCIN HCL 500 MG IV SOLR
500.0000 mg | INTRAVENOUS | Status: DC
  Administered 2017-03-14 – 2017-03-16 (×3): 500 mg via INTRAVENOUS
  Filled 2017-03-13 (×4): qty 500

## 2017-03-13 NOTE — Progress Notes (Signed)
PHARMACY CONSULT NOTE FOR:  OUTPATIENT  PARENTERAL ANTIBIOTIC THERAPY (OPAT)  Indication: MRSA bacteremia Regimen: 500 mg vancomycin every 24 hours End date: April 22, 2017  IV antibiotic discharge orders are pended. To discharging provider:  please sign these orders via discharge navigator,  Select New Orders & click on the button choice - Manage This Unsigned Work.     Thank you for allowing pharmacy to be a part of this patient's care.  Petr Bontempo A Desirre Eickhoff 03/13/2017, 12:53 PM

## 2017-03-13 NOTE — Progress Notes (Signed)
Received call from central telemetry. Patient had pause greater than 3 seconds. Text message sent to oncall. Will continue to monitor. Bartholomew Crews, RN

## 2017-03-13 NOTE — Progress Notes (Signed)
Pharmacy Antibiotic Note  [name] is a 84 y.o. male admitted on 03/04/2017 with confusion/hip pain from surgical site from hip surgery done on 2/20. Pharmacy is consulted for Vancomycin dosing.  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 S/p I&D and placement of abx beads on 3/1. Not candidate for rifampin. Planning 6 weeks of IV abx followed by long term oral suppression. Afeb, WBC trending down but still elevated at 18.6.  3/9 vanc trough is supratherapeutic at 33.   Plan: Decrease vancomycin to 500 mg IV Q24h Monitor clinical picture, renal function, VT at new Css F/U C&S, abx deescalation / LOT  Height: 5\' 7"  (170.2 cm) Weight: 163 lb 9.3 oz (74.2 kg) IBW/kg (Calculated) : 66.1  Temp (24hrs), Avg:97.9 F (36.6 C), Min:97.8 F (36.6 C), Max:98 F (36.7 C)  Recent Labs  Lab 03/09/17 0747 03/10/17 0541 03/10/17 1050 03/11/17 0530 03/12/17 0357 03/13/17 0425 03/13/17 1121  WBC 21.6*  --  19.4* 23.0* 18.6* 18.6*  --   CREATININE 0.85 0.92 0.87 0.90 0.95 1.06  --   VANCOTROUGH  --  27*  --   --   --   --  33*    Estimated Creatinine Clearance: 49.4 mL/min (by C-G formula based on SCr of 1.06 mg/dL).    No Known Allergies  Antimicrobials this admission: Vancomycin 2/28 >> Zosyn 2/28 >> 3/1  Dose adjustments this admission: 3/4: Empiric change of vanc from 1g Q24 to 750mg  Q12  3/6 VT = 27 Decrease dose back down to 1g Q24 3/9 VT = 33 decrease to 500 mg q24h  Microbiology results: 2/28 BCx >> 3/4 MRSE 3/1 WCx (intra-op) >>MRSA 3/2 BCx >> ngtd  Angus Seller, PharmD Pharmacy Resident [phone] 03/13/2017 12:41 PM

## 2017-03-13 NOTE — Progress Notes (Addendum)
PROGRESS NOTE  [name] STM:[id number] DOB: 12/10/1931 DOA: 03/04/2017 PCP: [name], MD  HPI/Recap of past 24 hours: [name] a 83 y.o.malefrom home where he was found in his bed with a lot of bloody drainage from around his surgical site where he had a left hip surgery done on February 20 by Dr. Lorin Mercy. Admitted for acute metabolic encephalopathy and sepsis 2/2 to surgical wound infection. Patient admitted with left Hemiarthroplasty infection, underwent I and D left hip and placement of prostalac absorbable vancomycin and gentamycin beads on 3-01 by Dr Lorin Mercy. Culture from wound grew MRSA. He was also diagnosed with MRSA Bacteremia. ID has been following patient in consultation. Plan for 6 Weeks of IV antibiotics. Repeat blood culture negative to date. Patient noted to have significant drainage form left hip. Dr Lorin Mercy performed a repeat I&D on 03/10/17.  Early this am, pt noted to have some bradycardia HR in the 30s, non-sustained, also a 3 sec pause was noted, asymptomatic. Pt reported no new complaints, denies chest pain, SOB, abdominal pain, fever/chills. Looks comfortable. Advised NT/RN to assist pt in feeding as pt food tray always seemed untouched.  Assessment/Plan: Principal Problem:   Wound infection after surgery Active Problems:   Leukocytosis   Chronic diastolic CHF (congestive heart failure) (HCC)   Acute renal failure superimposed on stage 3 chronic kidney disease (HCC)   Atrial fibrillation with normal ventricular rate (HCC)   CAD (coronary artery disease)   Anemia in chronic kidney disease (CKD)   Sepsis (HCC)   Acute delirium   Hemiarthroplasty left hip MRSA infection Afebrile, with downtrending leukocytosis Recent left hip surgery 02/24/17 Underwent I and D left hip and placement of prostalac absorbable vancomycin and gentamycin beads on 3-01 and 3/6 ID following, continue with vancomycin. Needs 6 weeks of IV antibiotics (till 04/22/17) Cultures  from wound; Moderate staph aureus Ortho on board  MRSA Bacteremia/Sepsis Blood culture on 3/2 NGTD On IV antibiotics.  ECHO, no evidence of vegetation Needs 6 weeks of IV antibiotics, follow by 3-6 months of oral therapy (Bactrim or Doxy, and likely long term suppression after, per ID recommendations PICC line placed  Acute metabolic encephalopathy Mental status fluctuates. ??dementia Monitor closely  AKI Resolved s/p IVF  Mild hypercalcemia Monitor closely  Chronic A Fib Rate controlled Held metoprolol on 3/9 due to episode of bradycardia/pause Continue eliquis Monitor closely  HTN Stable Continue home norvasc, clonidine  Normocytic anemia monitor hgb, currently stable   DM type 2 lantus 10 units, SSI.   Depression/anxiety Prozac  BPH Finasteride, tamsulosin   Code Status: Partial code  Family Communication: None at bedside  Disposition Plan: SNF possibly on 03/15/17    Consultants:  Orthopedics  ID  Procedures:  S/P I&D   Antimicrobials:  IV Vancomycin  DVT prophylaxis:  Eliquis   Objective: Vitals:   03/13/17 0356 03/13/17 0357 03/13/17 0619 03/13/17 0820  BP: (!) 130/48 (!) 130/54 (!) 136/55 (!) 141/72  Pulse: (!) 52 (!) 58 (!) 56 69  Resp:   13 16  Temp:   98 F (36.7 C) 98 F (36.7 C)  TempSrc:   Oral Oral  SpO2:   100% 100%  Weight:      Height:        Intake/Output Summary (Last 24 hours) at 03/13/2017 1629 Last data filed at 03/13/2017 0900 Gross per 24 hour  Intake 0 ml  Output 300 ml  Net -300 ml   Filed Weights   03/07/17 2100 03/10/17  2031 03/11/17 2125  Weight: 77.7 kg (171 lb 4.8 oz) 74 kg (163 lb 2.3 oz) 74.2 kg (163 lb 9.3 oz)    Exam:   General:  NAD  Cardiovascular: S1, S2 present  Respiratory: CTA  Abdomen: Soft, NT, ND, BS+  Musculoskeletal: L BKA, tenderness over L hip, VAC in place  Skin: Normal  Psychiatry: Normal mood   Data Reviewed: CBC: Recent Labs  Lab 03/07/17 0522   03/09/17 0747 03/10/17 1050 03/11/17 0530 03/12/17 0357 03/13/17 0425  WBC 20.4*   < > 21.6* 19.4* 23.0* 18.6* 18.6*  NEUTROABS 18.2*  --   --  17.3* 20.3* 15.9* 16.2*  HGB 8.7*   < > 8.9* 8.7* 8.8* 7.9* 7.7*  HCT 27.5*   < > 28.6* 28.4* 28.9* 25.1* 24.4*  MCV 82.6   < > 81.7 81.6 82.8 83.1 84.1  PLT 452*   < > 457* 442* 467* 361 330   < > = values in this interval not displayed.   Basic Metabolic Panel: Recent Labs  Lab 03/10/17 0541 03/10/17 1050 03/11/17 0530 03/12/17 0357 03/13/17 0425  NA 134* 134* 135 134* 134*  K 3.4* 3.5 3.7 3.6 3.6  CL 97* 98* 96* 95* 93*  CO2 29 29 30 28 29   GLUCOSE 199* 196* 149* 155* 152*  BUN 15 13 12 15 17   CREATININE 0.92 0.87 0.90 0.95 1.06  CALCIUM 10.7* 10.8* 10.9* 10.8* 11.0*   GFR: Estimated Creatinine Clearance: 49.4 mL/min (by C-G formula based on SCr of 1.06 mg/dL). Liver Function Tests: No results for input(s): AST, ALT, ALKPHOS, BILITOT, PROT, ALBUMIN in the last 168 hours. No results for input(s): LIPASE, AMYLASE in the last 168 hours. No results for input(s): AMMONIA in the last 168 hours. Coagulation Profile: No results for input(s): INR, PROTIME in the last 168 hours. Cardiac Enzymes: No results for input(s): CKTOTAL, CKMB, CKMBINDEX, TROPONINI in the last 168 hours. BNP (last 3 results) No results for input(s): PROBNP in the last 8760 hours. HbA1C: No results for input(s): HGBA1C in the last 72 hours. CBG: Recent Labs  Lab 03/12/17 1156 03/12/17 1723 03/12/17 2151 03/13/17 0747 03/13/17 1138  GLUCAP 106* 200* 158* 112* 105*   Lipid Profile: No results for input(s): CHOL, HDL, LDLCALC, TRIG, CHOLHDL, LDLDIRECT in the last 72 hours. Thyroid Function Tests: No results for input(s): TSH, T4TOTAL, FREET4, T3FREE, THYROIDAB in the last 72 hours. Anemia Panel: No results for input(s): VITAMINB12, FOLATE, FERRITIN, TIBC, IRON, RETICCTPCT in the last 72 hours. Urine analysis:    Component Value Date/Time    COLORURINE YELLOW 03/04/2017 0544   APPEARANCEUR HAZY (A) 03/04/2017 0544   APPEARANCEUR Cloudy (A) 11/12/2016 1101   LABSPEC 1.012 03/04/2017 0544   PHURINE 5.0 03/04/2017 0544   GLUCOSEU 50 (A) 03/04/2017 0544   HGBUR SMALL (A) 03/04/2017 0544   BILIRUBINUR NEGATIVE 03/04/2017 0544   BILIRUBINUR Negative 11/12/2016 1101   KETONESUR 5 (A) 03/04/2017 0544   PROTEINUR 100 (A) 03/04/2017 0544   UROBILINOGEN 0.2 12/19/2013 1905   NITRITE NEGATIVE 03/04/2017 0544   LEUKOCYTESUR NEGATIVE 03/04/2017 0544   LEUKOCYTESUR 2+ (A) 11/12/2016 1101   Sepsis Labs: @LABRCNTIP (procalcitonin:4,lacticidven:4)  ) Recent Results (from the past 240 hour(s))  Urine culture     Status: Abnormal   Collection Time: 03/04/17  5:44 AM  Result Value Ref Range Status   Specimen Description   Final    URINE, CATHETERIZED Performed at [facility], [address] [place], [place] [address]    Special  Requests   Final    NONE Performed at [facility], [address], [place], [place] [address]    Culture (A)  Final    20,000 COLONIES/mL METHICILLIN RESISTANT STAPHYLOCOCCUS AUREUS   Report Status 03/06/2017 FINAL  Final   Organism ID, Bacteria METHICILLIN RESISTANT STAPHYLOCOCCUS AUREUS (A)  Final      Susceptibility   Methicillin resistant staphylococcus aureus - MIC*    CIPROFLOXACIN >=8 RESISTANT Resistant     GENTAMICIN >=16 RESISTANT Resistant     NITROFURANTOIN <=16 SENSITIVE Sensitive     OXACILLIN >=4 RESISTANT Resistant     TETRACYCLINE <=1 SENSITIVE Sensitive     VANCOMYCIN 1 SENSITIVE Sensitive     TRIMETH/SULFA <=10 SENSITIVE Sensitive     CLINDAMYCIN >=8 RESISTANT Resistant     RIFAMPIN <=0.5 SENSITIVE Sensitive     Inducible Clindamycin NEGATIVE Sensitive     * 20,000 COLONIES/mL METHICILLIN RESISTANT STAPHYLOCOCCUS AUREUS  Culture, blood (routine x 2)     Status: Abnormal   Collection Time: 03/04/17  6:03 AM  Result Value Ref Range Status   Specimen Description   Final    BLOOD  RIGHT ARM Performed at [facility], [address], [place], [place] [address]    Special Requests   Final    BOTTLES DRAWN AEROBIC AND ANAEROBIC Blood Culture adequate volume Performed at [facility], [address] [place], [place] [address]    Culture  Setup Time   Final    GRAM POSITIVE COCCI ANAEROBIC BOTTLE ONLY Gram Stain Report Called to,Read Back By and Verified With: MURPHY,N @0545  ON 3.1.19 BY BOWMAN,L Performed at [facility], [address] [place], [place] [address]    Culture METHICILLIN RESISTANT STAPHYLOCOCCUS AUREUS (A)  Final   Report Status 03/07/2017 FINAL  Final   Organism ID, Bacteria METHICILLIN RESISTANT STAPHYLOCOCCUS AUREUS  Final      Susceptibility   Methicillin resistant staphylococcus aureus - MIC*    CIPROFLOXACIN >=8 RESISTANT Resistant     ERYTHROMYCIN >=8 RESISTANT Resistant     GENTAMICIN >=16 RESISTANT Resistant     OXACILLIN >=4 RESISTANT Resistant     TETRACYCLINE <=1 SENSITIVE Sensitive     VANCOMYCIN <=0.5 SENSITIVE Sensitive     TRIMETH/SULFA <=10 SENSITIVE Sensitive     CLINDAMYCIN >=8 RESISTANT Resistant     RIFAMPIN <=0.5 SENSITIVE Sensitive     Inducible Clindamycin NEGATIVE Sensitive     * METHICILLIN RESISTANT STAPHYLOCOCCUS AUREUS  Culture, blood (routine x 2)     Status: Abnormal   Collection Time: 03/04/17  6:05 AM  Result Value Ref Range Status   Specimen Description   Final    BLOOD RIGHT FOREARM DRAWN BY RN Performed at [facility], [address] [place], [place] [address]    Special Requests   Final    BOTTLES DRAWN AEROBIC AND ANAEROBIC Blood Culture adequate volume Performed at [facility], [address], [place],  [address]    Culture  Setup Time   Final    GRAM POSITIVE COCCI IN BOTH AEROBIC AND ANAEROBIC BOTTLES Gram Stain Report Called to,Read Back By and Verified With: MURPHY,N @ 4259 ON 3.1.19 BY BOWMAN,L    Culture (A)  Final    STAPHYLOCOCCUS SPECIES (COAGULASE NEGATIVE) THE SIGNIFICANCE OF  ISOLATING THIS ORGANISM FROM A SINGLE SET OF BLOOD CULTURES WHEN MULTIPLE SETS ARE DRAWN IS UNCERTAIN. PLEASE NOTIFY THE MICROBIOLOGY DEPARTMENT WITHIN ONE WEEK IF SPECIATION AND SENSITIVITIES ARE REQUIRED. Performed at [facility], [place]  [address], [place], [place] [address]    Report Status 03/07/2017 FINAL  Final  Blood Culture ID Panel (Reflexed)     Status: Abnormal   Collection Time: 03/04/17  6:05 AM  Result Value Ref Range Status   Enterococcus species NOT DETECTED NOT DETECTED Final   Listeria monocytogenes NOT DETECTED NOT DETECTED Final   Staphylococcus species DETECTED (A) NOT DETECTED Final    Comment: Methicillin (oxacillin) resistant coagulase negative staphylococcus. Possible blood culture contaminant (unless isolated from more than one blood culture draw or clinical case suggests pathogenicity). No antibiotic treatment is indicated for blood  culture contaminants. CRITICAL RESULT CALLED TO, READ BACK BY AND VERIFIED WITH: M. Radford Pax Pharm.D. 9:40 03/05/17 ([email or web address])    Staphylococcus aureus NOT DETECTED NOT DETECTED Final   Methicillin resistance DETECTED (A) NOT DETECTED Final    Comment: CRITICAL RESULT CALLED TO, READ BACK BY AND VERIFIED WITH: M.  Radford Pax Pharm.D. 9:40 03/05/17 ([email or web address])    Streptococcus species NOT DETECTED NOT DETECTED Final   Streptococcus agalactiae NOT DETECTED NOT DETECTED Final   Streptococcus pneumoniae NOT DETECTED NOT DETECTED Final   Streptococcus pyogenes NOT DETECTED NOT DETECTED Final   Acinetobacter baumannii NOT DETECTED NOT DETECTED Final   Enterobacteriaceae species NOT DETECTED NOT DETECTED Final   Enterobacter cloacae complex NOT DETECTED NOT DETECTED Final   Escherichia coli NOT DETECTED NOT DETECTED Final   Klebsiella oxytoca NOT DETECTED NOT DETECTED Final   Klebsiella pneumoniae NOT DETECTED NOT DETECTED Final   Proteus species NOT DETECTED NOT DETECTED Final   Serratia marcescens NOT DETECTED NOT DETECTED Final    Haemophilus influenzae NOT DETECTED NOT DETECTED Final   Neisseria meningitidis NOT DETECTED NOT DETECTED Final   Pseudomonas aeruginosa NOT DETECTED NOT DETECTED Final   Candida albicans NOT DETECTED NOT DETECTED Final   Candida glabrata NOT DETECTED NOT DETECTED Final   Candida krusei NOT DETECTED NOT DETECTED Final   Candida parapsilosis NOT DETECTED NOT DETECTED Final   Candida tropicalis NOT DETECTED NOT DETECTED Final    Comment: Performed at [facility], [place] [address], [place], [place] [address]  Aerobic Culture (superficial specimen)     Status: None   Collection Time: 03/04/17  8:48 AM  Result Value Ref Range Status   Specimen Description   Final    HIP LEFT Performed at [facility], [address], [place], [place] [address]    Special Requests   Final    Immunocompromised Performed at [facility], [address] [place], [place] [address]    Gram Stain   Final    RARE WBC PRESENT, PREDOMINANTLY PMN FEW GRAM POSITIVE COCCI IN PAIRS    Culture   Final    ABUNDANT METHICILLIN RESISTANT STAPHYLOCOCCUS AUREUS ABUNDANT DIPHTHEROIDS(CORYNEBACTERIUM SPECIES) Standardized susceptibility testing for this organism is not available. Performed at [facility], [place] [address] [place], [place] [address]    Report Status 03/07/2017 FINAL  Final   Organism ID, Bacteria METHICILLIN RESISTANT STAPHYLOCOCCUS AUREUS  Final      Susceptibility   Methicillin resistant staphylococcus aureus - MIC*    CIPROFLOXACIN >=8 RESISTANT Resistant     ERYTHROMYCIN >=8 RESISTANT Resistant     GENTAMICIN >=16 RESISTANT Resistant     OXACILLIN >=4 RESISTANT Resistant     TETRACYCLINE <=1 SENSITIVE Sensitive     VANCOMYCIN <=0.5 SENSITIVE Sensitive     TRIMETH/SULFA <=10 SENSITIVE Sensitive     CLINDAMYCIN >=8 RESISTANT Resistant     RIFAMPIN <=0.5 SENSITIVE Sensitive  Inducible Clindamycin NEGATIVE Sensitive     * ABUNDANT METHICILLIN RESISTANT STAPHYLOCOCCUS AUREUS  Aerobic  Culture (superficial specimen)     Status: None   Collection Time: 03/05/17 11:42 AM  Result Value Ref Range Status   Specimen Description WOUND LEFT HIP  Final   Special Requests PATIENT ON FOLLOWING ZOSYN GENTAMYCIN  Final   Gram Stain   Final    FEW WBC PRESENT,BOTH PMN AND MONONUCLEAR FEW GRAM POSITIVE COCCI IN CLUSTERS Performed at [facility], [place] [address] [place], [place] [address]    Culture   Final    MODERATE METHICILLIN RESISTANT STAPHYLOCOCCUS AUREUS   Report Status 03/07/2017 FINAL  Final   Organism ID, Bacteria METHICILLIN RESISTANT STAPHYLOCOCCUS AUREUS  Final      Susceptibility   Methicillin resistant staphylococcus aureus - MIC*    CIPROFLOXACIN >=8 RESISTANT Resistant     ERYTHROMYCIN >=8 RESISTANT Resistant     GENTAMICIN <=0.5 SENSITIVE Sensitive     OXACILLIN >=4 RESISTANT Resistant     TETRACYCLINE <=1 SENSITIVE Sensitive     VANCOMYCIN 1 SENSITIVE Sensitive     TRIMETH/SULFA <=10 SENSITIVE Sensitive     CLINDAMYCIN >=8 RESISTANT Resistant     RIFAMPIN <=0.5 SENSITIVE Sensitive     Inducible Clindamycin NEGATIVE Sensitive     * MODERATE METHICILLIN RESISTANT STAPHYLOCOCCUS AUREUS  Culture, blood (routine x 2)     Status: None   Collection Time: 03/06/17  6:32 AM  Result Value Ref Range Status   Specimen Description BLOOD LEFT ANTECUBITAL  Final   Special Requests IN PEDIATRIC BOTTLE Blood Culture adequate volume  Final   Culture   Final    NO GROWTH 5 DAYS Performed at [facility], [place] [address], [place], [place] [address]    Report Status 03/11/2017 FINAL  Final  Culture, blood (routine x 2)     Status: None   Collection Time: 03/06/17  6:38 AM  Result Value Ref Range Status   Specimen Description BLOOD LEFT ARM  Final   Special Requests   Final    IN PEDIATRIC BOTTLE Blood Culture results may not be optimal due to an excessive volume of blood received in culture bottles   Culture   Final    NO GROWTH 5 DAYS Performed at [facility], [place] [address], [place], [place] [address]    Report Status 03/11/2017 FINAL  Final      Studies: No results found.  Scheduled Meds: . amLODipine  10 mg Oral Daily  . apixaban  5 mg Oral BID  . cloNIDine  0.1 mg Oral QHS  . docusate sodium  100 mg Oral BID  . feeding supplement (ENSURE ENLIVE)  237 mL Oral TID BM  . finasteride  5 mg Oral Daily  . FLUoxetine  20 mg Oral Daily  . insulin aspart  0-15 Units Subcutaneous TID WC  . insulin glargine  10 Units Subcutaneous QHS  . metoprolol succinate  25 mg Oral Daily  . pantoprazole  40 mg Oral Daily  . senna-docusate  1 tablet Oral BID  . tamsulosin  0.4 mg Oral Daily    Continuous Infusions: . [START ON 03/14/2017] vancomycin       LOS: 9 days     Alma Friendly, MD [facility]   If 7PM-7AM, please contact night-coverage [email or web address] Password [facility] 03/13/2017, 4:29 PM

## 2017-03-13 NOTE — Progress Notes (Signed)
CRITICAL VALUE ALERT  Critical Value:  Vanc trough 33  Date & Time Notied:  12 pm 3/9  Provider Notified: Horris Latino   Orders Received/Actions taken: Vanc discontinued

## 2017-03-13 NOTE — Progress Notes (Signed)
Received call from central telemetry of patient heart rate down to 36 nonsustained. Noted in history heart rate in 30s nonsustained. Patient sleeping. Vital signs assessed. Patient easily arouseable. Will continue to monitor. Bartholomew Crews, RN

## 2017-03-14 LAB — GLUCOSE, CAPILLARY
GLUCOSE-CAPILLARY: 114 mg/dL — AB (ref 65–99)
GLUCOSE-CAPILLARY: 112 mg/dL — AB (ref 65–99)
Glucose-Capillary: 170 mg/dL — ABNORMAL HIGH (ref 65–99)
Glucose-Capillary: 160 mg/dL — ABNORMAL HIGH (ref 65–99)

## 2017-03-14 LAB — CBC WITH DIFFERENTIAL/PLATELET
BASOS PCT: 0 %
Basophils Absolute: 0 10*3/uL (ref 0.0–0.1)
EOS ABS: 0.2 10*3/uL (ref 0.0–0.7)
EOS PCT: 1 %
HCT: 25 % — ABNORMAL LOW (ref 39.0–52.0)
Hemoglobin: 7.7 g/dL — ABNORMAL LOW (ref 13.0–17.0)
LYMPHS ABS: 0.9 10*3/uL (ref 0.7–4.0)
Lymphocytes Relative: 5 %
MCH: 25.8 pg — AB (ref 26.0–34.0)
MCHC: 30.8 g/dL (ref 30.0–36.0)
MCV: 83.9 fL (ref 78.0–100.0)
MONO ABS: 1.4 10*3/uL — AB (ref 0.1–1.0)
Monocytes Relative: 8 %
NEUTROS ABS: 14.7 10*3/uL — AB (ref 1.7–7.7)
NEUTROS PCT: 86 %
PLATELETS: 364 10*3/uL (ref 150–400)
RBC: 2.98 MIL/uL — ABNORMAL LOW (ref 4.22–5.81)
RDW: 16.8 % — ABNORMAL HIGH (ref 11.5–15.5)
WBC: 17.2 10*3/uL — ABNORMAL HIGH (ref 4.0–10.5)

## 2017-03-14 NOTE — Progress Notes (Signed)
PROGRESS NOTE  [name]:[id number] DOB: Feb 04, 1931 DOA: 03/04/2017 PCP: [name], MD  HPI/Recap of past 24 hours: [name] a 84 y.o.malefrom home where he was found in his bed with a lot of bloody drainage from around his surgical site where he had a left hip surgery done on February 20 by Dr. Lorin Mercy. Admitted for acute metabolic encephalopathy and sepsis 2/2 to surgical wound infection. Patient admitted with left Hemiarthroplasty infection, underwent I and D left hip and placement of prostalac absorbable vancomycin and gentamycin beads on 3-01 by Dr Lorin Mercy. Culture from wound grew MRSA. He was also diagnosed with MRSA Bacteremia. ID has been following patient in consultation. Plan for 6 Weeks of IV antibiotics. Repeat blood culture negative to date. Patient noted to have significant drainage form left hip. Dr Lorin Mercy performed a repeat I&D on 03/10/17.  Today, met pt sleeping, but easily arousable. Asking for pain meds, but looked comfortable, nurse notified. Denies chest pain, SOB, abdominal pain, fever/chills. Was able to drink his ensure without assistance  Assessment/Plan: Principal Problem:   Wound infection after surgery Active Problems:   Leukocytosis   Chronic diastolic CHF (congestive heart failure) (HCC)   Acute renal failure superimposed on stage 3 chronic kidney disease (HCC)   Atrial fibrillation with normal ventricular rate (HCC)   CAD (coronary artery disease)   Anemia in chronic kidney disease (CKD)   Sepsis (HCC)   Acute delirium   Hemiarthroplasty left hip MRSA infection Afebrile, with slowly downtrending leukocytosis Recent left hip surgery 02/24/17 Underwent I and D left hip and placement of prostalac absorbable vancomycin and gentamycin beads on 3-01 and 3/6 ID following, continue with vancomycin. Needs 6 weeks of IV antibiotics (till 04/22/17) Cultures from wound; Moderate staph aureus Ortho on board  MRSA Bacteremia/Sepsis Blood  culture on 3/2 NGTD On IV antibiotics.  ECHO, no evidence of vegetation Needs 6 weeks of IV antibiotics, follow by 3-6 months of oral therapy (Bactrim or Doxy, and likely long term suppression after, per ID recommendations PICC line placed  Acute metabolic encephalopathy Mental status fluctuates. ??dementia Monitor closely  AKI Resolved s/p IVF  Mild hypercalcemia Monitor closely  Chronic A Fib Rate controlled Held metoprolol on 3/9 due to episode of bradycardia/pause Continue eliquis Monitor closely  HTN Stable Continue home norvasc, clonidine  Normocytic anemia monitor hgb, currently stable   DM type 2 lantus 10 units, SSI.   Depression/anxiety Prozac  BPH Finasteride, tamsulosin   Code Status: Partial code  Family Communication: None at bedside  Disposition Plan: SNF possibly on 03/15/17, if WBC continues to trend down   Consultants:  Orthopedics  ID  Procedures:  S/P I&D   Antimicrobials:  IV Vancomycin  DVT prophylaxis:  Eliquis   Objective: Vitals:   03/13/17 1726 03/13/17 2237 03/14/17 0553 03/14/17 0900  BP: (!) 145/66 133/82 (!) 152/79 (!) 149/68  Pulse: 87 74 79 70  Resp: 18 17 18 18   Temp: 98.4 F (36.9 C) 97.8 F (36.6 C) 98.1 F (36.7 C) 98.6 F (37 C)  TempSrc: Oral Oral Oral Oral  SpO2: 99% 99% 98% 94%  Weight:  73.5 kg (162 lb)    Height:        Intake/Output Summary (Last 24 hours) at 03/14/2017 1131 Last data filed at 03/14/2017 0600 Gross per 24 hour  Intake 100 ml  Output 1170 ml  Net -1070 ml   Filed Weights   03/10/17 2031 03/11/17 2125 03/13/17 2237  Weight: 74 kg (163  lb 2.3 oz) 74.2 kg (163 lb 9.3 oz) 73.5 kg (162 lb)    Exam:   General:  NAD  Cardiovascular: S1, S2 present  Respiratory: CTA  Abdomen: Soft, NT, ND, BS+  Musculoskeletal: L BKA, L hip VAC in place  Skin: Normal  Psychiatry: Normal mood   Data Reviewed: CBC: Recent Labs  Lab 03/10/17 1050 03/11/17 0530  03/12/17 0357 03/13/17 0425 03/14/17 0457  WBC 19.4* 23.0* 18.6* 18.6* 17.2*  NEUTROABS 17.3* 20.3* 15.9* 16.2* 14.7*  HGB 8.7* 8.8* 7.9* 7.7* 7.7*  HCT 28.4* 28.9* 25.1* 24.4* 25.0*  MCV 81.6 82.8 83.1 84.1 83.9  PLT 442* 467* 361 330 191   Basic Metabolic Panel: Recent Labs  Lab 03/10/17 0541 03/10/17 1050 03/11/17 0530 03/12/17 0357 03/13/17 0425  NA 134* 134* 135 134* 134*  K 3.4* 3.5 3.7 3.6 3.6  CL 97* 98* 96* 95* 93*  CO2 29 29 30 28 29   GLUCOSE 199* 196* 149* 155* 152*  BUN 15 13 12 15 17   CREATININE 0.92 0.87 0.90 0.95 1.06  CALCIUM 10.7* 10.8* 10.9* 10.8* 11.0*   GFR: Estimated Creatinine Clearance: 49.4 mL/min (by C-G formula based on SCr of 1.06 mg/dL). Liver Function Tests: No results for input(s): AST, ALT, ALKPHOS, BILITOT, PROT, ALBUMIN in the last 168 hours. No results for input(s): LIPASE, AMYLASE in the last 168 hours. No results for input(s): AMMONIA in the last 168 hours. Coagulation Profile: No results for input(s): INR, PROTIME in the last 168 hours. Cardiac Enzymes: No results for input(s): CKTOTAL, CKMB, CKMBINDEX, TROPONINI in the last 168 hours. BNP (last 3 results) No results for input(s): PROBNP in the last 8760 hours. HbA1C: No results for input(s): HGBA1C in the last 72 hours. CBG: Recent Labs  Lab 03/13/17 0747 03/13/17 1138 03/13/17 1727 03/13/17 2227 03/14/17 0805  GLUCAP 112* 105* 115* 147* 114*   Lipid Profile: No results for input(s): CHOL, HDL, LDLCALC, TRIG, CHOLHDL, LDLDIRECT in the last 72 hours. Thyroid Function Tests: No results for input(s): TSH, T4TOTAL, FREET4, T3FREE, THYROIDAB in the last 72 hours. Anemia Panel: No results for input(s): VITAMINB12, FOLATE, FERRITIN, TIBC, IRON, RETICCTPCT in the last 72 hours. Urine analysis:    Component Value Date/Time   COLORURINE YELLOW 03/04/2017 0544   APPEARANCEUR HAZY (A) 03/04/2017 0544   APPEARANCEUR Cloudy (A) 11/12/2016 1101   LABSPEC 1.012 03/04/2017 0544    PHURINE 5.0 03/04/2017 0544   GLUCOSEU 50 (A) 03/04/2017 0544   HGBUR SMALL (A) 03/04/2017 0544   BILIRUBINUR NEGATIVE 03/04/2017 0544   BILIRUBINUR Negative 11/12/2016 1101   KETONESUR 5 (A) 03/04/2017 0544   PROTEINUR 100 (A) 03/04/2017 0544   UROBILINOGEN 0.2 12/19/2013 1905   NITRITE NEGATIVE 03/04/2017 0544   LEUKOCYTESUR NEGATIVE 03/04/2017 0544   LEUKOCYTESUR 2+ (A) 11/12/2016 1101   Sepsis Labs: @LABRCNTIP (procalcitonin:4,lacticidven:4)  ) Recent Results (from the past 240 hour(s))  Aerobic Culture (superficial specimen)     Status: None   Collection Time: 03/05/17 11:42 AM  Result Value Ref Range Status   Specimen Description WOUND LEFT HIP  Final   Special Requests PATIENT ON FOLLOWING ZOSYN GENTAMYCIN  Final   Gram Stain   Final    FEW WBC PRESENT,BOTH PMN AND MONONUCLEAR FEW GRAM POSITIVE COCCI IN CLUSTERS Performed at [facility], [place] [address], [place], [place] [address]    Culture   Final    MODERATE METHICILLIN RESISTANT STAPHYLOCOCCUS AUREUS   Report Status 03/07/2017 FINAL  Final   Organism ID, Bacteria  METHICILLIN RESISTANT STAPHYLOCOCCUS AUREUS  Final      Susceptibility   Methicillin resistant staphylococcus aureus - MIC*    CIPROFLOXACIN >=8 RESISTANT Resistant     ERYTHROMYCIN >=8 RESISTANT Resistant     GENTAMICIN <=0.5 SENSITIVE Sensitive     OXACILLIN >=4 RESISTANT Resistant     TETRACYCLINE <=1 SENSITIVE Sensitive     VANCOMYCIN 1 SENSITIVE Sensitive     TRIMETH/SULFA <=10 SENSITIVE Sensitive     CLINDAMYCIN >=8 RESISTANT Resistant     RIFAMPIN <=0.5 SENSITIVE Sensitive     Inducible Clindamycin NEGATIVE Sensitive     * MODERATE METHICILLIN RESISTANT STAPHYLOCOCCUS AUREUS  Culture, blood (routine x 2)     Status: None   Collection Time: 03/06/17  6:32 AM  Result Value Ref Range Status   Specimen Description BLOOD LEFT ANTECUBITAL  Final   Special Requests IN PEDIATRIC BOTTLE Blood Culture adequate volume  Final   Culture   Final     NO GROWTH 5 DAYS Performed at [facility], [place] [address], [place], [place] [address]    Report Status 03/11/2017 FINAL  Final  Culture, blood (routine x 2)     Status: None   Collection Time: 03/06/17  6:38 AM  Result Value Ref Range Status   Specimen Description BLOOD LEFT ARM  Final   Special Requests   Final    IN PEDIATRIC BOTTLE Blood Culture results may not be optimal due to an excessive volume of blood received in culture bottles   Culture   Final    NO GROWTH 5 DAYS Performed at [facility], [place] [address] [place], [place] [address]    Report Status 03/11/2017 FINAL  Final      Studies: No results found.  Scheduled Meds: . amLODipine  10 mg Oral Daily  . apixaban  5 mg Oral BID  . cloNIDine  0.1 mg Oral QHS  . docusate sodium  100 mg Oral BID  . feeding supplement (ENSURE ENLIVE)  237 mL Oral TID BM  . finasteride  5 mg Oral Daily  . FLUoxetine  20 mg Oral Daily  . insulin aspart  0-15 Units Subcutaneous TID WC  . insulin glargine  10 Units Subcutaneous QHS  . pantoprazole  40 mg Oral Daily  . senna-docusate  1 tablet Oral BID  . tamsulosin  0.4 mg Oral Daily    Continuous Infusions: . vancomycin       LOS: 10 days     Alma Friendly, MD [facility]   If 7PM-7AM, please contact night-coverage [email or web address] Password [facility] 03/14/2017, 11:31 AM

## 2017-03-14 NOTE — Progress Notes (Signed)
Pt refused CPAP for the night. RT will continue to monitor as needed.  

## 2017-03-14 NOTE — Progress Notes (Addendum)
Subjective: 4 Days Post-Op Procedure(s) (LRB): REPEAT IRRIGATION AND DEBRIDEMENT LEFT DEEP HIP INFECTION, ANTIBIOTIC BEAD PLACEMENT, VAC PLACEMENT (Left) Patient reports pain as 3 on 0-10 scale.  Appears comfortable but asking for pain meds.   Objective: Vital signs in last 24 hours: Temp:  [97.8 F (36.6 C)-98.6 F (37 C)] 98.6 F (37 C) (03/10 0900) Pulse Rate:  [70-87] 70 (03/10 0900) Resp:  [17-18] 18 (03/10 0900) BP: (133-152)/(66-82) 149/68 (03/10 0900) SpO2:  [94 %-99 %] 94 % (03/10 0900) Weight:  [162 lb (73.5 kg)] 162 lb (73.5 kg) (03/09 2237)  Intake/Output from previous day: 03/09 0701 - 03/10 0700 In: 100 [P.O.:100] Out: 1170 [Urine:1170] Intake/Output this shift: No intake/output data recorded.  Recent Labs    03/12/17 0357 03/13/17 0425 03/14/17 0457  HGB 7.9* 7.7* 7.7*   Recent Labs    03/13/17 0425 03/14/17 0457  WBC 18.6* 17.2*  RBC 2.90* 2.98*  HCT 24.4* 25.0*  PLT 330 364   Recent Labs    03/12/17 0357 03/13/17 0425  NA 134* 134*  K 3.6 3.6  CL 95* 93*  CO2 28 29  BUN 15 17  CREATININE 0.95 1.06  GLUCOSE 155* 152*  CALCIUM 10.8* 11.0*   No results for input(s): LABPT, INR in the last 72 hours.  Left thigh: Wound Vac in place no drainage Compartment soft  Assessment/Plan: 4 Days Post-Op Procedure(s) (LRB): REPEAT IRRIGATION AND DEBRIDEMENT LEFT DEEP HIP INFECTION, ANTIBIOTIC BEAD PLACEMENT, VAC PLACEMENT (Left) Continue current care Dr. Lorin Mercy to return tomorrow No drainage so will place Wound vac on negative pressure 15mm/Hg .   [name] 03/14/2017, 10:57 AM

## 2017-03-14 NOTE — Progress Notes (Signed)
Patient had 3.31 second pause on telemetry. HR and rhythm now A-fib 40-50s. Resting comfortably in bed and easily aroused. Denies CP or SOB. Alger Memos, NP notified. Will continue to monitor.

## 2017-03-15 ENCOUNTER — Inpatient Hospital Stay (HOSPITAL_COMMUNITY): Payer: Medicare Other

## 2017-03-15 LAB — CBC WITH DIFFERENTIAL/PLATELET
BASOS PCT: 0 %
Basophils Absolute: 0 10*3/uL (ref 0.0–0.1)
EOS PCT: 2 %
Eosinophils Absolute: 0.3 10*3/uL (ref 0.0–0.7)
HCT: 23.7 % — ABNORMAL LOW (ref 39.0–52.0)
Hemoglobin: 7.5 g/dL — ABNORMAL LOW (ref 13.0–17.0)
LYMPHS ABS: 1.5 10*3/uL (ref 0.7–4.0)
Lymphocytes Relative: 10 %
MCH: 26.3 pg (ref 26.0–34.0)
MCHC: 31.6 g/dL (ref 30.0–36.0)
MCV: 83.2 fL (ref 78.0–100.0)
MONO ABS: 0.9 10*3/uL (ref 0.1–1.0)
Monocytes Relative: 6 %
NEUTROS PCT: 82 %
Neutro Abs: 12 10*3/uL — ABNORMAL HIGH (ref 1.7–7.7)
PLATELETS: 313 10*3/uL (ref 150–400)
RBC: 2.85 MIL/uL — ABNORMAL LOW (ref 4.22–5.81)
RDW: 16.6 % — AB (ref 11.5–15.5)
WBC: 14.7 10*3/uL — AB (ref 4.0–10.5)

## 2017-03-15 LAB — GLUCOSE, CAPILLARY
GLUCOSE-CAPILLARY: 146 mg/dL — AB (ref 65–99)
GLUCOSE-CAPILLARY: 183 mg/dL — AB (ref 65–99)
Glucose-Capillary: 80 mg/dL (ref 65–99)
Glucose-Capillary: 138 mg/dL — ABNORMAL HIGH (ref 65–99)

## 2017-03-15 LAB — PHOSPHORUS: PHOSPHORUS: 3.3 mg/dL (ref 2.5–4.6)

## 2017-03-15 LAB — MAGNESIUM: MAGNESIUM: 1.9 mg/dL (ref 1.7–2.4)

## 2017-03-15 MED ORDER — JEVITY 1.2 CAL PO LIQD: 1000.0000 mL | ORAL | Status: DC

## 2017-03-15 MED ORDER — GLUCERNA 1.2 CAL PO LIQD
1000.0000 mL | ORAL | Status: DC
  Administered 2017-03-15 – 2017-03-17 (×3): 1000 mL
  Filled 2017-03-15 (×7): qty 1000

## 2017-03-15 MED ORDER — LORAZEPAM 1 MG PO TABS
1.0000 mg | ORAL_TABLET | Freq: Every evening | ORAL | Status: DC | PRN
  Administered 2017-03-15 – 2017-03-17 (×2): 1 mg via ORAL
  Filled 2017-03-15 (×2): qty 1

## 2017-03-15 NOTE — Plan of Care (Signed)
  Progressing Health Behavior/Discharge Planning: Ability to manage health-related needs will improve 03/15/2017 1114 - Progressing by Harlin Heys, RN Clinical Measurements: Ability to maintain clinical measurements within normal limits will improve 03/15/2017 1114 - Progressing by Harlin Heys, RN Will remain free from infection 03/15/2017 1114 - Progressing by Harlin Heys, RN Diagnostic test results will improve 03/15/2017 1114 - Progressing by Harlin Heys, RN Respiratory complications will improve 03/15/2017 1114 - Progressing by Harlin Heys, RN Cardiovascular complication will be avoided 03/15/2017 1114 - Progressing by Harlin Heys, RN Nutrition: Adequate nutrition will be maintained 03/15/2017 1114 - Progressing by Harlin Heys, RN Skin Integrity: Risk for impaired skin integrity will decrease 03/15/2017 1114 - Progressing by Harlin Heys, RN

## 2017-03-15 NOTE — Progress Notes (Signed)
Cortrak Tube Team Note:  Consult received to place a Cortrak feeding tube.   A 10 F Cortrak tube was placed in the left nare and secured with a nasal bridle at 80 cm. Per the Cortrak monitor reading the tube tip is post pyloric.   X-ray is required, abdominal x-ray has been ordered by the Cortrak team. Please confirm tube placement before using the Cortrak tube.   If the tube becomes dislodged please keep the tube and contact the Cortrak team at [email or web address] (password [email or web address]) for replacement.  If after hours and replacement cannot be delayed, place a NG tube and confirm placement with an abdominal x-ray.    Koleen Distance MS, RD, LDN Pager #- [phone] After Hours Pager: [phone]

## 2017-03-15 NOTE — Progress Notes (Signed)
Nutrition Follow-up  DOCUMENTATION CODES:   Not applicable  INTERVENTION:   Tube Feeding:  Glucerna @ 60 ml/hr Pro-Stat 30 mL daily Provides 1828 kcals, 101 g of protein, 1166 mL of free water Begin at 20 ml/hr; titrate by 10 mL q 8 hours until goal rate of 60 ml/hr  Monitor magnesium, potassium, and phosphorus daily for at least 3 days, MD to replete as needed, as pt is at risk for refeeding syndrome given inadequate po intake (0-10% of meals x 11 days).   NUTRITION DIAGNOSIS:   Inadequate oral intake related to poor appetite as evidenced by meal completion < 25%, per patient/family report.  Continues but being addressed via TF  GOAL:   Patient will meet greater than or equal to 90% of their needs  Progressing  MONITOR:   PO intake, Supplement acceptance, Labs, Weight trends  REASON FOR ASSESSMENT:   Malnutrition Screening Tool    ASSESSMENT:   84 yo male admitted with acute metabolic encephalopathy and sepsis from surgical wound infection. Pt had left hip hemiarthroplasty on 02/24/17. Pt with hx of CHF, CKD III, CAD  PO intake remains very poor; pt eating 0-10% of meals since last assessment.Pt refusing to eat.  Pt has been unable to meet nutritional needs via po intake since admission (11 days). Likes his "milk" which is the oral nutrition supplement. Noted pt has been receiving Glucerna shakes (220 kcals, 10 g of protein) instead of Ensure Enlive (350 kcals, 20 g of protein) as ordered. Pt needs the extra nutrition provided by the Ensure Enlive  Recommend Cortrak tube  be inserted today with intiation of TF   Weight relatively stable based on weight encounters.    Labs: reviewed Meds: reviewed   Diet Order:  Posterior Total Hip Precautions Fall precautions Diet Carb Modified Fluid consistency: Thin; Room service appropriate? Yes  EDUCATION NEEDS:   No education needs have been identified at this time  Skin:  Skin Assessment: Skin Integrity Issues: Skin  Integrity Issues:: Wound VAC Wound Vac: hip  Last BM:  3/10  Height:   Ht Readings from Last 1 Encounters:  03/05/17 5\' 7"  (1.702 m)    Weight:   Wt Readings from Last 1 Encounters:  03/14/17 162 lb 0.6 oz (73.5 kg)    Ideal Body Weight:     BMI:  Body mass index is 25.38 kg/m.  Estimated Nutritional Needs:   Kcal:  6203-5597 kcals  Protein:  90-110 g  Fluid:  >/= 1.7 L   Kerman Passey MS, RD, LDN, CNSC [phone] Pager  [phone] Weekend/On-Call Pager

## 2017-03-15 NOTE — Progress Notes (Signed)
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 Left hip hemiarthroplasty done 02/24/2017.  Admitted again after being found at home with left hip infection and confusion. PMHx:  a-fib, DM, CKD 3, BK infection, UTI, osteopenia, COPD, DVT, cellulitis, CAD, CABG    PT Comments    Patient progressing slowly due to pain, fear of pain and limited tolerance.  This session able to sit EOB but reluctantly.  Still needs quite a bit of help to mobilize to EOB.  Will need SNF level rehab at d/c.   Follow Up Recommendations  SNF     Equipment Recommendations  None recommended by PT    Recommendations for Other Services       Precautions / Restrictions Precautions Precautions: Fall;Posterior Hip Restrictions LLE Weight Bearing: Weight bearing as tolerated    Mobility  Bed Mobility Overal bed mobility: Needs Assistance Bed Mobility: Supine to Sit     Supine to sit: Max assist Sit to supine: Max assist;+2 for physical assistance   General bed mobility comments: assist to bring legs to EOB and to lift trunk, to supine PA in room to assist to bring shoulders down as PT lifted legs, +2 to scoot to [facility]  Transfers                 General transfer comment: NT  Ambulation/Gait                 Stairs            Wheelchair Mobility    Modified Rankin (Stroke Patients Only)       Balance Overall balance assessment: Needs assistance   Sitting balance-Leahy Scale: Poor Sitting balance - Comments: at times max A to sit, overall mod A, pt with limited tolerance and pushing back to lie down after about 3 minutes                                    Cognition Arousal/Alertness:  Awake/alert Behavior During Therapy: Anxious Overall Cognitive Status: Impaired/Different from baseline                   Orientation Level: Situation;Time Current Attention Level: Selective Memory: Decreased short-term memory;Decreased recall of precautions Following Commands: Follows one step commands inconsistently;Follows one step commands with increased time Safety/Judgement: Decreased awareness of deficits;Decreased awareness of safety   Problem Solving: Slow processing;Decreased initiation;Requires verbal cues;Requires tactile cues        Exercises General Exercises - Lower Extremity Ankle Circles/Pumps: AROM;Right;10 reps;Supine Short Arc Quad: AROM;Both;Supine;10 reps Heel Slides: 5 reps;Both;Supine;AROM;AAROM    General Comments General comments (skin integrity, edema, etc.): anxious and fearful of pain      Pertinent Vitals/Pain Pain Assessment: Faces Faces Pain Scale: Hurts even more Pain Location: L hip Pain Descriptors / Indicators: Operative site guarding;Grimacing;Sore Pain Intervention(s): Limited activity within patient's tolerance;Monitored during session;Repositioned    Home Living                      Prior Function            PT Goals (current goals can now be found in the care plan  section) Progress towards PT goals: Not progressing toward goals - comment(self limited by fear/pain)    Frequency    Min 2X/week      PT Plan Current plan remains appropriate    Co-evaluation              AM-PAC PT "6 Clicks" Daily Activity  Outcome Measure  Difficulty turning over in bed (including adjusting bedclothes, sheets and blankets)?: Unable Difficulty moving from lying on back to sitting on the side of the bed? : Unable Difficulty sitting down on and standing up from a chair with arms (e.g., wheelchair, bedside commode, etc,.)?: Unable Help needed moving to and from a bed to chair (including a wheelchair)?: Total Help needed  walking in hospital room?: Total Help needed climbing 3-5 steps with a railing? : Total 6 Click Score: 6    End of Session Equipment Utilized During Treatment: Gait belt Activity Tolerance: Patient limited by fatigue;Patient limited by pain Patient left: in bed;with call bell/phone within reach   PT Visit Diagnosis: Muscle weakness (generalized) (M62.81);History of falling (Z91.81);Difficulty in walking, not elsewhere classified (R26.2);Pain Pain - Right/Left: Left Pain - part of body: Hip     Time: 6195-0932 PT Time Calculation (min) (ACUTE ONLY): 33 min  Charges:  $Therapeutic Exercise: 8-22 mins $Therapeutic Activity: 8-22 mins                    G CodesMagda Kiel, [place] [phone] 03/15/2017    [name] 03/15/2017, 2:18 PM

## 2017-03-15 NOTE — Progress Notes (Addendum)
PROGRESS NOTE  [name] GGY:[id number] DOB: [birth year] DOA: 03/04/2017 PCP: [name], MD  HPI/Recap of past 24 hours: [name] a 84 y.o.malefrom home where he was found in his bed with a lot of bloody drainage from around his surgical site where he had a left hip surgery done on February 20 by Dr. Lorin Mercy. Admitted for acute metabolic encephalopathy and sepsis 2/2 to surgical wound infection. Patient admitted with left Hemiarthroplasty infection, underwent I and D left hip and placement of prostalac absorbable vancomycin and gentamycin beads on 3-01 by Dr Lorin Mercy. Culture from wound grew MRSA. He was also diagnosed with MRSA Bacteremia. ID has been following patient in consultation. Plan for 6 Weeks of IV antibiotics. Repeat blood culture negative to date. Patient noted to have significant drainage form left hip. Dr Lorin Mercy performed a repeat I&D on 03/10/17.  Today, met pt sleeping, but easily arousable, poor appetite, refuses to eat. Advised RN to assist with feeding. Looks comfortable. Denies chest pain, SOB, abdominal pain, fever/chills. Also met legal guardian at bedside, discussed in details about pt condition.  Assessment/Plan: Principal Problem:   Wound infection after surgery Active Problems:   Leukocytosis   Chronic diastolic CHF (congestive heart failure) (HCC)   Acute renal failure superimposed on stage 3 chronic kidney disease (HCC)   Atrial fibrillation with normal ventricular rate (HCC)   CAD (coronary artery disease)   Anemia in chronic kidney disease (CKD)   Sepsis (HCC)   Acute delirium   Hemiarthroplasty left hip MRSA infection Afebrile, downtrending leukocytosis Recent left hip surgery 02/24/17 Underwent I and D left hip and placement of prostalac absorbable vancomycin and gentamycin beads on 3-01 and 3/6 ID following, continue with vancomycin. Needs 6 weeks of IV antibiotics (till 04/22/17) Cultures from wound; Moderate staph aureus Ortho on  board  MRSA Bacteremia/Sepsis Blood culture on 3/2 NGTD On IV antibiotics.  ECHO, no evidence of vegetation Needs 6 weeks of IV antibiotics, follow by 3-6 months of oral therapy (Bactrim or Doxy, and likely long term suppression after, per ID recommendations PICC line placed  Acute metabolic encephalopathy Mental status fluctuates. ??dementia Monitor closely  Malnutrition/poor oral intake ??Dementia Nutrition consulted, rec cortrak   Cortrak placement peding  AKI Resolved s/p IVF  Chronic A Fib Rate controlled Held metoprolol on 3/9 due to episode of bradycardia/pause Continue eliquis Monitor closely  HTN Stable Continue home norvasc, clonidine  Normocytic anemia monitor hgb, currently stable   DM type 2 lantus 10 units, SSI.   Depression/anxiety Prozac  BPH Finasteride, tamsulosin   Code Status: Partial code  Family Communication: Legal guardian at bedside  Disposition Plan: Difficult disposition as pt will require 6 weeks of IV Vancomycin, SW working on placement as pt will not be able to administer IV AB due to dementia.   Consultants:  Orthopedics  ID  Procedures:  S/P I&D   Antimicrobials:  IV Vancomycin  DVT prophylaxis:  Eliquis   Objective: Vitals:   03/14/17 2310 03/15/17 0218 03/15/17 0615 03/15/17 0932  BP: (!) 113/44 (!) 137/49 (!) 145/60 (!) 148/54  Pulse: (!) 48 (!) 54 70 60  Resp:  20 18 18   Temp:   98 F (36.7 C) 98.4 F (36.9 C)  TempSrc:   Oral Oral  SpO2: 100% 100% 100% 99%  Weight:      Height:        Intake/Output Summary (Last 24 hours) at 03/15/2017 1458 Last data filed at 03/15/2017 1434 Gross per 24 hour  Intake 714 ml  Output 3175 ml  Net -2461 ml   Filed Weights   03/11/17 2125 03/13/17 2237 03/14/17 2119  Weight: 74.2 kg (163 lb 9.3 oz) 73.5 kg (162 lb) 73.5 kg (162 lb 0.6 oz)    Exam:   General:  NAD  Cardiovascular: S1, S2 present  Respiratory: CTA  Abdomen: Soft, NT, ND,  BS+  Musculoskeletal: L BKA, L hip VAC in place  Skin: Normal  Psychiatry: Fair mood   Data Reviewed: CBC: Recent Labs  Lab 03/11/17 0530 03/12/17 0357 03/13/17 0425 03/14/17 0457 03/15/17 0419  WBC 23.0* 18.6* 18.6* 17.2* 14.7*  NEUTROABS 20.3* 15.9* 16.2* 14.7* 12.0*  HGB 8.8* 7.9* 7.7* 7.7* 7.5*  HCT 28.9* 25.1* 24.4* 25.0* 23.7*  MCV 82.8 83.1 84.1 83.9 83.2  PLT 467* 361 330 364 920   Basic Metabolic Panel: Recent Labs  Lab 03/10/17 0541 03/10/17 1050 03/11/17 0530 03/12/17 0357 03/13/17 0425  NA 134* 134* 135 134* 134*  K 3.4* 3.5 3.7 3.6 3.6  CL 97* 98* 96* 95* 93*  CO2 29 29 30 28 29   GLUCOSE 199* 196* 149* 155* 152*  BUN 15 13 12 15 17   CREATININE 0.92 0.87 0.90 0.95 1.06  CALCIUM 10.7* 10.8* 10.9* 10.8* 11.0*   GFR: Estimated Creatinine Clearance: 49.4 mL/min (by C-G formula based on SCr of 1.06 mg/dL). Liver Function Tests: No results for input(s): AST, ALT, ALKPHOS, BILITOT, PROT, ALBUMIN in the last 168 hours. No results for input(s): LIPASE, AMYLASE in the last 168 hours. No results for input(s): AMMONIA in the last 168 hours. Coagulation Profile: No results for input(s): INR, PROTIME in the last 168 hours. Cardiac Enzymes: No results for input(s): CKTOTAL, CKMB, CKMBINDEX, TROPONINI in the last 168 hours. BNP (last 3 results) No results for input(s): PROBNP in the last 8760 hours. HbA1C: No results for input(s): HGBA1C in the last 72 hours. CBG: Recent Labs  Lab 03/14/17 1142 03/14/17 1644 03/14/17 2115 03/15/17 0743 03/15/17 1140  GLUCAP 170* 160* 112* 80 138*   Lipid Profile: No results for input(s): CHOL, HDL, LDLCALC, TRIG, CHOLHDL, LDLDIRECT in the last 72 hours. Thyroid Function Tests: No results for input(s): TSH, T4TOTAL, FREET4, T3FREE, THYROIDAB in the last 72 hours. Anemia Panel: No results for input(s): VITAMINB12, FOLATE, FERRITIN, TIBC, IRON, RETICCTPCT in the last 72 hours. Urine analysis:    Component Value  Date/Time   COLORURINE YELLOW 03/04/2017 0544   APPEARANCEUR HAZY (A) 03/04/2017 0544   APPEARANCEUR Cloudy (A) 11/12/2016 1101   LABSPEC 1.012 03/04/2017 0544   PHURINE 5.0 03/04/2017 0544   GLUCOSEU 50 (A) 03/04/2017 0544   HGBUR SMALL (A) 03/04/2017 0544   BILIRUBINUR NEGATIVE 03/04/2017 0544   BILIRUBINUR Negative 11/12/2016 1101   KETONESUR 5 (A) 03/04/2017 0544   PROTEINUR 100 (A) 03/04/2017 0544   UROBILINOGEN 0.2 12/19/2013 1905   NITRITE NEGATIVE 03/04/2017 0544   LEUKOCYTESUR NEGATIVE 03/04/2017 0544   LEUKOCYTESUR 2+ (A) 11/12/2016 1101   Sepsis Labs: @LABRCNTIP (procalcitonin:4,lacticidven:4)  ) Recent Results (from the past 240 hour(s))  Culture, blood (routine x 2)     Status: None   Collection Time: 03/06/17  6:32 AM  Result Value Ref Range Status   Specimen Description BLOOD LEFT ANTECUBITAL  Final   Special Requests IN PEDIATRIC BOTTLE Blood Culture adequate volume  Final   Culture   Final    NO GROWTH 5 DAYS Performed at [facility], [place] [address] [place], [place] [address]    Report Status  03/11/2017 FINAL  Final  Culture, blood (routine x 2)     Status: None   Collection Time: 03/06/17  6:38 AM  Result Value Ref Range Status   Specimen Description BLOOD LEFT ARM  Final   Special Requests   Final    IN PEDIATRIC BOTTLE Blood Culture results may not be optimal due to an excessive volume of blood received in culture bottles   Culture   Final    NO GROWTH 5 DAYS Performed at [facility], [place] [address], [place], [place] [address]    Report Status 03/11/2017 FINAL  Final      Studies: No results found.  Scheduled Meds: . amLODipine  10 mg Oral Daily  . apixaban  5 mg Oral BID  . cloNIDine  0.1 mg Oral QHS  . docusate sodium  100 mg Oral BID  . feeding supplement (ENSURE ENLIVE)  237 mL Oral TID BM  . finasteride  5 mg Oral Daily  . FLUoxetine  20 mg Oral Daily  . insulin aspart  0-15 Units Subcutaneous TID WC  . insulin glargine   10 Units Subcutaneous QHS  . pantoprazole  40 mg Oral Daily  . senna-docusate  1 tablet Oral BID  . tamsulosin  0.4 mg Oral Daily    Continuous Infusions: . vancomycin Stopped (03/15/17 1319)     LOS: 11 days     Alma Friendly, MD [facility]   If 7PM-7AM, please contact night-coverage [email or web address] Password [facility] 03/15/2017, 2:58 PM

## 2017-03-15 NOTE — Clinical Social Work Note (Signed)
[name], [place] contacted [phone]) and advised of bed offer - [facility]. Also informed POA that [facility] had not responded and he requested that this facility be contacted to determine if they can accept patient. CSW will contact [facility] on 3/12 and f/u with POA.  Genevia Bouldin Givens, MSW, LCSW Licensed Clinical Social Worker [place] [phone]

## 2017-03-16 LAB — GLUCOSE, CAPILLARY
GLUCOSE-CAPILLARY: 196 mg/dL — AB (ref 65–99)
GLUCOSE-CAPILLARY: 250 mg/dL — AB (ref 65–99)
Glucose-Capillary: 198 mg/dL — ABNORMAL HIGH (ref 65–99)
Glucose-Capillary: 191 mg/dL — ABNORMAL HIGH (ref 65–99)
Glucose-Capillary: 183 mg/dL — ABNORMAL HIGH (ref 65–99)
Glucose-Capillary: 209 mg/dL — ABNORMAL HIGH (ref 65–99)

## 2017-03-16 NOTE — Clinical Social Work Note (Signed)
CSW contacted Larene Beach, admissions director with [facility] regarding patient and facility can accept him. Larene Beach initiated and received insurance authorization. CSW followed up with MD and was advised that patient not medically stable for discharge today. Larene Beach with [employer] and patient's POA, Mr. Leron Croak advised. CSW will continue to monitor patient's progress and facilitate discharge to the skilled nursing facility once medically stable.  Shiri Hodapp Givens, MSW, LCSW Licensed Clinical Social Worker [place] [phone]

## 2017-03-16 NOTE — Progress Notes (Signed)
   Subjective: 6 Days Post-Op Procedure(s) (LRB): REPEAT IRRIGATION AND DEBRIDEMENT LEFT DEEP HIP INFECTION, ANTIBIOTIC BEAD PLACEMENT, VAC PLACEMENT (Left) Patient reports pain as mild.  Poor PO's has feeding tube.   Objective: Vital signs in last 24 hours: Temp:  [98.4 F (36.9 C)-98.9 F (37.2 C)] 98.9 F (37.2 C) (03/12 0432) Pulse Rate:  [60-82] 78 (03/12 0432) Resp:  [18-19] 19 (03/12 0432) BP: (118-148)/(54-67) 118/64 (03/12 0432) SpO2:  [99 %-100 %] 100 % (03/12 0432) Weight:  [161 lb 6 oz (73.2 kg)] 161 lb 6 oz (73.2 kg) (03/11 2017)  Intake/Output from previous day: 03/11 0701 - 03/12 0700 In: 1864 [P.O.:950; NG/GT:714; IV Piggyback:200] Out: 2850 [Urine:2850] Intake/Output this shift: No intake/output data recorded.  Recent Labs    03/14/17 0457 03/15/17 0419  HGB 7.7* 7.5*   Recent Labs    03/14/17 0457 03/15/17 0419  WBC 17.2* 14.7*  RBC 2.98* 2.85*  HCT 25.0* 23.7*  PLT 364 313   No results for input(s): NA, K, CL, CO2, BUN, CREATININE, GLUCOSE, CALCIUM in the last 72 hours. No results for input(s): LABPT, INR in the last 72 hours.  dressing removed incision clean dry , no drainage , no erythema, healing well.  Dg Abd Portable 1v  Result Date: 03/15/2017 CLINICAL DATA:  NGT PLACEMENT EXAM: PORTABLE ABDOMEN - 1 VIEW COMPARISON:  None. FINDINGS: Feeding tube with tip in the first portion duodenum. No bowel obstruction evident IMPRESSION: Feeding tube with tip in the first portion duodenum. Electronically Signed   By: Suzy Bouchard M.D.   On: 03/15/2017 16:53    Assessment/Plan: 6 Days Post-Op Procedure(s) (LRB): REPEAT IRRIGATION AND DEBRIDEMENT LEFT DEEP HIP INFECTION, ANTIBIOTIC BEAD PLACEMENT, VAC PLACEMENT (Left) Up with therapy. Continue ABX. WBC continues to drop.   [name] 03/16/2017, 8:06 AM

## 2017-03-16 NOTE — Progress Notes (Signed)
PROGRESS NOTE  OTHA MONICAL ION:[id number] DOB: 1931/10/17 DOA: 03/04/2017 PCP: [name], MD  HPI/Recap of past 24 hours: [name] a 84 y.o.malefrom home where he was found in his bed with a lot of bloody drainage from around his surgical site where he had a left hip surgery done on February 20 by Dr. Lorin Mercy. Admitted for acute metabolic encephalopathy and sepsis 2/2 to surgical wound infection. Patient admitted with left Hemiarthroplasty infection, underwent I and D left hip and placement of prostalac absorbable vancomycin and gentamycin beads on 3-01 by Dr Lorin Mercy. Culture from wound grew MRSA. He was also diagnosed with MRSA Bacteremia. ID has been following patient in consultation. Plan for 6 Weeks of IV antibiotics. Repeat blood culture negative to date. Patient noted to have significant drainage form left hip. Dr Lorin Mercy performed a repeat I&D on 03/10/17.  Today, met pt drinking ensure (that's the only thing pt agrees to eat). Looks comfortable. Denies chest pain, SOB, abdominal pain, fever/chills. Cortrak placed 03/15/17 for temp feeds. Palliative consult placed for GOC, long term plans.  Assessment/Plan: Principal Problem:   Wound infection after surgery Active Problems:   Leukocytosis   Chronic diastolic CHF (congestive heart failure) (HCC)   Acute renal failure superimposed on stage 3 chronic kidney disease (HCC)   Atrial fibrillation with normal ventricular rate (HCC)   CAD (coronary artery disease)   Anemia in chronic kidney disease (CKD)   Sepsis (HCC)   Acute delirium   Hemiarthroplasty left hip MRSA infection Afebrile, downtrending leukocytosis Recent left hip surgery 02/24/17 Underwent I and D left hip and placement of prostalac absorbable vancomycin and gentamycin beads on 3-01 and 3/6 ID following, continue with vancomycin. Needs 6 weeks of IV antibiotics (till 04/22/17) Cultures from wound; Moderate staph aureus Ortho on board  MRSA  Bacteremia/Sepsis Blood culture on 3/2 NGTD On IV antibiotics.  ECHO, no evidence of vegetation Needs 6 weeks of IV antibiotics, follow by 3-6 months of oral therapy (Bactrim or Doxy, and likely long term suppression after, per ID recommendations PICC line placed  Acute metabolic encephalopathy Mental status fluctuates. ??dementia Monitor closely  Malnutrition/poor oral intake ??Dementia Nutrition consulted, cortrak placed on 03/15/17 for feeding  AKI Resolved s/p IVF  Chronic A Fib Rate controlled Held metoprolol on 3/9 due to episode of bradycardia/pause Continue eliquis Monitor closely  HTN Stable Continue home norvasc, clonidine  Normocytic anemia monitor hgb, currently stable   DM type 2 lantus 10 units, SSI.   Depression/anxiety Prozac  BPH Finasteride, tamsulosin  GOC Palliative consult pending for GOC and for long term nutrition plans, ?PEG Vs comfort feeds   Code Status: Partial code  Family Communication: Spoke to Legal guardian at bedside on 03/15/17  Disposition Plan: Difficult disposition as pt has cortrak placed, will need long term plans in terms of feeding, hx of dementia palliative consulted.    Consultants:  Orthopedics  ID  Procedures:  S/P I&D   Antimicrobials:  IV Vancomycin  DVT prophylaxis:  Eliquis   Objective: Vitals:   03/15/17 0932 03/15/17 2017 03/16/17 0432 03/16/17 1011  BP: (!) 148/54 139/67 118/64 (!) 143/60  Pulse: 60 82 78   Resp: 18 19 19    Temp: 98.4 F (36.9 C) 98.6 F (37 C) 98.9 F (37.2 C)   TempSrc: Oral Oral Oral   SpO2: 99% 100% 100%   Weight:  73.2 kg (161 lb 6 oz)    Height:        Intake/Output Summary (Last 24  hours) at 03/16/2017 1650 Last data filed at 03/16/2017 1547 Gross per 24 hour  Intake 1619 ml  Output 1600 ml  Net 19 ml   Filed Weights   03/13/17 2237 03/14/17 2119 03/15/17 2017  Weight: 73.5 kg (162 lb) 73.5 kg (162 lb 0.6 oz) 73.2 kg (161 lb 6 oz)     Exam:   General:  NAD  Cardiovascular: S1, S2 present  Respiratory: CTA  Abdomen: Soft, NT, ND, BS+  Musculoskeletal: L BKA, L hip VAC in place  Skin: Normal  Psychiatry: Fair mood   Data Reviewed: CBC: Recent Labs  Lab 03/11/17 0530 03/12/17 0357 03/13/17 0425 03/14/17 0457 03/15/17 0419  WBC 23.0* 18.6* 18.6* 17.2* 14.7*  NEUTROABS 20.3* 15.9* 16.2* 14.7* 12.0*  HGB 8.8* 7.9* 7.7* 7.7* 7.5*  HCT 28.9* 25.1* 24.4* 25.0* 23.7*  MCV 82.8 83.1 84.1 83.9 83.2  PLT 467* 361 330 364 696   Basic Metabolic Panel: Recent Labs  Lab 03/10/17 0541 03/10/17 1050 03/11/17 0530 03/12/17 0357 03/13/17 0425 03/15/17 1803  NA 134* 134* 135 134* 134*  --   K 3.4* 3.5 3.7 3.6 3.6  --   CL 97* 98* 96* 95* 93*  --   CO2 29 29 30 28 29   --   GLUCOSE 199* 196* 149* 155* 152*  --   BUN 15 13 12 15 17   --   CREATININE 0.92 0.87 0.90 0.95 1.06  --   CALCIUM 10.7* 10.8* 10.9* 10.8* 11.0*  --   MG  --   --   --   --   --  1.9  PHOS  --   --   --   --   --  3.3   GFR: Estimated Creatinine Clearance: 49.4 mL/min (by C-G formula based on SCr of 1.06 mg/dL). Liver Function Tests: No results for input(s): AST, ALT, ALKPHOS, BILITOT, PROT, ALBUMIN in the last 168 hours. No results for input(s): LIPASE, AMYLASE in the last 168 hours. No results for input(s): AMMONIA in the last 168 hours. Coagulation Profile: No results for input(s): INR, PROTIME in the last 168 hours. Cardiac Enzymes: No results for input(s): CKTOTAL, CKMB, CKMBINDEX, TROPONINI in the last 168 hours. BNP (last 3 results) No results for input(s): PROBNP in the last 8760 hours. HbA1C: No results for input(s): HGBA1C in the last 72 hours. CBG: Recent Labs  Lab 03/15/17 2006 03/16/17 0015 03/16/17 0422 03/16/17 0747 03/16/17 1145  GLUCAP 183* 196* 198* 191* 183*   Lipid Profile: No results for input(s): CHOL, HDL, LDLCALC, TRIG, CHOLHDL, LDLDIRECT in the last 72 hours. Thyroid Function Tests: No  results for input(s): TSH, T4TOTAL, FREET4, T3FREE, THYROIDAB in the last 72 hours. Anemia Panel: No results for input(s): VITAMINB12, FOLATE, FERRITIN, TIBC, IRON, RETICCTPCT in the last 72 hours. Urine analysis:    Component Value Date/Time   COLORURINE YELLOW 03/04/2017 0544   APPEARANCEUR HAZY (A) 03/04/2017 0544   APPEARANCEUR Cloudy (A) 11/12/2016 1101   LABSPEC 1.012 03/04/2017 0544   PHURINE 5.0 03/04/2017 0544   GLUCOSEU 50 (A) 03/04/2017 0544   HGBUR SMALL (A) 03/04/2017 0544   BILIRUBINUR NEGATIVE 03/04/2017 0544   BILIRUBINUR Negative 11/12/2016 1101   KETONESUR 5 (A) 03/04/2017 0544   PROTEINUR 100 (A) 03/04/2017 0544   UROBILINOGEN 0.2 12/19/2013 1905   NITRITE NEGATIVE 03/04/2017 0544   LEUKOCYTESUR NEGATIVE 03/04/2017 0544   LEUKOCYTESUR 2+ (A) 11/12/2016 1101   Sepsis Labs: @LABRCNTIP (procalcitonin:4,lacticidven:4)  ) No results found for this or any previous visit (  from the past 240 hour(s)).    Studies: No results found.  Scheduled Meds: . amLODipine  10 mg Oral Daily  . apixaban  5 mg Oral BID  . cloNIDine  0.1 mg Oral QHS  . docusate sodium  100 mg Oral BID  . feeding supplement (ENSURE ENLIVE)  237 mL Oral TID BM  . finasteride  5 mg Oral Daily  . FLUoxetine  20 mg Oral Daily  . insulin aspart  0-15 Units Subcutaneous TID WC  . insulin glargine  10 Units Subcutaneous QHS  . pantoprazole  40 mg Oral Daily  . senna-docusate  1 tablet Oral BID  . tamsulosin  0.4 mg Oral Daily    Continuous Infusions: . feeding supplement (GLUCERNA 1.2 CAL) 1,000 mL (03/16/17 1053)  . vancomycin Stopped (03/16/17 1336)     LOS: 12 days     Alma Friendly, MD [facility]   If 7PM-7AM, please contact night-coverage [email or web address] Password [facility] 03/16/2017, 4:50 PM

## 2017-03-16 NOTE — Care Management Important Message (Signed)
Important Message  Patient Details  Name: [name] MRN: [id number] Date of Birth: 1931/10/22   Medicare Important Message Given:  Yes    Carles Collet, RN 03/16/2017, 1:51 PM

## 2017-03-16 NOTE — Progress Notes (Signed)
Per nurse (via nursing student), "don't worry about any labs right now". I pulled this patient up in [facility] and did not see any pending labs. [name]

## 2017-03-17 DIAGNOSIS — R630 Anorexia: Secondary | ICD-10-CM

## 2017-03-17 DIAGNOSIS — Z515 Encounter for palliative care: Secondary | ICD-10-CM

## 2017-03-17 DIAGNOSIS — Z7189 Other specified counseling: Secondary | ICD-10-CM

## 2017-03-17 LAB — BASIC METABOLIC PANEL
Anion gap: 9 (ref 5–15)
BUN: 33 mg/dL — ABNORMAL HIGH (ref 6–20)
CHLORIDE: 94 mmol/L — AB (ref 101–111)
CO2: 29 mmol/L (ref 22–32)
CREATININE: 1.16 mg/dL (ref 0.61–1.24)
Calcium: 11 mg/dL — ABNORMAL HIGH (ref 8.9–10.3)
GFR calc non Af Amer: 56 mL/min — ABNORMAL LOW (ref >60)
Glucose, Bld: 276 mg/dL — ABNORMAL HIGH (ref 65–99)
POTASSIUM: 4.6 mmol/L (ref 3.5–5.1)
SODIUM: 132 mmol/L — AB (ref 135–145)

## 2017-03-17 LAB — GLUCOSE, CAPILLARY
GLUCOSE-CAPILLARY: 217 mg/dL — AB (ref 65–99)
Glucose-Capillary: 121 mg/dL — ABNORMAL HIGH (ref 65–99)
Glucose-Capillary: 174 mg/dL — ABNORMAL HIGH (ref 65–99)
Glucose-Capillary: 232 mg/dL — ABNORMAL HIGH (ref 65–99)
Glucose-Capillary: 250 mg/dL — ABNORMAL HIGH (ref 65–99)

## 2017-03-17 LAB — CBC
HCT: 23.6 % — ABNORMAL LOW (ref 39.0–52.0)
Hemoglobin: 7.5 g/dL — ABNORMAL LOW (ref 13.0–17.0)
MCH: 26.4 pg (ref 26.0–34.0)
MCHC: 31.8 g/dL (ref 30.0–36.0)
MCV: 83.1 fL (ref 78.0–100.0)
PLATELETS: 328 10*3/uL (ref 150–400)
RBC: 2.84 MIL/uL — ABNORMAL LOW (ref 4.22–5.81)
RDW: 16.5 % — ABNORMAL HIGH (ref 11.5–15.5)
WBC: 17 10*3/uL — ABNORMAL HIGH (ref 4.0–10.5)

## 2017-03-17 LAB — VANCOMYCIN, TROUGH: Vancomycin Tr: 19 ug/mL (ref 15–20)

## 2017-03-17 MED ORDER — VANCOMYCIN HCL 500 MG IV SOLR
500.0000 mg | INTRAVENOUS | Status: DC
  Administered 2017-03-17 – 2017-03-19 (×3): 500 mg via INTRAVENOUS
  Filled 2017-03-17 (×3): qty 500

## 2017-03-17 MED ORDER — SENNOSIDES-DOCUSATE SODIUM 8.6-50 MG PO TABS
2.0000 | ORAL_TABLET | Freq: Two times a day (BID) | ORAL | Status: DC
  Administered 2017-03-17 – 2017-03-19 (×5): 2 via ORAL
  Filled 2017-03-17 (×5): qty 2

## 2017-03-17 MED ORDER — HYDRALAZINE HCL 10 MG PO TABS
10.0000 mg | ORAL_TABLET | Freq: Two times a day (BID) | ORAL | Status: DC
  Administered 2017-03-17 – 2017-03-19 (×6): 10 mg via ORAL
  Filled 2017-03-17 (×6): qty 1

## 2017-03-17 MED ORDER — ACETAMINOPHEN 325 MG PO TABS
650.0000 mg | ORAL_TABLET | Freq: Four times a day (QID) | ORAL | Status: DC
  Administered 2017-03-17 – 2017-03-19 (×8): 650 mg via ORAL
  Filled 2017-03-17 (×8): qty 2

## 2017-03-17 MED ORDER — OXYCODONE HCL 5 MG PO TABS
5.0000 mg | ORAL_TABLET | ORAL | Status: DC | PRN
  Administered 2017-03-17 – 2017-03-18 (×4): 5 mg via ORAL
  Filled 2017-03-17 (×4): qty 1

## 2017-03-17 NOTE — Progress Notes (Signed)
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 Left hip hemiarthroplasty done 02/24/2017.  Admitted again after being found at home with left hip infection and confusion. PMHx:  a-fib, DM, CKD 3, BK infection, UTI, osteopenia, COPD, DVT, cellulitis, CAD, CABG   OT comments  Pt progressing slowly towards acute OT goals. Focus of session was bed mobility with pt completing partial roll to right 2x for placement and removal of bed pan. Pt was total A though did maintain LUE placed on R bed rail during roll. Pillow between LEs and assist to maintain LLE hip in neutral position. Pt anxious and tearful at times, resistant to movement citing pain.   Follow Up Recommendations  SNF;Supervision/Assistance - 24 hour    Equipment Recommendations  Other (comment)(TBD at next venue of care)    Recommendations for Other Services      Precautions / Restrictions Precautions Precautions: Fall;Posterior Hip Precaution Comments: confused Required Braces or Orthoses: (LLE prosthesis) Restrictions Weight Bearing Restrictions: Yes LLE Weight Bearing: Weight bearing as tolerated       Mobility Bed Mobility Overal bed mobility: Needs Assistance Bed Mobility: Rolling Rolling: Total assist;+2 for safety/equipment         General bed mobility comments: total A to partial roll to right side 2x. Pillow between legs and assist provided to maintain LLE posterior hip precautions. Pt resisting movements due to pain, confusion, and anxiety.   Transfers                      Balance                                           ADL either performed or assessed with clinical judgement   ADL Overall ADL's : Needs  assistance/impaired Eating/Feeding: Set up;Bed level Eating/Feeding Details (indicate cue type and reason): Encouragment needed to self-assist to bring cup of water to mouth.                          Toileting- Clothing Manipulation and Hygiene: Total assistance;Bed level Toileting - Clothing Manipulation Details (indicate cue type and reason): partial roll to right for placement and removal of bed pan.        General ADL Comments: Pt reporting feeling need to have a bowel movement at start of session. Pt resistive to any mobility due to pain and anxiety.      Vision       Perception     Praxis      Cognition Arousal/Alertness: Awake/alert Behavior During Therapy: Anxious;Flat affect Overall Cognitive Status: Impaired/Different from baseline Area of Impairment: Attention;Memory;Following commands;Safety/judgement;Awareness;Problem solving;Orientation                 Orientation Level: Situation;Time Current Attention Level: Selective Memory: Decreased short-term memory;Decreased recall of precautions Following Commands: Follows one step commands inconsistently;Follows one step commands with increased time Safety/Judgement: Decreased awareness of deficits;Decreased awareness of safety Awareness: Intellectual Problem Solving: Slow processing;Decreased initiation;Requires verbal cues;Requires tactile cues General Comments: perseverates, confused, anxious, decreased problem solving, needs multimodal cues.  Exercises     Shoulder Instructions       General Comments Anxious and tearful at times.    Pertinent Vitals/ Pain       Pain Assessment: Faces Faces Pain Scale: Hurts whole lot Pain Location: L hip Pain Descriptors / Indicators: Operative site guarding;Grimacing;Sore Pain Intervention(s): Limited activity within patient's tolerance;Monitored during session;Repositioned;Patient requesting pain meds-RN notified  Home Living                                           Prior Functioning/Environment              Frequency  Min 2X/week        Progress Toward Goals  OT Goals(current goals can now be found in the care plan section)  Progress towards OT goals: Progressing toward goals  Acute Rehab OT Goals Patient Stated Goal: to drink water OT Goal Formulation: With patient Time For Goal Achievement: 03/23/17 Potential to Achieve Goals: Good ADL Goals Pt Will Perform Grooming: with min assist;with min guard assist;sitting Pt Will Perform Upper Body Bathing: with min assist;sitting Pt Will Perform Lower Body Bathing: with max assist;with mod assist;sitting/lateral leans;with caregiver independent in assisting Pt Will Perform Upper Body Dressing: with min assist;with min guard assist;sitting Pt Will Transfer to Toilet: with min assist;squat pivot transfer;stand pivot transfer;anterior/posterior transfer;bedside commode Additional ADL Goal #1: pt will complete be dmobility with mod - min A to sit EOB in prep for selfcare  Plan Discharge plan remains appropriate    Co-evaluation                 AM-PAC PT "6 Clicks" Daily Activity     Outcome Measure   Help from another person eating meals?: A Little Help from another person taking care of personal grooming?: A Lot Help from another person toileting, which includes using toliet, bedpan, or urinal?: Total Help from another person bathing (including washing, rinsing, drying)?: Total Help from another person to put on and taking off regular upper body clothing?: A Lot Help from another person to put on and taking off regular lower body clothing?: Total 6 Click Score: 10    End of Session    OT Visit Diagnosis: Muscle weakness (generalized) (M62.81);Other symptoms and signs involving cognitive function;Pain;History of falling (Z91.81) Pain - Right/Left: Left Pain - part of body: Hip   Activity Tolerance Patient limited by pain   Patient  Left in chair;with call bell/phone within reach;with chair alarm set   Nurse Communication Patient requests pain meds;Mobility status        Time: 3568-6168 OT Time Calculation (min): 32 min  Charges: OT General Charges $OT Visit: 1 Visit OT Treatments $Self Care/Home Management : 23-37 mins     Hortencia Pilar 03/17/2017, 12:43 PM

## 2017-03-17 NOTE — Progress Notes (Signed)
Inpatient Diabetes Program Recommendations  AACE/ADA: New Consensus Statement on Inpatient Glycemic Control (2015)  Target Ranges:  Prepandial:   less than 140 mg/dL      Peak postprandial:   less than 180 mg/dL (1-2 hours)      Critically ill patients:  140 - 180 mg/dL   Lab Results  Component Value Date   GLUCAP 250 (H) 03/17/2017   HGBA1C 8.0 (H) 11/14/2016    Review of Glycemic Control Results for [name], [name] (MRN [id number]) as of 03/17/2017 08:35  Ref. Range 03/16/2017 11:45 03/16/2017 17:24 03/16/2017 20:09 03/17/2017 00:00 03/17/2017 07:47  Glucose-Capillary Latest Ref Range: 65 - 99 mg/dL 183 (H) 250 (H) 209 (H) 232 (H) 250 (H)   Diabetes history: DM2 Outpatient Diabetes medications: Lantus 15 (not taking) + Humalog 20 units if > 200 Current orders for Inpatient glycemic control: Lantus 10 + Nov mod tid  Inpatient Diabetes Program Recommendations:   -Add Novolog 4 units tid if eats 50%  Thank you, Bethena Roys E. Chaim Gatley, RN, MSN, CDE  Diabetes Coordinator Inpatient Glycemic Control Team Team Pager [phone] (8am-5pm) 03/17/2017 8:37 AM

## 2017-03-17 NOTE — Progress Notes (Signed)
Pharmacy Antibiotic Note  [name] is a 84 y.o. male admitted on 03/04/2017 with confusion/hip pain from surgical site from hip surgery done on 2/20. Pharmacy is consulted for Vancomycin dosing.  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 S/p I&D and placement of abx beads on 3/1. Not candidate for rifampin. Planning 6 weeks of IV abx followed by long term oral suppression.   Vancomycin dose was reduced after elevated trough on 3/9. Vancomycin trough drawn today was in range at 67mcg/mL, but was drawn a few hours late so true trough is greater than that. SCr has been trending up slightly over past few days.  Plan: Continue vancomycin at 500 mg IV Q24h Will check new trough in 3-4 days; watch renal function closely Monitor clinical picture, changes to above plan  Height: 5\' 7"  (170.2 cm) Weight: 161 lb 6.1 oz (73.2 kg) IBW/kg (Calculated) : 66.1  Temp (24hrs), Avg:98.2 F (36.8 C), Min:98 F (36.7 C), Max:98.4 F (36.9 C)  Recent Labs  Lab 03/11/17 0530 03/12/17 0357 03/13/17 0425 03/13/17 1121 03/14/17 0457 03/15/17 0419 03/17/17 0453 03/17/17 1130  WBC 23.0* 18.6* 18.6*  --  17.2* 14.7* 17.0*  --   CREATININE 0.90 0.95 1.06  --   --   --  1.16  --   VANCOTROUGH  --   --   --  33*  --   --   --  19    Estimated Creatinine Clearance: 45.1 mL/min (by C-G formula based on SCr of 1.16 mg/dL).    No Known Allergies  Antimicrobials this admission: Vancomycin 2/28 >> Zosyn 2/28 >> 3/1  Dose adjustments this admission: 3/4: Empiric change of vanc from 1g Q24 to 750mg  Q12  3/6 VT = 27 Decrease dose back down to 1g Q24 3/9 VT = 33 decrease to 500 mg q24h 3/13 VT = 19; drawn late > cont 500 q24h  Microbiology results: 2/28 BCx: 3/4 MRSE 2/28 BCx: MRSA 3/1 WCx: MRSA 2/28 UCx: MRSA 3/2 BCx: neg  Toniya Rozar D. Goldia Ligman, PharmD, BCPS Clinical Pharmacist Clinical Phone for 03/17/2017 until 3:30pm: [phone] If after 3:30pm, please call main pharmacy at  [phone] 03/17/2017 4:05 PM

## 2017-03-17 NOTE — Progress Notes (Signed)
[facility]                                                                              Patient Demographics  [name], is a 84 y.o. male, DOB - [birth year], YWV:[id number]  Admit date - 03/04/2017   Admitting Physician Murlean Iba, MD  Outpatient Primary MD for the patient is [name], MD  Outpatient specialists:   LOS - 13  days   Medical records reviewed and are as summarized below:    Chief Complaint  Patient presents with  . Hip Pain       Brief summary   Phillip Stratton Chrismanis a 84 y.o.malefrom home where he was found in his bed with a lot of bloody drainage from around his surgical site where he had a left hip surgery done on February 20 by Dr. Lorin Mercy. Admitted for acute metabolic encephalopathy and sepsis 2/2 to surgical wound infection. Patient admitted with left Hemiarthroplasty infection, underwent I and D left hip and placement of prostalac absorbable vancomycin and gentamycin beads on 3-01by Dr Lorin Mercy. Culture from wound grew MRSA. He was also diagnosed with MRSA Bacteremia. ID has been following patient in consultation. Plan for 6 Weeks of IV antibiotics. Repeat blood culture negative to date. Patient noted to have significant drainage form left hip. Dr Lorin Mercy performed a repeat I&D on 03/10/17. Palliative medicine consulted for goals of care and for long-term plans for feeding, sinus pauses.   Assessment & Plan    Left hip MRSA deep infection -Orthopedics has been following, patient had recent left hip surgery on 02/24/17, subsequently underwent I&D of the left hip and placement of absorbable vancomycin and gentamicin beads on 3/1 and 3/6 -ID following, recommended continue with vancomycin, 6 weeks of IV antibiotics till 04/22/17 -Intraoperative wound cultures showed moderate staph aureus  MRSA bacteremia/sepsis -Continue IV vancomycin -2D echo on 03/09/17 showed EF of 55-60%, no evidence of vegetation -Per ID, needs to 6 weeks of  IV vancomycin followed by 3-6 months of oral therapy (Bactrim or doxycycline and likely long-term suppression after) -PICC line placed  Acute metabolic encephalopathy in the setting of dementia -Waxing and waning mental status with malnutrition and poor oral intake, refuses to eat -Nutrition was consulted and "crack placed on 3/11.  Tube feeds initiated. -Palliative medicine consulted for goals of care, long-term feeding plan with PEG tube versus comfort feeds  Sinus pauses with atrial fibrillation -Patient has history of chronic atrial fibrillation, metoprolol was held since 3/9 due to episodes of bradycardia/pause  -Discontinued clonidine, EKG showed atrial fibrillation with right bundle branch block -Palliative consulted for goals of care  Acute kidney injury -Resolved, creatinine 1.9 on 2/18 on admission  Hypertension -Continue Norvasc, added low-dose hydralazine, discontinued clonidine  Normocytic anemia -Monitor H&H, currently stable, hemoglobin 7.5, transfuse if less than 7  Diabetes mellitus type 2 -CBGs currently stable, continue Lantus, sliding scale insulin  BPH Continue finasteride, tamsulosin  Depression/anxiety Continue Prozac  Code Status: Partial code DVT Prophylaxis: Eliquis Family Communication: No family member at the bedside   Disposition Plan: Waiting for palliative goals of care  Time Spent in minutes  35 minutes  Procedures:  Status post I&D  Consultants:   Infectious disease Orthopedics  Antimicrobials:      Medications  Scheduled Meds: . amLODipine  10 mg Oral Daily  . apixaban  5 mg Oral BID  . docusate sodium  100 mg Oral BID  . feeding supplement (ENSURE ENLIVE)  237 mL Oral TID BM  . finasteride  5 mg Oral Daily  . FLUoxetine  20 mg Oral Daily  . hydrALAZINE  10 mg Oral BID  . insulin aspart  0-15 Units Subcutaneous TID WC  . insulin glargine  10 Units Subcutaneous QHS  . pantoprazole  40 mg Oral Daily  . senna-docusate  1  tablet Oral BID  . tamsulosin  0.4 mg Oral Daily   Continuous Infusions: . feeding supplement (GLUCERNA 1.2 CAL) 1,000 mL (03/17/17 0246)   PRN Meds:.acetaminophen **OR** acetaminophen, HYDROcodone-acetaminophen, ipratropium-albuterol, LORazepam, menthol-cetylpyridinium **OR** phenol, metoCLOPramide **OR** metoCLOPramide (REGLAN) injection, nitroGLYCERIN, ondansetron **OR** ondansetron (ZOFRAN) IV, sodium chloride flush   Antibiotics   Anti-infectives (From admission, onward)   Start     Dose/Rate Route Frequency Ordered Stop   03/14/17 1200  vancomycin (VANCOCIN) 500 mg in sodium chloride 0.9 % 100 mL IVPB  Status:  Discontinued     500 mg 100 mL/hr over 60 Minutes Intravenous Every 24 hours 03/13/17 1231 03/17/17 1146   03/10/17 1736  vancomycin (VANCOCIN) powder  Status:  Discontinued       As needed 03/10/17 1736 03/10/17 1812   03/10/17 1735  gentamicin (GARAMYCIN) injection  Status:  Discontinued       As needed 03/10/17 1736 03/10/17 1812   03/10/17 1704  ceFAZolin (ANCEF) 2-4 GM/100ML-% IVPB    Comments:  Lance Coon   : cabinet override      03/10/17 1704 03/11/17 0514   03/10/17 1200  vancomycin (VANCOCIN) IVPB 1000 mg/200 mL premix  Status:  Discontinued     1,000 mg 200 mL/hr over 60 Minutes Intravenous Every 24 hours 03/10/17 0758 03/13/17 1156   03/08/17 1800  vancomycin (VANCOCIN) IVPB 750 mg/150 ml premix  Status:  Discontinued     750 mg 150 mL/hr over 60 Minutes Intravenous Every 12 hours 03/08/17 1141 03/10/17 0659   03/06/17 0600  vancomycin (VANCOCIN) IVPB 1000 mg/200 mL premix  Status:  Discontinued     1,000 mg 200 mL/hr over 60 Minutes Intravenous Every 24 hours 03/05/17 1316 03/08/17 1141   03/05/17 1141  gentamicin (GARAMYCIN) injection  Status:  Discontinued       As needed 03/05/17 1142 03/05/17 1202   03/05/17 1138  vancomycin (VANCOCIN) powder  Status:  Discontinued       As needed 03/05/17 1138 03/05/17 1202   03/05/17 0600  vancomycin (VANCOCIN)  IVPB 750 mg/150 ml premix  Status:  Discontinued     750 mg 150 mL/hr over 60 Minutes Intravenous Every 24 hours 03/04/17 1238 03/05/17 1316   03/04/17 1400  piperacillin-tazobactam (ZOSYN) IVPB 3.375 g  Status:  Discontinued     3.375 g 12.5 mL/hr over 240 Minutes Intravenous Every 8 hours 03/04/17 1224 03/05/17 1408   03/04/17 0645  piperacillin-tazobactam (ZOSYN) IVPB 3.375 g     3.375 g 100 mL/hr over 30 Minutes Intravenous  Once 03/04/17 0643 03/04/17 0746   03/04/17 0645  vancomycin (VANCOCIN) IVPB 1000 mg/200 mL premix     1,000 mg 200 mL/hr over 60 Minutes Intravenous  Once 03/04/17 0643 03/04/17 0859        Subjective:  [name] was seen and examined today.  Opens eyes and states does not feel too good but difficult to obtain review of systems due to dementia. No acute events overnight.  No fevers.  Objective:   Vitals:   03/16/17 1011 03/16/17 1700 03/16/17 2010 03/17/17 1248  BP: (!) 143/60 120/69 (!) 155/62 (!) 145/75  Pulse:  76 78 70  Resp:  18 17 16   Temp:  98.4 F (36.9 C) 98 F (36.7 C)   TempSrc:  Oral Oral   SpO2:  96% 97% 100%  Weight:   73.2 kg (161 lb 6.1 oz)   Height:        Intake/Output Summary (Last 24 hours) at 03/17/2017 1259 Last data filed at 03/17/2017 0453 Gross per 24 hour  Intake 1427 ml  Output 950 ml  Net 477 ml     Wt Readings from Last 3 Encounters:  03/16/17 73.2 kg (161 lb 6.1 oz)  02/24/17 74.4 kg (164 lb 0.4 oz)  12/08/16 77.1 kg (170 lb)     Exam  General: Alert and oriented x self, NAD  Eyes:   HEENT:  Atraumatic, normocephalic  Cardiovascular: S1 S2 auscultated, irregularly regular  Respiratory: Clear to auscultation bilaterally, no wheezing, rales or rhonchi  Gastrointestinal: Soft, nontender, nondistended, + bowel sounds  Ext: left BKA, left hip VAC  Neuro: unable to assess  Musculoskeletal: No digital cyanosis, clubbing  Skin: No rashes  Psych: flat affect   Data Reviewed:  I have  personally reviewed following labs and imaging studies  Micro Results No results found for this or any previous visit (from the past 240 hour(s)).  Radiology Reports Dg Chest 1 View  Result Date: 03/04/2017 CLINICAL DATA:  84 year old male with fever. EXAM: CHEST 1 VIEW COMPARISON:  Chest radiograph dated 02/22/2017 FINDINGS: There is mild cardiomegaly. Median sternotomy wires and CABG vascular clips. No focal consolidation, pleural effusion, or pneumothorax. No acute osseous pathology. IMPRESSION: No acute cardiopulmonary process. Cardiomegaly. Electronically Signed   By: Anner Crete M.D.   On: 03/04/2017 06:38   Dg Chest 1 View  Result Date: 02/22/2017 CLINICAL DATA:  Left hip pain status post fall. History of COPD and hypertension. EXAM: CHEST 1 VIEW COMPARISON:  Radiographs 11/13/2016 and 11/17/2016. FINDINGS: 1129 hr. Stable cardiomegaly status post median sternotomy and CABG. There is aortic atherosclerosis. Persistent low lung volumes with vascular congestion. No overt pulmonary edema, confluent airspace opacity or significant pleural effusion. IMPRESSION: Cardiomegaly and vascular congestion. Electronically Signed   By: Richardean Sale M.D.   On: 02/22/2017 12:01   Dg Chest 2 View  Result Date: 03/04/2017 CLINICAL DATA:  84 y/o  M; preop evaluation. EXAM: CHEST  2 VIEW COMPARISON:  03/04/2017 chest radiograph FINDINGS: Stable cardiomegaly given projection and technique. Post median sternotomy with wires in alignment. Aortic atherosclerosis with calcification. Stable mild biapical pleuroparenchymal scarring. Pulmonary vascular congestion. No consolidation. No pleural effusion or pneumothorax. No acute osseous abnormality is evident. IMPRESSION: Stable cardiomegaly. Mild pulmonary vascular congestion. No consolidation. Aortic atherosclerosis. Electronically Signed   By: Kristine Garbe M.D.   On: 03/04/2017 23:55   Ct Hip Left W Contrast  Result Date: 03/04/2017 CLINICAL DATA:   Pt arrived from home via [facility]. Pt was found in bed this morning with serosanguinous drainage around him draining from recent surgery on his left hip. EXAM: CT OF THE LOWER LEFT EXTREMITY WITH CONTRAST TECHNIQUE: Multidetector CT imaging of the lower left extremity was performed according to the standard protocol following intravenous contrast  administration. COMPARISON:  None. CONTRAST:  160mL ISOVUE-300 IOPAMIDOL (ISOVUE-300) INJECTION 61% FINDINGS: Bones/Joint/Cartilage Interval total left hip arthroplasty. Alignment is anatomic. No hardware failure or complication. Beam hardening artifact resulting from the orthopedic hardware partially obscures adjacent soft tissue and osseous structures. No fracture or dislocation. Normal alignment. No joint effusion. Ligaments Ligaments are suboptimally evaluated by CT. Muscles and Tendons Ill-defined area complex fluid along the greater trochanter with multiple locules of air within the fluid which may reflect a postoperative hematoma, but a secondarily infected collection cannot be completely excluded. Small volume of air within left gluteal musculature and along the superficial aspect of the vastus lateralis which may be postsurgical, but infection cannot be excluded given surgery was performed 8 days ago. Soft tissue Severe soft tissue edema in the subcutaneous fat with skin thickening overlying the left hip which may reflect postsurgical edema versus cellulitis. No soft tissue mass. Fat containing left inguinal hernia. IMPRESSION: 1. Ill-defined area complex fluid along the greater trochanter with multiple locules of air within the fluid which may reflect a postoperative hematoma, but a secondarily infected collection cannot be completely excluded. 2. Small volume of air within left gluteal musculature and along the superficial aspect of the vastus lateralis which may be postsurgical, but infection cannot be excluded given surgery was performed 8 days ago. 3. Interval  total left hip arthroplasty. Alignment is anatomic. No hardware failure or complication. Beam hardening artifact resulting from the orthopedic hardware partially obscures adjacent soft tissue and osseous structures. Electronically Signed   By: Kathreen Devoid   On: 03/04/2017 08:06   Dg Chest Port 1 View  Result Date: 03/10/2017 CLINICAL DATA:  Line placement EXAM: PORTABLE CHEST 1 VIEW COMPARISON:  03/06/2017 FINDINGS: Right PICC line is been placed with the tip at the cavoatrial junction. Prior CABG. Cardiomegaly. Minimal left basilar density, likely atelectasis. No confluent opacity on the right. No effusions or acute bony abnormality. IMPRESSION: Right PICC line tip at the cavoatrial junction. Cardiomegaly. Left base atelectasis. Electronically Signed   By: Rolm Baptise M.D.   On: 03/10/2017 10:49   Dg Chest Port 1 View  Result Date: 03/06/2017 CLINICAL DATA:  84 year old male with shortness of breath and left chest pain for 1 hour. EXAM: PORTABLE CHEST 1 VIEW COMPARISON:  03/04/2017 and earlier. FINDINGS: Portable AP semi upright view at 1059 hours. Prior CABG. Stable cardiac size and mediastinal contours. Visualized tracheal air column is within normal limits. Mildly lower lung volumes. Allowing for portable technique the lungs are clear. No acute osseous abnormality identified. IMPRESSION: No acute cardiopulmonary abnormality. Electronically Signed   By: Genevie Ann M.D.   On: 03/06/2017 12:27   Dg Abd Portable 1v  Result Date: 03/15/2017 CLINICAL DATA:  NGT PLACEMENT EXAM: PORTABLE ABDOMEN - 1 VIEW COMPARISON:  None. FINDINGS: Feeding tube with tip in the first portion duodenum. No bowel obstruction evident IMPRESSION: Feeding tube with tip in the first portion duodenum. Electronically Signed   By: Suzy Bouchard M.D.   On: 03/15/2017 16:53   Dg Hip Port Unilat With Pelvis 1v Left  Result Date: 02/26/2017 CLINICAL DATA:  Postop left hip arthroplasty. EXAM: DG HIP (WITH OR WITHOUT PELVIS) 1V PORT  LEFT COMPARISON:  02/22/2017 FINDINGS: Sequelae of interval left hip hemiarthroplasty are identified. The femoral prosthesis is approximated with the native acetabulum. Postoperative gas is noted in the adjacent soft tissues, and skin staples are in place. Atherosclerotic vascular calcifications are noted. IMPRESSION: Interval left hip hemiarthroplasty without evidence of acute complication. Electronically  Signed   By: Logan Bores M.D.   On: 02/26/2017 15:29   Dg Hip Unilat W Or Wo Pelvis 2-3 Views Left  Result Date: 02/22/2017 CLINICAL DATA:  Left hip pain after fall.  Initial encounter. EXAM: DG HIP (WITH OR WITHOUT PELVIS) 2-3V LEFT COMPARISON:  None. FINDINGS: Acute left femoral neck fracture with mild to moderate displacement. No evidence of pelvic ring fracture or diastasis. Osteopenia and atherosclerosis. IMPRESSION: Displaced left femoral neck fracture. Electronically Signed   By: Monte Fantasia M.D.   On: 02/22/2017 11:54   [place] Ekg Site Rite  Result Date: 03/10/2017 If Site Rite image not attached, placement could not be confirmed due to current cardiac rhythm.   Lab Data:  CBC: Recent Labs  Lab 03/11/17 0530 03/12/17 0357 03/13/17 0425 03/14/17 0457 03/15/17 0419 03/17/17 0453  WBC 23.0* 18.6* 18.6* 17.2* 14.7* 17.0*  NEUTROABS 20.3* 15.9* 16.2* 14.7* 12.0*  --   HGB 8.8* 7.9* 7.7* 7.7* 7.5* 7.5*  HCT 28.9* 25.1* 24.4* 25.0* 23.7* 23.6*  MCV 82.8 83.1 84.1 83.9 83.2 83.1  PLT 467* 361 330 364 313 174   Basic Metabolic Panel: Recent Labs  Lab 03/11/17 0530 03/12/17 0357 03/13/17 0425 03/15/17 1803 03/17/17 0453  NA 135 134* 134*  --  132*  K 3.7 3.6 3.6  --  4.6  CL 96* 95* 93*  --  94*  CO2 30 28 29   --  29  GLUCOSE 149* 155* 152*  --  276*  BUN 12 15 17   --  33*  CREATININE 0.90 0.95 1.06  --  1.16  CALCIUM 10.9* 10.8* 11.0*  --  11.0*  MG  --   --   --  1.9  --   PHOS  --   --   --  3.3  --    GFR: Estimated Creatinine Clearance: 45.1 mL/min (by C-G  formula based on SCr of 1.16 mg/dL). Liver Function Tests: No results for input(s): AST, ALT, ALKPHOS, BILITOT, PROT, ALBUMIN in the last 168 hours. No results for input(s): LIPASE, AMYLASE in the last 168 hours. No results for input(s): AMMONIA in the last 168 hours. Coagulation Profile: No results for input(s): INR, PROTIME in the last 168 hours. Cardiac Enzymes: No results for input(s): CKTOTAL, CKMB, CKMBINDEX, TROPONINI in the last 168 hours. BNP (last 3 results) No results for input(s): PROBNP in the last 8760 hours. HbA1C: No results for input(s): HGBA1C in the last 72 hours. CBG: Recent Labs  Lab 03/16/17 1724 03/16/17 2009 03/17/17 0000 03/17/17 0747 03/17/17 1207  GLUCAP 250* 209* 232* 250* 217*   Lipid Profile: No results for input(s): CHOL, HDL, LDLCALC, TRIG, CHOLHDL, LDLDIRECT in the last 72 hours. Thyroid Function Tests: No results for input(s): TSH, T4TOTAL, FREET4, T3FREE, THYROIDAB in the last 72 hours. Anemia Panel: No results for input(s): VITAMINB12, FOLATE, FERRITIN, TIBC, IRON, RETICCTPCT in the last 72 hours. Urine analysis:    Component Value Date/Time   COLORURINE YELLOW 03/04/2017 0544   APPEARANCEUR HAZY (A) 03/04/2017 0544   APPEARANCEUR Cloudy (A) 11/12/2016 1101   LABSPEC 1.012 03/04/2017 0544   PHURINE 5.0 03/04/2017 0544   GLUCOSEU 50 (A) 03/04/2017 0544   HGBUR SMALL (A) 03/04/2017 0544   BILIRUBINUR NEGATIVE 03/04/2017 0544   BILIRUBINUR Negative 11/12/2016 1101   KETONESUR 5 (A) 03/04/2017 0544   PROTEINUR 100 (A) 03/04/2017 0544   UROBILINOGEN 0.2 12/19/2013 1905   NITRITE NEGATIVE 03/04/2017 0544   LEUKOCYTESUR NEGATIVE 03/04/2017 0544   LEUKOCYTESUR 2+ (  A) 11/12/2016 1101     Eli Pattillo M.D. [facility] 03/17/2017, 12:59 PM  Pager: [phone] Between 7am to 7pm - call Pager - [phone]  After 7pm go to [email or web address] - password [email or web address]  Call night coverage person covering after 7pm

## 2017-03-17 NOTE — Progress Notes (Signed)
Called to verify wound care orders for left hip incision. Betadine swap with dry dressing q shift per yates

## 2017-03-17 NOTE — Progress Notes (Signed)
Per tele patient had 2 pauses, both <3s. MD aware.

## 2017-03-17 NOTE — Consult Note (Signed)
Consultation Note Date: 03/17/2017   Patient Name: [name]  DOB: Feb 14, 1931  MRN: [id number]  Age / Sex: 84 y.o., male  PCP: [name], MD Referring Physician: Mendel Corning, MD  Reason for Consultation: Establishing goals of care, Non pain symptom management, Pain control and Psychosocial/spiritual support  HPI/Patient Profile: 84 y.o. male  with past medical history of CAD s/p CABG, chronic back pain, CHF, CKD, COPD, hip fracture with repair 02/24/17, HTN, GERD, a fib, dementia, depression, anxiety, and T2DM admitted on 03/04/2017 with MRSA infection of left hip and MRSA bacteremia. Also had encephalopathy, AKI, and poor oral intake. I& D performed on left hip 03/05/17 and 03/10/17. 6 weeks of IV abx followed by 3-6 months of oral abx recommended by ID. Echo showed no evidence of vegetation.   Clinical Assessment and Goals of Care: Kathie Rhodes, NP and Mariana Kaufman, NP reviewed medical records, received report from team, assessed the patient, and then met with the patient's daughter, Velva Harman and [name] and his wife to discuss diagnosis, prognosis, GOC, EOL wishes, disposition, and options.  Patient was unable to participate in [place] conversation d/t dementia/delerium.  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 They tell us of a significant decline in patient's function over the past 6 months beginning with an injury to his foot resulting in leg amputation. Family describe his course as "2 steps forward and 5 steps back".  A detailed discussion was had today regarding advance directives. Concepts specific to code status, artifical feeding and hydration, continued IV antibiotics, and rehospitalization were discussed. The difference between an aggressive medical intervention path and a palliative comfort care path were discussed. Values and goals of care important to patient  and family were attempted to be elicited. Concepts of Hospice and Palliative Care were discussed. Natural trajectory and expectations at EOL were discussed.   Family would like to focus on quality of life. Continue antibiotics, but discontinue care that they believe causes suffering such as the feeding tube. They do not want PEG placement. They would like to focus on symptom management - pain and nausea control, bowel regimen. We discussed how appetite may improve with appropriate symptom management. They would like patient to be followed by palliative care outpatient at SNF and transition to hospice if/when he declines. They also requested that he be allowed a natural death and not be resuscitated.   Questions and concerns addressed. Emotional support was provided. Family encouraged to call with questions or concerns.   Primary Decision Maker HCPOA - Roddie Mc    SUMMARY OF RECOMMENDATIONS   - Please write for palliative care to follow patient at SNF in discharge summary -code status changed to DNR - NO peg - remove feeding tube - offer patient foods he enjoys, liberalize diet Symptom Management:   Tylenol 650 mg scheduled q6hr  Oxy IR 43m q3hr for breakthrough  Senokot 2 tab BID  Code Status/Advance Care Planning:  DNR   Palliative Prophylaxis:   Bowel Regimen, Delirium Protocol and Frequent Pain Assessment  Additional Recommendations (Limitations, Scope, Preferences):  Avoid Hospitalization, Initiate Comfort Feeding and No Artificial Feeding  Psycho-social/Spiritual:   Desire for further Chaplaincy support:not addressed  Additional Recommendations: Caregiving  Support/Resources and Education on Hospice  Prognosis:   Unable to determine  [place] care service follow-up      Primary Diagnoses: Present on Admission: .  Sepsis ([place]) . Wound infection after surgery . Acute renal failure  superimposed on stage 3 chronic kidney disease ([place]) . Anemia in chronic kidney disease (CKD) . Atrial fibrillation with normal ventricular rate ([place]) . CAD (coronary artery disease) . Chronic diastolic CHF (congestive heart failure) ([place]) . Leukocytosis . Acute delirium   I have reviewed the medical record, interviewed the patient and family, and examined the patient. The following aspects are pertinent.  Past Medical History:  Diagnosis Date  . BPH (benign prostatic hyperplasia)   . CAD (coronary artery disease)    Multivessel status post CABG 2011 - LIMA to LAD, SVG to diagonal, SVG to OM, SVG to PDA  . Cellulitis    12/15  . Chronic back pain   . Chronic diastolic CHF (congestive heart failure) ([place])   . CKD (chronic kidney disease), stage III ([place])   . Closed hip fracture ([place]) 02/2017  . COPD (chronic obstructive pulmonary disease) ([place])   . Essential hypertension   . Gastric mass    EGD 9/15  . GERD (gastroesophageal reflux disease)   . History of DVT (deep vein thrombosis)    Postphlebitic syndrome  . History of kidney stones   . HOH (hard of hearing)   . Hx of CABG   . Hyperlipidemia   . Persistent atrial fibrillation ([place])    a. s/p DCCV 03/2016.  [name] Sleep apnea    Stop Bang score of 5  . Type 2 diabetes mellitus ([place])   . UTI (urinary tract infection) 11/2016   Social History   Socioeconomic History  . Marital status: Single    Spouse name: None  . Number of children: None  . Years of education: None  . Highest education level: None  Social Needs  . Financial resource strain: None  . Food insecurity - worry: None  . Food insecurity - inability: None  . Transportation needs - medical: None  . Transportation needs - non-medical: None  Occupational History  . Occupation: Retired  Tobacco Use  . Smoking status: Former Smoker    Packs/day: 1.00    Years: 11.00    Pack years: 11.00    Last attempt to quit: 01/05/1956    Years since quitting: 61.2  .  Smokeless tobacco: Never Used  Substance and Sexual Activity  . Alcohol use: No  . Drug use: No  . Sexual activity: No  Other Topics Concern  . None  Social History Narrative   ** Merged History Encounter **       Family History  Problem Relation Age of Onset  . Colon cancer Son 62       deceased   Scheduled Meds: . acetaminophen  650 mg Oral Q6H  . amLODipine  10 mg Oral Daily  . apixaban  5 mg Oral BID  . feeding supplement (ENSURE ENLIVE)  237 mL Oral TID BM  . finasteride  5 mg Oral Daily  . FLUoxetine  20 mg Oral Daily  . hydrALAZINE  10 mg Oral BID  . insulin aspart  0-15 Units Subcutaneous TID WC  . insulin glargine  10 Units Subcutaneous QHS  . pantoprazole  40 mg Oral Daily  . senna-docusate  2 tablet Oral BID  . tamsulosin  0.4 mg Oral Daily   Continuous Infusions: PRN Meds:.acetaminophen **OR** acetaminophen, ipratropium-albuterol, LORazepam, menthol-cetylpyridinium **OR** phenol, metoCLOPramide **OR** metoCLOPramide (REGLAN) injection, nitroGLYCERIN, ondansetron **OR** ondansetron (ZOFRAN) IV, oxyCODONE, sodium chloride flush No Known Allergies Review of Systems  Constitutional: Positive for  activity change and appetite change.  Respiratory: Negative.   Cardiovascular: Negative.   Gastrointestinal: Negative.   Neurological: Positive for weakness.    Physical Exam  Constitutional: He appears well-nourished. No distress.  HENT:  Head: Normocephalic and atraumatic.  Cardiovascular: Normal rate and regular rhythm.  Pulmonary/Chest: Effort normal. No accessory muscle usage. No respiratory distress. He has decreased breath sounds.  Abdominal: Bowel sounds are normal. He exhibits distension. There is no tenderness.  Neurological: He is alert.  Oriented to self and place, disoriented to time and situation. Speech repetitive.   Skin: Skin is warm and dry. He is not diaphoretic.  Psychiatric: Cognition and memory are impaired. He is inattentive.    Vital Signs:  BP (!) 145/75   Pulse 70   Temp 98 F (36.7 C) (Oral)   Resp 16   Ht 5' 7"  (1.702 m)   Wt 73.2 kg (161 lb 6.1 oz)   SpO2 100%   BMI 25.28 kg/m  Pain Assessment: 0-10 POSS *See Group Information*: S-Acceptable,Sleep, easy to arouse Pain Score: 7    SpO2: SpO2: 100 % O2 Device:SpO2: 100 % O2 Flow Rate: .O2 Flow Rate (L/min): 1 L/min  IO: Intake/output summary:   Intake/Output Summary (Last 24 hours) at 03/17/2017 1417 Last data filed at 03/17/2017 0453 Gross per 24 hour  Intake 1427 ml  Output 950 ml  Net 477 ml    LBM: Last BM Date: 03/16/17 Baseline Weight: Weight: 74 kg (163 lb 2.2 oz) Most recent weight: Weight: 73.2 kg (161 lb 6.1 oz)     Palliative Assessment/Data: 40%     Time In: 1300 Time Out: 1430 Time Total: 90 minutes Greater than 50%  of this time was spent counseling and coordinating care related to the above assessment and plan.  Juel Burrow, DNP, AGNP-C Palliative Medicine Team [phone]

## 2017-03-18 ENCOUNTER — Inpatient Hospital Stay (HOSPITAL_COMMUNITY): Payer: Medicare Other

## 2017-03-18 DIAGNOSIS — R109 Unspecified abdominal pain: Secondary | ICD-10-CM

## 2017-03-18 DIAGNOSIS — A419 Sepsis, unspecified organism: Secondary | ICD-10-CM

## 2017-03-18 DIAGNOSIS — Z7189 Other specified counseling: Secondary | ICD-10-CM

## 2017-03-18 LAB — CBC
HEMATOCRIT: 23.9 % — AB (ref 39.0–52.0)
Hemoglobin: 7.4 g/dL — ABNORMAL LOW (ref 13.0–17.0)
MCH: 25.7 pg — ABNORMAL LOW (ref 26.0–34.0)
MCHC: 31 g/dL (ref 30.0–36.0)
MCV: 83 fL (ref 78.0–100.0)
Platelets: 327 10*3/uL (ref 150–400)
RBC: 2.88 MIL/uL — ABNORMAL LOW (ref 4.22–5.81)
RDW: 16.3 % — ABNORMAL HIGH (ref 11.5–15.5)
WBC: 13.1 10*3/uL — AB (ref 4.0–10.5)

## 2017-03-18 LAB — BASIC METABOLIC PANEL
Anion gap: 8 (ref 5–15)
BUN: 33 mg/dL — ABNORMAL HIGH (ref 6–20)
CHLORIDE: 95 mmol/L — AB (ref 101–111)
CO2: 28 mmol/L (ref 22–32)
Calcium: 11.1 mg/dL — ABNORMAL HIGH (ref 8.9–10.3)
Creatinine, Ser: 1.18 mg/dL (ref 0.61–1.24)
GFR calc non Af Amer: 55 mL/min — ABNORMAL LOW (ref >60)
Glucose, Bld: 134 mg/dL — ABNORMAL HIGH (ref 65–99)
POTASSIUM: 4.5 mmol/L (ref 3.5–5.1)
SODIUM: 131 mmol/L — AB (ref 135–145)

## 2017-03-18 LAB — GLUCOSE, CAPILLARY
GLUCOSE-CAPILLARY: 118 mg/dL — AB (ref 65–99)
GLUCOSE-CAPILLARY: 138 mg/dL — AB (ref 65–99)
GLUCOSE-CAPILLARY: 174 mg/dL — AB (ref 65–99)
Glucose-Capillary: 147 mg/dL — ABNORMAL HIGH (ref 65–99)
Glucose-Capillary: 159 mg/dL — ABNORMAL HIGH (ref 65–99)
Glucose-Capillary: 167 mg/dL — ABNORMAL HIGH (ref 65–99)

## 2017-03-18 LAB — PREPARE RBC (CROSSMATCH)

## 2017-03-18 MED ORDER — SODIUM CHLORIDE 0.9 % IV SOLN: Freq: Once | INTRAVENOUS | Status: DC

## 2017-03-18 MED ORDER — OXYCODONE HCL 5 MG PO TABS: 5.0000 mg | ORAL_TABLET | ORAL | Status: DC | PRN

## 2017-03-18 MED ORDER — INSULIN GLARGINE 100 UNIT/ML ~~LOC~~ SOLN: 10.0000 [IU] | Freq: Every day | SUBCUTANEOUS | 11 refills | Status: DC

## 2017-03-18 MED ORDER — MORPHINE SULFATE (PF) 2 MG/ML IV SOLN
2.0000 mg | INTRAVENOUS | Status: AC
  Administered 2017-03-18: 2 mg via INTRAVENOUS
  Filled 2017-03-18: qty 1

## 2017-03-18 MED ORDER — HYDRALAZINE HCL 10 MG PO TABS: 10.0000 mg | ORAL_TABLET | Freq: Two times a day (BID) | ORAL | Status: DC

## 2017-03-18 MED ORDER — LORAZEPAM 1 MG PO TABS: 1.0000 mg | ORAL_TABLET | Freq: Two times a day (BID) | ORAL | 0 refills | Status: DC | PRN

## 2017-03-18 MED ORDER — OXYCODONE HCL 5 MG PO TABS
10.0000 mg | ORAL_TABLET | ORAL | Status: DC | PRN
  Administered 2017-03-19 (×4): 10 mg via ORAL
  Filled 2017-03-18 (×4): qty 2

## 2017-03-18 MED ORDER — LORAZEPAM 1 MG PO TABS
1.0000 mg | ORAL_TABLET | Freq: Two times a day (BID) | ORAL | Status: DC | PRN
  Administered 2017-03-18 – 2017-03-19 (×2): 1 mg via ORAL
  Filled 2017-03-18 (×2): qty 1

## 2017-03-18 MED ORDER — OXYCODONE HCL 5 MG PO TABS: 10.0000 mg | ORAL_TABLET | ORAL | Status: DC | PRN

## 2017-03-18 MED ORDER — OXYCODONE HCL 5 MG PO TABS
5.0000 mg | ORAL_TABLET | Freq: Three times a day (TID) | ORAL | 0 refills | Status: AC | PRN
Start: 2017-03-18 — End: ?

## 2017-03-18 MED ORDER — VANCOMYCIN IV (FOR PTA / DISCHARGE USE ONLY): 500.0000 mg | INTRAVENOUS | 0 refills | Status: AC

## 2017-03-18 NOTE — Progress Notes (Signed)
Daily Progress Note   Patient Name: [name]       Date: 03/18/2017 DOB: 27-Jun-1931  Age: 84 y.o. MRN#: [id number] Attending Physician: Mendel Corning, MD Primary Care Physician: [name], MD Admit Date: 03/04/2017  Reason for Consultation/Follow-up: Establishing goals of care  Subjective: Patient in bed. Requesting pain medication. States pain in L hip all down his leg. Noted tenderness with palpation. Noted PT deferred due to pain. Discussed with nursing- when ordered pain medication is administered, does not appear to relieve pain.   ROS  Length of Stay: 14  Current Medications: Scheduled Meds:  . acetaminophen  650 mg Oral Q6H  . amLODipine  10 mg Oral Daily  . apixaban  5 mg Oral BID  . feeding supplement (ENSURE ENLIVE)  237 mL Oral TID BM  . finasteride  5 mg Oral Daily  . FLUoxetine  20 mg Oral Daily  . hydrALAZINE  10 mg Oral BID  . insulin aspart  0-15 Units Subcutaneous TID WC  . insulin glargine  10 Units Subcutaneous QHS  .  morphine injection  2 mg Intravenous NOW  . pantoprazole  40 mg Oral Daily  . senna-docusate  2 tablet Oral BID  . tamsulosin  0.4 mg Oral Daily    Continuous Infusions: . sodium chloride    . vancomycin Stopped (03/17/17 1955)    PRN Meds: ipratropium-albuterol, LORazepam, menthol-cetylpyridinium **OR** phenol, metoCLOPramide **OR** metoCLOPramide (REGLAN) injection, nitroGLYCERIN, ondansetron **OR** ondansetron (ZOFRAN) IV, oxyCODONE, sodium chloride flush  Physical Exam  Pulmonary/Chest: Effort normal and breath sounds normal.  Neurological:  Alert, pleasantly confused  Psychiatric:  Sad affect  Nursing note and vitals reviewed.           Vital Signs: BP (!) 158/62 (BP Location: Left Arm)   Pulse 67   Temp 98.4 F  (36.9 C) (Oral)   Resp 18   Ht 5\' 7"  (1.702 m)   Wt 73.5 kg (162 lb)   SpO2 94%   BMI 25.37 kg/m  SpO2: SpO2: 94 % O2 Device: O2 Device: Room Air O2 Flow Rate: O2 Flow Rate (L/min): 1 L/min  Intake/output summary:   Intake/Output Summary (Last 24 hours) at 03/18/2017 1453 Last data filed at 03/18/2017 1405 Gross per 24 hour  Intake 940 ml  Output 1800 ml  Net -860  ml   LBM: Last BM Date: 03/17/17 Baseline Weight: Weight: 74 kg (163 lb 2.2 oz) Most recent weight: Weight: 73.5 kg (162 lb)       Palliative Assessment/Data: PPS: 20%    Flowsheet Rows     Most Recent Value  Intake Tab  Referral Department  Hospitalist  Unit at Time of Referral  Cardiac/Telemetry Unit  Palliative Care Primary Diagnosis  Sepsis/Infectious Disease  Date Notified  03/16/17  Palliative Care Type  New Palliative care  Reason for referral  Clarify Goals of Care  Date of Admission  03/04/17  Date first seen by Palliative Care  03/17/17  # of days Palliative referral response time  1 Day(s)  # of days IP prior to Palliative referral  12  Clinical Assessment  Palliative Performance Scale Score  40%  Psychosocial & Spiritual Assessment  Palliative Care Outcomes  Patient/Family meeting held?  Yes  Palliative Care Outcomes  Improved pain interventions, Clarified goals of care, Provided psychosocial or spiritual support, Changed CPR status, ACP counseling assistance, Improved non-pain symptom therapy, Counseled regarding hospice, Provided advance care planning, Linked to palliative care logitudinal support  Patient/Family wishes: Interventions discontinued/not started   Mechanical Ventilation, Tube feedings/TPN  Palliative Care follow-up planned  Yes, Facility      Patient Active Problem List   Diagnosis Date Noted  . Decreased appetite   . Palliative care by specialist   . Goals of care, counseling/discussion   . Sepsis ([place]) 03/04/2017  . Wound infection after surgery 03/04/2017  . Acute  delirium 03/04/2017  . Closed left hip fracture ([place]) 02/22/2017  . GERD (gastroesophageal reflux disease) 02/22/2017  . CKD (chronic kidney disease), stage III ([place]) 02/22/2017  . Hyperglycemia 02/22/2017  . Anemia in chronic kidney disease (CKD) 02/22/2017  . Hypercalcemia 02/22/2017  . Moderate dehydration 02/22/2017  . Fall as cause of accidental injury at home as place of occurrence 02/22/2017  . S/P BKA (below knee amputation) unilateral, left ([place]) 02/22/2017  . Bilateral hip pain 02/22/2017  . Pyuria 02/22/2017  . Hyperbilirubinemia 02/22/2017  . UTI (urinary tract infection) 11/13/2016  . Cellulitis and abscess of foot   . COPD (chronic obstructive pulmonary disease) ([place]) 09/09/2016  . CAD (coronary artery disease) 09/09/2016  . Hyperlipidemia 09/09/2016  . Type 2 diabetes mellitus ([place]) 07/24/2016  . Pressure injury of skin 06/11/2016  . Atrial fibrillation with normal ventricular rate ([place]) 06/10/2016  . Esophageal stricture 06/09/2016  . CAD in native artery 06/08/2016  . Acute renal failure superimposed on stage 3 chronic kidney disease ([place]) 06/08/2016  . Chronic diastolic CHF (congestive heart failure) ([place]) 03/19/2016  . Abnormal weight loss   . Thrush, oral 12/18/2013  . Chest pain at rest 12/18/2013  . Leukocytosis 12/18/2013  . Diabetes ([place]) 12/18/2013  . Chronic back pain 12/18/2013  . Dysphagia 12/18/2013  . Odynophagia 12/18/2013  . Gastric mass 09/07/2013  . Personal history of colonic polyps 08/05/2013  . Nausea and vomiting 08/01/2013  . UTI (lower urinary tract infection) 12/09/2012  . Lower extremity edema 06/02/2012  . Essential hypertension 06/02/2012  . History of DVT (deep vein thrombosis) 06/02/2012  . Obesity (BMI 30-39.9): BMI 31.3 06/02/2012    Palliative Care Assessment & Plan   Patient Profile: 84 y.o. male  with past medical history of CAD s/p CABG, chronic back pain, CHF, CKD, COPD, L AKA, L hip fracture with repair 02/24/17, HTN,  GERD, a fib, dementia, depression, anxiety, and T2DM admitted on 03/04/2017 with MRSA infection  of left hip and MRSA bacteremia. Also had encephalopathy, AKI, and poor oral intake. I& D performed on left hip 03/05/17 and 03/10/17. 6 weeks of IV abx followed by 3-6 months of oral abx recommended by ID. Echo showed no evidence of vegetation. Patient noted to be having sinus pauses. NG tube was placed due to patient was not eating. Palliative medicine consulted for [place].    Assessment/Recommendations/Plan   2mg  IV morphine now x 1  Increase oxycodone IR to 10mg  q3 hr prn po  Continue to recommend d/c to facility with outpatient Palliative following for continued GOC with planned transition to Hospice if patient declines  Goals of Care and Additional Recommendations:  Limitations on Scope of Treatment: No Artificial Feeding and No Surgical Procedures  Code Status:  DNR  Prognosis:   < 6 months  Discharge Planning:  [place] for rehab with Palliative care service follow-up  Care plan was discussed with patient's RN.  Thank you for allowing the Palliative Medicine Team to assist in the care of this patient.   Time In: 1425 Time Out: 1500 Total Time 35 mins Prolonged Time Billed no      Greater than 50%  of this time was spent counseling and coordinating care related to the above assessment and plan.  Mariana Kaufman, AGNP-C Palliative Medicine   Please contact Palliative Medicine Team phone at [phone] for questions and concerns.

## 2017-03-18 NOTE — Progress Notes (Signed)
PT Cancellation Note  Patient Details Name: [name] MRN: [id number] DOB: 1931/04/03   Cancelled Treatment:    Reason Eval/Treat Not Completed: Other (comment);Patient declined, no reason specified.  Pt is in pain and asking for meds, but nursing reports he has had all his meds.  When PT reported this to pt he declined the visit.   Ramond Dial 03/18/2017, 1:13 PM   Mee Hives, PT MS Acute Rehab Dept. Number: [place] [facility] [place]

## 2017-03-18 NOTE — Clinical Social Work Note (Addendum)
CSW continuing to follow patient's progress. Advised by nurse and MD that patient not medically stable for discharge today as he needs to receive blood. Per MD, patient should be able to discharge to SNF on Friday, 3/15. Larene Beach, admissions director at [facility] contacted and updated. Larene Beach will contact [facility] regarding authorization, as it was initially received on 3/12. CSW will continue to monitor patient's progress and facilitate discharge to [facility] when medically stable.  Azrael Maddix Givens, MSW, LCSW Licensed Clinical Social Worker [place] [phone]

## 2017-03-18 NOTE — Progress Notes (Signed)
PHARMACY CONSULT NOTE FOR:  OUTPATIENT  PARENTERAL ANTIBIOTIC THERAPY (OPAT)  Indication: MRSA bacteremia and PJI Regimen: vancomycin 500mg  IV q24h End date: 04/22/2017  *patient needs to have BMET and repeat vancomycin trough checked on 3/16 or 3/17 at SNF and at least weekly after that.   IV antibiotic discharge orders are pended. To discharging provider:  please sign these orders via discharge navigator,  Select New Orders & click on the button choice - Manage This Unsigned Work.     Thank you for allowing pharmacy to be a part of this patient's care.  Jaymon Dudek D. Amelia Macken, PharmD, BCPS Clinical Pharmacist Clinical Phone for 03/18/2017 until 3:30pm: [phone] If after 3:30pm, please call main pharmacy at [phone] 03/18/2017 11:34 AM

## 2017-03-18 NOTE — Progress Notes (Signed)
[facility]                                                                              Patient Demographics  [name], is a 84 y.o. male, DOB - 1931/02/03, TIR:[id number]  Admit date - 03/04/2017   Admitting Physician Murlean Iba, MD  Outpatient Primary MD for the patient is [name], MD  Outpatient specialists:   LOS - 14  days   Medical records reviewed and are as summarized below:    Chief Complaint  Patient presents with  . Hip Pain       Brief summary   Phillip Burgett Chrismanis a 84 y.o.malefrom home where he was found in his bed with a lot of bloody drainage from around his surgical site where he had a left hip surgery done on February 20 by Dr. Lorin Mercy. Admitted for acute metabolic encephalopathy and sepsis 2/2 to surgical wound infection. Patient admitted with left Hemiarthroplasty infection, underwent I and D left hip and placement of prostalac absorbable vancomycin and gentamycin beads on 3-01by Dr Lorin Mercy. Culture from wound grew MRSA. He was also diagnosed with MRSA Bacteremia. ID has been following patient in consultation. Plan for 6 Weeks of IV antibiotics. Repeat blood culture negative to date. Patient noted to have significant drainage form left hip. Dr Lorin Mercy performed a repeat I&D on 03/10/17. Palliative medicine consulted for goals of care and for long-term plans for feeding, sinus pauses.   Assessment & Plan    Left hip MRSA deep infection -Orthopedics has been following, patient had recent left hip surgery on 02/24/17, subsequently underwent I&D of the left hip and placement of absorbable vancomycin and gentamicin beads on 3/1 and 3/6 -ID following, recommended continue with vancomycin, 6 weeks of IV antibiotics till 04/22/17 -Intraoperative wound cultures showed moderate staph aureus  MRSA bacteremia/sepsis -Continue IV vancomycin -2D echo on 03/09/17 showed EF of 55-60%, no evidence of vegetation -Per ID, needs to 6 weeks of  IV vancomycin followed by 3-6 months of oral therapy (Bactrim or doxycycline and likely long-term suppression after) -PICC line placed  Acute metabolic encephalopathy in the setting of dementia -Waxing and waning mental status with malnutrition and poor oral intake, refuses to eat -Nutrition was consulted and "cortrack tube placed on 3/11.  Tube feeds were initiated. -Palliative medicine was consulted, goals of care held, family declined tube feeds, cortrack discontinued.  Placed on comfort feeds  Sinus pauses with atrial fibrillation -Patient has history of chronic atrial fibrillation, metoprolol was held since 3/9 due to episodes of bradycardia/pause  -Discontinued clonidine, EKG showed atrial fibrillation with right bundle branch block -Palliative consulted for goals of care, per palliative medicine, family wished for no aggressive care or interventions  Acute kidney injury -Resolved, creatinine 1.9 on 2/18 on admission  Hypertension -Continue Norvasc, added low-dose hydralazine, discontinued clonidine  Normocytic anemia -Monitor H&H, currently stable, hemoglobin 7.4 -will transfuse 1 unit  Diabetes mellitus type 2 -CBGs currently stable, continue Lantus, sliding scale insulin  BPH Continue finasteride, tamsulosin  Depression/anxiety Continue Prozac  Abdominal pain -Patient reported vague abdominal pain on the left side, abdominal x-ray obtained, no perforation, free  air or acute pathology.  Code Status: Partial code DVT Prophylaxis: Eliquis Family Communication: No family member at the bedside   Disposition Plan: Possible DC to skilled nursing facility in a.m. if bed available  Time Spent in minutes   25 minutes  Procedures:  Status post I&D  Consultants:   Infectious disease Orthopedics  Antimicrobials:      Medications  Scheduled Meds: . acetaminophen  650 mg Oral Q6H  . amLODipine  10 mg Oral Daily  . apixaban  5 mg Oral BID  . feeding supplement  (ENSURE ENLIVE)  237 mL Oral TID BM  . finasteride  5 mg Oral Daily  . FLUoxetine  20 mg Oral Daily  . hydrALAZINE  10 mg Oral BID  . insulin aspart  0-15 Units Subcutaneous TID WC  . insulin glargine  10 Units Subcutaneous QHS  . pantoprazole  40 mg Oral Daily  . senna-docusate  2 tablet Oral BID  . tamsulosin  0.4 mg Oral Daily   Continuous Infusions: . vancomycin Stopped (03/17/17 1955)   PRN Meds:.ipratropium-albuterol, LORazepam, menthol-cetylpyridinium **OR** phenol, metoCLOPramide **OR** metoCLOPramide (REGLAN) injection, nitroGLYCERIN, ondansetron **OR** ondansetron (ZOFRAN) IV, oxyCODONE, sodium chloride flush   Antibiotics   Anti-infectives (From admission, onward)   Start     Dose/Rate Route Frequency Ordered Stop   03/18/17 0000  vancomycin IVPB     500 mg Intravenous Every 24 hours 03/18/17 1014 04/27/17 2359   03/17/17 1700  vancomycin (VANCOCIN) 500 mg in sodium chloride 0.9 % 100 mL IVPB     500 mg 100 mL/hr over 60 Minutes Intravenous Every 24 hours 03/17/17 1606     03/14/17 1200  vancomycin (VANCOCIN) 500 mg in sodium chloride 0.9 % 100 mL IVPB  Status:  Discontinued     500 mg 100 mL/hr over 60 Minutes Intravenous Every 24 hours 03/13/17 1231 03/17/17 1146   03/10/17 1736  vancomycin (VANCOCIN) powder  Status:  Discontinued       As needed 03/10/17 1736 03/10/17 1812   03/10/17 1735  gentamicin (GARAMYCIN) injection  Status:  Discontinued       As needed 03/10/17 1736 03/10/17 1812   03/10/17 1704  ceFAZolin (ANCEF) 2-4 GM/100ML-% IVPB    Comments:  Lance Coon   : cabinet override      03/10/17 1704 03/11/17 0514   03/10/17 1200  vancomycin (VANCOCIN) IVPB 1000 mg/200 mL premix  Status:  Discontinued     1,000 mg 200 mL/hr over 60 Minutes Intravenous Every 24 hours 03/10/17 0758 03/13/17 1156   03/08/17 1800  vancomycin (VANCOCIN) IVPB 750 mg/150 ml premix  Status:  Discontinued     750 mg 150 mL/hr over 60 Minutes Intravenous Every 12 hours 03/08/17  1141 03/10/17 0659   03/06/17 0600  vancomycin (VANCOCIN) IVPB 1000 mg/200 mL premix  Status:  Discontinued     1,000 mg 200 mL/hr over 60 Minutes Intravenous Every 24 hours 03/05/17 1316 03/08/17 1141   03/05/17 1141  gentamicin (GARAMYCIN) injection  Status:  Discontinued       As needed 03/05/17 1142 03/05/17 1202   03/05/17 1138  vancomycin (VANCOCIN) powder  Status:  Discontinued       As needed 03/05/17 1138 03/05/17 1202   03/05/17 0600  vancomycin (VANCOCIN) IVPB 750 mg/150 ml premix  Status:  Discontinued     750 mg 150 mL/hr over 60 Minutes Intravenous Every 24 hours 03/04/17 1238 03/05/17 1316   03/04/17 1400  piperacillin-tazobactam (ZOSYN) IVPB 3.375 g  Status:  Discontinued     3.375 g 12.5 mL/hr over 240 Minutes Intravenous Every 8 hours 03/04/17 1224 03/05/17 1408   03/04/17 0645  piperacillin-tazobactam (ZOSYN) IVPB 3.375 g     3.375 g 100 mL/hr over 30 Minutes Intravenous  Once 03/04/17 0643 03/04/17 0746   03/04/17 0645  vancomycin (VANCOCIN) IVPB 1000 mg/200 mL premix     1,000 mg 200 mL/hr over 60 Minutes Intravenous  Once 03/04/17 1324 03/04/17 4010        Subjective:   Jaimen Melone was seen and examined today.  Complaining of pain on the left side of the abdomen, leg, no nausea vomiting or diarrhea.  No fevers overnight.  Difficult to obtain review of system from the patient  Objective:   Vitals:   03/17/17 1248 03/17/17 1814 03/17/17 2111 03/18/17 1010  BP: (!) 145/75 (!) 146/82 (!) 154/58 (!) 158/62  Pulse: 70 77 63 67  Resp: 16 18 18 18   Temp:  98.3 F (36.8 C) 98.6 F (37 C) 98.4 F (36.9 C)  TempSrc:  Oral Oral Oral  SpO2: 100% 100% 96% 94%  Weight:   73.5 kg (162 lb)   Height:        Intake/Output Summary (Last 24 hours) at 03/18/2017 1318 Last data filed at 03/18/2017 1011 Gross per 24 hour  Intake 700 ml  Output 1600 ml  Net -900 ml     Wt Readings from Last 3 Encounters:  03/17/17 73.5 kg (162 lb)  02/24/17 74.4 kg (164 lb 0.4  oz)  12/08/16 77.1 kg (170 lb)     Exam   General: Alert and oriented x self,  Eyes:   HEENT:  Atraumatic, normocephalic, normal oropharynx  Cardiovascular: S1 S2 auscultated, Regular rate and rhythm. No pedal edema b/l  Respiratory: Clear to auscultation bilaterally, no wheezing, rales or rhonchi  Gastrointestinal: Soft, nondistended, + bowel sounds, ?  Mild tenderness on the left side  Ext: left BKA  Neuro: no new deficits  Musculoskeletal: No digital cyanosis, clubbing  Skin: No rashes  Psych: Normal affect and demeanor, alert and oriented self   Data Reviewed:  I have personally reviewed following labs and imaging studies  Micro Results No results found for this or any previous visit (from the past 240 hour(s)).  Radiology Reports Dg Chest 1 View  Result Date: 03/04/2017 CLINICAL DATA:  84 year old male with fever. EXAM: CHEST 1 VIEW COMPARISON:  Chest radiograph dated 02/22/2017 FINDINGS: There is mild cardiomegaly. Median sternotomy wires and CABG vascular clips. No focal consolidation, pleural effusion, or pneumothorax. No acute osseous pathology. IMPRESSION: No acute cardiopulmonary process. Cardiomegaly. Electronically Signed   By: Anner Crete M.D.   On: 03/04/2017 06:38   Dg Chest 1 View  Result Date: 02/22/2017 CLINICAL DATA:  Left hip pain status post fall. History of COPD and hypertension. EXAM: CHEST 1 VIEW COMPARISON:  Radiographs 11/13/2016 and 11/17/2016. FINDINGS: 1129 hr. Stable cardiomegaly status post median sternotomy and CABG. There is aortic atherosclerosis. Persistent low lung volumes with vascular congestion. No overt pulmonary edema, confluent airspace opacity or significant pleural effusion. IMPRESSION: Cardiomegaly and vascular congestion. Electronically Signed   By: Richardean Sale M.D.   On: 02/22/2017 12:01   Dg Chest 2 View  Result Date: 03/04/2017 CLINICAL DATA:  84 y/o  M; preop evaluation. EXAM: CHEST  2 VIEW COMPARISON:   03/04/2017 chest radiograph FINDINGS: Stable cardiomegaly given projection and technique. Post median sternotomy with wires in alignment. Aortic atherosclerosis with calcification. Stable mild  biapical pleuroparenchymal scarring. Pulmonary vascular congestion. No consolidation. No pleural effusion or pneumothorax. No acute osseous abnormality is evident. IMPRESSION: Stable cardiomegaly. Mild pulmonary vascular congestion. No consolidation. Aortic atherosclerosis. Electronically Signed   By: Kristine Garbe M.D.   On: 03/04/2017 23:55   Ct Hip Left W Contrast  Result Date: 03/04/2017 CLINICAL DATA:  Pt arrived from home via [facility]. Pt was found in bed this morning with serosanguinous drainage around him draining from recent surgery on his left hip. EXAM: CT OF THE LOWER LEFT EXTREMITY WITH CONTRAST TECHNIQUE: Multidetector CT imaging of the lower left extremity was performed according to the standard protocol following intravenous contrast administration. COMPARISON:  None. CONTRAST:  122mL ISOVUE-300 IOPAMIDOL (ISOVUE-300) INJECTION 61% FINDINGS: Bones/Joint/Cartilage Interval total left hip arthroplasty. Alignment is anatomic. No hardware failure or complication. Beam hardening artifact resulting from the orthopedic hardware partially obscures adjacent soft tissue and osseous structures. No fracture or dislocation. Normal alignment. No joint effusion. Ligaments Ligaments are suboptimally evaluated by CT. Muscles and Tendons Ill-defined area complex fluid along the greater trochanter with multiple locules of air within the fluid which may reflect a postoperative hematoma, but a secondarily infected collection cannot be completely excluded. Small volume of air within left gluteal musculature and along the superficial aspect of the vastus lateralis which may be postsurgical, but infection cannot be excluded given surgery was performed 8 days ago. Soft tissue Severe soft tissue edema in the subcutaneous fat  with skin thickening overlying the left hip which may reflect postsurgical edema versus cellulitis. No soft tissue mass. Fat containing left inguinal hernia. IMPRESSION: 1. Ill-defined area complex fluid along the greater trochanter with multiple locules of air within the fluid which may reflect a postoperative hematoma, but a secondarily infected collection cannot be completely excluded. 2. Small volume of air within left gluteal musculature and along the superficial aspect of the vastus lateralis which may be postsurgical, but infection cannot be excluded given surgery was performed 8 days ago. 3. Interval total left hip arthroplasty. Alignment is anatomic. No hardware failure or complication. Beam hardening artifact resulting from the orthopedic hardware partially obscures adjacent soft tissue and osseous structures. Electronically Signed   By: Kathreen Devoid   On: 03/04/2017 08:06   Dg Chest Port 1 View  Result Date: 03/10/2017 CLINICAL DATA:  Line placement EXAM: PORTABLE CHEST 1 VIEW COMPARISON:  03/06/2017 FINDINGS: Right PICC line is been placed with the tip at the cavoatrial junction. Prior CABG. Cardiomegaly. Minimal left basilar density, likely atelectasis. No confluent opacity on the right. No effusions or acute bony abnormality. IMPRESSION: Right PICC line tip at the cavoatrial junction. Cardiomegaly. Left base atelectasis. Electronically Signed   By: Rolm Baptise M.D.   On: 03/10/2017 10:49   Dg Chest Port 1 View  Result Date: 03/06/2017 CLINICAL DATA:  84 year old male with shortness of breath and left chest pain for 1 hour. EXAM: PORTABLE CHEST 1 VIEW COMPARISON:  03/04/2017 and earlier. FINDINGS: Portable AP semi upright view at 1059 hours. Prior CABG. Stable cardiac size and mediastinal contours. Visualized tracheal air column is within normal limits. Mildly lower lung volumes. Allowing for portable technique the lungs are clear. No acute osseous abnormality identified. IMPRESSION: No acute  cardiopulmonary abnormality. Electronically Signed   By: Genevie Ann M.D.   On: 03/06/2017 12:27   Dg Abd Portable 1v  Result Date: 03/15/2017 CLINICAL DATA:  NGT PLACEMENT EXAM: PORTABLE ABDOMEN - 1 VIEW COMPARISON:  None. FINDINGS: Feeding tube with tip in the first portion  duodenum. No bowel obstruction evident IMPRESSION: Feeding tube with tip in the first portion duodenum. Electronically Signed   By: Suzy Bouchard M.D.   On: 03/15/2017 16:53   Dg Abd Portable 2v  Result Date: 03/18/2017 CLINICAL DATA:  Abdominal pain EXAM: PORTABLE ABDOMEN - 2 VIEW COMPARISON:  03/15/2017 FINDINGS: Normal bowel gas pattern without obstruction or ileus. No free air on right lateral decubitus view. The patient has had recent left hip surgery precluding left lateral decubitus view. No renal calculi.  Left hip arthroplasty IMPRESSION: Negative. Electronically Signed   By: Franchot Gallo M.D.   On: 03/18/2017 09:44   Dg Hip Port Unilat With Pelvis 1v Left  Result Date: 02/26/2017 CLINICAL DATA:  Postop left hip arthroplasty. EXAM: DG HIP (WITH OR WITHOUT PELVIS) 1V PORT LEFT COMPARISON:  02/22/2017 FINDINGS: Sequelae of interval left hip hemiarthroplasty are identified. The femoral prosthesis is approximated with the native acetabulum. Postoperative gas is noted in the adjacent soft tissues, and skin staples are in place. Atherosclerotic vascular calcifications are noted. IMPRESSION: Interval left hip hemiarthroplasty without evidence of acute complication. Electronically Signed   By: Logan Bores M.D.   On: 02/26/2017 15:29   Dg Hip Unilat W Or Wo Pelvis 2-3 Views Left  Result Date: 02/22/2017 CLINICAL DATA:  Left hip pain after fall.  Initial encounter. EXAM: DG HIP (WITH OR WITHOUT PELVIS) 2-3V LEFT COMPARISON:  None. FINDINGS: Acute left femoral neck fracture with mild to moderate displacement. No evidence of pelvic ring fracture or diastasis. Osteopenia and atherosclerosis. IMPRESSION: Displaced left femoral neck  fracture. Electronically Signed   By: Monte Fantasia M.D.   On: 02/22/2017 11:54   [place] Ekg Site Rite  Result Date: 03/10/2017 If Site Rite image not attached, placement could not be confirmed due to current cardiac rhythm.   Lab Data:  CBC: Recent Labs  Lab 03/12/17 0357 03/13/17 0425 03/14/17 0457 03/15/17 0419 03/17/17 0453 03/18/17 0342  WBC 18.6* 18.6* 17.2* 14.7* 17.0* 13.1*  NEUTROABS 15.9* 16.2* 14.7* 12.0*  --   --   HGB 7.9* 7.7* 7.7* 7.5* 7.5* 7.4*  HCT 25.1* 24.4* 25.0* 23.7* 23.6* 23.9*  MCV 83.1 84.1 83.9 83.2 83.1 83.0  PLT 361 330 364 313 328 119   Basic Metabolic Panel: Recent Labs  Lab 03/12/17 0357 03/13/17 0425 03/15/17 1803 03/17/17 0453 03/18/17 0342  NA 134* 134*  --  132* 131*  K 3.6 3.6  --  4.6 4.5  CL 95* 93*  --  94* 95*  CO2 28 29  --  29 28  GLUCOSE 155* 152*  --  276* 134*  BUN 15 17  --  33* 33*  CREATININE 0.95 1.06  --  1.16 1.18  CALCIUM 10.8* 11.0*  --  11.0* 11.1*  MG  --   --  1.9  --   --   PHOS  --   --  3.3  --   --    GFR: Estimated Creatinine Clearance: 44.3 mL/min (by C-G formula based on SCr of 1.18 mg/dL). Liver Function Tests: No results for input(s): AST, ALT, ALKPHOS, BILITOT, PROT, ALBUMIN in the last 168 hours. No results for input(s): LIPASE, AMYLASE in the last 168 hours. No results for input(s): AMMONIA in the last 168 hours. Coagulation Profile: No results for input(s): INR, PROTIME in the last 168 hours. Cardiac Enzymes: No results for input(s): CKTOTAL, CKMB, CKMBINDEX, TROPONINI in the last 168 hours. BNP (last 3 results) No results for input(s): PROBNP in the  last 8760 hours. HbA1C: No results for input(s): HGBA1C in the last 72 hours. CBG: Recent Labs  Lab 03/17/17 2110 03/18/17 0011 03/18/17 0612 03/18/17 0726 03/18/17 1135  GLUCAP 121* 118* 138* 147* 159*   Lipid Profile: No results for input(s): CHOL, HDL, LDLCALC, TRIG, CHOLHDL, LDLDIRECT in the last 72 hours. Thyroid Function  Tests: No results for input(s): TSH, T4TOTAL, FREET4, T3FREE, THYROIDAB in the last 72 hours. Anemia Panel: No results for input(s): VITAMINB12, FOLATE, FERRITIN, TIBC, IRON, RETICCTPCT in the last 72 hours. Urine analysis:    Component Value Date/Time   COLORURINE YELLOW 03/04/2017 0544   APPEARANCEUR HAZY (A) 03/04/2017 0544   APPEARANCEUR Cloudy (A) 11/12/2016 1101   LABSPEC 1.012 03/04/2017 0544   PHURINE 5.0 03/04/2017 0544   GLUCOSEU 50 (A) 03/04/2017 0544   HGBUR SMALL (A) 03/04/2017 0544   BILIRUBINUR NEGATIVE 03/04/2017 0544   BILIRUBINUR Negative 11/12/2016 1101   KETONESUR 5 (A) 03/04/2017 0544   PROTEINUR 100 (A) 03/04/2017 0544   UROBILINOGEN 0.2 12/19/2013 1905   NITRITE NEGATIVE 03/04/2017 0544   LEUKOCYTESUR NEGATIVE 03/04/2017 0544   LEUKOCYTESUR 2+ (A) 11/12/2016 1101     Bates Collington M.D. [facility] 03/18/2017, 1:18 PM  Pager: [phone] Between 7am to 7pm - call Pager - [phone]  After 7pm go to [email or web address] - password [email or web address]  Call night coverage person covering after 7pm

## 2017-03-19 LAB — CBC
HCT: 26 % — ABNORMAL LOW (ref 39.0–52.0)
Hemoglobin: 8.2 g/dL — ABNORMAL LOW (ref 13.0–17.0)
MCH: 26 pg (ref 26.0–34.0)
MCHC: 31.5 g/dL (ref 30.0–36.0)
MCV: 82.5 fL (ref 78.0–100.0)
PLATELETS: 311 10*3/uL (ref 150–400)
RBC: 3.15 MIL/uL — ABNORMAL LOW (ref 4.22–5.81)
RDW: 16.3 % — AB (ref 11.5–15.5)
WBC: 12.8 10*3/uL — ABNORMAL HIGH (ref 4.0–10.5)

## 2017-03-19 LAB — BASIC METABOLIC PANEL
Anion gap: 8 (ref 5–15)
BUN: 30 mg/dL — AB (ref 6–20)
CHLORIDE: 97 mmol/L — AB (ref 101–111)
CO2: 28 mmol/L (ref 22–32)
CREATININE: 1.25 mg/dL — AB (ref 0.61–1.24)
Calcium: 11.2 mg/dL — ABNORMAL HIGH (ref 8.9–10.3)
GFR calc Af Amer: 60 mL/min — ABNORMAL LOW (ref >60)
GFR calc non Af Amer: 51 mL/min — ABNORMAL LOW (ref >60)
GLUCOSE: 193 mg/dL — AB (ref 65–99)
Potassium: 4.3 mmol/L (ref 3.5–5.1)
SODIUM: 133 mmol/L — AB (ref 135–145)

## 2017-03-19 LAB — GLUCOSE, CAPILLARY
GLUCOSE-CAPILLARY: 186 mg/dL — AB (ref 65–99)
Glucose-Capillary: 162 mg/dL — ABNORMAL HIGH (ref 65–99)

## 2017-03-19 NOTE — Plan of Care (Signed)
[name]'s updated (downgraded) based on progress [name], [name] 03/19/2017

## 2017-03-19 NOTE — Clinical Social Work Placement (Signed)
   [facility] Room 304 RN to call report to [phone]   Date:  03/19/2017  Patient Details  Name: [name] MRN: [id number] Date of Birth: [birth year]  Clinical Social Work is seeking post-discharge placement for this patient at the [place] level of care (*CSW will initial, date and re-position this form in  chart as items are completed):  Yes   Patient/family provided with [facility] Work Department's list of facilities offering this level of care within the geographic area requested by the patient (or if unable, by the patient's family).  Yes   Patient/family informed of their freedom to choose among providers that offer the needed level of care, that participate in Medicare, Medicaid or managed care program needed by the patient, have an available bed and are willing to accept the patient.  Yes   Patient/family informed of [facility]'s ownership interest in [facility] and [facility], as well as of the fact that they are under no obligation to receive care at these facilities.  PASRR submitted to EDS on       PASRR number received on       Existing PASRR number confirmed on 03/12/17     FL2 transmitted to all facilities in geographic area requested by pt/family on 03/12/17     FL2 transmitted to all facilities within larger geographic area on       Patient informed that his/her managed care company has contracts with or will negotiate with certain facilities, including the following:        Yes   Patient/family informed of bed offers received.  Patient [facility] bed at [facility]     Physician recommends and patient chooses bed at      Patient to be transferred to [facility] on 03/19/17.  Patient to be transferred to facility by [facility]     Patient family notified on 03/19/17 of transfer.  Name of family member notified:  Roddie Mc, friend, POA     PHYSICIAN Please  prepare priority discharge summary, including medications     Additional Comment:    _______________________________________________ Alexander Mt, LCSWA 03/19/2017, 11:42 AM

## 2017-03-19 NOTE — Plan of Care (Signed)
  Progressing Health Behavior/Discharge Planning: Ability to manage health-related needs will improve 03/19/2017 1206 - Progressing by Harlin Heys, RN Skin Integrity: Risk for impaired skin integrity will decrease 03/19/2017 1206 - Progressing by Harlin Heys, RN

## 2017-03-19 NOTE — Progress Notes (Signed)
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 Left hip hemiarthroplasty done 02/24/2017.  Admitted again after being found at home with left hip infection and confusion. PMHx:  a-fib, DM, CKD 3, BK infection, UTI, osteopenia, COPD, DVT, cellulitis, CAD, CABG    PT Comments    Patient remains limited by pain and anxiety and needs max encouragement to participate in EOB activities.  He was improved with sitting balance today and tolerated longer at EOB.  Will bargaining he was able to sit without pushing back.  Continue to feel he is appropriate for SNF level rehab. PT to follow.    Follow Up Recommendations  SNF     Equipment Recommendations  None recommended by PT    Recommendations for Other Services       Precautions / Restrictions Precautions Precautions: Fall;Posterior Hip Restrictions Weight Bearing Restrictions: Yes LLE Weight Bearing: Weight bearing as tolerated Other Position/Activity Restrictions: ok for transfers    Mobility  Bed Mobility Overal bed mobility: Needs Assistance Bed Mobility: Supine to Sit     Supine to sit: Max assist;+2 for physical assistance Sit to supine: Max assist;+2 for physical assistance   General bed mobility comments: able to assist some, though needs max encouragement, increased time, and still unable to sequence cues for specific movements to help him to EOB  Transfers Overall transfer level: Needs assistance   Transfers: Lateral/Scoot Transfers          Lateral/Scoot Transfers: +2 physical assistance;Total assist General transfer comment: attempted to have pt participate in scooting along EOB toward HOB, but did not assist and used pad to scoot up with +2  A  Ambulation/Gait                 Stairs            Wheelchair Mobility    Modified Rankin (Stroke Patients Only)       Balance Overall balance assessment: Needs assistance Sitting-balance support: Feet supported;Single extremity supported;Bilateral upper extremity supported Sitting balance-Leahy Scale: Poor Sitting balance - Comments: at times sitting with S, but needs at least 1 UE support, some increased assist at times and working on eating breakfast at EOB about 8-10 minutes with constant redirection and bargaining to increase sitting time                                    Cognition Arousal/Alertness: Awake/alert Behavior During Therapy: Anxious Overall Cognitive Status: Impaired/Different from baseline Area of Impairment: Attention;Safety/judgement;Memory;Following commands                   Current Attention Level: Sustained Memory: Decreased short-term memory Following Commands: Follows one step commands with increased time;Follows one step commands inconsistently Safety/Judgement: Decreased awareness of safety;Decreased awareness of deficits   Problem Solving: Slow processing;Decreased initiation General Comments: needs encouragement, redirection and multiple cues due to focus on pain/anxiety with mobility      Exercises      General Comments        Pertinent Vitals/Pain Faces Pain Scale: Hurts whole lot Pain Location: L hip Pain Descriptors / Indicators: Operative site guarding;Grimacing;Sore Pain Intervention(s): Monitored during session;Repositioned;Limited activity within  patient's tolerance    Home Living                      Prior Function            PT Goals (current goals can now be found in the care plan section) Progress towards PT goals: Progressing toward goals(slowly with max encouragement)    Frequency    Min 2X/week      PT Plan Current plan remains appropriate    Co-evaluation               AM-PAC PT "6 Clicks" Daily Activity  Outcome Measure  Difficulty turning over in bed (including adjusting bedclothes, sheets and blankets)?: Unable Difficulty moving from lying on back to sitting on the side of the bed? : Unable Difficulty sitting down on and standing up from a chair with arms (e.g., wheelchair, bedside commode, etc,.)?: Unable Help needed moving to and from a bed to chair (including a wheelchair)?: Total Help needed walking in hospital room?: Total Help needed climbing 3-5 steps with a railing? : Total 6 Click Score: 6    End of Session   Activity Tolerance: Patient limited by fatigue;Other (comment)(fear) Patient left: in bed;with call bell/phone within reach;with bed alarm set   PT Visit Diagnosis: Muscle weakness (generalized) (M62.81);History of falling (Z91.81);Difficulty in walking, not elsewhere classified (R26.2);Pain Pain - Right/Left: Left Pain - part of body: Hip     Time: 0902-0922 PT Time Calculation (min) (ACUTE ONLY): 20 min  Charges:  $Therapeutic Activity: 8-22 mins                    G CodesMagda Kiel, [place] [phone] 03/19/2017    [name] 03/19/2017, 11:21 AM

## 2017-03-19 NOTE — Social Work (Signed)
Clinical Social Worker facilitated patient discharge including contacting patient family and facility to confirm patient discharge plans.  Clinical information faxed to facility and family agreeable with plan.  CSW arranged ambulance transport via [facility] to [facility] room 304.  RN to call [phone] with report prior to discharge.  Clinical Social Worker will sign off for now as social work intervention is no longer needed. Please consult [place] again if new need arises.  Alexander Mt, [place] Social Worker

## 2017-03-19 NOTE — Discharge Summary (Signed)
Physician Discharge Summary   Patient ID: [name] MRN: [id number] DOB/AGE: 09-25-1931 84 y.o.  Admit date: 03/04/2017 Discharge date: 03/19/2017  Primary Care Physician:  [name], MD   Recommendations for Outpatient Follow-up:  1. Follow up with PCP in 1-2 weeks 2. Please obtain BMP/CBC in one week 3. Per ID, needs to 6 weeks of IV vancomycin followed by 3-6 months of oral therapy (Bactrim or doxycycline and likely long-term suppression after) -PICC line placed  Home Health: None,  going to skilled nursing facility Equipment/Devices: None  Discharge Condition: stable  CODE STATUS: DNR   Diet recommendation: Regular diet   Discharge Diagnoses:    [name] MRSA bacteremia/sepsis ([place]) . Left hip MRSA deep infection . Acute renal failure superimposed on stage 3 chronic kidney disease ([place]) . Anemia in chronic kidney disease (CKD) . Atrial fibrillation with normal ventricular rate (HCC) with bradycardia and sinus pauses . CAD (coronary artery disease) . Chronic diastolic CHF (congestive heart failure) ([place]) . Hypertension  . Acute metabolic encephalopathy in the setting of dementia Normocytic anemia Diabetes mellitus type 2 BPH Depression/anxiety   Consults:  Infectious disease Orthopedics    Allergies:  No Known Allergies   DISCHARGE MEDICATIONS: Allergies as of 03/19/2017   No Known Allergies     Medication List    STOP taking these medications   cloNIDine 0.1 MG tablet Commonly known as:  CATAPRES   furosemide 20 MG tablet Commonly known as:  LASIX   furosemide 40 MG tablet Commonly known as:  LASIX   HUMALOG KWIKPEN 100 UNIT/ML KiwkPen Generic drug:  insulin lispro   HYDROcodone-acetaminophen 7.5-325 MG tablet Commonly known as:  NORCO   metoprolol succinate 25 MG 24 hr tablet Commonly known as:  TOPROL-XL   polyethylene glycol packet Commonly known as:  MIRALAX / GLYCOLAX   potassium chloride SA 20 MEQ tablet Commonly known as:   K-DUR,KLOR-CON     TAKE these medications   amLODipine 10 MG tablet Commonly known as:  NORVASC Take 1 tablet (10 mg total) by mouth daily.   bisacodyl 10 MG suppository Commonly known as:  DULCOLAX Place 1 suppository (10 mg total) rectally daily as needed for moderate constipation.   cholecalciferol 400 units Tabs tablet Commonly known as:  VITAMIN D Take 400 Units by mouth daily.   docusate sodium 100 MG capsule Commonly known as:  COLACE Take 1 capsule (100 mg total) by mouth 2 (two) times daily.   ELIQUIS 5 MG Tabs tablet Generic drug:  apixaban Take 5 mg by mouth 2 (two) times daily.   feeding supplement (GLUCERNA SHAKE) Liqd Take 237 mLs by mouth 3 (three) times daily between meals. What changed:  when to take this   FLUoxetine 20 MG capsule Commonly known as:  PROZAC Take 20 mg by mouth at bedtime.   hydrALAZINE 10 MG tablet Commonly known as:  APRESOLINE Take 1 tablet (10 mg total) by mouth 2 (two) times daily.   insulin aspart 100 UNIT/ML injection Commonly known as:  novoLOG Inject 0-9 Units into the skin 3 (three) times daily with meals. CBG < 70: implement hypoglycemia protocol CBG 70 - 120: 0 units CBG 121 - 150: 1 unit CBG 151 - 200: 2 units CBG 201 - 250: 3 units CBG 251 - 300: 5 units CBG 301 - 350: 7 units CBG 351 - 400: 9 units CBG > 400: call MD.   insulin glargine 100 UNIT/ML injection Commonly known as:  LANTUS Inject 0.1 mLs (10 Units  total) into the skin at bedtime. What changed:  how much to take   LORazepam 1 MG tablet Commonly known as:  ATIVAN Take 1 tablet (1 mg total) by mouth 2 (two) times daily as needed for anxiety or sleep. What changed:    when to take this  reasons to take this  Another medication with the same name was removed. Continue taking this medication, and follow the directions you see here.   nitroGLYCERIN 0.4 MG SL tablet Commonly known as:  NITROSTAT Place 1 tablet (0.4 mg total) under the tongue every  5 (five) minutes as needed for chest pain.   oxyCODONE 5 MG immediate release tablet Commonly known as:  Oxy IR/ROXICODONE Take 1 tablet (5 mg total) by mouth 3 (three) times daily as needed for severe pain or breakthrough pain. What changed:    when to take this  reasons to take this   pantoprazole 40 MG tablet Commonly known as:  PROTONIX Take 40 mg by mouth daily.   phenylephrine-shark liver oil-mineral oil-petrolatum 0.25-3-14-71.9 % rectal ointment Commonly known as:  PREPARATION H Place 1 application rectally daily.   PROSCAR 5 MG tablet Generic drug:  finasteride Take 5 mg daily by mouth.   rosuvastatin 10 MG tablet Commonly known as:  CRESTOR Take 10 mg by mouth at bedtime.   senna-docusate 8.6-50 MG tablet Commonly known as:  Senokot-S Take 1 tablet by mouth at bedtime as needed for mild constipation. What changed:  when to take this   tamsulosin 0.4 MG Caps capsule Commonly known as:  FLOMAX Take 0.4 mg by mouth daily after supper.   vancomycin IVPB Inject 500 mg into the vein daily. Indication:  MRSA bacteremia Last Day of Therapy:  04/22/2017 Labs - Sunday/Monday:  CBC/D, BMP, and vancomycin trough. Labs - Thursday:  BMP and vancomycin trough Labs - Every other week:  ESR and CRP            Home Infusion Instuctions  (From admission, onward)        Start     Ordered   03/18/17 0000  Home infusion instructions [facility] May follow [facility] Dosing Protocol; May administer Cathflo as needed to maintain patency of vascular access device.; Flushing of vascular access device: per [facility] Protocol: 0.9% NaCl pre/post medica...    Question Answer Comment  Instructions May follow [facility] Dosing Protocol   Instructions May administer Cathflo as needed to maintain patency of vascular access device.   Instructions Flushing of vascular access device: per [facility] Protocol: 0.9% NaCl pre/post medication administration and prn patency; Heparin 100 u/ml,  5ml for implanted ports and Heparin 10u/ml, 5ml for all other central venous catheters.   Instructions May follow [facility] Anaphylaxis Protocol for First Dose Administration in the home: 0.9% NaCl at 25-50 ml/hr to maintain IV access for protocol meds. Epinephrine 0.3 ml IV/IM PRN and Benadryl 25-50 IV/IM PRN s/s of anaphylaxis.   Instructions [facility] Infusion Coordinator (RN) to assist per patient IV care needs in the home PRN.      03 /14/19 1014       Brief H and P: For complete details please refer to admission H and P, but in brief Precious W Chrismanis a 84 y.o.malefrom home where he was found in his bed with a lot of bloody drainage from around his surgical site where he had a left hip surgery done on February 20 by Dr. Lorin Mercy. Admitted for acute metabolic encephalopathy and sepsis 2/2 to  surgical wound infection. Patient admitted with left Hemiarthroplasty infection, underwent I and D left hip and placement of prostalac absorbable vancomycin and gentamycin beads on 3-01by Dr Lorin Mercy. Culture from wound grew MRSA. He was also diagnosed with MRSA Bacteremia. ID has been following patient in consultation. Plan for 6 Weeks of IV antibiotics. Repeat blood culture negative to date. Patient noted to have significant drainage form left hip. Dr Lorin Mercy performed a repeat I&D on 03/10/17.  Hospital Course:   Left hip MRSA deep infection -Orthopedics was consulted , patient had recent left hip surgery on 02/24/17, subsequently underwent I&D of the left hip and placement of absorbable vancomycin and gentamicin beads on 3/1 and 3/6 -ID w also consulted and recommended continue with vancomycin, 6 weeks of IV antibiotics till 04/22/17, see below  -Intraoperative wound cultures showed moderate staph aureus  MRSA bacteremia/sepsis -Continue IV vancomycin till 04/22/17, PICC placed -2D echo on 03/09/17 showed EF of 55-60%, no evidence of vegetation -Per ID, needs to 6 weeks of IV vancomycin followed by 3-6  months of oral therapy (Bactrim or doxycycline and likely long-term suppression after)  Acute metabolic encephalopathy in the setting of dementia -Waxing and waning mental status with malnutrition and poor oral intake, refusal to eat -Nutrition was consulted and cortrack tube placed on 3/11.  Tube feeds were initiated. -Palliative medicine was consulted, goals of care meeting was held, family declined tube feeds, cortrack temporary feeding tube was discontinued.  Placed on comfort feeds  Sinus pauses with atrial fibrillation -Patient has history of chronic atrial fibrillation, metoprolol was held since 3/9 due to episodes of bradycardia/pause  -Discontinued clonidine, EKG showed atrial fibrillation with right bundle branch block -Palliative consulted for goals of care, per palliative medicine, family wished for no aggressive care or interventions  Acute kidney injury -Resolved, creatinine 1.9 on 2/18 on admission, 1.2 at the time of discharge.  Hypertension -Continue Norvasc, added low-dose hydralazine, discontinued clonidine  Normocytic anemia -Transfused on 3/14 for the hemoglobin of 7.4, hemoglobin improved to 8.2 at baseline on discharge.  Diabetes mellitus type 2 -CBGs currently stable, continue Lantus, sliding scale insulin  BPH Continue finasteride, tamsulosin  Depression/anxiety Continue Prozac    Day of Discharge S: Denies any specific complaints, appears comfortable  BP (!) 155/61 (BP Location: Left Arm)   Pulse 76   Temp 98.1 F (36.7 C) (Oral)   Resp 18   Ht 5' 7"  (1.702 m)   Wt 73.9 kg (162 lb 14.1 oz)   SpO2 99%   BMI 25.51 kg/m   Physical Exam: General: Alert and awake oriented x self HEENT: anicteric sclera, pupils reactive to light and accommodation CVS: S1-S2 clear no murmur rubs or gallops Chest: clear to auscultation bilaterally, no wheezing rales or rhonchi Abdomen: soft nontender, nondistended, normal bowel sounds Extremities: Left  BKA    The results of significant diagnostics from this hospitalization (including imaging, microbiology, ancillary and laboratory) are listed below for reference.      Procedures/Studies:  Dg Chest 1 View  Result Date: 03/04/2017 CLINICAL DATA:  84 year old male with fever. EXAM: CHEST 1 VIEW COMPARISON:  Chest radiograph dated 02/22/2017 FINDINGS: There is mild cardiomegaly. Median sternotomy wires and CABG vascular clips. No focal consolidation, pleural effusion, or pneumothorax. No acute osseous pathology. IMPRESSION: No acute cardiopulmonary process. Cardiomegaly. Electronically Signed   By: Anner Crete M.D.   On: 03/04/2017 06:38   Dg Chest 1 View  Result Date: 02/22/2017 CLINICAL DATA:  Left hip pain status post fall. History  of COPD and hypertension. EXAM: CHEST 1 VIEW COMPARISON:  Radiographs 11/13/2016 and 11/17/2016. FINDINGS: 1129 hr. Stable cardiomegaly status post median sternotomy and CABG. There is aortic atherosclerosis. Persistent low lung volumes with vascular congestion. No overt pulmonary edema, confluent airspace opacity or significant pleural effusion. IMPRESSION: Cardiomegaly and vascular congestion. Electronically Signed   By: Richardean Sale M.D.   On: 02/22/2017 12:01   Dg Chest 2 View  Result Date: 03/04/2017 CLINICAL DATA:  84 y/o  M; preop evaluation. EXAM: CHEST  2 VIEW COMPARISON:  03/04/2017 chest radiograph FINDINGS: Stable cardiomegaly given projection and technique. Post median sternotomy with wires in alignment. Aortic atherosclerosis with calcification. Stable mild biapical pleuroparenchymal scarring. Pulmonary vascular congestion. No consolidation. No pleural effusion or pneumothorax. No acute osseous abnormality is evident. IMPRESSION: Stable cardiomegaly. Mild pulmonary vascular congestion. No consolidation. Aortic atherosclerosis. Electronically Signed   By: Kristine Garbe M.D.   On: 03/04/2017 23:55   Ct Hip Left W Contrast  Result  Date: 03/04/2017 CLINICAL DATA:  Pt arrived from home via [facility]. Pt was found in bed this morning with serosanguinous drainage around him draining from recent surgery on his left hip. EXAM: CT OF THE LOWER LEFT EXTREMITY WITH CONTRAST TECHNIQUE: Multidetector CT imaging of the lower left extremity was performed according to the standard protocol following intravenous contrast administration. COMPARISON:  None. CONTRAST:  178m ISOVUE-300 IOPAMIDOL (ISOVUE-300) INJECTION 61% FINDINGS: Bones/Joint/Cartilage Interval total left hip arthroplasty. Alignment is anatomic. No hardware failure or complication. Beam hardening artifact resulting from the orthopedic hardware partially obscures adjacent soft tissue and osseous structures. No fracture or dislocation. Normal alignment. No joint effusion. Ligaments Ligaments are suboptimally evaluated by CT. Muscles and Tendons Ill-defined area complex fluid along the greater trochanter with multiple locules of air within the fluid which may reflect a postoperative hematoma, but a secondarily infected collection cannot be completely excluded. Small volume of air within left gluteal musculature and along the superficial aspect of the vastus lateralis which may be postsurgical, but infection cannot be excluded given surgery was performed 8 days ago. Soft tissue Severe soft tissue edema in the subcutaneous fat with skin thickening overlying the left hip which may reflect postsurgical edema versus cellulitis. No soft tissue mass. Fat containing left inguinal hernia. IMPRESSION: 1. Ill-defined area complex fluid along the greater trochanter with multiple locules of air within the fluid which may reflect a postoperative hematoma, but a secondarily infected collection cannot be completely excluded. 2. Small volume of air within left gluteal musculature and along the superficial aspect of the vastus lateralis which may be postsurgical, but infection cannot be excluded given surgery was  performed 8 days ago. 3. Interval total left hip arthroplasty. Alignment is anatomic. No hardware failure or complication. Beam hardening artifact resulting from the orthopedic hardware partially obscures adjacent soft tissue and osseous structures. Electronically Signed   By: HKathreen Devoid  On: 03/04/2017 08:06   Dg Chest Port 1 View  Result Date: 03/10/2017 CLINICAL DATA:  Line placement EXAM: PORTABLE CHEST 1 VIEW COMPARISON:  03/06/2017 FINDINGS: Right PICC line is been placed with the tip at the cavoatrial junction. Prior CABG. Cardiomegaly. Minimal left basilar density, likely atelectasis. No confluent opacity on the right. No effusions or acute bony abnormality. IMPRESSION: Right PICC line tip at the cavoatrial junction. Cardiomegaly. Left base atelectasis. Electronically Signed   By: KRolm BaptiseM.D.   On: 03/10/2017 10:49   Dg Chest Port 1 View  Result Date: 03/06/2017 CLINICAL DATA:  84year old male  with shortness of breath and left chest pain for 1 hour. EXAM: PORTABLE CHEST 1 VIEW COMPARISON:  03/04/2017 and earlier. FINDINGS: Portable AP semi upright view at 1059 hours. Prior CABG. Stable cardiac size and mediastinal contours. Visualized tracheal air column is within normal limits. Mildly lower lung volumes. Allowing for portable technique the lungs are clear. No acute osseous abnormality identified. IMPRESSION: No acute cardiopulmonary abnormality. Electronically Signed   By: Genevie Ann M.D.   On: 03/06/2017 12:27   Dg Abd Portable 1v  Result Date: 03/15/2017 CLINICAL DATA:  NGT PLACEMENT EXAM: PORTABLE ABDOMEN - 1 VIEW COMPARISON:  None. FINDINGS: Feeding tube with tip in the first portion duodenum. No bowel obstruction evident IMPRESSION: Feeding tube with tip in the first portion duodenum. Electronically Signed   By: Suzy Bouchard M.D.   On: 03/15/2017 16:53   Dg Abd Portable 2v  Result Date: 03/18/2017 CLINICAL DATA:  Abdominal pain EXAM: PORTABLE ABDOMEN - 2 VIEW COMPARISON:   03/15/2017 FINDINGS: Normal bowel gas pattern without obstruction or ileus. No free air on right lateral decubitus view. The patient has had recent left hip surgery precluding left lateral decubitus view. No renal calculi.  Left hip arthroplasty IMPRESSION: Negative. Electronically Signed   By: Franchot Gallo M.D.   On: 03/18/2017 09:44   Dg Hip Port Unilat With Pelvis 1v Left  Result Date: 02/26/2017 CLINICAL DATA:  Postop left hip arthroplasty. EXAM: DG HIP (WITH OR WITHOUT PELVIS) 1V PORT LEFT COMPARISON:  02/22/2017 FINDINGS: Sequelae of interval left hip hemiarthroplasty are identified. The femoral prosthesis is approximated with the native acetabulum. Postoperative gas is noted in the adjacent soft tissues, and skin staples are in place. Atherosclerotic vascular calcifications are noted. IMPRESSION: Interval left hip hemiarthroplasty without evidence of acute complication. Electronically Signed   By: Logan Bores M.D.   On: 02/26/2017 15:29   Dg Hip Unilat W Or Wo Pelvis 2-3 Views Left  Result Date: 02/22/2017 CLINICAL DATA:  Left hip pain after fall.  Initial encounter. EXAM: DG HIP (WITH OR WITHOUT PELVIS) 2-3V LEFT COMPARISON:  None. FINDINGS: Acute left femoral neck fracture with mild to moderate displacement. No evidence of pelvic ring fracture or diastasis. Osteopenia and atherosclerosis. IMPRESSION: Displaced left femoral neck fracture. Electronically Signed   By: Monte Fantasia M.D.   On: 02/22/2017 11:54   [place] Ekg Site Rite  Result Date: 03/10/2017 If Site Rite image not attached, placement could not be confirmed due to current cardiac rhythm.      LAB RESULTS: Basic Metabolic Panel: Recent Labs  Lab 03/15/17 1803  03/18/17 0342 03/19/17 0640  NA  --    < > 131* 133*  K  --    < > 4.5 4.3  CL  --    < > 95* 97*  CO2  --    < > 28 28  GLUCOSE  --    < > 134* 193*  BUN  --    < > 33* 30*  CREATININE  --    < > 1.18 1.25*  CALCIUM  --    < > 11.1* 11.2*  MG 1.9  --   --    --   PHOS 3.3  --   --   --    < > = values in this interval not displayed.   Liver Function Tests: No results for input(s): AST, ALT, ALKPHOS, BILITOT, PROT, ALBUMIN in the last 168 hours. No results for input(s): LIPASE, AMYLASE in the last 168  hours. No results for input(s): AMMONIA in the last 168 hours. CBC: Recent Labs  Lab 03/15/17 0419  03/18/17 0342 03/19/17 0640  WBC 14.7*   < > 13.1* 12.8*  NEUTROABS 12.0*  --   --   --   HGB 7.5*   < > 7.4* 8.2*  HCT 23.7*   < > 23.9* 26.0*  MCV 83.2   < > 83.0 82.5  PLT 313   < > 327 311   < > = values in this interval not displayed.   Cardiac Enzymes: No results for input(s): CKTOTAL, CKMB, CKMBINDEX, TROPONINI in the last 168 hours. BNP: Invalid input(s): POCBNP CBG: Recent Labs  Lab 03/18/17 2054 03/19/17 0801  GLUCAP 167* 162*      Disposition and Follow-up: Discharge Instructions    Diet Carb Modified   Complete by:  As directed    Home infusion instructions [facility] May follow [place] Dosing Protocol; May administer Cathflo as needed to maintain patency of vascular access device.; Flushing of vascular access device: per [facility] Protocol: 0.9% NaCl pre/post medica...   Complete by:  As directed    Instructions:  May follow [place] Dosing Protocol   Instructions:  May administer Cathflo as needed to maintain patency of vascular access device.   Instructions:  Flushing of vascular access device: per [facility] Protocol: 0.9% NaCl pre/post medication administration and prn patency; Heparin 100 u/ml, 16m for implanted ports and Heparin 10u/ml, 549mfor all other central venous catheters.   Instructions:  May follow [facility] Anaphylaxis Protocol for First Dose Administration in the home: 0.9% NaCl at 25-50 ml/hr to maintain IV access for protocol meds. Epinephrine 0.3 ml IV/IM PRN and Benadryl 25-50 IV/IM PRN s/s of anaphylaxis.   Instructions:  [facility] Coord[facility] (RN) to assist per patient IV care  needs in the home PRN.   Increase activity slowly   Complete by:  As directed    Increase activity slowly   Complete by:  As directed        [place]: [place]  YaMarybelle KillingsMD In 2 weeks.   Specialty:  Orthopedic Surgery Why:  Appointment date on 03/26/2017 at 1:30p  Contact information: [phone]        DoLucia GaskinsMD. Schedule an appointment as soon as possible for a visit in 2 weeks.   Specialty:  Internal Medicine Why:  Office will contact you at phone number [phone] schedule a follow up appointment. Thank you.  Contact information: [address] [phone]            Time coordinating discharge:  45 minutes  Signed:   RiEstill Cotta.D. [facility] 03/19/2017, 12:22 PM Pager: [phone]

## 2017-03-19 NOTE — Progress Notes (Signed)
Report called to Carol, nurse at [facility]. 

## 2017-03-20 LAB — BPAM RBC
BLOOD PRODUCT EXPIRATION DATE: 201903212359
ISSUE DATE / TIME: 201903150154
Unit Type and Rh: 9500

## 2017-03-20 LAB — TYPE AND SCREEN
ABO/RH(D): O NEG
Antibody Screen: NEGATIVE
Unit division: 0

## 2017-03-26 ENCOUNTER — Encounter (INDEPENDENT_AMBULATORY_CARE_PROVIDER_SITE_OTHER): Payer: Self-pay | Admitting: Orthopaedic Surgery

## 2017-03-26 ENCOUNTER — Ambulatory Visit (INDEPENDENT_AMBULATORY_CARE_PROVIDER_SITE_OTHER): Payer: Medicare Other | Admitting: Orthopaedic Surgery

## 2017-03-26 VITALS — BP 139/73 | HR 78 | Temp 96.9°F

## 2017-03-26 DIAGNOSIS — S72002S Fracture of unspecified part of neck of left femur, sequela: Secondary | ICD-10-CM

## 2017-03-26 DIAGNOSIS — T8149XA Infection following a procedure, other surgical site, initial encounter: Secondary | ICD-10-CM

## 2017-03-26 NOTE — Progress Notes (Signed)
Post-Op Visit Note   Patient: [name]           Date of Birth: [birth year]           MRN: [id number] Visit Date: 03/26/2017 PCP: [name], MD   Assessment & Plan: Follow-up infected hip hemiarthroplasty with MRSA.  He is on IV vancomycin.  After that he will go on p.o. suppression per infectious disease consultation.  Palliative care is seeing him.  I have encouraged the patient he needs to eat better.  Recheck 2 weeks for probable suture removal.  We could do this at the time of follow-up x-rays while he is on the x-ray table.  Chief Complaint:  Chief Complaint  Patient presents with  . Left Hip - Routine Post Op   Visit Diagnoses:  1. Closed fracture of left hip, sequela   2. Wound infection after surgery     Plan: Return in 2 weeks for recheck.  X-rays at that time and wound check for possible suture removal.  Follow-Up Instructions: No follow-ups on file.   Orders:  No orders of the defined types were placed in this encounter.  No orders of the defined types were placed in this encounter.   Imaging: No results found.  PMFS History: Patient Active Problem List   Diagnosis Date Noted  . Advance care planning   . Decreased appetite   . Palliative care by specialist   . Goals of care, counseling/discussion   . Sepsis ([place]) 03/04/2017  . Wound infection after surgery 03/04/2017  . Acute delirium 03/04/2017  . Closed left hip fracture ([place]) 02/22/2017  . GERD (gastroesophageal reflux disease) 02/22/2017  . CKD (chronic kidney disease), stage III ([place]) 02/22/2017  . Hyperglycemia 02/22/2017  . Anemia in chronic kidney disease (CKD) 02/22/2017  . Hypercalcemia 02/22/2017  . Moderate dehydration 02/22/2017  . Fall as cause of accidental injury at home as place of occurrence 02/22/2017  . S/P BKA (below knee amputation) unilateral, left ([place]) 02/22/2017  . Bilateral hip pain 02/22/2017  . Pyuria 02/22/2017  . Hyperbilirubinemia 02/22/2017  . UTI  (urinary tract infection) 11/13/2016  . Cellulitis and abscess of foot   . COPD (chronic obstructive pulmonary disease) ([place]) 09/09/2016  . CAD (coronary artery disease) 09/09/2016  . Hyperlipidemia 09/09/2016  . Type 2 diabetes mellitus ([place]) 07/24/2016  . Pressure injury of skin 06/11/2016  . Atrial fibrillation with normal ventricular rate ([place]) 06/10/2016  . Esophageal stricture 06/09/2016  . CAD in native artery 06/08/2016  . Acute renal failure superimposed on stage 3 chronic kidney disease ([place]) 06/08/2016  . Chronic diastolic CHF (congestive heart failure) ([place]) 03/19/2016  . Abnormal weight loss   . Thrush, oral 12/18/2013  . Chest pain at rest 12/18/2013  . Leukocytosis 12/18/2013  . Diabetes ([place]) 12/18/2013  . Chronic back pain 12/18/2013  . Dysphagia 12/18/2013  . Odynophagia 12/18/2013  . Gastric mass 09/07/2013  . Personal history of colonic polyps 08/05/2013  . Nausea and vomiting 08/01/2013  . UTI (lower urinary tract infection) 12/09/2012  . Lower extremity edema 06/02/2012  . Essential hypertension 06/02/2012  . History of DVT (deep vein thrombosis) 06/02/2012  . Obesity (BMI 30-39.9): BMI 31.3 06/02/2012   Past Medical History:  Diagnosis Date  . BPH (benign prostatic hyperplasia)   . CAD (coronary artery disease)    Multivessel status post CABG 2011 - LIMA to LAD, SVG to diagonal, SVG to OM, SVG to PDA  . Cellulitis    12/15  .  Chronic back pain   . Chronic diastolic CHF (congestive heart failure) ([place])   . CKD (chronic kidney disease), stage III ([place])   . Closed hip fracture ([place]) 02/2017  . COPD (chronic obstructive pulmonary disease) ([place])   . Essential hypertension   . Gastric mass    EGD 9/15  . GERD (gastroesophageal reflux disease)   . History of DVT (deep vein thrombosis)    Postphlebitic syndrome  . History of kidney stones   . HOH (hard of hearing)   . Hx of CABG   . Hyperlipidemia   . Persistent atrial fibrillation ([place])    a. s/p  DCCV 03/2016.  [name] Sleep apnea    Stop Bang score of 5  . Type 2 diabetes mellitus ([place])   . UTI (urinary tract infection) 11/2016    Family History  Problem Relation Age of Onset  . Colon cancer Son 43       deceased    Past Surgical History:  Procedure Laterality Date  . AMPUTATION Left 10/07/2016   Procedure: AMPUTATION BELOW KNEE;  Surgeon: [name], MD;  Location: [facility];  Service: Orthopedics;  Laterality: Left;  . CARDIOVERSION N/A 03/20/2016   Procedure: CARDIOVERSION;  Surgeon: Arnoldo Lenis, MD;  [facility];  Service: Endoscopy;  Laterality: N/A;  . CATARACT EXTRACTION W/PHACO Left 02/07/2016   Procedure: CATARACT EXTRACTION PHACO AND INTRAOCULAR LENS PLACEMENT (IOC);  Surgeon: [name], MD;  [facility];  Service: Ophthalmology;  Laterality: Left;  CDE:  23.13  . COLONOSCOPY  2004   Dr. Laural Golden: three small polyps at cecum, path unknown, external hemorrhoids  . COLONOSCOPY N/A 08/16/2013   Dr. [name]: incomplete prep. multiple tubular adenomas, multiple biopsies. Needs surveillance in Aug 2016 due to poor prep  . CORONARY ARTERY BYPASS GRAFT     x5  . CORONARY ARTERY BYPASS GRAFT  2011  . CYSTOSCOPY N/A 12/12/2012   Procedure: CYSTOSCOPY FLEXIBLE;  Surgeon: Marissa Nestle, MD;  [facility];  Service: Urology;  Laterality: N/A;  . ESOPHAGEAL DILATION N/A 09/14/2016   Procedure: ESOPHAGEAL DILATION;  Surgeon: Daneil Dolin, MD;  [facility];  Service: Endoscopy;  Laterality: N/A;  . ESOPHAGOGASTRODUODENOSCOPY N/A 08/16/2013   Dr. [name]: normal esophagus s/p Maloney dilation, gastric erosions, submucosal gastric mass vs extrinsic mass  . ESOPHAGOGASTRODUODENOSCOPY (EGD) WITH PROPOFOL N/A 09/14/2016   Procedure: ESOPHAGOGASTRODUODENOSCOPY (EGD) WITH PROPOFOL;  Surgeon: Daneil Dolin, MD;  [facility];  Service: Endoscopy;  Laterality: N/A;  . EUS N/A 09/07/2013   Dr. Ardis Hughs: likely benign gastric lipoma, needs CT in Sept  2016  . HIP ARTHROPLASTY Left 02/24/2017   Procedure: ARTHROPLASTY POSTERIOR (HEMIARTHROPLASTY);  Surgeon: [name], MD;  Location: [facility];  Service: Orthopedics;  Laterality: Left;  . INCISION AND DRAINAGE ABSCESS N/A 12/20/2013   Procedure: INCISION AND DRAINAGE ABSCESS NECK;  Surgeon: Jamesetta So, MD;  [facility];  Service: General;  Laterality: N/A;  . INCISION AND DRAINAGE ABSCESS Left 09/04/2016   Procedure: INCISION AND DRAINAGE ABSCESS;  Surgeon: Aviva Signs, MD;  [facility];  Service: General;  Laterality: Left;  . INCISION AND DRAINAGE HIP Left 03/05/2017   Procedure: LEFT HIP IRRIGATION AND DEBRIDEMENT AND PLACEMENT OF ANTIBIOTIC BEADS;  Surgeon: [name], MD;  Location: [facility];  Service: Orthopedics;  Laterality: Left;  . INCISION AND DRAINAGE HIP Left 03/10/2017   Procedure: REPEAT IRRIGATION AND DEBRIDEMENT LEFT DEEP HIP INFECTION, ANTIBIOTIC BEAD  PLACEMENT, VAC PLACEMENT;  Surgeon: [name], MD;  Location: [facility];  Service: Orthopedics;  Laterality: Left;  [name] MALONEY DILATION N/A 08/16/2013   Procedure: Venia Minks DILATION;  Surgeon: Daneil Dolin, MD;  [facility];  Service: Endoscopy;  Laterality: N/A;   Social History   Occupational History  . Occupation: Retired  Tobacco Use  . Smoking status: Former Smoker    Packs/day: 1.00    Years: 11.00    Pack years: 11.00    Last attempt to quit: 01/05/1956    Years since quitting: 61.2  . Smokeless tobacco: Never Used  Substance and Sexual Activity  . Alcohol use: No  . Drug use: No  . Sexual activity: Never

## 2017-04-07 ENCOUNTER — Encounter (INDEPENDENT_AMBULATORY_CARE_PROVIDER_SITE_OTHER): Payer: Self-pay | Admitting: Surgery

## 2017-04-07 ENCOUNTER — Ambulatory Visit (INDEPENDENT_AMBULATORY_CARE_PROVIDER_SITE_OTHER): Payer: Medicare Other

## 2017-04-07 ENCOUNTER — Ambulatory Visit (INDEPENDENT_AMBULATORY_CARE_PROVIDER_SITE_OTHER): Payer: Medicare Other | Admitting: Surgery

## 2017-04-07 DIAGNOSIS — M25552 Pain in left hip: Secondary | ICD-10-CM

## 2017-04-07 NOTE — Progress Notes (Signed)
Patient returns for wound check.  He is doing rehab in [place].  Still on antibiotics.   Exam Today left hip sutures were removed and Steri-Strips applied.  Incision healing well.  No drainage.  No gross signs of infection.    Plan Patient continue antibiotic per infectious disease.  Will follow up with Dr. Lorin Mercy in 6 weeks for recheck.

## 2017-05-19 ENCOUNTER — Ambulatory Visit (INDEPENDENT_AMBULATORY_CARE_PROVIDER_SITE_OTHER): Payer: Medicare Other | Admitting: Orthopaedic Surgery

## 2017-05-19 ENCOUNTER — Encounter (INDEPENDENT_AMBULATORY_CARE_PROVIDER_SITE_OTHER): Payer: Self-pay | Admitting: Orthopaedic Surgery

## 2017-05-19 VITALS — BP 146/77 | HR 60

## 2017-05-19 DIAGNOSIS — Z96649 Presence of unspecified artificial hip joint: Secondary | ICD-10-CM

## 2017-05-19 NOTE — Progress Notes (Signed)
Post-Op Visit Note   Patient: [name]           Date of Birth: July 13, 1931           MRN: [id number] Visit Date: 05/19/2017 PCP: [name], MD   Assessment & Plan:  Chief Complaint:  Chief Complaint  Patient presents with  . Left Hip - Pain, Routine Post Op   Visit Diagnoses:  1. Status post hip hemiarthroplasty          With postop left hip MRSA infection.  Treated with IV antibiotics repeat surgery antibiotic bead placement.  Hip incision is healed.  He has had previous left BKA.  At small ulcer on the lateral aspect of his right heel which is almost completely healed.  He needs to be on p.o. antibiotics per ID team and the recommendation was Septra DS 1 p.o. twice daily x6 months.  Plan: Septra DS 1 p.o. twice daily x6 months.  He can follow-up with me as needed.  He likely can get in a wheelchair and propel it with his arms  Follow-Up Instructions: No follow-ups on file.   Orders:  No orders of the defined types were placed in this encounter.  No orders of the defined types were placed in this encounter.   Imaging: No results found.  PMFS History: Patient Active Problem List   Diagnosis Date Noted  . Advance care planning   . Decreased appetite   . Palliative care by specialist   . Goals of care, counseling/discussion   . Sepsis ([place]) 03/04/2017  . Wound infection after surgery 03/04/2017  . Acute delirium 03/04/2017  . Closed left hip fracture, initial encounter ([place]) 02/22/2017  . GERD (gastroesophageal reflux disease) 02/22/2017  . CKD (chronic kidney disease), stage III ([place]) 02/22/2017  . Hyperglycemia 02/22/2017  . Anemia in chronic kidney disease (CKD) 02/22/2017  . Hypercalcemia 02/22/2017  . Moderate dehydration 02/22/2017  . Fall as cause of accidental injury at home as place of occurrence 02/22/2017  . S/P BKA (below knee amputation) unilateral, left ([place]) 02/22/2017  . Bilateral hip pain 02/22/2017  . Pyuria 02/22/2017  .  Hyperbilirubinemia 02/22/2017  . UTI (urinary tract infection) 11/13/2016  . Cellulitis and abscess of foot   . COPD (chronic obstructive pulmonary disease) ([place]) 09/09/2016  . CAD (coronary artery disease) 09/09/2016  . Hyperlipidemia 09/09/2016  . Type 2 diabetes mellitus ([place]) 07/24/2016  . Pressure injury of skin 06/11/2016  . Atrial fibrillation with normal ventricular rate ([place]) 06/10/2016  . Esophageal stricture 06/09/2016  . CAD in native artery 06/08/2016  . Acute renal failure superimposed on stage 3 chronic kidney disease ([place]) 06/08/2016  . Chronic diastolic CHF (congestive heart failure) ([place]) 03/19/2016  . Abnormal weight loss   . Thrush, oral 12/18/2013  . Chest pain at rest 12/18/2013  . Leukocytosis 12/18/2013  . Diabetes ([place]) 12/18/2013  . Chronic back pain 12/18/2013  . Dysphagia 12/18/2013  . Odynophagia 12/18/2013  . Gastric mass 09/07/2013  . Personal history of colonic polyps 08/05/2013  . Nausea and vomiting 08/01/2013  . UTI (lower urinary tract infection) 12/09/2012  . Lower extremity edema 06/02/2012  . Essential hypertension 06/02/2012  . History of DVT (deep vein thrombosis) 06/02/2012  . Obesity (BMI 30-39.9): BMI 31.3 06/02/2012   Past Medical History:  Diagnosis Date  . BPH (benign prostatic hyperplasia)   . CAD (coronary artery disease)    Multivessel status post CABG 2011 - LIMA to LAD, SVG to diagonal, SVG to  OM, SVG to PDA  . Cellulitis    12/15  . Chronic back pain   . Chronic diastolic CHF (congestive heart failure) ([place])   . CKD (chronic kidney disease), stage III ([place])   . Closed hip fracture ([place]) 02/2017  . COPD (chronic obstructive pulmonary disease) ([place])   . Essential hypertension   . Gastric mass    EGD 9/15  . GERD (gastroesophageal reflux disease)   . History of DVT (deep vein thrombosis)    Postphlebitic syndrome  . History of kidney stones   . HOH (hard of hearing)   . Hx of CABG   . Hyperlipidemia   . Persistent  atrial fibrillation ([place])    a. s/p DCCV 03/2016.  [name] Sleep apnea    Stop Bang score of 5  . Type 2 diabetes mellitus ([place])   . UTI (urinary tract infection) 11/2016    Family History  Problem Relation Age of Onset  . Colon cancer Son 34       deceased    Past Surgical History:  Procedure Laterality Date  . AMPUTATION Left 10/07/2016   Procedure: AMPUTATION BELOW KNEE;  Surgeon: [name], MD;  Location: [facility];  Service: Orthopedics;  Laterality: Left;  . CARDIOVERSION N/A 03/20/2016   Procedure: CARDIOVERSION;  Surgeon: Arnoldo Lenis, MD;  [facility];  Service: Endoscopy;  Laterality: N/A;  . CATARACT EXTRACTION W/PHACO Left 02/07/2016   Procedure: CATARACT EXTRACTION PHACO AND INTRAOCULAR LENS PLACEMENT (IOC);  Surgeon: [name], MD;  [facility];  Service: Ophthalmology;  Laterality: Left;  CDE:  23.13  . COLONOSCOPY  2004   Dr. Laural Golden: three small polyps at cecum, path unknown, external hemorrhoids  . COLONOSCOPY N/A 08/16/2013   Dr. [name]: incomplete prep. multiple tubular adenomas, multiple biopsies. Needs surveillance in Aug 2016 due to poor prep  . CORONARY ARTERY BYPASS GRAFT     x5  . CORONARY ARTERY BYPASS GRAFT  2011  . CYSTOSCOPY N/A 12/12/2012   Procedure: CYSTOSCOPY FLEXIBLE;  Surgeon: Marissa Nestle, MD;  [facility];  Service: Urology;  Laterality: N/A;  . ESOPHAGEAL DILATION N/A 09/14/2016   Procedure: ESOPHAGEAL DILATION;  Surgeon: Daneil Dolin, MD;  [facility];  Service: Endoscopy;  Laterality: N/A;  . ESOPHAGOGASTRODUODENOSCOPY N/A 08/16/2013   Dr. [name]: normal esophagus s/p Maloney dilation, gastric erosions, submucosal gastric mass vs extrinsic mass  . ESOPHAGOGASTRODUODENOSCOPY (EGD) WITH PROPOFOL N/A 09/14/2016   Procedure: ESOPHAGOGASTRODUODENOSCOPY (EGD) WITH PROPOFOL;  Surgeon: Daneil Dolin, MD;  [facility];  Service: Endoscopy;  Laterality: N/A;  . EUS N/A 09/07/2013   Dr. Ardis Hughs: likely benign  gastric lipoma, needs CT in Sept 2016  . HIP ARTHROPLASTY Left 02/24/2017   Procedure: ARTHROPLASTY POSTERIOR (HEMIARTHROPLASTY);  Surgeon: [name], MD;  Location: [facility];  Service: Orthopedics;  Laterality: Left;  . INCISION AND DRAINAGE ABSCESS N/A 12/20/2013   Procedure: INCISION AND DRAINAGE ABSCESS NECK;  Surgeon: Jamesetta So, MD;  [facility];  Service: General;  Laterality: N/A;  . INCISION AND DRAINAGE ABSCESS Left 09/04/2016   Procedure: INCISION AND DRAINAGE ABSCESS;  Surgeon: Aviva Signs, MD;  [facility];  Service: General;  Laterality: Left;  . INCISION AND DRAINAGE HIP Left 03/05/2017   Procedure: LEFT HIP IRRIGATION AND DEBRIDEMENT AND PLACEMENT OF ANTIBIOTIC BEADS;  Surgeon: [name], MD;  Location: [facility];  Service: Orthopedics;  Laterality: Left;  . INCISION AND DRAINAGE HIP Left 03/10/2017  Procedure: REPEAT IRRIGATION AND DEBRIDEMENT LEFT DEEP HIP INFECTION, ANTIBIOTIC BEAD PLACEMENT, [place];  Surgeon: [name], MD;  Location: [facility];  Service: Orthopedics;  Laterality: Left;  [name] MALONEY DILATION N/A 08/16/2013   Procedure: Venia Minks DILATION;  Surgeon: Daneil Dolin, MD;  [facility];  Service: Endoscopy;  Laterality: N/A;   Social History   Occupational History  . Occupation: Retired  Tobacco Use  . Smoking status: Former Smoker    Packs/day: 1.00    Years: 11.00    Pack years: 11.00    Last attempt to quit: 01/05/1956    Years since quitting: 61.4  . Smokeless tobacco: Never Used  Substance and Sexual Activity  . Alcohol use: No  . Drug use: No  . Sexual activity: Never

## 2017-06-24 ENCOUNTER — Ambulatory Visit (INDEPENDENT_AMBULATORY_CARE_PROVIDER_SITE_OTHER): Payer: Medicare Other | Admitting: Internal Medicine

## 2017-06-24 ENCOUNTER — Encounter: Payer: Self-pay | Admitting: Internal Medicine

## 2017-06-24 DIAGNOSIS — N183 Chronic kidney disease, stage 3 unspecified: Secondary | ICD-10-CM

## 2017-06-24 DIAGNOSIS — M009 Pyogenic arthritis, unspecified: Secondary | ICD-10-CM | POA: Insufficient documentation

## 2017-06-24 DIAGNOSIS — Z5181 Encounter for therapeutic drug level monitoring: Secondary | ICD-10-CM

## 2017-06-24 DIAGNOSIS — M00051 Staphylococcal arthritis, right hip: Secondary | ICD-10-CM | POA: Diagnosis not present

## 2017-06-24 LAB — COMPLETE METABOLIC PANEL WITH GFR
AG Ratio: 1 (calc) (ref 1.0–2.5)
ALKALINE PHOSPHATASE (APISO): 206 U/L — AB (ref 40–115)
ALT: 20 U/L (ref 9–46)
AST: 14 U/L (ref 10–35)
Albumin: 3.4 g/dL — ABNORMAL LOW (ref 3.6–5.1)
BILIRUBIN TOTAL: 0.3 mg/dL (ref 0.2–1.2)
BUN / CREAT RATIO: 19 (calc) (ref 6–22)
BUN: 52 mg/dL — AB (ref 7–25)
CHLORIDE: 98 mmol/L (ref 98–110)
CO2: 23 mmol/L (ref 20–32)
Calcium: 11.5 mg/dL — ABNORMAL HIGH (ref 8.6–10.3)
Creat: 2.71 mg/dL — ABNORMAL HIGH (ref 0.70–1.11)
GFR, Est African American: 24 mL/min/{1.73_m2} — ABNORMAL LOW (ref >=60)
GFR, Est Non African American: 21 mL/min/{1.73_m2} — ABNORMAL LOW (ref >=60)
GLUCOSE: 70 mg/dL (ref 65–99)
Globulin: 3.4 g/dL (calc) (ref 1.9–3.7)
POTASSIUM: 5.5 mmol/L — AB (ref 3.5–5.3)
Sodium: 131 mmol/L — ABNORMAL LOW (ref 135–146)
Total Protein: 6.8 g/dL (ref 6.1–8.1)

## 2017-06-24 LAB — SEDIMENTATION RATE: SED RATE: 91 mm/h — AB (ref 0–20)

## 2017-06-24 NOTE — Progress Notes (Signed)
   Subjective:    Patient ID: [name], male    DOB: [birth year], 84 y.o.   MRN: [id number]  HPI Here for hsfu.   I saw him in March in the hospital with left hip infection following a fall, 4 months after BKA of the left leg.  He had a femoral neck fracture at that time in February 2019 and developed a deep hip infection.  Dr. Lorin Mercy in March did and I and D with purulent drainage.  His hardware was retained and culture grew MRSA.  He completed 6 weeks of IV vancomycin and is on bactrim DS twice a day.  He is complaining of continued pain today but no diarrhea, no rash.     Review of Systems  Constitutional: Negative for fatigue and fever.  Gastrointestinal: Negative for nausea.       Objective:   Physical Exam  Constitutional: He appears well-developed and well-nourished. No distress.  Eyes: No scleral icterus.  Cardiovascular: Normal rate, regular rhythm and normal heart sounds.  No murmur heard. Pulmonary/Chest: Effort normal and breath sounds normal. No respiratory distress.  Skin: No rash noted.   SH: lives in a [place]:

## 2017-06-24 NOTE — Assessment & Plan Note (Signed)
Stable now.  On treatment continuation for 6 months.  Will do suppressive therapy with 1 Bactrim DS daily after that.  If his creat worsens or K issues, can change to doxycycline.

## 2017-06-24 NOTE — Assessment & Plan Note (Signed)
Will check cmp today on bactrim.

## 2017-06-24 NOTE — Assessment & Plan Note (Signed)
Checking today. Can change to doxycyline if worsening on bactrim

## 2017-06-25 ENCOUNTER — Inpatient Hospital Stay (HOSPITAL_COMMUNITY): Payer: Medicare Other

## 2017-06-25 ENCOUNTER — Inpatient Hospital Stay (HOSPITAL_COMMUNITY)
Admission: EM | Admit: 2017-06-25 | Discharge: 2017-06-29 | DRG: 683 | Disposition: A | Discharge status: Skilled Nursing Facility | Payer: Medicare Other | Attending: Internal Medicine | Admitting: Internal Medicine

## 2017-06-25 ENCOUNTER — Encounter (HOSPITAL_COMMUNITY): Payer: Self-pay

## 2017-06-25 ENCOUNTER — Telehealth: Payer: Self-pay | Admitting: *Deleted

## 2017-06-25 DIAGNOSIS — N183 Chronic kidney disease, stage 3 unspecified: Secondary | ICD-10-CM | POA: Diagnosis present

## 2017-06-25 DIAGNOSIS — T8452XA Infection and inflammatory reaction due to internal left hip prosthesis, initial encounter: Secondary | ICD-10-CM | POA: Diagnosis not present

## 2017-06-25 DIAGNOSIS — I10 Essential (primary) hypertension: Secondary | ICD-10-CM | POA: Diagnosis not present

## 2017-06-25 DIAGNOSIS — M009 Pyogenic arthritis, unspecified: Secondary | ICD-10-CM | POA: Diagnosis present

## 2017-06-25 DIAGNOSIS — D631 Anemia in chronic kidney disease: Secondary | ICD-10-CM | POA: Diagnosis present

## 2017-06-25 DIAGNOSIS — F329 Major depressive disorder, single episode, unspecified: Secondary | ICD-10-CM | POA: Diagnosis present

## 2017-06-25 DIAGNOSIS — K219 Gastro-esophageal reflux disease without esophagitis: Secondary | ICD-10-CM | POA: Diagnosis present

## 2017-06-25 DIAGNOSIS — I5032 Chronic diastolic (congestive) heart failure: Secondary | ICD-10-CM | POA: Diagnosis present

## 2017-06-25 DIAGNOSIS — G4730 Sleep apnea, unspecified: Secondary | ICD-10-CM | POA: Diagnosis present

## 2017-06-25 DIAGNOSIS — Z89512 Acquired absence of left leg below knee: Secondary | ICD-10-CM

## 2017-06-25 DIAGNOSIS — I4510 Unspecified right bundle-branch block: Secondary | ICD-10-CM | POA: Diagnosis present

## 2017-06-25 DIAGNOSIS — T8459XA Infection and inflammatory reaction due to other internal joint prosthesis, initial encounter: Secondary | ICD-10-CM

## 2017-06-25 DIAGNOSIS — Z96649 Presence of unspecified artificial hip joint: Secondary | ICD-10-CM

## 2017-06-25 DIAGNOSIS — I2510 Atherosclerotic heart disease of native coronary artery without angina pectoris: Secondary | ICD-10-CM | POA: Diagnosis present

## 2017-06-25 DIAGNOSIS — I481 Persistent atrial fibrillation: Secondary | ICD-10-CM | POA: Diagnosis not present

## 2017-06-25 DIAGNOSIS — E869 Volume depletion, unspecified: Secondary | ICD-10-CM | POA: Diagnosis not present

## 2017-06-25 DIAGNOSIS — N179 Acute kidney failure, unspecified: Principal | ICD-10-CM | POA: Diagnosis present

## 2017-06-25 DIAGNOSIS — Z66 Do not resuscitate: Secondary | ICD-10-CM | POA: Diagnosis present

## 2017-06-25 DIAGNOSIS — Z951 Presence of aortocoronary bypass graft: Secondary | ICD-10-CM

## 2017-06-25 DIAGNOSIS — Z961 Presence of intraocular lens: Secondary | ICD-10-CM | POA: Diagnosis present

## 2017-06-25 DIAGNOSIS — E875 Hyperkalemia: Secondary | ICD-10-CM | POA: Diagnosis present

## 2017-06-25 DIAGNOSIS — Z89522 Acquired absence of left knee: Secondary | ICD-10-CM

## 2017-06-25 DIAGNOSIS — J449 Chronic obstructive pulmonary disease, unspecified: Secondary | ICD-10-CM | POA: Diagnosis not present

## 2017-06-25 DIAGNOSIS — D509 Iron deficiency anemia, unspecified: Secondary | ICD-10-CM | POA: Diagnosis present

## 2017-06-25 DIAGNOSIS — E785 Hyperlipidemia, unspecified: Secondary | ICD-10-CM | POA: Diagnosis present

## 2017-06-25 DIAGNOSIS — N189 Chronic kidney disease, unspecified: Secondary | ICD-10-CM | POA: Diagnosis present

## 2017-06-25 DIAGNOSIS — Y831 Surgical operation with implant of artificial internal device as the cause of abnormal reaction of the patient, or of later complication, without mention of misadventure at the time of the procedure: Secondary | ICD-10-CM | POA: Diagnosis not present

## 2017-06-25 DIAGNOSIS — Z79899 Other long term (current) drug therapy: Secondary | ICD-10-CM

## 2017-06-25 DIAGNOSIS — Z87891 Personal history of nicotine dependence: Secondary | ICD-10-CM

## 2017-06-25 DIAGNOSIS — Z7901 Long term (current) use of anticoagulants: Secondary | ICD-10-CM | POA: Diagnosis not present

## 2017-06-25 DIAGNOSIS — Z86718 Personal history of other venous thrombosis and embolism: Secondary | ICD-10-CM

## 2017-06-25 DIAGNOSIS — Z9842 Cataract extraction status, left eye: Secondary | ICD-10-CM

## 2017-06-25 DIAGNOSIS — I4819 Other persistent atrial fibrillation: Secondary | ICD-10-CM

## 2017-06-25 DIAGNOSIS — T8459XD Infection and inflammatory reaction due to other internal joint prosthesis, subsequent encounter: Secondary | ICD-10-CM | POA: Diagnosis not present

## 2017-06-25 DIAGNOSIS — E1122 Type 2 diabetes mellitus with diabetic chronic kidney disease: Secondary | ICD-10-CM | POA: Diagnosis not present

## 2017-06-25 DIAGNOSIS — N390 Urinary tract infection, site not specified: Secondary | ICD-10-CM | POA: Diagnosis not present

## 2017-06-25 DIAGNOSIS — D649 Anemia, unspecified: Secondary | ICD-10-CM

## 2017-06-25 DIAGNOSIS — I130 Hypertensive heart and chronic kidney disease with heart failure and stage 1 through stage 4 chronic kidney disease, or unspecified chronic kidney disease: Secondary | ICD-10-CM | POA: Diagnosis not present

## 2017-06-25 DIAGNOSIS — Z794 Long term (current) use of insulin: Secondary | ICD-10-CM

## 2017-06-25 DIAGNOSIS — H9190 Unspecified hearing loss, unspecified ear: Secondary | ICD-10-CM | POA: Diagnosis present

## 2017-06-25 DIAGNOSIS — N400 Enlarged prostate without lower urinary tract symptoms: Secondary | ICD-10-CM | POA: Diagnosis present

## 2017-06-25 DIAGNOSIS — T8459XS Infection and inflammatory reaction due to other internal joint prosthesis, sequela: Secondary | ICD-10-CM | POA: Diagnosis not present

## 2017-06-25 LAB — URINALYSIS, ROUTINE W REFLEX MICROSCOPIC
BACTERIA UA: NONE SEEN
Bilirubin Urine: NEGATIVE
Glucose, UA: NEGATIVE mg/dL
Hgb urine dipstick: NEGATIVE
KETONES UR: NEGATIVE mg/dL
Nitrite: NEGATIVE
PROTEIN: 100 mg/dL — AB
Specific Gravity, Urine: 1.012 (ref 1.005–1.030)
WBC, UA: >50 WBC/hpf — ABNORMAL HIGH (ref 0–5)
pH: 5 (ref 5.0–8.0)

## 2017-06-25 LAB — CBC WITH DIFFERENTIAL/PLATELET
Basophils Absolute: 0 10*3/uL (ref 0.0–0.1)
Basophils Relative: 1 %
EOS ABS: 0.2 10*3/uL (ref 0.0–0.7)
Eosinophils Relative: 3 %
HEMATOCRIT: 25.1 % — AB (ref 39.0–52.0)
HEMOGLOBIN: 7.8 g/dL — AB (ref 13.0–17.0)
LYMPHS ABS: 0.7 10*3/uL (ref 0.7–4.0)
Lymphocytes Relative: 11 %
MCH: 25.7 pg — AB (ref 26.0–34.0)
MCHC: 31.1 g/dL (ref 30.0–36.0)
MCV: 82.6 fL (ref 78.0–100.0)
MONO ABS: 0.7 10*3/uL (ref 0.1–1.0)
MONOS PCT: 9 %
NEUTROS PCT: 76 %
Neutro Abs: 5.4 10*3/uL (ref 1.7–7.7)
Platelets: 324 10*3/uL (ref 150–400)
RBC: 3.04 MIL/uL — ABNORMAL LOW (ref 4.22–5.81)
RDW: 14.4 % (ref 11.5–15.5)
WBC: 7.1 10*3/uL (ref 4.0–10.5)

## 2017-06-25 LAB — COMPREHENSIVE METABOLIC PANEL
ALBUMIN: 3.2 g/dL — AB (ref 3.5–5.0)
ALT: 22 U/L (ref 17–63)
AST: 15 U/L (ref 15–41)
Alkaline Phosphatase: 193 U/L — ABNORMAL HIGH (ref 38–126)
Anion gap: 9 (ref 5–15)
BUN: 53 mg/dL — ABNORMAL HIGH (ref 6–20)
CHLORIDE: 98 mmol/L — AB (ref 101–111)
CO2: 24 mmol/L (ref 22–32)
Calcium: 11.4 mg/dL — ABNORMAL HIGH (ref 8.9–10.3)
Creatinine, Ser: 2.66 mg/dL — ABNORMAL HIGH (ref 0.61–1.24)
GFR calc non Af Amer: 21 mL/min — ABNORMAL LOW (ref >60)
GFR, EST AFRICAN AMERICAN: 24 mL/min — AB (ref >60)
GLUCOSE: 140 mg/dL — AB (ref 65–99)
Potassium: 5.4 mmol/L — ABNORMAL HIGH (ref 3.5–5.1)
SODIUM: 131 mmol/L — AB (ref 135–145)
Total Bilirubin: 0.3 mg/dL (ref 0.3–1.2)
Total Protein: 7.6 g/dL (ref 6.5–8.1)

## 2017-06-25 LAB — GLUCOSE, CAPILLARY: Glucose-Capillary: 218 mg/dL — ABNORMAL HIGH (ref 65–99)

## 2017-06-25 LAB — POC OCCULT BLOOD, ED: FECAL OCCULT BLD: NEGATIVE

## 2017-06-25 LAB — SAMPLE TO BLOOD BANK

## 2017-06-25 MED ORDER — INSULIN ASPART 100 UNIT/ML ~~LOC~~ SOLN
0.0000 [IU] | Freq: Three times a day (TID) | SUBCUTANEOUS | Status: DC
  Administered 2017-06-26 (×2): 1 [IU] via SUBCUTANEOUS
  Administered 2017-06-26: 2 [IU] via SUBCUTANEOUS
  Administered 2017-06-27: 3 [IU] via SUBCUTANEOUS
  Administered 2017-06-28: 1 [IU] via SUBCUTANEOUS
  Administered 2017-06-28: 2 [IU] via SUBCUTANEOUS
  Administered 2017-06-28 – 2017-06-29 (×2): 1 [IU] via SUBCUTANEOUS

## 2017-06-25 MED ORDER — ONDANSETRON HCL 4 MG/2ML IJ SOLN: 4.0000 mg | Freq: Four times a day (QID) | INTRAMUSCULAR | Status: DC | PRN

## 2017-06-25 MED ORDER — MIRTAZAPINE 15 MG PO TABS
7.5000 mg | ORAL_TABLET | Freq: Every day | ORAL | Status: DC
  Administered 2017-06-25 – 2017-06-28 (×4): 7.5 mg via ORAL
  Filled 2017-06-25 (×4): qty 1

## 2017-06-25 MED ORDER — GLUCERNA SHAKE PO LIQD
237.0000 mL | Freq: Three times a day (TID) | ORAL | Status: DC
  Administered 2017-06-25 – 2017-06-29 (×8): 237 mL via ORAL

## 2017-06-25 MED ORDER — SODIUM CHLORIDE 0.9 % IV SOLN
125.0000 mg | Freq: Once | INTRAVENOUS | Status: DC
  Filled 2017-06-25: qty 10

## 2017-06-25 MED ORDER — FLUOXETINE HCL 20 MG PO CAPS
20.0000 mg | ORAL_CAPSULE | Freq: Every day | ORAL | Status: DC
  Administered 2017-06-25 – 2017-06-28 (×4): 20 mg via ORAL
  Filled 2017-06-25 (×4): qty 1

## 2017-06-25 MED ORDER — POLYETHYLENE GLYCOL 3350 17 G PO PACK
17.0000 g | PACK | Freq: Every day | ORAL | Status: DC
  Administered 2017-06-26 – 2017-06-29 (×4): 17 g via ORAL
  Filled 2017-06-25 (×4): qty 1

## 2017-06-25 MED ORDER — SENNOSIDES-DOCUSATE SODIUM 8.6-50 MG PO TABS: 1.0000 | ORAL_TABLET | Freq: Every evening | ORAL | Status: DC | PRN

## 2017-06-25 MED ORDER — ACETAMINOPHEN 325 MG PO TABS
650.0000 mg | ORAL_TABLET | Freq: Four times a day (QID) | ORAL | Status: DC | PRN
  Administered 2017-06-25: 650 mg via ORAL
  Filled 2017-06-25: qty 2

## 2017-06-25 MED ORDER — OXYCODONE HCL 5 MG PO TABS
5.0000 mg | ORAL_TABLET | Freq: Three times a day (TID) | ORAL | Status: DC | PRN
  Administered 2017-06-25 – 2017-06-27 (×2): 5 mg via ORAL
  Filled 2017-06-25 (×2): qty 1

## 2017-06-25 MED ORDER — TAMSULOSIN HCL 0.4 MG PO CAPS
0.4000 mg | ORAL_CAPSULE | Freq: Every day | ORAL | Status: DC
  Administered 2017-06-25 – 2017-06-28 (×4): 0.4 mg via ORAL
  Filled 2017-06-25 (×4): qty 1

## 2017-06-25 MED ORDER — DOCUSATE SODIUM 100 MG PO CAPS
100.0000 mg | ORAL_CAPSULE | Freq: Two times a day (BID) | ORAL | Status: DC
  Administered 2017-06-25 – 2017-06-29 (×8): 100 mg via ORAL
  Filled 2017-06-25 (×8): qty 1

## 2017-06-25 MED ORDER — ONDANSETRON HCL 4 MG PO TABS
4.0000 mg | ORAL_TABLET | Freq: Four times a day (QID) | ORAL | Status: DC | PRN
Start: 2017-06-25 — End: 2017-06-29
  Administered 2017-06-26: 4 mg via ORAL
  Filled 2017-06-25: qty 1

## 2017-06-25 MED ORDER — PRO-STAT SUGAR FREE PO LIQD
30.0000 mL | Freq: Two times a day (BID) | ORAL | Status: DC
  Administered 2017-06-25 – 2017-06-29 (×8): 30 mL via ORAL
  Filled 2017-06-25 (×8): qty 30

## 2017-06-25 MED ORDER — PANTOPRAZOLE SODIUM 40 MG PO TBEC
40.0000 mg | DELAYED_RELEASE_TABLET | Freq: Every day | ORAL | Status: DC
  Administered 2017-06-26 – 2017-06-29 (×4): 40 mg via ORAL
  Filled 2017-06-25 (×4): qty 1

## 2017-06-25 MED ORDER — FINASTERIDE 5 MG PO TABS
5.0000 mg | ORAL_TABLET | Freq: Every day | ORAL | Status: DC
  Administered 2017-06-26 – 2017-06-29 (×4): 5 mg via ORAL
  Filled 2017-06-25 (×5): qty 1

## 2017-06-25 MED ORDER — DOXYCYCLINE HYCLATE 100 MG PO TABS
100.0000 mg | ORAL_TABLET | Freq: Two times a day (BID) | ORAL | Status: DC
  Administered 2017-06-25 – 2017-06-29 (×8): 100 mg via ORAL
  Filled 2017-06-25 (×8): qty 1

## 2017-06-25 MED ORDER — ACETAMINOPHEN 650 MG RE SUPP: 650.0000 mg | Freq: Four times a day (QID) | RECTAL | Status: DC | PRN

## 2017-06-25 MED ORDER — SODIUM CHLORIDE 0.9 % IV SOLN
INTRAVENOUS | Status: AC
Start: 2017-06-25 — End: 2017-06-26
  Administered 2017-06-25: 19:00:00 via INTRAVENOUS

## 2017-06-25 MED ORDER — SODIUM CHLORIDE 0.9 % IV SOLN: 510.0000 mg | Freq: Once | INTRAVENOUS | Status: DC

## 2017-06-25 MED ORDER — APIXABAN 2.5 MG PO TABS
2.5000 mg | ORAL_TABLET | Freq: Two times a day (BID) | ORAL | Status: DC
  Administered 2017-06-25 – 2017-06-29 (×8): 2.5 mg via ORAL
  Filled 2017-06-25 (×8): qty 1

## 2017-06-25 MED ORDER — VITAMIN C 500 MG PO TABS
500.0000 mg | ORAL_TABLET | Freq: Two times a day (BID) | ORAL | Status: DC
  Administered 2017-06-25 – 2017-06-29 (×8): 500 mg via ORAL
  Filled 2017-06-25 (×8): qty 1

## 2017-06-25 NOTE — Telephone Encounter (Signed)
-----   Message from Thayer Headings, MD sent at 06/25/2017  9:45 AM EDT ----- Can you have the NH change him to doxycycline 100 mg twice a day (until I see him again) and stop the Bactrim.  His renal function is worse.  Need to recheck his creat in about 1 week.   thanks

## 2017-06-25 NOTE — ED Triage Notes (Signed)
Pt sent from [facility] due to decrease hemoglobin. Pt reports his bottom is sore. Denies CP and SOB

## 2017-06-25 NOTE — Telephone Encounter (Signed)
Verbal order given to Glendale Chard, nurse at [facility], to stop bactrim now and switch to doxycycline 100mg  by mouth twice daily until follow up 10/21 and order for repeat bmp in 1 week with lab results faxed back to [phone]. She will relay orders to doctor on site. Landis Gandy, RN

## 2017-06-25 NOTE — ED Provider Notes (Signed)
[facility] EMERGENCY DEPARTMENT Provider Note   CSN: [id number] Arrival date & time: 06/25/17  1300     History   Chief Complaint Chief Complaint  Patient presents with  . Abnormal Lab    HPI [name] is a 84 y.o. male.  HPI Patient sent in from nursing home due to anemia and worsening renal function.  History of infected hip and hip prosthesis.  Has been on Bactrim for this.  Seen infectious disease yesterday and had creatinine increased from1.25 up to 2.7.  BUN also increased.  Calcium also somewhat increased.  However hemoglobin is also gone down to 6.  Denies bleeding.  He states he does feel weak.  He is on Eliquis for atrial fibrillation. Past Medical History:  Diagnosis Date  . BPH (benign prostatic hyperplasia)   . CAD (coronary artery disease)    Multivessel status post CABG 2011 - LIMA to LAD, SVG to diagonal, SVG to OM, SVG to PDA  . Cellulitis    12/15  . Chronic back pain   . Chronic diastolic CHF (congestive heart failure) ([place])   . CKD (chronic kidney disease), stage III ([place])   . Closed hip fracture ([place]) 02/2017  . COPD (chronic obstructive pulmonary disease) ([place])   . Essential hypertension   . Gastric mass    EGD 9/15  . GERD (gastroesophageal reflux disease)   . History of DVT (deep vein thrombosis)    Postphlebitic syndrome  . History of kidney stones   . HOH (hard of hearing)   . Hx of CABG   . Hyperlipidemia   . Persistent atrial fibrillation ([place])    a. s/p DCCV 03/2016.  [name] Sleep apnea    Stop Bang score of 5  . Type 2 diabetes mellitus ([place])   . UTI (urinary tract infection) 11/2016    Patient Active Problem List   Diagnosis Date Noted  . Septic arthritis ([place]) 06/24/2017  . Medication monitoring encounter 06/24/2017  . Advance care planning   . Decreased appetite   . Palliative care by specialist   . Goals of care, counseling/discussion   . Wound infection after surgery 03/04/2017  . Closed left hip fracture, initial  encounter ([place]) 02/22/2017  . GERD (gastroesophageal reflux disease) 02/22/2017  . CKD (chronic kidney disease), stage III ([place]) 02/22/2017  . Hyperglycemia 02/22/2017  . Anemia in chronic kidney disease (CKD) 02/22/2017  . Hypercalcemia 02/22/2017  . Fall as cause of accidental injury at home as place of occurrence 02/22/2017  . S/P BKA (below knee amputation) unilateral, left ([place]) 02/22/2017  . Bilateral hip pain 02/22/2017  . Hyperbilirubinemia 02/22/2017  . COPD (chronic obstructive pulmonary disease) ([place]) 09/09/2016  . CAD (coronary artery disease) 09/09/2016  . Hyperlipidemia 09/09/2016  . Type 2 diabetes mellitus ([place]) 07/24/2016  . Pressure injury of skin 06/11/2016  . Atrial fibrillation with normal ventricular rate ([place]) 06/10/2016  . Esophageal stricture 06/09/2016  . CAD in native artery 06/08/2016  . Chronic diastolic CHF (congestive heart failure) ([place]) 03/19/2016  . Abnormal weight loss   . Chest pain at rest 12/18/2013  . Diabetes ([place]) 12/18/2013  . Chronic back pain 12/18/2013  . Dysphagia 12/18/2013  . Odynophagia 12/18/2013  . Gastric mass 09/07/2013  . Personal history of colonic polyps 08/05/2013  . Lower extremity edema 06/02/2012  . Essential hypertension 06/02/2012  . History of DVT (deep vein thrombosis) 06/02/2012  . Obesity (BMI 30-39.9): BMI 31.3 06/02/2012    Past Surgical History:  Procedure Laterality  Date  . AMPUTATION Left 10/07/2016   Procedure: AMPUTATION BELOW KNEE;  Surgeon: [name], MD;  Location: [place];  Service: Orthopedics;  Laterality: Left;  . CARDIOVERSION N/A 03/20/2016   Procedure: CARDIOVERSION;  Surgeon: Arnoldo Lenis, MD;  [facility];  Service: Endoscopy;  Laterality: N/A;  . CATARACT EXTRACTION W/PHACO Left 02/07/2016   Procedure: CATARACT EXTRACTION PHACO AND INTRAOCULAR LENS PLACEMENT (IOC);  Surgeon: [name], MD;  Location: [facility];  Service: Ophthalmology;  Laterality: Left;  CDE:  23.13  .  COLONOSCOPY  2004   Dr. Laural Golden: three small polyps at cecum, path unknown, external hemorrhoids  . COLONOSCOPY N/A 08/16/2013   Dr. [name]: incomplete prep. multiple tubular adenomas, multiple biopsies. Needs surveillance in Aug 2016 due to poor prep  . CORONARY ARTERY BYPASS GRAFT     x5  . CORONARY ARTERY BYPASS GRAFT  2011  . CYSTOSCOPY N/A 12/12/2012   Procedure: CYSTOSCOPY FLEXIBLE;  Surgeon: Marissa Nestle, MD;  Location: [facility];  Service: Urology;  Laterality: N/A;  . ESOPHAGEAL DILATION N/A 09/14/2016   Procedure: ESOPHAGEAL DILATION;  Surgeon: Daneil Dolin, MD;  [facility];  Service: Endoscopy;  Laterality: N/A;  . ESOPHAGOGASTRODUODENOSCOPY N/A 08/16/2013   Dr. [name]: normal esophagus s/p Maloney dilation, gastric erosions, submucosal gastric mass vs extrinsic mass  . ESOPHAGOGASTRODUODENOSCOPY (EGD) WITH PROPOFOL N/A 09/14/2016   Procedure: ESOPHAGOGASTRODUODENOSCOPY (EGD) WITH PROPOFOL;  Surgeon: Daneil Dolin, MD;  [facility];  Service: Endoscopy;  Laterality: N/A;  . EUS N/A 09/07/2013   Dr. 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 HIP ARTHROPLASTY Left 02/24/2017   Procedure: ARTHROPLASTY POSTERIOR (HEMIARTHROPLASTY);  Surgeon: [name], MD;  Location: [place];  Service: Orthopedics;  Laterality: Left;  . INCISION AND DRAINAGE ABSCESS N/A 12/20/2013   Procedure: INCISION AND DRAINAGE ABSCESS NECK;  Surgeon: Jamesetta So, MD;  Location: [facility];  Service: General;  Laterality: N/A;  . INCISION AND DRAINAGE ABSCESS Left 09/04/2016   Procedure: INCISION AND DRAINAGE ABSCESS;  Surgeon: Aviva Signs, MD;  Location: [facility];  Service: General;  Laterality: Left;  . INCISION AND DRAINAGE HIP Left 03/05/2017   Procedure: LEFT HIP IRRIGATION AND DEBRIDEMENT AND PLACEMENT OF ANTIBIOTIC BEADS;  Surgeon: [name], MD;  Location: [place];  Service: Orthopedics;  Laterality: Left;  . INCISION AND DRAINAGE HIP Left 03/10/2017   Procedure:  REPEAT IRRIGATION AND DEBRIDEMENT LEFT DEEP HIP INFECTION, ANTIBIOTIC BEAD PLACEMENT, [place];  Surgeon: [name], MD;  Location: [place];  Service: Orthopedics;  Laterality: Left;  [name] MALONEY DILATION N/A 08/16/2013   Procedure: Venia Minks DILATION;  Surgeon: Daneil Dolin, MD;  [facility];  Service: Endoscopy;  Laterality: N/A;        Home Medications    Prior to Admission medications   Medication Sig Start Date End Date Taking? Authorizing Provider  amLODipine (NORVASC) 10 MG tablet Take 1 tablet (10 mg total) by mouth daily. 09/27/16   Dondiego, Delfino Lovett, MD  apixaban (ELIQUIS) 5 MG TABS tablet Take 5 mg by mouth 2 (two) times daily.    [provider]  bisacodyl (DULCOLAX) 10 MG suppository Place 1 suppository (10 mg total) rectally daily as needed for moderate constipation. 02/27/17   Hongalgi, Lenis Dickinson, MD  cholecalciferol (VITAMIN D) 400 units TABS tablet Take 400 Units by mouth daily.    [provider]  docusate sodium (COLACE) 100 MG capsule Take  1 capsule (100 mg total) by mouth 2 (two) times daily. 02/27/17   Hongalgi, Lenis Dickinson, MD  feeding supplement, GLUCERNA SHAKE, (GLUCERNA SHAKE) LIQD Take 237 mLs by mouth 3 (three) times daily between meals. Patient taking differently: Take 237 mLs by mouth 5 (five) times daily.  02/27/17   Hongalgi, Lenis Dickinson, MD  finasteride (PROSCAR) 5 MG tablet Take 5 mg daily by mouth.    [provider]  FLUoxetine (PROZAC) 20 MG capsule Take 20 mg by mouth at bedtime.     [provider]  hydrALAZINE (APRESOLINE) 10 MG tablet Take 1 tablet (10 mg total) by mouth 2 (two) times daily. 03/18/17   Rai, Ripudeep K, MD  insulin aspart (NOVOLOG) 100 UNIT/ML injection Inject 0-9 Units into the skin 3 (three) times daily with meals. CBG < 70: implement hypoglycemia protocol CBG 70 - 120: 0 units CBG 121 - 150: 1 unit CBG 151 - 200: 2 units CBG 201 - 250: 3 units CBG 251 - 300: 5 units CBG 301 - 350: 7 units CBG  351 - 400: 9 units CBG > 400: call MD. 02/27/17   Modena Jansky, MD  insulin glargine (LANTUS) 100 UNIT/ML injection Inject 0.1 mLs (10 Units total) into the skin at bedtime. 03/18/17   Rai, Ripudeep K, MD  LORazepam (ATIVAN) 1 MG tablet Take 1 tablet (1 mg total) by mouth 2 (two) times daily as needed for anxiety or sleep. 03/18/17   Rai, Vernelle Emerald, MD  nitroGLYCERIN (NITROSTAT) 0.4 MG SL tablet Place 1 tablet (0.4 mg total) under the tongue every 5 (five) minutes as needed for chest pain. 12/22/13   [name], MD  oxyCODONE (OXY IR/ROXICODONE) 5 MG immediate release tablet Take 1 tablet (5 mg total) by mouth 3 (three) times daily as needed for severe pain or breakthrough pain. 03/18/17   Rai, Ripudeep K, MD  pantoprazole (PROTONIX) 40 MG tablet Take 40 mg by mouth daily.    [provider]  phenylephrine-shark liver oil-mineral oil-petrolatum (PREPARATION H) 0.25-3-14-71.9 % rectal ointment Place 1 application rectally daily.    [provider]  rosuvastatin (CRESTOR) 10 MG tablet Take 10 mg by mouth at bedtime.    [provider]  senna-docusate (SENOKOT-S) 8.6-50 MG tablet Take 1 tablet by mouth at bedtime as needed for mild constipation. Patient taking differently: Take 1 tablet by mouth daily as needed for mild constipation.  02/27/17   Hongalgi, Lenis Dickinson, MD  tamsulosin (FLOMAX) 0.4 MG CAPS capsule Take 0.4 mg by mouth daily after supper.     [provider]    Family History Family History  Problem Relation Age of Onset  . Colon cancer Son 2       deceased    Social History Social History   Tobacco Use  . Smoking status: Former Smoker    Packs/day: 1.00    Years: 11.00    Pack years: 11.00    Last attempt to quit: 01/05/1956    Years since quitting: 61.5  . Smokeless tobacco: Never Used  Substance Use Topics  . Alcohol use: No  . Drug use: No     Allergies   Patient has no known allergies.   Review of Systems Review of  Systems  Constitutional: Positive for fatigue. Negative for appetite change.  HENT: Negative for congestion.   Respiratory: Positive for shortness of breath.   Cardiovascular: Negative for chest pain.  Gastrointestinal: Negative for abdominal pain and blood in stool.  Genitourinary:  Negative for flank pain.  Musculoskeletal: Positive for back pain.  Neurological: Negative for weakness.  Psychiatric/Behavioral: Negative for confusion.     Physical Exam Updated Vital Signs BP 126/74 (BP Location: Right Arm)   Pulse 100   Temp 97.7 F (36.5 C) (Oral)   Resp 17   Wt 73.5 kg (162 lb)   SpO2 97%   BMI 25.37 kg/m   Physical Exam  Constitutional: He appears well-developed.  HENT:  Head: Atraumatic.  Eyes: Pupils are equal, round, and reactive to light.  Neck: Neck supple.  Cardiovascular: Normal rate.  Irregular rhythm  Abdominal: There is no tenderness.  Musculoskeletal: He exhibits no tenderness.  Neurological: He is alert.  Patient appears to be somewhat hard of hearing.  Skin: Skin is warm. Capillary refill takes less than 2 seconds.     ED Treatments / Results  Labs (all labs ordered are listed, but only abnormal results are displayed) Labs Reviewed  COMPREHENSIVE METABOLIC PANEL - Abnormal; Notable for the following components:      Result Value   Sodium 131 (*)    Potassium 5.4 (*)    Chloride 98 (*)    Glucose, Bld 140 (*)    BUN 53 (*)    Creatinine, Ser 2.66 (*)    Calcium 11.4 (*)    Albumin 3.2 (*)    Alkaline Phosphatase 193 (*)    GFR calc non Af Amer 21 (*)    GFR calc Af Amer 24 (*)    All other components within normal limits  CBC WITH DIFFERENTIAL/PLATELET - Abnormal; Notable for the following components:   RBC 3.04 (*)    Hemoglobin 7.8 (*)    HCT 25.1 (*)    MCH 25.7 (*)    All other components within normal limits  URINALYSIS, ROUTINE W REFLEX MICROSCOPIC  POC OCCULT BLOOD, ED  SAMPLE TO BLOOD BANK    EKG EKG  Interpretation  Date/Time:  Friday June 25 2017 13:07:10 EDT Ventricular Rate:  93 PR Interval:    QRS Duration: 141 QT Interval:  371 QTC Calculation: 462 R Axis:   160 Text Interpretation:  Atrial fibrillation RBBB and LPFB Confirmed by Davonna Belling [phone]) on 06/25/2017 1:54:13 PM   Radiology No results found.  Procedures Procedures (including critical care time)  Medications Ordered in ED Medications - No data to display   Initial Impression / Assessment and Plan / ED Course  I have reviewed the triage vital signs and the nursing notes.  Pertinent labs & imaging results that were available during my care of the patient were reviewed by me and considered in my medical decision making (see chart for details).     Patient sent in for anemia and worsening renal function. He has been on Bactrim and is the likely cause of the AKI. Sent in for Hgb of 6.8 but on recheck here it is 7.8. He also has been transfused at 7.4 in the past.  Discussed with Dr. Carles Collet from hospitalist, who will admit patient  Final Clinical Impressions(s) / ED Diagnoses   Final diagnoses:  AKI (acute kidney injury) ([place])  Anemia, unspecified type    ED Discharge Orders    None       Davonna Belling, MD 06/25/17 1457

## 2017-06-25 NOTE — H&P (Signed)
History and Physical  DUNBAR BURAS LYY:[id number] DOB: [birth year] DOA: 06/25/2017   PCP: Hilbert Corrigan, MD   Patient coming from: Home  Chief Complaint: generalized weakness and abnormal labs  HPI:  WESSLEY EMERT is a 84 y.o. male with medical history of  dCHF, COPD, CKD 3, HTN, CAD, DM2, HLD, BPH, persistent Afib on apixaban presented from [facility] due to abnormal labs and generalized weakness.  Unfortunately, the patient is a poor historian.  All of this history is obtained from review of the medical records.  The patient was recently admitted to the hospital from 02/22/2017 through 02/27/2017 after mechanical fall resulting in a left femoral neck fracture.  He underwent a left hemiarthroplasty at that time.  Unfortunately, the patient was readmitted to the hospital on 03/05/1931 1519 during which the patient was found to have MRSA bacteremia with infected left hip hardware.  The patient was also treated for acute on chronic renal failure as well as acute metabolic encephalopathy during that admission.  He was discharged to nursing facility to finish 6 weeks of vancomycin on 04/22/2017.  He was subsequently switched over to Bactrim DS, 1 tablet twice daily for the next 3 to 6 months.  The patient was seen by infectious disease, Dr. Linus Salmons, in the office on 06/24/2017.  The patient was continued on Bactrim DS although there have been concerns about worsening renal function on his current antibiotics.  Nevertheless, blood work was performed at his nursing facility on 06/23/2017 which showed a hemoglobin of 6.8 with elevated serum creatinine on BMP.  BMP at Dr. Linus Salmons office showed a serum creatinine of 2.71.  The patient himself complains of some generalized weakness, but denies any headache, fevers, chills, chest pain, shortness breath, nausea, vomiting, diarrhea, abdominal pain.  He has intermittent left hip pain.  In fact, the patient does not know why he was brought to the  hospital.  In the emergency department, the patient was afebrile hemodynamically stable saturating 99% on room air.  BMP showed a sodium 131 with potassium 5 4 and creatinine 2.66.  WBC was 7.1 with hemoglobin 7.8.  EKG showed sinus rhythm with right bundle branch block.  FOBT was negative.    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 -Discontinue Bactrim as discussed above -Start doxycycline for chronic suppression  Hyperkalemia -Anticipate improvement with IV fluids  Hypercalcemia -Likely due to hypercalcemia of immobility -Intact PTH -IV fluids  Anemia CKD/iron deficiency anemia -06/23/2017 iron saturation 8%, ferritin 209 -Serum L27 517 -Folic acid 00.1 -nulecit x 1 -FOBT neg  chronic diastolic CHF -Clinically compensated -03/19/2017 echo EF 55 to 60%, no WMA, PASP 32 -Daily weights -06/09/2016 echocardiogram--EF 60-65%, mild MR, PASP 42 -Hesitated to use ARB/ACEi in setting of CKD 3  Persistent atrial fibrillation -Continue apixaban -CHADSVASc = 6  Diabetes mellitus type 2 -NovoLog sliding scale -06/23/2017 hemoglobin A1c 6.3  COPD -Continue duo nebs -stable on RA  Essential hypertension -Holding  Amlodipine and hydralazine due to soft BP  Depression -continue prozac and mirtazapine  Coronary artery disease -no chest pain -EKG--no concerning ischemic changes          Past Medical History:  Diagnosis Date  . BPH (benign prostatic hyperplasia)   . CAD (coronary artery disease)    Multivessel status post CABG 2011 -  LIMA to LAD, SVG to diagonal, SVG to OM, SVG to PDA  . Cellulitis    12/15  . Chronic back pain   . Chronic diastolic CHF (congestive heart failure) ([place])   . CKD (chronic kidney disease),  stage III ([place])   . Closed hip fracture ([place]) 02/2017  . COPD (chronic obstructive pulmonary disease) ([place])   . Essential hypertension   . Gastric mass    EGD 9/15  . GERD (gastroesophageal reflux disease)   . History of DVT (deep vein thrombosis)    Postphlebitic syndrome  . History of kidney stones   . HOH (hard of hearing)   . Hx of CABG   . Hyperlipidemia   . Persistent atrial fibrillation ([place])    a. s/p DCCV 03/2016.  [name] Sleep apnea    Stop Bang score of 5  . Type 2 diabetes mellitus ([place])   . UTI (urinary tract infection) 11/2016   Past Surgical History:  Procedure Laterality Date  . AMPUTATION Left 10/07/2016   Procedure: AMPUTATION BELOW KNEE;  Surgeon: [name], MD;  Location: [place];  Service: Orthopedics;  Laterality: Left;  . CARDIOVERSION N/A 03/20/2016   Procedure: CARDIOVERSION;  Surgeon: Arnoldo Lenis, MD;  [facility];  Service: Endoscopy;  Laterality: N/A;  . CATARACT EXTRACTION W/PHACO Left 02/07/2016   Procedure: CATARACT EXTRACTION PHACO AND INTRAOCULAR LENS PLACEMENT (IOC);  Surgeon: [name], MD;  Location: [facility];  Service: Ophthalmology;  Laterality: Left;  CDE:  23.13  . COLONOSCOPY  2004   Dr. Laural Golden: three small polyps at cecum, path unknown, external hemorrhoids  . COLONOSCOPY N/A 08/16/2013   Dr. [name]: incomplete prep. multiple tubular adenomas, multiple biopsies. Needs surveillance in Aug 2016 due to poor prep  . CORONARY ARTERY BYPASS GRAFT     x5  . CORONARY ARTERY BYPASS GRAFT  2011  . CYSTOSCOPY N/A 12/12/2012   Procedure: CYSTOSCOPY FLEXIBLE;  Surgeon: Marissa Nestle, MD;  Location: [facility];  Service: Urology;  Laterality: N/A;  . ESOPHAGEAL DILATION N/A 09/14/2016   Procedure: ESOPHAGEAL DILATION;  Surgeon: Daneil Dolin, MD;  [facility];  Service: Endoscopy;  Laterality: N/A;  . ESOPHAGOGASTRODUODENOSCOPY N/A 08/16/2013   Dr. [name]: normal esophagus s/p Maloney dilation, gastric erosions, submucosal  gastric mass vs extrinsic mass  . ESOPHAGOGASTRODUODENOSCOPY (EGD) WITH PROPOFOL N/A 09/14/2016   Procedure: ESOPHAGOGASTRODUODENOSCOPY (EGD) WITH PROPOFOL;  Surgeon: Daneil Dolin, MD;  [facility];  Service: Endoscopy;  Laterality: N/A;  . EUS N/A 09/07/2013   Dr. 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 HIP ARTHROPLASTY Left 02/24/2017   Procedure: ARTHROPLASTY POSTERIOR (HEMIARTHROPLASTY);  Surgeon: [name], MD;  Location: [facility];  Service: Orthopedics;  Laterality: Left;  . INCISION AND DRAINAGE ABSCESS N/A 12/20/2013   Procedure: INCISION AND DRAINAGE ABSCESS NECK;  Surgeon: Jamesetta So, MD;  [facility];  Service: General;  Laterality: N/A;  . INCISION AND DRAINAGE ABSCESS Left 09/04/2016   Procedure: INCISION AND DRAINAGE ABSCESS;  Surgeon: Aviva Signs, MD;  Location[facility];  Service: General;  Laterality: Left;  . INCISION AND DRAINAGE HIP Left 03/05/2017   Procedure: LEFT HIP IRRIGATION AND DEBRIDEMENT AND PLACEMENT OF ANTIBIOTIC BEADS;  Surgeon: [name], MD;  Location: [facility];  Service: Orthopedics;  Laterality: Left;  . INCISION AND DRAINAGE HIP Left 03/10/2017   Procedure: REPEAT IRRIGATION AND DEBRIDEMENT LEFT DEEP HIP INFECTION, ANTIBIOTIC BEAD PLACEMENT, VAC PLACEMENT;  Surgeon: [name], MD;  Location: [facility];  Service: Orthopedics;  Laterality: Left;  [name] MALONEY DILATION N/A 08/16/2013   Procedure: Venia Minks DILATION;  Surgeon: Daneil Dolin, MD;  [facility];  Service: Endoscopy;  Laterality: N/A;   Social History:  reports that he quit smoking about 61 years ago. He has a 11.00 pack-year smoking history. He has never used smokeless tobacco. He reports that he does not drink alcohol or use drugs.   Family History  Problem Relation Age of Onset  . Colon cancer Son 89       deceased     No Known Allergies   Prior to Admission medications   Medication Sig Start Date End Date Taking? Authorizing Provider   Amino Acids-Protein Hydrolys (FEEDING SUPPLEMENT, PRO-STAT SUGAR FREE 64,) LIQD Take 30 mLs by mouth 2 (two) times daily.   Yes [provider]  amLODipine (NORVASC) 10 MG tablet Take 1 tablet (10 mg total) by mouth daily. 09/27/16  Yes Dondiego, Richard, MD  apixaban (ELIQUIS) 5 MG TABS tablet Take 2.5 mg by mouth 2 (two) times daily.    Yes [provider]  cholecalciferol (VITAMIN D) 400 units TABS tablet Take 400 Units by mouth daily.   Yes [provider]  docusate sodium (COLACE) 100 MG capsule Take 1 capsule (100 mg total) by mouth 2 (two) times daily. 02/27/17  Yes Hongalgi, Lenis Dickinson, MD  feeding supplement, GLUCERNA SHAKE, (GLUCERNA SHAKE) LIQD Take 237 mLs by mouth 3 (three) times daily between meals. Patient taking differently: Take 237 mLs by mouth 5 (five) times daily.  02/27/17  Yes Hongalgi, Lenis Dickinson, MD  finasteride (PROSCAR) 5 MG tablet Take 5 mg daily by mouth.   Yes [provider]  FLUoxetine (PROZAC) 20 MG capsule Take 20 mg by mouth at bedtime.    Yes [provider]  hydrALAZINE (APRESOLINE) 10 MG tablet Take 1 tablet (10 mg total) by mouth 2 (two) times daily. 03/18/17  Yes Rai, Ripudeep K, MD  insulin aspart (NOVOLOG) 100 UNIT/ML injection Inject 0-9 Units into the skin 3 (three) times daily with meals. CBG < 70: implement hypoglycemia protocol CBG 70 - 120: 0 units CBG 121 - 150: 1 unit CBG 151 - 200: 2 units CBG 201 - 250: 3 units CBG 251 - 300: 5 units CBG 301 - 350: 7 units CBG 351 - 400: 9 units CBG > 400: call MD. 02/27/17  Yes Hongalgi, Lenis Dickinson, MD  insulin glargine (LANTUS) 100 UNIT/ML injection Inject 0.1 mLs (10 Units total) into the skin at bedtime. 03/18/17  Yes Rai, Ripudeep K, MD  mirtazapine (REMERON) 7.5 MG tablet Take 7.5 mg by mouth at bedtime.   Yes [provider]  Multiple Vitamins-Minerals (CERTAGEN PO) Take 1 tablet by mouth daily.   Yes [provider]  nitroGLYCERIN (NITROSTAT) 0.4 MG SL  tablet Place 1 tablet (0.4 mg total) under the tongue every 5 (five) minutes as needed for chest pain. 12/22/13  Yes [name], MD  oxyCODONE (OXY IR/ROXICODONE) 5 MG immediate release tablet Take 1 tablet (5 mg total) by mouth 3 (three) times daily as needed for severe pain or breakthrough pain. Patient taking differently: Take 5 mg by mouth 4 (four) times daily.  03/18/17  Yes Rai, Ripudeep K, MD  pantoprazole (PROTONIX) 40 MG tablet Take 40 mg by mouth daily.   Yes [provider]  phenylephrine-shark liver oil-mineral oil-petrolatum (PREPARATION H) 0.25-3-14-71.9 % rectal ointment Place 1 application  rectally daily.   Yes [provider]  polyethylene glycol (MIRALAX / GLYCOLAX) packet Take 17 g by mouth daily.   Yes [provider]  senna-docusate (SENOKOT-S) 8.6-50 MG tablet Take 1 tablet by mouth at bedtime as needed for mild constipation. Patient taking differently: Take 1 tablet by mouth daily as needed for mild constipation.  02/27/17  Yes Hongalgi, Lenis Dickinson, MD  Skin Protectants, Misc. (MINERIN) CREA Apply topically. Apply to legs and ankles topically daily as needed for dry skin   Yes [provider]  sulfamethoxazole-trimethoprim (BACTRIM DS,SEPTRA DS) 800-160 MG tablet Take 1 tablet by mouth 2 (two) times daily.   Yes [provider]  tamsulosin (FLOMAX) 0.4 MG CAPS capsule Take 0.4 mg by mouth daily after supper.    Yes [provider]  vitamin C (ASCORBIC ACID) 500 MG tablet Take 500 mg by mouth 2 (two) times daily.   Yes [provider]    Review of Systems:  Constitutional:  No weight loss, night sweats, Fevers, chills, fatigue.  Head&Eyes: No headache.  No vision loss.  No eye pain or scotoma ENT:  No Difficulty swallowing,Tooth/dental problems,Sore throat,  No ear ache, post nasal drip,  Cardio-vascular:  No chest pain, Orthopnea, PND, swelling in lower extremities, GI:   No  abdominal pain, nausea,  vomiting, diarrhea, loss of appetite, hematochezia, melena, heartburn, indigestion, Resp:  No shortness of breath with exertion or at rest. No cough. No coughing up of blood .No wheezing Skin:  no rash or lesions.  GU:  no dysuria, change in color of urine, no urgency or frequency. No flank pain.  Musculoskeletal:  No joint pain or swelling. No decreased range of motion. No back pain.  Psych:  No change in mood or affect. No depression or anxiety. Neurologic: No headache, no dysesthesia, no focal weakness, no vision loss. No syncope  Physical Exam: Vitals:   06/25/17 1305 06/25/17 1306  BP: 126/74   Pulse: 100   Resp: 17   Temp: 97.7 F (36.5 C)   TempSrc: Oral   SpO2: 97%   Weight:  73.5 kg (162 lb)   General:  A&O x 2, NAD, nontoxic, pleasant/cooperative Head/Eye: No conjunctival hemorrhage, no icterus, [place]/AT, No nystagmus ENT:  No icterus,  No thrush, good dentition, no pharyngeal exudate Neck:  No masses, no lymphadenpathy, no bruits CV:  RRR, no rub, no gallop, no S3 Lung: Fine bibasilar crackles but no wheezing.  Good air movement. Abdomen: soft/NT, +BS, nondistended, no peritoneal signs Ext: No cyanosis, No rashes, No petechiae, No lymphangitis, No edema Neuro: CNII-XII intact, strength 4/5 in bilateral upper and lower extremities, no dysmetria  Labs on Admission:  Basic Metabolic Panel: Recent Labs  Lab 06/24/17 1121 06/25/17 1314  NA 131* 131*  K 5.5* 5.4*  CL 98 98*  CO2 23 24  GLUCOSE 70 140*  BUN 52* 53*  CREATININE 2.71* 2.66*  CALCIUM 11.5* 11.4*   Liver Function Tests: Recent Labs  Lab 06/24/17 1121 06/25/17 1314  AST 14 15  ALT 20 22  ALKPHOS  --  193*  BILITOT 0.3 0.3  PROT 6.8 7.6  ALBUMIN  --  3.2*   No results for input(s): LIPASE, AMYLASE in the last 168 hours. No results for input(s): AMMONIA in the last 168 hours. CBC: Recent Labs  Lab 06/25/17 1314  WBC 7.1  NEUTROABS 5.4  HGB 7.8*  HCT 25.1*  MCV 82.6  PLT 324    Coagulation Profile: No results for  input(s): INR, PROTIME in the last 168 hours. Cardiac Enzymes: No results for input(s): CKTOTAL, CKMB, CKMBINDEX, TROPONINI in the last 168 hours. BNP: Invalid input(s): POCBNP CBG: No results for input(s): GLUCAP in the last 168 hours. Urine analysis:    Component Value Date/Time   COLORURINE YELLOW 03/04/2017 0544   APPEARANCEUR HAZY (A) 03/04/2017 0544   APPEARANCEUR Cloudy (A) 11/12/2016 1101   LABSPEC 1.012 03/04/2017 0544   PHURINE 5.0 03/04/2017 0544   GLUCOSEU 50 (A) 03/04/2017 0544   HGBUR SMALL (A) 03/04/2017 0544   BILIRUBINUR NEGATIVE 03/04/2017 0544   BILIRUBINUR Negative 11/12/2016 1101   KETONESUR 5 (A) 03/04/2017 0544   PROTEINUR 100 (A) 03/04/2017 0544   UROBILINOGEN 0.2 12/19/2013 1905   NITRITE NEGATIVE 03/04/2017 0544   LEUKOCYTESUR NEGATIVE 03/04/2017 0544   LEUKOCYTESUR 2+ (A) 11/12/2016 1101   Sepsis Labs: @LABRCNTIP (procalcitonin:4,lacticidven:4) )No results found for this or any previous visit (from the past 240 hour(s)).   Radiological Exams on Admission: No results found.  EKG: Independently reviewed. Afib, RBBB    Time spent:70 minutes Code Status:   DNR Family Communication:  No Family at bedside Disposition Plan: expect 2-3 day hospitalization Consults called: none DVT Prophylaxis: apixaban  Orson Eva, DO  [facility] Pager [phone]  If 7PM-7AM, please contact night-coverage [email or web address] Password [facility] 06/25/2017, 3:25 PM

## 2017-06-26 ENCOUNTER — Encounter (HOSPITAL_COMMUNITY): Payer: Self-pay

## 2017-06-26 ENCOUNTER — Other Ambulatory Visit: Payer: Self-pay

## 2017-06-26 DIAGNOSIS — I5032 Chronic diastolic (congestive) heart failure: Secondary | ICD-10-CM | POA: Diagnosis not present

## 2017-06-26 DIAGNOSIS — N179 Acute kidney failure, unspecified: Secondary | ICD-10-CM | POA: Diagnosis not present

## 2017-06-26 DIAGNOSIS — I10 Essential (primary) hypertension: Secondary | ICD-10-CM | POA: Diagnosis not present

## 2017-06-26 DIAGNOSIS — N183 Chronic kidney disease, stage 3 (moderate): Secondary | ICD-10-CM | POA: Diagnosis not present

## 2017-06-26 LAB — GLUCOSE, CAPILLARY
GLUCOSE-CAPILLARY: 134 mg/dL — AB (ref 65–99)
GLUCOSE-CAPILLARY: 130 mg/dL — AB (ref 65–99)
Glucose-Capillary: 150 mg/dL — ABNORMAL HIGH (ref 65–99)
Glucose-Capillary: 173 mg/dL — ABNORMAL HIGH (ref 65–99)

## 2017-06-26 LAB — URINALYSIS, COMPLETE (UACMP) WITH MICROSCOPIC
BILIRUBIN URINE: NEGATIVE
Bacteria, UA: NONE SEEN
GLUCOSE, UA: NEGATIVE mg/dL
HGB URINE DIPSTICK: NEGATIVE
KETONES UR: NEGATIVE mg/dL
NITRITE: NEGATIVE
PH: 5 (ref 5.0–8.0)
Protein, ur: 100 mg/dL — AB
SPECIFIC GRAVITY, URINE: 1.009 (ref 1.005–1.030)

## 2017-06-26 LAB — PREPARE RBC (CROSSMATCH)

## 2017-06-26 LAB — COMPREHENSIVE METABOLIC PANEL
ALK PHOS: 171 U/L — AB (ref 38–126)
ALT: 18 U/L (ref 17–63)
AST: 13 U/L — AB (ref 15–41)
Albumin: 2.9 g/dL — ABNORMAL LOW (ref 3.5–5.0)
Anion gap: 9 (ref 5–15)
BUN: 51 mg/dL — AB (ref 6–20)
CALCIUM: 10.9 mg/dL — AB (ref 8.9–10.3)
CO2: 23 mmol/L (ref 22–32)
Chloride: 100 mmol/L — ABNORMAL LOW (ref 101–111)
Creatinine, Ser: 2.64 mg/dL — ABNORMAL HIGH (ref 0.61–1.24)
GFR calc Af Amer: 24 mL/min — ABNORMAL LOW (ref >60)
GFR calc non Af Amer: 21 mL/min — ABNORMAL LOW (ref >60)
GLUCOSE: 159 mg/dL — AB (ref 65–99)
POTASSIUM: 4.9 mmol/L (ref 3.5–5.1)
SODIUM: 132 mmol/L — AB (ref 135–145)
TOTAL PROTEIN: 6.7 g/dL (ref 6.5–8.1)
Total Bilirubin: 0.4 mg/dL (ref 0.3–1.2)

## 2017-06-26 LAB — CBC
HEMATOCRIT: 21.9 % — AB (ref 39.0–52.0)
HEMOGLOBIN: 6.9 g/dL — AB (ref 13.0–17.0)
MCH: 26.1 pg (ref 26.0–34.0)
MCHC: 31.5 g/dL (ref 30.0–36.0)
MCV: 83 fL (ref 78.0–100.0)
Platelets: 297 10*3/uL (ref 150–400)
RBC: 2.64 MIL/uL — ABNORMAL LOW (ref 4.22–5.81)
RDW: 14.6 % (ref 11.5–15.5)
WBC: 7.5 10*3/uL (ref 4.0–10.5)

## 2017-06-26 LAB — MRSA PCR SCREENING: MRSA BY PCR: POSITIVE — AB

## 2017-06-26 MED ORDER — SODIUM CHLORIDE 0.9% IV SOLUTION
Freq: Once | INTRAVENOUS | Status: AC
  Administered 2017-06-26: 16:00:00 via INTRAVENOUS

## 2017-06-26 MED ORDER — SODIUM CHLORIDE 0.9 % IV SOLN
125.0000 mg | Freq: Every day | INTRAVENOUS | Status: AC
  Administered 2017-06-26 – 2017-06-27 (×2): 125 mg via INTRAVENOUS
  Filled 2017-06-26 (×2): qty 10

## 2017-06-26 MED ORDER — SODIUM CHLORIDE 0.9 % IV SOLN
125.0000 mg | Freq: Every day | INTRAVENOUS | Status: DC
  Filled 2017-06-26 (×2): qty 10

## 2017-06-26 NOTE — Progress Notes (Addendum)
PROGRESS NOTE  [name] NLG:[id number] DOB: 1931-09-06 DOA: 06/25/2017 PCP: Hilbert Corrigan, MD  Brief History:  84 y.o. male with medical history of dCHF, COPD, CKD 3, HTN, CAD, DM2, HLD, BPH, persistent Afib on apixaban presented from [facility] due to abnormal labs and generalized weakness.  Unfortunately, the patient is a poor historian.  All of this history is obtained from review of the medical records.  The patient was recently admitted to the hospital from 02/22/2017 through 02/27/2017 after mechanical fall resulting in a left femoral neck fracture.  He underwent a left hemiarthroplasty at that time.  Unfortunately, the patient was readmitted to the hospital on 03/04/17 to 03/19/17 during which the patient was found to have MRSA bacteremia with infected left hip hardware.  The patient was also treated for acute on chronic renal failure as well as acute metabolic encephalopathy during that admission.  He was discharged to nursing facility to finish 6 weeks of vancomycin on 04/22/2017.  He was subsequently switched over to Bactrim DS, 1 tablet twice daily for the next 3 to 6 months.  The patient was seen by infectious disease, Dr. Linus Salmons, in the office on 06/24/2017.  The patient was continued on Bactrim DS although there have been concerns about worsening renal function on his current antibiotics.  Nevertheless, blood work was performed at his nursing facility on 06/23/2017 which showed a hemoglobin of 6.8 with elevated serum creatinine on BMP.  BMP at Dr. Linus Salmons office showed a serum creatinine of 2.71.  The patient himself complains of some generalized weakness, but denies any headache, fevers, chills, chest pain, shortness breath, nausea, vomiting, diarrhea, abdominal pain.  He has intermittent left hip pain.  In fact, the patient does not know why he was brought to the hospital.    Assessment/Plan: Acute on chronic renal failure--CKD stage III -Baseline creatinine  0.9-1.3 -Presenting creatinine 2.66 -slow/no improvement due to hemodynamic changes -Secondary to volume depletion, Bactrim, hemodynamic changes -Renal ultrasound--no hydronephrosis -Discontinue Bactrim -Judicious IV fluids -am BMP  Left prosthetic hip infection -Cultures have shown MRSA -Discontinue Bactrim as discussed above -Started doxycycline for chronic suppression  Hyperkalemia -Improvement with IV fluids  Hypercalcemia -Likely due to hypercalcemia of immobility -Intact PTH--pending -IV fluids  Anemia CKD/iron deficiency anemia -06/23/2017 iron saturation 8%, ferritin 209 -Serum Y81 448 -Folic acid 18.5  -baseline Hgb 7-8 -drop in Hgb due to dilution -nulecit x 2 -FOBT neg -transfuse 2 units PRBC  chronic diastolic CHF -Clinically compensated -03/19/2017 echo EF 55 to 60%, no WMA, PASP 32 -Daily weights -06/09/2016 echocardiogram--EF 60-65%, mild MR, PASP 42 -Hesitated to useARB/ACEi in setting of CKD 3  Pyuria -obtain urine culture  Persistent atrial fibrillation -Continue apixaban -CHADSVASc = 6   Diabetes mellitus type 2 -NovoLog sliding scale -06/23/2017 hemoglobin A1c 6.3  COPD -Continue duo nebs -stable on RA  Essential hypertension -Holding Amlodipine and hydralazine due to soft BP  Depression -continue prozac and mirtazapine  Coronary artery disease -no chest pain -EKG--personally reviewed--no concerning ischemic changes   Disposition Plan:   SNF 6/24 if stable Family Communication:  No Family at bedside  Consultants:    Code Status:  FULL / DNR  DVT Prophylaxis:  [place] Heparin /  Lovenox   Procedures: As Listed in Progress Note Above  Antibiotics: None    Subjective: Patient denies fevers, chills, headache, chest pain, dyspnea, nausea, vomiting, diarrhea, abdominal pain, dysuria, hematuria, hematochezia, and melena.   Objective:  Vitals:   06/25/17 1834 06/25/17 2216 06/26/17 0658 06/26/17 0923  BP:  104/72 (!) 111/57 (!) 124/56 105/85  Pulse: 99 81 79 90  Resp: 18 20 17 18   Temp: 98.5 F (36.9 C) 98.4 F (36.9 C) 97.6 F (36.4 C) (!) 97 F (36.1 C)  TempSrc: Oral Oral Oral Oral  SpO2: 100% 98% 100% 100%  Weight: 66.9 kg (147 lb 7.8 oz)     Height: 5\' 10"  (1.778 m)       Intake/Output Summary (Last 24 hours) at 06/26/2017 1046 Last data filed at 06/26/2017 1026 Gross per 24 hour  Intake 1183.75 ml  Output 600 ml  Net 583.75 ml   Weight change:  Exam:   General:  Pt is alert, follows commands appropriately, not in acute distress  HEENT: No icterus, No thrush, No neck mass, [place]/AT  Cardiovascular: IRRR, S1/S2, no rubs, no gallops  Respiratory: bibasilar crackles, no wheeze.  Good air movement  Abdomen: Soft/+BS, non tender, non distended, no guarding  Extremities: No edema, No lymphangitis, No petechiae, No rashes, no synovitis-LBKA without open wounds   Data Reviewed: I have personally reviewed following labs and imaging studies Basic Metabolic Panel: Recent Labs  Lab 06/24/17 1121 06/25/17 1314 06/26/17 0509  NA 131* 131* 132*  K 5.5* 5.4* 4.9  CL 98 98* 100*  CO2 23 24 23   GLUCOSE 70 140* 159*  BUN 52* 53* 51*  CREATININE 2.71* 2.66* 2.64*  CALCIUM 11.5* 11.4* 10.9*   Liver Function Tests: Recent Labs  Lab 06/24/17 1121 06/25/17 1314 06/26/17 0509  AST 14 15 13*  ALT 20 22 18   ALKPHOS  --  193* 171*  BILITOT 0.3 0.3 0.4  PROT 6.8 7.6 6.7  ALBUMIN  --  3.2* 2.9*   No results for input(s): LIPASE, AMYLASE in the last 168 hours. No results for input(s): AMMONIA in the last 168 hours. Coagulation Profile: No results for input(s): INR, PROTIME in the last 168 hours. CBC: Recent Labs  Lab 06/25/17 1314 06/26/17 0509  WBC 7.1 7.5  NEUTROABS 5.4  --   HGB 7.8* 6.9*  HCT 25.1* 21.9*  MCV 82.6 83.0  PLT 324 297   Cardiac Enzymes: No results for input(s): CKTOTAL, CKMB, CKMBINDEX, TROPONINI in the last 168 hours. BNP: Invalid input(s):  POCBNP CBG: Recent Labs  Lab 06/25/17 2133 06/26/17 0741  GLUCAP 218* 150*   HbA1C: No results for input(s): HGBA1C in the last 72 hours. Urine analysis:    Component Value Date/Time   COLORURINE YELLOW 06/25/2017 1906   APPEARANCEUR CLOUDY (A) 06/25/2017 1906   APPEARANCEUR Cloudy (A) 11/12/2016 1101   LABSPEC 1.012 06/25/2017 1906   PHURINE 5.0 06/25/2017 1906   GLUCOSEU NEGATIVE 06/25/2017 1906   HGBUR NEGATIVE 06/25/2017 1906   BILIRUBINUR NEGATIVE 06/25/2017 1906   BILIRUBINUR Negative 11/12/2016 1101   KETONESUR NEGATIVE 06/25/2017 1906   PROTEINUR 100 (A) 06/25/2017 1906   UROBILINOGEN 0.2 12/19/2013 1905   NITRITE NEGATIVE 06/25/2017 1906   LEUKOCYTESUR LARGE (A) 06/25/2017 1906   LEUKOCYTESUR 2+ (A) 11/12/2016 1101   Sepsis Labs: @LABRCNTIP (procalcitonin:4,lacticidven:4) )No results found for this or any previous visit (from the past 240 hour(s)).   Scheduled Meds: . apixaban  2.5 mg Oral BID  . docusate sodium  100 mg Oral BID  . doxycycline  100 mg Oral Q12H  . feeding supplement (GLUCERNA SHAKE)  237 mL Oral TID BM  . feeding supplement (PRO-STAT SUGAR FREE 64)  30 mL Oral BID  . finasteride  5 mg Oral Daily  . FLUoxetine  20 mg Oral QHS  . insulin aspart  0-9 Units Subcutaneous TID WC  . mirtazapine  7.5 mg Oral QHS  . pantoprazole  40 mg Oral Daily  . polyethylene glycol  17 g Oral Daily  . tamsulosin  0.4 mg Oral QPC supper  . vitamin C  500 mg Oral BID   Continuous Infusions: . sodium chloride 75 mL/hr at 06/25/17 1901    Procedures/Studies: US Renal  Result Date: 06/25/2017 CLINICAL DATA:  Acute on chronic renal failure EXAM: RENAL / URINARY TRACT ULTRASOUND COMPLETE COMPARISON:  CT 11/14/2016, ultrasound 10/09/2016 FINDINGS: Right Kidney: Length: 11.7 cm. Echogenicity within normal limits. No mass or hydronephrosis visualized. Left Kidney: Length: 12 cm. Echogenicity within normal limits. No mass or hydronephrosis visualized. Bladder: Appears  normal for degree of bladder distention.  Enlarged prostate. IMPRESSION: 1. Negative for hydronephrosis. Ultrasound appearance of the kidneys is within normal limits 2. Prostatomegaly Electronically Signed   By: Donavan Foil M.D.   On: 06/25/2017 18:21    Orson Eva, DO  [facility] Pager [phone]  If 7PM-7AM, please contact night-coverage [email or web address] Password [facility] 06/26/2017, 10:46 AM   LOS: 1 day

## 2017-06-26 NOTE — Progress Notes (Signed)
On call provider text paged regarding hgb of 6.9 at present,waiting for response.

## 2017-06-26 NOTE — Progress Notes (Signed)
CRITICAL VALUE ALERT  Critical Value:  MRSA POS  Date & Time Notied:  06/26/17  Provider Notified: TAT  Orders Received/Actions taken: contact orange

## 2017-06-27 DIAGNOSIS — I5032 Chronic diastolic (congestive) heart failure: Secondary | ICD-10-CM | POA: Diagnosis not present

## 2017-06-27 DIAGNOSIS — N183 Chronic kidney disease, stage 3 (moderate): Secondary | ICD-10-CM | POA: Diagnosis not present

## 2017-06-27 DIAGNOSIS — T8459XS Infection and inflammatory reaction due to other internal joint prosthesis, sequela: Secondary | ICD-10-CM

## 2017-06-27 DIAGNOSIS — I10 Essential (primary) hypertension: Secondary | ICD-10-CM | POA: Diagnosis not present

## 2017-06-27 DIAGNOSIS — N179 Acute kidney failure, unspecified: Secondary | ICD-10-CM | POA: Diagnosis not present

## 2017-06-27 LAB — CBC
HCT: 30.6 % — ABNORMAL LOW (ref 39.0–52.0)
HEMOGLOBIN: 9.7 g/dL — AB (ref 13.0–17.0)
MCH: 26.4 pg (ref 26.0–34.0)
MCHC: 31.7 g/dL (ref 30.0–36.0)
MCV: 83.2 fL (ref 78.0–100.0)
PLATELETS: 315 10*3/uL (ref 150–400)
RBC: 3.68 MIL/uL — ABNORMAL LOW (ref 4.22–5.81)
RDW: 14.6 % (ref 11.5–15.5)
WBC: 7.1 10*3/uL (ref 4.0–10.5)

## 2017-06-27 LAB — COMPREHENSIVE METABOLIC PANEL
ALK PHOS: 172 U/L — AB (ref 38–126)
ALT: 19 U/L (ref 17–63)
AST: 13 U/L — AB (ref 15–41)
Albumin: 3.1 g/dL — ABNORMAL LOW (ref 3.5–5.0)
Anion gap: 10 (ref 5–15)
BUN: 51 mg/dL — AB (ref 6–20)
CALCIUM: 11.1 mg/dL — AB (ref 8.9–10.3)
CHLORIDE: 102 mmol/L (ref 101–111)
CO2: 22 mmol/L (ref 22–32)
CREATININE: 2.31 mg/dL — AB (ref 0.61–1.24)
GFR calc Af Amer: 28 mL/min — ABNORMAL LOW (ref >60)
GFR calc non Af Amer: 25 mL/min — ABNORMAL LOW (ref >60)
GLUCOSE: 103 mg/dL — AB (ref 65–99)
Potassium: 5.2 mmol/L — ABNORMAL HIGH (ref 3.5–5.1)
SODIUM: 134 mmol/L — AB (ref 135–145)
Total Bilirubin: 0.6 mg/dL (ref 0.3–1.2)
Total Protein: 7 g/dL (ref 6.5–8.1)

## 2017-06-27 LAB — BPAM RBC
BLOOD PRODUCT EXPIRATION DATE: 201907132359
Blood Product Expiration Date: 201907152359
ISSUE DATE / TIME: 201906221551
ISSUE DATE / TIME: 201906221837
UNIT TYPE AND RH: 9500
UNIT TYPE AND RH: 9500

## 2017-06-27 LAB — TYPE AND SCREEN
ABO/RH(D): O NEG
Antibody Screen: NEGATIVE
UNIT DIVISION: 0
Unit division: 0

## 2017-06-27 LAB — GLUCOSE, CAPILLARY
GLUCOSE-CAPILLARY: 215 mg/dL — AB (ref 65–99)
Glucose-Capillary: 105 mg/dL — ABNORMAL HIGH (ref 65–99)
Glucose-Capillary: 105 mg/dL — ABNORMAL HIGH (ref 65–99)
Glucose-Capillary: 203 mg/dL — ABNORMAL HIGH (ref 65–99)

## 2017-06-27 MED ORDER — SODIUM CHLORIDE 0.9 % IV SOLN
INTRAVENOUS | Status: DC
  Administered 2017-06-27 – 2017-06-29 (×4): via INTRAVENOUS

## 2017-06-27 NOTE — Progress Notes (Signed)
PROGRESS NOTE  [name] OXB:[id number] DOB: Sep 02, 1931 DOA: 06/25/2017 PCP: Hilbert Corrigan, MD Brief History:  85 y.o.malewith medical history ofdCHF, COPD, CKD 3, HTN, CAD, DM2, HLD, BPH, persistent Afib on apixaban presented from [facility] due to abnormal labs and generalized weakness.Unfortunately, the patient is a poor historian. All of this history is obtained from review of the medical records.The patient was recently admitted to the hospital from 02/22/2017 through 02/27/2017 after mechanical fall resulting in a left femoral neck fracture. He underwent a left hemiarthroplasty at that time. Unfortunately, the patient was readmitted to the hospital on 03/04/17 to 03/19/17 during which the patient was found to have MRSA bacteremia with infected left hip hardware. The patient was also treated for acute on chronic renal failure as well as acute metabolic encephalopathy during that admission. He was discharged to nursing facility to finish 6 weeks of vancomycin on 04/22/2017. He was subsequently switched over to Bactrim DS, 1 tablet twice daily for the next 3 to 6 months. The patient was seen by infectious disease, Dr. Effie Berkshire the office on 06/24/2017. The patient was continued on Bactrim DS although there have been concerns about worsening renal function on his current antibiotics. Nevertheless, blood work was performed at his nursing facility on 06/23/2017 which showed a hemoglobin of 6.8 with elevated serum creatinine on BMP. BMP at Dr. Shirley Friar showed a serum creatinine of 2.71. The patient himself complains of some generalized weakness, but denies any headache, fevers, chills, chest pain, shortness breath, nausea, vomiting, diarrhea, abdominal pain. He has intermittent left hip pain. In fact, the patient does not know why he was brought to the hospital.    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 -Discontinue Bactrim as discussed above -Started doxycycline for chronic suppression  Hyperkalemia -Improvement with IV fluids  Hypercalcemia -Likely due to hypercalcemia of immobility -Intact PTH--pending -IV fluids  Anemia CKD/iron deficiency anemia -06/23/2017 iron saturation 8%, ferritin 209 -Serum A83 419 -Folic acid 62.2  -baseline Hgb 7-8 -drop in Hgb due to dilution -nulecit x 2 -FOBT neg -transfused 2 units PRBC  chronic diastolic CHF -Clinically compensated -03/19/2017 echo EF 55 to 60%, no WMA, PASP 32 -Daily weights -06/09/2016 echocardiogram--EF 60-65%, mild MR, PASP 42 -Hesitated to useARB/ACEi in setting of CKD 3  Pyuria -obtain urine culture  Persistent atrial fibrillation -Continue apixaban -CHADSVASc =6   Diabetes mellitus type 2 -NovoLog sliding scale -06/23/2017 hemoglobin A1c 6.3 -CBGs controlled  COPD -Continue duo nebs -stable on RA  Essential hypertension -HoldingAmlodipine and hydralazine due to soft BP  Depression -continue prozac and mirtazapine  Coronary artery disease -no chest pain -EKG--personally reviewed--no concerning ischemic changes   Disposition Plan:   SNF 6/24 or 6/25 if stable Family Communication:  No Family at bedside  Consultants:  none  Code Status:  DNR  DVT Prophylaxis:  apixaban   Procedures: As Listed in Progress Note Above  Antibiotics: None      Subjective: Patient denies fevers, chills, headache, chest pain, dyspnea, nausea, vomiting, diarrhea, abdominal pain, dysuria, hematuria, hematochezia, and melena.   Objective: Vitals:   06/26/17 2206 06/27/17 0035 06/27/17 0509  06/27/17 1340  BP: (!) 108/50 138/70 (!) 151/77 121/63  Pulse: 64 79 99  73  Resp: 15 16 16 18   Temp: 97.9 F (36.6 C) (!) 97.4 F (36.3 C) (!) 97.4 F (36.3 C) 98 F (36.7 C)  TempSrc: Oral Oral Oral Oral  SpO2: 98%  99% 98%  Weight:   69.6 kg (153 lb 7 oz)   Height:        Intake/Output Summary (Last 24 hours) at 06/27/2017 1727 Last data filed at 06/27/2017 1620 Gross per 24 hour  Intake 1446 ml  Output 1900 ml  Net -454 ml   Weight change: -3.883 kg (-8 lb 9 oz) Exam:   General:  Pt is alert, follows commands appropriately, not in acute distress  HEENT: No icterus, No thrush, No neck mass, [place]/AT  Cardiovascular: RRR, S1/S2, no rubs, no gallops  Respiratory: CTA bilaterally, no wheezing, no crackles, no rhonchi  Abdomen: Soft/+BS, non tender, non distended, no guarding  Extremities: No edema, No lymphangitis, No petechiae, No rashes, no synovitis   Data Reviewed: I have personally reviewed following labs and imaging studies Basic Metabolic Panel: Recent Labs  Lab 06/24/17 1121 06/25/17 1314 06/26/17 0509 06/27/17 0644  NA 131* 131* 132* 134*  K 5.5* 5.4* 4.9 5.2*  CL 98 98* 100* 102  CO2 23 24 23 22   GLUCOSE 70 140* 159* 103*  BUN 52* 53* 51* 51*  CREATININE 2.71* 2.66* 2.64* 2.31*  CALCIUM 11.5* 11.4* 10.9* 11.1*   Liver Function Tests: Recent Labs  Lab 06/24/17 1121 06/25/17 1314 06/26/17 0509 06/27/17 0644  AST 14 15 13* 13*  ALT 20 22 18 19   ALKPHOS  --  193* 171* 172*  BILITOT 0.3 0.3 0.4 0.6  PROT 6.8 7.6 6.7 7.0  ALBUMIN  --  3.2* 2.9* 3.1*   No results for input(s): LIPASE, AMYLASE in the last 168 hours. No results for input(s): AMMONIA in the last 168 hours. Coagulation Profile: No results for input(s): INR, PROTIME in the last 168 hours. CBC: Recent Labs  Lab 06/25/17 1314 06/26/17 0509 06/27/17 0644  WBC 7.1 7.5 7.1  NEUTROABS 5.4  --   --   HGB 7.8* 6.9* 9.7*  HCT 25.1* 21.9* 30.6*  MCV 82.6 83.0 83.2  PLT 324 297 315    Cardiac Enzymes: No results for input(s): CKTOTAL, CKMB, CKMBINDEX, TROPONINI in the last 168 hours. BNP: Invalid input(s): POCBNP CBG: Recent Labs  Lab 06/26/17 1628 06/26/17 2203 06/27/17 0722 06/27/17 1108 06/27/17 1611  GLUCAP 134* 130* 105* 105* 215*   HbA1C: No results for input(s): HGBA1C in the last 72 hours. Urine analysis:    Component Value Date/Time   COLORURINE YELLOW 06/26/2017 1730   APPEARANCEUR CLOUDY (A) 06/26/2017 1730   APPEARANCEUR Cloudy (A) 11/12/2016 1101   LABSPEC 1.009 06/26/2017 1730   PHURINE 5.0 06/26/2017 1730   GLUCOSEU NEGATIVE 06/26/2017 1730   HGBUR NEGATIVE 06/26/2017 1730   BILIRUBINUR NEGATIVE 06/26/2017 1730   BILIRUBINUR Negative 11/12/2016 1101   KETONESUR NEGATIVE 06/26/2017 1730   PROTEINUR 100 (A) 06/26/2017 1730   UROBILINOGEN 0.2 12/19/2013 1905   NITRITE NEGATIVE 06/26/2017 1730   LEUKOCYTESUR LARGE (A) 06/26/2017 1730   LEUKOCYTESUR 2+ (A) 11/12/2016 1101   Sepsis Labs: @LABRCNTIP (procalcitonin:4,lacticidven:4) ) Recent Results (from the past 240 hour(s))  MRSA PCR Screening     Status: Abnormal   Collection Time: 06/26/17  8:41 AM  Result Value Ref Range Status   MRSA by PCR POSITIVE (A) NEGATIVE Final    Comment:        The GeneXpert MRSA Assay (FDA approved  for NASAL specimens only), is one component of a comprehensive MRSA colonization surveillance program. It is not intended to diagnose MRSA infection nor to guide or monitor treatment for MRSA infections. RESULT CALLED TO, READ BACK BY AND VERIFIED WITH: HOBBS @ 1354 ON [id number] BY HENDERSON L. Performed at [facility], [address] [place], [place] [address]      Scheduled Meds: . apixaban  2.5 mg Oral BID  . docusate sodium  100 mg Oral BID  . doxycycline  100 mg Oral Q12H  . feeding supplement (GLUCERNA SHAKE)  237 mL Oral TID BM  . feeding supplement (PRO-STAT SUGAR FREE 64)  30 mL Oral BID  . finasteride  5 mg Oral Daily  . FLUoxetine  20  mg Oral QHS  . insulin aspart  0-9 Units Subcutaneous TID WC  . mirtazapine  7.5 mg Oral QHS  . pantoprazole  40 mg Oral Daily  . polyethylene glycol  17 g Oral Daily  . tamsulosin  0.4 mg Oral QPC supper  . vitamin C  500 mg Oral BID   Continuous Infusions: . sodium chloride 75 mL/hr at 06/27/17 1644    Procedures/Studies: US Renal  Result Date: 06/25/2017 CLINICAL DATA:  Acute on chronic renal failure EXAM: RENAL / URINARY TRACT ULTRASOUND COMPLETE COMPARISON:  CT 11/14/2016, ultrasound 10/09/2016 FINDINGS: Right Kidney: Length: 11.7 cm. Echogenicity within normal limits. No mass or hydronephrosis visualized. Left Kidney: Length: 12 cm. Echogenicity within normal limits. No mass or hydronephrosis visualized. Bladder: Appears normal for degree of bladder distention.  Enlarged prostate. IMPRESSION: 1. Negative for hydronephrosis. Ultrasound appearance of the kidneys is within normal limits 2. Prostatomegaly Electronically Signed   By: Donavan Foil M.D.   On: 06/25/2017 18:21    Orson Eva, DO  [facility] Pager [phone]  If 7PM-7AM, please contact night-coverage [email or web address] Password [email or web address] 06/27/2017, 5:27 PM   LOS: 2 days

## 2017-06-28 DIAGNOSIS — I5032 Chronic diastolic (congestive) heart failure: Secondary | ICD-10-CM | POA: Diagnosis not present

## 2017-06-28 DIAGNOSIS — N179 Acute kidney failure, unspecified: Secondary | ICD-10-CM | POA: Diagnosis not present

## 2017-06-28 DIAGNOSIS — I10 Essential (primary) hypertension: Secondary | ICD-10-CM | POA: Diagnosis not present

## 2017-06-28 DIAGNOSIS — N183 Chronic kidney disease, stage 3 (moderate): Secondary | ICD-10-CM | POA: Diagnosis not present

## 2017-06-28 LAB — GLUCOSE, CAPILLARY
GLUCOSE-CAPILLARY: 130 mg/dL — AB (ref 65–99)
GLUCOSE-CAPILLARY: 138 mg/dL — AB (ref 65–99)
Glucose-Capillary: 157 mg/dL — ABNORMAL HIGH (ref 65–99)
Glucose-Capillary: 166 mg/dL — ABNORMAL HIGH (ref 65–99)

## 2017-06-28 LAB — URINE CULTURE: Culture: <10000 — AB

## 2017-06-28 LAB — CBC
HCT: 29.8 % — ABNORMAL LOW (ref 39.0–52.0)
Hemoglobin: 9.2 g/dL — ABNORMAL LOW (ref 13.0–17.0)
MCH: 26.4 pg (ref 26.0–34.0)
MCHC: 30.9 g/dL (ref 30.0–36.0)
MCV: 85.4 fL (ref 78.0–100.0)
Platelets: 303 10*3/uL (ref 150–400)
RBC: 3.49 MIL/uL — ABNORMAL LOW (ref 4.22–5.81)
RDW: 14.7 % (ref 11.5–15.5)
WBC: 7.8 10*3/uL (ref 4.0–10.5)

## 2017-06-28 LAB — HEMOGLOBIN A1C
Hgb A1c MFr Bld: 6.2 % — ABNORMAL HIGH (ref 4.8–5.6)
Mean Plasma Glucose: 131.24 mg/dL

## 2017-06-28 LAB — MAGNESIUM: Magnesium: 1.9 mg/dL (ref 1.7–2.4)

## 2017-06-28 LAB — BASIC METABOLIC PANEL
Anion gap: 9 (ref 5–15)
BUN: 57 mg/dL — ABNORMAL HIGH (ref 6–20)
CO2: 23 mmol/L (ref 22–32)
Calcium: 10.6 mg/dL — ABNORMAL HIGH (ref 8.9–10.3)
Chloride: 102 mmol/L (ref 101–111)
Creatinine, Ser: 2.24 mg/dL — ABNORMAL HIGH (ref 0.61–1.24)
GFR calc non Af Amer: 25 mL/min — ABNORMAL LOW (ref >60)
GFR, EST AFRICAN AMERICAN: 29 mL/min — AB (ref >60)
Glucose, Bld: 152 mg/dL — ABNORMAL HIGH (ref 65–99)
Potassium: 5.3 mmol/L — ABNORMAL HIGH (ref 3.5–5.1)
SODIUM: 134 mmol/L — AB (ref 135–145)

## 2017-06-28 MED ORDER — CHLORHEXIDINE GLUCONATE CLOTH 2 % EX PADS
6.0000 | MEDICATED_PAD | Freq: Every day | CUTANEOUS | Status: DC
  Administered 2017-06-29: 6 via TOPICAL

## 2017-06-28 MED ORDER — MUPIROCIN 2 % EX OINT
1.0000 "application " | TOPICAL_OINTMENT | Freq: Two times a day (BID) | CUTANEOUS | Status: DC
  Administered 2017-06-28 – 2017-06-29 (×3): 1 via NASAL
  Filled 2017-06-28 (×2): qty 22

## 2017-06-28 MED ORDER — SODIUM POLYSTYRENE SULFONATE 15 GM/60ML PO SUSP
30.0000 g | Freq: Once | ORAL | Status: AC
  Administered 2017-06-28: 30 g via ORAL
  Filled 2017-06-28: qty 120

## 2017-06-28 NOTE — Clinical Social Work Note (Signed)
Clinical Social Work Assessment  Patient Details  Name: [name] MRN: [id number] Date of Birth: 1932/01/01  Date of referral:  06/28/17               Reason for consult:  Facility Placement                Permission sought to share information with:    Permission granted to share information::     Name::        Agency::  [facility], [place]  Relationship::     Contact Information:     Housing/Transportation Living arrangements for the past 2 months:  [place] of Information:  Facility Patient Interpreter Needed:    Criminal Activity/Legal Involvement Pertinent to Current Situation/Hospitalization:  No - Comment as needed Significant Relationships:  Adult Children Lives with:  Facility Resident Do you feel safe going back to the place where you live?  Yes Need for family participation in patient care:     Care giving concerns:  None identified. Facility resident.    Social Worker assessment / plan:  Patient has been a resident at [facility] for a few months.  He initially came as a short term resident for rehab. Patient's family applied for Medicaid but was denied.  [facility], continued to report patient's family has been private pay since last week. She indicated that the family plans on patient staying in the facility.  Patient is typically alert and somewhat confused. Patient uses a lift for transfers. He is in a wheelchair.     Employment status:  Retired Nurse, adult PT Recommendations:  Not assessed at this time Information / Referral to community resources:     Patient/Family's Response to care:  Patient's family desires for patient be long term per facility.   Patient/Family's Understanding of and Emotional Response to Diagnosis, Current Treatment, and Prognosis:  Family has been made aware of patient's diagnosis, treatment and prognosis.   Emotional Assessment Appearance:  Appears stated  age Attitude/Demeanor/Rapport:    Affect (typically observed):    Orientation:  Oriented to Place, Oriented to Self Alcohol / Substance use:  Not Applicable Psych involvement (Current and /or in the community):  No (Comment)  Discharge Needs  Concerns to be addressed:    Readmission within the last 30 days:  Yes Current discharge risk:  None Barriers to Discharge:  No Barriers Identified   Ihor Gully, LCSW 06/28/2017, 2:48 PM

## 2017-06-28 NOTE — Care Management Important Message (Signed)
Important Message  Patient Details  Name: [name] MRN: [id number] Date of Birth: 29-Oct-1931   Medicare Important Message Given:  Yes    [name] 06/28/2017, 12:27 PM

## 2017-06-28 NOTE — Progress Notes (Addendum)
PROGRESS NOTE  [name] XHB:[id number] DOB: 08/29/1931 DOA: 06/25/2017 PCP: Hilbert Corrigan, MD  Brief History: 85 y.o.malewith medical history ofdCHF, COPD, CKD 3, HTN, CAD, DM2, HLD, BPH, persistent Afib on apixaban presented from [facility] due to abnormal labs and generalized weakness.Unfortunately, the patient is a poor historian. All of this history is obtained from review of the medical records.The patient was recently admitted to the hospital from 02/22/2017 through 02/27/2017 after mechanical fall resulting in a left femoral neck fracture. He underwent a left hemiarthroplasty at that time. Unfortunately, the patient was readmitted to the hospital on 03/04/17 to 3/15/19during which the patient was found to have MRSA bacteremia with infected left hip hardware. The patient was also treated for acute on chronic renal failure as well as acute metabolic encephalopathy during that admission. He was discharged to nursing facility to finish 6 weeks of vancomycin on 04/22/2017. He was subsequently switched over to Bactrim DS, 1 tablet twice daily for the next 3 to 6 months. The patient was seen by infectious disease, Dr. Effie Berkshire the office on 06/24/2017. The patient was continued on Bactrim DS although there have been concerns about worsening renal function on his current antibiotics. Nevertheless, blood work was performed at his nursing facility on 06/23/2017 which showed a hemoglobin of 6.8 with elevated serum creatinine on BMP. BMP at Dr. Shirley Friar showed a serum creatinine of 2.71. The patient himself complains of some generalized weakness, but denies any headache, fevers, chills, chest pain, shortness breath, nausea, vomiting, diarrhea, abdominal pain. He has intermittent left hip pain. In fact, the patient does not know why he was brought to the hospital.    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 -Discontinue Bactrim as discussed above -Starteddoxycycline for chronic suppression  Hyperkalemia -Kayexalate x 1  Hypercalcemia -Likely due to hypercalcemia of immobility -Intact PTH--13 -IV fluids-->improving -25 vitamin D -1,25 vitamin D -TSH  Anemia CKD/iron deficiency anemia -06/23/2017 iron saturation 8%, ferritin 209 -Serum Y10 175 -Folic acid 10.2 -baseline Hgb 7-8 -drop in Hgb due to dilution -nulecit x2 -FOBT neg -transfused 2 units PRBC  chronic diastolic CHF -Clinically compensated -03/19/2017 echo EF 55 to 60%, no WMA, PASP 32 -Daily weights -06/09/2016 echocardiogram--EF 60-65%, mild MR, PASP 42 -Hesitated to useARB/ACEi in setting of CKD 3  Pyuria -obtain urine culture-->less than 10 K colonies  Persistent atrial fibrillation -Continue apixaban -CHADSVASc =6   Diabetes mellitus type 2 -NovoLog sliding scale -06/23/2017 hemoglobin A1c 6.3 -CBGs controlled  COPD -Continue duo nebs -stable on RA  Essential hypertension -HoldingAmlodipine and hydralazine due to soft BP  Depression -continue prozac and mirtazapine  Coronary artery disease -no chest pain -EKG--personally reviewed--no concerning ischemic changes   Disposition Plan:SNF 6/25 if stable and serum creatinine improved Family Communication:NoFamily at bedside  Consultants: none  Code Status: DNR  DVT Prophylaxis: apixaban   Procedures: As Listed in Progress Note Above  Antibiotics: None      Subjective: Patient denies fevers, chills, headache, chest pain, dyspnea, nausea, vomiting, diarrhea, abdominal pain, dysuria,  hematuria, hematochezia, and melena.   Objective: Vitals:   06/27/17 2254 06/28/17 0602 06/28/17 0822 06/28/17 1422  BP: 135/61 (!) 152/66  125/64  Pulse: 61 65  82  Resp: 15 16  18   Temp: 98.6 F (37 C) 99 F (37.2 C)  98 F (36.7 C)  TempSrc: Oral Oral    SpO2: 97% 100%  98%  Weight:   72.1 kg (158 lb 15.2 oz)   Height:        Intake/Output Summary (Last 24 hours) at 06/28/2017 1646 Last data filed at 06/28/2017 0700 Gross per 24 hour  Intake 240 ml  Output 1500 ml  Net -1260 ml   Weight change:  Exam:   General:  Pt is alert, follows commands appropriately, not in acute distress  HEENT: No icterus, No thrush, No neck mass, [place]/AT  Cardiovascular: RRR, S1/S2, no rubs, no gallops  Respiratory: bibasilar crackles, no wheeze  Abdomen: Soft/+BS, non tender, non distended, no guarding  Extremities: No edema, No lymphangitis, No petechiae, No rashes, no synovitis   Data Reviewed: I have personally reviewed following labs and imaging studies Basic Metabolic Panel: Recent Labs  Lab 06/24/17 1121 06/25/17 1314 06/26/17 0509 06/27/17 0644 06/28/17 0357  NA 131* 131* 132* 134* 134*  K 5.5* 5.4* 4.9 5.2* 5.3*  CL 98 98* 100* 102 102  CO2 23 24 23 22 23   GLUCOSE 70 140* 159* 103* 152*  BUN 52* 53* 51* 51* 57*  CREATININE 2.71* 2.66* 2.64* 2.31* 2.24*  CALCIUM 11.5* 11.4* 10.9* 11.1* 10.6*  MG  --   --   --   --  1.9   Liver Function Tests: Recent Labs  Lab 06/24/17 1121 06/25/17 1314 06/26/17 0509 06/27/17 0644  AST 14 15 13* 13*  ALT 20 22 18 19   ALKPHOS  --  193* 171* 172*  BILITOT 0.3 0.3 0.4 0.6  PROT 6.8 7.6 6.7 7.0  ALBUMIN  --  3.2* 2.9* 3.1*   No results for input(s): LIPASE, AMYLASE in the last 168 hours. No results for input(s): AMMONIA in the last 168 hours. Coagulation Profile: No results for input(s): INR, PROTIME in the last 168 hours. CBC: Recent Labs  Lab 06/25/17 1314 06/26/17 0509 06/27/17 0644 06/28/17 0357  WBC 7.1 7.5 7.1  7.8  NEUTROABS 5.4  --   --   --   HGB 7.8* 6.9* 9.7* 9.2*  HCT 25.1* 21.9* 30.6* 29.8*  MCV 82.6 83.0 83.2 85.4  PLT 324 297 315 303   Cardiac Enzymes: No results for input(s): CKTOTAL, CKMB, CKMBINDEX, TROPONINI in the last 168 hours. BNP: Invalid input(s): POCBNP CBG: Recent Labs  Lab 06/27/17 1611 06/27/17 2253 06/28/17 0806 06/28/17 1116 06/28/17 1642  GLUCAP 215* 203* 130* 157* 138*   HbA1C: Recent Labs    06/28/17 0357  HGBA1C 6.2*   Urine analysis:    Component Value Date/Time   COLORURINE YELLOW 06/26/2017 1730   APPEARANCEUR CLOUDY (A) 06/26/2017 1730   APPEARANCEUR Cloudy (A) 11/12/2016 1101   LABSPEC 1.009 06/26/2017 1730   PHURINE 5.0 06/26/2017 1730   GLUCOSEU NEGATIVE 06/26/2017 1730   HGBUR NEGATIVE 06/26/2017 1730   BILIRUBINUR NEGATIVE 06/26/2017 1730   BILIRUBINUR Negative 11/12/2016 1101   KETONESUR NEGATIVE 06/26/2017 1730   PROTEINUR 100 (A) 06/26/2017 1730   UROBILINOGEN 0.2 12/19/2013 1905   NITRITE NEGATIVE 06/26/2017 1730   LEUKOCYTESUR LARGE (A) 06/26/2017 1730   LEUKOCYTESUR 2+ (A) 11/12/2016 1101   Sepsis Labs: @LABRCNTIP (procalcitonin:4,lacticidven:4) ) Recent Results (from the past 240 hour(s))  MRSA PCR Screening     Status: Abnormal   Collection Time: 06/26/17  8:41 AM  Result Value Ref Range Status   MRSA  by PCR POSITIVE (A) NEGATIVE Final    Comment:        The GeneXpert MRSA Assay (FDA approved for NASAL specimens only), is one component of a comprehensive MRSA colonization surveillance program. It is not intended to diagnose MRSA infection nor to guide or monitor treatment for MRSA infections. RESULT CALLED TO, READ BACK BY AND VERIFIED WITH: HOBBS @ 1354 ON [id number] BY HENDERSON L. Performed at [facility], [address], [place], [place] [address]   Culture, Urine     Status: Abnormal   Collection Time: 06/26/17  5:30 PM  Result Value Ref Range Status   Specimen Description   Final    URINE, CLEAN  CATCH Performed at [facility], [address] [place], [place] [address]    Special Requests   Final    NONE Performed at [facility], [address], [place], [place] [address]    Culture (A)  Final    <10,000 COLONIES/mL INSIGNIFICANT GROWTH Performed at [facility] [address] [place], [place] [address]    Report Status 06/28/2017 FINAL  Final     Scheduled Meds: . apixaban  2.5 mg Oral BID  . Chlorhexidine Gluconate Cloth  6 each Topical Q0600  . docusate sodium  100 mg Oral BID  . doxycycline  100 mg Oral Q12H  . feeding supplement (GLUCERNA SHAKE)  237 mL Oral TID BM  . feeding supplement (PRO-STAT SUGAR FREE 64)  30 mL Oral BID  . finasteride  5 mg Oral Daily  . FLUoxetine  20 mg Oral QHS  . insulin aspart  0-9 Units Subcutaneous TID WC  . mirtazapine  7.5 mg Oral QHS  . mupirocin ointment  1 application Nasal BID  . pantoprazole  40 mg Oral Daily  . polyethylene glycol  17 g Oral Daily  . tamsulosin  0.4 mg Oral QPC supper  . vitamin C  500 mg Oral BID   Continuous Infusions: . sodium chloride 125 mL/hr at 06/28/17 1628    Procedures/Studies: US Renal  Result Date: 06/25/2017 CLINICAL DATA:  Acute on chronic renal failure EXAM: RENAL / URINARY TRACT ULTRASOUND COMPLETE COMPARISON:  CT 11/14/2016, ultrasound 10/09/2016 FINDINGS: Right Kidney: Length: 11.7 cm. Echogenicity within normal limits. No mass or hydronephrosis visualized. Left Kidney: Length: 12 cm. Echogenicity within normal limits. No mass or hydronephrosis visualized. Bladder: Appears normal for degree of bladder distention.  Enlarged prostate. IMPRESSION: 1. Negative for hydronephrosis. Ultrasound appearance of the kidneys is within normal limits 2. Prostatomegaly Electronically Signed   By: Donavan Foil M.D.   On: 06/25/2017 18:21    Orson Eva, DO  [facility] Pager [phone]  If 7PM-7AM, please contact night-coverage [email or web address] Password [email or web address] 06/28/2017, 4:46 PM   LOS:  3 days

## 2017-06-28 NOTE — [place] FL2 (Signed)
[place] MEDICAID FL2 LEVEL OF CARE SCREENING TOOL     IDENTIFICATION  Patient Name: [name] Birthdate: Aug 07, 1931 Sex: male Admission Date (Current Location): 06/25/2017  [facility] and [place] Number:  [employer] and Address:  [place] [address] [place]      Provider Number: [id number]  Attending Physician Name and Address:  Orson Eva, MD  Relative Name and Phone Number:       Current Level of Care: Hospital Recommended Level of Care: [place] Prior Approval Number:    Date Approved/Denied:   PASRR Number:    Discharge Plan:      Current Diagnoses: Patient Active Problem List   Diagnosis Date Noted  . Acute renal failure superimposed on stage 3 chronic kidney disease ([place]) 06/25/2017  . Prosthetic hip infection ([place]) 06/25/2017  . Septic arthritis ([place]) 06/24/2017  . Medication monitoring encounter 06/24/2017  . Advance care planning   . Decreased appetite   . Palliative care by specialist   . Goals of care, counseling/discussion   . Wound infection after surgery 03/04/2017  . Closed left hip fracture, initial encounter ([place]) 02/22/2017  . GERD (gastroesophageal reflux disease) 02/22/2017  . CKD (chronic kidney disease), stage III ([place]) 02/22/2017  . Hyperglycemia 02/22/2017  . Anemia in chronic kidney disease (CKD) 02/22/2017  . Hypercalcemia 02/22/2017  . Fall as cause of accidental injury at home as place of occurrence 02/22/2017  . S/P BKA (below knee amputation) unilateral, left ([place]) 02/22/2017  . Bilateral hip pain 02/22/2017  . Hyperbilirubinemia 02/22/2017  . COPD (chronic obstructive pulmonary disease) ([place]) 09/09/2016  . CAD (coronary artery disease) 09/09/2016  . Hyperlipidemia 09/09/2016  . Type 2 diabetes mellitus ([place]) 07/24/2016  . Pressure injury of skin 06/11/2016  . Atrial fibrillation with normal ventricular rate ([place]) 06/10/2016  . Esophageal stricture 06/09/2016  .  CAD in native artery 06/08/2016  . Persistent atrial fibrillation ([place]) 06/08/2016  . Chronic diastolic CHF (congestive heart failure) ([place]) 03/19/2016  . Abnormal weight loss   . Chest pain at rest 12/18/2013  . Diabetes ([place]) 12/18/2013  . Chronic back pain 12/18/2013  . Dysphagia 12/18/2013  . Odynophagia 12/18/2013  . Gastric mass 09/07/2013  . Personal history of colonic polyps 08/05/2013  . Lower extremity edema 06/02/2012  . Essential hypertension 06/02/2012  . History of DVT (deep vein thrombosis) 06/02/2012  . Obesity (BMI 30-39.9): BMI 31.3 06/02/2012    Orientation RESPIRATION BLADDER Height & Weight     Self, Place  Normal Incontinent Weight: 158 lb 15.2 oz (72.1 kg) Height:  5\' 10"  (177.8 cm)  BEHAVIORAL SYMPTOMS/MOOD NEUROLOGICAL BOWEL NUTRITION STATUS      Incontinent Diet(Carb Modified)  AMBULATORY STATUS COMMUNICATION OF NEEDS Skin   Total Care(uses wheelchair ) Verbally Normal                       Personal Care Assistance Level of Assistance  Bathing, Feeding, Dressing Bathing Assistance: Limited assistance Feeding assistance: Independent Dressing Assistance: Limited assistance     Functional Limitations Info  Sight, Hearing, Speech Sight Info: Adequate Hearing Info: Adequate Speech Info: Adequate    SPECIAL CARE FACTORS FREQUENCY                       Contractures Contractures Info: Not present    Additional Factors Info  Code Status, Allergies, Psychotropic Code Status Info: DNR Allergies Info: NKA Psychotropic Info: Prozac, Remeron  Current Medications (06/28/2017):  This is the current hospital active medication list Current Facility-Administered Medications  Medication Dose Route Frequency Provider Last Rate Last Dose  . 0.9 %  sodium chloride infusion   Intravenous Continuous Tat, Shanon Brow, MD 75 mL/hr at 06/28/17 2035    . acetaminophen (TYLENOL) tablet 650 mg  650 mg Oral Q6H PRN Tat, Shanon Brow, MD   650 mg at 06/25/17  1902   Or  . acetaminophen (TYLENOL) suppository 650 mg  650 mg Rectal Q6H PRN Tat, Shanon Brow, MD      . apixaban Arne [place]) tablet 2.5 mg  2.5 mg Oral BID Tat, Shanon Brow, MD   2.5 mg at 06/28/17 1131  . Chlorhexidine Gluconate Cloth 2 % PADS 6 each  6 each Topical Q0600 Tat, David, MD      . docusate sodium (COLACE) capsule 100 mg  100 mg Oral BID Tat, David, MD   100 mg at 06/28/17 1131  . doxycycline (VIBRA-TABS) tablet 100 mg  100 mg Oral Therisa Doyne, MD   100 mg at 06/28/17 1130  . feeding supplement (GLUCERNA SHAKE) (GLUCERNA SHAKE) liquid 237 mL  237 mL Oral TID BM Orson Eva, MD   237 mL at 06/27/17 2034  . feeding supplement (PRO-STAT SUGAR FREE 64) liquid 30 mL  30 mL Oral BID Tat, David, MD   30 mL at 06/28/17 1130  . finasteride (PROSCAR) tablet 5 mg  5 mg Oral Daily Tat, David, MD   5 mg at 06/28/17 1140  . FLUoxetine (PROZAC) capsule 20 mg  20 mg Oral Benay Pike, MD   20 mg at 06/27/17 2210  . insulin aspart (novoLOG) injection 0-9 Units  0-9 Units Subcutaneous TID WC Orson Eva, MD   2 Units at 06/28/17 1140  . mirtazapine (REMERON) tablet 7.5 mg  7.5 mg Oral Benay Pike, MD   7.5 mg at 06/27/17 2210  . mupirocin ointment (BACTROBAN) 2 % 1 application  1 application Nasal BID Tat, David, MD      . ondansetron [facility]) tablet 4 mg  4 mg Oral Q6H PRN Tat, David, MD   4 mg at 06/26/17 1718   Or  . ondansetron (ZOFRAN) injection 4 mg  4 mg Intravenous Q6H PRN Tat, David, MD      . oxyCODONE (Oxy IR/ROXICODONE) immediate release tablet 5 mg  5 mg Oral TID PRN Orson Eva, MD   5 mg at 06/27/17 1527  . pantoprazole (PROTONIX) EC tablet 40 mg  40 mg Oral Daily Tat, David, MD   40 mg at 06/28/17 1130  . polyethylene glycol (MIRALAX / GLYCOLAX) packet 17 g  17 g Oral Daily Tat, David, MD   17 g at 06/28/17 1130  . senna-docusate (Senokot-S) tablet 1 tablet  1 tablet Oral QHS PRN Tat, David, MD      . tamsulosin (FLOMAX) capsule 0.4 mg  0.4 mg Oral QPC supper Tat, David, MD   0.4 mg at  06/27/17 1700  . vitamin C (ASCORBIC ACID) tablet 500 mg  500 mg Oral BID Orson Eva, MD   500 mg at 06/28/17 1131     Discharge Medications: Please see discharge summary for a list of discharge medications.  Relevant Imaging Results:  Relevant Lab Results:   Additional Information SSN: [id number]  Ihor Gully, LCSW

## 2017-06-29 DIAGNOSIS — N179 Acute kidney failure, unspecified: Secondary | ICD-10-CM | POA: Diagnosis not present

## 2017-06-29 DIAGNOSIS — I5032 Chronic diastolic (congestive) heart failure: Secondary | ICD-10-CM | POA: Diagnosis not present

## 2017-06-29 DIAGNOSIS — N183 Chronic kidney disease, stage 3 (moderate): Secondary | ICD-10-CM | POA: Diagnosis not present

## 2017-06-29 LAB — CBC
HEMATOCRIT: 29.3 % — AB (ref 39.0–52.0)
HEMOGLOBIN: 9 g/dL — AB (ref 13.0–17.0)
MCH: 26.2 pg (ref 26.0–34.0)
MCHC: 30.7 g/dL (ref 30.0–36.0)
MCV: 85.2 fL (ref 78.0–100.0)
PLATELETS: 265 10*3/uL (ref 150–400)
RBC: 3.44 MIL/uL — AB (ref 4.22–5.81)
RDW: 14.6 % (ref 11.5–15.5)
WBC: 7.7 10*3/uL (ref 4.0–10.5)

## 2017-06-29 LAB — BASIC METABOLIC PANEL
Anion gap: 8 (ref 5–15)
BUN: 57 mg/dL — ABNORMAL HIGH (ref 8–23)
CHLORIDE: 107 mmol/L (ref 98–111)
CO2: 23 mmol/L (ref 22–32)
CREATININE: 1.91 mg/dL — AB (ref 0.61–1.24)
Calcium: 10.4 mg/dL — ABNORMAL HIGH (ref 8.9–10.3)
GFR calc non Af Amer: 31 mL/min — ABNORMAL LOW (ref >60)
GFR, EST AFRICAN AMERICAN: 36 mL/min — AB (ref >60)
Glucose, Bld: 131 mg/dL — ABNORMAL HIGH (ref 70–99)
POTASSIUM: 4.4 mmol/L (ref 3.5–5.1)
Sodium: 138 mmol/L (ref 135–145)

## 2017-06-29 LAB — GLUCOSE, CAPILLARY
GLUCOSE-CAPILLARY: 120 mg/dL — AB (ref 70–99)
Glucose-Capillary: 131 mg/dL — ABNORMAL HIGH (ref 70–99)

## 2017-06-29 LAB — TSH: TSH: 1.482 u[IU]/mL (ref 0.350–4.500)

## 2017-06-29 MED ORDER — DOXYCYCLINE HYCLATE 100 MG PO TABS: 100.0000 mg | ORAL_TABLET | Freq: Two times a day (BID) | ORAL | 0 refills | Status: AC

## 2017-06-29 MED ORDER — INSULIN GLARGINE 100 UNIT/ML ~~LOC~~ SOLN: 5.0000 [IU] | Freq: Every day | SUBCUTANEOUS | 11 refills | Status: AC

## 2017-06-29 NOTE — Discharge Summary (Signed)
Physician Discharge Summary  [name] FYB:[id number] DOB: January 09, 1931 DOA: 06/25/2017  PCP: Hilbert Corrigan, MD  Admit date: 06/25/2017 Discharge date: 06/29/2017  Admitted From: SNF Disposition:  SNF  Recommendations for Outpatient Follow-up:  1. Follow up with PCP in 1-2 weeks 2. Please obtain BMP in one week     Discharge Condition: Stable CODE STATUS: DNR Diet recommendation: Heart Healthy / Carb Modified / Dysphagia / Regular   Brief/Interim Summary: 84 y.o.malewith medical history ofdCHF, COPD, CKD 3, HTN, CAD, DM2, HLD, BPH, persistent Afib on apixaban presented from [facility] due to abnormal labs and generalized weakness.Unfortunately, the patient is a poor historian. All of this history is obtained from review of the medical records.The patient was recently admitted to the hospital from 02/22/2017 through 02/27/2017 after mechanical fall resulting in a left femoral neck fracture. He underwent a left hemiarthroplasty at that time. Unfortunately, the patient was readmitted to the hospital on 03/04/17 to 3/15/19during which the patient was found to have MRSA bacteremia with infected left hip hardware. The patient was also treated for acute on chronic renal failure as well as acute metabolic encephalopathy during that admission. He was discharged to nursing facility to finish 6 weeks of vancomycin on 04/22/2017. He was subsequently switched over to Bactrim DS, 1 tablet twice daily for the next 3 to 6 months. The patient was seen by infectious disease, Dr. Effie Berkshire the office on 06/24/2017. The patient was continued on Bactrim DS although there have been concerns about worsening renal function on his current antibiotics. Nevertheless, blood work was performed at his nursing facility on 06/23/2017 which showed a hemoglobin of 6.8 with elevated serum creatinine on BMP. BMP at Dr. Shirley Friar showed a serum creatinine of 2.71. The patient himself complains of  some generalized weakness, but denies any headache, fevers, chills, chest pain, shortness breath, nausea, vomiting, diarrhea, abdominal pain. He has intermittent left hip pain. In fact, the patient does not know why he was brought to the hospital.  The patient will start IV fluids with gradual improvement of the patient's renal function and calcium.  The patient also improved clinically.  The patient was encouraged to increase oral intake of fluids.  Serum creatinine improved to 1.91 on the day of discharge.    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 -Discontinue Bactrim as discussed above -Starteddoxycycline for chronic suppression  Hyperkalemia -Kayexalate x 1 -resolved with IVF and kayexalate  Hypercalcemia -Likely due to hypercalcemia of immobility -presented with corrected calcium on 12.0 -Intact PTH--13 -IV fluids-->improving -25 vitamin D--pending -1,25 vitamin D--pending -TSH--1.482  Anemia CKD/iron deficiency anemia -06/23/2017 iron saturation 8%, ferritin 209 -Serum N27 782 -Folic acid 42.3 -baseline Hgb 7-8 -drop in Hgb due to dilution -nulecit x2 -FOBT neg -transfused2 units PRBC--Hgb remained stable after transfusion -Hgb 9.0 on day of d/c  chronic diastolic CHF -Clinically compensated -03/19/2017 echo EF 55 to 60%, no WMA, PASP 32 -Daily weights -06/09/2016 echocardiogram--EF 60-65%, mild MR, PASP 42 -Hesitated to useARB/ACEi in setting of CKD  3  Pyuria -obtain urine culture-->less than 10 K colonies  Persistent atrial fibrillation -Continue apixaban -  CHADSVASc =6   Diabetes mellitus type 2 -NovoLog sliding scale -06/23/2017 hemoglobin A1c 6.3 -CBGs controlled -decrease lantus to 5 units daily  COPD -Continue duo nebs -stable on RA  Essential hypertension -HoldingAmlodipine and hydralazine due to soft BP -BP remains acceptable  Depression -continue prozac and mirtazapine  Coronary artery disease -no chest pain -EKG--personally reviewed--no concerning ischemic changes     Discharge Instructions   Allergies as of 06/29/2017   No Known Allergies     Medication List    STOP taking these medications   amLODipine 10 MG tablet Commonly known as:  NORVASC   hydrALAZINE 10 MG tablet Commonly known as:  APRESOLINE   sulfamethoxazole-trimethoprim 800-160 MG tablet Commonly known as:  BACTRIM DS,SEPTRA DS     TAKE these medications   CERTAGEN PO Take 1 tablet by mouth daily.   cholecalciferol 400 units Tabs tablet Commonly known as:  VITAMIN D Take 400 Units by mouth daily.   docusate sodium 100 MG capsule Commonly known as:  COLACE Take 1 capsule (100 mg total) by mouth 2 (two) times daily.   doxycycline 100 MG tablet Commonly known as:  VIBRA-TABS Take 1 tablet (100 mg total) by mouth every 12 (twelve) hours.   ELIQUIS 5 MG Tabs tablet Generic drug:  apixaban Take 2.5 mg by mouth 2 (two) times daily.   feeding supplement (GLUCERNA SHAKE) Liqd Take 237 mLs by mouth 3 (three) times daily between meals. What changed:  when to take this   feeding supplement (PRO-STAT SUGAR FREE 64) Liqd Take 30 mLs by mouth 2 (two) times daily.   FLUoxetine 20 MG capsule Commonly known as:  PROZAC Take 20 mg by mouth at bedtime.   insulin aspart 100 UNIT/ML injection Commonly known as:  novoLOG Inject 0-9 Units into the skin 3 (three) times daily with meals. CBG < 70: implement hypoglycemia  protocol CBG 70 - 120: 0 units CBG 121 - 150: 1 unit CBG 151 - 200: 2 units CBG 201 - 250: 3 units CBG 251 - 300: 5 units CBG 301 - 350: 7 units CBG 351 - 400: 9 units CBG > 400: call MD.   insulin glargine 100 UNIT/ML injection Commonly known as:  LANTUS Inject 0.05 mLs (5 Units total) into the skin at bedtime. What changed:  how much to take   [facility] Crea Apply topically. Apply to legs and ankles topically daily as needed for dry skin   mirtazapine 7.5 MG tablet Commonly known as:  REMERON Take 7.5 mg by mouth at bedtime.   nitroGLYCERIN 0.4 MG SL tablet Commonly known as:  NITROSTAT Place 1 tablet (0.4 mg total) under the tongue every 5 (five) minutes as needed for chest pain.   oxyCODONE 5 MG immediate release tablet Commonly known as:  Oxy IR/ROXICODONE Take 1 tablet (5 mg total) by mouth 3 (three) times daily as needed for severe pain or breakthrough pain. What changed:  when to take this   pantoprazole 40 MG tablet Commonly known as:  PROTONIX Take 40 mg by mouth daily.   phenylephrine-shark liver oil-mineral oil-petrolatum 0.25-3-14-71.9 % rectal ointment Commonly known as:  PREPARATION H Place 1 application rectally daily.   polyethylene glycol packet Commonly known as:  MIRALAX / GLYCOLAX Take 17 g by mouth daily.   PROSCAR 5 MG tablet Generic drug:  finasteride Take 5 mg daily by mouth.   senna-docusate 8.6-50 MG tablet Commonly known as:  Senokot-S Take 1 tablet by mouth at bedtime as needed for mild  constipation. What changed:  when to take this   tamsulosin 0.4 MG Caps capsule Commonly known as:  FLOMAX Take 0.4 mg by mouth daily after supper.   vitamin C 500 MG tablet Commonly known as:  ASCORBIC ACID Take 500 mg by mouth 2 (two) times daily.       No Known Allergies  Consultations:  none   Procedures/Studies: US Renal  Result Date: 06/25/2017 CLINICAL DATA:  Acute on chronic renal failure EXAM: RENAL / URINARY TRACT ULTRASOUND  COMPLETE COMPARISON:  CT 11/14/2016, ultrasound 10/09/2016 FINDINGS: Right Kidney: Length: 11.7 cm. Echogenicity within normal limits. No mass or hydronephrosis visualized. Left Kidney: Length: 12 cm. Echogenicity within normal limits. No mass or hydronephrosis visualized. Bladder: Appears normal for degree of bladder distention.  Enlarged prostate. IMPRESSION: 1. Negative for hydronephrosis. Ultrasound appearance of the kidneys is within normal limits 2. Prostatomegaly Electronically Signed   By: Donavan Foil M.D.   On: 06/25/2017 18:21        Discharge Exam: Vitals:   06/28/17 2252 06/29/17 0639  BP: 116/69 (!) 148/61  Pulse: 74 86  Resp: 20 14  Temp: 97.8 F (36.6 C) 98.5 F (36.9 C)  SpO2: 99% 99%   Vitals:   06/28/17 0822 06/28/17 1422 06/28/17 2252 06/29/17 0639  BP:  125/64 116/69 (!) 148/61  Pulse:  82 74 86  Resp:  18 20 14   Temp:  98 F (36.7 C) 97.8 F (36.6 C) 98.5 F (36.9 C)  TempSrc:   Oral Oral  SpO2:  98% 99% 99%  Weight: 72.1 kg (158 lb 15.2 oz)     Height:        General: Pt is alert, awake, not in acute distress Cardiovascular: RRR, S1/S2 +, no rubs, no gallops Respiratory: CTA bilaterally, no wheezing, no rhonchi Abdominal: Soft, NT, ND, bowel sounds + Extremities: no edema, no cyanosis   The results of significant diagnostics from this hospitalization (including imaging, microbiology, ancillary and laboratory) are listed below for reference.    Significant Diagnostic Studies: US Renal  Result Date: 06/25/2017 CLINICAL DATA:  Acute on chronic renal failure EXAM: RENAL / URINARY TRACT ULTRASOUND COMPLETE COMPARISON:  CT 11/14/2016, ultrasound 10/09/2016 FINDINGS: Right Kidney: Length: 11.7 cm. Echogenicity within normal limits. No mass or hydronephrosis visualized. Left Kidney: Length: 12 cm. Echogenicity within normal limits. No mass or hydronephrosis visualized. Bladder: Appears normal for degree of bladder distention.  Enlarged prostate.  IMPRESSION: 1. Negative for hydronephrosis. Ultrasound appearance of the kidneys is within normal limits 2. Prostatomegaly Electronically Signed   By: Donavan Foil M.D.   On: 06/25/2017 18:21     Microbiology: Recent Results (from the past 240 hour(s))  MRSA PCR Screening     Status: Abnormal   Collection Time: 06/26/17  8:41 AM  Result Value Ref Range Status   MRSA by PCR POSITIVE (A) NEGATIVE Final    Comment:        The GeneXpert MRSA Assay (FDA approved for NASAL specimens only), is one component of a comprehensive MRSA colonization surveillance program. It is not intended to diagnose MRSA infection nor to guide or monitor treatment for MRSA infections. RESULT CALLED TO, READ BACK BY AND VERIFIED WITH: HOBBS @ 1354 ON [id number] BY HENDERSON L. Performed at [facility], [address] [place], [place] [address]   Culture, Urine     Status: Abnormal   Collection Time: 06/26/17  5:30 PM  Result Value Ref Range Status   Specimen Description   Final  URINE, CLEAN CATCH Performed at [facility], [address], [place], [place] [address]    Special Requests   Final    NONE Performed at [facility], [address], [place], [place] [address]    Culture (A)  Final    <10,000 COLONIES/mL INSIGNIFICANT GROWTH Performed at [place] [address] [place], [place] [address]    Report Status 06/28/2017 FINAL  Final     Labs: Basic Metabolic Panel: Recent Labs  Lab 06/25/17 1314 06/26/17 0509 06/27/17 0644 06/28/17 0357 06/29/17 0628  NA 131* 132* 134* 134* 138  K 5.4* 4.9 5.2* 5.3* 4.4  CL 98* 100* 102 102 107  CO2 24 23 22 23 23   GLUCOSE 140* 159* 103* 152* 131*  BUN 53* 51* 51* 57* 57*  CREATININE 2.66* 2.64* 2.31* 2.24* 1.91*  CALCIUM 11.4* 10.9* 11.1* 10.6* 10.4*  MG  --   --   --  1.9  --    Liver Function Tests: Recent Labs  Lab 06/24/17 1121 06/25/17 1314 06/26/17 0509 06/27/17 0644  AST 14 15 13* 13*  ALT 20 22 18 19   ALKPHOS  --   193* 171* 172*  BILITOT 0.3 0.3 0.4 0.6  PROT 6.8 7.6 6.7 7.0  ALBUMIN  --  3.2* 2.9* 3.1*   No results for input(s): LIPASE, AMYLASE in the last 168 hours. No results for input(s): AMMONIA in the last 168 hours. CBC: Recent Labs  Lab 06/25/17 1314 06/26/17 0509 06/27/17 0644 06/28/17 0357 06/29/17 0628  WBC 7.1 7.5 7.1 7.8 7.7  NEUTROABS 5.4  --   --   --   --   HGB 7.8* 6.9* 9.7* 9.2* 9.0*  HCT 25.1* 21.9* 30.6* 29.8* 29.3*  MCV 82.6 83.0 83.2 85.4 85.2  PLT 324 297 315 303 265   Cardiac Enzymes: No results for input(s): CKTOTAL, CKMB, CKMBINDEX, TROPONINI in the last 168 hours. BNP: Invalid input(s): POCBNP CBG: Recent Labs  Lab 06/28/17 1116 06/28/17 1642 06/28/17 2135 06/29/17 0729 06/29/17 1126  GLUCAP 157* 138* 166* 131* 120*    Time coordinating discharge:  36 minutes  Signed:  Orson Eva, DO [facility] Pager: [phone] 06/29/2017, 12:38 PM

## 2017-06-30 ENCOUNTER — Encounter: Payer: Self-pay | Admitting: Internal Medicine

## 2017-06-30 LAB — VITAMIN D 25 HYDROXY (VIT D DEFICIENCY, FRACTURES): VIT D 25 HYDROXY: 46.1 ng/mL (ref 30.0–100.0)

## 2017-06-30 LAB — CALCITRIOL (1,25 DI-OH VIT D): Vit D, 1,25-Dihydroxy: 12.1 pg/mL — ABNORMAL LOW (ref 19.9–79.3)

## 2017-06-30 LAB — PARATHYROID HORMONE, INTACT (NO CA): PTH: 13 pg/mL — ABNORMAL LOW (ref 15–65)

## 2017-07-09 ENCOUNTER — Encounter: Payer: Self-pay | Admitting: Gastroenterology

## 2017-07-09 ENCOUNTER — Ambulatory Visit (INDEPENDENT_AMBULATORY_CARE_PROVIDER_SITE_OTHER): Payer: Medicare Other | Admitting: Gastroenterology

## 2017-07-09 VITALS — BP 158/80 | HR 67 | Temp 97.1°F | Ht 71.0 in

## 2017-07-09 DIAGNOSIS — R1310 Dysphagia, unspecified: Secondary | ICD-10-CM | POA: Diagnosis not present

## 2017-07-09 NOTE — Assessment & Plan Note (Addendum)
84 year old gentleman with numerous medical problems presenting for "esophageal stricture".  Previous EGD in 2015 and 2018 with normal-appearing esophagus, dilated due to history of dysphagia.  Known age-related esophageal dysmotility on prior esophagram in 2015.  Modified barium swallow study done last year as outlined above.  Patient is unable to provide reliable history.  He denies dysphagia.  Requesting nursing home clinical staff to contact us with any information regarding "esophageal stricture".  It is unclear if there has been recent imaging demonstrating esophageal stricture that we do not have access to.  Also inquiring about witnessed dysphagia concerns.  Requesting weights as well.  Once we have this information further recommendations can be provided.  Continue pantoprazole.  As an aside, patient was supposed to have surveillance colonoscopy in 2016 as his bowel prep was poor in 2015. Given multiple comorbidities risks of surveillance colonoscopy likely outweigh the benefits at this time. Unless he develops GI bleeding or worsening anemia, other GI concerns, would hold off on colonoscopy.

## 2017-07-09 NOTE — Patient Instructions (Addendum)
1. Full office note to follow. 2. Please have clinical staff call with report of any concerns about current swallowing problems. Patient presented for "esophageal stricture" but patient denies problems swallowing. His history may be unreliable. He has had recent EGD 09/2016 with normal esophagus.  3. Please send weights from the last six months to fax [phone].

## 2017-07-09 NOTE — Progress Notes (Signed)
cc'ed to [facility]

## 2017-07-09 NOTE — Progress Notes (Signed)
Primary Care Physician:  Hilbert Corrigan, MD  Primary Gastroenterologist:  Garfield Cornea, MD   Chief Complaint  Patient presents with  . esophageal stricture    HPI:  [name] is a 84 y.o. male here for "esophageal stricture".  Office visit requested by Dr. Mal Amabile at [facility] facility.   Patient well-known to [place] from previous evaluation.  Most recently we saw him during hospitalization in September 2018.  During that hospitalization he had reported chronic dysphagia and underwent EGD on September 10 showing normal esophagus.  Esophagus was dilated given history of dysphagia.  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  He was also evaluated by speech therapy and had MBSS 10/2016 without significant abnormality but given inefficient mastication and lingual manipulation with dry solids it was recommended that he would probably tolerate soft and moist solids best.   He has known age-related esophageal dysmotility seen previously on barium pill esophagram in 2015.  Patient presents today, ? reliability of history.  He cannot recall what he had for breakfast this morning.  He is oriented to person and place.  He believes this February 1986 and Trump is the president.  He denies any problems swallowing pills, solids, liquids.  He states his appetite is good.  He thinks he may have some weight loss but he is unclear.  He denies abdominal pain, constipation, diarrhea, melena, rectal bleeding.  His only complaint is left leg pain.  He was unable to weigh today because he cannot stand.  Patient also has a history of attempted colonoscopy in 2015 but prep was poor.  He was supposed to come back in 1 year for follow-up colonoscopy but he never did.  He has had drastic decline in his health since then.  Hemoglobin chronically in 9 range but stable, likely anemia of chronic disease.   Current Outpatient Medications  Medication Sig Dispense Refill  . apixaban (ELIQUIS) 5  MG TABS tablet Take 2.5 mg by mouth 2 (two) times daily.     . cholecalciferol (VITAMIN D) 400 units TABS tablet Take 400 Units by mouth daily.    [name] docusate sodium (COLACE) 100 MG capsule Take 1 capsule (100 mg total) by mouth 2 (two) times daily.    [name] doxycycline (VIBRA-TABS) 100 MG tablet Take 1 tablet (100 mg total) by mouth every 12 (twelve) hours. 60 tablet 0  . finasteride (PROSCAR) 5 MG tablet Take 5 mg daily by mouth.    [name] FLUoxetine (PROZAC) 20 MG capsule Take 20 mg by mouth at bedtime.     . insulin aspart (NOVOLOG) 100 UNIT/ML injection Inject 0-9 Units into the skin 3 (three) times daily with meals. CBG < 70: implement hypoglycemia protocol CBG 70 - 120: 0 units CBG 121 - 150: 1 unit CBG 151 - 200: 2 units CBG 201 - 250: 3 units CBG 251 - 300: 5 units CBG 301 - 350: 7 units CBG 351 - 400: 9 units CBG > 400: call MD.    . insulin glargine (LANTUS) 100 UNIT/ML injection Inject 0.05 mLs (5 Units total) into the skin at bedtime. 10 mL 11  . insulin lispro (HUMALOG) 100 UNIT/ML injection Inject into the skin. Before meals with sliding scale    . mirtazapine (REMERON) 7.5 MG tablet Take 7.5 mg by mouth at bedtime.    . Multiple Vitamins-Minerals (CERTAGEN PO) Take 1 tablet by mouth daily.    . nitroGLYCERIN (NITROSTAT) 0.4 MG SL tablet Place 1 tablet (  0.4 mg total) under the tongue every 5 (five) minutes as needed for chest pain. 30 tablet 12  . oxyCODONE (OXY IR/ROXICODONE) 5 MG immediate release tablet Take 1 tablet (5 mg total) by mouth 3 (three) times daily as needed for severe pain or breakthrough pain. (Patient taking differently: Take 5 mg by mouth 4 (four) times daily. ) 10 tablet 0  . pantoprazole (PROTONIX) 40 MG tablet Take 40 mg by mouth daily.    . phenylephrine-shark liver oil-mineral oil-petrolatum (PREPARATION H) 0.25-3-14-71.9 % rectal ointment Place 1 application rectally daily.    . polyethylene glycol (MIRALAX / GLYCOLAX) packet Take 17 g by mouth daily.    [name]  senna-docusate (SENOKOT-S) 8.6-50 MG tablet Take 1 tablet by mouth at bedtime as needed for mild constipation. (Patient taking differently: Take 1 tablet by mouth daily as needed for mild constipation. )    . Skin Protectants, Misc. (MINERIN) CREA Apply topically. Apply to legs and ankles topically daily as needed for dry skin    . tamsulosin (FLOMAX) 0.4 MG CAPS capsule Take 0.4 mg by mouth daily after supper.     . vitamin C (ASCORBIC ACID) 500 MG tablet Take 500 mg by mouth 2 (two) times daily.     No current facility-administered medications for this visit.     Allergies as of 07/09/2017  . (No Known Allergies)    Past Medical History:  Diagnosis Date  . BPH (benign prostatic hyperplasia)   . CAD (coronary artery disease)    Multivessel status post CABG 2011 - LIMA to LAD, SVG to diagonal, SVG to OM, SVG to PDA  . Cellulitis    12/15  . Chronic back pain   . Chronic diastolic CHF (congestive heart failure) ([place])   . CKD (chronic kidney disease), stage III ([place])   . Closed hip fracture ([place]) 07/3708   complicated by MRSA   . COPD (chronic obstructive pulmonary disease) ([place])   . Essential hypertension   . Gastric mass    EGD 9/15  . GERD (gastroesophageal reflux disease)   . History of DVT (deep vein thrombosis)    Postphlebitic syndrome  . History of kidney stones   . HOH (hard of hearing)   . Hx of CABG   . Hyperlipidemia   . Persistent atrial fibrillation ([place])    a. s/p DCCV 03/2016.  [name] Sleep apnea    Stop Bang score of 5  . Type 2 diabetes mellitus ([place])   . UTI (urinary tract infection) 11/2016    Past Surgical History:  Procedure Laterality Date  . AMPUTATION Left 10/07/2016   Procedure: AMPUTATION BELOW KNEE;  Surgeon: [name], MD;  Location: [facility];  Service: Orthopedics;  Laterality: Left;  . CARDIOVERSION N/A 03/20/2016   Procedure: CARDIOVERSION;  Surgeon: Arnoldo Lenis, MD;  [facility];  Service: Endoscopy;  Laterality: N/A;  .  CATARACT EXTRACTION W/PHACO Left 02/07/2016   Procedure: CATARACT EXTRACTION PHACO AND INTRAOCULAR LENS PLACEMENT (IOC);  Surgeon: [name], MD;  Location: [facility];  Service: Ophthalmology;  Laterality: Left;  CDE:  23.13  . COLONOSCOPY  2004   Dr. Laural Golden: three small polyps at cecum, path unknown, external hemorrhoids  . COLONOSCOPY N/A 08/16/2013   Dr. [name]: incomplete prep. multiple tubular adenomas, multiple biopsies. Needs surveillance in Aug 2016 due to poor prep  . CORONARY ARTERY BYPASS GRAFT     x5  . CORONARY ARTERY BYPASS GRAFT  2011  . CYSTOSCOPY N/A 12/12/2012  Procedure: CYSTOSCOPY FLEXIBLE;  Surgeon: Marissa Nestle, MD;  Location: [facility];  Service: Urology;  Laterality: N/A;  . ESOPHAGEAL DILATION N/A 09/14/2016   Procedure: ESOPHAGEAL DILATION;  Surgeon: Daneil Dolin, MD;  [facility];  Service: Endoscopy;  Laterality: N/A;  . ESOPHAGOGASTRODUODENOSCOPY N/A 08/16/2013   Dr. [name]: normal esophagus s/p Maloney dilation, gastric erosions, submucosal gastric mass vs extrinsic mass  . ESOPHAGOGASTRODUODENOSCOPY (EGD) WITH PROPOFOL N/A 09/14/2016   Dr. Buford Dresser: Normal esophagus status post dilation.  Gastric lipoma chronic finding.  . EUS N/A 09/07/2013   Dr. 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 HIP ARTHROPLASTY Left 02/24/2017   Procedure: ARTHROPLASTY POSTERIOR (HEMIARTHROPLASTY);  Surgeon: [name], MD;  Location: [facility];  Service: Orthopedics;  Laterality: Left;  . INCISION AND DRAINAGE ABSCESS N/A 12/20/2013   Procedure: INCISION AND DRAINAGE ABSCESS NECK;  Surgeon: Jamesetta So, MD;  Location: [facility];  Service: General;  Laterality: N/A;  . INCISION AND DRAINAGE ABSCESS Left 09/04/2016   Procedure: INCISION AND DRAINAGE ABSCESS;  Surgeon: Aviva Signs, MD;  Location: [facility];  Service: General;  Laterality: Left;  . INCISION AND DRAINAGE HIP Left 03/05/2017   Procedure: LEFT HIP IRRIGATION AND DEBRIDEMENT AND PLACEMENT OF ANTIBIOTIC  BEADS;  Surgeon: [name], MD;  Location: [facility];  Service: Orthopedics;  Laterality: Left;  . INCISION AND DRAINAGE HIP Left 03/10/2017   Procedure: REPEAT IRRIGATION AND DEBRIDEMENT LEFT DEEP HIP INFECTION, ANTIBIOTIC BEAD PLACEMENT, [place];  Surgeon: [name], MD;  Location: [facility];  Service: Orthopedics;  Laterality: Left;  [name] MALONEY DILATION N/A 08/16/2013   Procedure: Venia Minks DILATION;  Surgeon: Daneil Dolin, MD;  [facility];  Service: Endoscopy;  Laterality: N/A;    Family History  Problem Relation Age of Onset  . Colon cancer Son 31       deceased        ROS: unreliable but he reports the following  General: Negative for anorexia, fever, chills, fatigue, weakness. Eyes: Negative for vision changes.  ENT: Negative for hoarseness, difficulty swallowing , nasal congestion. CV: Negative for chest pain, angina, palpitations, dyspnea on exertion, peripheral edema.  Respiratory: Negative for dyspnea at rest, dyspnea on exertion, cough, sputum, wheezing.  GI: See history of present illness. GU:  Negative for dysuria, hematuria, urinary incontinence, urinary frequency, nocturnal urination.  MS: +left leg pain.  Derm: Negative for rash or itching.  Neuro: Negative for weakness, abnormal sensation, seizure, frequent headaches, memory loss, confusion.  Psych: Negative for anxiety, depression, suicidal ideation, hallucinations.  Endo: Negative for unusual weight change.  Heme: Negative for bruising or bleeding. Allergy: Negative for rash or hives.    Physical Examination:  BP (!) 158/80   Pulse 67   Temp (!) 97.1 F (36.2 C) (Oral)   Ht 5\' 11"  (1.803 m)   BMI 22.17 kg/m    General: Well-nourished, well-developed in no acute distress.  Head: Normocephalic, atraumatic.   Eyes: Conjunctiva pink, no icterus. Mouth: Oropharyngeal mucosa moist and pink , no lesions erythema or exudate. Neck: Supple without thyromegaly, masses, or lymphadenopathy.   Lungs: Clear to auscultation bilaterally.  Heart: Regular rate and irregularly irregular rhythm, no murmurs rubs or gallops.  Abdomen: Bowel sounds are normal, nontender, nondistended.   Rectal: not performed Extremities: left BKA. Right lower extremity without edema. No clubbing or deformities.  Neuro: Alert and oriented to person and place only.   Skin: Warm and dry,  no rash or jaundice.   Psych: Alert and cooperative, normal mood and affect.  Labs: Lab Results  Component Value Date   CREATININE 1.91 (H) 06/29/2017   BUN 57 (H) 06/29/2017   NA 138 06/29/2017   K 4.4 06/29/2017   CL 107 06/29/2017   CO2 23 06/29/2017   Lab Results  Component Value Date   WBC 7.7 06/29/2017   HGB 9.0 (L) 06/29/2017   HCT 29.3 (L) 06/29/2017   MCV 85.2 06/29/2017   PLT 265 06/29/2017   Lab Results  Component Value Date   ALT 19 06/27/2017   AST 13 (L) 06/27/2017   ALKPHOS 172 (H) 06/27/2017   BILITOT 0.6 06/27/2017   Lab Results  Component Value Date   IRON 24 (L) 10/07/2016   TIBC 284 10/07/2016   FERRITIN 77 10/07/2016     Imaging Studies: US Renal  Result Date: 06/25/2017 CLINICAL DATA:  Acute on chronic renal failure EXAM: RENAL / URINARY TRACT ULTRASOUND COMPLETE COMPARISON:  CT 11/14/2016, ultrasound 10/09/2016 FINDINGS: Right Kidney: Length: 11.7 cm. Echogenicity within normal limits. No mass or hydronephrosis visualized. Left Kidney: Length: 12 cm. Echogenicity within normal limits. No mass or hydronephrosis visualized. Bladder: Appears normal for degree of bladder distention.  Enlarged prostate. IMPRESSION: 1. Negative for hydronephrosis. Ultrasound appearance of the kidneys is within normal limits 2. Prostatomegaly Electronically Signed   By: Donavan Foil M.D.   On: 06/25/2017 18:21

## 2017-07-09 NOTE — Progress Notes (Signed)
cc'ed to [facility]

## 2017-07-09 NOTE — Progress Notes (Signed)
Send copy to [facility].

## 2017-07-13 DIAGNOSIS — R109 Unspecified abdominal pain: Secondary | ICD-10-CM

## 2017-10-25 ENCOUNTER — Ambulatory Visit: Payer: Medicare Other | Admitting: Internal Medicine

## 2018-05-06 DEATH — deceased

## 2019-01-11 IMAGING — DX DG CHEST 2V
2 series · 2 of 2 positions shown · non-contrast
Comparison: PA and lateral chest x-ray August 20, 2016

CLINICAL DATA: Shortness of breath, hypertension, history of
coronary artery disease and previous COPD. Remote history of
smoking.

EXAM:
CHEST  2 VIEW

[chest lat]
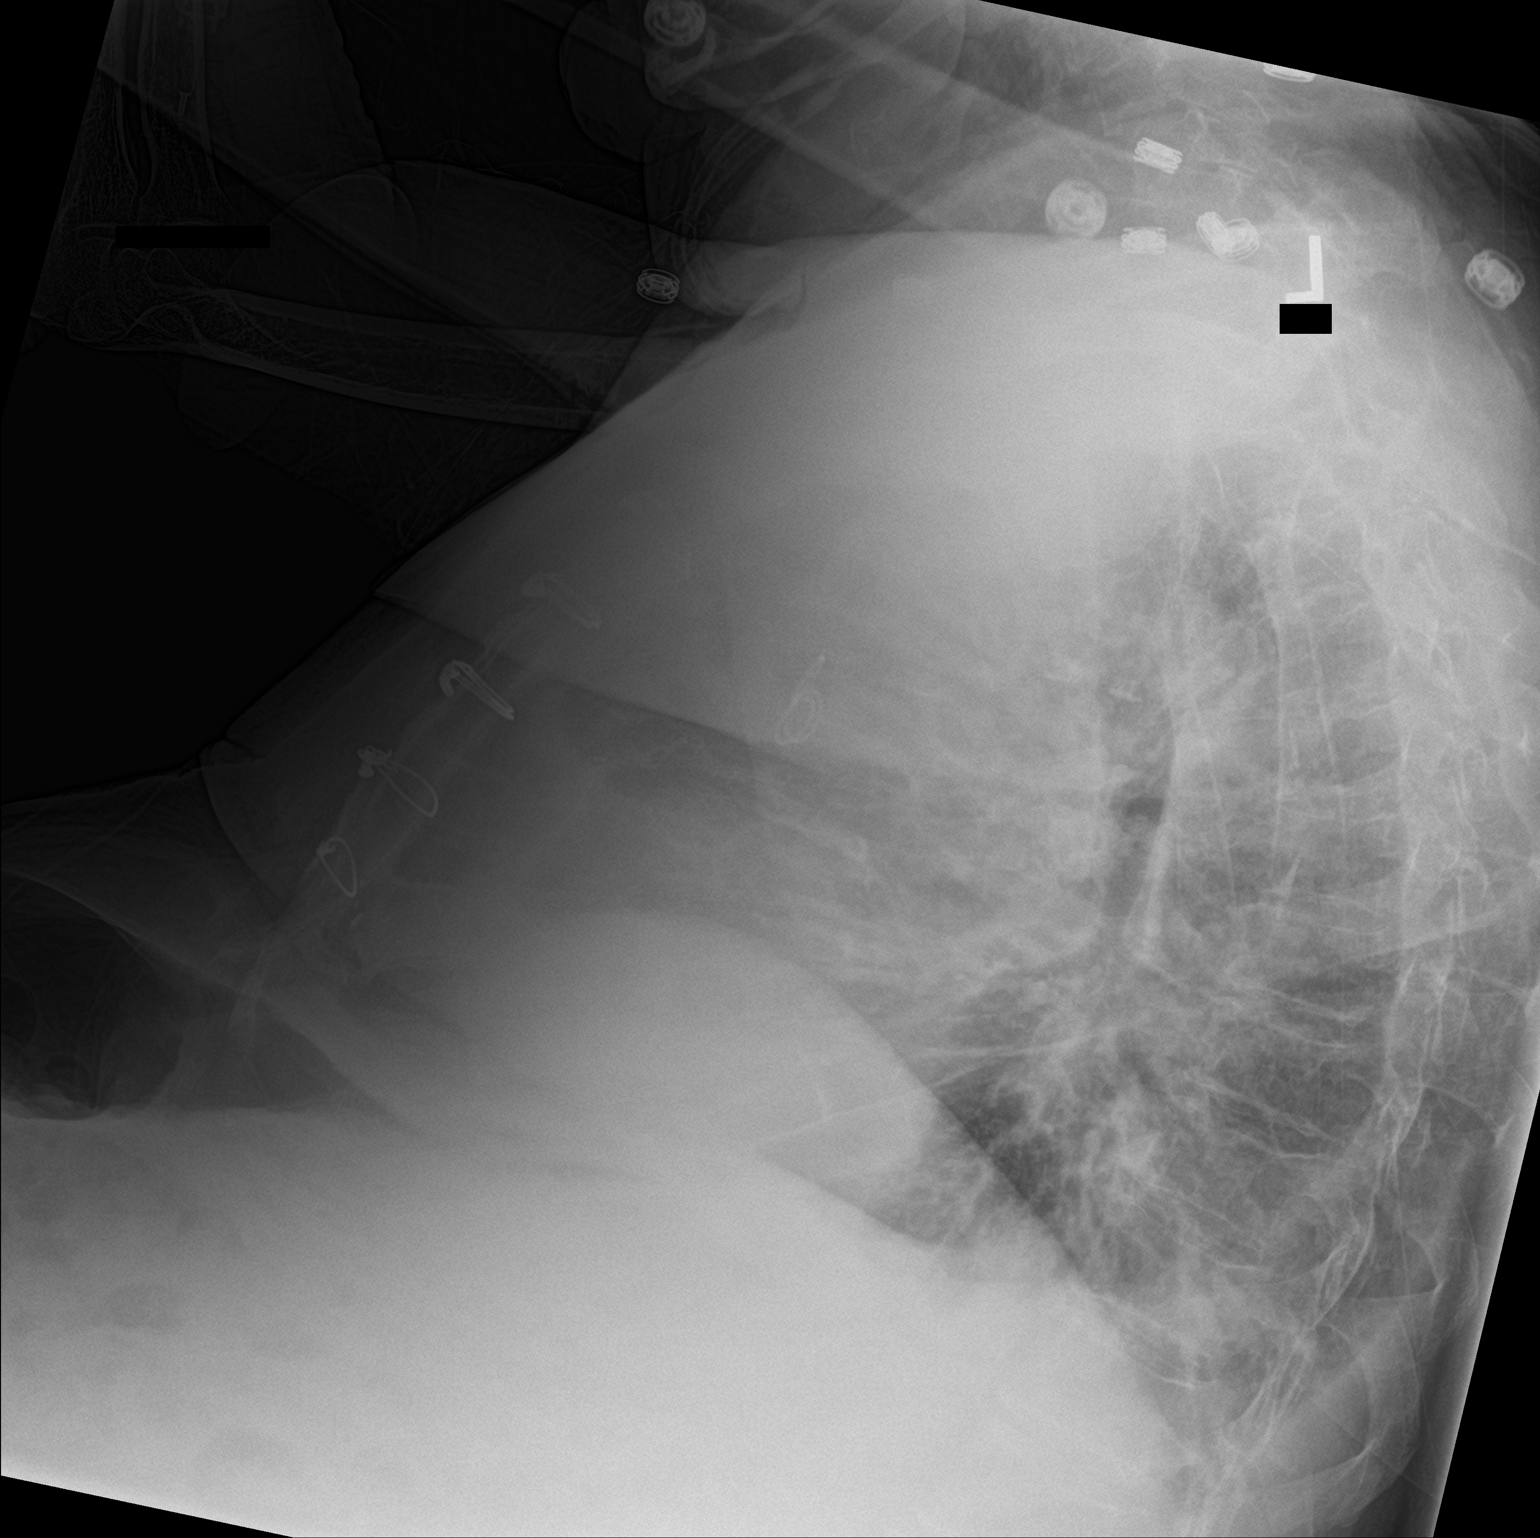

[chest ap]
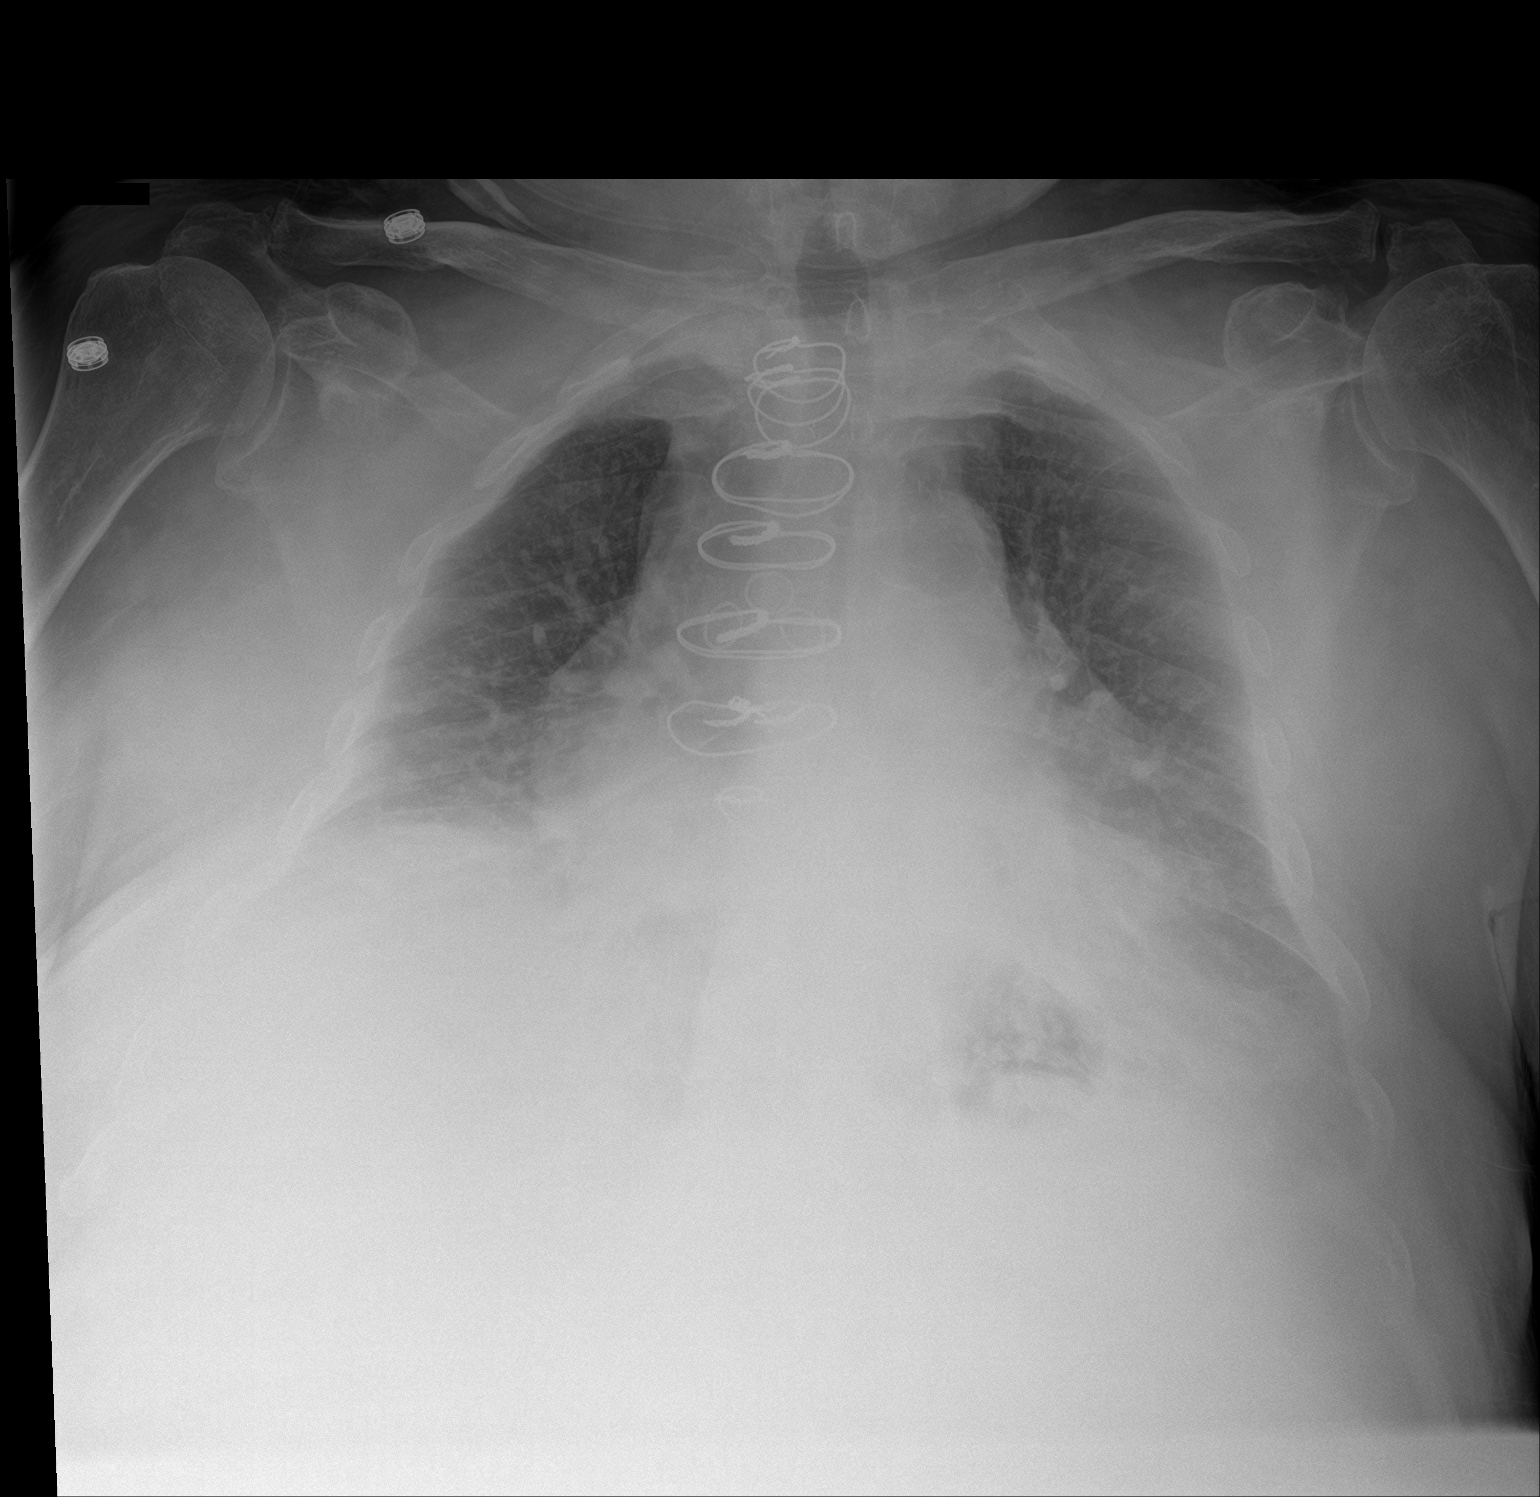

[2 of 2 positions shown; findings below may reference images not displayed]

FINDINGS: Today's study is obtained in a lordotic fashion. The lungs are
reasonably well inflated. The interstitial markings remain
increased. The cardiac silhouette remains enlarged. The pulmonary
vascularity is slightly less engorged. There is calcification in the
wall of the aortic arch. The sternal wires are intact. A small
amount of pleural fluid is likely present on the right.
IMPRESSION: CHF with mild interstitial edema stable to slightly improved since
the previous study.

Previous CABG.  Thoracic aortic atherosclerosis.

## 2019-01-16 IMAGING — DX DG FOOT COMPLETE 3+V*L*
3 series · 3 of 3 positions shown · non-contrast
Comparison: 08/20/2016

CLINICAL DATA: Left foot redness.

EXAM:
LEFT FOOT - COMPLETE 3+ VIEW

[foot ap]
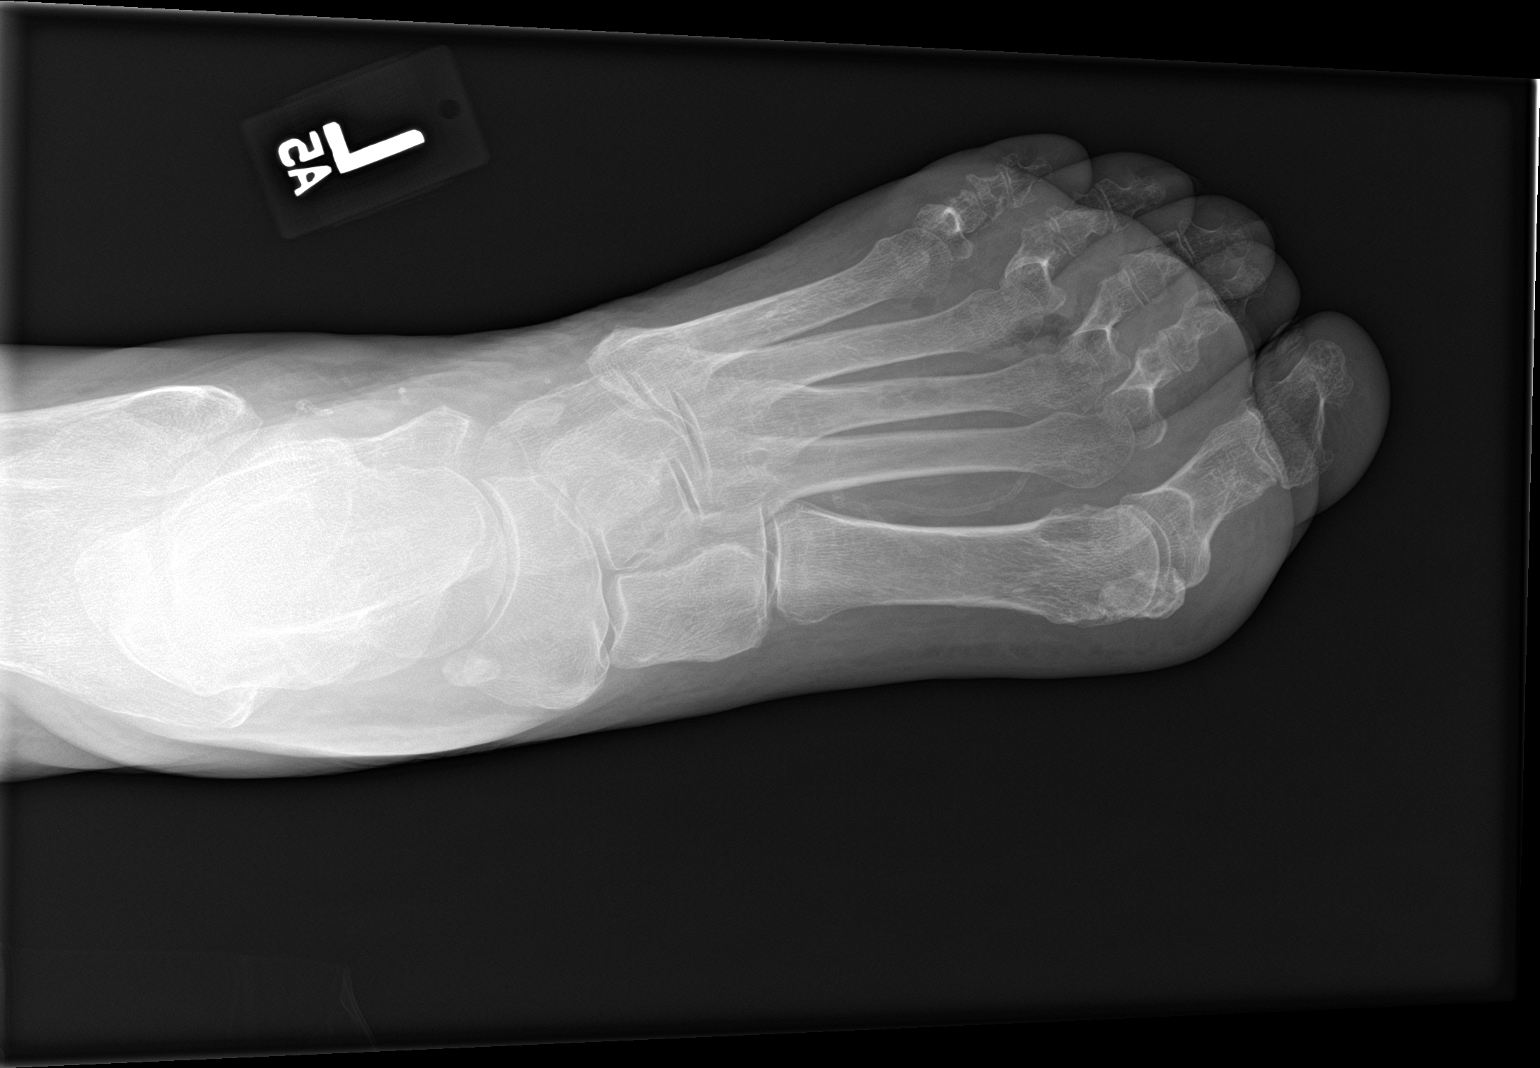

[foot obl]
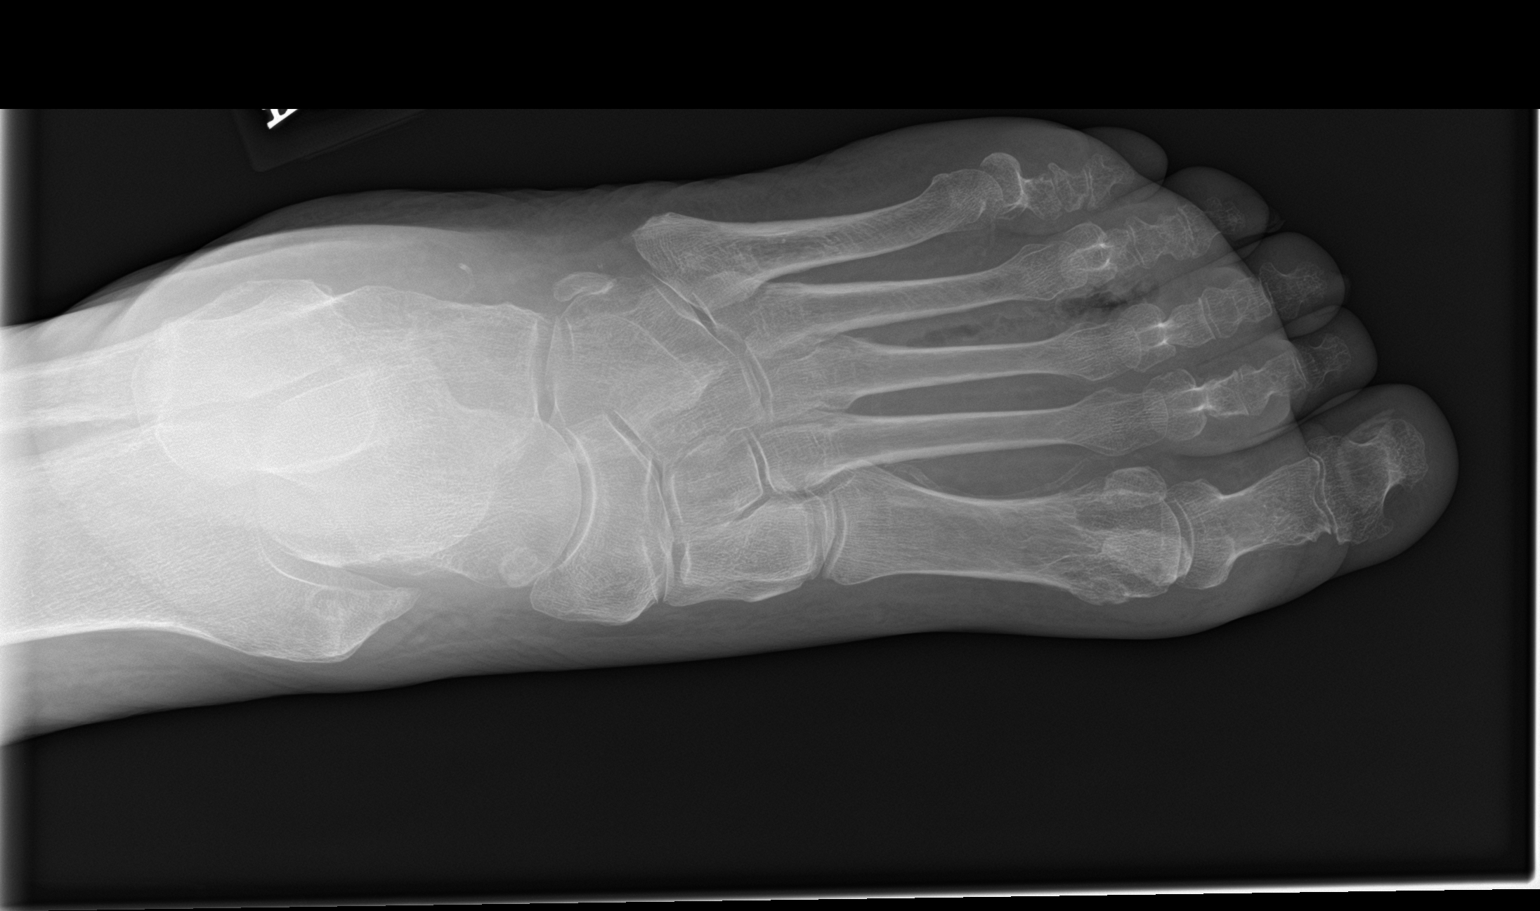

[foot lat]
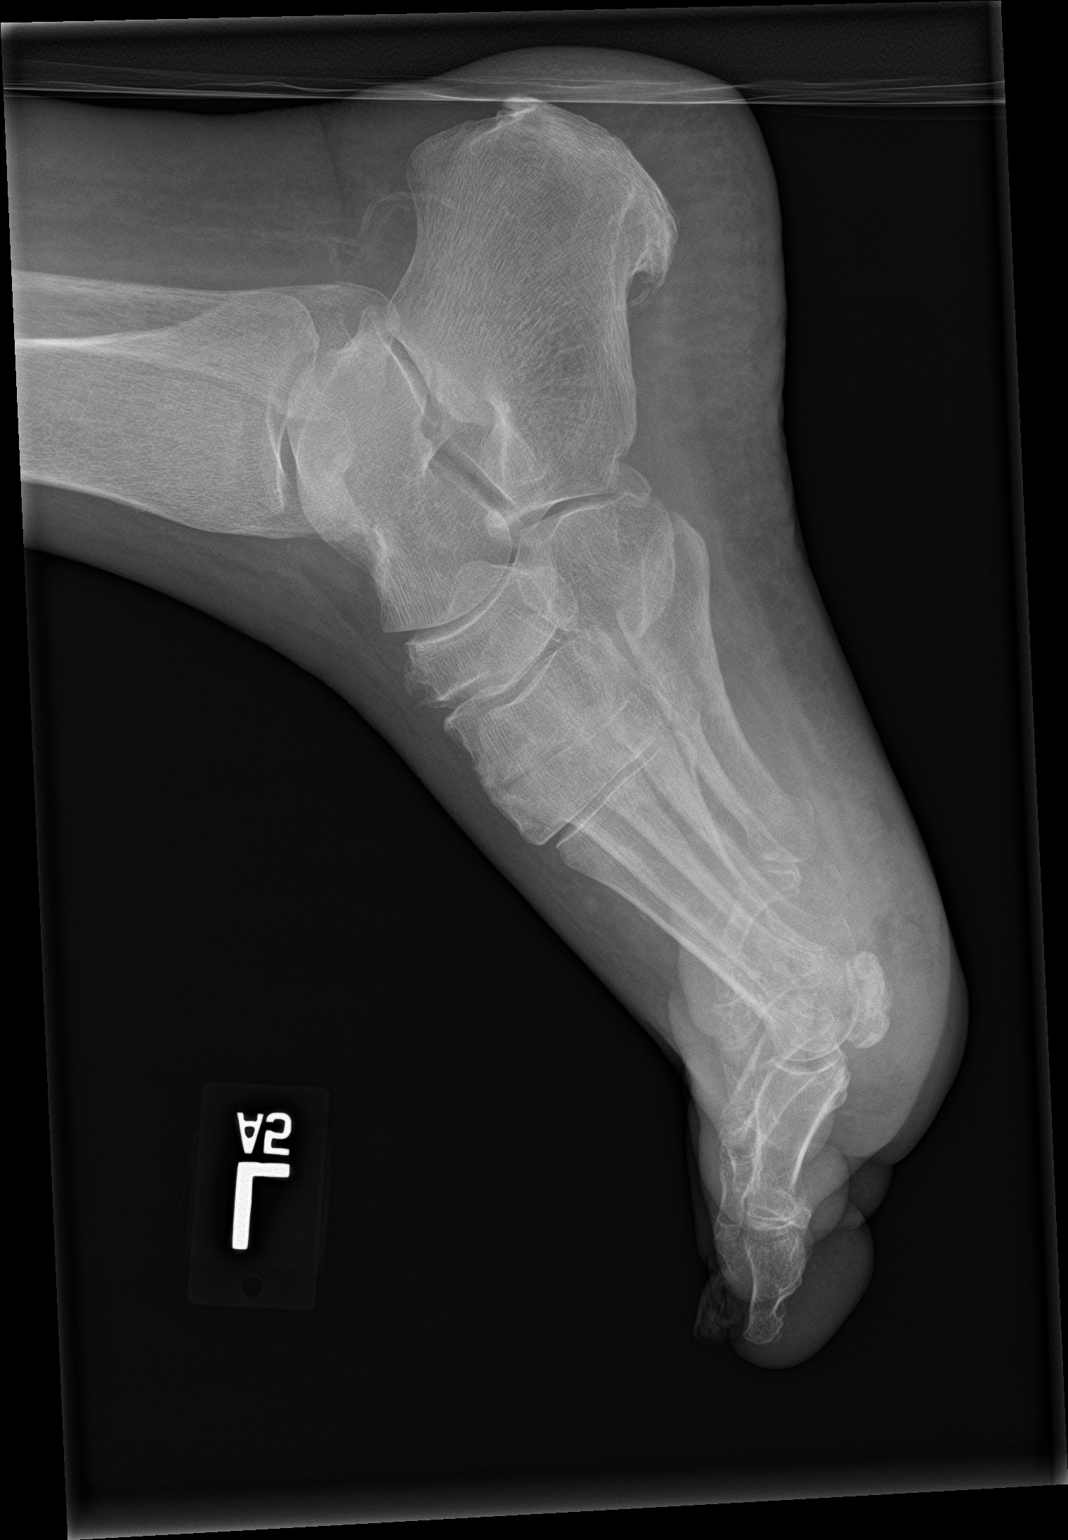

[3 of 3 positions shown; findings below may reference images not displayed]

FINDINGS: There is no evidence of fracture or dislocation. There is no
periosteal reaction or bone destruction. There soft tissue emphysema
between the third and fourth metatarsals concerning for infection.
IMPRESSION: 1. No evidence of osteomyelitis of the left foot.
2. Soft tissue emphysema between the third and fourth metatarsals
concerning for necrotizing infection.
